# Patient Record
Sex: Female | Born: 1965
Health system: Southern US, Community
[De-identification: ages and names within clinical notes are randomized; demographics above are authoritative.]

## PROBLEM LIST (undated history)

## (undated) ENCOUNTER — Emergency Department (HOSPITAL_COMMUNITY): Admission: EM | Payer: MEDICAID | Source: Home / Self Care

## (undated) DIAGNOSIS — T7840XA Allergy, unspecified, initial encounter: Secondary | ICD-10-CM

## (undated) DIAGNOSIS — F419 Anxiety disorder, unspecified: Secondary | ICD-10-CM

## (undated) DIAGNOSIS — J439 Emphysema, unspecified: Secondary | ICD-10-CM

## (undated) DIAGNOSIS — J449 Chronic obstructive pulmonary disease, unspecified: Secondary | ICD-10-CM

## (undated) DIAGNOSIS — I10 Essential (primary) hypertension: Secondary | ICD-10-CM

## (undated) DIAGNOSIS — G9389 Other specified disorders of brain: Secondary | ICD-10-CM

## (undated) DIAGNOSIS — C801 Malignant (primary) neoplasm, unspecified: Secondary | ICD-10-CM

## (undated) HISTORY — DX: Emphysema, unspecified: J43.9

## (undated) HISTORY — PX: OTHER SURGICAL HISTORY: SHX169

## (undated) HISTORY — DX: Allergy, unspecified, initial encounter: T78.40XA

## (undated) HISTORY — DX: Chronic obstructive pulmonary disease, unspecified: J44.9

---

## 2000-07-06 ENCOUNTER — Encounter: Payer: Self-pay | Admitting: Emergency Medicine

## 2000-07-06 ENCOUNTER — Emergency Department (HOSPITAL_COMMUNITY): Admission: EM | Admit: 2000-07-06 | Discharge: 2000-07-06 | Payer: Self-pay | Admitting: Emergency Medicine

## 2007-04-17 ENCOUNTER — Emergency Department (HOSPITAL_COMMUNITY): Admission: EM | Admit: 2007-04-17 | Discharge: 2007-04-17 | Payer: Self-pay | Admitting: Emergency Medicine

## 2007-04-19 ENCOUNTER — Emergency Department (HOSPITAL_COMMUNITY): Admission: EM | Admit: 2007-04-19 | Discharge: 2007-04-19 | Payer: Self-pay | Admitting: Family Medicine

## 2013-05-10 ENCOUNTER — Ambulatory Visit: Payer: Self-pay

## 2013-06-06 ENCOUNTER — Encounter: Payer: Self-pay | Admitting: Internal Medicine

## 2013-06-06 ENCOUNTER — Ambulatory Visit: Payer: No Typology Code available for payment source | Attending: Family Medicine | Admitting: Internal Medicine

## 2013-06-06 VITALS — BP 134/82 | HR 71 | Temp 98.2°F | Resp 16 | Ht 67.0 in | Wt 117.0 lb

## 2013-06-06 DIAGNOSIS — K0889 Other specified disorders of teeth and supporting structures: Secondary | ICD-10-CM | POA: Insufficient documentation

## 2013-06-06 DIAGNOSIS — K089 Disorder of teeth and supporting structures, unspecified: Secondary | ICD-10-CM | POA: Insufficient documentation

## 2013-06-06 MED ORDER — IBUPROFEN 600 MG PO TABS: 600.0000 mg | ORAL_TABLET | Freq: Three times a day (TID) | ORAL | Status: DC | PRN

## 2013-06-06 MED ORDER — AMOXICILLIN-POT CLAVULANATE 875-125 MG PO TABS: 1.0000 | ORAL_TABLET | Freq: Two times a day (BID) | ORAL | Status: DC

## 2013-06-06 NOTE — Progress Notes (Signed)
Patient ID: Regina [name], female   DOB: December 14, 1965, 47 y.o.   MRN: [id number] Patient Demographics  Regina [name], is a 47 y.o. female  Regina [name]  NUU:[id number]  DOB - 29-Sep-1965  Chief Complaint  Patient presents with  . Establish Care        Subjective:   Regina [name] today is here to establish primary care. Patient has No headache, No chest pain, No abdominal pain - No Nausea, No new weakness tingling or numbness, No Cough - SOB.  Patient is a 47 year old female who has no medical problems, states that she is in good physical health, needs a dental referral, pain and loose first lower left lower premolar.  Objective:    Filed Vitals:   06/06/13 0909  BP: 134/82  Pulse: 71  Temp: 98.2 F (36.8 C)  TempSrc: Oral  Resp: 16  Height: 5\' 7"  (1.702 m)  Weight: 117 lb (53.071 kg)  SpO2: 100%     ALLERGIES:  Not on File  PAST MEDICAL HISTORY: No past medical history on file.  PAST SURGICAL HISTORY: No past surgical history on file.  FAMILY HISTORY: No family history on file.  MEDICATIONS AT HOME: Prior to Admission medications   Medication Sig Start Date End Date Taking? Authorizing Provider  amoxicillin-clavulanate (AUGMENTIN) 875-125 MG per tablet Take 1 tablet by mouth 2 (two) times daily. 06/06/13   Ripudeep Jenna Luo, MD  ibuprofen (ADVIL,MOTRIN) 600 MG tablet Take 1 tablet (600 mg total) by mouth every 8 (eight) hours as needed for pain. 06/06/13   Ripudeep Jenna Luo, MD    REVIEW OF SYSTEMS:  Constitutional:   No   Fevers, chills, fatigue.  HEENT:    No headaches, Sore throat, odontalgia+  Cardio-vascular: No chest pain,  Orthopnea, swelling in lower extremities, anasarca, palpitations  GI:  No abdominal pain, nausea, vomiting, diarrhea  Resp: No shortness of breath,  No coughing up of blood.No cough.No wheezing.  Skin:  no rash or lesions.  GU:  no dysuria, change in color of urine, no urgency or frequency.  No flank  pain.  Musculoskeletal: No joint pain or swelling.  No decreased range of motion.  No back pain.  Psych: No change in mood or affect. No depression or anxiety.  No memory loss.   Exam  General appearance :Awake, alert, NAD, Speech Clear. HEENT: Atraumatic and Normocephalic, PERLA poor oral hygiene, dental caries, loose left lower first premolar Neck: supple, no JVD. No cervical lymphadenopathy.  Chest: clear to auscultation bilaterally, no wheezing, rales or rhonchi CVS: S1 S2 regular, no murmurs.  Abdomen: soft, NBS, NT, ND, no gaurding, rigidity or rebound. Extremities: No cyanosis, clubbing, B/L Lower Ext shows no edema,  Neurology: Awake alert, and oriented X 3, CN II-XII intact, Non focal Skin:No Rash or lesions Wounds: N/A    Data Review   Basic Metabolic Panel: No results found for this basename: NA, K, CL, CO2, GLUCOSE, BUN, CREATININE, CALCIUM, MG, PHOS,  in the last 168 hours Liver Function Tests: No results found for this basename: AST, ALT, ALKPHOS, BILITOT, PROT, ALBUMIN,  in the last 168 hours  CBC: No results found for this basename: WBC, NEUTROABS, HGB, HCT, MCV, PLT,  in the last 168 hours ------------------------------------------------------------------------------------------------------------------ No results found for this basename: HGBA1C,  in the last 72 hours ------------------------------------------------------------------------------------------------------------------ No results found for this basename: CHOL, HDL, LDLCALC, TRIG, CHOLHDL, LDLDIRECT,  in the last 72 hours ------------------------------------------------------------------------------------------------------------------ No results found for this basename: TSH, T4TOTAL, FREET3, T3FREE, THYROIDAB,  in  the last 72 hours ------------------------------------------------------------------------------------------------------------------ No results found for this basename: VITAMINB12, FOLATE,  FERRITIN, TIBC, IRON, RETICCTPCT,  in the last 72 hours  Coagulation profile  No results found for this basename: INR, PROTIME,  in the last 168 hours    Assessment & Plan   Active Problems: Dental pain - Placed on Augmentin for 10 days, pain control with ibuprofen - Urgent ambulatory referral to dentistry    Health screening - Patient had flu shot last week - States had Pap smear 3 months ago - Mammogram screening ordered Labs for CBC, BMET, lipid panel for next visit ordered - She has eye appointment today at 10:30AM   Follow-up in 3 months   RAI,RIPUDEEP M.D. 06/06/2013, 9:15 AM

## 2013-06-06 NOTE — Progress Notes (Signed)
Pt is here to establish care. Pt is requesting a referral to a dentist.  Pt reports to be in great health.

## 2013-07-12 ENCOUNTER — Ambulatory Visit: Payer: No Typology Code available for payment source

## 2013-08-15 ENCOUNTER — Ambulatory Visit: Payer: No Typology Code available for payment source

## 2013-08-28 ENCOUNTER — Ambulatory Visit: Payer: No Typology Code available for payment source

## 2013-11-23 ENCOUNTER — Ambulatory Visit: Payer: Self-pay

## 2014-05-16 ENCOUNTER — Emergency Department (HOSPITAL_COMMUNITY)
Admission: EM | Admit: 2014-05-16 | Discharge: 2014-05-17 | Disposition: A | Discharge status: Home / Self Care | Payer: No Typology Code available for payment source | Attending: Emergency Medicine | Admitting: Emergency Medicine

## 2014-05-16 ENCOUNTER — Encounter (HOSPITAL_COMMUNITY): Payer: Self-pay | Admitting: Emergency Medicine

## 2014-05-16 ENCOUNTER — Emergency Department (HOSPITAL_COMMUNITY): Payer: No Typology Code available for payment source

## 2014-05-16 DIAGNOSIS — S139XXA Sprain of joints and ligaments of unspecified parts of neck, initial encounter: Secondary | ICD-10-CM | POA: Insufficient documentation

## 2014-05-16 DIAGNOSIS — Y9289 Other specified places as the place of occurrence of the external cause: Secondary | ICD-10-CM | POA: Insufficient documentation

## 2014-05-16 DIAGNOSIS — S060X0A Concussion without loss of consciousness, initial encounter: Secondary | ICD-10-CM | POA: Diagnosis not present

## 2014-05-16 DIAGNOSIS — S161XXA Strain of muscle, fascia and tendon at neck level, initial encounter: Secondary | ICD-10-CM

## 2014-05-16 DIAGNOSIS — Y9389 Activity, other specified: Secondary | ICD-10-CM | POA: Insufficient documentation

## 2014-05-16 DIAGNOSIS — IMO0002 Reserved for concepts with insufficient information to code with codable children: Secondary | ICD-10-CM | POA: Diagnosis present

## 2014-05-16 MED ORDER — OXYCODONE-ACETAMINOPHEN 5-325 MG PO TABS
1.0000 | ORAL_TABLET | Freq: Once | ORAL | Status: AC
  Administered 2014-05-16: 1 via ORAL
  Filled 2014-05-16: qty 1
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
## 2014-05-17 ENCOUNTER — Encounter (HOSPITAL_COMMUNITY): Payer: Self-pay | Admitting: Emergency Medicine

## 2014-05-17 ENCOUNTER — Emergency Department (HOSPITAL_COMMUNITY): Payer: No Typology Code available for payment source

## 2014-05-17 ENCOUNTER — Emergency Department (HOSPITAL_COMMUNITY)
Admission: EM | Admit: 2014-05-17 | Discharge: 2014-05-17 | Disposition: A | Discharge status: Home / Self Care | Payer: No Typology Code available for payment source | Attending: Emergency Medicine | Admitting: Emergency Medicine

## 2014-05-17 DIAGNOSIS — Y92410 Unspecified street and highway as the place of occurrence of the external cause: Secondary | ICD-10-CM | POA: Insufficient documentation

## 2014-05-17 DIAGNOSIS — S060X0A Concussion without loss of consciousness, initial encounter: Secondary | ICD-10-CM | POA: Insufficient documentation

## 2014-05-17 DIAGNOSIS — S0990XA Unspecified injury of head, initial encounter: Secondary | ICD-10-CM | POA: Insufficient documentation

## 2014-05-17 DIAGNOSIS — Z791 Long term (current) use of non-steroidal anti-inflammatories (NSAID): Secondary | ICD-10-CM | POA: Diagnosis not present

## 2014-05-17 DIAGNOSIS — M79605 Pain in left leg: Secondary | ICD-10-CM

## 2014-05-17 DIAGNOSIS — S99929A Unspecified injury of unspecified foot, initial encounter: Secondary | ICD-10-CM | POA: Diagnosis not present

## 2014-05-17 DIAGNOSIS — S8990XA Unspecified injury of unspecified lower leg, initial encounter: Secondary | ICD-10-CM | POA: Insufficient documentation

## 2014-05-17 DIAGNOSIS — Y9389 Activity, other specified: Secondary | ICD-10-CM | POA: Diagnosis not present

## 2014-05-17 DIAGNOSIS — S99919A Unspecified injury of unspecified ankle, initial encounter: Secondary | ICD-10-CM

## 2014-05-17 MED ORDER — OXYCODONE-ACETAMINOPHEN 5-325 MG PO TABS
2.0000 | ORAL_TABLET | Freq: Once | ORAL | Status: AC
  Administered 2014-05-17: 2 via ORAL
  Filled 2014-05-17: qty 2

## 2014-05-17 MED ORDER — HYDROCODONE-ACETAMINOPHEN 5-325 MG PO TABS
1.0000 | ORAL_TABLET | Freq: Four times a day (QID) | ORAL | Status: DC | PRN
Start: 2014-05-17 — End: 2014-05-28

## 2014-05-17 MED ORDER — NAPROXEN 500 MG PO TABS: 500.0000 mg | ORAL_TABLET | Freq: Two times a day (BID) | ORAL | Status: DC

## 2014-05-17 MED ORDER — CYCLOBENZAPRINE HCL 10 MG PO TABS: 10.0000 mg | ORAL_TABLET | Freq: Two times a day (BID) | ORAL | Status: DC | PRN
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
## 2014-05-17 NOTE — Discharge Instructions (Signed)
Continue taking naprosyn and flexeril.   Take vicodin for severe pain.   You are likely to have ringing and dizziness for several weeks.   Rest for several days.   Follow up with your doctor.   Return to ER if you have severe pain, vomiting.

## 2014-05-17 NOTE — ED Provider Notes (Signed)
Medical screening examination/treatment/procedure(s) were performed by non-physician practitioner and as supervising physician I was immediately available for consultation/collaboration.   EKG Interpretation None       Olivia Mackie, MD 05/17/14 [phone]

## 2014-05-20 ENCOUNTER — Encounter (HOSPITAL_COMMUNITY): Payer: Self-pay | Admitting: Emergency Medicine

## 2014-05-20 ENCOUNTER — Emergency Department (HOSPITAL_COMMUNITY)
Admission: EM | Admit: 2014-05-20 | Discharge: 2014-05-20 | Disposition: A | Discharge status: Home / Self Care | Payer: No Typology Code available for payment source | Attending: Emergency Medicine | Admitting: Emergency Medicine

## 2014-05-20 DIAGNOSIS — Z79899 Other long term (current) drug therapy: Secondary | ICD-10-CM | POA: Diagnosis not present

## 2014-05-20 DIAGNOSIS — Z791 Long term (current) use of non-steroidal anti-inflammatories (NSAID): Secondary | ICD-10-CM | POA: Insufficient documentation

## 2014-05-20 DIAGNOSIS — H9319 Tinnitus, unspecified ear: Secondary | ICD-10-CM | POA: Insufficient documentation

## 2014-05-20 DIAGNOSIS — G44309 Post-traumatic headache, unspecified, not intractable: Secondary | ICD-10-CM

## 2014-05-20 DIAGNOSIS — F0781 Postconcussional syndrome: Secondary | ICD-10-CM | POA: Insufficient documentation
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
## 2014-05-20 NOTE — ED Provider Notes (Signed)
Medical screening examination/treatment/procedure(s) were performed by non-physician practitioner and as supervising physician I was immediately available for consultation/collaboration.  Bayne Fosnaugh, MD 05/20/14 1607 
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
## 2014-05-27 ENCOUNTER — Ambulatory Visit: Payer: Self-pay | Admitting: Family Medicine

## 2014-05-28 ENCOUNTER — Ambulatory Visit: Payer: No Typology Code available for payment source | Attending: Family Medicine | Admitting: Family Medicine

## 2014-05-28 ENCOUNTER — Encounter: Payer: Self-pay | Admitting: Family Medicine

## 2014-05-28 VITALS — BP 142/89 | HR 85 | Temp 98.4°F | Resp 18 | Ht 66.0 in | Wt 109.8 lb

## 2014-05-28 DIAGNOSIS — R51 Headache: Secondary | ICD-10-CM | POA: Diagnosis not present

## 2014-05-28 DIAGNOSIS — M25569 Pain in unspecified knee: Secondary | ICD-10-CM | POA: Insufficient documentation

## 2014-05-28 DIAGNOSIS — R030 Elevated blood-pressure reading, without diagnosis of hypertension: Secondary | ICD-10-CM

## 2014-05-28 DIAGNOSIS — F0781 Postconcussional syndrome: Secondary | ICD-10-CM | POA: Diagnosis not present

## 2014-05-28 DIAGNOSIS — IMO0001 Reserved for inherently not codable concepts without codable children: Secondary | ICD-10-CM | POA: Insufficient documentation
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
## 2014-05-28 NOTE — Patient Instructions (Signed)
Ms. Surita,  Thank you for coming in today. It was a pleasure meeting you.   Please return to work at half duty with restrictions tomorrow with a plan to return to full duty w/o restrictions on 06/05/14.    Follow up as needed.  I am available to be your primary doctor if you like if so schedule a f/u physical with pap if you are due and we will focus on health maintenance.   Dr. Armen Pickup
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
## 2014-05-28 NOTE — Assessment & Plan Note (Signed)
A: BP elevated here and at ED in the setting of pain P: f/u at physical if patient chooses to establish care

## 2014-06-03 ENCOUNTER — Telehealth: Payer: Self-pay | Admitting: Family Medicine

## 2014-06-03 NOTE — Telephone Encounter (Signed)
Pt. Called stating that she is still having a headache. Pt would like some advice on what to do next. Please f/u with pt.

## 2014-06-27 ENCOUNTER — Telehealth: Payer: Self-pay | Admitting: Family Medicine

## 2014-06-27 NOTE — Telephone Encounter (Signed)
Pt.called stating that she is still having headaches and would like to speak to nurse. Please f/u with pt.

## 2014-07-01 ENCOUNTER — Ambulatory Visit: Payer: Self-pay | Admitting: Family Medicine

## 2014-07-01 ENCOUNTER — Telehealth: Payer: Self-pay | Admitting: Emergency Medicine

## 2014-07-01 NOTE — Telephone Encounter (Signed)
Pt has scheduled appt with Dr. Funches10/27/15 for neck pain

## 2014-07-02 ENCOUNTER — Ambulatory Visit: Payer: Self-pay | Admitting: Family Medicine

## 2014-07-11 ENCOUNTER — Emergency Department (INDEPENDENT_AMBULATORY_CARE_PROVIDER_SITE_OTHER)
Admission: EM | Admit: 2014-07-11 | Discharge: 2014-07-11 | Disposition: A | Discharge status: Home / Self Care | Payer: Self-pay | Source: Home / Self Care

## 2014-07-11 ENCOUNTER — Encounter (HOSPITAL_COMMUNITY): Payer: Self-pay | Admitting: *Deleted

## 2014-07-11 DIAGNOSIS — S161XXD Strain of muscle, fascia and tendon at neck level, subsequent encounter: Secondary | ICD-10-CM

## 2014-07-11 DIAGNOSIS — S060X1D Concussion with loss of consciousness of 30 minutes or less, subsequent encounter: Secondary | ICD-10-CM

## 2014-07-11 DIAGNOSIS — S46011D Strain of muscle(s) and tendon(s) of the rotator cuff of right shoulder, subsequent encounter: Secondary | ICD-10-CM

## 2014-07-11 MED ORDER — MELOXICAM 15 MG PO TABS: 15.0000 mg | ORAL_TABLET | Freq: Every day | ORAL | Status: DC

## 2014-07-11 MED ORDER — METHOCARBAMOL 500 MG PO TABS: 500.0000 mg | ORAL_TABLET | Freq: Three times a day (TID) | ORAL | Status: DC

## 2014-07-11 NOTE — Discharge Instructions (Signed)
Most shoulder pain is caused by soft tissue problems rather than arthritis.  Rotator cuff tendonitis or tendonosis, rotator cuff tears, impingement syndrome and cartilege (labrum tears) are a few of the common causes of shoulder pain.  Fortunately, most of these can be treated with conservative measures as outlined below. ° °Do not do the following: °· Doing any work with the arms above shoulder level (especially lifting) until the pain has subsided. °· Sleeping on the affected side.  Especially avoid sleeping with your arm under your head or your pillow.  This is a habit that is hard to break.  Some people have to pin the arm of their pajamas to the chest area to prevent this. ° °Do the following: °· Do the shoulder exercises below twice daily followed by ice for 10 minutes. °· If no better in 1 month, follow up here, with your primary care doctor, or with an orthopedist. °· Use of over the counter pain meds can be of help.  Tylenol (or acetaminophen) is the safest to use.  It often helps to take this regularly.  You can take up to 2 325 mg tablets 5 times daily, but it best to start out much lower that that, perhaps 2 325 mg tablets twice daily, then increase from there. People who are on the blood thinner warfarin have to be careful about taking high doses of Tylenol.  For people who are able to tolerate them, ibuprofen and Aleve can also help with the pain.  You should discuss these agents with your physician before taking them.  People with chronic kidney disease, hypertension, peptic ulcer disease, and reflux can suffer adverse side effects. They should not be taken with warfarin. The maximum dosage of ibuprofen is 800 mg 3 times daily with meals.  The maximum dosage of Aleve is 2 and 1/2 tablets twice daily with food, but again, start out low and gradually increase the dose until adequate pain relief is achieved. Ibuprofen and Aleve should always be taken with food. ° ° ° ° ° °TREATMENT  °Treatment initially  involves the use of ice and medication to help reduce pain and inflammation. It is also important to perform strengthening and stretching exercises and modify activities that worsen symptoms so the injury does not get worse. These exercises may be performed at home or with a therapist. For patients who experience severe symptoms, a soft padded collar may be recommended to be worn around the neck.  °Improving your posture may help reduce symptoms. Posture improvement includes pulling your chin and abdomen in while sitting or standing. If you are sitting, sit in a firm chair with your buttocks against the back of the chair. While sleeping, try replacing your pillow with a small towel rolled to 2 inches in diameter, or use a cervical pillow. Poor sleeping positions delay healing.  ° °MEDICATION  °· If pain medication is necessary, nonsteroidal anti-inflammatory medications, such as aspirin and ibuprofen, or other minor pain relievers, such as acetaminophen, are often recommended. °· Do not take pain medication for 7 days before surgery. °· Prescription pain relievers may be given if deemed necessary by your caregiver. Use only as directed and only as much as you need. ° °HEAT AND COLD:  °· Cold treatment (icing) relieves pain and reduces inflammation. Cold treatment should be applied for 10 to 15 minutes every 2 to 3 hours for inflammation and pain and immediately after any activity that aggravates your symptoms. Use ice packs or an ice massage. °·   Heat treatment may be used prior to performing the stretching and strengthening activities prescribed by your caregiver, physical therapist, or athletic trainer. Use a heat pack or a warm soak. ° °SEEK MEDICAL CARE IF:  °· Symptoms get worse or do not improve in 2 weeks despite treatment. °· New, unexplained symptoms develop (drugs used in treatment may produce side effects). ° °EXERCISES °RANGE OF MOTION (ROM) AND STRETCHING EXERCISES - Cervical Strain and Sprain °These  exercises may help you when beginning to rehabilitate your injury. In order to successfully resolve your symptoms, you must improve your posture. These exercises are designed to help reduce the forward-head and rounded-shoulder posture which contributes to this condition. Your symptoms may resolve with or without further involvement from your physician, physical therapist or athletic trainer. While completing these exercises, remember:  °· Restoring tissue flexibility helps normal motion to return to the joints. This allows healthier, less painful movement and activity. °· An effective stretch should be held for at least 20 seconds, although you may need to begin with shorter hold times for comfort. °· A stretch should never be painful. You should only feel a gentle lengthening or release in the stretched tissue. ° °STRETCH- Axial Extensors °· Lie on your back on the floor. You may bend your knees for comfort. Place a rolled up hand towel or dish towel, about 2 inches in diameter, under the part of your head that makes contact with the floor. °· Gently tuck your chin, as if trying to make a "double chin," until you feel a gentle stretch at the base of your head. °· Hold _____10_____ seconds. °Repeat _____10_____ times. Complete this exercise _____2_____ times per day.  ° °STRETECH - Axial Extension  °· Stand or sit on a firm surface. Assume a good posture: chest up, shoulders drawn back, abdominal muscles slightly tense, knees unlocked (if standing) and feet hip width apart. °· Slowly retract your chin so your head slides back and your chin slightly lowers.Continue to look straight ahead. °· You should feel a gentle stretch in the back of your head. Be certain not to feel an aggressive stretch since this can cause headaches later. °· Hold for ____10______ seconds. °Repeat _____10_____ times. Complete this exercise ____2______ times per day. ° °STRETCH  Cervical Side Bend  °· Stand or sit on a firm surface. Assume a  good posture: chest up, shoulders drawn back, abdominal muscles slightly tense, knees unlocked (if standing) and feet hip width apart. °· Without letting your nose or shoulders move, slowly tip your right / left ear to your shoulder until your feel a gentle stretch in the muscles on the opposite side of your neck. °· Hold _____10_____ seconds. °Repeat _____10_____ times. Complete this exercise _____2_____ times per day. ° °STRETCH  Cervical Rotators  °· Stand or sit on a firm surface. Assume a good posture: chest up, shoulders drawn back, abdominal muscles slightly tense, knees unlocked (if standing) and feet hip width apart. °· Keeping your eyes level with the ground, slowly turn your head until you feel a gentle stretch along the back and opposite side of your neck. °· Hold _____10_____ seconds. °Repeat ____10______ times. Complete this exercise ____2______ times per day. ° °RANGE OF MOTION - Neck Circles  °· Stand or sit on a firm surface. Assume a good posture: chest up, shoulders drawn back, abdominal muscles slightly tense, knees unlocked (if standing) and feet hip width apart. °· Gently roll your head down and around from the back of one   shoulder to the back of the other. The motion should never be forced or painful. °· Repeat the motion 10-20 times, or until you feel the neck muscles relax and loosen. °Repeat ____10______ times. Complete the exercise _____2_____ times per day. ° °STRENGTHENING EXERCISES - Cervical Strain and Sprain °These exercises may help you when beginning to rehabilitate your injury. They may resolve your symptoms with or without further involvement from your physician, physical therapist or athletic trainer. While completing these exercises, remember:  °· Muscles can gain both the endurance and the strength needed for everyday activities through controlled exercises. °· Complete these exercises as instructed by your physician, physical therapist or athletic trainer. Progress the  resistance and repetitions only as guided. °· You may experience muscle soreness or fatigue, but the pain or discomfort you are trying to eliminate should never worsen during these exercises. If this pain does worsen, stop and make certain you are following the directions exactly. If the pain is still present after adjustments, discontinue the exercise until you can discuss the trouble with your clinician. ° °STRENGTH Cervical Flexors, Isometric °· Face a wall, standing about 6 inches away. Place a small pillow, a ball about 6-8 inches in diameter, or a folded towel between your forehead and the wall. °· Slightly tuck your chin and gently push your forehead into the soft object. Push only with mild to moderate intensity, building up tension gradually. Keep your jaw and forehead relaxed. °· Hold 10 to 20 seconds. Keep your breathing relaxed. °· Release the tension slowly. Relax your neck muscles completely before you start the next repetition. °Repeat _____10_____ times. Complete this exercise _____2_____ times per day. ° °STRENGTH- Cervical Lateral Flexors, Isometric  °· Stand about 6 inches away from a wall. Place a small pillow, a ball about 6-8 inches in diameter, or a folded towel between the side of your head and the wall. °· Slightly tuck your chin and gently tilt your head into the soft object. Push only with mild to moderate intensity, building up tension gradually. Keep your jaw and forehead relaxed. °· Hold 10 to 20 seconds. Keep your breathing relaxed. °· Release the tension slowly. Relax your neck muscles completely before you start the next repetition. °Repeat _____10_____ times. Complete this exercise ____2______ times per day. ° °STRENGTH  Cervical Extensors, Isometric  °· Stand about 6 inches away from a wall. Place a small pillow, a ball about 6-8 inches in diameter, or a folded towel between the back of your head and the wall. °· Slightly tuck your chin and gently tilt your head back into the soft  object. Push only with mild to moderate intensity, building up tension gradually. Keep your jaw and forehead relaxed. °· Hold 10 to 20 seconds. Keep your breathing relaxed. °· Release the tension slowly. Relax your neck muscles completely before you start the next repetition. °Repeat _____10_____ times. Complete this exercise _____2_____ times per day. ° °POSTURE AND BODY MECHANICS CONSIDERATIONS - Cervical Strain and Sprain °Keeping correct posture when sitting, standing or completing your activities will reduce the stress put on different body tissues, allowing injured tissues a chance to heal and limiting painful experiences. The following are general guidelines for improved posture. Your physician or physical therapist will provide you with any instructions specific to your needs. While reading these guidelines, remember: °· The exercises prescribed by your provider will help you have the flexibility and strength to maintain correct postures. °· The correct posture provides the optimal environment for your joints   to work. All of your joints have less wear and tear when properly supported by a spine with good posture. This means you will experience a healthier, less painful body. °· Correct posture must be practiced with all of your activities, especially prolonged sitting and standing. Correct posture is as important when doing repetitive low-stress activities (typing) as it is when doing a single heavy-load activity (lifting). °PROLONGED STANDING WHILE SLIGHTLY LEANING FORWARD °When completing a task that requires you to lean forward while standing in one place for a long time, place either foot up on a stationary 2-4 inch high object to help maintain the best posture. When both feet are on the ground, the low back tends to lose its slight inward curve. If this curve flattens (or becomes too large), then the back and your other joints will experience too much stress, fatigue more quickly and can cause pain.    °RESTING POSITIONS °Consider which positions are most painful for you when choosing a resting position. If you have pain with flexion-based activities (sitting, bending, stooping, squatting), choose a position that allows you to rest in a less flexed posture. You would want to avoid curling into a fetal position on your side. If your pain worsens with extension-based activities (prolonged standing, working overhead), avoid resting in an extended position such as sleeping on your stomach. Most people will find more comfort when they rest with their spine in a more neutral position, neither too rounded nor too arched. Lying on a non-sagging bed on your side with a pillow between your knees, or on your back with a pillow under your knees will often provide some relief. Keep in mind, being in any one position for a prolonged period of time, no matter how correct your posture, can still lead to stiffness. °WALKING °Walk with an upright posture. Your ears, shoulders and hips should all line-up. °OFFICE WORK °When working at a desk, create an environment that supports good, upright posture. Without extra support, muscles fatigue and lead to excessive strain on joints and other tissues. °CHAIR: °· A chair should be able to slide under your desk when your back makes contact with the back of the chair. This allows you to work closely. °· The chair's height should allow your eyes to be level with the upper part of your monitor and your hands to be slightly lower than your elbows. °· Body position: °· Your feet should make contact with the floor. If this is not possible, use a foot rest. °· Keep your ears over your shoulders. This will reduce stress on your neck and low back. °Document Released: 08/23/2005 Document Revised: 11/15/2011 Document Reviewed: 12/05/2008 °[employer]® Patient Information ©2013 [employer]. ° ° °
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
## 2014-07-18 ENCOUNTER — Ambulatory Visit: Payer: No Typology Code available for payment source | Attending: Family Medicine | Admitting: Family Medicine

## 2014-07-18 ENCOUNTER — Encounter: Payer: Self-pay | Admitting: Family Medicine

## 2014-07-18 VITALS — BP 115/71 | HR 90 | Temp 98.4°F | Resp 18 | Ht 66.0 in | Wt 120.0 lb

## 2014-07-18 DIAGNOSIS — R030 Elevated blood-pressure reading, without diagnosis of hypertension: Secondary | ICD-10-CM | POA: Insufficient documentation

## 2014-07-18 DIAGNOSIS — IMO0001 Reserved for inherently not codable concepts without codable children: Secondary | ICD-10-CM

## 2014-07-18 DIAGNOSIS — M25511 Pain in right shoulder: Secondary | ICD-10-CM | POA: Insufficient documentation

## 2014-07-18 NOTE — Assessment & Plan Note (Signed)
Right shoulder rotator cuff injury:  MRI ordered Please apply for [place] discount and orange card prior to it being scheduled. Referral to sports medicine will be processed once you apply and qualify for [place] discount and orange card.  Continue muscle relaxer and NSAID prn
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
## 2014-07-18 NOTE — Patient Instructions (Addendum)
Ms. Regina [name],  Right shoulder rotator cuff injury:  MRI ordered Please apply for [employer] discount and orange card prior to it being scheduled. Referral to sports medicine will be processed once you apply and qualify for [place] discount and orange card.   Dr. Armen PickupFunches   Impingement Syndrome, Rotator Cuff, Bursitis with Rehab Impingement syndrome is a condition that involves inflammation of the tendons of the rotator cuff and the subacromial bursa, that causes pain in the shoulder. The rotator cuff consists of four tendons and muscles that control much of the shoulder and upper arm function. The subacromial bursa is a fluid filled sac that helps reduce friction between the rotator cuff and one of the bones of the shoulder (acromion). Impingement syndrome is usually an overuse injury that causes swelling of the bursa (bursitis), swelling of the tendon (tendonitis), and/or a tear of the tendon (strain). Strains are classified into three categories. Grade 1 strains cause pain, but the tendon is not lengthened. Grade 2 strains include a lengthened ligament, due to the ligament being stretched or partially ruptured. With grade 2 strains there is still function, although the function may be decreased. Grade 3 strains include a complete tear of the tendon or muscle, and function is usually impaired. SYMPTOMS   Pain around the shoulder, often at the outer portion of the upper arm.  Pain that gets worse with shoulder function, especially when reaching overhead or lifting.  Sometimes, aching when not using the arm.  Pain that wakes you up at night.  Sometimes, tenderness, swelling, warmth, or redness over the affected area.  Loss of strength.  Limited motion of the shoulder, especially reaching behind the back (to the back pocket or to unhook bra) or across your body.  Crackling sound (crepitation) when moving the arm.  Biceps tendon pain and inflammation (in the front of the shoulder). Worse  when bending the elbow or lifting. CAUSES  Impingement syndrome is often an overuse injury, in which chronic (repetitive) motions cause the tendons or bursa to become inflamed. A strain occurs when a force is paced on the tendon or muscle that is greater than it can withstand. Common mechanisms of injury include: Stress from sudden increase in duration, frequency, or intensity of training.  Direct hit (trauma) to the shoulder.  Aging, erosion of the tendon with normal use.  Bony bump on shoulder (acromial spur). RISK INCREASES WITH:  Contact sports (football, wrestling, boxing).  Throwing sports (baseball, tennis, volleyball).  Weightlifting and bodybuilding.  Heavy labor.  Previous injury to the rotator cuff, including impingement.  Poor shoulder strength and flexibility.  Failure to warm up properly before activity.  Inadequate protective equipment.  Old age.  Bony bump on shoulder (acromial spur). PREVENTION   Warm up and stretch properly before activity.  Allow for adequate recovery between workouts.  Maintain physical fitness:  Strength, flexibility, and endurance.  Cardiovascular fitness.  Learn and use proper exercise technique. PROGNOSIS  If treated properly, impingement syndrome usually goes away within 6 weeks. Sometimes surgery is required.  RELATED COMPLICATIONS   Longer healing time if not properly treated, or if not given enough time to heal.  Recurring symptoms, that result in a chronic condition.  Shoulder stiffness, frozen shoulder, or loss of motion.  Rotator cuff tendon tear.  Recurring symptoms, especially if activity is resumed too soon, with overuse, with a direct blow, or when using poor technique. TREATMENT  Treatment first involves the use of ice and medicine, to reduce pain and  inflammation. The use of strengthening and stretching exercises may help reduce pain with activity. These exercises may be performed at home or with a  therapist. If non-surgical treatment is unsuccessful after more than 6 months, surgery may be advised. After surgery and rehabilitation, activity is usually possible in 3 months.  MEDICATION  If pain medicine is needed, nonsteroidal anti-inflammatory medicines (aspirin and ibuprofen), or other minor pain relievers (acetaminophen), are often advised.  Do not take pain medicine for 7 days before surgery.  Prescription pain relievers may be given, if your caregiver thinks they are needed. Use only as directed and only as much as you need.  Corticosteroid injections may be given by your caregiver. These injections should be reserved for the most serious cases, because they may only be given a certain number of times. HEAT AND COLD  Cold treatment (icing) should be applied for 10 to 15 minutes every 2 to 3 hours for inflammation and pain, and immediately after activity that aggravates your symptoms. Use ice packs or an ice massage.  Heat treatment may be used before performing stretching and strengthening activities prescribed by your caregiver, physical therapist, or athletic trainer. Use a heat pack or a warm water soak. SEEK MEDICAL CARE IF:   Symptoms get worse or do not improve in 4 to 6 weeks, despite treatment.  New, unexplained symptoms develop. (Drugs used in treatment may produce side effects.) EXERCISES  RANGE OF MOTION (ROM) AND STRETCHING EXERCISES - Impingement Syndrome (Rotator Cuff  Tendinitis, Bursitis) These exercises may help you when beginning to rehabilitate your injury. Your symptoms may go away with or without further involvement from your physician, physical therapist or athletic trainer. While completing these exercises, remember:   Restoring tissue flexibility helps normal motion to return to the joints. This allows healthier, less painful movement and activity.  An effective stretch should be held for at least 30 seconds.  A stretch should never be painful. You  should only feel a gentle lengthening or release in the stretched tissue. STRETCH - Flexion, Standing  Stand with good posture. With an underhand grip on your right / left hand, and an overhand grip on the opposite hand, grasp a broomstick or cane so that your hands are a little more than shoulder width apart.  Keeping your right / left elbow straight and shoulder muscles relaxed, push the stick with your opposite hand, to raise your right / left arm in front of your body and then overhead. Raise your arm until you feel a stretch in your right / left shoulder, but before you have increased shoulder pain.  Try to avoid shrugging your right / left shoulder as your arm rises, by keeping your shoulder blade tucked down and toward your mid-back spine. Hold for __________ seconds.  Slowly return to the starting position. Repeat __________ times. Complete this exercise __________ times per day. STRETCH - Abduction, Supine  Lie on your back. With an underhand grip on your right / left hand and an overhand grip on the opposite hand, grasp a broomstick or cane so that your hands are a little more than shoulder width apart.  Keeping your right / left elbow straight and your shoulder muscles relaxed, push the stick with your opposite hand, to raise your right / left arm out to the side of your body and then overhead. Raise your arm until you feel a stretch in your right / left shoulder, but before you have increased shoulder pain.  Try to avoid shrugging  your right / left shoulder as your arm rises, by keeping your shoulder blade tucked down and toward your mid-back spine. Hold for __________ seconds.  Slowly return to the starting position. Repeat __________ times. Complete this exercise __________ times per day. ROM - Flexion, Active-Assisted  Lie on your back. You may bend your knees for comfort.  Grasp a broomstick or cane so your hands are about shoulder width apart. Your right / left hand should  grip the end of the stick, so that your hand is positioned "thumbs-up," as if you were about to shake hands.  Using your healthy arm to lead, raise your right / left arm overhead, until you feel a gentle stretch in your shoulder. Hold for __________ seconds.  Use the stick to assist in returning your right / left arm to its starting position. Repeat __________ times. Complete this exercise __________ times per day.  ROM - Internal Rotation, Supine   Lie on your back on a firm surface. Place your right / left elbow about 60 degrees away from your side. Elevate your elbow with a folded towel, so that the elbow and shoulder are the same height.  Using a broomstick or cane and your strong arm, pull your right / left hand toward your body until you feel a gentle stretch, but no increase in your shoulder pain. Keep your shoulder and elbow in place throughout the exercise.  Hold for __________ seconds. Slowly return to the starting position. Repeat __________ times. Complete this exercise __________ times per day. STRETCH - Internal Rotation  Place your right / left hand behind your back, palm up.  Throw a towel or belt over your opposite shoulder. Grasp the towel with your right / left hand.  While keeping an upright posture, gently pull up on the towel, until you feel a stretch in the front of your right / left shoulder.  Avoid shrugging your right / left shoulder as your arm rises, by keeping your shoulder blade tucked down and toward your mid-back spine.  Hold for __________ seconds. Release the stretch, by lowering your healthy hand. Repeat __________ times. Complete this exercise __________ times per day. ROM - Internal Rotation   Using an underhand grip, grasp a stick behind your back with both hands.  While standing upright with good posture, slide the stick up your back until you feel a mild stretch in the front of your shoulder.  Hold for __________ seconds. Slowly return to your  starting position. Repeat __________ times. Complete this exercise __________ times per day.  STRETCH - Posterior Shoulder Capsule   Stand or sit with good posture. Grasp your right / left elbow and draw it across your chest, keeping it at the same height as your shoulder.  Pull your elbow, so your upper arm comes in closer to your chest. Pull until you feel a gentle stretch in the back of your shoulder.  Hold for __________ seconds. Repeat __________ times. Complete this exercise __________ times per day. STRENGTHENING EXERCISES - Impingement Syndrome (Rotator Cuff Tendinitis, Bursitis) These exercises may help you when beginning to rehabilitate your injury. They may resolve your symptoms with or without further involvement from your physician, physical therapist or athletic trainer. While completing these exercises, remember:  Muscles can gain both the endurance and the strength needed for everyday activities through controlled exercises.  Complete these exercises as instructed by your physician, physical therapist or athletic trainer. Increase the resistance and repetitions only as guided.  You may experience  muscle soreness or fatigue, but the pain or discomfort you are trying to eliminate should never worsen during these exercises. If this pain does get worse, stop and make sure you are following the directions exactly. If the pain is still present after adjustments, discontinue the exercise until you can discuss the trouble with your clinician.  During your recovery, avoid activity or exercises which involve actions that place your injured hand or elbow above your head or behind your back or head. These positions stress the tissues which you are trying to heal. STRENGTH - Scapular Depression and Adduction   With good posture, sit on a firm chair. Support your arms in front of you, with pillows, arm rests, or on a table top. Have your elbows in line with the sides of your body.  Gently  draw your shoulder blades down and toward your mid-back spine. Gradually increase the tension, without tensing the muscles along the top of your shoulders and the back of your neck.  Hold for __________ seconds. Slowly release the tension and relax your muscles completely before starting the next repetition.  After you have practiced this exercise, remove the arm support and complete the exercise in standing as well as sitting position. Repeat __________ times. Complete this exercise __________ times per day.  STRENGTH - Shoulder Abductors, Isometric  With good posture, stand or sit about 4-6 inches from a wall, with your right / left side facing the wall.  Bend your right / left elbow. Gently press your right / left elbow into the wall. Increase the pressure gradually, until you are pressing as hard as you can, without shrugging your shoulder or increasing any shoulder discomfort.  Hold for __________ seconds.  Release the tension slowly. Relax your shoulder muscles completely before you begin the next repetition. Repeat __________ times. Complete this exercise __________ times per day.  STRENGTH - External Rotators, Isometric  Keep your right / left elbow at your side and bend it 90 degrees.  Step into a door frame so that the outside of your right / left wrist can press against the door frame without your upper arm leaving your side.  Gently press your right / left wrist into the door frame, as if you were trying to swing the back of your hand away from your stomach. Gradually increase the tension, until you are pressing as hard as you can, without shrugging your shoulder or increasing any shoulder discomfort.  Hold for __________ seconds.  Release the tension slowly. Relax your shoulder muscles completely before you begin the next repetition. Repeat __________ times. Complete this exercise __________ times per day.  STRENGTH - Supraspinatus   Stand or sit with good posture. Grasp a  __________ weight, or an exercise band or tubing, so that your hand is "thumbs-up," like you are shaking hands.  Slowly lift your right / left arm in a "V" away from your thigh, diagonally into the space between your side and straight ahead. Lift your hand to shoulder height or as far as you can, without increasing any shoulder pain. At first, many people do not lift their hands above shoulder height.  Avoid shrugging your right / left shoulder as your arm rises, by keeping your shoulder blade tucked down and toward your mid-back spine.  Hold for __________ seconds. Control the descent of your hand, as you slowly return to your starting position. Repeat __________ times. Complete this exercise __________ times per day.  STRENGTH - External Rotators  Secure a rubber  exercise band or tubing to a fixed object (table, pole) so that it is at the same height as your right / left elbow when you are standing or sitting on a firm surface.  Stand or sit so that the secured exercise band is at your uninjured side.  Bend your right / left elbow 90 degrees. Place a folded towel or small pillow under your right / left arm, so that your elbow is a few inches away from your side.  Keeping the tension on the exercise band, pull it away from your body, as if pivoting on your elbow. Be sure to keep your body steady, so that the movement is coming only from your rotating shoulder.  Hold for __________ seconds. Release the tension in a controlled manner, as you return to the starting position. Repeat __________ times. Complete this exercise __________ times per day.  STRENGTH - Internal Rotators   Secure a rubber exercise band or tubing to a fixed object (table, pole) so that it is at the same height as your right / left elbow when you are standing or sitting on a firm surface.  Stand or sit so that the secured exercise band is at your right / left side.  Bend your elbow 90 degrees. Place a folded towel or  small pillow under your right / left arm so that your elbow is a few inches away from your side.  Keeping the tension on the exercise band, pull it across your body, toward your stomach. Be sure to keep your body steady, so that the movement is coming only from your rotating shoulder.  Hold for __________ seconds. Release the tension in a controlled manner, as you return to the starting position. Repeat __________ times. Complete this exercise __________ times per day.  STRENGTH - Scapular Protractors, Standing   Stand arms length away from a wall. Place your hands on the wall, keeping your elbows straight.  Begin by dropping your shoulder blades down and toward your mid-back spine.  To strengthen your protractors, keep your shoulder blades down, but slide them forward on your rib cage. It will feel as if you are lifting the back of your rib cage away from the wall. This is a subtle motion and can be challenging to complete. Ask your caregiver for further instruction, if you are not sure you are doing the exercise correctly.  Hold for __________ seconds. Slowly return to the starting position, resting the muscles completely before starting the next repetition. Repeat __________ times. Complete this exercise __________ times per day. STRENGTH - Scapular Protractors, Supine  Lie on your back on a firm surface. Extend your right / left arm straight into the air while holding a __________ weight in your hand.  Keeping your head and back in place, lift your shoulder off the floor.  Hold for __________ seconds. Slowly return to the starting position, and allow your muscles to relax completely before starting the next repetition. Repeat __________ times. Complete this exercise __________ times per day. STRENGTH - Scapular Protractors, Quadruped  Get onto your hands and knees, with your shoulders directly over your hands (or as close as you can be, comfortably).  Keeping your elbows locked, lift  the back of your rib cage up into your shoulder blades, so your mid-back rounds out. Keep your neck muscles relaxed.  Hold this position for __________ seconds. Slowly return to the starting position and allow your muscles to relax completely before starting the next repetition. Repeat __________ times.  Complete this exercise __________ times per day.  STRENGTH - Scapular Retractors  Secure a rubber exercise band or tubing to a fixed object (table, pole), so that it is at the height of your shoulders when you are either standing, or sitting on a firm armless chair.  With a palm down grip, grasp an end of the band in each hand. Straighten your elbows and lift your hands straight in front of you, at shoulder height. Step back, away from the secured end of the band, until it becomes tense.  Squeezing your shoulder blades together, draw your elbows back toward your sides, as you bend them. Keep your upper arms lifted away from your body throughout the exercise.  Hold for __________ seconds. Slowly ease the tension on the band, as you reverse the directions and return to the starting position. Repeat __________ times. Complete this exercise __________ times per day. STRENGTH - Shoulder Extensors   Secure a rubber exercise band or tubing to a fixed object (table, pole) so that it is at the height of your shoulders when you are either standing, or sitting on a firm armless chair.  With a thumbs-up grip, grasp an end of the band in each hand. Straighten your elbows and lift your hands straight in front of you, at shoulder height. Step back, away from the secured end of the band, until it becomes tense.  Squeezing your shoulder blades together, pull your hands down to the sides of your thighs. Do not allow your hands to go behind you.  Hold for __________ seconds. Slowly ease the tension on the band, as you reverse the directions and return to the starting position. Repeat __________ times. Complete  this exercise __________ times per day.  STRENGTH - Scapular Retractors and External Rotators   Secure a rubber exercise band or tubing to a fixed object (table, pole) so that it is at the height as your shoulders, when you are either standing, or sitting on a firm armless chair.  With a palm down grip, grasp an end of the band in each hand. Bend your elbows 90 degrees and lift your elbows to shoulder height, at your sides. Step back, away from the secured end of the band, until it becomes tense.  Squeezing your shoulder blades together, rotate your shoulders so that your upper arms and elbows remain stationary, but your fists travel upward to head height.  Hold for __________ seconds. Slowly ease the tension on the band, as you reverse the directions and return to the starting position. Repeat __________ times. Complete this exercise __________ times per day.  STRENGTH - Scapular Retractors and External Rotators, Rowing   Secure a rubber exercise band or tubing to a fixed object (table, pole) so that it is at the height of your shoulders, when you are either standing, or sitting on a firm armless chair.  With a palm down grip, grasp an end of the band in each hand. Straighten your elbows and lift your hands straight in front of you, at shoulder height. Step back, away from the secured end of the band, until it becomes tense.  Step 1: Squeeze your shoulder blades together. Bending your elbows, draw your hands to your chest, as if you are rowing a boat. At the end of this motion, your hands and elbow should be at shoulder height and your elbows should be out to your sides.  Step 2: Rotate your shoulders, to raise your hands above your head. Your forearms should be  vertical and your upper arms should be horizontal.  Hold for __________ seconds. Slowly ease the tension on the band, as you reverse the directions and return to the starting position. Repeat __________ times. Complete this exercise  __________ times per day.  STRENGTH - Scapular Depressors  Find a sturdy chair without wheels, such as a dining room chair.  Keeping your feet on the floor, and your hands on the chair arms, lift your bottom up from the seat, and lock your elbows.  Keeping your elbows straight, allow gravity to pull your body weight down. Your shoulders will rise toward your ears.  Raise your body against gravity by drawing your shoulder blades down your back, shortening the distance between your shoulders and ears. Although your feet should always maintain contact with the floor, your feet should progressively support less body weight, as you get stronger.  Hold for __________ seconds. In a controlled and slow manner, lower your body weight to begin the next repetition. Repeat __________ times. Complete this exercise __________ times per day.  Document Released: 08/23/2005 Document Revised: 11/15/2011 Document Reviewed: 12/05/2008 [facility] Patient Information 2015 [place], [place]. This information is not intended to replace advice given to you by your health care provider. Make sure you discuss any questions you have with your health care provider.
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
## 2014-07-18 NOTE — Assessment & Plan Note (Signed)
nomotensive x 2 separate readings. Will resolve problem

## 2014-07-23 ENCOUNTER — Emergency Department (HOSPITAL_COMMUNITY): Payer: Self-pay

## 2014-07-23 ENCOUNTER — Emergency Department (HOSPITAL_COMMUNITY)
Admission: EM | Admit: 2014-07-23 | Discharge: 2014-07-23 | Disposition: A | Discharge status: Home / Self Care | Payer: Self-pay | Attending: Emergency Medicine | Admitting: Emergency Medicine

## 2014-07-23 ENCOUNTER — Encounter (HOSPITAL_COMMUNITY): Payer: Self-pay | Admitting: Adult Health

## 2014-07-23 DIAGNOSIS — Z79899 Other long term (current) drug therapy: Secondary | ICD-10-CM | POA: Insufficient documentation

## 2014-07-23 DIAGNOSIS — W19XXXA Unspecified fall, initial encounter: Secondary | ICD-10-CM

## 2014-07-23 DIAGNOSIS — Y998 Other external cause status: Secondary | ICD-10-CM | POA: Insufficient documentation

## 2014-07-23 DIAGNOSIS — S20212A Contusion of left front wall of thorax, initial encounter: Secondary | ICD-10-CM

## 2014-07-23 DIAGNOSIS — S2020XA Contusion of thorax, unspecified, initial encounter: Secondary | ICD-10-CM | POA: Insufficient documentation

## 2014-07-23 DIAGNOSIS — Y9389 Activity, other specified: Secondary | ICD-10-CM | POA: Insufficient documentation

## 2014-07-23 DIAGNOSIS — Y929 Unspecified place or not applicable: Secondary | ICD-10-CM | POA: Insufficient documentation

## 2014-07-23 DIAGNOSIS — Z791 Long term (current) use of non-steroidal anti-inflammatories (NSAID): Secondary | ICD-10-CM | POA: Insufficient documentation

## 2014-07-23 DIAGNOSIS — W109XXA Fall (on) (from) unspecified stairs and steps, initial encounter: Secondary | ICD-10-CM | POA: Insufficient documentation

## 2014-07-23 DIAGNOSIS — S3992XA Unspecified injury of lower back, initial encounter: Secondary | ICD-10-CM | POA: Insufficient documentation

## 2014-07-23 MED ORDER — ACETAMINOPHEN 325 MG PO TABS: 325.0000 mg | ORAL_TABLET | Freq: Four times a day (QID) | ORAL | Status: DC | PRN

## 2014-07-23 MED ORDER — OXYCODONE-ACETAMINOPHEN 5-325 MG PO TABS
1.0000 | ORAL_TABLET | Freq: Once | ORAL | Status: AC
  Administered 2014-07-23: 1 via ORAL
  Filled 2014-07-23: qty 1

## 2014-07-23 NOTE — ED Notes (Signed)
EDPA marissa  at bedside.

## 2014-07-23 NOTE — ED Notes (Signed)
Pt discharged home with all belongings, pt alert, oriented  And ambulatory upon discharge. 1 new Rx prescribed, pt verbalizes understanding of discharge instructions. Pt driven home bt friend.
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
## 2014-07-23 NOTE — Discharge Instructions (Signed)
Please call your doctor for a followup appointment within 24-48 hours. When you talk to your doctor please let them know that you were seen in the emergency department and have them acquire all of your records so that they can discuss the findings with you and formulate a treatment plan to fully care for your new and ongoing problems. Please call and set-up an appointment with your primary care provider and orthopedics Please rest and stay hydrated Please avoid heavy lifting or strenuous activity  Please apply pillow to aid in relief when coughing or sneezing Please take medications as prescribed - while on pain medications there is to be no drinking alcohol, driving, operating any heavy machinery. If extra please dispose in a proper manner. Please do not take any extra Tylenol with this medication for this can lead to Tylenol overdose and liver issues.  Please continue to monitor symptoms closely and if symptoms are to worsen or change (fever greater than 101, chills, sweating, nausea, vomiting, chest pain, shortness of breathe, difficulty breathing, weakness, numbness, tingling, worsening or changes to pain pattern, abdominal pain, fall, injury, weakness) please report back to the Emergency Department immediately.   Chest Contusion A chest contusion is a deep bruise on your chest area. Contusions are the result of an injury that caused bleeding under the skin. A chest contusion may involve bruising of the skin, muscles, or ribs. The contusion may turn blue, purple, or yellow. Minor injuries will give you a painless contusion, but more severe contusions may stay painful and swollen for a few weeks. CAUSES  A contusion is usually caused by a blow, trauma, or direct force to an area of the body. SYMPTOMS   Swelling and redness of the injured area.  Discoloration of the injured area.  Tenderness and soreness of the injured area.  Pain. DIAGNOSIS  The diagnosis can be made by taking a history and  performing a physical exam. An X-ray, CT scan, or MRI may be needed to determine if there were any associated injuries, such as broken bones (fractures) or internal injuries. TREATMENT  Often, the best treatment for a chest contusion is resting, icing, and applying cold compresses to the injured area. Deep breathing exercises may be recommended to reduce the risk of pneumonia. Over-the-counter medicines may also be recommended for pain control. HOME CARE INSTRUCTIONS   Put ice on the injured area.  Put ice in a plastic bag.  Place a towel between your skin and the bag.  Leave the ice on for 15-20 minutes, 03-04 times a day.  Only take over-the-counter or prescription medicines as directed by your caregiver. Your caregiver may recommend avoiding anti-inflammatory medicines (aspirin, ibuprofen, and naproxen) for 48 hours because these medicines may increase bruising.  Rest the injured area.  Perform deep-breathing exercises as directed by your caregiver.  Stop smoking if you smoke.  Do not lift objects over 5 pounds (2.3 kg) for 3 days or longer if recommended by your caregiver. SEEK IMMEDIATE MEDICAL CARE IF:   You have increased bruising or swelling.  You have pain that is getting worse.  You have difficulty breathing.  You have dizziness, weakness, or fainting.  You have blood in your urine or stool.  You cough up or vomit blood.  Your swelling or pain is not relieved with medicines. MAKE SURE YOU:   Understand these instructions.  Will watch your condition.  Will get help right away if you are not doing well or get worse. Document Released: 05/18/2001  Document Revised: 05/17/2012 Document Reviewed: 02/14/2012 [facility] Patient Information 2015 [place]. This information is not intended to replace advice given to you by your health care provider. Make sure you discuss any questions you have with your health care provider.   Emergency Department Resource  Guide 1) Find a Doctor and Pay Out of Pocket Although you won't have to find out who is covered by your insurance plan, it is a good idea to ask around and get recommendations. You will then need to call the office and see if the doctor you have chosen will accept you as a new patient and what types of options they offer for patients who are self-pay. Some doctors offer discounts or will set up payment plans for their patients who do not have insurance, but you will need to ask so you aren't surprised when you get to your appointment.  2) Contact Your Local Health Department Not all health departments have doctors that can see patients for sick visits, but many do, so it is worth a call to see if yours does. If you don't know where your local health department is, you can check in your phone book. The CDC also has a tool to help you locate your state's health department, and many state websites also have listings of all of their local health departments.  3) Find a Walk-in Clinic If your illness is not likely to be very severe or complicated, you may want to try a walk in clinic. These are popping up all over the country in pharmacies, drugstores, and shopping centers. They're usually staffed by nurse practitioners or physician assistants that have been trained to treat common illnesses and complaints. They're usually fairly quick and inexpensive. However, if you have serious medical issues or chronic medical problems, these are probably not your best option.  No Primary Care Doctor: - Call Health Connect at  [phone] - they can help you locate a primary care doctor that  accepts your insurance, provides certain services, etc. - Physician Referral Service- [phone]  Chronic Pain Problems: Organization         Address  Phone   Notes  [facility]  [phone] Patients need to be referred by their primary care doctor.   Medication Assistance: Organization          Address  Phone   Notes  [facility] [address] [place], [place] [id number] [phone] --Must be a resident of [place] -- Must have NO insurance coverage whatsoever (no Medicaid/ Medicare, etc.) -- The pt. MUST have a primary care doctor that directs their care regularly and follows them in the community   [facility]ist  [phone]   [employer]  [phone]    Agencies that provide inexpensive medical care: Organization         Address  Phone   Notes  [facility]  [phone]   [facility]    [phone]   [facility] [address] [place], [place] [address] [phone]) [phone]   [facility] [place] [address], [place] [phone]   [facility]    [phone]   [facility]    [phone](336) [phone]   [facility]  [address],  Phone:  [phone], Fax:  [phone] Hours of Operation:  9 am - 6 pm, M-F.  Also accepts  Medicaid/Medicare and self-pay.  [facility]  [address], [place] Phone: [phone], Fax: [phone]. Hours of Operation:  8:30 am - 5:30 pm, M-F.  Also accepts Medicaid and self-pay.  [facility] [place] [address] [place] Phone: [phone]   [facility] [address] Natasha Bence [place], [place] [phone], Ext. [phone] Mondays & Thursdays: 7-9 AM.  First 15 patients are seen on a first come, first serve basis.    Medicaid-accepting [facility] Providers:  Organization         Address  Phone   Notes  [facility] [address], [place] [phone] Also accepts self-pay patients.  [facility] [address] Laurell Josephs [place], [place]  [phone]   [facility] [address], [place] [phone]   [facility] [address], [place] [phone]   Renaye Rakers [address], [place]   [phone] Only accepts [place] Access [place] patients after they have their name applied to their card.   Self-Pay (no insurance) in [facility]:  Organization         Address  Phone   Notes  Sickle Cell Patients, [facility] [address] [place], [place] [phone]   [facility] [address] [place], [place] [phone]   [facility] [place]  [address], [place] [phone]   [facility]/Dr. Osei-Bonsu  [address], [place] or [address], [place] [phone] Phone number for both [place] and [place] locations is the same.  [facility] [address] [place] [phone]   [facility] [address], [place] or [address] [phone]   [facility] [address] [place] [phone], phone; [phone], fax Sees patients 1st and 3rd Saturday of every month.  Must not qualify for public or private insurance (i.e. Medicaid, Medicare, [place] Health Choice, Veterans' Benefits)  Household income should be no more than 200% of the poverty level The clinic cannot treat you if you are pregnant or think you are pregnant  Sexually transmitted diseases are not treated at the clinic.    Dental Care: Organization         Address  Phone  Notes  [facility] of [facility] [address] [place], [place] [phone] Accepts children up to age 24 who are enrolled in [place] or [place] Health Choice; pregnant women with a Medicaid card; and children who have applied for Medicaid or [place] Health Choice, but were declined, whose parents can pay a reduced fee at time of service.  [facility] Department of [facility]  [address], [place] [phone] Accepts children up to age 49 who are enrolled in [place] or [place] Health Choice; pregnant women with a Medicaid card; and children who have applied for Medicaid or  Health Choice, but were declined, whose parents can pay a reduced fee at time of service.  [facility]  [address] [place], [place] [phone] Patients are seen by appointment only. Walk-ins are not accepted. [facility] will see patients 54 years of age and older. Monday - Tuesday (8am-5pm) Most  Wednesdays (8:30-5pm) $30 per visit, cash only  [facility] Access PRO[facility]  [address], [facility] [phone] Patients are seen by appointment only. Walk-ins are not accepted. [facility] will see patients 62 years of age and older. One Wednesday Evening (Monthly: Volunteer Based).  $30 per visit, cash only  [employer]  [phone] for adults; Children under age 2, call Graduate Pediatric Dentistry at [phone]. Children aged 1-14, please call [phone] to request a pediatric application.  Dental services are provided in all areas of dental care including fillings, crowns and bridges, complete and partial dentures, implants, gum treatment, root canals, and extractions. Preventive care is also provided. Treatment is provided to both adults and children. Patients are selected via a lottery and there is often a waiting list.   [facility] [address], [place]  [phone] [email or web address]   [facility] [address] [place], [place] [phone], Ext. [phone] Second and Fourth Thursday of each month, opens at 6:30 AM; Clinic ends at 9 AM.  Patients are seen on a first-come first-served basis, and a limited number are seen during each clinic.   [facility]  [address] Ether Griffins [place], [place] [phone]   Eligibility Requirements You must  have lived in [place], [place], or [place] counties for at least the last three months.   You cannot be eligible for state or federal sponsored [place], including [employer], [place], or [employer].   You generally cannot be eligible for healthcare insurance through your employer.    How to apply: Eligibility screenings are held every Tuesday and Wednesday afternoon from 1:00 pm until 4:00 pm. You do not need an appointment for the interview!  [facility] [address] [place], [place] [phone]   [facility] Department  [phone]   [facility] Department  [phone]   [facility] Health Department  [phone]    Behavioral Health Resources in the Community: Intensive Outpatient Programs Organization         Address  Phone  Notes  [facility] [address], [place], [place] [phone]   [facility] [address], [place], [place] [phone]   [facility] [address] [place], [place]  [phone]   [facility] [address]  [place], [place] [phone] or [phone]   Substance Abuse Resources Organization         Address  Phone  Notes  Al[facility] Drug Services  [phone]   [facility]  [phone]   [facility]  [phone]   Floydene Flock  [phone]   Residential & Outpatient Substance Abuse Program  [phone]   Psychological Services Organization         Address  Phone  Notes  [facility] Health  [phone]   [facility] Services  [phone]   [facility] Mental Health [address], [place] [phone] or [phone]    Mobile Crisis Teams Organization         Address  Phone  Notes  [facility], Mobile Crisis Care Unit  [phone]   [facility]  [address] [place], [place] [phone]   Doristine Locks [address] [place] [phone]    Self-Help/Support Groups Organization         Address  Phone  Notes  [facility] [place] - variety of support groups  336- I7437963 Call for more information  Narcotics Anonymous (NA), [facility] [address], [employer] [place]  2 meetings at this location   Statistician         Address  Phone  Notes  [facility] [address],    [place]  [phone]   [facility]  [address], [place] [address], [place], [place] [phone]   [facility] [address] [place], [place] [phone] Admissions: 8am-3pm M-F  [facility] [address],    [place], [place] [phone]   [facility] [address] [place], [place], [place] [phone]   [facility] [address],  [place], [place] [phone]   [facility] - Intensive Outpatient [address]urell Joseph[address], [place], [place] [phone]   [facility] ([facility] [address]  [place], [place] [phone] or [phone]   [facility] ([facility]) [address] [place], [place] [phone] Accepts Medicaid  [facility] [place] [address]  [place] [phone] Substance Abuse/Addiction Treatment   [facility] Organization         Address  Phone  Notes  [facility]  [phone]   Angie Fava, PhD [address] Ervin Knack [place], [place]   [phone] or [phone]   [facility]   [address] [place], [place] [phone]   [facility] [address], [place], [place] [phone] Insurance/Medicaid/sponsorship through [facility] [address]                                    [place], [place] [phone] Therapy/tele-psych/case  [facility] [address], [place] [phone]    Dr. Lolly Mustache  [phone]   [facility]  [employer] [facility] Dept. 1) [address] [place] 2) [address] [place] 3)  [address], [place] [phone]  [phone]   [facility] Child Abuse Hotline (985) 640-0837 or [phone])[phone] (After Hours)

## 2014-07-23 NOTE — ED Notes (Signed)
Presents with fall down 4 steps, denies LOC. C/o left sided back pain/rib pain worse when taking deep breath. SATs WNL, breath sounds clear.

## 2014-09-30 ENCOUNTER — Emergency Department (HOSPITAL_COMMUNITY)
Admission: EM | Admit: 2014-09-30 | Discharge: 2014-09-30 | Disposition: A | Discharge status: Home / Self Care | Payer: No Typology Code available for payment source | Attending: Emergency Medicine | Admitting: Emergency Medicine

## 2014-09-30 ENCOUNTER — Encounter (HOSPITAL_COMMUNITY): Payer: Self-pay | Admitting: *Deleted

## 2014-09-30 DIAGNOSIS — M25511 Pain in right shoulder: Secondary | ICD-10-CM | POA: Insufficient documentation

## 2014-09-30 MED ORDER — IBUPROFEN 800 MG PO TABS: 800.0000 mg | ORAL_TABLET | Freq: Three times a day (TID) | ORAL | Status: DC | PRN

## 2014-09-30 MED ORDER — KETOROLAC TROMETHAMINE 60 MG/2ML IM SOLN
60.0000 mg | Freq: Once | INTRAMUSCULAR | Status: AC
  Administered 2014-09-30: 60 mg via INTRAMUSCULAR
  Filled 2014-09-30: qty 2

## 2014-09-30 MED ORDER — PROCHLORPERAZINE EDISYLATE 5 MG/ML IJ SOLN
10.0000 mg | Freq: Once | INTRAMUSCULAR | Status: AC
  Administered 2014-09-30: 10 mg via INTRAMUSCULAR
  Filled 2014-09-30: qty 2

## 2014-09-30 MED ORDER — HYDROCODONE-ACETAMINOPHEN 5-325 MG PO TABS: 1.0000 | ORAL_TABLET | Freq: Four times a day (QID) | ORAL | Status: DC | PRN

## 2014-09-30 NOTE — Discharge Instructions (Signed)
Return here as needed. Follow up with your doctor. °

## 2014-09-30 NOTE — Care Management (Signed)
NCM consulted to help pt find out about orange card status.  NCM called [facility] referral coordinator Arna Medici(Nora) to find out if she was approved.  Arna Mediciora states that application does not look like it was completed.  Ad[facility] Tuesday 2pm-5pm or anytime on Wednesday.
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
## 2014-09-30 NOTE — ED Notes (Signed)
Declined W/C at D/C and was escorted to lobby by RN. 
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
## 2014-12-27 ENCOUNTER — Ambulatory Visit: Payer: Self-pay

## 2015-01-17 ENCOUNTER — Encounter (HOSPITAL_COMMUNITY): Payer: Self-pay | Admitting: Emergency Medicine

## 2015-01-17 ENCOUNTER — Emergency Department (HOSPITAL_COMMUNITY)
Admission: EM | Admit: 2015-01-17 | Discharge: 2015-01-17 | Disposition: A | Discharge status: Home / Self Care | Payer: Self-pay | Attending: Emergency Medicine | Admitting: Emergency Medicine

## 2015-01-17 ENCOUNTER — Emergency Department (HOSPITAL_COMMUNITY)
Admission: EM | Admit: 2015-01-17 | Discharge: 2015-01-18 | Disposition: A | Discharge status: Home / Self Care | Payer: Self-pay | Attending: Emergency Medicine | Admitting: Emergency Medicine

## 2015-01-17 DIAGNOSIS — G43809 Other migraine, not intractable, without status migrainosus: Secondary | ICD-10-CM | POA: Insufficient documentation

## 2015-01-17 DIAGNOSIS — H9319 Tinnitus, unspecified ear: Secondary | ICD-10-CM | POA: Insufficient documentation

## 2015-01-17 MED ORDER — ONDANSETRON 4 MG PO TBDP
4.0000 mg | ORAL_TABLET | Freq: Once | ORAL | Status: AC
  Administered 2015-01-17: 4 mg via ORAL
  Filled 2015-01-17: qty 1

## 2015-01-17 MED ORDER — KETOROLAC TROMETHAMINE 60 MG/2ML IM SOLN
60.0000 mg | Freq: Once | INTRAMUSCULAR | Status: AC
  Administered 2015-01-17: 60 mg via INTRAMUSCULAR
  Filled 2015-01-17: qty 2

## 2015-01-17 MED ORDER — SODIUM CHLORIDE 0.9 % IV BOLUS (SEPSIS)
1000.0000 mL | Freq: Once | INTRAVENOUS | Status: AC
  Administered 2015-01-17: 1000 mL via INTRAVENOUS

## 2015-01-17 MED ORDER — DIPHENHYDRAMINE HCL 50 MG/ML IJ SOLN
25.0000 mg | Freq: Once | INTRAMUSCULAR | Status: AC
  Administered 2015-01-17: 25 mg via INTRAVENOUS
  Filled 2015-01-17: qty 1

## 2015-01-17 MED ORDER — OXYCODONE-ACETAMINOPHEN 5-325 MG PO TABS
1.0000 | ORAL_TABLET | Freq: Once | ORAL | Status: AC
  Administered 2015-01-18: 1 via ORAL
  Filled 2015-01-17: qty 1

## 2015-01-17 MED ORDER — METOCLOPRAMIDE HCL 10 MG PO TABS: 10.0000 mg | ORAL_TABLET | Freq: Four times a day (QID) | ORAL | Status: DC | PRN

## 2015-01-17 MED ORDER — ONDANSETRON 4 MG PO TBDP
4.0000 mg | ORAL_TABLET | Freq: Once | ORAL | Status: AC
  Administered 2015-01-18: 4 mg via ORAL
  Filled 2015-01-17: qty 1

## 2015-01-17 MED ORDER — NAPROXEN 500 MG PO TABS: 500.0000 mg | ORAL_TABLET | Freq: Two times a day (BID) | ORAL | Status: DC

## 2015-01-17 MED ORDER — FENTANYL CITRATE (PF) 100 MCG/2ML IJ SOLN
50.0000 ug | Freq: Once | INTRAMUSCULAR | Status: AC
  Administered 2015-01-17: 50 ug via INTRAVENOUS
  Filled 2015-01-17: qty 2

## 2015-01-17 MED ORDER — METOCLOPRAMIDE HCL 5 MG/ML IJ SOLN
10.0000 mg | Freq: Once | INTRAMUSCULAR | Status: AC
  Administered 2015-01-17: 10 mg via INTRAVENOUS
  Filled 2015-01-17: qty 2
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
## 2015-01-17 NOTE — ED Notes (Signed)
Pt to ED with c/o migrain headache.  St's she was seen here for same last night and the medication she received made her sick and dizzy.  St's has not been able to eat all day

## 2015-01-17 NOTE — ED Notes (Signed)
MD at bedside. 

## 2015-01-17 NOTE — ED Notes (Signed)
Pt reported dizziness when walking out EDP notified. Pt given coke and graham crackers. EDP reports pt is okay for d/c.
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
## 2015-01-17 NOTE — Discharge Instructions (Signed)
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
## 2015-01-18 NOTE — Discharge Instructions (Signed)
Headaches, Frequently Asked Questions °MIGRAINE HEADACHES °Q: What is migraine? What causes it? How can I treat it? °A: Generally, migraine headaches begin as a dull ache. Then they develop into a constant, throbbing, and pulsating pain. You may experience pain at the temples. You may experience pain at the front or back of one or both sides of the head. The pain is usually accompanied by a combination of: °· Nausea. °· Vomiting. °· Sensitivity to light and noise. °Some people (about 15%) experience an aura (see below) before an attack. The cause of migraine is believed to be chemical reactions in the brain. Treatment for migraine may include over-the-counter or prescription medications. It may also include self-help techniques. These include relaxation training and biofeedback.  °Q: What is an aura? °A: About 15% of people with migraine get an "aura". This is a sign of neurological symptoms that occur before a migraine headache. You may see wavy or jagged lines, dots, or flashing lights. You might experience tunnel vision or blind spots in one or both eyes. The aura can include visual or auditory hallucinations (something imagined). It may include disruptions in smell (such as strange odors), taste or touch. Other symptoms include: °· Numbness. °· A "pins and needles" sensation. °· Difficulty in recalling or speaking the correct word. °These neurological events may last as long as 60 minutes. These symptoms will fade as the headache begins. °Q: What is a trigger? °A: Certain physical or environmental factors can lead to or "trigger" a migraine. These include: °· Foods. °· Hormonal changes. °· Weather. °· Stress. °It is important to remember that triggers are different for everyone. To help prevent migraine attacks, you need to figure out which triggers affect you. Keep a headache diary. This is a good way to track triggers. The diary will help you talk to your healthcare professional about your condition. °Q: Does  weather affect migraines? °A: Bright sunshine, hot, humid conditions, and drastic changes in barometric pressure may lead to, or "trigger," a migraine attack in some people. But studies have shown that weather does not act as a trigger for everyone with migraines. °Q: What is the link between migraine and hormones? °A: Hormones start and regulate many of your body's functions. Hormones keep your body in balance within a constantly changing environment. The levels of hormones in your body are unbalanced at times. Examples are during menstruation, pregnancy, or menopause. That can lead to a migraine attack. In fact, about three quarters of all women with migraine report that their attacks are related to the menstrual cycle.  °Q: Is there an increased risk of stroke for migraine sufferers? °A: The likelihood of a migraine attack causing a stroke is very remote. That is not to say that migraine sufferers cannot have a stroke associated with their migraines. In persons under age 40, the most common associated factor for stroke is migraine headache. But over the course of a person's normal life span, the occurrence of migraine headache may actually be associated with a reduced risk of dying from cerebrovascular disease due to stroke.  °Q: What are acute medications for migraine? °A: Acute medications are used to treat the pain of the headache after it has started. Examples over-the-counter medications, NSAIDs, ergots, and triptans.  °Q: What are the triptans? °A: Triptans are the newest class of abortive medications. They are specifically targeted to treat migraine. Triptans are vasoconstrictors. They moderate some chemical reactions in the brain. The triptans work on receptors in your brain. Triptans help   to restore the balance of a neurotransmitter called serotonin. Fluctuations in levels of serotonin are thought to be a main cause of migraine.  °Q: Are over-the-counter medications for migraine effective? °A:  Over-the-counter, or "OTC," medications may be effective in relieving mild to moderate pain and associated symptoms of migraine. But you should see your caregiver before beginning any treatment regimen for migraine.  °Q: What are preventive medications for migraine? °A: Preventive medications for migraine are sometimes referred to as "prophylactic" treatments. They are used to reduce the frequency, severity, and length of migraine attacks. Examples of preventive medications include antiepileptic medications, antidepressants, beta-blockers, calcium channel blockers, and NSAIDs (nonsteroidal anti-inflammatory drugs). °Q: Why are anticonvulsants used to treat migraine? °A: During the past few years, there has been an increased interest in antiepileptic drugs for the prevention of migraine. They are sometimes referred to as "anticonvulsants". Both epilepsy and migraine may be caused by similar reactions in the brain.  °Q: Why are antidepressants used to treat migraine? °A: Antidepressants are typically used to treat people with depression. They may reduce migraine frequency by regulating chemical levels, such as serotonin, in the brain.  °Q: What alternative therapies are used to treat migraine? °A: The term "alternative therapies" is often used to describe treatments considered outside the scope of conventional Western medicine. Examples of alternative therapy include acupuncture, acupressure, and yoga. Another common alternative treatment is herbal therapy. Some herbs are believed to relieve headache pain. Always discuss alternative therapies with your caregiver before proceeding. Some herbal products contain arsenic and other toxins. °TENSION HEADACHES °Q: What is a tension-type headache? What causes it? How can I treat it? °A: Tension-type headaches occur randomly. They are often the result of temporary stress, anxiety, fatigue, or anger. Symptoms include soreness in your temples, a tightening band-like sensation  around your head (a "vice-like" ache). Symptoms can also include a pulling feeling, pressure sensations, and contracting head and neck muscles. The headache begins in your forehead, temples, or the back of your head and neck. Treatment for tension-type headache may include over-the-counter or prescription medications. Treatment may also include self-help techniques such as relaxation training and biofeedback. °CLUSTER HEADACHES °Q: What is a cluster headache? What causes it? How can I treat it? °A: Cluster headache gets its name because the attacks come in groups. The pain arrives with little, if any, warning. It is usually on one side of the head. A tearing or bloodshot eye and a runny nose on the same side of the headache may also accompany the pain. Cluster headaches are believed to be caused by chemical reactions in the brain. They have been described as the most severe and intense of any headache type. Treatment for cluster headache includes prescription medication and oxygen. °SINUS HEADACHES °Q: What is a sinus headache? What causes it? How can I treat it? °A: When a cavity in the bones of the face and skull (a sinus) becomes inflamed, the inflammation will cause localized pain. This condition is usually the result of an allergic reaction, a tumor, or an infection. If your headache is caused by a sinus blockage, such as an infection, you will probably have a fever. An x-ray will confirm a sinus blockage. Your caregiver's treatment might include antibiotics for the infection, as well as antihistamines or decongestants.  °REBOUND HEADACHES °Q: What is a rebound headache? What causes it? How can I treat it? °A: A pattern of taking acute headache medications too often can lead to a condition known as "rebound headache."   A pattern of taking too much headache medication includes taking it more than 2 days per week or in excessive amounts. That means more than the label or a caregiver advises. With rebound  headaches, your medications not only stop relieving pain, they actually begin to cause headaches. Doctors treat rebound headache by tapering the medication that is being overused. Sometimes your caregiver will gradually substitute a different type of treatment or medication. Stopping may be a challenge. Regularly overusing a medication increases the potential for serious side effects. Consult a caregiver if you regularly use headache medications more than 2 days per week or more than the label advises. °ADDITIONAL QUESTIONS AND ANSWERS °Q: What is biofeedback? °A: Biofeedback is a self-help treatment. Biofeedback uses special equipment to monitor your body's involuntary physical responses. Biofeedback monitors: °· Breathing. °· Pulse. °· Heart rate. °· Temperature. °· Muscle tension. °· Brain activity. °Biofeedback helps you refine and perfect your relaxation exercises. You learn to control the physical responses that are related to stress. Once the technique has been mastered, you do not need the equipment any more. °Q: Are headaches hereditary? °A: Four out of five (80%) of people that suffer report a family history of migraine. Scientists are not sure if this is genetic or a family predisposition. Despite the uncertainty, a child has a 50% chance of having migraine if one parent suffers. The child has a 75% chance if both parents suffer.  °Q: Can children get headaches? °A: By the time they reach high school, most young people have experienced some type of headache. Many safe and effective approaches or medications can prevent a headache from occurring or stop it after it has begun.  °Q: What type of doctor should I see to diagnose and treat my headache? °A: Start with your primary caregiver. Discuss his or her experience and approach to headaches. Discuss methods of classification, diagnosis, and treatment. Your caregiver may decide to recommend you to a headache specialist, depending upon your symptoms or other  physical conditions. Having diabetes, allergies, etc., may require a more comprehensive and inclusive approach to your headache. The [employer] will provide, upon request, a list of NHF physician members in your state. °Document Released: 11/13/2003 Document Revised: 11/15/2011 Document Reviewed: 04/22/2008 °ExitCare® Patient Information ©2015 [employer]. This information is not intended to replace advice given to you by your health care provider. Make sure you discuss any questions you have with your health care provider. ° °Migraine Headache °A migraine headache is an intense, throbbing pain on one or both sides of your head. A migraine can last for 30 minutes to several hours. °CAUSES  °The exact cause of a migraine headache is not always known. However, a migraine may be caused when nerves in the brain become irritated and release chemicals that cause inflammation. This causes pain. °Certain things may also trigger migraines, such as: °· Alcohol. °· Smoking. °· Stress. °· Menstruation. °· Aged cheeses. °· Foods or drinks that contain nitrates, glutamate, aspartame, or tyramine. °· Lack of sleep. °· Chocolate. °· Caffeine. °· Hunger. °· Physical exertion. °· Fatigue. °· Medicines used to treat chest pain (nitroglycerine), birth control pills, estrogen, and some blood pressure medicines. °SIGNS AND SYMPTOMS °· Pain on one or both sides of your head. °· Pulsating or throbbing pain. °· Severe pain that prevents daily activities. °· Pain that is aggravated by any physical activity. °· Nausea, vomiting, or both. °· Dizziness. °· Pain with exposure to bright lights, loud noises, or activity. °·   General sensitivity to bright lights, loud noises, or smells. °Before you get a migraine, you may get warning signs that a migraine is coming (aura). An aura may include: °· Seeing flashing lights. °· Seeing bright spots, halos, or zigzag lines. °· Having tunnel vision or blurred vision. °· Having feelings  of numbness or tingling. °· Having trouble talking. °· Having muscle weakness. °DIAGNOSIS  °A migraine headache is often diagnosed based on: °· Symptoms. °· Physical exam. °· A CT scan or MRI of your head. These imaging tests cannot diagnose migraines, but they can help rule out other causes of headaches. °TREATMENT °Medicines may be given for pain and nausea. Medicines can also be given to help prevent recurrent migraines.  °HOME CARE INSTRUCTIONS °· Only take over-the-counter or prescription medicines for pain or discomfort as directed by your health care provider. The use of long-term narcotics is not recommended. °· Lie down in a dark, quiet room when you have a migraine. °· Keep a journal to find out what may trigger your migraine headaches. For example, write down: °¨ What you eat and drink. °¨ How much sleep you get. °¨ Any change to your diet or medicines. °· Limit alcohol consumption. °· Quit smoking if you smoke. °· Get 7-9 hours of sleep, or as recommended by your health care provider. °· Limit stress. °· Keep lights dim if bright lights bother you and make your migraines worse. °SEEK IMMEDIATE MEDICAL CARE IF:  °· Your migraine becomes severe. °· You have a fever. °· You have a stiff neck. °· You have vision loss. °· You have muscular weakness or loss of muscle control. °· You start losing your balance or have trouble walking. °· You feel faint or pass out. °· You have severe symptoms that are different from your first symptoms. °MAKE SURE YOU:  °· Understand these instructions. °· Will watch your condition. °· Will get help right away if you are not doing well or get worse. °Document Released: 08/23/2005 Document Revised: 01/07/2014 Document Reviewed: 04/30/2013 °ExitCare® Patient Information ©2015 [employer]. This information is not intended to replace advice given to you by your health care provider. Make sure you discuss any questions you have with your health care provider. ° °

## 2015-01-18 NOTE — ED Notes (Signed)
PA at bedside.

## 2015-12-29 ENCOUNTER — Ambulatory Visit (INDEPENDENT_AMBULATORY_CARE_PROVIDER_SITE_OTHER): Payer: No Typology Code available for payment source

## 2015-12-29 ENCOUNTER — Encounter (HOSPITAL_COMMUNITY): Payer: Self-pay | Admitting: *Deleted

## 2015-12-29 ENCOUNTER — Ambulatory Visit (HOSPITAL_COMMUNITY)
Admission: EM | Admit: 2015-12-29 | Discharge: 2015-12-29 | Disposition: A | Discharge status: Home / Self Care | Payer: No Typology Code available for payment source | Attending: Family Medicine | Admitting: Family Medicine

## 2015-12-29 MED ORDER — TRAMADOL HCL 50 MG PO TABS: 50.0000 mg | ORAL_TABLET | Freq: Four times a day (QID) | ORAL | Status: DC | PRN
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
## 2016-01-08 ENCOUNTER — Encounter (HOSPITAL_COMMUNITY): Payer: Self-pay | Admitting: Emergency Medicine

## 2016-01-08 ENCOUNTER — Emergency Department (HOSPITAL_COMMUNITY): Payer: No Typology Code available for payment source

## 2016-01-08 ENCOUNTER — Emergency Department (HOSPITAL_COMMUNITY)
Admission: EM | Admit: 2016-01-08 | Discharge: 2016-01-08 | Disposition: A | Discharge status: Home / Self Care | Payer: No Typology Code available for payment source | Attending: Emergency Medicine | Admitting: Emergency Medicine

## 2016-01-08 DIAGNOSIS — M25562 Pain in left knee: Secondary | ICD-10-CM

## 2016-01-08 DIAGNOSIS — Y998 Other external cause status: Secondary | ICD-10-CM | POA: Insufficient documentation

## 2016-01-08 DIAGNOSIS — Y92410 Unspecified street and highway as the place of occurrence of the external cause: Secondary | ICD-10-CM | POA: Diagnosis not present

## 2016-01-08 DIAGNOSIS — S8992XA Unspecified injury of left lower leg, initial encounter: Secondary | ICD-10-CM | POA: Insufficient documentation

## 2016-01-08 DIAGNOSIS — M62830 Muscle spasm of back: Secondary | ICD-10-CM | POA: Diagnosis not present

## 2016-01-08 DIAGNOSIS — S199XXA Unspecified injury of neck, initial encounter: Secondary | ICD-10-CM | POA: Insufficient documentation

## 2016-01-08 DIAGNOSIS — Y9389 Activity, other specified: Secondary | ICD-10-CM | POA: Insufficient documentation

## 2016-01-08 DIAGNOSIS — M542 Cervicalgia: Secondary | ICD-10-CM

## 2016-01-08 DIAGNOSIS — S0990XA Unspecified injury of head, initial encounter: Secondary | ICD-10-CM | POA: Insufficient documentation

## 2016-01-08 MED ORDER — METHOCARBAMOL 500 MG PO TABS: 500.0000 mg | ORAL_TABLET | Freq: Two times a day (BID) | ORAL | Status: DC

## 2016-01-08 MED ORDER — NAPROXEN 500 MG PO TABS: 500.0000 mg | ORAL_TABLET | Freq: Two times a day (BID) | ORAL | Status: DC

## 2016-01-08 MED ORDER — IBUPROFEN 800 MG PO TABS
800.0000 mg | ORAL_TABLET | Freq: Once | ORAL | Status: AC
  Administered 2016-01-08: 800 mg via ORAL
  Filled 2016-01-08: qty 1
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
## 2016-01-08 NOTE — ED Notes (Signed)
PT DISCHARGED. INSTRUCTIONS AND PRESCRIPTIONS GIVEN. AAOX3. PT IN NO APPARENT DISTRESS. THE OPPORTUNITY TO ASK QUESTIONS WAS PROVIDED. 
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
## 2016-01-13 ENCOUNTER — Ambulatory Visit: Payer: Self-pay | Attending: Family Medicine | Admitting: Family Medicine

## 2016-01-13 ENCOUNTER — Encounter: Payer: Self-pay | Admitting: Family Medicine

## 2016-01-13 VITALS — BP 175/95 | HR 92 | Temp 98.3°F | Resp 16 | Ht 66.0 in | Wt 109.0 lb

## 2016-01-13 DIAGNOSIS — Z79899 Other long term (current) drug therapy: Secondary | ICD-10-CM | POA: Insufficient documentation

## 2016-01-13 DIAGNOSIS — Z114 Encounter for screening for human immunodeficiency virus [HIV]: Secondary | ICD-10-CM

## 2016-01-13 DIAGNOSIS — M25511 Pain in right shoulder: Secondary | ICD-10-CM

## 2016-01-13 DIAGNOSIS — I10 Essential (primary) hypertension: Secondary | ICD-10-CM | POA: Insufficient documentation

## 2016-01-13 DIAGNOSIS — Z1159 Encounter for screening for other viral diseases: Secondary | ICD-10-CM

## 2016-01-13 MED ORDER — AMLODIPINE BESYLATE 5 MG PO TABS: 5.0000 mg | ORAL_TABLET | Freq: Every day | ORAL | Status: DC

## 2016-01-13 MED ORDER — ACETAMINOPHEN ER 650 MG PO TBCR: 650.0000 mg | EXTENDED_RELEASE_TABLET | Freq: Three times a day (TID) | ORAL | Status: DC | PRN

## 2016-01-13 MED ORDER — IBUPROFEN 600 MG PO TABS: 600.0000 mg | ORAL_TABLET | Freq: Three times a day (TID) | ORAL | Status: DC | PRN

## 2016-01-13 MED FILL — ?AMLODIPINE BESYLATE 5 MG T: 5 | 30 days supply | Qty: 30 | Fill #0

## 2016-01-13 MED FILL — IBUPROFEN 600 MG TABLET: 600 | 10 days supply | Qty: 30 | Fill #0
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
## 2016-01-13 NOTE — Assessment & Plan Note (Signed)
Chronic HTN Treat with norvasc 5 mg daily BMP with GFR Close f/u

## 2016-01-13 NOTE — Patient Instructions (Signed)
Regina [name] was seen today for hypertension.  Diagnoses and all orders for this visit:  Right anterior shoulder pain -     ibuprofen (ADVIL,MOTRIN) 600 MG tablet; Take 1 tablet (600 mg total) by mouth every 8 (eight) hours as needed. -     acetaminophen (TYLENOL 8 HOUR) 650 MG CR tablet; Take 1 tablet (650 mg total) by mouth every 8 (eight) hours as needed for pain.  Essential hypertension -     amLODipine (NORVASC) 5 MG tablet; Take 1 tablet (5 mg total) by mouth daily.   Start norvasc Return for blood work, BMP with GFR   F.u in 3-4 weeks for BP check and f/u upper back pain, neck pain and HA   Dr. Armen PickupFunches
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
## 2016-01-16 ENCOUNTER — Telehealth: Payer: Self-pay | Admitting: Family Medicine

## 2016-01-16 DIAGNOSIS — M25511 Pain in right shoulder: Secondary | ICD-10-CM

## 2016-01-16 NOTE — Telephone Encounter (Signed)
Patient called wanting to know the status of order for MRI. Please follow up.

## 2016-01-16 NOTE — Telephone Encounter (Signed)
CT preferred CT R shoulder ordered please schedule

## 2016-01-22 NOTE — Telephone Encounter (Signed)
Per scheduler CT order need to be change  With or with out contrast.

## 2016-01-26 NOTE — Telephone Encounter (Signed)
Correction: MRI R shoulder WITHOUT contrast preferred and ordered for chronic R shoulder pain  Please schedule

## 2016-02-04 NOTE — Telephone Encounter (Signed)
MRI schedule on 02/12/2016 at 5:00pm [facility] early [facility]  Pt notified

## 2016-02-12 ENCOUNTER — Ambulatory Visit (HOSPITAL_COMMUNITY): Payer: Self-pay

## 2016-03-15 MED FILL — ?AMLODIPINE BESYLATE 5 MG T: 5 | 30 days supply | Qty: 30 | Fill #1

## 2016-04-20 ENCOUNTER — Other Ambulatory Visit: Payer: Self-pay | Admitting: Family Medicine

## 2016-04-20 DIAGNOSIS — M25511 Pain in right shoulder: Secondary | ICD-10-CM

## 2016-04-20 MED FILL — IBUPROFEN 600 MG TABLET: 600 | 10 days supply | Qty: 30 | Fill #0

## 2016-05-03 MED FILL — ?AMLODIPINE BESYLATE 5 MG T: 5 | 30 days supply | Qty: 30 | Fill #2

## 2017-03-14 ENCOUNTER — Other Ambulatory Visit: Payer: Self-pay | Admitting: Family Medicine

## 2017-03-14 DIAGNOSIS — I10 Essential (primary) hypertension: Secondary | ICD-10-CM

## 2017-03-16 ENCOUNTER — Other Ambulatory Visit: Payer: Self-pay | Admitting: Family Medicine

## 2017-03-16 DIAGNOSIS — I10 Essential (primary) hypertension: Secondary | ICD-10-CM

## 2017-04-08 ENCOUNTER — Ambulatory Visit: Payer: Self-pay | Admitting: Internal Medicine

## 2017-10-04 ENCOUNTER — Emergency Department (HOSPITAL_COMMUNITY)
Admission: EM | Admit: 2017-10-04 | Discharge: 2017-10-04 | Disposition: A | Discharge status: Home / Self Care | Payer: Self-pay | Attending: Emergency Medicine | Admitting: Emergency Medicine

## 2017-10-04 ENCOUNTER — Encounter (HOSPITAL_COMMUNITY): Payer: Self-pay | Admitting: Emergency Medicine

## 2017-10-04 ENCOUNTER — Other Ambulatory Visit: Payer: Self-pay

## 2017-10-04 DIAGNOSIS — R030 Elevated blood-pressure reading, without diagnosis of hypertension: Secondary | ICD-10-CM | POA: Insufficient documentation

## 2017-10-04 DIAGNOSIS — R51 Headache: Secondary | ICD-10-CM | POA: Insufficient documentation

## 2017-10-04 DIAGNOSIS — Z5321 Procedure and treatment not carried out due to patient leaving prior to being seen by health care provider: Secondary | ICD-10-CM | POA: Insufficient documentation

## 2017-10-04 HISTORY — DX: Essential (primary) hypertension: I10

## 2017-10-04 LAB — URINALYSIS, ROUTINE W REFLEX MICROSCOPIC
BACTERIA UA: NONE SEEN
Bilirubin Urine: NEGATIVE
Glucose, UA: NEGATIVE mg/dL
Ketones, ur: NEGATIVE mg/dL
Nitrite: NEGATIVE
PROTEIN: NEGATIVE mg/dL
SPECIFIC GRAVITY, URINE: 1.002 — AB (ref 1.005–1.030)
Squamous Epithelial / LPF: NONE SEEN
pH: 6 (ref 5.0–8.0)

## 2017-10-04 LAB — CBC WITH DIFFERENTIAL/PLATELET
BASOS ABS: 0.1 10*3/uL (ref 0.0–0.1)
BASOS PCT: 1 %
Eosinophils Absolute: 0.1 10*3/uL (ref 0.0–0.7)
Eosinophils Relative: 1 %
HEMATOCRIT: 35.8 % — AB (ref 36.0–46.0)
Hemoglobin: 12.6 g/dL (ref 12.0–15.0)
LYMPHS PCT: 48 %
Lymphs Abs: 4.1 10*3/uL — ABNORMAL HIGH (ref 0.7–4.0)
MCH: 30 pg (ref 26.0–34.0)
MCHC: 35.2 g/dL (ref 30.0–36.0)
MCV: 85.2 fL (ref 78.0–100.0)
Monocytes Absolute: 0.9 10*3/uL (ref 0.1–1.0)
Monocytes Relative: 10 %
NEUTROS ABS: 3.4 10*3/uL (ref 1.7–7.7)
Neutrophils Relative %: 40 %
PLATELETS: 486 10*3/uL — AB (ref 150–400)
RBC: 4.2 MIL/uL (ref 3.87–5.11)
RDW: 13.5 % (ref 11.5–15.5)
WBC: 8.5 10*3/uL (ref 4.0–10.5)

## 2017-10-04 LAB — COMPREHENSIVE METABOLIC PANEL
ALBUMIN: 4.1 g/dL (ref 3.5–5.0)
ALT: 19 U/L (ref 14–54)
AST: 42 U/L — ABNORMAL HIGH (ref 15–41)
Alkaline Phosphatase: 112 U/L (ref 38–126)
Anion gap: 12 (ref 5–15)
BUN: 6 mg/dL (ref 6–20)
CHLORIDE: 97 mmol/L — AB (ref 101–111)
CO2: 21 mmol/L — ABNORMAL LOW (ref 22–32)
CREATININE: 0.66 mg/dL (ref 0.44–1.00)
Calcium: 9.4 mg/dL (ref 8.9–10.3)
GFR calc Af Amer: >60 mL/min (ref >60)
GLUCOSE: 84 mg/dL (ref 65–99)
POTASSIUM: 3.8 mmol/L (ref 3.5–5.1)
Sodium: 130 mmol/L — ABNORMAL LOW (ref 135–145)
Total Bilirubin: 0.5 mg/dL (ref 0.3–1.2)
Total Protein: 8.1 g/dL (ref 6.5–8.1)

## 2017-10-04 NOTE — ED Notes (Signed)
Pt called in lobby for vitals update. No response. 

## 2017-10-04 NOTE — ED Notes (Signed)
Second call in lobby. No response. 

## 2017-10-04 NOTE — ED Notes (Signed)
Third call in lobby. No response. 

## 2017-10-04 NOTE — ED Triage Notes (Signed)
Pt to ED c/o headache and HTN.  Pt st's she has not had her blood pressure meds in over 2 weeks because she doesn't have the money.  Pt also c/o nausea and vomiiting

## 2017-10-05 ENCOUNTER — Other Ambulatory Visit: Payer: Self-pay

## 2017-10-05 ENCOUNTER — Encounter (HOSPITAL_COMMUNITY): Payer: Self-pay | Admitting: Emergency Medicine

## 2017-10-05 ENCOUNTER — Emergency Department (HOSPITAL_COMMUNITY)
Admission: EM | Admit: 2017-10-05 | Discharge: 2017-10-05 | Disposition: A | Discharge status: Home / Self Care | Payer: Self-pay | Attending: Physician Assistant | Admitting: Physician Assistant

## 2017-10-05 DIAGNOSIS — R519 Headache, unspecified: Secondary | ICD-10-CM

## 2017-10-05 DIAGNOSIS — I10 Essential (primary) hypertension: Secondary | ICD-10-CM | POA: Insufficient documentation

## 2017-10-05 DIAGNOSIS — Z79899 Other long term (current) drug therapy: Secondary | ICD-10-CM | POA: Insufficient documentation

## 2017-10-05 DIAGNOSIS — R51 Headache: Secondary | ICD-10-CM | POA: Insufficient documentation

## 2017-10-05 LAB — CBC WITH DIFFERENTIAL/PLATELET
Basophils Absolute: 0.1 10*3/uL (ref 0.0–0.1)
Basophils Relative: 1 %
EOS PCT: 1 %
Eosinophils Absolute: 0.1 10*3/uL (ref 0.0–0.7)
HEMATOCRIT: 38.8 % (ref 36.0–46.0)
HEMOGLOBIN: 13.5 g/dL (ref 12.0–15.0)
LYMPHS ABS: 1.2 10*3/uL (ref 0.7–4.0)
LYMPHS PCT: 20 %
MCH: 29.9 pg (ref 26.0–34.0)
MCHC: 34.8 g/dL (ref 30.0–36.0)
MCV: 86 fL (ref 78.0–100.0)
Monocytes Absolute: 0.7 10*3/uL (ref 0.1–1.0)
Monocytes Relative: 12 %
NEUTROS ABS: 4 10*3/uL (ref 1.7–7.7)
NEUTROS PCT: 66 %
Platelets: 451 10*3/uL — ABNORMAL HIGH (ref 150–400)
RBC: 4.51 MIL/uL (ref 3.87–5.11)
RDW: 13.6 % (ref 11.5–15.5)
WBC: 5.9 10*3/uL (ref 4.0–10.5)

## 2017-10-05 LAB — BASIC METABOLIC PANEL
Anion gap: 11 (ref 5–15)
CHLORIDE: 98 mmol/L — AB (ref 101–111)
CO2: 21 mmol/L — ABNORMAL LOW (ref 22–32)
Calcium: 9.1 mg/dL (ref 8.9–10.3)
Creatinine, Ser: 0.61 mg/dL (ref 0.44–1.00)
GFR calc Af Amer: >60 mL/min (ref >60)
GFR calc non Af Amer: >60 mL/min (ref >60)
Glucose, Bld: 86 mg/dL (ref 65–99)
POTASSIUM: 4 mmol/L (ref 3.5–5.1)
SODIUM: 130 mmol/L — AB (ref 135–145)

## 2017-10-05 MED ORDER — METOCLOPRAMIDE HCL 5 MG/ML IJ SOLN
10.0000 mg | Freq: Once | INTRAMUSCULAR | Status: AC
  Administered 2017-10-05: 10 mg via INTRAVENOUS
  Filled 2017-10-05: qty 2

## 2017-10-05 MED ORDER — SODIUM CHLORIDE 0.9 % IV BOLUS (SEPSIS)
1000.0000 mL | Freq: Once | INTRAVENOUS | Status: AC
  Administered 2017-10-05: 1000 mL via INTRAVENOUS

## 2017-10-05 MED ORDER — LISINOPRIL 5 MG PO TABS: 5.0000 mg | ORAL_TABLET | Freq: Every day | ORAL | 0 refills | Status: DC

## 2017-10-05 MED ORDER — KETOROLAC TROMETHAMINE 30 MG/ML IJ SOLN
15.0000 mg | Freq: Once | INTRAMUSCULAR | Status: AC
  Administered 2017-10-05: 15 mg via INTRAVENOUS
  Filled 2017-10-05: qty 1

## 2017-10-05 MED ORDER — DEXAMETHASONE SODIUM PHOSPHATE 10 MG/ML IJ SOLN
10.0000 mg | Freq: Once | INTRAMUSCULAR | Status: AC
  Administered 2017-10-05: 10 mg via INTRAVENOUS
  Filled 2017-10-05: qty 1

## 2017-10-05 MED ORDER — DIPHENHYDRAMINE HCL 50 MG/ML IJ SOLN
12.5000 mg | Freq: Once | INTRAMUSCULAR | Status: AC
  Administered 2017-10-05: 12.5 mg via INTRAVENOUS
  Filled 2017-10-05: qty 1

## 2017-10-05 MED ORDER — LISINOPRIL 10 MG PO TABS
5.0000 mg | ORAL_TABLET | Freq: Once | ORAL | Status: AC
  Administered 2017-10-05: 5 mg via ORAL
  Filled 2017-10-05: qty 1

## 2017-10-05 MED FILL — LISINOPRIL 5 MG TABLET: 5 | 30 days supply | Qty: 30 | Fill #0

## 2017-10-05 NOTE — ED Triage Notes (Signed)
Pt reports continued headaches for three days. LWBS yesterday

## 2017-10-05 NOTE — Discharge Instructions (Signed)
Please take your blood pressure medicine once daily Please follow up with primary care on Feb 13th Return if worsening.

## 2017-10-05 NOTE — ED Provider Notes (Signed)
[facility]   CSN: [id number] Arrival date & time: 10/05/17  [id number]   History   Chief Complaint Chief Complaint  Patient presents with  . Headache    HPI Regina [name] is a 52 y.o. female who presents with a headache. PMH significant for HTN. She states that she has been off her blood pressure medicines for "awhile". She was previously on Amlodipine but states this did not control her blood pressure either. She reports a lot of stress in her life. 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 She has had a headache for the past three days. It has gradually worsened. It runs across her forehead and is over the right back side of her head. She has an aura, photophobia, and vomiting. She's tried Advil and Advil PM without relief. She hasn't been able to sleep due to the pain. Denies fever, LOC, trauma, neck stiffness, acute onset, worst headache of life, different from previous HA. She came to the ED yesterday as well but left due to long wait times.   HPI  Past Medical History:  Diagnosis Date  . Hypertension     Patient Active Problem List   Diagnosis Date Noted  . HTN (hypertension) 01/13/2016  . Right anterior shoulder pain 07/18/2014  . Postconcussion syndrome 05/28/2014    History reviewed. No pertinent surgical history.  OB History    No data available       Home Medications    Prior to Admission medications   Medication Sig Start Date End Date Taking? Authorizing Provider  acetaminophen (TYLENOL 8 HOUR) 650 MG CR tablet Take 1 tablet (650 mg total) by mouth every 8 (eight) hours as needed for pain. 01/13/16   Funches, Gerilyn NestleJosalyn, MD  amLODipine (NORVASC) 5 MG tablet Take 1 tablet (5 mg total) by mouth daily. 01/13/16   Funches, Gerilyn NestleJosalyn, MD  ibuprofen (ADVIL,MOTRIN) 600 MG tablet TAKE 1 TABLET BY MOUTH EVERY 8 HOURS AS NEEDED. 04/20/16   Dessa PhiFunches, Josalyn, MD    Family  History Family History  Problem Relation Age of Onset  . Leukemia Mother   . Hypertension Mother   . Hypertension Father   . Cancer Neg Hx   . Diabetes Neg Hx   . Heart disease Neg Hx     Social History Social History   Tobacco Use  . Smoking status: Never Smoker  . Smokeless tobacco: Never Used  Substance Use Topics  . Alcohol use: Yes    Comment: social  . Drug use: No     Allergies   Bee venom   Review of Systems Review of Systems  Constitutional: Negative for fever.  Eyes: Positive for photophobia and visual disturbance.  Respiratory: Negative for shortness of breath.   Cardiovascular: Negative for chest pain.  Musculoskeletal: Negative for neck pain.  Neurological: Positive for headaches. Negative for dizziness, syncope and light-headedness.  Psychiatric/Behavioral: Positive for dysphoric mood and sleep disturbance.  All other systems reviewed and are negative.    Physical Exam Updated Vital Signs BP (!) 203/107 (BP Location: Right Arm)   Pulse 98   Temp 97.8 F (36.6 C) (Oral)   Resp 16   LMP 06/06/2010   SpO2 98%   Physical Exam  Constitutional: She is oriented to person, place, and time. She appears well-developed and well-nourished. No distress.  Tearful  HENT:  Head: Normocephalic and atraumatic.  Eyes: Conjunctivae are normal. Pupils are equal,  round, and reactive to light. Right eye exhibits no discharge. Left eye exhibits no discharge. No scleral icterus.  Neck: Normal range of motion.  Cardiovascular: Normal rate.  Pulmonary/Chest: Effort normal. No respiratory distress.  Abdominal: She exhibits no distension.  Neurological: She is alert and oriented to person, place, and time.  Lying on stretcher in NAD. GCS 15. Speaks in a clear voice. Cranial nerves II through XII grossly intact. 5/5 strength in all extremities. Sensation fully intact.  Bilateral finger-to-nose intact. Ambulatory    Skin: Skin is warm and dry.  Psychiatric: She has a  normal mood and affect. Her behavior is normal.  Nursing note and vitals reviewed.    ED Treatments / Results  Labs (all labs ordered are listed, but only abnormal results are displayed) Labs Reviewed  BASIC METABOLIC PANEL - Abnormal; Notable for the following components:      Result Value   Sodium 130 (*)    Chloride 98 (*)    CO2 21 (*)    BUN <5 (*)    All other components within normal limits  CBC WITH DIFFERENTIAL/PLATELET - Abnormal; Notable for the following components:   Platelets 451 (*)    All other components within normal limits    EKG  EKG Interpretation None       Radiology No results found.  Procedures Procedures (including critical care time)  Medications Ordered in ED Medications  sodium chloride 0.9 % bolus 1,000 mL (0 mLs Intravenous Stopped 10/05/17 0843)  ketorolac (TORADOL) 30 MG/ML injection 15 mg (15 mg Intravenous Given 10/05/17 0744)  diphenhydrAMINE (BENADRYL) injection 12.5 mg (12.5 mg Intravenous Given 10/05/17 0744)  metoCLOPramide (REGLAN) injection 10 mg (10 mg Intravenous Given 10/05/17 0743)  dexamethasone (DECADRON) injection 10 mg (10 mg Intravenous Given 10/05/17 0744)  lisinopril (PRINIVIL,ZESTRIL) tablet 5 mg (5 mg Oral Given 10/05/17 0955)     Initial Impression / Assessment and Plan / ED Course  I have reviewed the triage vital signs and the nursing notes.  Pertinent labs & imaging results that were available during my care of the patient were reviewed by me and considered in my medical decision making (see chart for details).  52 year old female presents with a headache. No red flag symptoms. Her blood pressure is markedly elevated (218/123) which is likely due to untreated BP, pain, stress. Migraine cocktail, fluids, labs, and dose of BP med ordered.  10:27 AM On recheck she is feeling better. BP is still elevated but improved (181/102). CBC is remarkable for thrombocytosis. BMP is remarkable for mild hyponatremia and  hypochloremia. Case management was consulted who obtained an appointment for the patient on 2/13 and she was given a 30 day supply of Lisinopril. Return precautions were given.   Final Clinical Impressions(s) / ED Diagnoses   Final diagnoses:  Bad headache  Uncontrolled hypertension    ED Discharge Orders    None       Bethel Born, PA-C 10/05/17 1040    Mackuen, Cindee Salt, MD 10/05/17 1450

## 2017-10-19 ENCOUNTER — Encounter (INDEPENDENT_AMBULATORY_CARE_PROVIDER_SITE_OTHER): Payer: Self-pay | Admitting: Physician Assistant

## 2017-10-19 ENCOUNTER — Ambulatory Visit (INDEPENDENT_AMBULATORY_CARE_PROVIDER_SITE_OTHER): Payer: Self-pay | Admitting: Physician Assistant

## 2017-10-19 VITALS — BP 158/111 | HR 89 | Temp 98.1°F | Resp 18 | Ht 66.0 in | Wt 115.0 lb

## 2017-10-19 DIAGNOSIS — K047 Periapical abscess without sinus: Secondary | ICD-10-CM

## 2017-10-19 DIAGNOSIS — I10 Essential (primary) hypertension: Secondary | ICD-10-CM

## 2017-10-19 MED ORDER — LISINOPRIL-HYDROCHLOROTHIAZIDE 20-12.5 MG PO TABS: 1.0000 | ORAL_TABLET | Freq: Every day | ORAL | 5 refills | Status: DC

## 2017-10-19 MED ORDER — ACETAMINOPHEN-CODEINE #3 300-30 MG PO TABS: 1.0000 | ORAL_TABLET | Freq: Every day | ORAL | 0 refills | Status: DC

## 2017-10-19 MED ORDER — NAPROXEN 500 MG PO TABS: 500.0000 mg | ORAL_TABLET | Freq: Two times a day (BID) | ORAL | 0 refills | Status: DC

## 2017-10-19 MED ORDER — AMOXICILLIN-POT CLAVULANATE 875-125 MG PO TABS: 1.0000 | ORAL_TABLET | Freq: Two times a day (BID) | ORAL | 0 refills | Status: DC

## 2017-10-19 MED FILL — AMOX-CLAV 875-125 MG TABLET: 875-125 | 10 days supply | Qty: 20 | Fill #0

## 2017-10-19 MED FILL — LISINOPRIL-HCTZ 20-12.5 MG: 20-12.5 | 30 days supply | Qty: 30 | Fill #0

## 2017-10-19 MED FILL — NAPROXEN 500 MG TABLET: 500 | 15 days supply | Qty: 30 | Fill #0

## 2017-10-19 MED FILL — ACETAMINOPHEN/COD #3 TABLET: 300-30 | 30 days supply | Qty: 30 | Fill #0

## 2017-10-19 NOTE — Patient Instructions (Signed)

## 2017-10-19 NOTE — Progress Notes (Signed)
Subjective:  Patient ID: Regina [name], female    DOB: Apr 27, 1966  Age: 52 y.o. MRN: [id number]  CC: dental abscess  HPI Regina [name] is a 52 y.o. female with a medical history of HTN presents with dental abscess. Has very poor dentition and feels that her dental abscess drained last night. Currently in pain but less so than when abscess was undrained. No facial, tongue, or throat swelling. No fever, chills, nausea, or vomiting.     Pt is hypertensive and currently taking lisinopril 5 mg. Does not think lisinopril has been able to control BP to at least 120/80 mmHg. Does not endorse CP, palpitations, SOB, HA, tingling, numbness, weakness, swelling, abdominal pain, rash, or GI/GU sxs.    Outpatient Medications Prior to Visit  Medication Sig Dispense Refill  . ibuprofen (ADVIL,MOTRIN) 200 MG tablet Take 200 mg by mouth every 6 (six) hours as needed for headache.    . lisinopril (PRINIVIL,ZESTRIL) 5 MG tablet Take 1 tablet (5 mg total) by mouth daily. 30 tablet 0  . acetaminophen (TYLENOL 8 HOUR) 650 MG CR tablet Take 1 tablet (650 mg total) by mouth every 8 (eight) hours as needed for pain. (Patient not taking: Reported on 10/05/2017) 30 tablet 5  . ibuprofen (ADVIL,MOTRIN) 600 MG tablet TAKE 1 TABLET BY MOUTH EVERY 8 HOURS AS NEEDED. (Patient not taking: Reported on 10/05/2017) 30 tablet 0   No facility-administered medications prior to visit.      ROS Review of Systems  Constitutional: Negative for chills, fever and malaise/fatigue.  HENT:       Dental pain  Eyes: Negative for blurred vision.  Respiratory: Negative for shortness of breath.   Cardiovascular: Negative for chest pain and palpitations.  Gastrointestinal: Negative for abdominal pain and nausea.  Genitourinary: Negative for dysuria and hematuria.  Musculoskeletal: Negative for joint pain and myalgias.  Skin: Negative for rash.  Neurological: Negative for tingling and headaches.  Psychiatric/Behavioral: Negative for  depression. The patient is not nervous/anxious.     Objective:  BP (!) 158/111 (BP Location: Left Arm, Patient Position: Sitting, Cuff Size: Normal)   Pulse 89   Temp 98.1 F (36.7 C) (Oral)   Resp 18   Ht 5\' 6"  (1.676 m)   Wt 115 lb (52.2 kg)   LMP 06/06/2010   SpO2 97%   BMI 18.56 kg/m   BP/Weight 10/19/2017 10/05/2017 10/04/2017  Systolic BP 158 181 218  Diastolic BP 111 102 123  Wt. (Lbs) 115 - 120  BMI 18.56 - 2343.59      Physical Exam  Constitutional: She is oriented to person, place, and time.  Well developed, well nourished, NAD, polite  HENT:  Head: Normocephalic and atraumatic.  Extremely poor dentition with multiple missing teeth, severely decayed teeth, and periodontal disease.  Eyes: No scleral icterus.  Neck: Normal range of motion. Neck supple. No thyromegaly present.  Cardiovascular: Normal rate, regular rhythm and normal heart sounds.  Pulmonary/Chest: Effort normal and breath sounds normal.  Musculoskeletal: She exhibits no edema.  Neurological: She is alert and oriented to person, place, and time. No cranial nerve deficit. Coordination normal.  Skin: Skin is warm and dry. No rash noted. No erythema. No pallor.  Psychiatric: She has a normal mood and affect. Her behavior is normal. Thought content normal.  Vitals reviewed.    Assessment & Plan:   1. Dental abscess - amoxicillin-clavulanate (AUGMENTIN) 875-125 MG tablet; Take 1 tablet by mouth 2 (two) times daily.  Dispense: 20 tablet; Refill: 0 -  naproxen (NAPROSYN) 500 MG tablet; Take 1 tablet (500 mg total) by mouth 2 (two) times daily with a meal.  Dispense: 30 tablet; Refill: 0 - acetaminophen-codeine (TYLENOL #3) 300-30 MG tablet; Take 1 tablet by mouth at bedtime.  Dispense: 30 tablet; Refill: 0 - Ambulatory referral to Dentistry  2. Hypertension, unspecified type - lisinopril-hydrochlorothiazide (ZESTORETIC) 20-12.5 MG tablet; Take 1 tablet by mouth daily.  Dispense: 30 tablet; Refill: 5 -  CBC with Differential - Comprehensive metabolic panel - TSH   Meds ordered this encounter  Medications  . amoxicillin-clavulanate (AUGMENTIN) 875-125 MG tablet    Sig: Take 1 tablet by mouth 2 (two) times daily.    Dispense:  20 tablet    Refill:  0    Order Specific Question:   Supervising Provider    Answer:   Quentin Angst [id number]  . naproxen (NAPROSYN) 500 MG tablet    Sig: Take 1 tablet (500 mg total) by mouth 2 (two) times daily with a meal.    Dispense:  30 tablet    Refill:  0    Order Specific Question:   Supervising Provider    Answer:   Quentin Angst [id number]  . acetaminophen-codeine (TYLENOL #3) 300-30 MG tablet    Sig: Take 1 tablet by mouth at bedtime.    Dispense:  30 tablet    Refill:  0    Order Specific Question:   Supervising Provider    Answer:   Quentin Angst [id number]  . lisinopril-hydrochlorothiazide (ZESTORETIC) 20-12.5 MG tablet    Sig: Take 1 tablet by mouth daily.    Dispense:  30 tablet    Refill:  5    Order Specific Question:   Supervising Provider    Answer:   Quentin Angst [id number]    Follow-up: Return in about 4 weeks (around 11/16/2017) for HTN.   Loletta Specter PA

## 2017-10-20 ENCOUNTER — Telehealth (INDEPENDENT_AMBULATORY_CARE_PROVIDER_SITE_OTHER): Payer: Self-pay | Admitting: *Deleted

## 2017-10-20 LAB — CBC WITH DIFFERENTIAL/PLATELET
BASOS ABS: 0.1 10*3/uL (ref 0.0–0.2)
Basos: 1 %
EOS (ABSOLUTE): 0.1 10*3/uL (ref 0.0–0.4)
EOS: 1 %
HEMATOCRIT: 38.9 % (ref 34.0–46.6)
HEMOGLOBIN: 13.5 g/dL (ref 11.1–15.9)
IMMATURE GRANULOCYTES: 0 %
Immature Grans (Abs): 0 10*3/uL (ref 0.0–0.1)
LYMPHS: 29 %
Lymphocytes Absolute: 2 10*3/uL (ref 0.7–3.1)
MCH: 29.9 pg (ref 26.6–33.0)
MCHC: 34.7 g/dL (ref 31.5–35.7)
MCV: 86 fL (ref 79–97)
MONOCYTES: 10 %
Monocytes Absolute: 0.7 10*3/uL (ref 0.1–0.9)
NEUTROS PCT: 59 %
Neutrophils Absolute: 4.3 10*3/uL (ref 1.4–7.0)
Platelets: 550 10*3/uL — ABNORMAL HIGH (ref 150–379)
RBC: 4.51 x10E6/uL (ref 3.77–5.28)
RDW: 14.3 % (ref 12.3–15.4)
WBC: 7.1 10*3/uL (ref 3.4–10.8)

## 2017-10-20 LAB — COMPREHENSIVE METABOLIC PANEL
ALT: 9 IU/L (ref 0–32)
AST: 21 IU/L (ref 0–40)
Albumin/Globulin Ratio: 1.5 (ref 1.2–2.2)
Albumin: 4.6 g/dL (ref 3.5–5.5)
Alkaline Phosphatase: 114 IU/L (ref 39–117)
BUN/Creatinine Ratio: 6 — ABNORMAL LOW (ref 9–23)
BUN: 4 mg/dL — AB (ref 6–24)
Bilirubin Total: <0.2 mg/dL (ref 0.0–1.2)
CALCIUM: 10.5 mg/dL — AB (ref 8.7–10.2)
CO2: 22 mmol/L (ref 20–29)
CREATININE: 0.68 mg/dL (ref 0.57–1.00)
Chloride: 92 mmol/L — ABNORMAL LOW (ref 96–106)
GFR calc Af Amer: 115 mL/min/{1.73_m2} (ref >59)
GFR, EST NON AFRICAN AMERICAN: 100 mL/min/{1.73_m2} (ref >59)
GLOBULIN, TOTAL: 3 g/dL (ref 1.5–4.5)
GLUCOSE: 97 mg/dL (ref 65–99)
Potassium: 5.1 mmol/L (ref 3.5–5.2)
SODIUM: 130 mmol/L — AB (ref 134–144)
Total Protein: 7.6 g/dL (ref 6.0–8.5)

## 2017-10-20 LAB — TSH: TSH: 1.5 u[IU]/mL (ref 0.450–4.500)

## 2017-10-20 NOTE — Telephone Encounter (Signed)
Patient verified DOB Patient is aware of thyroid being normal with sodium being a little low. Patient is advised to increase sodium and complete a recheck at the next visit. No further questions.

## 2017-10-20 NOTE — Telephone Encounter (Signed)
-----   Message from Loletta Specteroger David Gomez, PA-C sent at 10/20/2017  9:31 AM EST ----- Thyroid normal. Platelets are elevated likely due to inflammation from her teeth. Sodium a little low. Please advise to be more liberal with salt use to increase her sodium. I will recheck at a future appointment.

## 2017-11-16 ENCOUNTER — Ambulatory Visit (INDEPENDENT_AMBULATORY_CARE_PROVIDER_SITE_OTHER): Payer: Self-pay | Admitting: Physician Assistant

## 2017-12-12 MED FILL — LISINOPRIL-HCTZ 20-12.5 TAB: 20-12.5 | 30 days supply | Qty: 30 | Fill #1

## 2017-12-13 ENCOUNTER — Telehealth (INDEPENDENT_AMBULATORY_CARE_PROVIDER_SITE_OTHER): Payer: Self-pay | Admitting: Physician Assistant

## 2017-12-13 NOTE — Telephone Encounter (Signed)
PT called since she went to the

## 2017-12-13 NOTE — Telephone Encounter (Signed)
Patient scheduled an appointment. Regina [name] S Lannis Lichtenwalner, CMA

## 2017-12-13 NOTE — Telephone Encounter (Signed)
PT called since she said that she went to the hospital and they told her to talk to her pcp about prescribing medication for her appetite , so she asking to please call her back, please follow up

## 2018-01-09 ENCOUNTER — Ambulatory Visit (INDEPENDENT_AMBULATORY_CARE_PROVIDER_SITE_OTHER): Payer: Self-pay | Admitting: Physician Assistant

## 2018-01-09 ENCOUNTER — Encounter (INDEPENDENT_AMBULATORY_CARE_PROVIDER_SITE_OTHER): Payer: Self-pay | Admitting: Physician Assistant

## 2018-01-09 VITALS — BP 155/77 | HR 82 | Temp 98.0°F | Resp 18 | Ht 66.0 in | Wt 113.6 lb

## 2018-01-09 DIAGNOSIS — R634 Abnormal weight loss: Secondary | ICD-10-CM

## 2018-01-09 DIAGNOSIS — N951 Menopausal and female climacteric states: Secondary | ICD-10-CM

## 2018-01-09 DIAGNOSIS — I10 Essential (primary) hypertension: Secondary | ICD-10-CM

## 2018-01-09 MED ORDER — PAROXETINE HCL 10 MG PO TABS: 10.0000 mg | ORAL_TABLET | Freq: Every day | ORAL | 5 refills | Status: DC

## 2018-01-09 MED ORDER — LISINOPRIL-HYDROCHLOROTHIAZIDE 20-12.5 MG PO TABS: 2.0000 | ORAL_TABLET | Freq: Every day | ORAL | 5 refills | Status: DC

## 2018-01-09 MED ORDER — MEGESTROL ACETATE 40 MG PO TABS: 80.0000 mg | ORAL_TABLET | Freq: Every day | ORAL | 2 refills | Status: DC

## 2018-01-09 MED FILL — PARoxetine HCL 10 MG TABS: 10 | 30 days supply | Qty: 30 | Fill #0

## 2018-01-09 MED FILL — MEGESTROL 40 MG TABLET: 40 | 30 days supply | Qty: 60 | Fill #0

## 2018-01-09 NOTE — Progress Notes (Signed)
Subjective:  Patient ID: Regina [name], female    DOB: 07/24/1966  Age: 52 y.o. MRN: [id number]  CC: medication management  HPI Jaysie Benthall is a 52 y.o. female with a medical history of HTN presents on f/u of HTN and dental pain. Last BP 158/111 mmHg. Prescribed Zestoretic 20-12.5 mg. BP     In regards to dental pain, pt states she had all her teeth pulled on the first week of April. Lost weight due to eating less after recovering from dental surgery.    Complains of hot flashes and is worse now with the increasing temperatures of the season. Having multiple hot flashes per day. Also feels more irritable. Denies depression and anxiety. Does not endorse any other symptoms or complaints.    Outpatient Medications Prior to Visit  Medication Sig Dispense Refill  . acetaminophen-codeine (TYLENOL #3) 300-30 MG tablet Take 1 tablet by mouth at bedtime. 30 tablet 0  . ibuprofen (ADVIL,MOTRIN) 200 MG tablet Take 200 mg by mouth every 6 (six) hours as needed for headache.    . lisinopril-hydrochlorothiazide (ZESTORETIC) 20-12.5 MG tablet Take 1 tablet by mouth daily. 30 tablet 5  . naproxen (NAPROSYN) 500 MG tablet Take 1 tablet (500 mg total) by mouth 2 (two) times daily with a meal. 30 tablet 0  . acetaminophen (TYLENOL 8 HOUR) 650 MG CR tablet Take 1 tablet (650 mg total) by mouth every 8 (eight) hours as needed for pain. (Patient not taking: Reported on 10/05/2017) 30 tablet 5  . amoxicillin-clavulanate (AUGMENTIN) 875-125 MG tablet Take 1 tablet by mouth 2 (two) times daily. 20 tablet 0  . ibuprofen (ADVIL,MOTRIN) 600 MG tablet TAKE 1 TABLET BY MOUTH EVERY 8 HOURS AS NEEDED. (Patient not taking: Reported on 10/05/2017) 30 tablet 0   No facility-administered medications prior to visit.      ROS Review of Systems  Constitutional: Positive for weight loss. Negative for chills, fever and malaise/fatigue.  Eyes: Negative for blurred vision.  Respiratory: Negative for shortness of breath.    Cardiovascular: Negative for chest pain and palpitations.  Gastrointestinal: Negative for abdominal pain and nausea.  Genitourinary: Negative for dysuria and hematuria.  Musculoskeletal: Negative for joint pain and myalgias.  Skin: Negative for rash.  Neurological: Negative for tingling and headaches.  Psychiatric/Behavioral: Negative for depression. The patient is not nervous/anxious.     Objective:  Ht  (1.676 m)   Wt 113 lb 9.6 oz (51.5 kg)   LMP 06/06/2010   BMI 18.34 kg/m   Vitals:   01/09/18 1029  BP: (!) 155/77  Pulse: 82  Resp: 18  Temp: 98 F (36.7 C)  SpO2: 98%     Physical Exam  Constitutional: She is oriented to person, place, and time.  Well developed, well nourished, NAD, polite  HENT:  Head: Normocephalic and atraumatic.  Eyes: No scleral icterus.  Neck: Normal range of motion. Neck supple. No thyromegaly present.  Cardiovascular: Normal rate, regular rhythm and normal heart sounds.  Pulmonary/Chest: Effort normal and breath sounds normal.  Musculoskeletal: She exhibits no edema.  Neurological: She is alert and oriented to person, place, and time.  Skin: Skin is warm and dry. No rash noted. No erythema. No pallor.  Psychiatric: She has a normal mood and affect. Her behavior is normal. Thought content normal.  Vitals reviewed.    Assessment & Plan:    1. Hypertension, unspecified type - Increase Zesterotic to two tablets qam.  2. Menopausal and female climacteric states - PARoxetine (PAXIL)  10 MG tablet; Take 1 tablet (10 mg total) by mouth daily.  Dispense: 30 tablet; Refill: 5  3. Loss of weigh - megestrol (MEGACE) 40 MG tablet; Take 2 tablets (80 mg total) by mouth daily.  Dispense: 60 tablet; Refill: 2   Meds ordered this encounter  Medications  . PARoxetine (PAXIL) 10 MG tablet    Sig: Take 1 tablet (10 mg total) by mouth daily.    Dispense:  30 tablet    Refill:  5    Order Specific Question:   Supervising Provider    Answer:    Quentin Angst [id number]  . megestrol (MEGACE) 40 MG tablet    Sig: Take 2 tablets (80 mg total) by mouth daily.    Dispense:  60 tablet    Refill:  2    Order Specific Question:   Supervising Provider    Answer:   Quentin Angst [id number]    Follow-up: Return in about 3 months (around 04/03/2018) for weight loss.   Loletta Specter PA

## 2018-01-09 NOTE — Patient Instructions (Signed)
Menopause Menopause is the normal time of life when menstrual periods stop completely. Menopause is complete when you have missed 12 consecutive menstrual periods. It usually occurs between the ages of 48 years and 55 years. Very rarely does a woman develop menopause before the age of 40 years. At menopause, your ovaries stop producing the female hormones estrogen and progesterone. This can cause undesirable symptoms and also affect your health. Sometimes the symptoms may occur 4-5 years before the menopause begins. There is no relationship between menopause and:  Oral contraceptives.  Number of children you had.  Race.  The age your menstrual periods started (menarche).  Heavy smokers and very thin women may develop menopause earlier in life. What are the causes?  The ovaries stop producing the female hormones estrogen and progesterone. Other causes include:  Surgery to remove both ovaries.  The ovaries stop functioning for no known reason.  Tumors of the pituitary gland in the brain.  Medical disease that affects the ovaries and hormone production.  Radiation treatment to the abdomen or pelvis.  Chemotherapy that affects the ovaries.  What are the signs or symptoms?  Hot flashes.  Night sweats.  Decrease in sex drive.  Vaginal dryness and thinning of the vagina causing painful intercourse.  Dryness of the skin and developing wrinkles.  Headaches.  Tiredness.  Irritability.  Memory problems.  Weight gain.  Bladder infections.  Hair growth of the face and chest.  Infertility. More serious symptoms include:  Loss of bone (osteoporosis) causing breaks (fractures).  Depression.  Hardening and narrowing of the arteries (atherosclerosis) causing heart attacks and strokes.  How is this diagnosed?  When the menstrual periods have stopped for 12 straight months.  Physical exam.  Hormone studies of the blood. How is this treated? There are many treatment  choices and nearly as many questions about them. The decisions to treat or not to treat menopausal changes is an individual choice made with your health care provider. Your health care provider can discuss the treatments with you. Together, you can decide which treatment will work best for you. Your treatment choices may include:  Hormone therapy (estrogen and progesterone).  Non-hormonal medicines.  Treating the individual symptoms with medicine (for example antidepressants for depression).  Herbal medicines that may help specific symptoms.  Counseling by a psychiatrist or psychologist.  Group therapy.  Lifestyle changes including: ? Eating healthy. ? Regular exercise. ? Limiting caffeine and alcohol. ? Stress management and meditation.  No treatment.  Follow these instructions at home:  Take the medicine your health care provider gives you as directed.  Get plenty of sleep and rest.  Exercise regularly.  Eat a diet that contains calcium (good for the bones) and soy products (acts like estrogen hormone).  Avoid alcoholic beverages.  Do not smoke.  If you have hot flashes, dress in layers.  Take supplements, calcium, and vitamin D to strengthen bones.  You can use over-the-counter lubricants or moisturizers for vaginal dryness.  Group therapy is sometimes very helpful.  Acupuncture may be helpful in some cases. Contact a health care provider if:  You are not sure you are in menopause.  You are having menopausal symptoms and need advice and treatment.  You are still having menstrual periods after age 55 years.  You have pain with intercourse.  Menopause is complete (no menstrual period for 12 months) and you develop vaginal bleeding.  You need a referral to a specialist (gynecologist, psychiatrist, or psychologist) for treatment. Get help right   away if:  You have severe depression.  You have excessive vaginal bleeding.  You fell and think you have a  broken bone.  You have pain when you urinate.  You develop leg or chest pain.  You have a fast pounding heart beat (palpitations).  You have severe headaches.  You develop vision problems.  You feel a lump in your breast.  You have abdominal pain or severe indigestion. This information is not intended to replace advice given to you by your health care provider. Make sure you discuss any questions you have with your health care provider. Document Released: 11/13/2003 Document Revised: 01/29/2016 Document Reviewed: 03/22/2013 Elsevier Interactive Patient Education  2017 [employer]  

## 2018-01-16 MED FILL — LISINOPRIL-HCTZ 20-12.5 TAB: 20-12.5 | 30 days supply | Qty: 30 | Fill #2

## 2018-01-27 ENCOUNTER — Ambulatory Visit: Payer: Self-pay

## 2018-02-08 MED FILL — MEGESTROL 40 MG TABLET: 40 | 30 days supply | Qty: 60 | Fill #1

## 2018-02-13 MED FILL — LISINOPRIL-HCTZ 20-12.5 MG: 20-12.5 | 30 days supply | Qty: 30 | Fill #3

## 2018-02-17 ENCOUNTER — Ambulatory Visit: Payer: Self-pay

## 2018-03-20 MED FILL — LISINOPRIL-HCTZ 20-12.5 MG: 20-12.5 | 30 days supply | Qty: 30 | Fill #4

## 2018-04-03 ENCOUNTER — Ambulatory Visit (INDEPENDENT_AMBULATORY_CARE_PROVIDER_SITE_OTHER): Payer: Self-pay | Admitting: Physician Assistant

## 2018-04-13 ENCOUNTER — Other Ambulatory Visit: Payer: Self-pay

## 2018-04-13 ENCOUNTER — Encounter (INDEPENDENT_AMBULATORY_CARE_PROVIDER_SITE_OTHER): Payer: Self-pay | Admitting: Physician Assistant

## 2018-04-13 ENCOUNTER — Ambulatory Visit (INDEPENDENT_AMBULATORY_CARE_PROVIDER_SITE_OTHER): Payer: Self-pay | Admitting: Physician Assistant

## 2018-04-13 ENCOUNTER — Encounter (HOSPITAL_COMMUNITY): Payer: Self-pay | Admitting: Emergency Medicine

## 2018-04-13 ENCOUNTER — Emergency Department (HOSPITAL_COMMUNITY)
Admission: EM | Admit: 2018-04-13 | Discharge: 2018-04-13 | Disposition: A | Discharge status: Home / Self Care | Payer: Self-pay | Attending: Emergency Medicine | Admitting: Emergency Medicine

## 2018-04-13 VITALS — BP 170/102 | HR 95 | Temp 98.4°F | Ht 66.0 in | Wt 120.6 lb

## 2018-04-13 DIAGNOSIS — F418 Other specified anxiety disorders: Secondary | ICD-10-CM

## 2018-04-13 DIAGNOSIS — Z79899 Other long term (current) drug therapy: Secondary | ICD-10-CM | POA: Insufficient documentation

## 2018-04-13 DIAGNOSIS — I10 Essential (primary) hypertension: Secondary | ICD-10-CM | POA: Insufficient documentation

## 2018-04-13 MED ORDER — AMLODIPINE BESYLATE 5 MG PO TABS: 5.0000 mg | ORAL_TABLET | Freq: Every day | ORAL | 5 refills | Status: DC

## 2018-04-13 MED ORDER — CLONIDINE HCL 0.1 MG PO TABS
0.1000 mg | ORAL_TABLET | Freq: Once | ORAL | Status: AC
  Administered 2018-04-13: 0.1 mg via ORAL

## 2018-04-13 MED ORDER — PAROXETINE HCL 40 MG PO TABS: 40.0000 mg | ORAL_TABLET | ORAL | 5 refills | Status: DC

## 2018-04-13 MED FILL — AMLODIPINE BESYLATE 5 MG TA: 5 | 30 days supply | Qty: 30 | Fill #0

## 2018-04-13 NOTE — Patient Instructions (Signed)
Managing Your Hypertension Hypertension is commonly called high blood pressure. This is when the force of your blood pressing against the walls of your arteries is too strong. Arteries are blood vessels that carry blood from your heart throughout your body. Hypertension forces the heart to work harder to pump blood, and may cause the arteries to become narrow or stiff. Having untreated or uncontrolled hypertension can cause heart attack, stroke, kidney disease, and other problems. What are blood pressure readings? A blood pressure reading consists of a higher number over a lower number. Ideally, your blood pressure should be below 120/80. The first ("top") number is called the systolic pressure. It is a measure of the pressure in your arteries as your heart beats. The second ("bottom") number is called the diastolic pressure. It is a measure of the pressure in your arteries as the heart relaxes. What does my blood pressure reading mean? Blood pressure is classified into four stages. Based on your blood pressure reading, your health care provider may use the following stages to determine what type of treatment you need, if any. Systolic pressure and diastolic pressure are measured in a unit called mm Hg. Normal  Systolic pressure: below 120.  Diastolic pressure: below 80. Elevated  Systolic pressure: 120-129.  Diastolic pressure: below 80. Hypertension stage 1  Systolic pressure: 130-139.  Diastolic pressure: 80-89. Hypertension stage 2  Systolic pressure: 140 or above.  Diastolic pressure: 90 or above. What health risks are associated with hypertension? Managing your hypertension is an important responsibility. Uncontrolled hypertension can lead to:  A heart attack.  A stroke.  A weakened blood vessel (aneurysm).  Heart failure.  Kidney damage.  Eye damage.  Metabolic syndrome.  Memory and concentration problems.  What changes can I make to manage my  hypertension? Hypertension can be managed by making lifestyle changes and possibly by taking medicines. Your health care provider will help you make a plan to bring your blood pressure within a normal range. Eating and drinking  Eat a diet that is high in fiber and potassium, and low in salt (sodium), added sugar, and fat. An example eating plan is called the DASH (Dietary Approaches to Stop Hypertension) diet. To eat this way: ? Eat plenty of fresh fruits and vegetables. Try to fill half of your plate at each meal with fruits and vegetables. ? Eat whole grains, such as whole wheat pasta, brown rice, or whole grain bread. Fill about one quarter of your plate with whole grains. ? Eat low-fat diary products. ? Avoid fatty cuts of meat, processed or cured meats, and poultry with skin. Fill about one quarter of your plate with lean proteins such as fish, chicken without skin, beans, eggs, and tofu. ? Avoid premade and processed foods. These tend to be higher in sodium, added sugar, and fat.  Reduce your daily sodium intake. Most people with hypertension should eat less than 1,500 mg of sodium a day.  Limit alcohol intake to no more than 1 drink a day for nonpregnant women and 2 drinks a day for men. One drink equals 12 oz of beer, 5 oz of wine, or 1 oz of hard liquor. Lifestyle  Work with your health care provider to maintain a healthy body weight, or to lose weight. Ask what an ideal weight is for you.  Get at least 30 minutes of exercise that causes your heart to beat faster (aerobic exercise) most days of the week. Activities may include walking, swimming, or biking.  Include exercise   to strengthen your muscles (resistance exercise), such as weight lifting, as part of your weekly exercise routine. Try to do these types of exercises for 30 minutes at least 3 days a week.  Do not use any products that contain nicotine or tobacco, such as cigarettes and e-cigarettes. If you need help quitting, ask  your health care provider.  Control any long-term (chronic) conditions you have, such as high cholesterol or diabetes. Monitoring  Monitor your blood pressure at home as told by your health care provider. Your personal target blood pressure may vary depending on your medical conditions, your age, and other factors.  Have your blood pressure checked regularly, as often as told by your health care provider. Working with your health care provider  Review all the medicines you take with your health care provider because there may be side effects or interactions.  Talk with your health care provider about your diet, exercise habits, and other lifestyle factors that may be contributing to hypertension.  Visit your health care provider regularly. Your health care provider can help you create and adjust your plan for managing hypertension. Will I need medicine to control my blood pressure? Your health care provider may prescribe medicine if lifestyle changes are not enough to get your blood pressure under control, and if:  Your systolic blood pressure is 130 or higher.  Your diastolic blood pressure is 80 or higher.  Take medicines only as told by your health care provider. Follow the directions carefully. Blood pressure medicines must be taken as prescribed. The medicine does not work as well when you skip doses. Skipping doses also puts you at risk for problems. Contact a health care provider if:  You think you are having a reaction to medicines you have taken.  You have repeated (recurrent) headaches.  You feel dizzy.  You have swelling in your ankles.  You have trouble with your vision. Get help right away if:  You develop a severe headache or confusion.  You have unusual weakness or numbness, or you feel faint.  You have severe pain in your chest or abdomen.  You vomit repeatedly.  You have trouble breathing. Summary  Hypertension is when the force of blood pumping through  your arteries is too strong. If this condition is not controlled, it may put you at risk for serious complications.  Your personal target blood pressure may vary depending on your medical conditions, your age, and other factors. For most people, a normal blood pressure is less than 120/80.  Hypertension is managed by lifestyle changes, medicines, or both. Lifestyle changes include weight loss, eating a healthy, low-sodium diet, exercising more, and limiting alcohol. This information is not intended to replace advice given to you by your health care provider. Make sure you discuss any questions you have with your health care provider. Document Released: 05/17/2012 Document Revised: 07/21/2016 Document Reviewed: 07/21/2016 Elsevier Interactive Patient Education  2018 [employer]  

## 2018-04-13 NOTE — Progress Notes (Signed)
Subjective:  Patient ID: Regina [name], female    DOB: November 15, 1965  Age: 52 y.o. MRN: [id number]  CC: HTN  HPI  Regina [name] is a 52 y.o. female with a medical history of HTN presents on f/u of HTN. Last BP 155/77 mmHg three months ago. Was advised to increase Zestoretic to two tablets qam. BP 170/102 mmHg today. Says she has incredible stress from taking care of her autistic brother. Goes to a psychologist/therapist which also agrees her BP is elevated due to stress. Taking paroxetine 10 mg which is helping with her hot flashes but not her mood. PHQ9 four and GAD7 seven in clinic today. No suicidal or homicidal intent/ideation. Does not endorse CP, palpitations, SOB, HA, weakness, tingling, numbness, abdominal pain, f/c/n/v, rash, edema, or GI/GU sxs.     Outpatient Medications Prior to Visit  Medication Sig Dispense Refill  . lisinopril-hydrochlorothiazide (ZESTORETIC) 20-12.5 MG tablet Take 2 tablets by mouth daily. 60 tablet 5  . megestrol (MEGACE) 40 MG tablet Take 2 tablets (80 mg total) by mouth daily. 60 tablet 2  . PARoxetine (PAXIL) 10 MG tablet Take 1 tablet (10 mg total) by mouth daily. 30 tablet 5  . naproxen (NAPROSYN) 500 MG tablet Take 1 tablet (500 mg total) by mouth 2 (two) times daily with a meal. (Patient not taking: Reported on 04/13/2018) 30 tablet 0   No facility-administered medications prior to visit.      ROS Review of Systems  Constitutional: Negative for chills, fever and malaise/fatigue.  Eyes: Negative for blurred vision.  Respiratory: Negative for shortness of breath.   Cardiovascular: Negative for chest pain and palpitations.  Gastrointestinal: Negative for abdominal pain and nausea.  Genitourinary: Negative for dysuria and hematuria.  Musculoskeletal: Negative for joint pain and myalgias.  Skin: Negative for rash.  Neurological: Negative for tingling and headaches.  Psychiatric/Behavioral: Negative for depression. The patient has insomnia. The  patient is not nervous/anxious.        Stress    Objective:  BP (!) 170/102 (BP Location: Left Arm, Patient Position: Sitting, Cuff Size: Small)   Pulse 95   Temp 98.4 F (36.9 C) (Oral)   Ht 5\' 6"  (1.676 m)   Wt 120 lb 9.6 oz (54.7 kg)   LMP 06/06/2010   SpO2 100%   BMI 19.47 kg/m   BP/Weight 04/13/2018 01/09/2018 10/19/2017  Systolic BP 170 155 158  Diastolic BP 102 77 111  Wt. (Lbs) 120.6 113.6 115  BMI 19.47 18.34 18.56      Physical Exam  Constitutional: She is oriented to person, place, and time.  Well developed, well nourished, NAD, polite  HENT:  Head: Normocephalic and atraumatic.  Eyes: No scleral icterus.  Neck: Normal range of motion. Neck supple. No thyromegaly present.  Cardiovascular: Normal rate, regular rhythm and normal heart sounds.  Pulmonary/Chest: Effort normal and breath sounds normal.  Abdominal: Soft. Bowel sounds are normal. There is no tenderness.  Musculoskeletal: She exhibits no edema.  Neurological: She is alert and oriented to person, place, and time.  Skin: Skin is warm and dry. No rash noted. No erythema. No pallor.  Psychiatric: Her behavior is normal. Thought content normal.  Somewhat anxious  Vitals reviewed.    Assessment & Plan:    1. Essential hypertension - Pt advised to go to the ED due to elevated BP of 182/116 mmHg despite administration of clonidine. Pt states she will go to the emergency room upon leaving the clinic.  - Administered cloNIDine (CATAPRES) tablet  0.1 mg - Begin amLODipine (NORVASC) 5 MG tablet; Take 1 tablet (5 mg total) by mouth daily.  Dispense: 30 tablet; Refill: 5 - Continue Zestoretic 20 - 12.5 mg 2 tabs po qam  2. Depression with anxiety - Increase PARoxetine (PAXIL) 40 MG tablet; Take 1 tablet (40 mg total) by mouth every morning.  Dispense: 30 tablet; Refill: 5 - Continue working with therapist. Consider home care vs SNF for autistic brother.     Meds ordered this encounter  Medications  .  cloNIDine (CATAPRES) tablet 0.1 mg  . PARoxetine (PAXIL) 40 MG tablet    Sig: Take 1 tablet (40 mg total) by mouth every morning.    Dispense:  30 tablet    Refill:  5    Order Specific Question:   Supervising Provider    Answer:   Hoy RegisterNEWLIN, ENOBONG [[id number]]  . amLODipine (NORVASC) 5 MG tablet    Sig: Take 1 tablet (5 mg total) by mouth daily.    Dispense:  30 tablet    Refill:  5    Order Specific Question:   Supervising Provider    Answer:   Hoy RegisterNEWLIN, ENOBONG [[id number]]    Follow-up: Return in about 4 weeks (around 05/11/2018) for HTN and stess .   Loletta Specteroger David Gomez PA

## 2018-04-13 NOTE — ED Triage Notes (Signed)
Pt to ER sent by PCP for hypertension. States was systolic in the 170's, at this time BP 164/106. Pt denies s/s - asymptomatic. States was given a pill in the office, unsure what.

## 2018-04-13 NOTE — ED Provider Notes (Signed)
[facility]   CSN: [id number] Arrival date & time: 04/13/18  1137  History   Chief Complaint Chief Complaint  Patient presents with  . Hypertension    HPI Regina [name] is a 52 y.o. female.   HPI   52 year old female presents today with hypertension.  Patient has a history of hypertension, currently on lisinopril.  She followed up by her primary for evaluation of a significant elevated blood pressure 180/116.  She was given clonidine in the office which did not improve her symptoms.  She was asymptomatic at that time.  She notes that she has been taking medication, but reports that she has been under stress.  Denies any chest pain, shortness of breath or abdominal pain.  Past Medical History:  Diagnosis Date  . Hypertension     Patient Active Problem List   Diagnosis Date Noted  . HTN (hypertension) 01/13/2016  . Right anterior shoulder pain 07/18/2014  . Postconcussion syndrome 05/28/2014    History reviewed. No pertinent surgical history.   OB History   None      Home Medications    Prior to Admission medications   Medication Sig Start Date End Date Taking? Authorizing Provider  amLODipine (NORVASC) 5 MG tablet Take 1 tablet (5 mg total) by mouth daily. 04/13/18   Loletta SpecterGomez, Roger David, PA-C  lisinopril-hydrochlorothiazide (ZESTORETIC) 20-12.5 MG tablet Take 2 tablets by mouth daily. 01/09/18   Loletta SpecterGomez, Roger David, PA-C  PARoxetine (PAXIL) 40 MG tablet Take 1 tablet (40 mg total) by mouth every morning. 04/13/18   Loletta SpecterGomez, Roger David, PA-C    Family History Family History  Problem Relation Age of Onset  . Leukemia Mother   . Hypertension Mother   . Hypertension Father   . Cancer Neg Hx   . Diabetes Neg Hx   . Heart disease Neg Hx     Social History Social History   Tobacco Use  . Smoking status: Never Smoker  . Smokeless tobacco: Never Used  Substance Use Topics  . Alcohol use: Yes    Comment: social  .  Drug use: No     Allergies   Bee venom   Review of Systems Review of Systems  All other systems reviewed and are negative.    Physical Exam Updated Vital Signs BP (!) 146/95 (BP Location: Right Arm)   Pulse 79   Temp 98.2 F (36.8 C) (Oral)   Resp 18   LMP 06/06/2010   SpO2 99%   Physical Exam  Constitutional: She is oriented to person, place, and time. She appears well-developed and well-nourished.  HENT:  Head: Normocephalic and atraumatic.  Eyes: Pupils are equal, round, and reactive to light. Conjunctivae are normal. Right eye exhibits no discharge. Left eye exhibits no discharge. No scleral icterus.  Neck: Normal range of motion. No JVD present. No tracheal deviation present.  Cardiovascular: Normal rate, regular rhythm, normal heart sounds and intact distal pulses. Exam reveals no gallop and no friction rub.  No murmur heard. Pulmonary/Chest: Effort normal and breath sounds normal. No stridor. No respiratory distress. She has no wheezes. She has no rales. She exhibits no tenderness.  Neurological: She is alert and oriented to person, place, and time. Coordination normal.  Psychiatric: She has a normal mood and affect. Her behavior is normal. Judgment and thought content normal.  Nursing note and vitals reviewed.    ED Treatments / Results  Labs (all labs ordered are listed, but only abnormal results  are displayed) Labs Reviewed - No data to display  EKG None  Radiology No results found.  Procedures Procedures (including critical care time)  Medications Ordered in ED Medications - No data to display   Initial Impression / Assessment and Plan / ED Course  I have reviewed the triage vital signs and the nursing notes.  Pertinent labs & imaging results that were available during my care of the patient were reviewed by me and considered in my medical decision making (see chart for details).     Labs:    Imaging:  Consults:  Therapeutics:  Discharge Meds:   Assessment/Plan: 52 year old female presents today with hypertension. Patient is asymptomatic, blood pressure 146/95. She be discharged with outpatient follow-up encouragement to take previously prescribed medications and strict return precautions. She verbalized understanding and agreement to today's plan and had no further questions or concerns at the time discharge.     Final Clinical Impressions(s) / ED Diagnoses   Final diagnoses:  Hypertension, unspecified type    ED Discharge Orders    None       Rosalio Loud 04/13/18 1257    Melene Plan, DO 04/13/18 1505

## 2018-04-13 NOTE — Discharge Instructions (Addendum)
Please read attached information. If you experience any new or worsening signs or symptoms please return to the emergency room for evaluation. Please follow-up with your primary care provider or specialist as discussed.  Please use previously prescribed medication as directed. °

## 2018-04-15 ENCOUNTER — Emergency Department (HOSPITAL_COMMUNITY): Payer: Self-pay

## 2018-04-15 ENCOUNTER — Other Ambulatory Visit: Payer: Self-pay

## 2018-04-15 ENCOUNTER — Observation Stay (HOSPITAL_COMMUNITY)
Admission: EM | Admit: 2018-04-15 | Discharge: 2018-04-16 | Disposition: A | Discharge status: Home / Self Care | Payer: Self-pay | Attending: Family Medicine | Admitting: Family Medicine

## 2018-04-15 ENCOUNTER — Encounter (HOSPITAL_COMMUNITY): Payer: Self-pay | Admitting: Emergency Medicine

## 2018-04-15 DIAGNOSIS — R42 Dizziness and giddiness: Secondary | ICD-10-CM | POA: Insufficient documentation

## 2018-04-15 DIAGNOSIS — F32A Depression, unspecified: Secondary | ICD-10-CM | POA: Diagnosis present

## 2018-04-15 DIAGNOSIS — E871 Hypo-osmolality and hyponatremia: Principal | ICD-10-CM | POA: Diagnosis present

## 2018-04-15 DIAGNOSIS — I10 Essential (primary) hypertension: Secondary | ICD-10-CM | POA: Diagnosis present

## 2018-04-15 DIAGNOSIS — F419 Anxiety disorder, unspecified: Secondary | ICD-10-CM | POA: Insufficient documentation

## 2018-04-15 DIAGNOSIS — F329 Major depressive disorder, single episode, unspecified: Secondary | ICD-10-CM | POA: Insufficient documentation

## 2018-04-15 DIAGNOSIS — Z79899 Other long term (current) drug therapy: Secondary | ICD-10-CM | POA: Insufficient documentation

## 2018-04-15 DIAGNOSIS — I160 Hypertensive urgency: Secondary | ICD-10-CM

## 2018-04-15 DIAGNOSIS — Z91030 Bee allergy status: Secondary | ICD-10-CM | POA: Insufficient documentation

## 2018-04-15 LAB — CBC
HEMATOCRIT: 37.2 % (ref 36.0–46.0)
Hemoglobin: 13 g/dL (ref 12.0–15.0)
MCH: 29.9 pg (ref 26.0–34.0)
MCHC: 34.9 g/dL (ref 30.0–36.0)
MCV: 85.5 fL (ref 78.0–100.0)
Platelets: 447 10*3/uL — ABNORMAL HIGH (ref 150–400)
RBC: 4.35 MIL/uL (ref 3.87–5.11)
RDW: 12.9 % (ref 11.5–15.5)
WBC: 11 10*3/uL — ABNORMAL HIGH (ref 4.0–10.5)

## 2018-04-15 LAB — BASIC METABOLIC PANEL
Anion gap: 14 (ref 5–15)
Anion gap: 15 (ref 5–15)
BUN: 10 mg/dL (ref 6–20)
BUN: 11 mg/dL (ref 6–20)
CALCIUM: 9.7 mg/dL (ref 8.9–10.3)
CHLORIDE: 84 mmol/L — AB (ref 98–111)
CO2: 19 mmol/L — AB (ref 22–32)
CO2: 21 mmol/L — ABNORMAL LOW (ref 22–32)
Calcium: 9.7 mg/dL (ref 8.9–10.3)
Chloride: 84 mmol/L — ABNORMAL LOW (ref 98–111)
Creatinine, Ser: 0.68 mg/dL (ref 0.44–1.00)
Creatinine, Ser: 0.7 mg/dL (ref 0.44–1.00)
GFR calc Af Amer: >60 mL/min (ref >60)
GFR calc Af Amer: >60 mL/min (ref >60)
GFR calc non Af Amer: >60 mL/min (ref >60)
GFR calc non Af Amer: >60 mL/min (ref >60)
GLUCOSE: 101 mg/dL — AB (ref 70–99)
GLUCOSE: 97 mg/dL (ref 70–99)
POTASSIUM: 3.9 mmol/L (ref 3.5–5.1)
Potassium: 3 mmol/L — ABNORMAL LOW (ref 3.5–5.1)
SODIUM: 120 mmol/L — AB (ref 135–145)
Sodium: 117 mmol/L — CL (ref 135–145)

## 2018-04-15 LAB — I-STAT TROPONIN, ED: Troponin i, poc: 0 ng/mL (ref 0.00–0.08)

## 2018-04-15 MED ORDER — SODIUM CHLORIDE 0.9 % IV BOLUS
1000.0000 mL | Freq: Once | INTRAVENOUS | Status: AC
  Administered 2018-04-15: 1000 mL via INTRAVENOUS

## 2018-04-15 NOTE — H&P (Signed)
History and Physical    Regina [name] YQM:[id number] DOB: [birth year] DOA: 04/15/2018  Referring MD/NP/PA:   PCP: Loletta Specter, PA-C   Patient coming from:  The patient is coming from home.  At baseline, pt is independent for most of ADL.   Chief Complaint: Elevated blood pressure and dizziness  HPI: Regina [name] is a 52 y.o. female with medical history significant of hypertension, depression, who presents with elevated blood pressure and dizziness.  Patient states she drove from [place] to [place] this PM. She states that she traveled approximately 15 minutes in a hot car without air condition.  She becomes lightheaded and dizzy. Feels overheating. She was initially tremulous which has resolved completely.  No unilateral weakness, numbness or tingling his extremities.  No facial droop or slurred speech. She states that her blood pressure has been elevated recently. She was seen by her PCP recently and was started on new blood pressure medication amlodipine along with lisinopril.  Patient does not have chest pain, shortness of breath, cough, fever or chills.  No nausea, vomiting, diarrhea or abdominal pain.  No symptoms of UTI.   ED Course: pt was found to have WBC 11.0, negative troponin, sodium 117-->120, creatinine normal, temperature 99, blood pressure 198/119, tachycardia, slightly tachypnea, oxygen saturation 100% room air.  Chest x-ray with mild vascular congestion and chronic interstitial change.  Patient is placed on telemetry bed for observation.  Review of Systems:   General: no fevers, chills, no body weight gain, has poor appetite, has fatigue HEENT: no blurry vision, hearing changes or sore throat Respiratory: no dyspnea, coughing, wheezing CV: no chest pain, no palpitations GI: no nausea, vomiting, abdominal pain, diarrhea, constipation GU: no dysuria, burning on urination, increased urinary frequency, hematuria  Ext: no leg edema Neuro: no unilateral weakness,  numbness, or tingling, no vision change or hearing loss. Dizziness. Skin: no rash, no skin tear. MSK: No muscle spasm, no deformity, no limitation of range of movement in spin Heme: No easy bruising.  Travel history: No recent long distant travel.  Allergy:  Allergies  Allergen Reactions  . Bee Venom Hives    Past Medical History:  Diagnosis Date  . Hypertension     Past Surgical History:  Procedure Laterality Date  . dental procedure      Social History:  reports that she has never smoked. She has never used smokeless tobacco. She reports that she drinks alcohol. She reports that she does not use drugs.  Family History:  Family History  Problem Relation Age of Onset  . Leukemia Mother   . Hypertension Mother   . Hypertension Father   . Cancer Neg Hx   . Diabetes Neg Hx   . Heart disease Neg Hx      Prior to Admission medications   Medication Sig Start Date End Date Taking? Authorizing Provider  amLODipine (NORVASC) 5 MG tablet Take 1 tablet (5 mg total) by mouth daily. 04/13/18  Yes Loletta Specter, PA-C  lisinopril-hydrochlorothiazide (ZESTORETIC) 20-12.5 MG tablet Take 2 tablets by mouth daily. 01/09/18  Yes Loletta Specter, PA-C  PARoxetine (PAXIL) 40 MG tablet Take 1 tablet (40 mg total) by mouth every morning. Patient taking differently: Take 40 mg by mouth daily.  04/13/18  Yes Loletta Specter, PA-C    Physical Exam: Vitals:   04/15/18 2300 04/16/18 0000 04/16/18 0015 04/16/18 0030  BP: (!) 176/95  (!) 170/94 (!) 157/88  Pulse: 95 93 99 87  Resp: (!) 21 20 (!)  26 18  Temp:      TempSrc:      SpO2: 100% 99% 100% 100%  Weight:      Height:       General: Not in acute distress.  Dry mucous and membrane HEENT:       Eyes: PERRL, EOMI, no scleral icterus.       ENT: No discharge from the ears and nose, no pharynx injection, no tonsillar enlargement.        Neck: No JVD, no bruit, no mass felt. Heme: No neck lymph node enlargement. Cardiac: S1/S2, RRR,  No murmurs, No gallops or rubs. Respiratory:  No rales, wheezing, rhonchi or rubs. GI: Soft, nondistended, nontender, no rebound pain, no organomegaly, BS present. GU: No hematuria Ext: No pitting leg edema bilaterally. 2+DP/PT pulse bilaterally. Musculoskeletal: No joint deformities, No joint redness or warmth, no limitation of ROM in spin. Skin: No rashes.  Neuro: Alert, oriented X3, cranial nerves II-XII grossly intact, moves all extremities normally Psych: Patient is not psychotic, no suicidal or hemocidal ideation.  Labs on Admission: I have personally reviewed following labs and imaging studies  CBC: Recent Labs  Lab 04/15/18 2017  WBC 11.0*  HGB 13.0  HCT 37.2  MCV 85.5  PLT 447*   Basic Metabolic Panel: Recent Labs  Lab 04/15/18 2017 04/15/18 2224  NA 117* 120*  K 3.0* 3.9  CL 84* 84*  CO2 19* 21*  GLUCOSE 101* 97  BUN 10 11  CREATININE 0.68 0.70  CALCIUM 9.7 9.7   GFR: Estimated Creatinine Clearance: 69.7 mL/min (by C-G formula based on SCr of 0.7 mg/dL). Liver Function Tests: No results for input(s): AST, ALT, ALKPHOS, BILITOT, PROT, ALBUMIN in the last 168 hours. No results for input(s): LIPASE, AMYLASE in the last 168 hours. No results for input(s): AMMONIA in the last 168 hours. Coagulation Profile: No results for input(s): INR, PROTIME in the last 168 hours. Cardiac Enzymes: No results for input(s): CKTOTAL, CKMB, CKMBINDEX, TROPONINI in the last 168 hours. BNP (last 3 results) No results for input(s): PROBNP in the last 8760 hours. HbA1C: No results for input(s): HGBA1C in the last 72 hours. CBG: No results for input(s): GLUCAP in the last 168 hours. Lipid Profile: No results for input(s): CHOL, HDL, LDLCALC, TRIG, CHOLHDL, LDLDIRECT in the last 72 hours. Thyroid Function Tests: No results for input(s): TSH, T4TOTAL, FREET4, T3FREE, THYROIDAB in the last 72 hours. Anemia Panel: No results for input(s): VITAMINB12, FOLATE, FERRITIN, TIBC, IRON,  RETICCTPCT in the last 72 hours. Urine analysis:    Component Value Date/Time   COLORURINE COLORLESS (A) 10/04/2017 1918   APPEARANCEUR CLEAR 10/04/2017 1918   LABSPEC 1.002 (L) 10/04/2017 1918   PHURINE 6.0 10/04/2017 1918   GLUCOSEU NEGATIVE 10/04/2017 1918   HGBUR MODERATE (A) 10/04/2017 1918   BILIRUBINUR NEGATIVE 10/04/2017 1918   KETONESUR NEGATIVE 10/04/2017 1918   PROTEINUR NEGATIVE 10/04/2017 1918   NITRITE NEGATIVE 10/04/2017 1918   LEUKOCYTESUR TRACE (A) 10/04/2017 1918   Sepsis Labs: @LABRCNTIP (procalcitonin:4,lacticidven:4) )No results found for this or any previous visit (from the past 240 hour(s)).   Radiological Exams on Admission: Dg Chest 2 View  Result Date: 04/15/2018 CLINICAL DATA:  Acute onset of lightheadedness, trembling hands and high blood pressure. EXAM: CHEST - 2 VIEW COMPARISON:  Chest radiograph performed 07/23/2014 FINDINGS: The lungs are well-aerated. Scarring is noted at the lung apices. Mild vascular congestion is noted. Chronic interstitial changes are noted. There is no evidence of pleural effusion or pneumothorax.  The heart is normal in size; the mediastinal contour is within normal limits. No acute osseous abnormalities are seen. IMPRESSION: Mild vascular congestion noted; chronic interstitial changes noted. Scarring at the lung apices. Electronically Signed   By: Roanna RaiderJeffery  Chang M.D.   On: 04/15/2018 22:21     EKG: Independently reviewed.  Sinus rhythm, QTC 393, LAE, nonspecific T wave change.  Assessment/Plan Principal Problem:   Hyponatremia Active Problems:   HTN (hypertension)   Depression   Hyponatremia: Most likely due to dehydration and use of Zestoretic. Na=117-->120 on repeated BMP in ED. Pt was given 1L NS bolus in ED. Na increased from 117-->124 in 4 hours, which is too fast.  -will place in Tele bed for obs -start D5 at 50 cc/h - Will check urine sodium, urine osmolality, serum osmolality. - check TSH - Fluid restriction -  f/u by BMP q6h  HTN (hypertension): not controled. Bp 198/119. -hold Zestoretic due to hyponatremia -Increased amlodipine from 5 to 10 mg daily -IV hydralazine as needed  Depression and anxiety: Stable, no suicidal or homicidal ideations. -Continue home medications: Paxil   DVT ppx:  SQ Lovenox Code Status: Full code Family Communication: None at bed side.   Disposition Plan:  Anticipate discharge back to previous home environment Consults called:  none Admission status: Obs / tele      Date of Service 04/16/2018    Lorretta HarpXilin Labrandon Knoch [facility] Pager [phone]  If 7PM-7AM, please contact night-coverage [email or web address] Password [email or web address] 04/16/2018, 2:02 AM

## 2018-04-15 NOTE — ED Notes (Signed)
EDP notified of sodium level

## 2018-04-15 NOTE — ED Notes (Signed)
Patient transported to X-ray 

## 2018-04-15 NOTE — ED Triage Notes (Signed)
Reports being outside today and got too hot.  Now bp is elevated and she is "trembling all over".  Does endorse being a little anxious.  Denies having any pain.

## 2018-04-15 NOTE — ED Notes (Signed)
ED Provider at bedside. 

## 2018-04-15 NOTE — ED Provider Notes (Signed)
[facility] EMERGENCY DEPARTMENT Provider Note   CSN: [id number] Arrival date & time: 04/15/18  1958     History   Chief Complaint Chief Complaint  Patient presents with  . Hypertension    HPI Regina [name] is a 52 y.o. female.  The history is provided by the patient and medical records. No language interpreter was used.  Hypertension      52 year old female with history of hypertension currently on lisinopril presenting for evaluation of high blood pressure.  Patient states she was traveling from [place] to [place] today.  She mentioned being in a hot car without air condition and after approximately 45 minutes in the car, she felt lightheadedness, overheating, and tremulous.  Her symptom has since improved but she was concern for high blood pressure and decided to come to the ER.  She denies any associated pain.  Denies any headache, confusion, chest pain, shortness of breath, productive cough, abdominal pain, back pain, focal numbness or weakness.  She is not a smoker drinker.  She did mention that her blood pressures been running high, she was seen by her PCP recently and was started on a new blood pressure medication amlodipine along with lisinopril.  She did take medication this morning as prescribed.  Past Medical History:  Diagnosis Date  . Hypertension     Patient Active Problem List   Diagnosis Date Noted  . HTN (hypertension) 01/13/2016  . Right anterior shoulder pain 07/18/2014  . Postconcussion syndrome 05/28/2014    History reviewed. No pertinent surgical history.   OB History   None      Home Medications    Prior to Admission medications   Medication Sig Start Date End Date Taking? Authorizing Provider  amLODipine (NORVASC) 5 MG tablet Take 1 tablet (5 mg total) by mouth daily. 04/13/18   Loletta Specter, PA-C  lisinopril-hydrochlorothiazide (ZESTORETIC) 20-12.5 MG tablet Take 2 tablets by mouth daily. 01/09/18   Loletta Specter, PA-C  PARoxetine (PAXIL) 40 MG tablet Take 1 tablet (40 mg total) by mouth every morning. 04/13/18   Loletta Specter, PA-C    Family History Family History  Problem Relation Age of Onset  . Leukemia Mother   . Hypertension Mother   . Hypertension Father   . Cancer Neg Hx   . Diabetes Neg Hx   . Heart disease Neg Hx     Social History Social History   Tobacco Use  . Smoking status: Never Smoker  . Smokeless tobacco: Never Used  Substance Use Topics  . Alcohol use: Yes    Comment: social  . Drug use: No     Allergies   Bee venom   Review of Systems Review of Systems  All other systems reviewed and are negative.    Physical Exam Updated Vital Signs BP (!) 161/94 (BP Location: Right Arm)   Pulse 82   Temp 99 F (37.2 C) (Oral)   Resp (!) 25   Ht 5\' 6"  (1.676 m)   Wt 54.9 kg   LMP 06/06/2010   SpO2 100%   BMI 19.53 kg/m   Physical Exam  Constitutional: She appears well-developed and well-nourished. No distress.  HENT:  Head: Atraumatic.  Eyes: Conjunctivae are normal.  Neck: Neck supple.  Cardiovascular: Normal rate and regular rhythm.  Pulmonary/Chest: Effort normal. She has rales (Crackles heard at the lung bases).  Abdominal: Soft. Bowel sounds are normal. She exhibits no distension. There is no tenderness.  Musculoskeletal: She exhibits  no edema.  Neurological: She is alert.  Skin: No rash noted.  Psychiatric: She has a normal mood and affect.  Nursing note and vitals reviewed.    ED Treatments / Results  Labs (all labs ordered are listed, but only abnormal results are displayed) Labs Reviewed  CBC - Abnormal; Notable for the following components:      Result Value   WBC 11.0 (*)    Platelets 447 (*)    All other components within normal limits  BASIC METABOLIC PANEL - Abnormal; Notable for the following components:   Sodium 117 (*)    Potassium 3.0 (*)    Chloride 84 (*)    CO2 19 (*)    Glucose, Bld 101 (*)    All other  components within normal limits  BASIC METABOLIC PANEL - Abnormal; Notable for the following components:   Sodium 120 (*)    Chloride 84 (*)    CO2 21 (*)    All other components within normal limits  BRAIN NATRIURETIC PEPTIDE  OSMOLALITY, URINE  OSMOLALITY  CREATININE, URINE, RANDOM  SODIUM, URINE, RANDOM  TSH  BASIC METABOLIC PANEL  BASIC METABOLIC PANEL  BASIC METABOLIC PANEL  BASIC METABOLIC PANEL  I-STAT TROPONIN, ED    EKG EKG Interpretation  Date/Time:  Saturday April 15 2018 21:16:00 EDT Ventricular Rate:  84 PR Interval:    QRS Duration: 86 QT Interval:  353 QTC Calculation: 418 R Axis:   53 Text Interpretation:  Sinus rhythm Probable left atrial enlargement Confirmed by Kennis Carina [phone]) on 04/15/2018 9:20:30 PM   Radiology Dg Chest 2 View  Result Date: 04/15/2018 CLINICAL DATA:  Acute onset of lightheadedness, trembling hands and high blood pressure. EXAM: CHEST - 2 VIEW COMPARISON:  Chest radiograph performed 07/23/2014 FINDINGS: The lungs are well-aerated. Scarring is noted at the lung apices. Mild vascular congestion is noted. Chronic interstitial changes are noted. There is no evidence of pleural effusion or pneumothorax. The heart is normal in size; the mediastinal contour is within normal limits. No acute osseous abnormalities are seen. IMPRESSION: Mild vascular congestion noted; chronic interstitial changes noted. Scarring at the lung apices. Electronically Signed   By: Roanna Raider M.D.   On: 04/15/2018 22:21    Procedures .Critical Care Performed by: Fayrene Helper, PA-C Authorized by: Fayrene Helper, PA-C   Critical care provider statement:    Critical care time (minutes):  45   Critical care was time spent personally by me on the following activities:  Discussions with consultants, evaluation of patient's response to treatment, examination of patient, ordering and performing treatments and interventions, ordering and review of laboratory studies,  ordering and review of radiographic studies, pulse oximetry, re-evaluation of patient's condition, obtaining history from patient or surrogate and review of old charts   (including critical care time)  Medications Ordered in ED Medications  sodium chloride 0.9 % bolus 1,000 mL (0 mLs Intravenous Stopped 04/15/18 2313)     Initial Impression / Assessment and Plan / ED Course  I have reviewed the triage vital signs and the nursing notes.  Pertinent labs & imaging results that were available during my care of the patient were reviewed by me and considered in my medical decision making (see chart for details).     BP (!) 180/96   Pulse 92   Temp 99 F (37.2 C) (Oral)   Resp (!) 22   Ht 5\' 6"  (1.676 m)   Wt 54.9 kg   LMP 06/06/2010   SpO2  100%   BMI 19.53 kg/m    Final Clinical Impressions(s) / ED Diagnoses   Final diagnoses:  Hyponatremia  Hypertensive urgency    ED Discharge Orders    None     9:32 PM Patient voiced concern for high blood pressure.  States that she has been compliant with her blood pressure medication but today she felt overheated while she was driving in a car without air condition.  Somehow she attributed her symptoms to high blood pressure and decided to be evaluated in the ED.  Initially her blood pressure was elevated at 190 systolic.  He has since improved to 161 systolic.  Patient is resting comfortably.  She does not have any pain.  She does have some crackles in the lung base, chest x-ray ordered. Work up initiated.  11:58 PM Initial BMP shows hyponatremia with a sodium of 117.  Potassium of 3.0, chloride of 84, CO2 of 19.  Patient without seizure activities however on repeat BMP, sodium remains low at 120.  I discussed this with the patient and she mention her doctor is aware that her sodium has been running low. I discussed with Dr. Pilar PlateBero, who recommend admission for correction of sodium and further evaluation.  Since pt has crackles in lung base  and evidence of mild vascular congestion on CXR, will check BNP to assess for potential CHF causing hyponatremia. Pt doesn't appear fluid overload on exam.    12:04 AM Appreciate consultation from [facility] Dr. Clyde LundborgNiu who agrees to see and admit pt for further management of her condition.     Fayrene Helperran, Jailin Manocchio, PA-C 04/16/18 0007    Sabas SousBero, Michael M, MD 04/16/18 [phone]

## 2018-04-16 ENCOUNTER — Other Ambulatory Visit: Payer: Self-pay

## 2018-04-16 ENCOUNTER — Encounter (HOSPITAL_COMMUNITY): Payer: Self-pay | Admitting: Internal Medicine

## 2018-04-16 DIAGNOSIS — I160 Hypertensive urgency: Secondary | ICD-10-CM | POA: Insufficient documentation

## 2018-04-16 DIAGNOSIS — F32A Depression, unspecified: Secondary | ICD-10-CM | POA: Diagnosis present

## 2018-04-16 DIAGNOSIS — F329 Major depressive disorder, single episode, unspecified: Secondary | ICD-10-CM

## 2018-04-16 DIAGNOSIS — E871 Hypo-osmolality and hyponatremia: Principal | ICD-10-CM

## 2018-04-16 DIAGNOSIS — I10 Essential (primary) hypertension: Secondary | ICD-10-CM

## 2018-04-16 LAB — CBC
HEMATOCRIT: 35.7 % — AB (ref 36.0–46.0)
HEMOGLOBIN: 12.4 g/dL (ref 12.0–15.0)
MCH: 29.9 pg (ref 26.0–34.0)
MCHC: 34.7 g/dL (ref 30.0–36.0)
MCV: 86 fL (ref 78.0–100.0)
Platelets: 424 10*3/uL — ABNORMAL HIGH (ref 150–400)
RBC: 4.15 MIL/uL (ref 3.87–5.11)
RDW: 13.1 % (ref 11.5–15.5)
WBC: 8 10*3/uL (ref 4.0–10.5)

## 2018-04-16 LAB — BASIC METABOLIC PANEL
Anion gap: 12 (ref 5–15)
Anion gap: 10 (ref 5–15)
Anion gap: 9 (ref 5–15)
BUN: 9 mg/dL (ref 6–20)
BUN: 8 mg/dL (ref 6–20)
BUN: 7 mg/dL (ref 6–20)
CALCIUM: 9.2 mg/dL (ref 8.9–10.3)
CALCIUM: 9 mg/dL (ref 8.9–10.3)
CALCIUM: 9.1 mg/dL (ref 8.9–10.3)
CO2: 19 mmol/L — ABNORMAL LOW (ref 22–32)
CO2: 20 mmol/L — AB (ref 22–32)
CO2: 19 mmol/L — AB (ref 22–32)
CREATININE: 0.63 mg/dL (ref 0.44–1.00)
CREATININE: 0.59 mg/dL (ref 0.44–1.00)
CREATININE: 0.56 mg/dL (ref 0.44–1.00)
Chloride: 93 mmol/L — ABNORMAL LOW (ref 98–111)
Chloride: 95 mmol/L — ABNORMAL LOW (ref 98–111)
Chloride: 96 mmol/L — ABNORMAL LOW (ref 98–111)
GFR calc non Af Amer: >60 mL/min (ref >60)
GFR calc non Af Amer: >60 mL/min (ref >60)
GFR calc non Af Amer: >60 mL/min (ref >60)
GLUCOSE: 154 mg/dL — AB (ref 70–99)
Glucose, Bld: 148 mg/dL — ABNORMAL HIGH (ref 70–99)
Glucose, Bld: 147 mg/dL — ABNORMAL HIGH (ref 70–99)
Potassium: 3.6 mmol/L (ref 3.5–5.1)
Potassium: 3.4 mmol/L — ABNORMAL LOW (ref 3.5–5.1)
Potassium: 3.7 mmol/L (ref 3.5–5.1)
SODIUM: 124 mmol/L — AB (ref 135–145)
Sodium: 125 mmol/L — ABNORMAL LOW (ref 135–145)
Sodium: 124 mmol/L — ABNORMAL LOW (ref 135–145)

## 2018-04-16 LAB — OSMOLALITY: Osmolality: 262 mOsm/kg — ABNORMAL LOW (ref 275–295)

## 2018-04-16 LAB — OSMOLALITY, URINE: Osmolality, Ur: 107 mOsm/kg — ABNORMAL LOW (ref 300–900)

## 2018-04-16 LAB — SODIUM, URINE, RANDOM: SODIUM UR: 18 mmol/L

## 2018-04-16 LAB — CK: Total CK: 153 U/L (ref 38–234)

## 2018-04-16 LAB — HIV ANTIBODY (ROUTINE TESTING W REFLEX): HIV SCREEN 4TH GENERATION: NONREACTIVE

## 2018-04-16 LAB — CREATININE, URINE, RANDOM: CREATININE, URINE: 11.85 mg/dL

## 2018-04-16 LAB — TSH: TSH: 3.398 u[IU]/mL (ref 0.350–4.500)

## 2018-04-16 LAB — BRAIN NATRIURETIC PEPTIDE: B Natriuretic Peptide: 64.1 pg/mL (ref 0.0–100.0)

## 2018-04-16 LAB — GLUCOSE, CAPILLARY: Glucose-Capillary: 115 mg/dL — ABNORMAL HIGH (ref 70–99)

## 2018-04-16 MED ORDER — PAROXETINE HCL 20 MG PO TABS
40.0000 mg | ORAL_TABLET | Freq: Every day | ORAL | Status: DC
  Administered 2018-04-16: 40 mg via ORAL
  Filled 2018-04-16: qty 2

## 2018-04-16 MED ORDER — DEXTROSE 5 % IV SOLN
INTRAVENOUS | Status: DC
  Administered 2018-04-16: 07:00:00 via INTRAVENOUS

## 2018-04-16 MED ORDER — LISINOPRIL-HYDROCHLOROTHIAZIDE 20-12.5 MG PO TABS: 1.0000 | ORAL_TABLET | Freq: Every day | ORAL | 5 refills | Status: DC

## 2018-04-16 MED ORDER — ZOLPIDEM TARTRATE 5 MG PO TABS: 5.0000 mg | ORAL_TABLET | Freq: Every evening | ORAL | Status: DC | PRN

## 2018-04-16 MED ORDER — SODIUM CHLORIDE 0.9 % IV SOLN: INTRAVENOUS | Status: DC

## 2018-04-16 MED ORDER — ONDANSETRON HCL 4 MG PO TABS: 4.0000 mg | ORAL_TABLET | Freq: Four times a day (QID) | ORAL | Status: DC | PRN

## 2018-04-16 MED ORDER — ZOLPIDEM TARTRATE 5 MG PO TABS
5.0000 mg | ORAL_TABLET | Freq: Every evening | ORAL | Status: DC | PRN
  Administered 2018-04-16: 5 mg via ORAL
  Filled 2018-04-16: qty 1

## 2018-04-16 MED ORDER — HYDRALAZINE HCL 20 MG/ML IJ SOLN
10.0000 mg | Freq: Once | INTRAMUSCULAR | Status: DC
  Filled 2018-04-16: qty 1

## 2018-04-16 MED ORDER — AMLODIPINE BESYLATE 10 MG PO TABS
10.0000 mg | ORAL_TABLET | Freq: Every day | ORAL | Status: DC
  Administered 2018-04-16 (×2): 10 mg via ORAL
  Filled 2018-04-16 (×2): qty 1

## 2018-04-16 MED ORDER — ONDANSETRON HCL 4 MG/2ML IJ SOLN: 4.0000 mg | Freq: Four times a day (QID) | INTRAMUSCULAR | Status: DC | PRN

## 2018-04-16 MED ORDER — ACETAMINOPHEN 650 MG RE SUPP: 650.0000 mg | Freq: Four times a day (QID) | RECTAL | Status: DC | PRN

## 2018-04-16 MED ORDER — ENOXAPARIN SODIUM 40 MG/0.4ML ~~LOC~~ SOLN
40.0000 mg | SUBCUTANEOUS | Status: DC
  Administered 2018-04-16: 40 mg via SUBCUTANEOUS
  Filled 2018-04-16: qty 0.4

## 2018-04-16 MED ORDER — CARVEDILOL 6.25 MG PO TABS: 6.2500 mg | ORAL_TABLET | Freq: Two times a day (BID) | ORAL | 0 refills | Status: DC

## 2018-04-16 MED ORDER — SENNOSIDES-DOCUSATE SODIUM 8.6-50 MG PO TABS: 1.0000 | ORAL_TABLET | Freq: Every evening | ORAL | Status: DC | PRN

## 2018-04-16 MED ORDER — ACETAMINOPHEN 325 MG PO TABS: 650.0000 mg | ORAL_TABLET | Freq: Four times a day (QID) | ORAL | Status: DC | PRN

## 2018-04-16 MED ORDER — AMLODIPINE BESYLATE 10 MG PO TABS: 10.0000 mg | ORAL_TABLET | Freq: Every day | ORAL | 0 refills | Status: DC

## 2018-04-16 MED ORDER — HYDRALAZINE HCL 20 MG/ML IJ SOLN: 5.0000 mg | INTRAMUSCULAR | Status: DC | PRN

## 2018-04-16 NOTE — Progress Notes (Signed)
Patient discharged home per MD. NSL discontinued with catheter intact. Telemetry removed from patient. Patient advised of pending discharge instructions. Patient left without letting RN know and discharge instructions were not provided. Attempted to contact patient via telephone. No message left on voicemail. FYI message sent to MD.  Bess KindsGWALTNEY, Asli Tokarski B, RN

## 2018-04-16 NOTE — Progress Notes (Signed)
New Admission Note:  Arrival Method: By bed from ED around 0100 Mental Orientation: Alert and oriented Telemetry: Box 16, CCMD notified Assessment: Completed Skin: Completed, refer to flowsheets IV: Right forearm S.L. Pain: Denies Tubes: None Safety Measures: Safety Fall Prevention Plan was given, discussed and signed. Admission: Completed [facility] Orientation: Patient has been orientated to the room, unit and the staff. Family: None  Orders have been reviewed and implemented. Will continue to monitor the patient. Call light has been placed within reach. Alfonse Rashristy Nechemia Chiappetta, RN  Phone Number: [phone]

## 2018-04-16 NOTE — Discharge Summary (Signed)
Physician Discharge Summary  Regina [name] [id number] DOB: 1966-01-11 DOA: 04/15/2018  PCP: Loletta SpecterGomez, Roger David, PA-C  Admit date: 04/15/2018 Discharge date: 04/17/2018  Admitted From: Home Disposition: Home   Recommendations for Outpatient Follow-up:  1. Follow up with PCP in 1-2 weeks, recheck BP 2. Please obtain BMP/CBC in one week  Home Health: None Equipment/Devices: None Discharge Condition: Stable CODE STATUS: Full Diet recommendation: Heart healthy  Brief/Interim Summary: Regina CanesLabonnie Dengel is a 52 y.o. female with medical history significant of hypertension, depression, who presents with elevated blood pressure and dizziness.  [place]. She states that she traveled approximately 15 minutes in a hot car without air condition.  She becomes lightheaded and dizzy. Feels overheating. She was initially tremulous which has resolved completely.  No unilateral weakness, numbness or tingling his extremities.  No facial droop or slurred speech. She states that her blood pressure has been elevated recently. She was seen by her PCP recently and was started on new blood pressure medication amlodipine along with lisinopril.  Patient does not have chest pain, shortness of breath, cough, fever or chills.  No nausea, vomiting, diarrhea or abdominal pain.  No symptoms of UTI.   ED Course: pt was found to have WBC 11.0, negative troponin, sodium 117-->120, creatinine normal, temperature 99, blood pressure 198/119, tachycardia, slightly tachypnea, oxygen saturation 100% room air.  Chest x-ray with mild vascular congestion and chronic interstitial change.  Patient is placed on telemetry bed for observation.  ARB and HCTZ were held, normal saline provided with improvement in Na. To slow rate of rise, D5W was given with stability. BP   Discharge Diagnoses:  Principal Problem:   Hyponatremia Active Problems:   HTN (hypertension)    Depression  Hyponatremia: Most likely due to dehydration and use of Zestoretic. Na=117-->120 on repeated BMP in ED. Pt was given 1L NS bolus in ED. Na increased from 117-->124 in 4 hours, which is too fast. Given D5W at 50cc/hr with stability, Na 124 on recheck and mentating normally.  - Fluid and salt restriction reviewed w/pt.  - Follow up with PCP with BMP, cut zestoretic dose by half.  HTN (hypertension): Not controlled - Started coreg with good tolerance - Held zestoretic with improvement in Na, recommended she take 1/2 home dose until close follow up.  - Increased amlodipine from 5 to 10 mg daily  Depression and anxiety: Stable, no suicidal or homicidal ideations. -Continue home medications: Paxil  Discharge Instructions Discharge Instructions    Diet - low sodium heart healthy   Complete by:  As directed    Discharge instructions   Complete by:  As directed    - Increase the dose of norvasc to 10mg  daily (currently taking 5mg  daily).  - Also START taking coreg twice daily to help with blood pressure.  - Cut the dose of zestoretic that you're taking to only 1 tablet instead of 2 as this is likely the cause of your low sodium.  - Follow up with Dr. Lily KocherGomez in the next week.   If your symptoms return, seek medical attention right away.   Increase activity slowly   Complete by:  As directed      Allergies as of 04/16/2018      Reactions   Bee Venom Hives      Medication List    TAKE these medications   amLODipine 10 MG tablet Commonly known as:  NORVASC Take 1 tablet (10 mg total) by mouth daily.  What changed:    medication strength  how much to take   carvedilol 6.25 MG tablet Commonly known as:  COREG Take 1 tablet (6.25 mg total) by mouth 2 (two) times daily.   lisinopril-hydrochlorothiazide 20-12.5 MG tablet Commonly known as:  PRINZIDE,ZESTORETIC Take 1 tablet by mouth daily. What changed:  how much to take   PARoxetine 40 MG tablet Commonly known as:   PAXIL Take 1 tablet (40 mg total) by mouth every morning. What changed:  when to take this      Follow-up Information    Loletta Specter, PA-C. Schedule an appointment as soon as possible for a visit in 1 week(s).   Specialty:  Physician Assistant Contact information: [address] [place] [address] [phone]          Allergies  Allergen Reactions  . Bee Venom Hives    Consultations:  None  Procedures/Studies: Dg Chest 2 View  Result Date: 04/15/2018 CLINICAL DATA:  Acute onset of lightheadedness, trembling hands and high blood pressure. EXAM: CHEST - 2 VIEW COMPARISON:  Chest radiograph performed 07/23/2014 FINDINGS: The lungs are well-aerated. Scarring is noted at the lung apices. Mild vascular congestion is noted. Chronic interstitial changes are noted. There is no evidence of pleural effusion or pneumothorax. The heart is normal in size; the mediastinal contour is within normal limits. No acute osseous abnormalities are seen. IMPRESSION: Mild vascular congestion noted; chronic interstitial changes noted. Scarring at the lung apices. Electronically Signed   By: Roanna Raider M.D.   On: 04/15/2018 22:21    Subjective: Feels well. Wants to go home. No HA, vision changes, dyspnea, chest pain  Discharge Exam: Vitals:   04/16/18 0740 04/16/18 0927  BP: (!) 151/88 (!) 146/90  Pulse: 84 93  Resp: 16 18  Temp: 98.4 F (36.9 C) 98.8 F (37.1 C)  SpO2: 100% 100%   General: Pt is alert, awake, not in acute distress Cardiovascular: RRR, S1/S2 +, no rubs, no gallops Respiratory: CTA bilaterally, no wheezing, no rhonchi Abdominal: Soft, NT, ND, bowel sounds + Extremities: No edema, no cyanosis  Labs: BNP (last 3 results) Recent Labs    04/15/18 2017  BNP 64.1   Basic Metabolic Panel: Recent Labs  Lab 04/15/18 2017 04/15/18 2224 04/16/18 0227 04/16/18 0544 04/16/18 1139  NA 117* 120* 124* 125* 124*  K 3.0* 3.9 3.6 3.4* 3.7  CL 84* 84* 93* 95*  96*  CO2 19* 21* 19* 20* 19*  GLUCOSE 101* 97 148* 147* 154*  BUN 10 11 9 8 7   CREATININE 0.68 0.70 0.63 0.59 0.56  CALCIUM 9.7 9.7 9.2 9.0 9.1   Liver Function Tests: No results for input(s): AST, ALT, ALKPHOS, BILITOT, PROT, ALBUMIN in the last 168 hours. No results for input(s): LIPASE, AMYLASE in the last 168 hours. No results for input(s): AMMONIA in the last 168 hours. CBC: Recent Labs  Lab 04/15/18 2017 04/16/18 0544  WBC 11.0* 8.0  HGB 13.0 12.4  HCT 37.2 35.7*  MCV 85.5 86.0  PLT 447* 424*   Cardiac Enzymes: Recent Labs  Lab 04/16/18 0227  CKTOTAL 153   BNP: Invalid input(s): POCBNP CBG: Recent Labs  Lab 04/16/18 0509  GLUCAP 115*   D-Dimer No results for input(s): DDIMER in the last 72 hours. Hgb A1c No results for input(s): HGBA1C in the last 72 hours. Lipid Profile No results for input(s): CHOL, HDL, LDLCALC, TRIG, CHOLHDL, LDLDIRECT in the last 72 hours. Thyroid function studies Recent Labs  04/16/18 0544  TSH 3.398   Anemia work up No results for input(s): VITAMINB12, FOLATE, FERRITIN, TIBC, IRON, RETICCTPCT in the last 72 hours. Urinalysis    Component Value Date/Time   COLORURINE COLORLESS (A) 10/04/2017 1918   APPEARANCEUR CLEAR 10/04/2017 1918   LABSPEC 1.002 (L) 10/04/2017 1918   PHURINE 6.0 10/04/2017 1918   GLUCOSEU NEGATIVE 10/04/2017 1918   HGBUR MODERATE (A) 10/04/2017 1918   BILIRUBINUR NEGATIVE 10/04/2017 1918   KETONESUR NEGATIVE 10/04/2017 1918   PROTEINUR NEGATIVE 10/04/2017 1918   NITRITE NEGATIVE 10/04/2017 1918   LEUKOCYTESUR TRACE (A) 10/04/2017 1918    Microbiology No results found for this or any previous visit (from the past 240 hour(s)).  Time coordinating discharge: Approximately 40 minutes  Tyrone Nine, MD  [facility] 04/17/2018, 4:57 PM Pager [phone]

## 2018-04-16 NOTE — ED Notes (Signed)
Niu, MD at bedside. °

## 2018-04-16 NOTE — ED Notes (Signed)
No addl blood draw,  Pt enroute to inpatient floor. 

## 2018-04-16 NOTE — Plan of Care (Addendum)
Called to patient room d/t patient feeling sudden dizziness while lying down. Patient concerned that IVF was causing high blood pressure since this is how she feels when her bp is elevated. BP assessed 151/88. As patient continued to sit on side of bed dizziness resolved. BP reassessed 155/90. Will continue to monitor. Bess KindsGWALTNEY, Kaelan Amble B, RN

## 2018-04-18 ENCOUNTER — Telehealth (INDEPENDENT_AMBULATORY_CARE_PROVIDER_SITE_OTHER): Payer: Self-pay

## 2018-04-18 NOTE — Telephone Encounter (Signed)
-----   Message from Loletta Specteroger David Gomez, PA-C sent at 04/17/2018  8:56 AM EDT ----- HIV negative.

## 2018-04-18 NOTE — Telephone Encounter (Signed)
Left voicemail notifying patient of negative HIV. Maryjean Mornempestt S Trever Streater, CMA

## 2018-05-04 MED FILL — LISINOPRIL-HCTZ 20-12.5 MG: 20-12.5 | 30 days supply | Qty: 30 | Fill #5

## 2018-06-09 MED FILL — AMLODIPINE BESYLATE 5 MG TA: 5 | 30 days supply | Qty: 30 | Fill #1

## 2018-06-09 MED FILL — MEGESTROL 40 MG TABLET: 40 | 30 days supply | Qty: 60 | Fill #2

## 2018-10-09 MED FILL — AMLODIPINE BESYLATE 5 MG TA: 5 | 30 days supply | Qty: 30 | Fill #2

## 2018-11-29 MED FILL — AMLODIPINE BESYLATE 5 MG TA: 5 | 30 days supply | Qty: 30 | Fill #3

## 2019-01-02 MED FILL — AMLODIPINE BESYLATE 5 MG TA: 5 | 30 days supply | Qty: 30 | Fill #4

## 2019-03-05 MED FILL — ?AMLODIPINE BESYLATE 5MG TA: 5 | 30 days supply | Qty: 30 | Fill #5

## 2019-03-13 ENCOUNTER — Ambulatory Visit (INDEPENDENT_AMBULATORY_CARE_PROVIDER_SITE_OTHER): Payer: Self-pay | Admitting: Primary Care

## 2019-03-17 ENCOUNTER — Other Ambulatory Visit: Payer: Self-pay

## 2019-03-17 DIAGNOSIS — Z20822 Contact with and (suspected) exposure to covid-19: Secondary | ICD-10-CM

## 2019-03-23 LAB — NOVEL CORONAVIRUS, NAA: SARS-CoV-2, NAA: NOT DETECTED

## 2019-04-28 ENCOUNTER — Other Ambulatory Visit: Payer: Self-pay

## 2019-04-28 DIAGNOSIS — Z20822 Contact with and (suspected) exposure to covid-19: Secondary | ICD-10-CM

## 2019-04-29 LAB — NOVEL CORONAVIRUS, NAA: SARS-CoV-2, NAA: NOT DETECTED

## 2019-05-01 ENCOUNTER — Other Ambulatory Visit: Payer: Self-pay | Admitting: Pharmacist

## 2019-05-01 DIAGNOSIS — I10 Essential (primary) hypertension: Secondary | ICD-10-CM

## 2019-05-01 MED ORDER — AMLODIPINE BESYLATE 10 MG PO TABS: 10.0000 mg | ORAL_TABLET | Freq: Every day | ORAL | 0 refills | Status: DC

## 2019-05-01 MED FILL — ?AMLODIPINE BESYLATE 10 MG: 10 | 5 days supply | Qty: 5 | Fill #0

## 2019-05-02 ENCOUNTER — Emergency Department (HOSPITAL_COMMUNITY): Payer: Self-pay

## 2019-05-02 ENCOUNTER — Telehealth (HOSPITAL_COMMUNITY): Payer: Self-pay | Admitting: Physician Assistant

## 2019-05-02 ENCOUNTER — Other Ambulatory Visit: Payer: Self-pay

## 2019-05-02 ENCOUNTER — Emergency Department (HOSPITAL_COMMUNITY)
Admission: EM | Admit: 2019-05-02 | Discharge: 2019-05-02 | Disposition: A | Discharge status: Home / Self Care | Payer: Self-pay | Attending: Emergency Medicine | Admitting: Emergency Medicine

## 2019-05-02 DIAGNOSIS — I10 Essential (primary) hypertension: Secondary | ICD-10-CM | POA: Insufficient documentation

## 2019-05-02 DIAGNOSIS — Z79899 Other long term (current) drug therapy: Secondary | ICD-10-CM | POA: Insufficient documentation

## 2019-05-02 DIAGNOSIS — R0602 Shortness of breath: Secondary | ICD-10-CM | POA: Insufficient documentation

## 2019-05-02 DIAGNOSIS — I4892 Unspecified atrial flutter: Secondary | ICD-10-CM | POA: Insufficient documentation

## 2019-05-02 LAB — BASIC METABOLIC PANEL
Anion gap: 18 — ABNORMAL HIGH (ref 5–15)
BUN: <5 mg/dL — ABNORMAL LOW (ref 6–20)
CO2: 20 mmol/L — ABNORMAL LOW (ref 22–32)
Calcium: 9.8 mg/dL (ref 8.9–10.3)
Chloride: 90 mmol/L — ABNORMAL LOW (ref 98–111)
Creatinine, Ser: 0.61 mg/dL (ref 0.44–1.00)
GFR calc Af Amer: >60 mL/min (ref >60)
GFR calc non Af Amer: >60 mL/min (ref >60)
Glucose, Bld: 106 mg/dL — ABNORMAL HIGH (ref 70–99)
Potassium: 3.6 mmol/L (ref 3.5–5.1)
Sodium: 128 mmol/L — ABNORMAL LOW (ref 135–145)

## 2019-05-02 LAB — CBC
HCT: 40.5 % (ref 36.0–46.0)
Hemoglobin: 14.3 g/dL (ref 12.0–15.0)
MCH: 31.1 pg (ref 26.0–34.0)
MCHC: 35.3 g/dL (ref 30.0–36.0)
MCV: 88 fL (ref 80.0–100.0)
Platelets: 449 10*3/uL — ABNORMAL HIGH (ref 150–400)
RBC: 4.6 MIL/uL (ref 3.87–5.11)
RDW: 14.3 % (ref 11.5–15.5)
WBC: 9.2 10*3/uL (ref 4.0–10.5)
nRBC: 0 % (ref 0.0–0.2)

## 2019-05-02 LAB — I-STAT BETA HCG BLOOD, ED (MC, WL, AP ONLY): I-stat hCG, quantitative: <5 m[IU]/mL (ref <5)

## 2019-05-02 LAB — TROPONIN I (HIGH SENSITIVITY)
Troponin I (High Sensitivity): 8 ng/L (ref <18)
Troponin I (High Sensitivity): 11 ng/L (ref <18)

## 2019-05-02 LAB — TSH: TSH: 4.444 u[IU]/mL (ref 0.350–4.500)

## 2019-05-02 MED ORDER — SODIUM CHLORIDE 0.9% FLUSH: 3.0000 mL | Freq: Once | INTRAVENOUS | Status: DC

## 2019-05-02 MED ORDER — DILTIAZEM HCL-DEXTROSE 100-5 MG/100ML-% IV SOLN (PREMIX)
5.0000 mg/h | INTRAVENOUS | Status: DC
  Administered 2019-05-02: 5 mg/h via INTRAVENOUS
  Filled 2019-05-02: qty 100

## 2019-05-02 MED ORDER — ONDANSETRON HCL 4 MG/2ML IJ SOLN
4.0000 mg | Freq: Once | INTRAMUSCULAR | Status: AC
  Administered 2019-05-02: 04:00:00 4 mg via INTRAVENOUS
  Filled 2019-05-02: qty 2

## 2019-05-02 MED ORDER — METOPROLOL TARTRATE 5 MG/5ML IV SOLN
2.5000 mg | Freq: Once | INTRAVENOUS | Status: AC
  Administered 2019-05-02: 06:00:00 2.5 mg via INTRAVENOUS
  Filled 2019-05-02: qty 5

## 2019-05-02 MED ORDER — METOPROLOL TARTRATE 25 MG PO TABS: 25.0000 mg | ORAL_TABLET | Freq: Two times a day (BID) | ORAL | 0 refills | Status: DC

## 2019-05-02 MED ORDER — METOPROLOL TARTRATE 25 MG PO TABS
25.0000 mg | ORAL_TABLET | Freq: Once | ORAL | Status: AC
  Administered 2019-05-02: 06:00:00 25 mg via ORAL
  Filled 2019-05-02: qty 1

## 2019-05-02 MED ORDER — DILTIAZEM LOAD VIA INFUSION
10.0000 mg | Freq: Once | INTRAVENOUS | Status: AC
  Administered 2019-05-02: 04:00:00 10 mg via INTRAVENOUS
  Filled 2019-05-02: qty 10

## 2019-05-02 MED FILL — ?METOPROLOL 25 MG TABLET: 25 | 15 days supply | Qty: 30 | Fill #0

## 2019-05-02 NOTE — ED Triage Notes (Signed)
Per pt she has been having sob and and rapid heart beat that started tonight about 2 hrs ago. Pt is nauseated and hr is 150. Pt said no chest pain.

## 2019-05-02 NOTE — Telephone Encounter (Signed)
Spoke with patient, she is aware of appt 05/04/2019 @2  pm with Adline Peals.

## 2019-05-02 NOTE — ED Provider Notes (Signed)
[facility] EMERGENCY DEPARTMENT Provider Note   CSN: [id number] Arrival date & time: 05/02/19  1245     History   Chief Complaint Chief Complaint  Patient presents with  . Shortness of Breath  . Chest Pain    HPI Regina [name] is a 53 y.o. female.     Patient is a 53 year old female with past medical history of hypertension.  She presents today for evaluation of palpitations.  Patient states this evening she began with her heart fluttering.  She feels short of breath and weak, but denies any chest pain.  Patient has no prior cardiac history and only risk factor is hypertension.  She does report a similar episode several weeks ago while driving.  This lasted only a brief period of time, then resolved.  She denies excessive caffeine intake, but does report some recent stressors in her life.  The history is provided by the patient.  Palpitations Palpitations quality:  Fast Onset quality:  Sudden Timing:  Constant Progression:  Unchanged Chronicity:  New Relieved by:  Nothing Worsened by:  Nothing Ineffective treatments:  None tried   Past Medical History:  Diagnosis Date  . Hypertension     Patient Active Problem List   Diagnosis Date Noted  . Hyponatremia 04/16/2018  . Depression 04/16/2018  . Hypertensive urgency   . HTN (hypertension) 01/13/2016  . Right anterior shoulder pain 07/18/2014  . Postconcussion syndrome 05/28/2014    Past Surgical History:  Procedure Laterality Date  . dental procedure       OB History   No obstetric history on file.      Home Medications    Prior to Admission medications   Medication Sig Start Date End Date Taking? Authorizing Provider  amLODipine (NORVASC) 10 MG tablet Take 1 tablet (10 mg total) by mouth daily. 05/01/19   Charlott Rakes, MD  carvedilol (COREG) 6.25 MG tablet Take 1 tablet (6.25 mg total) by mouth 2 (two) times daily. 04/16/18 04/16/19  Patrecia Pour, MD  lisinopril-hydrochlorothiazide  (ZESTORETIC) 20-12.5 MG tablet Take 1 tablet by mouth daily. 04/16/18   Patrecia Pour, MD  PARoxetine (PAXIL) 40 MG tablet Take 1 tablet (40 mg total) by mouth every morning. Patient taking differently: Take 40 mg by mouth daily.  04/13/18   Clent Demark, PA-C    Family History Family History  Problem Relation Age of Onset  . Leukemia Mother   . Hypertension Mother   . Hypertension Father   . Cancer Neg Hx   . Diabetes Neg Hx   . Heart disease Neg Hx     Social History Social History   Tobacco Use  . Smoking status: Never Smoker  . Smokeless tobacco: Never Used  Substance Use Topics  . Alcohol use: Yes    Comment: social  . Drug use: No     Allergies   Bee venom   Review of Systems Review of Systems  Cardiovascular: Positive for palpitations.  All other systems reviewed and are negative.    Physical Exam Updated Vital Signs BP (!) 197/121 (BP Location: Right Arm)   Pulse (!) 158   Temp 98.2 F (36.8 C) (Oral)   Resp 18   LMP 06/06/2010   SpO2 98%   Physical Exam Vitals signs and nursing note reviewed.  Constitutional:      General: She is not in acute distress.    Appearance: She is well-developed. She is not diaphoretic.  HENT:     Head:  Normocephalic and atraumatic.  Neck:     Musculoskeletal: Normal range of motion and neck supple.  Cardiovascular:     Rate and Rhythm: Regular rhythm. Tachycardia present.     Heart sounds: No murmur. No friction rub. No gallop.   Pulmonary:     Effort: Pulmonary effort is normal. No respiratory distress.     Breath sounds: Normal breath sounds. No wheezing.  Abdominal:     General: Bowel sounds are normal. There is no distension.     Palpations: Abdomen is soft.     Tenderness: There is no abdominal tenderness.  Musculoskeletal: Normal range of motion.     Right lower leg: She exhibits no tenderness. No edema.     Left lower leg: She exhibits no tenderness. No edema.  Skin:    General: Skin is warm and  dry.  Neurological:     Mental Status: She is alert and oriented to person, place, and time.      ED Treatments / Results  Labs (all labs ordered are listed, but only abnormal results are displayed) Labs Reviewed  BASIC METABOLIC PANEL  CBC  I-STAT BETA HCG BLOOD, ED ([facility], [facility], [facility] ONLY)  TROPONIN I (HIGH SENSITIVITY)    EKG EKG Interpretation  Date/Time:  Wednesday May 02 2019 03:03:38 EDT Ventricular Rate:  157 PR Interval:    QRS Duration: 144 QT Interval:  304 QTC Calculation: 491 R Axis:   88 Text Interpretation:  Atrial flutter with 2:1 A-V conduction Non-specific intra-ventricular conduction block T wave abnormality, consider inferior ischemia T wave abnormality, consider anterior ischemia Abnormal ECG Confirmed by Geoffery LyonseLo, Bensyn Bornemann ([id number]) on 05/02/2019 3:29:53 AM   Radiology No results found.  Procedures Procedures (including critical care time)  Medications Ordered in ED Medications  sodium chloride flush (NS) 0.9 % injection 3 mL (has no administration in time range)  diltiazem (CARDIZEM) 1 mg/mL load via infusion 10 mg (has no administration in time range)    And  diltiazem (CARDIZEM) 100 mg in dextrose 5% 100mL (1 mg/mL) infusion (has no administration in time range)     Initial Impression / Assessment and Plan / ED Course  I have reviewed the triage vital signs and the nursing notes.  Pertinent labs & imaging results that were available during my care of the patient were reviewed by me and considered in my medical decision making (see chart for details).  Patient is a 53 year old female with only past medical history being hypertension presenting with palpitations.  She is found to be in atrial flutter with a heart rate of 150.  Started on a Cardizem drip with little improvement.  Cardiology was consulted at this point.  I spoke with Dr. Deforest HoylesAkhter recommending IV and p.o. metoprolol in addition to the Cardizem.  She was given 2.5 IV Lopressor and 25 mg of  oral Lopressor with conversion to a sinus rhythm.  Patient will be discharged to home with outpatient cardiology follow-up.  She will be started on a dose of metoprolol to take in the meantime.  Final Clinical Impressions(s) / ED Diagnoses   Final diagnoses:  None    ED Discharge Orders    None       Geoffery Lyonselo, Jara Feider, MD 05/02/19 [phone]

## 2019-05-02 NOTE — Discharge Instructions (Signed)
Begin taking metoprolol as prescribed.  You are to follow-up in the cardiology office in the next few days.  They are aware of your situation and should call today to make these arrangements.  If you have not heard from them, their contact information has been provided above for you to call and make these arrangements.  Return to the emergency department if your symptoms worsen or change.

## 2019-05-02 NOTE — Telephone Encounter (Signed)
Called and left message on mobile number for patient to call back to schedule New [facility] appt with [facility] per staff message from [facility].  Also tried to reach pt on home number listed but family member that answered (sounded like a child) stated pt was asleep.

## 2019-05-04 ENCOUNTER — Ambulatory Visit (HOSPITAL_COMMUNITY): Payer: Self-pay | Admitting: Physician Assistant

## 2019-05-04 ENCOUNTER — Encounter (HOSPITAL_COMMUNITY): Payer: Self-pay | Admitting: *Deleted

## 2019-05-07 ENCOUNTER — Ambulatory Visit (INDEPENDENT_AMBULATORY_CARE_PROVIDER_SITE_OTHER): Payer: Self-pay | Admitting: Primary Care

## 2019-05-25 ENCOUNTER — Other Ambulatory Visit: Payer: Self-pay | Admitting: Family Medicine

## 2019-05-25 DIAGNOSIS — I10 Essential (primary) hypertension: Secondary | ICD-10-CM

## 2019-06-05 ENCOUNTER — Other Ambulatory Visit: Payer: Self-pay

## 2019-06-05 ENCOUNTER — Ambulatory Visit (INDEPENDENT_AMBULATORY_CARE_PROVIDER_SITE_OTHER): Payer: Self-pay | Admitting: Primary Care

## 2019-06-05 ENCOUNTER — Encounter (INDEPENDENT_AMBULATORY_CARE_PROVIDER_SITE_OTHER): Payer: Self-pay | Admitting: Primary Care

## 2019-06-05 DIAGNOSIS — Z760 Encounter for issue of repeat prescription: Secondary | ICD-10-CM

## 2019-06-05 DIAGNOSIS — R002 Palpitations: Secondary | ICD-10-CM

## 2019-06-05 DIAGNOSIS — I10 Essential (primary) hypertension: Secondary | ICD-10-CM

## 2019-06-05 MED ORDER — METOPROLOL TARTRATE 25 MG PO TABS: 25.0000 mg | ORAL_TABLET | Freq: Two times a day (BID) | ORAL | 3 refills | Status: DC

## 2019-06-05 MED ORDER — AMLODIPINE BESYLATE 10 MG PO TABS: 10.0000 mg | ORAL_TABLET | Freq: Every day | ORAL | 3 refills | Status: DC
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
## 2019-06-06 ENCOUNTER — Emergency Department (HOSPITAL_COMMUNITY)
Admission: EM | Admit: 2019-06-06 | Discharge: 2019-06-06 | Discharge status: Against Medical Advice | Payer: Self-pay | Attending: Emergency Medicine | Admitting: Emergency Medicine

## 2019-06-06 ENCOUNTER — Encounter (HOSPITAL_COMMUNITY): Payer: Self-pay | Admitting: *Deleted

## 2019-06-06 ENCOUNTER — Other Ambulatory Visit: Payer: Self-pay

## 2019-06-06 DIAGNOSIS — M542 Cervicalgia: Secondary | ICD-10-CM | POA: Insufficient documentation

## 2019-06-06 DIAGNOSIS — G44319 Acute post-traumatic headache, not intractable: Secondary | ICD-10-CM

## 2019-06-06 DIAGNOSIS — R202 Paresthesia of skin: Secondary | ICD-10-CM | POA: Insufficient documentation

## 2019-06-06 DIAGNOSIS — Z5321 Procedure and treatment not carried out due to patient leaving prior to being seen by health care provider: Secondary | ICD-10-CM | POA: Insufficient documentation

## 2019-06-06 DIAGNOSIS — R51 Headache: Secondary | ICD-10-CM | POA: Insufficient documentation

## 2019-06-06 MED ORDER — ACETAMINOPHEN 325 MG PO TABS
650.0000 mg | ORAL_TABLET | Freq: Once | ORAL | Status: DC
  Filled 2019-06-06: qty 2
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
## 2019-06-25 ENCOUNTER — Encounter (INDEPENDENT_AMBULATORY_CARE_PROVIDER_SITE_OTHER): Payer: Self-pay | Admitting: Primary Care

## 2019-06-25 ENCOUNTER — Other Ambulatory Visit: Payer: Self-pay

## 2019-06-25 ENCOUNTER — Ambulatory Visit (INDEPENDENT_AMBULATORY_CARE_PROVIDER_SITE_OTHER): Payer: Self-pay | Admitting: Primary Care

## 2019-06-25 DIAGNOSIS — R634 Abnormal weight loss: Secondary | ICD-10-CM

## 2019-06-25 NOTE — Progress Notes (Signed)
Virtual Visit via Telephone Note  I connected with Regina [name] on 06/25/19 at  4:10 PM EDT by telephone and verified that I am speaking with the correct person using two identifiers.   I discussed the limitations, risks, security and privacy concerns of performing an evaluation and management service by telephone and the availability of in person appointments. I also discussed with the patient that there may be a patient responsible charge related to this service. The patient expressed understanding and agreed to proceed.   History of Present Illness: Regina [name] was having a tele visit because she is loosing weight and has lost her appetite. Asked patient how much weight she has lost in a month she replied 4 lbs. Not significant enough for concern at this time but discussed her diet. She stated she has lost her appetite since she had her girly part surgery.   Past Medical History:  Diagnosis Date  . Hypertension    Observations/Objective: Review of Systems  Constitutional: Positive for weight loss.       Decrease appetite   All other systems reviewed and are negative.   Assessment and Plan: Regina [name] was seen today for poor appetite.  Diagnoses and all orders for this visit:  Loss of weight Decrease appetite does not sound to be depressed but underlying cause of loss of appetite and loss of taste . In person visit check TSH. Advise to start drinking supplements ensure or boost not as a meal but supplement     Follow Up Instructions:    I discussed the assessment and treatment plan with the patient. The patient was provided an opportunity to ask questions and all were answered. The patient agreed with the plan and demonstrated an understanding of the instructions.   The patient was advised to call back or seek an in-person evaluation if the symptoms worsen or if the condition fails to improve as anticipated.  I provided 12 minutes of non-face-to-face time during this  encounter.   Kerin Perna, NP

## 2019-07-16 ENCOUNTER — Encounter (HOSPITAL_COMMUNITY): Payer: Self-pay

## 2019-07-16 ENCOUNTER — Other Ambulatory Visit: Payer: Self-pay

## 2019-07-16 ENCOUNTER — Emergency Department (HOSPITAL_COMMUNITY): Payer: Self-pay

## 2019-07-16 ENCOUNTER — Emergency Department (HOSPITAL_COMMUNITY)
Admission: EM | Admit: 2019-07-16 | Discharge: 2019-07-16 | Disposition: A | Discharge status: Home / Self Care | Payer: Self-pay | Attending: Emergency Medicine | Admitting: Emergency Medicine

## 2019-07-16 DIAGNOSIS — R202 Paresthesia of skin: Secondary | ICD-10-CM | POA: Insufficient documentation

## 2019-07-16 DIAGNOSIS — Z5321 Procedure and treatment not carried out due to patient leaving prior to being seen by health care provider: Secondary | ICD-10-CM | POA: Insufficient documentation

## 2019-07-16 LAB — DIFFERENTIAL
Abs Immature Granulocytes: 0.04 10*3/uL (ref 0.00–0.07)
Basophils Absolute: 0.1 10*3/uL (ref 0.0–0.1)
Basophils Relative: 1 %
Eosinophils Absolute: 0 10*3/uL (ref 0.0–0.5)
Eosinophils Relative: 0 %
Immature Granulocytes: 1 %
Lymphocytes Relative: 46 %
Lymphs Abs: 3.5 10*3/uL (ref 0.7–4.0)
Monocytes Absolute: 0.7 10*3/uL (ref 0.1–1.0)
Monocytes Relative: 10 %
Neutro Abs: 3.1 10*3/uL (ref 1.7–7.7)
Neutrophils Relative %: 42 %

## 2019-07-16 LAB — COMPREHENSIVE METABOLIC PANEL
ALT: 58 U/L — ABNORMAL HIGH (ref 0–44)
AST: 242 U/L — ABNORMAL HIGH (ref 15–41)
Albumin: 4 g/dL (ref 3.5–5.0)
Alkaline Phosphatase: 89 U/L (ref 38–126)
Anion gap: 16 — ABNORMAL HIGH (ref 5–15)
BUN: <5 mg/dL — ABNORMAL LOW (ref 6–20)
CO2: 16 mmol/L — ABNORMAL LOW (ref 22–32)
Calcium: 8.7 mg/dL — ABNORMAL LOW (ref 8.9–10.3)
Chloride: 96 mmol/L — ABNORMAL LOW (ref 98–111)
Creatinine, Ser: 0.65 mg/dL (ref 0.44–1.00)
GFR calc Af Amer: >60 mL/min (ref >60)
GFR calc non Af Amer: >60 mL/min (ref >60)
Glucose, Bld: 69 mg/dL — ABNORMAL LOW (ref 70–99)
Potassium: 4.2 mmol/L (ref 3.5–5.1)
Sodium: 128 mmol/L — ABNORMAL LOW (ref 135–145)
Total Bilirubin: 0.8 mg/dL (ref 0.3–1.2)
Total Protein: 7.7 g/dL (ref 6.5–8.1)

## 2019-07-16 LAB — I-STAT CHEM 8, ED
BUN: 4 mg/dL — ABNORMAL LOW (ref 6–20)
Calcium, Ion: 1.05 mmol/L — ABNORMAL LOW (ref 1.15–1.40)
Chloride: 96 mmol/L — ABNORMAL LOW (ref 98–111)
Creatinine, Ser: 1.2 mg/dL — ABNORMAL HIGH (ref 0.44–1.00)
Glucose, Bld: 65 mg/dL — ABNORMAL LOW (ref 70–99)
HCT: 38 % (ref 36.0–46.0)
Hemoglobin: 12.9 g/dL (ref 12.0–15.0)
Potassium: 4.2 mmol/L (ref 3.5–5.1)
Sodium: 129 mmol/L — ABNORMAL LOW (ref 135–145)
TCO2: 17 mmol/L — ABNORMAL LOW (ref 22–32)

## 2019-07-16 LAB — CBC
HCT: 33.4 % — ABNORMAL LOW (ref 36.0–46.0)
Hemoglobin: 11.7 g/dL — ABNORMAL LOW (ref 12.0–15.0)
MCH: 30.9 pg (ref 26.0–34.0)
MCHC: 35 g/dL (ref 30.0–36.0)
MCV: 88.1 fL (ref 80.0–100.0)
Platelets: 340 10*3/uL (ref 150–400)
RBC: 3.79 MIL/uL — ABNORMAL LOW (ref 3.87–5.11)
RDW: 13.7 % (ref 11.5–15.5)
WBC: 7.5 10*3/uL (ref 4.0–10.5)
nRBC: 0 % (ref 0.0–0.2)

## 2019-07-16 LAB — PROTIME-INR
INR: 1 (ref 0.8–1.2)
Prothrombin Time: 12.9 seconds (ref 11.4–15.2)

## 2019-07-16 LAB — APTT: aPTT: 30 seconds (ref 24–36)

## 2019-07-16 MED ORDER — SODIUM CHLORIDE 0.9% FLUSH: 3.0000 mL | Freq: Once | INTRAVENOUS | Status: DC

## 2019-07-16 NOTE — ED Triage Notes (Signed)
Patient reports that she awoke yesterday am with left leg numbness and tingling of left neck. On assessment alert and oriented, ambulatory with steady gait to triage. Speech clear and no drift

## 2019-07-16 NOTE — ED Notes (Signed)
Pt called for room no answer

## 2019-07-16 NOTE — ED Notes (Signed)
Called Pt X2 No answer.

## 2019-07-17 ENCOUNTER — Other Ambulatory Visit: Payer: Self-pay

## 2019-07-17 ENCOUNTER — Emergency Department (HOSPITAL_COMMUNITY)
Admission: EM | Admit: 2019-07-17 | Discharge: 2019-07-17 | Disposition: A | Discharge status: Home / Self Care | Payer: Self-pay | Attending: Emergency Medicine | Admitting: Emergency Medicine

## 2019-07-17 ENCOUNTER — Emergency Department (HOSPITAL_COMMUNITY): Payer: Self-pay

## 2019-07-17 ENCOUNTER — Telehealth (HOSPITAL_COMMUNITY): Payer: Self-pay

## 2019-07-17 ENCOUNTER — Encounter (HOSPITAL_COMMUNITY): Payer: Self-pay | Admitting: Emergency Medicine

## 2019-07-17 DIAGNOSIS — R200 Anesthesia of skin: Secondary | ICD-10-CM | POA: Insufficient documentation

## 2019-07-17 DIAGNOSIS — E871 Hypo-osmolality and hyponatremia: Secondary | ICD-10-CM | POA: Insufficient documentation

## 2019-07-17 DIAGNOSIS — Z79899 Other long term (current) drug therapy: Secondary | ICD-10-CM | POA: Insufficient documentation

## 2019-07-17 DIAGNOSIS — Z7982 Long term (current) use of aspirin: Secondary | ICD-10-CM | POA: Insufficient documentation

## 2019-07-17 DIAGNOSIS — I10 Essential (primary) hypertension: Secondary | ICD-10-CM | POA: Insufficient documentation

## 2019-07-17 LAB — COMPREHENSIVE METABOLIC PANEL
ALT: 46 U/L — ABNORMAL HIGH (ref 0–44)
AST: 135 U/L — ABNORMAL HIGH (ref 15–41)
Albumin: 3.8 g/dL (ref 3.5–5.0)
Alkaline Phosphatase: 94 U/L (ref 38–126)
Anion gap: 14 (ref 5–15)
BUN: 5 mg/dL — ABNORMAL LOW (ref 6–20)
CO2: 15 mmol/L — ABNORMAL LOW (ref 22–32)
Calcium: 8.7 mg/dL — ABNORMAL LOW (ref 8.9–10.3)
Chloride: 99 mmol/L (ref 98–111)
Creatinine, Ser: 0.55 mg/dL (ref 0.44–1.00)
GFR calc Af Amer: >60 mL/min (ref >60)
GFR calc non Af Amer: >60 mL/min (ref >60)
Glucose, Bld: 90 mg/dL (ref 70–99)
Potassium: 3.6 mmol/L (ref 3.5–5.1)
Sodium: 128 mmol/L — ABNORMAL LOW (ref 135–145)
Total Bilirubin: 0.6 mg/dL (ref 0.3–1.2)
Total Protein: 7.7 g/dL (ref 6.5–8.1)

## 2019-07-17 LAB — CBC WITH DIFFERENTIAL/PLATELET
Abs Immature Granulocytes: 0.02 10*3/uL (ref 0.00–0.07)
Basophils Absolute: 0.1 10*3/uL (ref 0.0–0.1)
Basophils Relative: 2 %
Eosinophils Absolute: 0.1 10*3/uL (ref 0.0–0.5)
Eosinophils Relative: 1 %
HCT: 31.4 % — ABNORMAL LOW (ref 36.0–46.0)
Hemoglobin: 11.3 g/dL — ABNORMAL LOW (ref 12.0–15.0)
Immature Granulocytes: 0 %
Lymphocytes Relative: 44 %
Lymphs Abs: 2.7 10*3/uL (ref 0.7–4.0)
MCH: 31.5 pg (ref 26.0–34.0)
MCHC: 36 g/dL (ref 30.0–36.0)
MCV: 87.5 fL (ref 80.0–100.0)
Monocytes Absolute: 0.6 10*3/uL (ref 0.1–1.0)
Monocytes Relative: 10 %
Neutro Abs: 2.6 10*3/uL (ref 1.7–7.7)
Neutrophils Relative %: 43 %
Platelets: 299 10*3/uL (ref 150–400)
RBC: 3.59 MIL/uL — ABNORMAL LOW (ref 3.87–5.11)
RDW: 13.7 % (ref 11.5–15.5)
WBC: 6 10*3/uL (ref 4.0–10.5)
nRBC: 0 % (ref 0.0–0.2)

## 2019-07-17 LAB — LIPASE, BLOOD: Lipase: 34 U/L (ref 11–51)

## 2019-07-17 LAB — ETHANOL: Alcohol, Ethyl (B): 231 mg/dL — ABNORMAL HIGH (ref <10)

## 2019-07-17 MED ORDER — SODIUM CHLORIDE 0.9 % IV BOLUS
1000.0000 mL | Freq: Once | INTRAVENOUS | Status: AC
  Administered 2019-07-17: 20:00:00 1000 mL via INTRAVENOUS

## 2019-07-17 NOTE — Discharge Instructions (Signed)
As discussed, today's evaluation was generally reassuring, there is not current evidence for stroke. However, there is some evidence for alcohol impairing your nutrition status. It is important that you take vitamins daily, including thiamine and folic acid, drink alcohol only in moderation, and monitor your condition carefully. Please follow-up with your physician or return here for concerning changes.

## 2019-07-17 NOTE — ED Triage Notes (Signed)
Patient reports left foot numbness and tingling up left leg and tingling in left neck to mouth x2 days. Seen at [facility] with CT scan complete yesterday. Ambulatory without difficulty.

## 2019-07-17 NOTE — ED Provider Notes (Signed)
[facility] Note   CSN: [id number] Arrival date & time: 07/17/19  1809     History   Chief Complaint Chief Complaint  Patient presents with  . Tingling    HPI Regina [name] is a 53 y.o. female.     HPI  Patient presents with concern of left lower leg numbness and left neck tingling. She notes that symptoms began more than 1 week ago, initially with left lower leg numbness, but no weakness, and with no gait abnormality. There is no clear alleviating or exacerbating factor. Over the past 3 days patient has also developed tingling in her left neck, which is not numbness, and she has no headache, no vision changes, no nausea, no vomiting, no weakness in her arms, no chest pain, no dyspnea. Yesterday, with concern for accommodation tingling and numbness she went to our affiliated facility. She left prior to being seen, and after CT scan was performed. She now presents with after mentioned concern.   Past Medical History:  Diagnosis Date  . Hypertension     Patient Active Problem List   Diagnosis Date Noted  . Hyponatremia 04/16/2018  . Depression 04/16/2018  . Hypertensive urgency   . HTN (hypertension) 01/13/2016  . Right anterior shoulder pain 07/18/2014  . Postconcussion syndrome 05/28/2014    Past Surgical History:  Procedure Laterality Date  . dental procedure       OB History   No obstetric history on file.      Home Medications    Prior to Admission medications   Medication Sig Start Date End Date Taking? Authorizing Provider  amLODipine (NORVASC) 10 MG tablet Take 1 tablet (10 mg total) by mouth daily. 06/05/19  Yes [name], NP  aspirin EC 81 MG tablet Take 81 mg by mouth daily.   Yes [provider]  Multiple Vitamins-Minerals (CENTRUM SILVER 50+WOMEN) TABS Take 1 tablet by mouth daily.   Yes [provider]  carvedilol (COREG) 6.25 MG tablet Take 1 tablet (6.25 mg total) by  mouth 2 (two) times daily. Patient not taking: Reported on 07/17/2019 04/16/18 06/05/19  Tyrone Nine, MD  lisinopril-hydrochlorothiazide (ZESTORETIC) 20-12.5 MG tablet Take 1 tablet by mouth daily. Patient not taking: Reported on 07/17/2019 04/16/18   Tyrone Nine, MD  metoprolol tartrate (LOPRESSOR) 25 MG tablet Take 1 tablet (25 mg total) by mouth 2 (two) times daily. Patient not taking: Reported on 07/17/2019 06/05/19   [name], NP    Family History Family History  Problem Relation Age of Onset  . Leukemia Mother   . Hypertension Mother   . Hypertension Father   . Cancer Neg Hx   . Diabetes Neg Hx   . Heart disease Neg Hx     Social History Social History   Tobacco Use  . Smoking status: Never Smoker  . Smokeless tobacco: Never Used  Substance Use Topics  . Alcohol use: Yes    Comment: social  . Drug use: No     Allergies   Bee venom   Review of Systems Review of Systems  Constitutional:       Per HPI, otherwise negative  HENT:       Per HPI, otherwise negative  Respiratory:       Per HPI, otherwise negative  Cardiovascular:       Per HPI, otherwise negative  Gastrointestinal: Negative for vomiting.  Endocrine:       Negative aside from HPI  Genitourinary:  Neg aside from HPI   Musculoskeletal:       Per HPI, otherwise negative  Skin: Negative.   Neurological: Positive for numbness. Negative for syncope, weakness, light-headedness and headaches.     Physical Exam Updated Vital Signs BP (!) 149/84 (BP Location: Right Arm)   Pulse (!) 105   Temp 98.3 F (36.8 C) (Oral)   Resp 17   LMP 06/06/2010   SpO2 97%   Physical Exam Vitals signs and nursing note reviewed.  Constitutional:      General: She is not in acute distress.    Appearance: She is well-developed.  HENT:     Head: Normocephalic and atraumatic.  Eyes:     Conjunctiva/sclera: Conjunctivae normal.  Cardiovascular:     Rate and Rhythm: Normal rate and regular  rhythm.  Pulmonary:     Effort: Pulmonary effort is normal. No respiratory distress.     Breath sounds: Normal breath sounds. No stridor.  Abdominal:     General: There is no distension.  Skin:    General: Skin is warm and dry.  Neurological:     General: No focal deficit present.     Mental Status: She is alert and oriented to person, place, and time.     Cranial Nerves: No cranial nerve deficit.     Motor: Atrophy present.     Coordination: Coordination normal.      ED Treatments / Results  Labs (all labs ordered are listed, but only abnormal results are displayed) Labs Reviewed  COMPREHENSIVE METABOLIC PANEL - Abnormal; Notable for the following components:      Result Value   Sodium 128 (*)    CO2 15 (*)    BUN 5 (*)    Calcium 8.7 (*)    AST 135 (*)    ALT 46 (*)    All other components within normal limits  ETHANOL - Abnormal; Notable for the following components:   Alcohol, Ethyl (B) 231 (*)    All other components within normal limits  CBC WITH DIFFERENTIAL/PLATELET - Abnormal; Notable for the following components:   RBC 3.59 (*)    Hemoglobin 11.3 (*)    HCT 31.4 (*)    All other components within normal limits  LIPASE, BLOOD    EKG None  Radiology Ct Head Wo Contrast  Result Date: 07/16/2019 CLINICAL DATA:  Left leg numbness and tingling of the left neck since yesterday morning. EXAM: CT HEAD WITHOUT CONTRAST TECHNIQUE: Contiguous axial images were obtained from the base of the skull through the vertex without intravenous contrast. COMPARISON:  05/17/2014 FINDINGS: Brain: Interval mildly enlarged ventricles and cortical sulci. Interval moderate patchy white matter low density in both cerebral hemispheres. No intracranial hemorrhage, mass lesion or CT evidence of acute infarction. Vascular: No hyperdense vessel or unexpected calcification. Skull: Normal. Negative for fracture or focal lesion. Sinuses/Orbits: Unremarkable. Other: None. IMPRESSION: 1. No acute  abnormality. 2. Interval mild diffuse cerebral and cerebellar atrophy. 3. Interval moderate chronic small vessel white matter ischemic changes in both cerebral hemispheres. Electronically Signed   By: Beckie SaltsSteven  Reid M.D.   On: 07/16/2019 17:30   Dg Chest Port 1 View  Result Date: 07/17/2019 CLINICAL DATA:  Tingling. Left foot numbness and tingling. EXAM: PORTABLE CHEST 1 VIEW COMPARISON:  Radiograph 05/02/2019. radiograph 04/15/2018 FINDINGS: Mild hyperinflation. Fine interstitial opacities at the lung bases may represent fibrosis. There is biapical and upper lobe predominant pleuroparenchymal scarring. No acute airspace disease. Normal heart size and mediastinal contours. No  pulmonary edema. No pleural fluid or pneumothorax. No acute osseous abnormalities. IMPRESSION: 1. Chronic lung disease. Fine reticular opacities at the lung bases are unchanged is suggest interstitial lung disease, possible pulmonary fibrosis. Biapical and upper lobe pleuroparenchymal scarring. 2. No superimposed acute abnormality. Electronically Signed   By: Keith Rake M.D.   On: 07/17/2019 19:47    Procedures Procedures (including critical care time)  Medications Ordered in ED Medications  sodium chloride 0.9 % bolus 1,000 mL (0 mLs Intravenous Stopped 07/17/19 2103)     Initial Impression / Assessment and Plan / ED Course  I have reviewed the triage vital signs and the nursing notes.  Pertinent labs & imaging results that were available during my care of the patient were reviewed by me and considered in my medical decision making (see chart for details).        9:40 PM Patient awake, alert, watching television. Heart rate has improved, pressure unremarkable. Labs abnormal are consistent with multiple prior studies, with persistent hyponatremia most notable abnormality.  Line patient was found to have critically abnormal alcohol value, though that was several hours ago, and she is now clinically awake, alert,  not intoxicated. 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  Absent evidence for acute new pathology, with consideration of alcohol intoxication/agitation, as above, the patient was discharged in stable condition.  Final Clinical Impressions(s) / ED Diagnoses   Final diagnoses:  Numbness  Hyponatremia    ED Discharge Orders    None       Carmin Muskrat, MD 07/17/19 2142

## 2019-07-17 NOTE — Telephone Encounter (Addendum)
Pt. Called for her CT scan results from her Ed visit last night.  She did leave before being seen by an EDP.  I explained to her that I am unable to discuss CT scan results.  I did discuss with pt. If she continues to have symptoms of neck pain, intermittent facial numbness that she should return to the ED for a medical evaluation. Discussed s/s of stroke and to call 911 immediately.  Pt. Verbalized understanding and all questions answered.

## 2019-07-17 NOTE — ED Notes (Signed)
Pt was verbalized discharge instructions. Pt had no further questions at this time. NAD. 

## 2019-07-31 ENCOUNTER — Other Ambulatory Visit: Payer: Self-pay

## 2019-07-31 DIAGNOSIS — Z20822 Contact with and (suspected) exposure to covid-19: Secondary | ICD-10-CM

## 2019-08-02 LAB — NOVEL CORONAVIRUS, NAA: SARS-CoV-2, NAA: NOT DETECTED

## 2019-08-21 ENCOUNTER — Telehealth (INDEPENDENT_AMBULATORY_CARE_PROVIDER_SITE_OTHER): Payer: Self-pay

## 2019-08-23 NOTE — Telephone Encounter (Signed)
Open error 

## 2019-09-10 ENCOUNTER — Encounter (INDEPENDENT_AMBULATORY_CARE_PROVIDER_SITE_OTHER): Payer: Self-pay | Admitting: Primary Care

## 2019-09-10 ENCOUNTER — Telehealth (INDEPENDENT_AMBULATORY_CARE_PROVIDER_SITE_OTHER): Payer: Self-pay

## 2019-09-10 ENCOUNTER — Ambulatory Visit (INDEPENDENT_AMBULATORY_CARE_PROVIDER_SITE_OTHER): Payer: Self-pay | Admitting: Primary Care

## 2019-09-10 ENCOUNTER — Other Ambulatory Visit: Payer: Self-pay

## 2019-09-10 DIAGNOSIS — R531 Weakness: Secondary | ICD-10-CM

## 2019-09-10 DIAGNOSIS — F1010 Alcohol abuse, uncomplicated: Secondary | ICD-10-CM

## 2019-09-10 DIAGNOSIS — R29898 Other symptoms and signs involving the musculoskeletal system: Secondary | ICD-10-CM

## 2019-09-10 DIAGNOSIS — I10 Essential (primary) hypertension: Secondary | ICD-10-CM

## 2019-09-10 NOTE — Telephone Encounter (Signed)
Patient called to inform PCP that she can enter detox through [facility] Emergency [facility]. She is considering to enter treatment on or around January 10th. Maryjean Morn, CMA

## 2019-09-10 NOTE — Progress Notes (Signed)
BP today 131/70 left arm  Numbness in left leg since after christmas

## 2019-09-10 NOTE — Progress Notes (Signed)
Virtual Visit via Telephone Note  I connected with Regina [name] on 09/10/19 at 10:10 AM EST by telephone and verified that I am speaking with the correct person using two identifiers.   I discussed the limitations, risks, security and privacy concerns of performing an evaluation and management service by telephone and the availability of in person appointments. I also discussed with the patient that there may be a patient responsible charge related to this service. The patient expressed understanding and agreed to proceed.   History of Present Illness: Ms. Regina [name] is having a tele visit for evaluation of blood pressure ranges from systolic 130-145 and diastolic 70-80. She was seen in the ED for weakness and tingling in her legs. We had a conversation regarding what she needed to do and would have CSW to call her.    Past Medical History:  Diagnosis Date  . Hypertension    Current Outpatient Medications on File Prior to Visit  Medication Sig Dispense Refill  . amLODipine (NORVASC) 10 MG tablet Take 1 tablet (10 mg total) by mouth daily. 30 tablet 3  . aspirin EC 81 MG tablet Take 81 mg by mouth daily.    . Multiple Vitamins-Minerals (CENTRUM SILVER 50+WOMEN) TABS Take 1 tablet by mouth daily.    . carvedilol (COREG) 6.25 MG tablet Take 1 tablet (6.25 mg total) by mouth 2 (two) times daily. (Patient not taking: Reported on 07/17/2019) 60 tablet 0   No current facility-administered medications on file prior to visit.    Observations/Objective: Review of Systems  Musculoskeletal: Positive for falls.  Neurological: Positive for weakness.  All other systems reviewed and are negative.   Assessment and Plan: Regina [name] was seen today for follow-up and numbness.  Diagnoses and all orders for this visit:  Essential hypertension Blood pressure remains variable systolic 130-145 and diastolic 70-80 (controlled) discussed goal for controlled blood pressure 130/80, monitoring salt intake and  excise was recommended 30 mins daily or 150mg  weekly .Adherence to taking medication daily.  Alcohol abuse Discussed why she has falls acknowledge she is tired and needs help. Car issues and she is not able to go to facility in [facility]. Contacted CSW to assist with placement   Weakness of both lower extremities ED notes indicate alcohol malnourished , labs indicate low calcium underlining cause may contribute to alcohol abuse . Encourage multivitamin with calcium and vitamin D.    Follow Up Instructions:    I discussed the assessment and treatment plan with the patient. The patient was provided an opportunity to ask questions and all were answered. The patient agreed with the plan and demonstrated an understanding of the instructions.   The patient was advised to call back or seek an in-person evaluation if the symptoms worsen or if the condition fails to improve as anticipated.  I provided 15 minutes of non-face-to-face time during this encounter.   [facility], NP

## 2019-10-12 ENCOUNTER — Telehealth: Payer: Self-pay | Admitting: Licensed Clinical Social Worker

## 2019-10-12 NOTE — Telephone Encounter (Signed)
Call placed to patient regarding [facility] referral. LCSW was unable to leave a message due to voice mail being full.

## 2019-10-25 MED FILL — ?AMLODIPINE BESYL 10MG TABL: 10 | 30 days supply | Qty: 30 | Fill #1

## 2019-11-13 ENCOUNTER — Ambulatory Visit (INDEPENDENT_AMBULATORY_CARE_PROVIDER_SITE_OTHER): Payer: Self-pay | Admitting: Licensed Clinical Social Worker

## 2019-11-13 ENCOUNTER — Other Ambulatory Visit: Payer: Self-pay

## 2019-11-13 DIAGNOSIS — F1010 Alcohol abuse, uncomplicated: Secondary | ICD-10-CM

## 2019-11-16 NOTE — BH Specialist Note (Signed)
Integrated Behavioral Health Visit via Telemedicine (Telephone)  11/16/2019 Wynona Canes [phone]   Session Start time: 10:20 AM  Session End time: 10:40 AM Total time: 20  Referring Provider: NP Randa Evens Type of Visit: Telephonic Patient location: Home [facility] Provider location: Office All persons participating in visit: LCSW and patient  Confirmed patient's address: Yes  Confirmed patient's phone number: Yes  Any changes to demographics: No   Confirmed patient's insurance: Yes  Any changes to patient's insurance: No   Discussed confidentiality: Yes    The following statements were read to the patient and/or legal guardian that are established with the [facility] Provider.  "The purpose of this phone visit is to provide behavioral health care while limiting exposure to the coronavirus (COVID19).  There is a possibility of technology failure and discussed alternative modes of communication if that failure occurs."  "By engaging in this telephone visit, you consent to the provision of healthcare.  Additionally, you authorize for your insurance to be billed for the services provided during this telephone visit."   Patient and/or legal guardian consented to telephone visit: Yes   PRESENTING CONCERNS: Patient and/or family reports the following symptoms/concerns: Pt reports ongoing substance use and is interested in treatment.  Duration of problem: Ongoing; Severity of problem: severe  STRENGTHS (Protective Factors/Coping Skills): Pt has desire to change  GOALS ADDRESSED: Patient will: 1.  Reduce symptoms of: substance use  2.  Increase knowledge and/or ability of: community resources  3.  Demonstrate ability to: Decrease self-medicating behaviors  INTERVENTIONS: Interventions utilized:  Solution-Focused Strategies, Supportive Counseling, Psychoeducation and/or Health Education and Link to [employer] Standardized Assessments completed: Not  Needed  ASSESSMENT: Patient currently experiencing stress triggered by ongoing substance use. Pt was successful in identifying how alcohol use has negatively impacted health and is interested in participating in treatment.   Patient may benefit from linkage to community resources. Validation and support provided. 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 Information to [facility] was provided.   PLAN: 1. Follow up with behavioral health clinician on : Contact LCSW with behavioral health and/or resource needs 2. Behavioral recommendations: Utilize information provided to participate in detox and/or inpatient/outpatient treatment 3. Referral(s): Integrated [facility] (In Clinic) and Substance Abuse Program  Bridgett Larsson, [place] 11/16/19 9:41 AM

## 2019-12-17 MED FILL — AMLODIPINE BESYLATE 10 MG T: 10 | 30 days supply | Qty: 30 | Fill #2

## 2020-01-05 ENCOUNTER — Emergency Department (HOSPITAL_COMMUNITY)
Admission: EM | Admit: 2020-01-05 | Discharge: 2020-01-05 | Disposition: A | Discharge status: Home / Self Care | Payer: Self-pay | Attending: Emergency Medicine | Admitting: Emergency Medicine

## 2020-01-05 ENCOUNTER — Encounter (HOSPITAL_COMMUNITY): Payer: Self-pay | Admitting: Emergency Medicine

## 2020-01-05 ENCOUNTER — Emergency Department (HOSPITAL_COMMUNITY): Payer: Self-pay

## 2020-01-05 ENCOUNTER — Other Ambulatory Visit: Payer: Self-pay

## 2020-01-05 DIAGNOSIS — R05 Cough: Secondary | ICD-10-CM | POA: Insufficient documentation

## 2020-01-05 DIAGNOSIS — R079 Chest pain, unspecified: Secondary | ICD-10-CM | POA: Insufficient documentation

## 2020-01-05 DIAGNOSIS — R0602 Shortness of breath: Secondary | ICD-10-CM | POA: Insufficient documentation

## 2020-01-05 DIAGNOSIS — I10 Essential (primary) hypertension: Secondary | ICD-10-CM | POA: Insufficient documentation

## 2020-01-05 DIAGNOSIS — Z7982 Long term (current) use of aspirin: Secondary | ICD-10-CM | POA: Insufficient documentation

## 2020-01-05 DIAGNOSIS — Z20822 Contact with and (suspected) exposure to covid-19: Secondary | ICD-10-CM | POA: Insufficient documentation

## 2020-01-05 DIAGNOSIS — Z79899 Other long term (current) drug therapy: Secondary | ICD-10-CM | POA: Insufficient documentation

## 2020-01-05 LAB — HEPATIC FUNCTION PANEL
ALT: 50 U/L — ABNORMAL HIGH (ref 0–44)
AST: 107 U/L — ABNORMAL HIGH (ref 15–41)
Albumin: 4.4 g/dL (ref 3.5–5.0)
Alkaline Phosphatase: 111 U/L (ref 38–126)
Bilirubin, Direct: 0.2 mg/dL (ref 0.0–0.2)
Indirect Bilirubin: 0.6 mg/dL (ref 0.3–0.9)
Total Bilirubin: 0.8 mg/dL (ref 0.3–1.2)
Total Protein: 8.3 g/dL — ABNORMAL HIGH (ref 6.5–8.1)

## 2020-01-05 LAB — CBC WITH DIFFERENTIAL/PLATELET
Abs Immature Granulocytes: 0.04 10*3/uL (ref 0.00–0.07)
Basophils Absolute: 0.1 10*3/uL (ref 0.0–0.1)
Basophils Relative: 1 %
Eosinophils Absolute: 0.1 10*3/uL (ref 0.0–0.5)
Eosinophils Relative: 1 %
HCT: 37.7 % (ref 36.0–46.0)
Hemoglobin: 13.1 g/dL (ref 12.0–15.0)
Immature Granulocytes: 1 %
Lymphocytes Relative: 34 %
Lymphs Abs: 2.5 10*3/uL (ref 0.7–4.0)
MCH: 29.8 pg (ref 26.0–34.0)
MCHC: 34.7 g/dL (ref 30.0–36.0)
MCV: 85.9 fL (ref 80.0–100.0)
Monocytes Absolute: 0.8 10*3/uL (ref 0.1–1.0)
Monocytes Relative: 11 %
Neutro Abs: 3.8 10*3/uL (ref 1.7–7.7)
Neutrophils Relative %: 52 %
Platelets: 391 10*3/uL (ref 150–400)
RBC: 4.39 MIL/uL (ref 3.87–5.11)
RDW: 14.4 % (ref 11.5–15.5)
WBC: 7.2 10*3/uL (ref 4.0–10.5)
nRBC: 0 % (ref 0.0–0.2)

## 2020-01-05 LAB — LIPASE, BLOOD: Lipase: 23 U/L (ref 11–51)

## 2020-01-05 LAB — BASIC METABOLIC PANEL
Anion gap: 12 (ref 5–15)
BUN: 6 mg/dL (ref 6–20)
CO2: 24 mmol/L (ref 22–32)
Calcium: 9.8 mg/dL (ref 8.9–10.3)
Chloride: 92 mmol/L — ABNORMAL LOW (ref 98–111)
Creatinine, Ser: 0.55 mg/dL (ref 0.44–1.00)
GFR calc Af Amer: >60 mL/min (ref >60)
GFR calc non Af Amer: >60 mL/min (ref >60)
Glucose, Bld: 78 mg/dL (ref 70–99)
Potassium: 3.8 mmol/L (ref 3.5–5.1)
Sodium: 128 mmol/L — ABNORMAL LOW (ref 135–145)

## 2020-01-05 LAB — D-DIMER, QUANTITATIVE: D-Dimer, Quant: <0.27 ug/mL-FEU (ref 0.00–0.50)

## 2020-01-05 LAB — TROPONIN I (HIGH SENSITIVITY): Troponin I (High Sensitivity): 4 ng/L (ref <18)

## 2020-01-05 MED ORDER — SODIUM CHLORIDE 0.9 % IV BOLUS
1000.0000 mL | Freq: Once | INTRAVENOUS | Status: AC
  Administered 2020-01-05: 1000 mL via INTRAVENOUS

## 2020-01-05 NOTE — ED Notes (Signed)
Pt sitting up in bed awake. Pt denies any needs. NAD noted. Will continue to monitor.

## 2020-01-05 NOTE — ED Notes (Signed)
Pt up walking around the room. COVID swab performed. Pt ready for d/c

## 2020-01-05 NOTE — ED Triage Notes (Signed)
Pt reports that for 3 days had SOB and back pains. Has allergies so been taking Mucinex and Benadryl. States that knew her BP was up due to seeing black spots. Pt denies pain now.

## 2020-01-05 NOTE — ED Provider Notes (Signed)
[facility]-EMERGENCY DEPT Provider Note   CSN: [id number] Arrival date & time: 01/05/20  1618     History Chief Complaint  Patient presents with  . Shortness of Breath    Regina [name] is a 54 y.o. female.  The history is provided by the patient.  Shortness of Breath Severity:  Mild Onset quality:  Gradual Duration:  3 days Timing:  Constant Progression:  Unchanged Chronicity:  New Context: URI   Relieved by:  Nothing Worsened by:  Nothing Associated symptoms: chest pain and cough   Associated symptoms: no abdominal pain, no claudication, no diaphoresis, no ear pain, no fever, no headaches, no hemoptysis, no neck pain, no PND, no rash, no sore throat, no sputum production, no syncope, no swollen glands, no vomiting and no wheezing   Risk factors: no hx of PE/DVT and no tobacco use    Quit smoking a week ago.    Past Medical History:  Diagnosis Date  . Hypertension     Patient Active Problem List   Diagnosis Date Noted  . Hyponatremia 04/16/2018  . Depression 04/16/2018  . Hypertensive urgency   . HTN (hypertension) 01/13/2016  . Right anterior shoulder pain 07/18/2014  . Postconcussion syndrome 05/28/2014    Past Surgical History:  Procedure Laterality Date  . dental procedure       OB History   No obstetric history on file.     Family History  Problem Relation Age of Onset  . Leukemia Mother   . Hypertension Mother   . Hypertension Father   . Cancer Neg Hx   . Diabetes Neg Hx   . Heart disease Neg Hx     Social History   Tobacco Use  . Smoking status: Never Smoker  . Smokeless tobacco: Never Used  Substance Use Topics  . Alcohol use: Yes    Comment: social  . Drug use: No    Home Medications Prior to Admission medications   Medication Sig Start Date End Date Taking? Authorizing Provider  amLODipine (NORVASC) 10 MG tablet Take 1 tablet (10 mg total) by mouth daily. 06/05/19  Yes [name], NP  aspirin EC  81 MG tablet Take 81 mg by mouth daily.   Yes [provider]  MAGNESIUM PO Take 1 tablet by mouth daily.   Yes [provider]  Multiple Vitamins-Minerals (CENTRUM SILVER 50+WOMEN) TABS Take 1 tablet by mouth daily.   Yes [provider]  carvedilol (COREG) 6.25 MG tablet Take 1 tablet (6.25 mg total) by mouth 2 (two) times daily. Patient not taking: Reported on 07/17/2019 04/16/18 06/05/19  Tyrone Nine, MD    Allergies    Bee venom  Review of Systems   Review of Systems  Constitutional: Negative for chills, diaphoresis and fever.  HENT: Negative for ear pain and sore throat.   Eyes: Negative for pain and visual disturbance.  Respiratory: Positive for cough and shortness of breath. Negative for hemoptysis, sputum production and wheezing.   Cardiovascular: Positive for chest pain. Negative for palpitations, claudication, syncope and PND.  Gastrointestinal: Negative for abdominal pain and vomiting.  Genitourinary: Negative for dysuria and hematuria.  Musculoskeletal: Negative for arthralgias, back pain and neck pain.  Skin: Negative for color change and rash.  Neurological: Negative for seizures, syncope and headaches.  All other systems reviewed and are negative.   Physical Exam Updated Vital Signs  ED Triage Vitals  Enc Vitals Group     BP 01/05/20 1634 (!) 166/97  Pulse Rate 01/05/20 1634 (!) 110     Resp 01/05/20 1634 18     Temp 01/05/20 1634 98.8 F (37.1 C)     Temp Source 01/05/20 1634 Oral     SpO2 01/05/20 1634 98 %     Weight --      Height --      Head Circumference --      Peak Flow --      Pain Score 01/05/20 1629 0     Pain Loc --      Pain Edu? --      Excl. in [place]? --     Physical Exam Vitals and nursing note reviewed.  Constitutional:      General: She is not in acute distress.    Appearance: She is well-developed. She is not ill-appearing.  HENT:     Head: Normocephalic and atraumatic.     Mouth/Throat:     Mouth:  Mucous membranes are moist.  Eyes:     Conjunctiva/sclera: Conjunctivae normal.     Pupils: Pupils are equal, round, and reactive to light.  Cardiovascular:     Rate and Rhythm: Normal rate and regular rhythm.     Pulses: Normal pulses.     Heart sounds: Normal heart sounds. No murmur.  Pulmonary:     Effort: Pulmonary effort is normal. No respiratory distress.     Breath sounds: Normal breath sounds. No decreased breath sounds, wheezing or rhonchi.     Comments: Coarse breath sounds  Abdominal:     Palpations: Abdomen is soft.     Tenderness: There is no abdominal tenderness.  Musculoskeletal:        General: Normal range of motion.     Cervical back: Neck supple.     Right lower leg: No edema.     Left lower leg: No edema.  Skin:    General: Skin is warm and dry.     Capillary Refill: Capillary refill takes less than 2 seconds.  Neurological:     General: No focal deficit present.     Mental Status: She is alert.     ED Results / Procedures / Treatments   Labs (all labs ordered are listed, but only abnormal results are displayed) Labs Reviewed  BASIC METABOLIC PANEL - Abnormal; Notable for the following components:      Result Value   Sodium 128 (*)    Chloride 92 (*)    All other components within normal limits  HEPATIC FUNCTION PANEL - Abnormal; Notable for the following components:   Total Protein 8.3 (*)    AST 107 (*)    ALT 50 (*)    All other components within normal limits  SARS CORONAVIRUS 2 (TAT 6-24 HRS)  CBC WITH DIFFERENTIAL/PLATELET  D-DIMER, QUANTITATIVE (NOT AT [facility])  LIPASE, BLOOD  TROPONIN I (HIGH SENSITIVITY)    EKG EKG Interpretation  Date/Time:  Saturday Jan 05 2020 16:31:36 EDT Ventricular Rate:  106 PR Interval:    QRS Duration: 90 QT Interval:  325 QTC Calculation: 432 R Axis:   73 Text Interpretation: Sinus tachycardia Atrial premature complex Confirmed by Lennice Sites [phone]) on 01/05/2020 7:14:46 PM   Radiology DG Chest 2  View  Result Date: 01/05/2020 CLINICAL DATA:  Pt reports that for 3 days had SOB and intermittent central chest pain. Nonproductive cough. Denies fever. H/o HTN. Nonsmoker. EXAM: CHEST - 2 VIEW COMPARISON:  Chest radiograph 07/17/2019 FINDINGS: The heart size and mediastinal contours are within normal  limits. Hyperinflated lungs. Bullous disease in the bilateral lung apices. Basilar predominant fine reticular opacities similar to prior consistent with chronic lung disease. No new focal consolidation. No pneumothorax or pleural effusion. No acute finding in the visualized skeleton. The visualized skeletal structures are unremarkable. IMPRESSION: Chronic interstitial lung disease and emphysema.  No acute finding. Electronically Signed   By: Emmaline Kluver M.D.   On: 01/05/2020 17:08    Procedures Procedures (including critical care time)  Medications Ordered in ED Medications  sodium chloride 0.9 % bolus 1,000 mL (1,000 mLs Intravenous New Bag/Given (Non-Interop) 01/05/20 1930)    ED Course  I have reviewed the triage vital signs and the nursing notes.  Pertinent labs & imaging results that were available during my care of the patient were reviewed by me and considered in my medical decision making (see chart for details).    MDM Rules/Calculators/A&P                      Marleta Lapierre is a 54 year old female with history of hypertension who presents to the ED with shortness of breath, chest pain.  Patient with mild tachycardia and hypertension upon arrival.  No fever.  Patient with symptoms for the last several days.  No active chest pain right now.  Has just not felt well.  Chest x-ray has already been done that shows chronic interstitial lung disease and emphysema but no other acute findings.  Patient is not aware of COPD.  She is a smoker.  No obvious exposures.  She states that her mom had lung disease.  Overall she has coarse breath sounds on exam.  No signs of volume overload on exam.   Given tachycardia will get D-dimer to evaluate for PE but otherwise has no risk factors.  Patient with history of hypertension but otherwise no cardiac risk factors.  Low concern for ACS but will check one troponin.  EKG shows sinus rhythm.  No ischemic changes.  Otherwise we will check basic labs.  D-dimer normal.  Doubt PE.  Sodium at baseline.  Electrolytes overall unremarkable.  No leukocytosis.  Troponin normal.  Overall likely viral process.  Will test for coronavirus.  Discharged in good condition.  No concern for ACS, dissection, PE.  This chart was dictated using voice recognition software.  Despite best efforts to proofread,  errors can occur which can change the documentation meaning.    Final Clinical Impression(s) / ED Diagnoses Final diagnoses:  Shortness of breath    Rx / DC Orders ED Discharge Orders    None       Virgina Norfolk, DO 01/05/20 2106

## 2020-01-06 LAB — SARS CORONAVIRUS 2 (TAT 6-24 HRS): SARS Coronavirus 2: NEGATIVE

## 2020-01-18 ENCOUNTER — Telehealth (INDEPENDENT_AMBULATORY_CARE_PROVIDER_SITE_OTHER): Payer: Self-pay | Admitting: Primary Care

## 2020-01-18 ENCOUNTER — Other Ambulatory Visit (INDEPENDENT_AMBULATORY_CARE_PROVIDER_SITE_OTHER): Payer: Self-pay | Admitting: Primary Care

## 2020-01-18 ENCOUNTER — Other Ambulatory Visit: Payer: Self-pay

## 2020-01-18 DIAGNOSIS — Z09 Encounter for follow-up examination after completed treatment for conditions other than malignant neoplasm: Secondary | ICD-10-CM

## 2020-01-18 DIAGNOSIS — R059 Cough, unspecified: Secondary | ICD-10-CM

## 2020-01-18 DIAGNOSIS — R05 Cough: Secondary | ICD-10-CM

## 2020-01-18 DIAGNOSIS — I10 Essential (primary) hypertension: Secondary | ICD-10-CM

## 2020-01-18 DIAGNOSIS — Z720 Tobacco use: Secondary | ICD-10-CM

## 2020-01-18 MED ORDER — CARVEDILOL 3.125 MG PO TABS: 3.1250 mg | ORAL_TABLET | Freq: Two times a day (BID) | ORAL | 3 refills | Status: DC

## 2020-01-18 MED ORDER — AMLODIPINE BESYLATE 10 MG PO TABS: 10.0000 mg | ORAL_TABLET | Freq: Every day | ORAL | 1 refills | Status: DC

## 2020-01-18 MED ORDER — NICOTINE 21 MG/24HR TD PT24
21.0000 mg | MEDICATED_PATCH | Freq: Every day | TRANSDERMAL | 0 refills | Status: DC
Start: 2020-01-18 — End: 2020-12-24

## 2020-01-18 MED ORDER — NICOTINE 14 MG/24HR TD PT24
14.0000 mg | MEDICATED_PATCH | Freq: Every day | TRANSDERMAL | 0 refills | Status: DC
Start: 2020-01-18 — End: 2020-12-24

## 2020-01-18 MED ORDER — NICOTINE 7 MG/24HR TD PT24
7.0000 mg | MEDICATED_PATCH | Freq: Every day | TRANSDERMAL | 0 refills | Status: DC
Start: 2020-01-18 — End: 2020-12-24

## 2020-01-18 MED ORDER — ALBUTEROL SULFATE HFA 108 (90 BASE) MCG/ACT IN AERS
2.0000 | INHALATION_SPRAY | Freq: Four times a day (QID) | RESPIRATORY_TRACT | 1 refills | Status: DC | PRN
Start: 2020-01-18 — End: 2020-01-18

## 2020-01-18 NOTE — Progress Notes (Signed)
SOB for about 2 wks now. Per pt she went to the hospital for this. Per pt when she breath really hard the top part of her back hurts.  Per pt she would like the patches for smoking.   Per pt she never got her Coreg

## 2020-01-18 NOTE — Progress Notes (Signed)
Virtual Visit via Telephone Note  I connected with Regina [name] on 01/18/20 at  8:30 AM EDT by telephone and verified that I am speaking with the correct person using two identifiers.   I discussed the limitations, risks, security and privacy concerns of performing an evaluation and management service by telephone and the availability of in person appointments. I also discussed with the patient that there may be a patient responsible charge related to this service. The patient expressed understanding and agreed to proceed.   History of Present Illness: Regina [name] is a 54 year old female she is having a telemetry visit for hypertension follow-up and recent emergency room discharge.  She presented to the emergency room on Jan 05, 2020 with complaints of shortness of breath gradual onset duration 3 days.  Other associating factors were cough and chest pain.  Patient is also interested in getting the patches for smoking cessation Note pt she never got her Coreg  Past Medical History:  Diagnosis Date  . Hypertension     Current Outpatient Medications on File Prior to Visit  Medication Sig Dispense Refill  . aspirin EC 81 MG tablet Take 81 mg by mouth daily.    Marland Kitchen MAGNESIUM PO Take 1 tablet by mouth daily.    . Multiple Vitamins-Minerals (CENTRUM SILVER 50+WOMEN) TABS Take 1 tablet by mouth daily.     No current facility-administered medications on file prior to visit.   Observations/Objective: Review of Systems  Respiratory: Positive for cough and shortness of breath.   Cardiovascular: Positive for chest pain.  All other systems reviewed and are negative.  Assessment and Plan: Diagnoses and all orders for this visit:  Tobacco abuse She is aware of increased risk for lung cancer and other respiratory diseases recommend cessation.  This may be underling cause of cough, chest pain and shortness of breath  nicotine (NICODERM CQ) 21 mg/24hr patch; Place 1 patch (21 mg total) onto the  skin daily. -     nicotine (NICODERM CQ) 14 mg/24hr patch; Place 1 patch (14 mg total) onto the skin daily. -     nicotine (NICODERM CQ) 7 mg/24hr patch; Place 1 patch (7 mg total) onto the skin daily.  Essential hypertension Continue all antihypertensives as prescribed.  Remember to bring in your blood pressure log with you for your follow up appointment.  Explained to patient she needs to be taking Coreg for rate control of her heart and feeling of palpitations.  Heart rate in the ED was 110. and blood pressure 166/97 DASH/Mediterranean Diets are healthier choices for HTN.  -     amLODipine (NORVASC) 10 MG tablet; Take 1 tablet (10 mg total) by mouth daily.  Hospital discharge follow-up Chest x-ray has already been done that shows chronic interstitial lung disease and emphysema but no other acute findings. EKG shows sinus rhythm.  No ischemic changes. D-dimer normal and Troponin norma  Cough Secondary to COPD and smoking.  Will add a short acting beta agonist for bronchospasms.  May take over-the-counter cough medication or Mucinex for treatment of cough.  Other orders -     albuterol (VENTOLIN HFA) 108 (90 Base) MCG/ACT inhaler; Inhale 2 puffs into the lungs every 6 (six) hours as needed for wheezing or shortness of breath. -    -     carvedilol (COREG) 3.125 MG tablet; Take 1 tablet (3.125 mg total) by mouth 2 (two) times daily with a meal.    Follow Up Instructions:    I discussed the  assessment and treatment plan with the patient. The patient was provided an opportunity to ask questions and all were answered. The patient agreed with the plan and demonstrated an understanding of the instructions.   The patient was advised to call back or seek an in-person evaluation if the symptoms worsen or if the condition fails to improve as anticipated.  I provided 16 minutes of non-face-to-face time during this encounter.  This includes reading ED report, labs, and imaging   [name], NP

## 2020-01-24 IMAGING — CT CT HEAD W/O CM
4 series · 16 of 47 positions shown, 18 images · non-contrast
Comparison: 05/17/2014

CLINICAL DATA: Left leg numbness and tingling of the left neck
since yesterday morning.

EXAM:
CT HEAD WITHOUT CONTRAST
TECHNIQUE: Contiguous axial images were obtained from the base of the skull
through the vertex without intravenous contrast.

[Series 3: head without · axial · non-contrast · 0.39mm/px · z∈[+1447,+1557]mm · 7 of 30 slices shown, 9 images]
[im 4/30  brain]
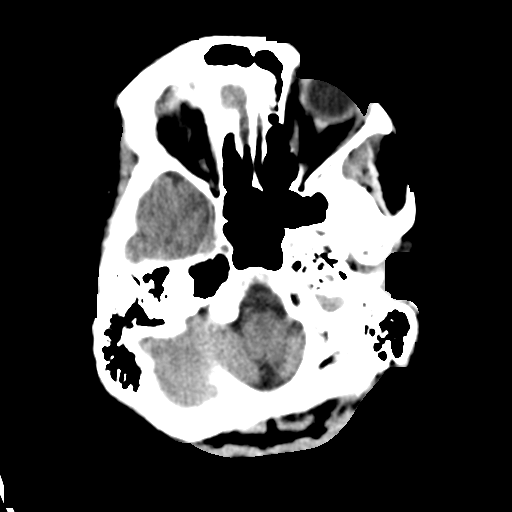
[im 4/30  bone]
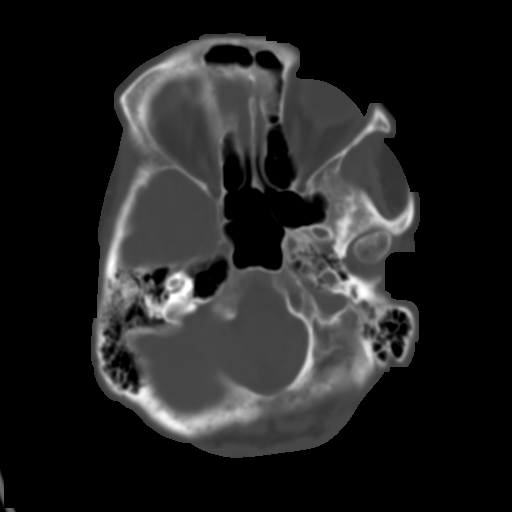
[im 8/30  brain]
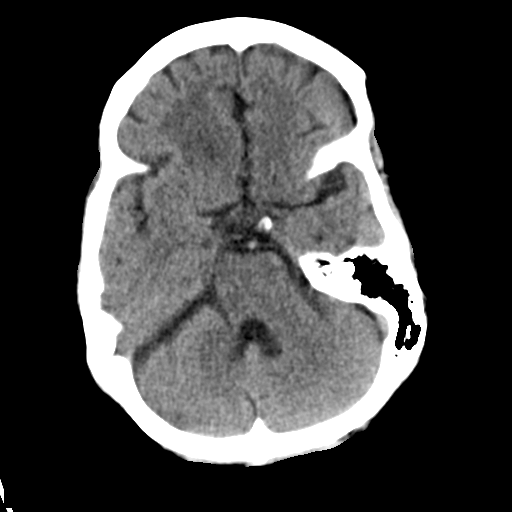
[im 11/30  brain]
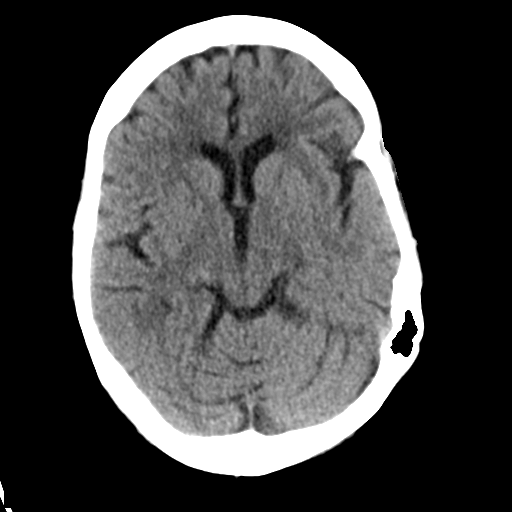
[im 15/30  brain]
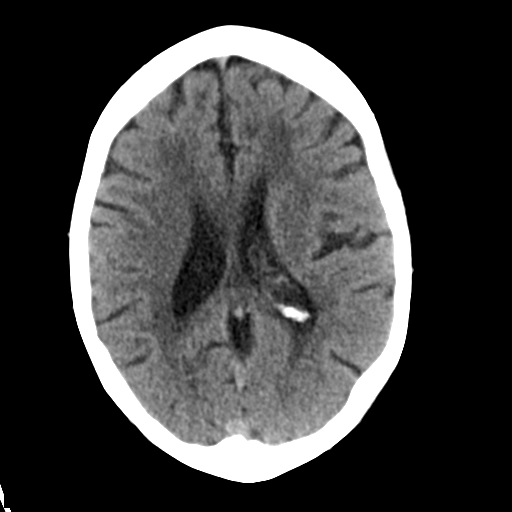
[im 19/30  brain]
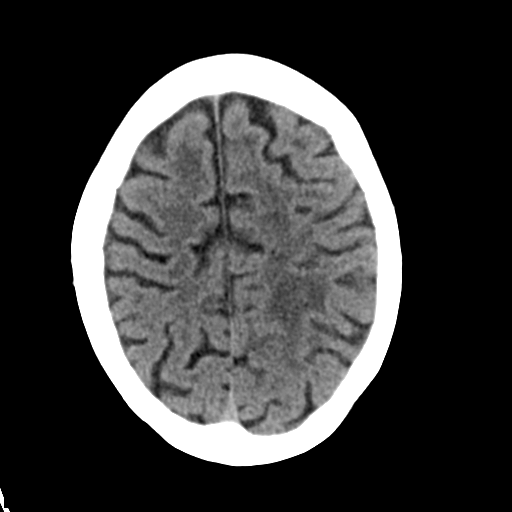
[im 19/30  bone]
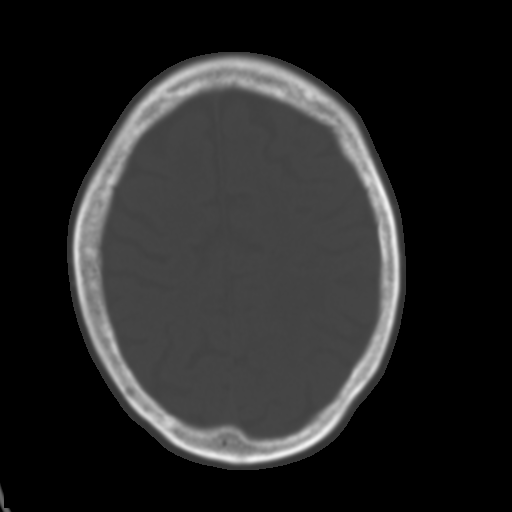
[im 22/30  brain]
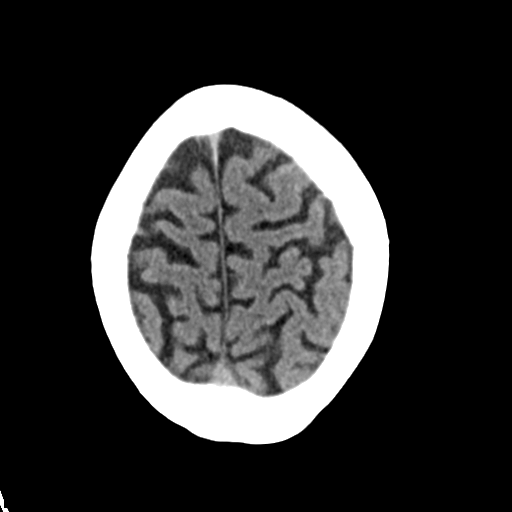
[im 26/30  brain]
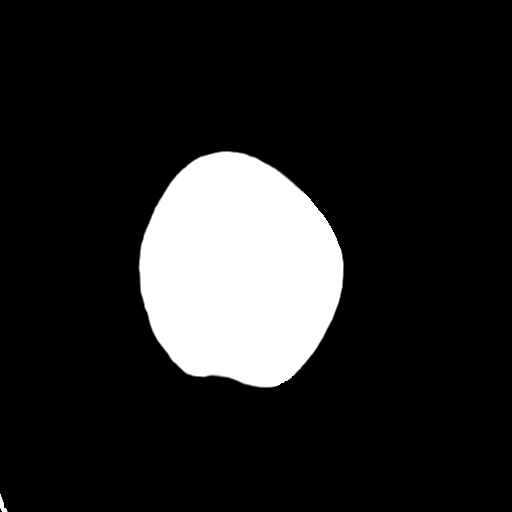

[Series 4: head bone · axial · 0.39mm/px · z∈[+1446,+1474]mm · 3 of 73 slices shown]
[im 8/73  bone]
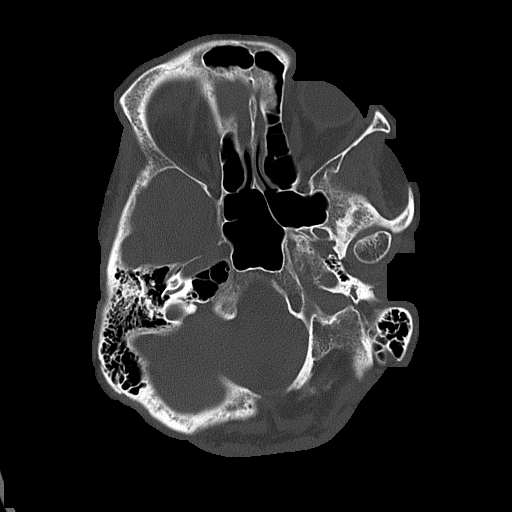
[im 15/73  bone]
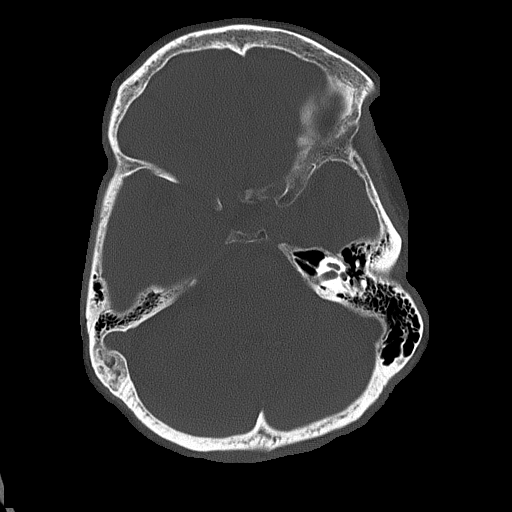
[im 22/73  bone]
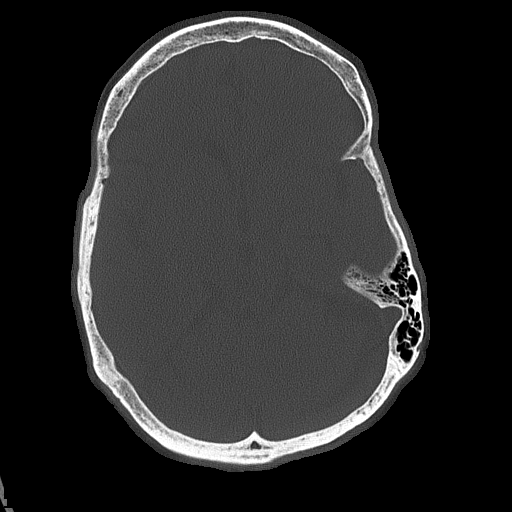

[Series 5: head without cor · coronal · non-contrast · 0.29mm/px · 3 of 65 slices shown]
[im 22/65  brain]
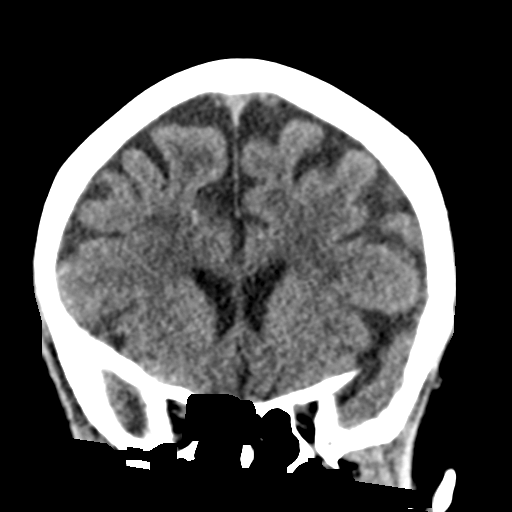
[im 29/65  brain]
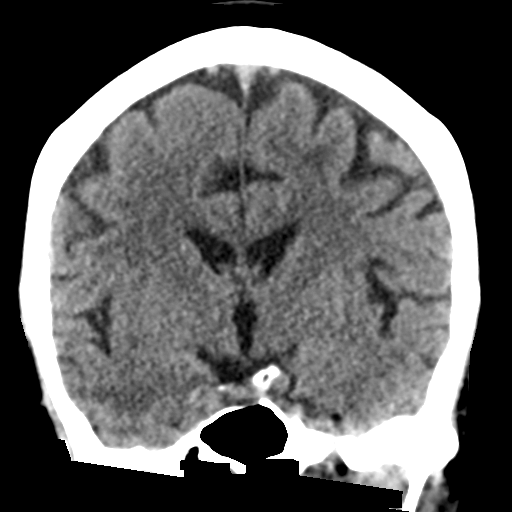
[im 36/65  brain]
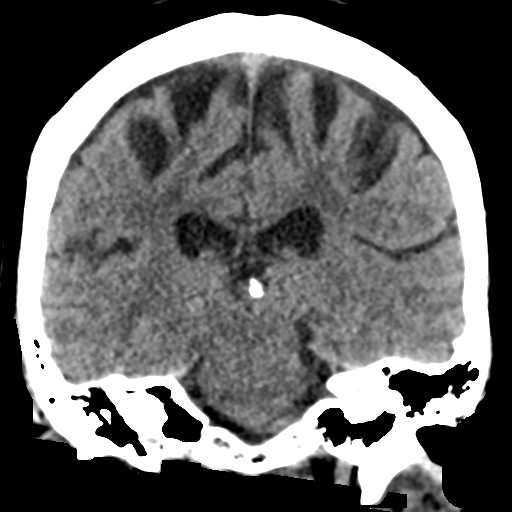

[Series 6: head without sag · sagittal · non-contrast · 0.29mm/px · 3 of 49 slices shown]
[im 17/49  brain]
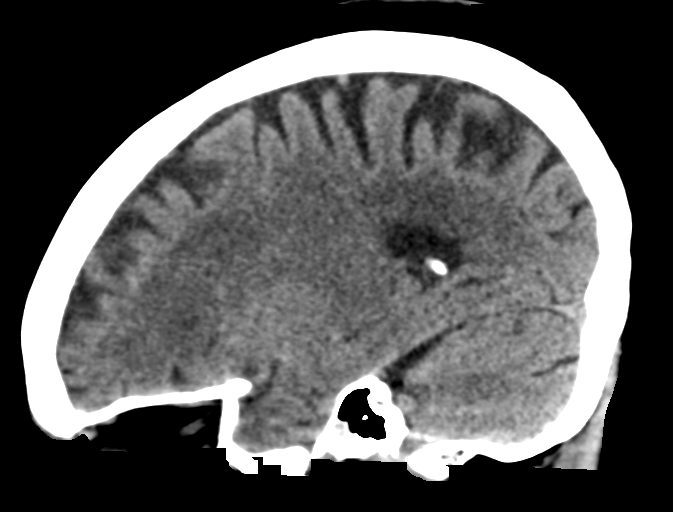
[im 25/49  brain]
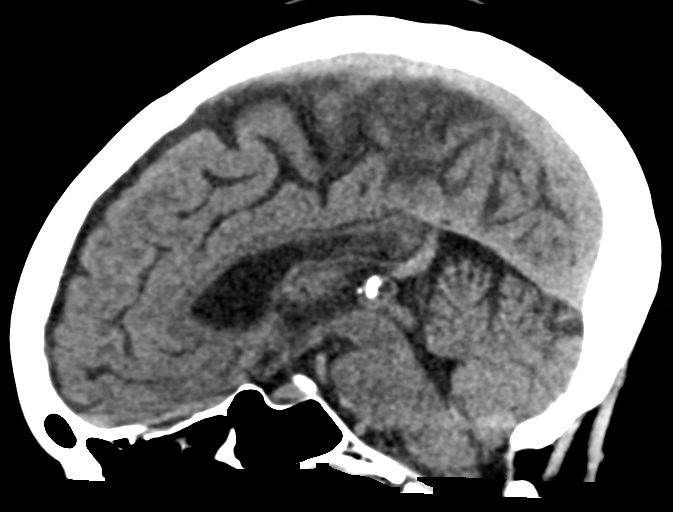
[im 32/49  brain]
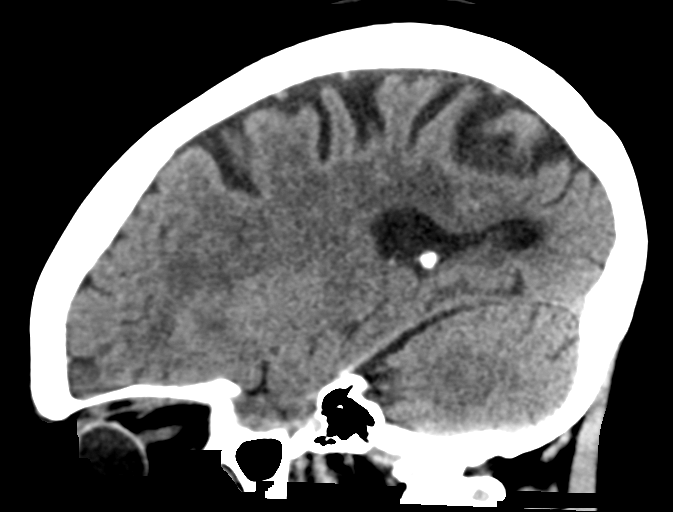

[16 of 47 positions shown; findings below may reference images not displayed]

FINDINGS: Brain: Interval mildly enlarged ventricles and cortical sulci.
Interval moderate patchy white matter low density in both cerebral
hemispheres. No intracranial hemorrhage, mass lesion or CT evidence
of acute infarction.

Vascular: No hyperdense vessel or unexpected calcification.

Skull: Normal. Negative for fracture or focal lesion.

Sinuses/Orbits: Unremarkable.

Other: None.
IMPRESSION: 1. No acute abnormality.
2. Interval mild diffuse cerebral and cerebellar atrophy.
3. Interval moderate chronic small vessel white matter ischemic
changes in both cerebral hemispheres.

## 2020-01-25 IMAGING — DX DG CHEST 1V PORT
1 series · 1 of 1 positions shown · non-contrast
Comparison: Radiograph 05/02/2019. radiograph 04/15/2018

CLINICAL DATA: Tingling. Left foot numbness and tingling.

EXAM:
PORTABLE CHEST 1 VIEW

[chest ap]
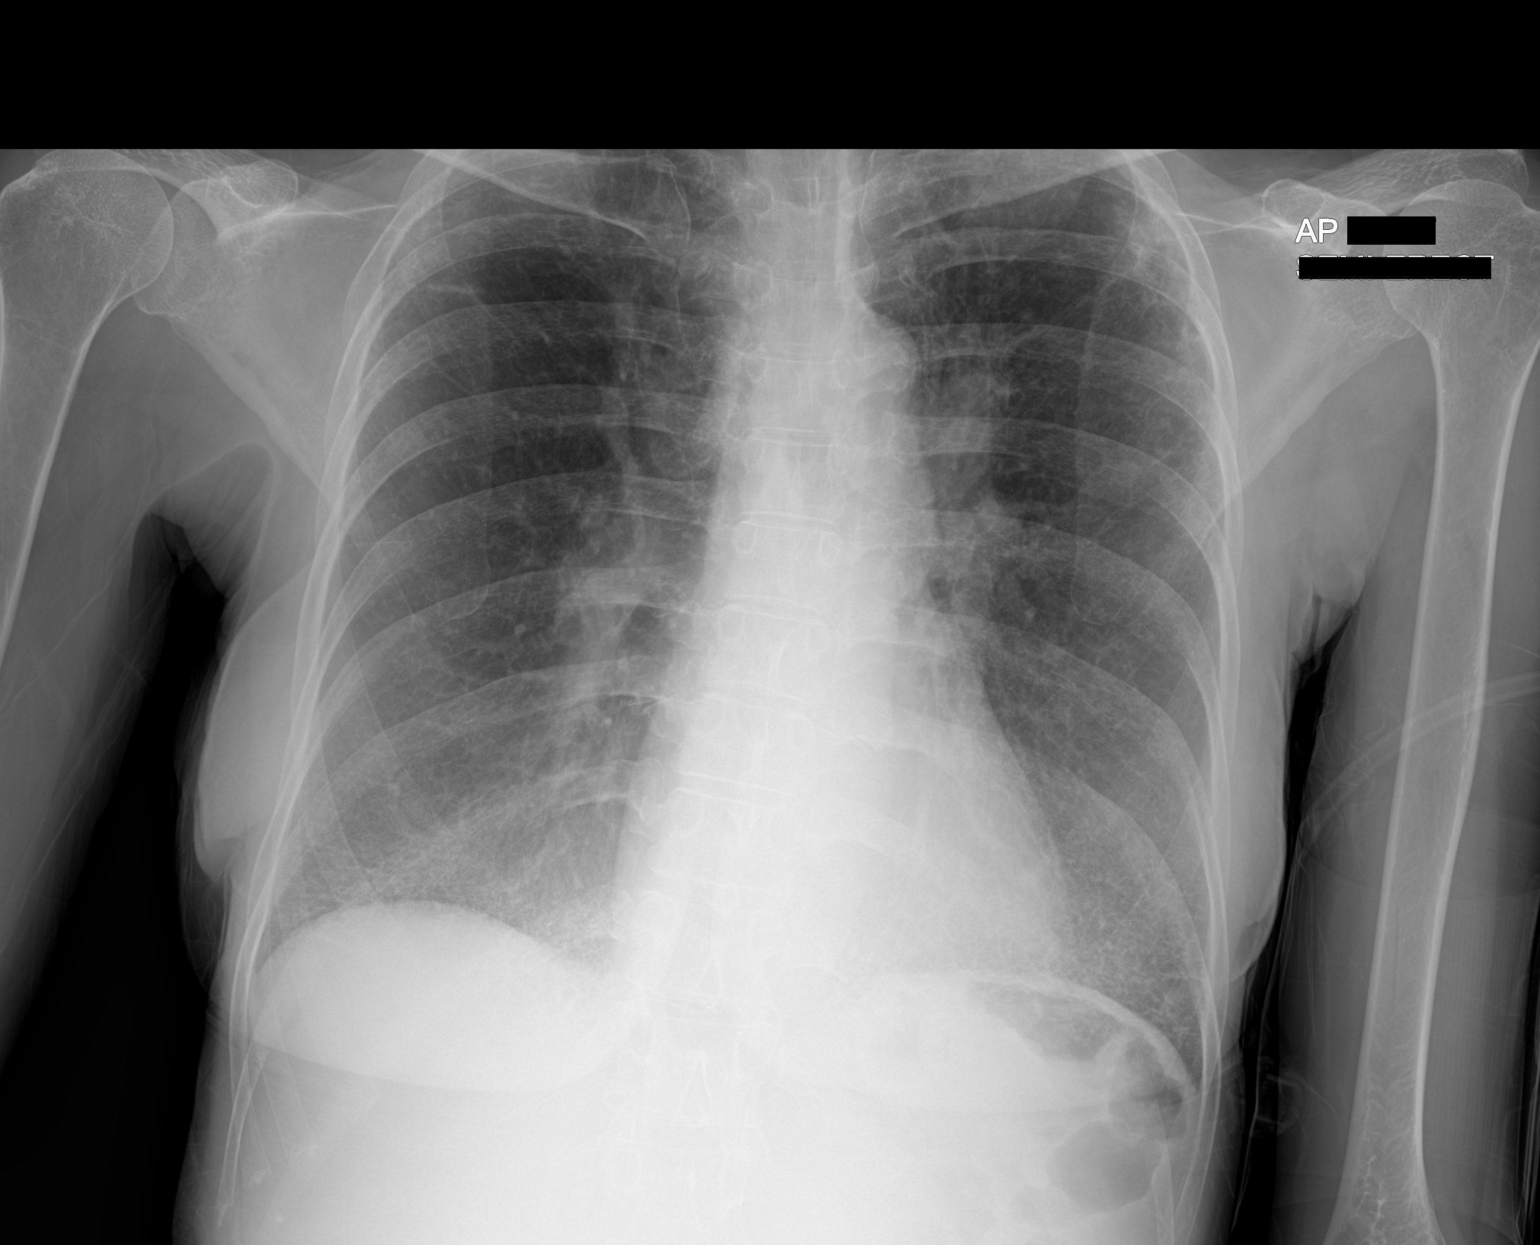

[1 of 1 positions shown; findings below may reference images not displayed]

FINDINGS: Mild hyperinflation. Fine interstitial opacities at the lung bases
may represent fibrosis. There is biapical and upper lobe predominant
pleuroparenchymal scarring. No acute airspace disease. Normal heart
size and mediastinal contours. No pulmonary edema. No pleural fluid
or pneumothorax. No acute osseous abnormalities.
IMPRESSION: 1. Chronic lung disease. Fine reticular opacities at the lung bases
are unchanged is suggest interstitial lung disease, possible
pulmonary fibrosis. Biapical and upper lobe pleuroparenchymal
scarring.
2. No superimposed acute abnormality.

## 2020-01-30 ENCOUNTER — Encounter (HOSPITAL_COMMUNITY): Payer: Self-pay

## 2020-01-30 ENCOUNTER — Other Ambulatory Visit: Payer: Self-pay

## 2020-01-30 ENCOUNTER — Emergency Department (HOSPITAL_COMMUNITY)
Admission: EM | Admit: 2020-01-30 | Discharge: 2020-01-30 | Disposition: A | Discharge status: Home / Self Care | Payer: Self-pay | Attending: Emergency Medicine | Admitting: Emergency Medicine

## 2020-01-30 ENCOUNTER — Emergency Department (HOSPITAL_COMMUNITY): Payer: Self-pay

## 2020-01-30 DIAGNOSIS — F10239 Alcohol dependence with withdrawal, unspecified: Secondary | ICD-10-CM | POA: Insufficient documentation

## 2020-01-30 DIAGNOSIS — Z79899 Other long term (current) drug therapy: Secondary | ICD-10-CM | POA: Insufficient documentation

## 2020-01-30 DIAGNOSIS — Z20822 Contact with and (suspected) exposure to covid-19: Secondary | ICD-10-CM | POA: Insufficient documentation

## 2020-01-30 DIAGNOSIS — R0602 Shortness of breath: Secondary | ICD-10-CM | POA: Insufficient documentation

## 2020-01-30 DIAGNOSIS — F10230 Alcohol dependence with withdrawal, uncomplicated: Secondary | ICD-10-CM

## 2020-01-30 DIAGNOSIS — Z7982 Long term (current) use of aspirin: Secondary | ICD-10-CM | POA: Insufficient documentation

## 2020-01-30 DIAGNOSIS — I10 Essential (primary) hypertension: Secondary | ICD-10-CM | POA: Insufficient documentation

## 2020-01-30 LAB — COMPREHENSIVE METABOLIC PANEL
ALT: 49 U/L — ABNORMAL HIGH (ref 0–44)
AST: 111 U/L — ABNORMAL HIGH (ref 15–41)
Albumin: 4.8 g/dL (ref 3.5–5.0)
Alkaline Phosphatase: 131 U/L — ABNORMAL HIGH (ref 38–126)
Anion gap: 18 — ABNORMAL HIGH (ref 5–15)
BUN: <5 mg/dL — ABNORMAL LOW (ref 6–20)
CO2: 22 mmol/L (ref 22–32)
Calcium: 9.9 mg/dL (ref 8.9–10.3)
Chloride: 88 mmol/L — ABNORMAL LOW (ref 98–111)
Creatinine, Ser: 0.63 mg/dL (ref 0.44–1.00)
GFR calc Af Amer: >60 mL/min (ref >60)
GFR calc non Af Amer: >60 mL/min (ref >60)
Glucose, Bld: 115 mg/dL — ABNORMAL HIGH (ref 70–99)
Potassium: 3.9 mmol/L (ref 3.5–5.1)
Sodium: 128 mmol/L — ABNORMAL LOW (ref 135–145)
Total Bilirubin: 1.2 mg/dL (ref 0.3–1.2)
Total Protein: 8.9 g/dL — ABNORMAL HIGH (ref 6.5–8.1)

## 2020-01-30 LAB — RAPID URINE DRUG SCREEN, HOSP PERFORMED
Amphetamines: NOT DETECTED
Barbiturates: NOT DETECTED
Benzodiazepines: NOT DETECTED
Cocaine: NOT DETECTED
Opiates: NOT DETECTED
Tetrahydrocannabinol: POSITIVE — AB

## 2020-01-30 LAB — CBC
HCT: 42 % (ref 36.0–46.0)
Hemoglobin: 14.9 g/dL (ref 12.0–15.0)
MCH: 30.5 pg (ref 26.0–34.0)
MCHC: 35.5 g/dL (ref 30.0–36.0)
MCV: 86.1 fL (ref 80.0–100.0)
Platelets: 417 10*3/uL — ABNORMAL HIGH (ref 150–400)
RBC: 4.88 MIL/uL (ref 3.87–5.11)
RDW: 14.8 % (ref 11.5–15.5)
WBC: 7.6 10*3/uL (ref 4.0–10.5)
nRBC: 0 % (ref 0.0–0.2)

## 2020-01-30 LAB — ETHANOL: Alcohol, Ethyl (B): <10 mg/dL (ref <10)

## 2020-01-30 LAB — CBG MONITORING, ED: Glucose-Capillary: 91 mg/dL (ref 70–99)

## 2020-01-30 LAB — SARS CORONAVIRUS 2 BY RT PCR (HOSPITAL ORDER, PERFORMED IN ~~LOC~~ HOSPITAL LAB): SARS Coronavirus 2: NEGATIVE

## 2020-01-30 MED ORDER — LORAZEPAM 2 MG/ML IJ SOLN: 0.0000 mg | Freq: Four times a day (QID) | INTRAMUSCULAR | Status: DC

## 2020-01-30 MED ORDER — CHLORDIAZEPOXIDE HCL 25 MG PO CAPS
25.0000 mg | ORAL_CAPSULE | Freq: Once | ORAL | Status: AC
  Administered 2020-01-30: 25 mg via ORAL
  Filled 2020-01-30: qty 1

## 2020-01-30 MED ORDER — CHLORDIAZEPOXIDE HCL 25 MG PO CAPS: ORAL_CAPSULE | ORAL | 0 refills | Status: DC

## 2020-01-30 MED ORDER — LORAZEPAM 2 MG/ML IJ SOLN: 0.0000 mg | Freq: Two times a day (BID) | INTRAMUSCULAR | Status: DC

## 2020-01-30 MED ORDER — THIAMINE HCL 100 MG/ML IJ SOLN: 100.0000 mg | Freq: Every day | INTRAMUSCULAR | Status: DC

## 2020-01-30 MED ORDER — THIAMINE HCL 100 MG PO TABS
100.0000 mg | ORAL_TABLET | Freq: Every day | ORAL | Status: DC
  Administered 2020-01-30: 100 mg via ORAL
  Filled 2020-01-30: qty 1

## 2020-01-30 MED ORDER — SODIUM CHLORIDE 0.9 % IV BOLUS
1000.0000 mL | Freq: Once | INTRAVENOUS | Status: AC
  Administered 2020-01-30: 1000 mL via INTRAVENOUS

## 2020-01-30 MED ORDER — LORAZEPAM 1 MG PO TABS: 0.0000 mg | ORAL_TABLET | Freq: Four times a day (QID) | ORAL | Status: DC

## 2020-01-30 MED ORDER — LORAZEPAM 1 MG PO TABS: 0.0000 mg | ORAL_TABLET | Freq: Two times a day (BID) | ORAL | Status: DC

## 2020-01-30 MED FILL — CHLORDIAZEPOXIDE 25 MG CAP: 25 | 6 days supply | Qty: 12 | Fill #0

## 2020-01-30 NOTE — ED Provider Notes (Signed)
[facility] EMERGENCY DEPARTMENT Provider Note   CSN: [id number] Arrival date & time: 01/30/20  0800     History Chief Complaint  Patient presents with  . Shortness of Breath  . Withdrawal    Regina [name] is a 54 y.o. female.  Who presents emergency department with a chief complaint of alcohol withdrawal.  The patient states that she just wants to stop drinking.  She has a history of previous issues with alcohol abuse.  Prior to February of this year, she was drinking 1 to 2 glasses of wine at night however her aunt died and she states "it got really out of control."  Patient was drinking up to 9, 24 ounce beers daily.  3 days ago she discontinued drinking and since that time she has had progressively worsening tachycardia, anxiety, tremulousness.  In the waiting room the patient was given a single dose of Librium with significant improvement in her blood pressure, tachycardia, anxiety, and tremulousness.  She no longer feels short of breath.  She denies fevers, chills, chest pain.  No we are HPI     Past Medical History:  Diagnosis Date  . Hypertension     Patient Active Problem List   Diagnosis Date Noted  . Hyponatremia 04/16/2018  . Depression 04/16/2018  . Hypertensive urgency   . HTN (hypertension) 01/13/2016  . Right anterior shoulder pain 07/18/2014  . Postconcussion syndrome 05/28/2014    Past Surgical History:  Procedure Laterality Date  . dental procedure       OB History   No obstetric history on file.     Family History  Problem Relation Age of Onset  . Leukemia Mother   . Hypertension Mother   . Hypertension Father   . Cancer Neg Hx   . Diabetes Neg Hx   . Heart disease Neg Hx     Social History   Tobacco Use  . Smoking status: Never Smoker  . Smokeless tobacco: Never Used  Substance Use Topics  . Alcohol use: Yes    Comment: social  . Drug use: No    Home Medications Prior to Admission medications   Medication  Sig Start Date End Date Taking? Authorizing Provider  albuterol (VENTOLIN HFA) 108 (90 Base) MCG/ACT inhaler Inhale 2 puffs into the lungs every 6 (six) hours as needed for wheezing or shortness of breath. 01/18/20  Yes Kerin Perna, NP  amLODipine (NORVASC) 10 MG tablet Take 1 tablet (10 mg total) by mouth daily. 01/18/20  Yes Kerin Perna, NP  aspirin EC 81 MG tablet Take 81 mg by mouth daily.   Yes [provider]  carvedilol (COREG) 3.125 MG tablet Take 1 tablet (3.125 mg total) by mouth 2 (two) times daily with a meal. 01/18/20  Yes Kerin Perna, NP  magnesium oxide (MAG-OX) 400 MG tablet Take 400 mg by mouth daily. 09/24/19  Yes [provider]  Multiple Vitamins-Minerals (CENTRUM SILVER 50+WOMEN) TABS Take 1 tablet by mouth daily.   Yes [provider]  nicotine (NICODERM CQ) 7 mg/24hr patch Place 1 patch (7 mg total) onto the skin daily. 01/18/20  Yes Kerin Perna, NP  chlordiazePOXIDE (LIBRIUM) 25 MG capsule Take 1 tablet 3 x a day for 2 days, Take 1 tablet 2 x a day for 2 days, Take 1 tablet daily for 2 days 01/30/20   Margarita Mail, PA-C  nicotine (NICODERM CQ) 14 mg/24hr patch Place 1 patch (14 mg total) onto the skin daily.  01/18/20   [name], NP  nicotine (NICODERM CQ) 21 mg/24hr patch Place 1 patch (21 mg total) onto the skin daily. 01/18/20   [name], NP    Allergies    Bee venom  Review of Systems   Review of Systems Ten systems reviewed and are negative for acute change, except as noted in the HPI.   Physical Exam Updated Vital Signs BP (!) 154/93   Pulse 86   Temp 98.2 F (36.8 C) (Oral)   Resp 10   Ht 5\' 6"  (1.676 m)   Wt 52.2 kg   LMP 06/06/2010   SpO2 100%   BMI 18.56 kg/m   Physical Exam Vitals and nursing note reviewed.  Constitutional:      General: She is not in acute distress.    Appearance: She is well-developed. She is not diaphoretic.  HENT:     Head: Normocephalic and  atraumatic.  Eyes:     General: No scleral icterus.    Conjunctiva/sclera: Conjunctivae normal.  Cardiovascular:     Rate and Rhythm: Normal rate and regular rhythm.     Heart sounds: Normal heart sounds. No murmur. No friction rub. No gallop.   Pulmonary:     Effort: Pulmonary effort is normal. No respiratory distress.     Breath sounds: Normal breath sounds.  Abdominal:     General: Bowel sounds are normal. There is no distension.     Palpations: Abdomen is soft. There is no mass.     Tenderness: There is no abdominal tenderness. There is no guarding.  Musculoskeletal:     Cervical back: Normal range of motion.  Skin:    General: Skin is warm and dry.  Neurological:     Mental Status: She is alert and oriented to person, place, and time.  Psychiatric:        Behavior: Behavior normal.     ED Results / Procedures / Treatments   Labs (all labs ordered are listed, but only abnormal results are displayed) Labs Reviewed  COMPREHENSIVE METABOLIC PANEL - Abnormal; Notable for the following components:      Result Value   Sodium 128 (*)    Chloride 88 (*)    Glucose, Bld 115 (*)    BUN <5 (*)    Total Protein 8.9 (*)    AST 111 (*)    ALT 49 (*)    Alkaline Phosphatase 131 (*)    Anion gap 18 (*)    All other components within normal limits  CBC - Abnormal; Notable for the following components:   Platelets 417 (*)    All other components within normal limits  RAPID URINE DRUG SCREEN, HOSP PERFORMED - Abnormal; Notable for the following components:   Tetrahydrocannabinol POSITIVE (*)    All other components within normal limits  SARS CORONAVIRUS 2 BY RT PCR (HOSPITAL ORDER, PERFORMED IN [facility])  ETHANOL  CBG MONITORING, ED    EKG None  Radiology DG Chest 2 View  Result Date: 01/30/2020 CLINICAL DATA:  Chest pain and shortness of breath. EXAM: CHEST - 2 VIEW COMPARISON:  01/05/2020 and 07/17/2019. FINDINGS: Trachea is midline. Heart size normal.  Biapical pleuroparenchymal scarring. Lungs are hyperinflated. Mild basilar predominant interstitial prominence. Findings are similar to 01/05/2020 that may be increased from 07/17/2019. No dense airspace consolidation. No pleural fluid. IMPRESSION: 1. Basilar interstitial prominence, similar to 01/05/2020 and possibly increased from 07/27/2019. Interstitial lung disease is favored. Nonemergent high-resolution chest CT could  be performed in further evaluation, as clinically indicated. 2. No definite superimposed acute finding. Electronically Signed   By: Leanna Battles M.D.   On: 01/30/2020 09:04    Procedures Procedures (including critical care time)  Medications Ordered in ED Medications  LORazepam (ATIVAN) injection 0-4 mg (0 mg Intravenous Not Given 01/30/20 1239)    Or  LORazepam (ATIVAN) tablet 0-4 mg ( Oral See Alternative 01/30/20 1239)  LORazepam (ATIVAN) injection 0-4 mg (has no administration in time range)    Or  LORazepam (ATIVAN) tablet 0-4 mg (has no administration in time range)  thiamine tablet 100 mg (100 mg Oral Given 01/30/20 1308)    Or  thiamine (B-1) injection 100 mg ( Intravenous See Alternative 01/30/20 1308)  chlordiazePOXIDE (LIBRIUM) capsule 25 mg (25 mg Oral Given 01/30/20 0819)  sodium chloride 0.9 % bolus 1,000 mL (0 mLs Intravenous Stopped 01/30/20 1502)    ED Course  I have reviewed the triage vital signs and the nursing notes.  Pertinent labs & imaging results that were available during my care of the patient were reviewed by me and considered in my medical decision making (see chart for details).  Clinical Course as of Jan 30 1536  Wed Jan 30, 2020  1158 Sodium(!): 128 [AH]    Clinical Course User Index [AH] Arthor Captain, PA-C   MDM Rules/Calculators/A&P                      Patient here with complaint of alcohol withdrawal.  I personally interpreted and reviewed the patient's labs.  Patient's sodium is slightly low in the setting of alcohol abuse.   Anion gap elevated likely secondary to alcoholic ketosis.  Patient given a bolus of fluid.  The remainder of her labs are without significant abnormality.  Patient responded well to a single dose of Librium.  I think this bodes well for outpatient detox therapy.  Patient will be given a Librium taper.  I have given the patient outpatient resources.  She denies suicidal or homicidal ideation and appears appropriate for discharge at this time Final Clinical Impression(s) / ED Diagnoses Final diagnoses:  Alcohol dependence with uncomplicated withdrawal [facility])    Rx / DC Orders ED Discharge Orders         Ordered    chlordiazePOXIDE (LIBRIUM) 25 MG capsule     01/30/20 1438           Arthor Captain, PA-C 01/30/20 1607    Gerhard Munch, MD 02/01/20 2354

## 2020-01-30 NOTE — ED Triage Notes (Signed)
Pt reports sob for the past 3 days and alcohol withdrawals. Pt drinks 3 24packs of beer a day, last drink was 3 days ago. Pt c.o nausea and a headache as well. CIWA 7

## 2020-01-30 NOTE — ED Notes (Signed)
Pt given turkey sandwich bag 

## 2020-01-30 NOTE — Discharge Instructions (Signed)
Contact a health care provider if:  Your symptoms get worse instead of better.  You cannot eat or drink without vomiting.  You are struggling with not drinking alcohol.  You cannot stop drinking alcohol.  Get help right away if:  You have an irregular heartbeat.  You have chest pain.  You have trouble breathing.  You have a seizure for the first time.  You hallucinate.  You become very confused.

## 2020-03-26 ENCOUNTER — Other Ambulatory Visit (INDEPENDENT_AMBULATORY_CARE_PROVIDER_SITE_OTHER): Payer: Self-pay | Admitting: Primary Care

## 2020-03-26 DIAGNOSIS — I10 Essential (primary) hypertension: Secondary | ICD-10-CM

## 2020-03-26 NOTE — Telephone Encounter (Signed)
Medication Refill - Medication: carvedilol (COREG) 3.125 MG tablet amLODipine (NORVASC) 10 MG tablet     Preferred Pharmacy (with phone number or street name):  [facility] - [place], [place] - [address] Phone:  [phone]  Fax:  [phone]       Agent: Please be advised that RX refills may take up to 3 business days. We ask that you follow-up with your pharmacy.

## 2020-04-03 MED FILL — AMLODIPINE BESYLATE 10 MG T: 10 | 30 days supply | Qty: 30 | Fill #1

## 2020-04-03 MED FILL — ?CARVEDILOL 3.125 MG TABLET: 3.125 | 30 days supply | Qty: 60 | Fill #1

## 2020-04-17 MED FILL — MIRTAZAPINE 15 MG TABLET: 15 | 30 days supply | Qty: 30 | Fill #0

## 2020-04-17 MED FILL — CARVEDILOL 3.125 MG TABLET: 3.125 | 30 days supply | Qty: 60 | Fill #0

## 2020-04-17 MED FILL — hydrOXYzine HCL 50 MG TABS: 50 | 30 days supply | Qty: 30 | Fill #0

## 2020-04-18 MED FILL — AMLODIPINE BESYLATE 10 MG T: 10 | 30 days supply | Qty: 30 | Fill #1

## 2020-05-30 MED FILL — CARVEDILOL 3.125 MG TABLET: 3.125 | 30 days supply | Qty: 60 | Fill #1

## 2020-05-30 MED FILL — AMLODIPINE BESYLATE 10 MG T: 10 | 30 days supply | Qty: 30 | Fill #2

## 2020-06-04 ENCOUNTER — Ambulatory Visit (HOSPITAL_COMMUNITY): Payer: Self-pay | Admitting: Psychiatry

## 2020-07-16 ENCOUNTER — Ambulatory Visit (HOSPITAL_COMMUNITY): Payer: Self-pay | Admitting: Psychiatry

## 2020-07-24 MED FILL — AMLODIPINE BESYLATE 10 MG T: 10 | 30 days supply | Qty: 30 | Fill #3

## 2020-07-24 MED FILL — CARVEDILOL 3.125 MG TABLET: 3.125 | 30 days supply | Qty: 60 | Fill #2

## 2020-08-13 MED FILL — AMLODIPINE BESYLATE 10 MG T: 10 | 30 days supply | Qty: 30 | Fill #3

## 2020-08-13 MED FILL — CARVEDILOL 3.125 MG TABLET: 3.125 | 30 days supply | Qty: 60 | Fill #2

## 2020-08-27 ENCOUNTER — Other Ambulatory Visit: Payer: Self-pay

## 2020-08-27 ENCOUNTER — Encounter (HOSPITAL_COMMUNITY): Payer: Self-pay | Admitting: Emergency Medicine

## 2020-08-27 ENCOUNTER — Emergency Department (HOSPITAL_COMMUNITY): Payer: Self-pay

## 2020-08-27 ENCOUNTER — Emergency Department (HOSPITAL_COMMUNITY)
Admission: EM | Admit: 2020-08-27 | Discharge: 2020-08-28 | Disposition: A | Discharge status: Home / Self Care | Payer: Self-pay | Attending: Emergency Medicine | Admitting: Emergency Medicine

## 2020-08-27 DIAGNOSIS — H9209 Otalgia, unspecified ear: Secondary | ICD-10-CM | POA: Insufficient documentation

## 2020-08-27 DIAGNOSIS — R0602 Shortness of breath: Secondary | ICD-10-CM | POA: Insufficient documentation

## 2020-08-27 DIAGNOSIS — R519 Headache, unspecified: Secondary | ICD-10-CM | POA: Insufficient documentation

## 2020-08-27 DIAGNOSIS — R079 Chest pain, unspecified: Secondary | ICD-10-CM | POA: Insufficient documentation

## 2020-08-27 DIAGNOSIS — Z5321 Procedure and treatment not carried out due to patient leaving prior to being seen by health care provider: Secondary | ICD-10-CM | POA: Insufficient documentation

## 2020-08-27 LAB — CBC
HCT: 37.3 % (ref 36.0–46.0)
Hemoglobin: 13.6 g/dL (ref 12.0–15.0)
MCH: 31.3 pg (ref 26.0–34.0)
MCHC: 36.5 g/dL — ABNORMAL HIGH (ref 30.0–36.0)
MCV: 85.7 fL (ref 80.0–100.0)
Platelets: 377 10*3/uL (ref 150–400)
RBC: 4.35 MIL/uL (ref 3.87–5.11)
RDW: 13.4 % (ref 11.5–15.5)
WBC: 7.4 10*3/uL (ref 4.0–10.5)
nRBC: 0 % (ref 0.0–0.2)

## 2020-08-27 NOTE — ED Triage Notes (Signed)
Pt c/o chest pain, shortness of breath, headache, and earache. Denies cough, fever.

## 2020-08-28 LAB — BASIC METABOLIC PANEL
Anion gap: 18 — ABNORMAL HIGH (ref 5–15)
BUN: <5 mg/dL — ABNORMAL LOW (ref 6–20)
CO2: 20 mmol/L — ABNORMAL LOW (ref 22–32)
Calcium: 9.6 mg/dL (ref 8.9–10.3)
Chloride: 88 mmol/L — ABNORMAL LOW (ref 98–111)
Creatinine, Ser: 0.64 mg/dL (ref 0.44–1.00)
GFR, Estimated: >60 mL/min (ref >60)
Glucose, Bld: 72 mg/dL (ref 70–99)
Potassium: 3.5 mmol/L (ref 3.5–5.1)
Sodium: 126 mmol/L — ABNORMAL LOW (ref 135–145)

## 2020-08-28 LAB — TROPONIN I (HIGH SENSITIVITY)
Troponin I (High Sensitivity): 7 ng/L (ref <18)
Troponin I (High Sensitivity): 8 ng/L (ref <18)

## 2020-08-28 NOTE — ED Notes (Signed)
Pt stated that she was going to another hospital and left the lobby at 07:45 am.

## 2020-09-19 ENCOUNTER — Emergency Department (HOSPITAL_COMMUNITY)
Admission: EM | Admit: 2020-09-19 | Discharge: 2020-09-20 | Disposition: A | Discharge status: Home / Self Care | Payer: HRSA Program | Attending: Emergency Medicine | Admitting: Emergency Medicine

## 2020-09-19 ENCOUNTER — Other Ambulatory Visit: Payer: Self-pay

## 2020-09-19 DIAGNOSIS — I10 Essential (primary) hypertension: Secondary | ICD-10-CM | POA: Diagnosis not present

## 2020-09-19 DIAGNOSIS — Z7982 Long term (current) use of aspirin: Secondary | ICD-10-CM | POA: Diagnosis not present

## 2020-09-19 DIAGNOSIS — Z79899 Other long term (current) drug therapy: Secondary | ICD-10-CM | POA: Insufficient documentation

## 2020-09-19 DIAGNOSIS — U071 COVID-19: Secondary | ICD-10-CM

## 2020-09-19 DIAGNOSIS — R112 Nausea with vomiting, unspecified: Secondary | ICD-10-CM

## 2020-09-19 DIAGNOSIS — H9201 Otalgia, right ear: Secondary | ICD-10-CM | POA: Diagnosis present

## 2020-09-19 NOTE — ED Triage Notes (Addendum)
Pt says for about 4-5 days she has had right ear pain, decreased appetite and some upper back pain. Denies fevers. Coughing up white color phlegm.

## 2020-09-20 LAB — SARS CORONAVIRUS 2 (TAT 6-24 HRS): SARS Coronavirus 2: POSITIVE — AB

## 2020-09-20 MED ORDER — IBUPROFEN 400 MG PO TABS: 400.0000 mg | ORAL_TABLET | Freq: Once | ORAL | Status: DC | PRN

## 2020-09-20 MED ORDER — ONDANSETRON 4 MG PO TBDP: 4.0000 mg | ORAL_TABLET | Freq: Three times a day (TID) | ORAL | 0 refills | Status: AC | PRN

## 2020-09-20 MED ORDER — ONDANSETRON 4 MG PO TBDP
4.0000 mg | ORAL_TABLET | Freq: Once | ORAL | Status: AC
  Administered 2020-09-20: 4 mg via ORAL
  Filled 2020-09-20: qty 1

## 2020-09-20 NOTE — ED Provider Notes (Signed)
[facility]  CSN: [id number] Arrival date & time: 09/19/20 2111  Chief Complaint(s) Otalgia  HPI Wynona CanesLabonnie Akard is a 55 y.o. female here with 3 to 4 days of right otalgia and cough.  She also has decreased appetite.  She reports nausea and nonbloody nonbilious emesis.  No fevers or chills.  No chest pain or shortness of breath.  No abdominal pain.  No diarrhea.  No other physical complaints.  Patient is vaccinated against COVID.  HPI  Past Medical History Past Medical History:  Diagnosis Date  . Hypertension    Patient Active Problem List   Diagnosis Date Noted  . Hyponatremia 04/16/2018  . Depression 04/16/2018  . Hypertensive urgency   . HTN (hypertension) 01/13/2016  . Right anterior shoulder pain 07/18/2014  . Postconcussion syndrome 05/28/2014   Home Medication(s) Prior to Admission medications   Medication Sig Start Date End Date Taking? Authorizing Provider  ondansetron (ZOFRAN ODT) 4 MG disintegrating tablet Take 1 tablet (4 mg total) by mouth every 8 (eight) hours as needed for up to 3 days for nausea or vomiting. 09/20/20 09/23/20 Yes Audreanna Torrisi, Amadeo GarnetPedro Eduardo, MD  albuterol (VENTOLIN HFA) 108 (90 Base) MCG/ACT inhaler Inhale 2 puffs into the lungs every 6 (six) hours as needed for wheezing or shortness of breath. 01/18/20   Grayce SessionsEdwards, Michelle P, NP  amLODipine (NORVASC) 10 MG tablet Take 1 tablet (10 mg total) by mouth daily. 01/18/20   Grayce SessionsEdwards, Michelle P, NP  aspirin EC 81 MG tablet Take 81 mg by mouth daily.    [provider]  carvedilol (COREG) 3.125 MG tablet Take 1 tablet (3.125 mg total) by mouth 2 (two) times daily with a meal. 01/18/20   Grayce SessionsEdwards, Michelle P, NP  chlordiazePOXIDE (LIBRIUM) 25 MG capsule Take 1 tablet 3 x a day for 2 days, Take 1 tablet 2 x a day for 2 days, Take 1 tablet daily for 2 days 01/30/20   Arthor CaptainHarris, Abigail, PA-C  magnesium oxide (MAG-OX) 400 MG tablet Take 400 mg by mouth daily.  09/24/19   [provider]  Multiple Vitamins-Minerals (CENTRUM SILVER 50+WOMEN) TABS Take 1 tablet by mouth daily.    [provider]  nicotine (NICODERM CQ) 14 mg/24hr patch Place 1 patch (14 mg total) onto the skin daily. 01/18/20   Grayce SessionsEdwards, Michelle P, NP  nicotine (NICODERM CQ) 21 mg/24hr patch Place 1 patch (21 mg total) onto the skin daily. 01/18/20   Grayce SessionsEdwards, Michelle P, NP  nicotine (NICODERM CQ) 7 mg/24hr patch Place 1 patch (7 mg total) onto the skin daily. 01/18/20   Grayce SessionsEdwards, Michelle P, NP                                                                                                                                    Past Surgical History Past Surgical History:  Procedure Laterality Date  . dental procedure  Family History Family History  Problem Relation Age of Onset  . Leukemia Mother   . Hypertension Mother   . Hypertension Father   . Cancer Neg Hx   . Diabetes Neg Hx   . Heart disease Neg Hx     Social History Social History   Tobacco Use  . Smoking status: Never Smoker  . Smokeless tobacco: Never Used  Substance Use Topics  . Alcohol use: Yes    Comment: social  . Drug use: No   Allergies Bee venom  Review of Systems Review of Systems All other systems are reviewed and are negative for acute change except as noted in the HPI  Physical Exam Vital Signs  I have reviewed the triage vital signs BP (!) 124/97 (BP Location: Right Arm)   Pulse (!) 101   Temp 99.2 F (37.3 C) (Oral)   Resp 16   LMP 06/06/2010   SpO2 100%   Physical Exam Vitals reviewed.  Constitutional:      General: She is not in acute distress.    Appearance: She is well-developed and well-nourished. She is not diaphoretic.  HENT:     Head: Normocephalic and atraumatic.     Comments: Edentulous    Right Ear: Tympanic membrane normal.     Left Ear: Tympanic membrane normal.     Nose: Nose normal.  Eyes:     General: No scleral icterus.       Right eye: No  discharge.        Left eye: No discharge.     Extraocular Movements: EOM normal.     Conjunctiva/sclera: Conjunctivae normal.     Pupils: Pupils are equal, round, and reactive to light.  Cardiovascular:     Rate and Rhythm: Normal rate and regular rhythm.     Heart sounds: No murmur heard. No friction rub. No gallop.   Pulmonary:     Effort: Pulmonary effort is normal. No respiratory distress.     Breath sounds: Normal breath sounds. No stridor. No rales.  Abdominal:     General: There is no distension.     Palpations: Abdomen is soft.     Tenderness: There is no abdominal tenderness.  Musculoskeletal:        General: No tenderness or edema.     Cervical back: Normal range of motion and neck supple.  Skin:    General: Skin is warm and dry.     Findings: No erythema or rash.  Neurological:     Mental Status: She is alert and oriented to person, place, and time.  Psychiatric:        Mood and Affect: Mood and affect normal.     ED Results and Treatments Labs (all labs ordered are listed, but only abnormal results are displayed) Labs Reviewed  SARS CORONAVIRUS 2 (TAT 6-24 HRS) - Abnormal; Notable for the following components:      Result Value   SARS Coronavirus 2 POSITIVE (*)    All other components within normal limits  EKG  EKG Interpretation  Date/Time:    Ventricular Rate:    PR Interval:    QRS Duration:   QT Interval:    QTC Calculation:   R Axis:     Text Interpretation:        Radiology No results found.  Pertinent labs & imaging results that were available during my care of the patient were reviewed by me and considered in my medical decision making (see chart for details).  Medications Ordered in ED Medications  ibuprofen (ADVIL) tablet 400 mg (has no administration in time range)  ondansetron (ZOFRAN-ODT) disintegrating tablet 4 mg  (4 mg Oral Given 09/20/20 0541)                                                                                                                                    Procedures Procedures  (including critical care time)  Medical Decision Making / ED Course I have reviewed the nursing notes for this encounter and the patient's prior records (if available in EHR or on provided paperwork).   Nohely Whitehorn was evaluated in Emergency Department on 09/20/2020 for the symptoms described in the history of present illness. She was evaluated in the context of the global COVID-19 pandemic, which necessitated consideration that the patient might be at risk for infection with the SARS-CoV-2 virus that causes COVID-19. Institutional protocols and algorithms that pertain to the evaluation of patients at risk for COVID-19 are in a state of rapid change based on information released by regulatory bodies including the CDC and federal and state organizations. These policies and algorithms were followed during the patient's care in the ED.  Patient presents with viral symptoms for 4-5 days. adequate oral hydration. Rest of history as above.  Patient appears well. No signs of toxicity, patient is interactive. No hypoxia, tachypnea or other signs of respiratory distress. No sign of clinical dehydration. Lung exam clear. Abd benign. Rest of exam as above.  Most consistent with viral illness. COVID+  No evidence suggestive of pharyngitis, AOM, PNA, or meningitis. Chest x-ray not indicated at this time. Tolerating oral intake after ODT zofran. Discussed symptomatic treatment with the patient and they will follow closely with their PCP.        Final Clinical Impression(s) / ED Diagnoses Final diagnoses:  COVID-19 virus infection  Nausea and vomiting in adult   The patient appears reasonably screened and/or stabilized for discharge and I doubt any other medical condition or other [facility] requiring further screening,  evaluation, or treatment in the ED at this time prior to discharge. Safe for discharge with strict return precautions.  Disposition: Discharge  Condition: Good  I have discussed the results, Dx and Tx plan with the patient/family who expressed understanding and agree(s) with the plan. Discharge instructions discussed at length. The patient/family was given strict return precautions who verbalized understanding of the instructions. No further questions at time of discharge.  ED Discharge Orders         Ordered    ondansetron (ZOFRAN ODT) 4 MG disintegrating tablet  Every 8 hours PRN        09/20/20 4098            Follow Up: [name], NP [address] [place] [address] [phone]  Call  As needed      This chart was dictated using voice recognition software.  Despite best efforts to proofread,  errors can occur which can change the documentation meaning.   Nira Conn, MD 09/20/20 [phone]

## 2020-10-09 ENCOUNTER — Ambulatory Visit (INDEPENDENT_AMBULATORY_CARE_PROVIDER_SITE_OTHER): Payer: Self-pay | Admitting: Primary Care

## 2020-10-15 MED FILL — ALBUTEROL SULFATE HFA 108 (: 108 (90 BAS | 25 days supply | Qty: 18 | Fill #1

## 2020-10-15 MED FILL — CARVEDILOL 3.125 MG TABLET: 3.125 | 30 days supply | Qty: 60 | Fill #3

## 2020-10-15 MED FILL — AMLODIPINE BESYLATE 10 MG T: 10 | 30 days supply | Qty: 30 | Fill #4

## 2020-10-30 ENCOUNTER — Other Ambulatory Visit: Payer: Self-pay

## 2020-10-30 ENCOUNTER — Ambulatory Visit (HOSPITAL_COMMUNITY)
Admission: EM | Admit: 2020-10-30 | Discharge: 2020-10-30 | Disposition: A | Discharge status: Home / Self Care | Payer: Self-pay | Attending: Family Medicine | Admitting: Family Medicine

## 2020-10-30 ENCOUNTER — Encounter (HOSPITAL_COMMUNITY): Payer: Self-pay

## 2020-10-30 DIAGNOSIS — S61112A Laceration without foreign body of left thumb with damage to nail, initial encounter: Secondary | ICD-10-CM

## 2020-10-30 DIAGNOSIS — Z23 Encounter for immunization: Secondary | ICD-10-CM

## 2020-10-30 MED ORDER — LIDOCAINE-EPINEPHRINE 1 %-1:100000 IJ SOLN
INTRAMUSCULAR | Status: AC
  Filled 2020-10-30: qty 1

## 2020-10-30 MED ORDER — LIDOCAINE-EPINEPHRINE-TETRACAINE (LET) TOPICAL GEL
TOPICAL | Status: AC
  Filled 2020-10-30: qty 3

## 2020-10-30 MED ORDER — LIDOCAINE-EPINEPHRINE-TETRACAINE (LET) TOPICAL GEL: 3.0000 mL | Freq: Once | TOPICAL | Status: DC

## 2020-10-30 MED ORDER — TETANUS-DIPHTH-ACELL PERTUSSIS 5-2.5-18.5 LF-MCG/0.5 IM SUSY
PREFILLED_SYRINGE | INTRAMUSCULAR | Status: AC
  Filled 2020-10-30: qty 0.5

## 2020-10-30 MED ORDER — TETANUS-DIPHTH-ACELL PERTUSSIS 5-2.5-18.5 LF-MCG/0.5 IM SUSY
0.5000 mL | PREFILLED_SYRINGE | Freq: Once | INTRAMUSCULAR | Status: AC
  Administered 2020-10-30: 0.5 mL via INTRAMUSCULAR

## 2020-10-30 NOTE — Discharge Instructions (Signed)
Change dressing twice daily for the next 10 days.  Wash with regular antibacterial soap and water.  After 10 days wear a regular Band-Aid bandage until wound heals while outside of the home and leave open to air while at home. Monitor for signs of infection.

## 2020-10-30 NOTE — ED Triage Notes (Signed)
Pt c/o laceration to left thumb tip while cutting cabbage this morning. Pt does not recall last Tetanus shot.

## 2020-10-30 NOTE — ED Provider Notes (Signed)
[facility]    CSN: [id number] Arrival date & time: 10/30/20  1139      History   Chief Complaint Chief Complaint  Patient presents with  . Extremity Laceration    Left thumb    HPI Regina [name] is a 55 y.o. female.   HPI  Patient with a medical history significant for hyponatremia and chronic alcohol use presents today with an avulsion of the left tip of her thumb.  She reports she was cutting cabbage and cut the tip of her finger off.  She also takes daily aspirin 81 mg.  She denies any loss of sensation or any other injury involving the left hand.  She is actively bleeding and has attempted to apply pressure with a towel however wound continues to bleed.s  Past Medical History:  Diagnosis Date  . Hypertension     Patient Active Problem List   Diagnosis Date Noted  . Hyponatremia 04/16/2018  . Depression 04/16/2018  . Hypertensive urgency   . HTN (hypertension) 01/13/2016  . Right anterior shoulder pain 07/18/2014  . Postconcussion syndrome 05/28/2014    Past Surgical History:  Procedure Laterality Date  . dental procedure      OB History   No obstetric history on file.      Home Medications    Prior to Admission medications   Medication Sig Start Date End Date Taking? Authorizing Provider  albuterol (VENTOLIN HFA) 108 (90 Base) MCG/ACT inhaler Inhale 2 puffs into the lungs every 6 (six) hours as needed for wheezing or shortness of breath. 01/18/20  Yes [name], NP  amLODipine (NORVASC) 10 MG tablet Take 1 tablet (10 mg total) by mouth daily. 01/18/20  Yes [name], NP  aspirin EC 81 MG tablet Take 81 mg by mouth daily.   Yes [provider]  carvedilol (COREG) 3.125 MG tablet Take 1 tablet (3.125 mg total) by mouth 2 (two) times daily with a meal. 01/18/20  Yes [name], NP  chlordiazePOXIDE (LIBRIUM) 25 MG capsule Take 1 tablet 3 x a day for 2 days, Take 1 tablet 2 x a day for 2 days, Take 1 tablet daily  for 2 days 01/30/20  Yes Abrina Petz, Abigail, PA-C  magnesium oxide (MAG-OX) 400 MG tablet Take 400 mg by mouth daily. 09/24/19  Yes [provider]  Multiple Vitamins-Minerals (CENTRUM SILVER 50+WOMEN) TABS Take 1 tablet by mouth daily.   Yes [provider]  nicotine (NICODERM CQ) 14 mg/24hr patch Place 1 patch (14 mg total) onto the skin daily. 01/18/20   [name], NP  nicotine (NICODERM CQ) 21 mg/24hr patch Place 1 patch (21 mg total) onto the skin daily. 01/18/20   [name], NP  nicotine (NICODERM CQ) 7 mg/24hr patch Place 1 patch (7 mg total) onto the skin daily. 01/18/20   [name], NP    Family History Family History  Problem Relation Age of Onset  . Leukemia Mother   . Hypertension Mother   . Hypertension Father   . Cancer Neg Hx   . Diabetes Neg Hx   . Heart disease Neg Hx     Social History Social History   Tobacco Use  . Smoking status: Former Smoker    Types: Cigarettes    Quit date: 10/31/2019    Years since quitting: 1.0  . Smokeless tobacco: Never Used  Vaping Use  . Vaping Use: Never used  Substance Use Topics  . Alcohol use: Yes  Comment: social  . Drug use: No     Allergies   Bee venom   Review of Systems Review of Systems Pertinent negatives listed in HPI   Physical Exam Triage Vital Signs ED Triage Vitals  Enc Vitals Group     BP 10/30/20 1158 119/80     Pulse Rate 10/30/20 1158 92     Resp 10/30/20 1158 18     Temp 10/30/20 1158 98.7 F (37.1 C)     Temp Source 10/30/20 1158 Oral     SpO2 10/30/20 1158 93 %     Weight 10/30/20 1156 118 lb (53.5 kg)     Height 10/30/20 1156 5\' 6"  (1.676 m)     Head Circumference --      Peak Flow --      Pain Score 10/30/20 1155 9     Pain Loc --      Pain Edu? --      Excl. in GC? --    No data found.  Updated Vital Signs BP 119/80 (BP Location: Right Arm)   Pulse 92   Temp 98.7 F (37.1 C) (Oral)   Resp 18   Ht 5\' 6"  (1.676 m)   Wt 118 lb (53.5  kg)   LMP 06/06/2010   SpO2 93%   BMI 19.05 kg/m   Visual Acuity Right Eye Distance:   Left Eye Distance:   Bilateral Distance:    Right Eye Near:   Left Eye Near:    Bilateral Near:     Physical Exam General appearance: alert, thin appearing, chronically ill looking.  No acute distress Head: Normocephalic, without obvious abnormality, atraumatic Respiratory: Respirations even and unlabored, normal respiratory rate Heart:Rate and rhythm normal. No gallop or murmurs noted on exam  Right/left radial pulse 2+, cap refill right and left distal middle digit <2 Extremities: Left thumb avulsion , proficiently bleeding nail remains intact Skin: Skin color, texture, turgor within normal limits  Psych: Appropriate mood and affect.  UC Treatments / Results  Labs (all labs ordered are listed, but only abnormal results are displayed) Labs Reviewed - No data to display  EKG   Radiology No results found.  Procedures Wound Care  Date/Time: 10/30/2020 1:02 PM Performed by: 08/06/2010, FNP Authorized by: 11/01/2020, FNP   Consent:    Consent obtained:  Verbal   Risks discussed:  Bleeding and incomplete drainage   Alternatives discussed:  No treatment Universal protocol:    Patient identity confirmed:  Verbally with patient Procedure details:    Indications: open wounds     Wound location:  Finger   Finger location:  L thumb   Wound age (days):  <1 Dressing:    Dressing applied:  Vaseline gauze, 4x4 and Kerlix   Wrapped with:  Coban 4 inch Post-procedure details:    Procedure completion:  Tolerated well, no immediate complications Comments:     Bandages remain in place for 24 hours.  Bleeding controlled.   (including critical care time)  Medications Ordered in UC Medications  lidocaine-EPINEPHrine-tetracaine (LET) topical gel (has no administration in time range)    Initial Impression / Assessment and Plan / UC Course  I have reviewed the triage vital  signs and the nursing notes.  Pertinent labs & imaging results that were available during my care of the patient were reviewed by me and considered in my medical decision making (see chart for details).    Left thumb avulsion injury severing the tip of  the thumb   Attempted to achieve hemostasis by using Dermabond however patient was bleeding profusely.  In addition to Dermabond had patient soak in 1-1 lidocaine and epi along with applying let and use of a tourniquet was able to achieve hemostasis to allow for a pressure dressing to be placed.  Patient advised to leave dressing in place for 24 hours.  After 24 hours change dressing.  Dressing was applied with Vaseline gauze and 4 x 4's and secured with Covan.  Advised patient to monitor for signs of infection.  Tylenol or ibuprofen for pain management.  Tetanus updated.  Return for evaluation for any concerns of infection.  Patient verbalized understanding and agreement with plan. Final Clinical Impressions(s) / UC Diagnoses   Final diagnoses:  Laceration of left thumb without foreign body with damage to nail, initial encounter   Discharge Instructions   None    ED Prescriptions    None     PDMP not reviewed this encounter.   Bing Neighbors, FNP 10/30/20 [phone]

## 2020-11-24 ENCOUNTER — Other Ambulatory Visit (INDEPENDENT_AMBULATORY_CARE_PROVIDER_SITE_OTHER): Payer: Self-pay | Admitting: Primary Care

## 2020-11-24 NOTE — Telephone Encounter (Signed)
   Notes to clinic:  script has expired  Review for refill and new script   Requested Prescriptions  Pending Prescriptions Disp Refills   albuterol (VENTOLIN HFA) 108 (90 Base) MCG/ACT inhaler [Pharmacy Med Name: ALBUTEROL SULFATE HFA 108 ( 108 (90 BAS Aerosol] 18 g 1    Sig: Inhale 2 puffs into the lungs every 6 (six) hours as needed for wheezing or shortness of breath.      Pulmonology:  Beta Agonists Failed - 11/24/2020  1:47 PM      Failed - One inhaler should last at least one month. If the patient is requesting refills earlier, contact the patient to check for uncontrolled symptoms.      Passed - Valid encounter within last 12 months    Recent Outpatient Visits           10 months ago Tobacco abuse   [facility] [name], NP   1 year ago Essential hypertension   [facility] [name], NP   1 year ago Loss of weight   [facility] [name], NP   1 year ago Medication refill   [facility] [name], NP   2 years ago Essential hypertension   [facility] Loletta Specter, PA-C

## 2020-12-08 ENCOUNTER — Other Ambulatory Visit (INDEPENDENT_AMBULATORY_CARE_PROVIDER_SITE_OTHER): Payer: Self-pay | Admitting: Primary Care

## 2020-12-08 ENCOUNTER — Other Ambulatory Visit: Payer: Self-pay

## 2020-12-08 ENCOUNTER — Ambulatory Visit (INDEPENDENT_AMBULATORY_CARE_PROVIDER_SITE_OTHER): Payer: Self-pay | Admitting: Primary Care

## 2020-12-08 DIAGNOSIS — I10 Essential (primary) hypertension: Secondary | ICD-10-CM

## 2020-12-08 MED ORDER — ALBUTEROL SULFATE HFA 108 (90 BASE) MCG/ACT IN AERS
2.0000 | INHALATION_SPRAY | Freq: Four times a day (QID) | RESPIRATORY_TRACT | 1 refills | Status: DC | PRN
  Filled 2020-12-08: qty 18, 25d supply, fill #0

## 2020-12-08 MED ORDER — AMLODIPINE BESYLATE 10 MG PO TABS
ORAL_TABLET | Freq: Every day | ORAL | 0 refills | Status: DC
  Filled 2020-12-08: qty 30, 30d supply, fill #0

## 2020-12-08 NOTE — Telephone Encounter (Signed)
Medication: albuterol (VENTOLIN HFA) 108 (90 Base) MCG/ACT inhaler [[id number]] , Rx #: [id number]  amLODipine (NORVASC) 10 MG tablet [[id number]] APT-12/24/20  Has the patient contacted their pharmacy? YES  (Agent: If no, request that the patient contact the pharmacy for the refill.) (Agent: If yes, when and what did the pharmacy advise?)  Preferred Pharmacy (with phone number or street name): [facility] [address] [place] [address] Phone: [phone] Fax: [phone] Hours: M-F 8:30a-5:30p    Agent: Please be advised that RX refills may take up to 3 business days. We ask that you follow-up with your pharmacy.

## 2020-12-08 NOTE — Telephone Encounter (Signed)
Requested Prescriptions  Pending Prescriptions Disp Refills  . amLODipine (NORVASC) 10 MG tablet 90 tablet 1    Sig: TAKE 1 TABLET (10 MG TOTAL) BY MOUTH DAILY.     Cardiovascular:  Calcium Channel Blockers Failed - 12/08/2020  8:06 AM      Failed - Valid encounter within last 6 months    Recent Outpatient Visits          10 months ago Tobacco abuse   [facility] [name], NP   1 year ago Essential hypertension   [facility] [name], NP   1 year ago Loss of weight   [facility] [name], NP   1 year ago Medication refill   [facility] [name], NP   2 years ago Essential hypertension   [facility] Loletta Specter, PA-C      Future Appointments            In 2 weeks [name], NP [facility]           Passed - Last BP in normal range    BP Readings from Last 1 Encounters:  10/30/20 119/80         . albuterol (VENTOLIN HFA) 108 (90 Base) MCG/ACT inhaler 18 g 1    Sig: INHALE 2 PUFFS INTO THE LUNGS EVERY 6 (SIX) HOURS AS NEEDED FOR WHEEZING OR SHORTNESS OF BREATH.     Pulmonology:  Beta Agonists Failed - 12/08/2020  8:06 AM      Failed - One inhaler should last at least one month. If the patient is requesting refills earlier, contact the patient to check for uncontrolled symptoms.      Passed - Valid encounter within last 12 months    Recent Outpatient Visits          10 months ago Tobacco abuse   [facility] [name], NP   1 year ago Essential hypertension   [facility] [name], NP   1 year ago Loss of weight   [facility] [name], NP   1 year ago Medication refill   [facility] [name], NP   2 years ago Essential hypertension   [facility]  [facility] Loletta Specter, PA-C      Future Appointments            In 2 weeks [name], NP [facility]

## 2020-12-24 ENCOUNTER — Other Ambulatory Visit: Payer: Self-pay

## 2020-12-24 ENCOUNTER — Ambulatory Visit (INDEPENDENT_AMBULATORY_CARE_PROVIDER_SITE_OTHER): Payer: Self-pay | Admitting: Primary Care

## 2020-12-24 ENCOUNTER — Encounter (INDEPENDENT_AMBULATORY_CARE_PROVIDER_SITE_OTHER): Payer: Self-pay | Admitting: Primary Care

## 2020-12-24 VITALS — BP 146/92 | HR 85 | Temp 97.9°F | Ht 66.0 in | Wt 110.2 lb

## 2020-12-24 DIAGNOSIS — I10 Essential (primary) hypertension: Secondary | ICD-10-CM

## 2020-12-24 DIAGNOSIS — Z20822 Contact with and (suspected) exposure to covid-19: Secondary | ICD-10-CM

## 2020-12-24 DIAGNOSIS — J302 Other seasonal allergic rhinitis: Secondary | ICD-10-CM

## 2020-12-24 DIAGNOSIS — Z760 Encounter for issue of repeat prescription: Secondary | ICD-10-CM

## 2020-12-24 MED ORDER — FLUTICASONE PROPIONATE 50 MCG/ACT NA SUSP
2.0000 | Freq: Every day | NASAL | 6 refills | Status: DC
Start: 2020-12-24 — End: 2023-09-20
  Filled 2020-12-24: qty 16, 30d supply, fill #0

## 2020-12-24 MED ORDER — AMLODIPINE BESYLATE 10 MG PO TABS
ORAL_TABLET | Freq: Every day | ORAL | 1 refills | Status: DC
  Filled 2020-12-24: qty 90, fill #0
  Filled 2021-04-29: qty 30, 30d supply, fill #0

## 2020-12-24 MED ORDER — CARVEDILOL 3.125 MG PO TABS
ORAL_TABLET | Freq: Two times a day (BID) | ORAL | 1 refills | Status: DC
  Filled 2020-12-24: qty 60, 30d supply, fill #0

## 2020-12-24 MED ORDER — LORATADINE 10 MG PO TABS
10.0000 mg | ORAL_TABLET | Freq: Every day | ORAL | 11 refills | Status: DC
  Filled 2020-12-24: qty 30, 30d supply, fill #0

## 2020-12-24 MED ORDER — ALBUTEROL SULFATE HFA 108 (90 BASE) MCG/ACT IN AERS
2.0000 | INHALATION_SPRAY | Freq: Four times a day (QID) | RESPIRATORY_TRACT | 1 refills | Status: DC | PRN
  Filled 2020-12-24: qty 18, 25d supply, fill #0

## 2020-12-24 NOTE — Progress Notes (Signed)
Established Patient Office Visit  Subjective:  Patient ID: Regina [name], female    DOB: Mar 05, 1966  Age: 55 y.o. MRN: [id number]  CC:  Chief Complaint  Patient presents with  . Hypertension  . Medication Refill    HPI Ms. Regina [name] is a 55 year old female who initially  presented for hypertension management and requesting medication refills. She denies shortness of breath, headaches, chest pain or lower extremity edema.  She finds that she feels like the symptoms she is having is the same she had when she was diagnosed with COVID diarrhea, cough at night, runny nose and fatigue.  She denies shortness of breath, fever, chills, or headaches.  Requesting to be tested to rule out COVID. Past Medical History:  Diagnosis Date  . Hypertension     Past Surgical History:  Procedure Laterality Date  . dental procedure      Family History  Problem Relation Age of Onset  . Leukemia Mother   . Hypertension Mother   . Hypertension Father   . Cancer Neg Hx   . Diabetes Neg Hx   . Heart disease Neg Hx     Social History   Socioeconomic History  . Marital status: Widowed    Spouse name: Not on file  . Number of children: 1   . Years of education: 18   . Highest education level: Not on file  Occupational History  . Occupation: Set designer: [employer]    Comment: Nurse, mental health   Tobacco Use  . Smoking status: Former Smoker    Types: Cigarettes    Quit date: 10/31/2019    Years since quitting: 1.1  . Smokeless tobacco: Never Used  Vaping Use  . Vaping Use: Never used  Substance and Sexual Activity  . Alcohol use: Yes    Comment: social  . Drug use: No  . Sexual activity: Not Currently  Other Topics Concern  . Not on file  Social History Narrative   Lives with her brother.    Son lives near by, 78 yo, married.    Social Determinants of Health   Financial Resource Strain: Not on file  Food Insecurity: Not on file  Transportation  Needs: Not on file  Physical Activity: Not on file  Stress: Not on file  Social Connections: Not on file  Intimate Partner Violence: Not on file    Outpatient Medications Prior to Visit  Medication Sig Dispense Refill  . aspirin EC 81 MG tablet Take 81 mg by mouth daily.    Marland Kitchen amLODipine (NORVASC) 10 MG tablet TAKE 1 TABLET (10 MG TOTAL) BY MOUTH DAILY. 30 tablet 0  . carvedilol (COREG) 3.125 MG tablet TAKE 1 TABLET (3.125 MG TOTAL) BY MOUTH 2 (TWO) TIMES DAILY WITH A MEAL. 60 tablet 3  . magnesium oxide (MAG-OX) 400 MG tablet Take 400 mg by mouth daily.    . Multiple Vitamins-Minerals (CENTRUM SILVER 50+WOMEN) TABS Take 1 tablet by mouth daily.    Marland Kitchen albuterol (VENTOLIN HFA) 108 (90 Base) MCG/ACT inhaler INHALE 2 PUFFS INTO THE LUNGS EVERY 6 (SIX) HOURS AS NEEDED FOR WHEEZING OR SHORTNESS OF BREATH. 18 g 1  . chlordiazePOXIDE (LIBRIUM) 25 MG capsule Take 1 tablet 3 x a day for 2 days, Take 1 tablet 2 x a day for 2 days, Take 1 tablet daily for 2 days 12 capsule 0  . nicotine (NICODERM CQ) 14 mg/24hr patch Place 1 patch (14 mg total) onto the skin daily.  28 patch 0  . nicotine (NICODERM CQ) 21 mg/24hr patch Place 1 patch (21 mg total) onto the skin daily. 28 patch 0  . nicotine (NICODERM CQ) 7 mg/24hr patch Place 1 patch (7 mg total) onto the skin daily. 28 patch 0   No facility-administered medications prior to visit.    Allergies  Allergen Reactions  . Bee Venom Hives    ROS Review of Systems  Constitutional: Positive for appetite change and fatigue.  HENT: Positive for congestion, rhinorrhea and sneezing.   Eyes: Positive for itching.       Watery   Respiratory: Positive for cough and chest tightness.   Gastrointestinal: Positive for diarrhea.  All other systems reviewed and are negative.     Objective:   Vitals:   12/24/20 1050  BP: (!) 146/92  Pulse: 85  Temp: 97.9 F (36.6 C)  TempSrc: Temporal  SpO2: 93%  Weight: 110 lb 3.2 oz (50 kg)  Height: 5\' 6"  (1.676 m)    Physical Exam  General: Vital signs reviewed.  Patient is well-developed and well-nourished, thin frame in no acute distress and cooperative with exam.  Head: Normocephalic and atraumatic. Eyes: EOMI, conjunctivae normal, no scleral icterus.  Neck: Supple, trachea midline, normal ROM, no JVD, masses, thyromegaly, or carotid bruit present.  Cardiovascular: RRR, S1 normal, S2 normal, no murmurs, gallops, or rubs. Pulmonary/Chest: Clear to auscultation bilaterally, no wheezes, rales, or rhonchi. Abdominal: Soft, non-tender, non-distended, BS +, no masses  Musculoskeletal: No joint deformities, erythema, or stiffness, ROM full and nontender. Extremities: No lower extremity edema bilaterally,  pulses symmetric and intact bilaterally. No cyanosis or clubbing. Neurological: A&O x3, Strength is normal and symmetric bilaterally, cranial nerve II-XII are grossly intact, no focal motor deficit, sensory intact to light touch bilaterally.  Skin: Warm, dry and intact. No rashes or erythema. Psychiatric: Normal mood and affect. speech and behavior is normal. Cognition and memory are normal.  Health Maintenance Due  Topic Date Due  . Hepatitis C Screening  Never done  . COVID-19 Vaccine (1) Never done  . PAP SMEAR-Modifier  Never done  . COLONOSCOPY (Pts 45-64yrs Insurance coverage will need to be confirmed)  Never done  . MAMMOGRAM  Never done    There are no preventive care reminders to display for this patient.  Lab Results  Component Value Date   TSH 4.444 05/02/2019   Lab Results  Component Value Date   WBC 7.4 08/27/2020   HGB 13.6 08/27/2020   HCT 37.3 08/27/2020   MCV 85.7 08/27/2020   PLT 377 08/27/2020   Lab Results  Component Value Date   NA 126 (L) 08/27/2020   K 3.5 08/27/2020   CO2 20 (L) 08/27/2020   GLUCOSE 72 08/27/2020   BUN <5 (L) 08/27/2020   CREATININE 0.64 08/27/2020   BILITOT 1.2 01/30/2020   ALKPHOS 131 (H) 01/30/2020   AST 111 (H) 01/30/2020   ALT 49 (H)  01/30/2020   PROT 8.9 (H) 01/30/2020   ALBUMIN 4.8 01/30/2020   CALCIUM 9.6 08/27/2020   ANIONGAP 18 (H) 08/27/2020   No results found for: CHOL No results found for: HDL No results found for: LDLCALC No results found for: TRIG No results found for: CHOLHDL No results found for: 08/29/2020    Assessment & Plan:  Bettina was seen today for hypertension and medication refill.  Diagnoses and all orders for this visit:  Essential hypertension Counseled on blood pressure goal of less than 130/80, low-sodium, DASH diet, medication compliance,  150 minutes of moderate intensity exercise per week. Discussed medication compliance, adverse effects. -     amLODipine (NORVASC) 10 MG tablet; TAKE 1 TABLET (10 MG TOTAL) BY MOUTH DAILY.  carvedilol (COREG) 3.125 MG tablet; TAKE 1 TABLET (3.125 MG TOTAL) BY MOUTH 2 (TWO) TIMES DAILY WITH A MEAL.  Medication refill -     amLODipine (NORVASC) 10 MG tablet; TAKE 1 TABLET (10 MG TOTAL) BY MOUTH DAILY. -     carvedilol (COREG) 3.125 MG tablet; TAKE 1 TABLET (3.125 MG TOTAL) BY MOUTH 2 (TWO) TIMES DAILY WITH A MEAL. -     albuterol (VENTOLIN HFA) 108 (90 Base) MCG/ACT inhaler; INHALE 2 PUFFS INTO THE LUNGS EVERY 6 (SIX) HOURS AS NEEDED FOR WHEEZING OR SHORTNESS OF BREATH.  Encounter for laboratory testing for COVID-19 virus Testing for COVID rule out versus sinuses.  Patient states the symptoms she is having now are the same when she was positive for COVID.  Signs and symptoms/ROS and HPI  Seasonal allergies It appears that you are struggling with allergies.  Fortunately, this is a common condition and can usually be managed with medication. -     loratadine (CLARITIN) 10 MG tablet; Take 1 tablet (10 mg total) by mouth daily. -     fluticasone (FLONASE) 50 MCG/ACT nasal spray; Place 2 sprays into both nostrils daily.   Meds ordered this encounter  Medications  . amLODipine (NORVASC) 10 MG tablet    Sig: TAKE 1 TABLET (10 MG TOTAL) BY MOUTH DAILY.     Dispense:  90 tablet    Refill:  1  . carvedilol (COREG) 3.125 MG tablet    Sig: TAKE 1 TABLET (3.125 MG TOTAL) BY MOUTH 2 (TWO) TIMES DAILY WITH A MEAL.    Dispense:  180 tablet    Refill:  1  . albuterol (VENTOLIN HFA) 108 (90 Base) MCG/ACT inhaler    Sig: INHALE 2 PUFFS INTO THE LUNGS EVERY 6 (SIX) HOURS AS NEEDED FOR WHEEZING OR SHORTNESS OF BREATH.    Dispense:  18 g    Refill:  1  . loratadine (CLARITIN) 10 MG tablet    Sig: Take 1 tablet (10 mg total) by mouth daily.    Dispense:  30 tablet    Refill:  11  . fluticasone (FLONASE) 50 MCG/ACT nasal spray    Sig: Place 2 sprays into both nostrils daily.    Dispense:  16 g    Refill:  6    Follow-up: Return in about 6 weeks (around 02/04/2021) for Bp/f/u and fasting labs.    [name], NP

## 2020-12-24 NOTE — Patient Instructions (Signed)
3 Key Steps to Take While Waiting for Your COVID-19 Test Result To help stop the spread of COVID-19, take these 3 key steps NOW while waiting for your test results: 1. Stay home and monitor your health. Stay home and monitor your health to help protect your friends, family, and others from possibly getting COVID-19 from you. Stay home and away from others:  If possible, stay away from others, especially people who are at higher risk for getting very sick from COVID-19, such as older adults and people with other medical conditions.  If you have been in contact with someone with COVID-19, stay home and away from others for 14 days after your last contact with that person. Follow the recommendations of your local public health department if you need to quarantine.  If you have a fever, cough or other symptoms of COVID-19, stay home and away from others (except to get medical care). Monitor your health:  Watch for fever, cough, shortness of breath, or other symptoms of COVID-19. Remember, symptoms may appear 2-14 days after exposure to COVID-19 and can include: ? Fever or chills ? Cough ? Shortness of breath or difficulty breathing ? Tiredness ? Muscle or body aches ? Headache ? New loss of taste or smell ? Sore throat ? Congestion or runny nose ? Nausea or vomiting ? Diarrhea 2. Think about the people you have recently been around. If you are diagnosed with COVID-19, a public health worker may call you to check on your health, discuss who you have been around, and ask where you spent time while you may have been able to spread COVID-19 to others. While you wait for your COVID-19 test result, think about everyone you have been around recently. This will be important information to give health workers if your test is positive.  Complete the information on the back of this page to help you remember everyone you have been around.  3. Answer the phone call from the health department. If a public  health worker calls you, answer the call to help slow the spread of COVID-19 in your community.  Discussions with health department staff are confidential. This means that your personal and medical information will be kept private and only shared with those who may need to know, like your health care provider.  Your name will not be shared with those you came in contact with. The health department will only notify people you were in close contact with (within 6 feet for more than 15 minutes) that they might have been exposed to COVID-19. Think about the people you have recently been around If you test positive and are diagnosed with COVID-19, someone from the health department may call to check-in on your health, discuss who you have been around, and ask where you spent time while you may have been able to spread COVID-19 to others. This form can help you think about people you have recently been around so you will be ready if a public health worker calls you. Things to think about. Have you:  Gone to work or school?  Gotten together with others (eaten out at a restaurant, gone out for drinks, exercised with others or gone to a gym, had friends or family over to your house, volunteered, gone to a party, pool, or park)?  Gone to a store in person (e.g., grocery store, mall)?  Gone to in-person appointments (e.g., salon, barber, doctor's or dentist's office)?  Ridden in a car with others (e.g., rideshare) or   taken public transportation?  Been inside a church, synagogue, mosque or other places of worship? Who lives with you?  ______________________________________________________________________  ______________________________________________________________________  ______________________________________________________________________  ______________________________________________________________________ Who have you been around (less than 6 feet for a total of 15 minutes or more) in the  last 10 days? (You may have more people to list than the space provided. If so, write on the front of this sheet or a separate piece of paper.) Name ______________________________________________  Phone number ____________________________________  Date you last saw them _____________________________  Where you last saw them ________________________________________________ Name ______________________________________________  Phone number ____________________________________  Date you last saw them _____________________________  Where you last saw them ________________________________________________ Name ______________________________________________  Phone number ____________________________________  Date you last saw them _____________________________  Where you last saw them ________________________________________________ Name ______________________________________________  Phone number ____________________________________  Date you last saw them _____________________________  Where you last saw them ________________________________________________ Name ______________________________________________  Phone number ____________________________________  Date you last saw them _____________________________  Where you last saw them ________________________________________________ What have you done in the last 10 days with other people? Activity _____________________________________________  Location _________________________________________  Date ____________________________________________ Activity _____________________________________________  Location _________________________________________  Date ____________________________________________ Activity _____________________________________________  Location _________________________________________  Date ____________________________________________ Activity _____________________________________________  Location  _________________________________________  Date ____________________________________________ Activity _____________________________________________  Location _________________________________________  Date ____________________________________________ Centers for Disease Control and Prevention cdc.gov/coronavirus 03/14/2020 This information is not intended to replace advice given to you by your health care provider. Make sure you discuss any questions you have with your health care provider. Document Revised: 07/07/2020 Document Reviewed: 07/07/2020 Elsevier Patient Education  2021 [employer]  

## 2020-12-25 ENCOUNTER — Other Ambulatory Visit: Payer: Self-pay

## 2020-12-25 LAB — SARS-COV-2, NAA 2 DAY TAT

## 2020-12-25 LAB — NOVEL CORONAVIRUS, NAA: SARS-CoV-2, NAA: NOT DETECTED

## 2020-12-29 ENCOUNTER — Telehealth (INDEPENDENT_AMBULATORY_CARE_PROVIDER_SITE_OTHER): Payer: Self-pay

## 2020-12-29 NOTE — Telephone Encounter (Signed)
-----   Message from [name], NP sent at 12/29/2020  1:49 PM EDT ----- SARS-CoV-2, NAA - Not Detected

## 2020-12-29 NOTE — Telephone Encounter (Signed)
Patient verified date of birth. She is aware that COVID screen was negative. Maryjean Morn, CMA

## 2021-01-10 ENCOUNTER — Emergency Department (HOSPITAL_COMMUNITY)
Admission: EM | Admit: 2021-01-10 | Discharge: 2021-01-11 | Disposition: A | Discharge status: Home / Self Care | Payer: Self-pay | Attending: Emergency Medicine | Admitting: Emergency Medicine

## 2021-01-10 ENCOUNTER — Other Ambulatory Visit: Payer: Self-pay

## 2021-01-10 ENCOUNTER — Encounter (HOSPITAL_COMMUNITY): Payer: Self-pay

## 2021-01-10 DIAGNOSIS — Z7982 Long term (current) use of aspirin: Secondary | ICD-10-CM | POA: Insufficient documentation

## 2021-01-10 DIAGNOSIS — E871 Hypo-osmolality and hyponatremia: Secondary | ICD-10-CM

## 2021-01-10 DIAGNOSIS — Z87891 Personal history of nicotine dependence: Secondary | ICD-10-CM | POA: Insufficient documentation

## 2021-01-10 DIAGNOSIS — I10 Essential (primary) hypertension: Secondary | ICD-10-CM | POA: Insufficient documentation

## 2021-01-10 DIAGNOSIS — G44209 Tension-type headache, unspecified, not intractable: Secondary | ICD-10-CM

## 2021-01-10 DIAGNOSIS — R0789 Other chest pain: Secondary | ICD-10-CM

## 2021-01-10 DIAGNOSIS — R0981 Nasal congestion: Secondary | ICD-10-CM | POA: Insufficient documentation

## 2021-01-10 DIAGNOSIS — Z79899 Other long term (current) drug therapy: Secondary | ICD-10-CM | POA: Insufficient documentation

## 2021-01-10 NOTE — ED Triage Notes (Signed)
Patient reports headache starting yesterday, states it started in the back of her head but is now on the top too, denies nausea

## 2021-01-11 ENCOUNTER — Emergency Department (HOSPITAL_COMMUNITY): Payer: Self-pay

## 2021-01-11 LAB — CBC WITH DIFFERENTIAL/PLATELET
Abs Immature Granulocytes: 0.01 10*3/uL (ref 0.00–0.07)
Basophils Absolute: 0.1 10*3/uL (ref 0.0–0.1)
Basophils Relative: 2 %
Eosinophils Absolute: 0.1 10*3/uL (ref 0.0–0.5)
Eosinophils Relative: 2 %
HCT: 32.7 % — ABNORMAL LOW (ref 36.0–46.0)
Hemoglobin: 11.4 g/dL — ABNORMAL LOW (ref 12.0–15.0)
Immature Granulocytes: 0 %
Lymphocytes Relative: 45 %
Lymphs Abs: 2.7 10*3/uL (ref 0.7–4.0)
MCH: 30.6 pg (ref 26.0–34.0)
MCHC: 34.9 g/dL (ref 30.0–36.0)
MCV: 87.9 fL (ref 80.0–100.0)
Monocytes Absolute: 0.6 10*3/uL (ref 0.1–1.0)
Monocytes Relative: 10 %
Neutro Abs: 2.6 10*3/uL (ref 1.7–7.7)
Neutrophils Relative %: 41 %
Platelets: 314 10*3/uL (ref 150–400)
RBC: 3.72 MIL/uL — ABNORMAL LOW (ref 3.87–5.11)
RDW: 13.4 % (ref 11.5–15.5)
WBC: 6.2 10*3/uL (ref 4.0–10.5)
nRBC: 0 % (ref 0.0–0.2)

## 2021-01-11 LAB — BASIC METABOLIC PANEL
Anion gap: 10 (ref 5–15)
BUN: <5 mg/dL — ABNORMAL LOW (ref 6–20)
CO2: 20 mmol/L — ABNORMAL LOW (ref 22–32)
Calcium: 8.8 mg/dL — ABNORMAL LOW (ref 8.9–10.3)
Chloride: 91 mmol/L — ABNORMAL LOW (ref 98–111)
Creatinine, Ser: 0.48 mg/dL (ref 0.44–1.00)
GFR, Estimated: >60 mL/min (ref >60)
Glucose, Bld: 75 mg/dL (ref 70–99)
Potassium: 4.6 mmol/L (ref 3.5–5.1)
Sodium: 121 mmol/L — ABNORMAL LOW (ref 135–145)

## 2021-01-11 LAB — TROPONIN I (HIGH SENSITIVITY)
Troponin I (High Sensitivity): 3 ng/L (ref <18)
Troponin I (High Sensitivity): 4 ng/L (ref <18)

## 2021-01-11 MED ORDER — SODIUM CHLORIDE 0.9 % IV BOLUS
500.0000 mL | Freq: Once | INTRAVENOUS | Status: AC
  Administered 2021-01-11: 500 mL via INTRAVENOUS

## 2021-01-11 MED ORDER — KETOROLAC TROMETHAMINE 30 MG/ML IJ SOLN
30.0000 mg | Freq: Once | INTRAMUSCULAR | Status: AC
  Administered 2021-01-11: 30 mg via INTRAVENOUS
  Filled 2021-01-11: qty 1

## 2021-01-11 NOTE — ED Provider Notes (Signed)
[facility] EMERGENCY DEPARTMENT Provider Note   CSN: [id number] Arrival date & time: 01/10/21  2315     History Chief Complaint  Patient presents with  . Headache    Regina [name] is a 55 y.o. female.  Patient presents to the emergency department for evaluation of multiple problems.  Patient reports headache that has been ongoing for 2 days.  She reports that it started as a pain across her forehead area associated with sinus congestion.  She takes Flonase and Claritin for her seasonal allergies but is having breakthrough symptoms.  Patient reports that the pain now is across the back of her head and neck area as well which is unusual for her.  No vision change, numbness, tingling, weakness of extremities.  No trouble with speech.  Patient came in tonight because she developed chest pain.  This evening she started to notice occasional sharp stabbing pains in the left chest.  Pain last for seconds and then resolves.  No associated shortness of breath or palpitations.        Past Medical History:  Diagnosis Date  . Hypertension     Patient Active Problem List   Diagnosis Date Noted  . Hyponatremia 04/16/2018  . Depression 04/16/2018  . Hypertensive urgency   . HTN (hypertension) 01/13/2016  . Right anterior shoulder pain 07/18/2014  . Postconcussion syndrome 05/28/2014    Past Surgical History:  Procedure Laterality Date  . dental procedure       OB History   No obstetric history on file.     Family History  Problem Relation Age of Onset  . Leukemia Mother   . Hypertension Mother   . Hypertension Father   . Cancer Neg Hx   . Diabetes Neg Hx   . Heart disease Neg Hx     Social History   Tobacco Use  . Smoking status: Former Smoker    Types: Cigarettes    Quit date: 10/31/2019    Years since quitting: 1.2  . Smokeless tobacco: Never Used  Vaping Use  . Vaping Use: Never used  Substance Use Topics  . Alcohol use: Yes    Comment:  social  . Drug use: No    Home Medications Prior to Admission medications   Medication Sig Start Date End Date Taking? Authorizing Provider  albuterol (VENTOLIN HFA) 108 (90 Base) MCG/ACT inhaler INHALE 2 PUFFS INTO THE LUNGS EVERY 6 (SIX) HOURS AS NEEDED FOR WHEEZING OR SHORTNESS OF BREATH. 12/24/20 12/24/21 Yes Edwards, Michelle P, NP  amLODipine (NORVASC) 10 MG tablet TAKE 1 TABLET (10 MG TOTAL) BY MOUTH DAILY. Patient taking differently: Take 10 mg by mouth daily. 12/24/20 12/24/21 Yes [name], NP  aspirin EC 81 MG tablet Take 81 mg by mouth daily.   Yes [provider]  carvedilol (COREG) 3.125 MG tablet TAKE 1 TABLET (3.125 MG TOTAL) BY MOUTH 2 (TWO) TIMES DAILY WITH A MEAL. Patient taking differently: Take 3.125 mg by mouth 2 (two) times daily with a meal. 12/24/20 12/24/21 Yes Edwards, Kinnie Scales, NP  fluticasone (FLONASE) 50 MCG/ACT nasal spray Place 2 sprays into both nostrils daily. Patient taking differently: Place 2 sprays into both nostrils daily as needed for allergies. 12/24/20  Yes [name], NP  ibuprofen (ADVIL) 200 MG tablet Take 400 mg by mouth every 6 (six) hours as needed for headache.   Yes [provider]  loratadine (CLARITIN) 10 MG tablet Take 1 tablet (10 mg total) by mouth daily.  Patient taking differently: Take 10 mg by mouth daily as needed for allergies. 12/24/20  Yes [name], NP  magnesium oxide (MAG-OX) 400 MG tablet Take 400 mg by mouth daily. 09/24/19  Yes [provider]    Allergies    Bee venom  Review of Systems   Review of Systems  Cardiovascular: Positive for chest pain.  Neurological: Positive for headaches.  All other systems reviewed and are negative.   Physical Exam Updated Vital Signs BP (!) 153/90   Pulse 87   Temp 98.4 F (36.9 C) (Oral)   Resp 17   Ht 5\' 6"  (1.676 m)   Wt 50 kg   LMP 06/06/2010   SpO2 99%   BMI 17.79 kg/m   Physical Exam Vitals and nursing note reviewed.   Constitutional:      General: She is not in acute distress.    Appearance: Normal appearance. She is well-developed.  HENT:     Head: Normocephalic and atraumatic.     Right Ear: Hearing normal.     Left Ear: Hearing normal.     Nose: Nose normal.  Eyes:     Conjunctiva/sclera: Conjunctivae normal.     Pupils: Pupils are equal, round, and reactive to light.  Cardiovascular:     Rate and Rhythm: Regular rhythm.     Heart sounds: S1 normal and S2 normal. No murmur heard. No friction rub. No gallop.   Pulmonary:     Effort: Pulmonary effort is normal. No respiratory distress.     Breath sounds: Normal breath sounds.  Chest:     Chest wall: No tenderness.  Abdominal:     General: Bowel sounds are normal.     Palpations: Abdomen is soft.     Tenderness: There is no abdominal tenderness. There is no guarding or rebound. Negative signs include Murphy's sign and McBurney's sign.     Hernia: No hernia is present.  Musculoskeletal:        General: Normal range of motion.     Cervical back: Normal range of motion and neck supple.  Skin:    General: Skin is warm and dry.     Findings: No rash.  Neurological:     Mental Status: She is alert and oriented to person, place, and time.     GCS: GCS eye subscore is 4. GCS verbal subscore is 5. GCS motor subscore is 6.     Cranial Nerves: No cranial nerve deficit.     Sensory: No sensory deficit.     Coordination: Coordination normal.  Psychiatric:        Speech: Speech normal.        Behavior: Behavior normal.        Thought Content: Thought content normal.     ED Results / Procedures / Treatments   Labs (all labs ordered are listed, but only abnormal results are displayed) Labs Reviewed  CBC WITH DIFFERENTIAL/PLATELET - Abnormal; Notable for the following components:      Result Value   RBC 3.72 (*)    Hemoglobin 11.4 (*)    HCT 32.7 (*)    All other components within normal limits  BASIC METABOLIC PANEL - Abnormal; Notable for  the following components:   Sodium 121 (*)    Chloride 91 (*)    CO2 20 (*)    BUN <5 (*)    Calcium 8.8 (*)    All other components within normal limits  TROPONIN I (HIGH SENSITIVITY)  TROPONIN  I (HIGH SENSITIVITY)    EKG EKG Interpretation  Date/Time:  Sunday Jan 11 2021 00:16:18 EDT Ventricular Rate:  83 PR Interval:  158 QRS Duration: 91 QT Interval:  373 QTC Calculation: 439 R Axis:   59 Text Interpretation: Sinus rhythm Probable left atrial enlargement Probable anterior infarct, age indeterminate No significant change since last tracing Confirmed by Gilda Crease [phone]) on 01/11/2021 12:20:19 AM   Radiology DG Chest 2 View  Result Date: 01/11/2021 CLINICAL DATA:  Chest pain.  Hypertension.  Former smoker quit 2021. EXAM: CHEST - 2 VIEW.  Patient is rotated. COMPARISON:  Chest x-ray 08/27/2020. FINDINGS: The heart size and mediastinal contours are unchanged. Slightly prominent main pulmonary artery. Emphysematous changes. Biapical pleural/pulmonary scarring similar appearing compared to chest x-ray 08/27/2020. No focal consolidation. Coarsened interstitial markings again noted with no overt pulmonary edema. Blunting of bilateral costophrenic angles with no definite pleural effusion. No pneumothorax. No acute osseous abnormality. IMPRESSION: 1. No active cardiopulmonary disease. 2. If clinically appropriate, please evaluate patient on age and smoking history to determine if patient meets the inclusion criteria for specialized low-dose lung cancer screening chest CT. Electronically Signed   By: Tish Frederickson M.D.   On: 01/11/2021 00:51    Procedures Procedures   Medications Ordered in ED Medications  sodium chloride 0.9 % bolus 500 mL (0 mLs Intravenous Stopped 01/11/21 0330)  ketorolac (TORADOL) 30 MG/ML injection 30 mg (30 mg Intravenous Given 01/11/21 0414)    ED Course  I have reviewed the triage vital signs and the nursing notes.  Pertinent labs & imaging  results that were available during my care of the patient were reviewed by me and considered in my medical decision making (see chart for details).    MDM Rules/Calculators/A&P                          Patient has been experiencing 2 separate problems.  She developed a headache 2 days ago.  She typically gets this type of headache related to her sinus issues.  She has had increased symptoms with increased pollen load lately, normally uses Claritin and Flonase.  She reports that her typical frontal headache now moved to the back of her head which is unusual.  This has been a gradual process over the last 2 days.  She has a normal neurologic exam.  No red flags that would suggest intracranial bleeding or other abnormality.  Blood work only shows hyponatremia.  She has chronic hyponatremia but sodium is slightly worse than baseline.  Given IV saline.  Will recommend follow-up with primary care this week for recheck.  Patient primarily came in tonight because of chest pain.  She is experiencing atypical pain.  She reports sharp pains that only last for a few seconds and then go away.  Not related to exertion.  Cardiac work-up reassuring. Final Clinical Impression(s) / ED Diagnoses Final diagnoses:  Acute non intractable tension-type headache  Hyponatremia  Non-cardiac chest pain    Rx / DC Orders ED Discharge Orders    None       Gilda Crease, MD 01/11/21 [phone]

## 2021-01-11 NOTE — ED Notes (Signed)
Pt reports headache that started yesterday. Reports having sharp intermittent chest pain that started today.

## 2021-03-31 ENCOUNTER — Other Ambulatory Visit: Payer: Self-pay

## 2021-04-02 ENCOUNTER — Other Ambulatory Visit: Payer: Self-pay

## 2021-04-02 MED ORDER — SERTRALINE HCL 50 MG PO TABS
ORAL_TABLET | ORAL | 0 refills | Status: DC
  Filled 2021-04-02: qty 30, 30d supply, fill #0

## 2021-04-06 ENCOUNTER — Other Ambulatory Visit: Payer: Self-pay

## 2021-04-29 ENCOUNTER — Other Ambulatory Visit: Payer: Self-pay

## 2021-04-30 ENCOUNTER — Other Ambulatory Visit: Payer: Self-pay

## 2021-05-01 ENCOUNTER — Other Ambulatory Visit: Payer: Self-pay

## 2021-05-08 ENCOUNTER — Other Ambulatory Visit: Payer: Self-pay

## 2021-05-08 ENCOUNTER — Other Ambulatory Visit (INDEPENDENT_AMBULATORY_CARE_PROVIDER_SITE_OTHER): Payer: Self-pay | Admitting: Primary Care

## 2021-05-20 ENCOUNTER — Other Ambulatory Visit: Payer: Self-pay

## 2021-06-04 ENCOUNTER — Telehealth (INDEPENDENT_AMBULATORY_CARE_PROVIDER_SITE_OTHER): Payer: Self-pay

## 2021-06-04 NOTE — Telephone Encounter (Signed)
Copied from CRM [id number]. 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

## 2021-06-23 ENCOUNTER — Ambulatory Visit (INDEPENDENT_AMBULATORY_CARE_PROVIDER_SITE_OTHER): Payer: Self-pay | Admitting: Primary Care

## 2021-07-20 ENCOUNTER — Ambulatory Visit (INDEPENDENT_AMBULATORY_CARE_PROVIDER_SITE_OTHER): Payer: Self-pay

## 2021-07-20 NOTE — Telephone Encounter (Signed)
Pt called in stating that she's been having weakness for several days now and passed out yesterday but she's unsure what's causing this. Says that she gets SOB with any kind of activity and has also had diarrhea for the past 4 days. Denies taking any new meds or changes in med doses. Attempted to schedule appt but first available was 07/29/21. Called [facility] but was placed on hold and due to long hold time hung up and advised pt she can do virtual UC visit or if she has UC close by to go there for evaluation. Pt says she will go to the UC at [facility] when her son gets off today. Care advice given and pt verbalized understanding. No other questions/concerns noted.    Reason for Disposition  [1] MODERATE weakness (i.e., interferes with work, school, normal activities) AND [2] persists > 3 days  Answer Assessment - Initial Assessment Questions 1. DESCRIPTION: "Describe how you are feeling."     Weak feeling  2. SEVERITY: "How bad is it?"  "Can you stand and walk?"   - MILD - Feels weak or tired, but does not interfere with work, school or normal activities   - MODERATE - Able to stand and walk; weakness interferes with work, school, or normal activities   - SEVERE - Unable to stand or walk     Moderate  3. ONSET:  "When did the weakness begin?"     4 days ago  4. CAUSE: "What do you think is causing the weakness?"     unsure 5. MEDICINES: "Have you recently started a new medicine or had a change in the amount of a medicine?"     no 6. OTHER SYMPTOMS: "Do you have any other symptoms?" (e.g., chest pain, fever, cough, SOB, vomiting, diarrhea, bleeding, other areas of pain)     Passed out yesterday, SOB with activity and diarrhea 7. PREGNANCY: "Is there any chance you are pregnant?" "When was your last menstrual period?"     No  Protocols used: Weakness (Generalized) and Fatigue-A-AH

## 2021-07-20 NOTE — Telephone Encounter (Signed)
FYI

## 2021-11-04 ENCOUNTER — Other Ambulatory Visit: Payer: Self-pay

## 2021-11-04 ENCOUNTER — Other Ambulatory Visit (HOSPITAL_BASED_OUTPATIENT_CLINIC_OR_DEPARTMENT_OTHER): Payer: Self-pay

## 2021-11-06 ENCOUNTER — Other Ambulatory Visit: Payer: Self-pay

## 2021-11-06 MED ORDER — ASPIRIN EC 81 MG PO TBEC: 81.0000 mg | DELAYED_RELEASE_TABLET | Freq: Every day | ORAL | 0 refills | Status: DC

## 2021-11-06 MED ORDER — AMLODIPINE BESYLATE 10 MG PO TABS
10.0000 mg | ORAL_TABLET | Freq: Every day | ORAL | 0 refills | Status: DC
  Filled 2021-11-06: qty 30, 30d supply, fill #0

## 2021-11-06 MED ORDER — CARVEDILOL 3.125 MG PO TABS
ORAL_TABLET | ORAL | 0 refills | Status: DC
  Filled 2021-11-06: qty 60, 30d supply, fill #0

## 2021-11-06 MED ORDER — SERTRALINE HCL 50 MG PO TABS
ORAL_TABLET | ORAL | 0 refills | Status: DC
  Filled 2021-11-06: qty 30, 30d supply, fill #0

## 2021-12-28 ENCOUNTER — Telehealth (HOSPITAL_COMMUNITY): Payer: Self-pay | Admitting: Primary Care

## 2021-12-28 ENCOUNTER — Ambulatory Visit (HOSPITAL_COMMUNITY)
Admission: EM | Admit: 2021-12-28 | Discharge: 2021-12-28 | Disposition: A | Discharge status: Home / Self Care | Payer: No Payment, Other

## 2021-12-28 NOTE — BH Assessment (Signed)
Care Management - [facility] Follow Up Discharges  ? ?Writer attempted to make contact with patient today and was unsuccessful.  Phone just rang.  ? ?

## 2021-12-28 NOTE — Progress Notes (Signed)
Pt went to upstairs to open assess after registration. Pt is discharge at this time. ?

## 2021-12-30 ENCOUNTER — Other Ambulatory Visit: Payer: Self-pay

## 2021-12-30 ENCOUNTER — Other Ambulatory Visit (HOSPITAL_COMMUNITY): Payer: Self-pay

## 2022-03-25 ENCOUNTER — Other Ambulatory Visit: Payer: Self-pay

## 2022-03-25 MED ORDER — SERTRALINE HCL 50 MG PO TABS
50.0000 mg | ORAL_TABLET | Freq: Every day | ORAL | 0 refills | Status: DC
  Filled 2022-03-25: qty 15, 15d supply, fill #0

## 2022-03-25 MED ORDER — AMLODIPINE BESYLATE 5 MG PO TABS
5.0000 mg | ORAL_TABLET | Freq: Every day | ORAL | 0 refills | Status: DC
  Filled 2022-03-25: qty 15, 15d supply, fill #0

## 2022-03-25 MED ORDER — NALTREXONE HCL 50 MG PO TABS
50.0000 mg | ORAL_TABLET | Freq: Every day | ORAL | 0 refills | Status: AC
Start: 2022-03-25 — End: ?
  Filled 2022-03-25: qty 15, 15d supply, fill #0

## 2022-03-25 MED ORDER — GABAPENTIN 300 MG PO CAPS
300.0000 mg | ORAL_CAPSULE | Freq: Three times a day (TID) | ORAL | 0 refills | Status: DC
  Filled 2022-03-25: qty 45, 15d supply, fill #0

## 2022-03-25 MED ORDER — VITAMIN D3 125 MCG (5000 UT) PO TABS: ORAL_TABLET | ORAL | 0 refills | Status: DC

## 2022-03-26 ENCOUNTER — Other Ambulatory Visit: Payer: Self-pay

## 2022-04-19 ENCOUNTER — Encounter (INDEPENDENT_AMBULATORY_CARE_PROVIDER_SITE_OTHER): Payer: Self-pay | Admitting: Primary Care

## 2022-04-19 ENCOUNTER — Ambulatory Visit (INDEPENDENT_AMBULATORY_CARE_PROVIDER_SITE_OTHER): Payer: Self-pay | Admitting: Primary Care

## 2022-04-19 ENCOUNTER — Other Ambulatory Visit: Payer: Self-pay

## 2022-04-19 VITALS — BP 185/102 | HR 83 | Temp 98.0°F | Ht 66.0 in | Wt 110.8 lb

## 2022-04-19 DIAGNOSIS — Z760 Encounter for issue of repeat prescription: Secondary | ICD-10-CM

## 2022-04-19 DIAGNOSIS — Z09 Encounter for follow-up examination after completed treatment for conditions other than malignant neoplasm: Secondary | ICD-10-CM

## 2022-04-19 DIAGNOSIS — I10 Essential (primary) hypertension: Secondary | ICD-10-CM

## 2022-04-19 MED ORDER — HYDROCHLOROTHIAZIDE 25 MG PO TABS
25.0000 mg | ORAL_TABLET | Freq: Every day | ORAL | 1 refills | Status: DC
  Filled 2022-04-19: qty 90, 90d supply, fill #0

## 2022-04-19 MED ORDER — CARVEDILOL 3.125 MG PO TABS
ORAL_TABLET | Freq: Two times a day (BID) | ORAL | 1 refills | Status: DC
  Filled 2022-04-19: qty 180, 60d supply, fill #0

## 2022-04-19 MED ORDER — AMLODIPINE BESYLATE 10 MG PO TABS
10.0000 mg | ORAL_TABLET | Freq: Every day | ORAL | 1 refills | Status: DC
  Filled 2022-04-19: qty 90, 90d supply, fill #0
  Filled 2022-12-27 – 2022-12-29 (×2): qty 90, 90d supply, fill #1

## 2022-04-19 NOTE — Progress Notes (Signed)
[facility]   Subjective:   Regina [name] is a 56 y.o. female presents for hospital follow up . She remains frail appearing but very excited she has not had a drink of alcohol for 3 months. Information provided solely by patient. Patient went to a facility for alcohol detox approximately 2 plus months and she will be attending AA meeting in person. Expressed PCP could not wait for her to start bringing me her coins for sobriety. She has been out of Bp medication and was not given enough to this appointment. 185/102. She has been drinking apple cider vinegar to try to control Bp until this appt.  Patient has No headache, No chest pain, No abdominal pain - No Nausea, No new weakness tingling or numbness, No Cough - shortness of breath   Past Medical History:  Diagnosis Date   Hypertension      Allergies  Allergen Reactions   Bee Venom Hives      Current Outpatient Medications on File Prior to Visit  Medication Sig Dispense Refill   amLODipine (NORVASC) 10 MG tablet Take 1 tablet (10 mg total) by mouth daily. 30 tablet 0   amLODipine (NORVASC) 5 MG tablet Take 1 tablet (5 mg total) by mouth daily. 15 tablet 0   aspirin EC 81 MG tablet Take 81 mg by mouth daily.     aspirin EC 81 MG tablet Take 1 tablet (81 mg total) by mouth daily. 30 tablet 0   carvedilol (COREG) 3.125 MG tablet Take 1 tablet (3.125 mg total) by mouth 2 times daily with meals. 60 tablet 0   Cholecalciferol (VITAMIN D3) 125 MCG (5000 UT) TABS Take 1 tab ONCE A DAY for vitamin insufficiency 15 tablet 0   fluticasone (FLONASE) 50 MCG/ACT nasal spray Place 2 sprays into both nostrils daily. (Patient taking differently: Place 2 sprays into both nostrils daily as needed for allergies.) 16 g 6   gabapentin (NEURONTIN) 300 MG capsule Take 1 capsule (300 mg total) by mouth 3 (three) times daily. 45 capsule 0   ibuprofen (ADVIL) 200 MG tablet Take 400 mg by mouth every 6 (six) hours as needed for headache.      loratadine (CLARITIN) 10 MG tablet Take 1 tablet (10 mg total) by mouth daily. (Patient taking differently: Take 10 mg by mouth daily as needed for allergies.) 30 tablet 11   magnesium oxide (MAG-OX) 400 MG tablet Take 400 mg by mouth daily.     naltrexone (DEPADE) 50 MG tablet Take 1 tablet (50 mg total) by mouth daily. for Alcohol dependence ,uncomplicated 15 tablet 0   sertraline (ZOLOFT) 50 MG tablet Take 1 tablet (50 mg total) by mouth daily for 30 days. 30 tablet 0   albuterol (VENTOLIN HFA) 108 (90 Base) MCG/ACT inhaler INHALE 2 PUFFS INTO THE LUNGS EVERY 6 (SIX) HOURS AS NEEDED FOR WHEEZING OR SHORTNESS OF BREATH. 18 g 1   amLODipine (NORVASC) 10 MG tablet TAKE 1 TABLET (10 MG TOTAL) BY MOUTH DAILY. (Patient taking differently: Take 10 mg by mouth daily.) 90 tablet 1   carvedilol (COREG) 3.125 MG tablet TAKE 1 TABLET (3.125 MG TOTAL) BY MOUTH 2 (TWO) TIMES DAILY WITH A MEAL. (Patient taking differently: Take 3.125 mg by mouth 2 (two) times daily with a meal.) 180 tablet 1   No current facility-administered medications on file prior to visit.   Review of System: Comprehensive ROS Pertinent positive and negative noted in HPI    Objective:  BP (!) 185/102  Pulse 83   Temp 98 F (36.7 C) (Oral)   Ht 5\' 6"  (1.676 m)   Wt 110 lb 12.8 oz (50.3 kg)   LMP 06/06/2010   SpO2 97%   BMI 17.88 kg/m   Filed Weights   04/19/22 1416  Weight: 110 lb 12.8 oz (50.3 kg)    Physical Exam: General Appearance: Body mass index is 17.88 kg/m. , in no apparent distress. Eyes: PERRLA, EOMs, conjunctiva no swelling or erythema Sinuses: No Frontal/maxillary tenderness ENT/Mouth: Ext aud canals clear, TMs without erythema, bulging. Hearing normal.  Neck: Supple, thyroid normal.  Respiratory: Respiratory effort normal, BS equal bilaterally without rales, rhonchi, wheezing or stridor.  Cardio: RRR with no MRGs. Brisk peripheral pulses without edema.  Abdomen: Soft, + BS.  Non tender, no guarding,  rebound, hernias, masses. Lymphatics: Non tender without lymphadenopathy.  Musculoskeletal: Full ROM, 5/5 strength, normal gait.  Skin: Warm, dry without rashes, lesions, ecchymosis.  Neuro: Cranial nerves intact. Normal muscle tone, no cerebellar symptoms. Sensation intact.  Psych: Awake and oriented X 3, normal affect, Insight and Judgment appropriate.    Assessment:  Marena was seen today for hospitalization follow-up.  Diagnoses and all orders for this visit:  Hospital discharge follow-up Hx obtain from patient treatment receive for alcoholism and has not had any alcohol in 3 months.  Essential hypertension BP goal - < 130/80 Explained that having normal blood pressure is the goal and medications are helping to get to goal and maintain normal blood pressure. DIET: Limit salt intake, read nutrition labels to check salt content, limit fried and high fatty foods  Avoid using multisymptom OTC cold preparations that generally contain sudafed which can rise BP. Consult with pharmacist on best cold relief products to use for persons with HTN EXERCISE Discussed incorporating exercise such as walking - 30 minutes most days of the week and can do in 10 minute intervals     Other orders/Medication refill -     hydrochlorothiazide (HYDRODIURIL) 25 MG tablet; Take 1 tablet (25 mg total) by mouth daily.  carvedilol (COREG) 3.125 MG tablet; TAKE 1 TABLET (3.125 MG TOTAL) BY MOUTH 2 (TWO) TIMES DAILY WITH A MEAL. -     amLODipine (NORVASC) 10 MG tablet; Take 1 tablet (10 mg total) by mouth daily.     This note has been created with 04/21/22. Any transcriptional errors are unintentional.   Education officer, environmental, NP 04/19/2022, 2:33 PM

## 2022-04-26 ENCOUNTER — Other Ambulatory Visit: Payer: Self-pay

## 2022-05-12 ENCOUNTER — Ambulatory Visit (INDEPENDENT_AMBULATORY_CARE_PROVIDER_SITE_OTHER): Payer: Self-pay | Admitting: Primary Care

## 2022-06-22 ENCOUNTER — Other Ambulatory Visit (INDEPENDENT_AMBULATORY_CARE_PROVIDER_SITE_OTHER): Payer: Self-pay | Admitting: Primary Care

## 2022-06-22 ENCOUNTER — Other Ambulatory Visit: Payer: Self-pay

## 2022-06-22 NOTE — Telephone Encounter (Signed)
Requested medication (s) are due for refill today: For review  Requested medication (s) are on the active medication list: yes    Last refill: 01/18/20 18g  1 refill  Future visit scheduled no  Notes to clinic: Please review. LRF 2021  Requested Prescriptions  Pending Prescriptions Disp Refills   albuterol (VENTOLIN HFA) 108 (90 Base) MCG/ACT inhaler 18 g 1    Sig: INHALE 2 PUFFS INTO THE LUNGS EVERY 6 (SIX) HOURS AS NEEDED FOR WHEEZING OR SHORTNESS OF BREATH.     Pulmonology:  Beta Agonists 2 Failed - 06/22/2022  9:14 AM      Failed - Last BP in normal range    BP Readings from Last 1 Encounters:  04/19/22 (!) 185/102         Passed - Last Heart Rate in normal range    Pulse Readings from Last 1 Encounters:  04/19/22 83         Passed - Valid encounter within last 12 months    Recent Outpatient Visits           2 months ago Hospital discharge follow-up   [place], Anthony, NP   1 year ago Essential hypertension   [place], [place], NP   2 years ago Tobacco abuse   [place] Kerin Perna, NP   2 years ago Essential hypertension   [place], NP   2 years ago Loss of weight   [place] Kerin Perna, NP

## 2022-06-25 NOTE — Telephone Encounter (Signed)
Will forward to provider  

## 2022-06-27 MED ORDER — ALBUTEROL SULFATE HFA 108 (90 BASE) MCG/ACT IN AERS
2.0000 | INHALATION_SPRAY | Freq: Four times a day (QID) | RESPIRATORY_TRACT | 1 refills | Status: DC | PRN
  Filled 2022-06-27: qty 6.7, 25d supply, fill #0

## 2022-06-28 ENCOUNTER — Other Ambulatory Visit: Payer: Self-pay

## 2022-07-05 ENCOUNTER — Other Ambulatory Visit: Payer: Self-pay

## 2022-07-07 ENCOUNTER — Ambulatory Visit (INDEPENDENT_AMBULATORY_CARE_PROVIDER_SITE_OTHER): Payer: Self-pay | Admitting: *Deleted

## 2022-07-07 NOTE — Telephone Encounter (Signed)
  Chief Complaint: vaginal discharge Symptoms: itching/irritation, thin discharge, odor Frequency: 1 week Pertinent Negatives: Patient denies sexual activity, abdominal pain Disposition: [] ED /[x] Urgent Care (no appt availability in office) / [] Appointment(In office/virtual)/ []  [place] [place] [facility]/ [] Home Care/ [] Refused Recommended Disposition /[]  Mobile Bus/ []  Follow-up with PCP Additional Notes: Patient has used OTC yeast treatment without relief. Patient states she is not sexually active. Patient may possibly have BV. No open appointment- advised UC- will send message to office- closed for lunch to see if patient can be put on schedule.

## 2022-07-07 NOTE — Telephone Encounter (Signed)
Summary: pt requesting med for a yeast infection asap/ no appt available   Pls fu with pt states yeast infection, last seen Aug 2023, no appt available till Dec, pt is wanting an antibiotic  to be called in. Call pt at [phone]     Reason for Disposition  Bad smelling vaginal discharge  Answer Assessment - Initial Assessment Questions 1. DISCHARGE: "Describe the discharge." (e.g., white, yellow, green, gray, foamy, cottage cheese-like)     Clear,whitish 2. ODOR: "Is there a bad odor?"     yes 3. ONSET: "When did the discharge begin?"     1 week 4. RASH: "Is there a rash in the genital area?" If Yes, ask: "Describe it." (e.g., redness, blisters, sores, bumps)     irritation 5. ABDOMEN PAIN: "Are you having any abdomen pain?" If Yes, ask: "What does it feel like? " (e.g., crampy, dull, intermittent, constant)      no 6. ABDOMEN PAIN SEVERITY: If present, ask: "How bad is it?" (e.g., Scale 1-10; mild, moderate, or severe)   - MILD (1-3): Doesn't interfere with normal activities, abdomen soft and not tender to touch.    - MODERATE (4-7): Interferes with normal activities or awakens from sleep, abdomen tender to touch.    - SEVERE (8-10): Excruciating pain, doubled over, unable to do any normal activities. (R/O peritonitis)      No 7. CAUSE: "What do you think is causing the discharge?" "Have you had the same problem before? What happened then?"     Possible BV 8. OTHER SYMPTOMS: "Do you have any other symptoms?" (e.g., fever, itching, vaginal bleeding, pain with urination, injury to genital area, vaginal foreign body)     irritation 9. PREGNANCY: "Is there any chance you are pregnant?" "When was your last menstrual period?"      na  Protocols used: Vaginal Discharge-A-AH

## 2022-07-07 NOTE — Telephone Encounter (Signed)
Pt is scheduled for a nurse visit for 11/2

## 2022-07-08 ENCOUNTER — Other Ambulatory Visit (HOSPITAL_COMMUNITY)
Admission: RE | Admit: 2022-07-08 | Discharge: 2022-07-08 | Disposition: A | Discharge status: Home / Self Care | Payer: Self-pay | Source: Ambulatory Visit | Attending: Primary Care | Admitting: Primary Care

## 2022-07-08 ENCOUNTER — Ambulatory Visit (INDEPENDENT_AMBULATORY_CARE_PROVIDER_SITE_OTHER): Payer: Self-pay

## 2022-07-08 DIAGNOSIS — N898 Other specified noninflammatory disorders of vagina: Secondary | ICD-10-CM | POA: Insufficient documentation

## 2022-07-09 LAB — CERVICOVAGINAL ANCILLARY ONLY
Bacterial Vaginitis (gardnerella): NEGATIVE
Candida Glabrata: NEGATIVE
Candida Vaginitis: NEGATIVE
Chlamydia: NEGATIVE
Comment: NEGATIVE
Comment: NEGATIVE
Comment: NEGATIVE
Comment: NEGATIVE
Comment: NEGATIVE
Comment: NORMAL
Neisseria Gonorrhea: NEGATIVE
Trichomonas: POSITIVE — AB

## 2022-07-13 ENCOUNTER — Telehealth: Payer: Self-pay | Admitting: Primary Care

## 2022-07-13 NOTE — Telephone Encounter (Signed)
Will call pt once provider look at results  Regina [name] can look at results and release them so I can call pt

## 2022-07-13 NOTE — Telephone Encounter (Signed)
Copied from [place] [id number]. Topic: General - Inquiry >> Jul 13, 2022  9:28 AM Chapman Fitch wrote: Reason for CRM: Pt would like a call about her swab result / please advise

## 2022-07-14 ENCOUNTER — Other Ambulatory Visit (INDEPENDENT_AMBULATORY_CARE_PROVIDER_SITE_OTHER): Payer: Self-pay | Admitting: Primary Care

## 2022-07-14 ENCOUNTER — Other Ambulatory Visit: Payer: Self-pay

## 2022-07-14 DIAGNOSIS — A599 Trichomoniasis, unspecified: Secondary | ICD-10-CM

## 2022-07-14 MED ORDER — METRONIDAZOLE 500 MG PO TABS
500.0000 mg | ORAL_TABLET | Freq: Two times a day (BID) | ORAL | 0 refills | Status: DC
  Filled 2022-07-14: qty 14, 7d supply, fill #0

## 2022-07-15 ENCOUNTER — Telehealth (INDEPENDENT_AMBULATORY_CARE_PROVIDER_SITE_OTHER): Payer: Self-pay

## 2022-07-15 NOTE — Telephone Encounter (Signed)
Contacted pt to go over lab results pt is aware and doesn't have any questions or concerns 

## 2022-09-08 ENCOUNTER — Telehealth (INDEPENDENT_AMBULATORY_CARE_PROVIDER_SITE_OTHER): Payer: Self-pay | Admitting: Primary Care

## 2022-09-08 ENCOUNTER — Encounter (INDEPENDENT_AMBULATORY_CARE_PROVIDER_SITE_OTHER): Payer: Self-pay | Admitting: Primary Care

## 2022-09-08 NOTE — Telephone Encounter (Signed)
This patient stated she need a refill on two meds. Sertraline, and Naltrexone.

## 2022-09-08 NOTE — Progress Notes (Signed)
Patient ID: Regina [name], female   DOB: 07/22/1966, 57 y.o.   MRN: [id number] This patient stated she need a refill on two meds. Sertraline, and Naltrexone.

## 2022-09-13 NOTE — Telephone Encounter (Signed)
Requested medication (s) are due for refill today:   Provider to review  Requested medication (s) are on the active medication list:   Yes for both  Future visit scheduled:   No   Last ordered: Depade 03/25/2022 #15, 0 refills;   Zoloft 04/02/2021 #30, 0 refills  Returned because Depade is non delegated.    Zoloft is from 2022 not sure pt still on it.   Just for 30 days.   Requested Prescriptions  Pending Prescriptions Disp Refills   naltrexone (DEPADE) 50 MG tablet 15 tablet 0    Sig: Take 1 tablet (50 mg total) by mouth daily. for Alcohol dependence ,uncomplicated     Not Delegated - Psychiatry: Drug Dependence Therapy - naltrexone Failed - 09/13/2022 12:29 PM      Failed - This refill cannot be delegated      Failed - Completed PHQ-2 or PHQ-9 in the last 360 days      Passed - Valid encounter within last 6 months    Recent Outpatient Visits           4 months ago Hospital discharge follow-up   [place], [place], NP   1 year ago Essential hypertension   [place], [place], NP   2 years ago Tobacco abuse   [place], [place], NP   3 years ago Essential hypertension   [place] Kerin Perna, NP   3 years ago Loss of weight   [place] Juluis Mire P, NP               sertraline (ZOLOFT) 50 MG tablet 30 tablet 0    Sig: Take 1 tablet (50 mg total) by mouth daily for 30 days.     Psychiatry:  Antidepressants - SSRI - sertraline Failed - 09/13/2022 12:29 PM      Failed - AST in normal range and within 360 days    AST  Date Value Ref Range Status  01/30/2020 111 (H) 15 - 41 U/L Final         Failed - ALT in normal range and within 360 days    ALT  Date Value Ref Range Status  01/30/2020 49 (H) 0 - 44 U/L Final         Failed - Completed PHQ-2 or PHQ-9 in the last 360 days      Passed - Valid  encounter within last 6 months    Recent Outpatient Visits           4 months ago Hospital discharge follow-up   [place], [place], NP   1 year ago Essential hypertension   [place], [place], NP   2 years ago Tobacco abuse   [place] Kerin Perna, NP   3 years ago Essential hypertension   [place], NP   3 years ago Loss of weight   [place] Kerin Perna, NP

## 2022-09-13 NOTE — Addendum Note (Signed)
Addended by: Durwin Nora on: 09/13/2022 12:29 PM   Modules accepted: Orders

## 2022-09-13 NOTE — Telephone Encounter (Signed)
Will forward to provider  

## 2022-09-13 NOTE — Telephone Encounter (Signed)
[facility] [address] [place] [address]  Phone: [phone] Fax: [phone]    Pt called requesting refills for these two medications, please advise. Pt says she does not want to relapse with alcoholism.

## 2022-09-15 NOTE — Telephone Encounter (Signed)
Pt called and scheduled the next available appt, she wants a refill to last her unitl her scheduled time. She wants a call back from the office today!

## 2022-09-15 NOTE — Telephone Encounter (Signed)
*  disregard the excalation

## 2022-09-16 NOTE — Telephone Encounter (Signed)
Are you going to refill medication for pt

## 2022-09-16 NOTE — Telephone Encounter (Signed)
Patient would like PCP to refill her medications to hold her over until her 09/29/2022 appointment. Patients states she does not want to relapse and start drinking alcohol again  Patient has been placed on the wait list and prefers morning only.

## 2022-09-17 NOTE — Telephone Encounter (Signed)
Pt. Reports she completed detox in July. She is "just having cravings and the medication helps me." Please advise pt.

## 2022-09-17 NOTE — Telephone Encounter (Signed)
Returned pt call and made pt aware that per Sharyn Lull she will not refill medication

## 2022-09-17 NOTE — Telephone Encounter (Signed)
Returned pt call and made aware of provider response pt doesn't have any questions or concerns

## 2022-09-20 NOTE — Telephone Encounter (Signed)
noted 

## 2022-09-29 ENCOUNTER — Ambulatory Visit (INDEPENDENT_AMBULATORY_CARE_PROVIDER_SITE_OTHER): Payer: Self-pay | Admitting: Primary Care

## 2022-09-30 ENCOUNTER — Other Ambulatory Visit: Payer: Self-pay

## 2022-10-04 ENCOUNTER — Other Ambulatory Visit: Payer: Self-pay

## 2022-10-04 MED ORDER — TRAZODONE HCL 50 MG PO TABS
50.0000 mg | ORAL_TABLET | Freq: Every evening | ORAL | 0 refills | Status: DC | PRN
  Filled 2022-10-04: qty 15, 15d supply, fill #0

## 2022-10-04 MED ORDER — AMLODIPINE BESYLATE 10 MG PO TABS
10.0000 mg | ORAL_TABLET | Freq: Every day | ORAL | 0 refills | Status: DC
  Filled 2022-10-04: qty 15, 15d supply, fill #0

## 2022-10-04 MED ORDER — HYDROXYZINE PAMOATE 25 MG PO CAPS
25.0000 mg | ORAL_CAPSULE | Freq: Four times a day (QID) | ORAL | 0 refills | Status: DC | PRN
  Filled 2022-10-04: qty 28, 7d supply, fill #0

## 2022-10-04 MED ORDER — SERTRALINE HCL 50 MG PO TABS
50.0000 mg | ORAL_TABLET | Freq: Every day | ORAL | 0 refills | Status: DC
  Filled 2022-10-04: qty 15, 15d supply, fill #0

## 2022-10-04 MED ORDER — ACAMPROSATE CALCIUM 333 MG PO TBEC
666.0000 mg | DELAYED_RELEASE_TABLET | Freq: Three times a day (TID) | ORAL | 0 refills | Status: DC
  Filled 2022-10-04: qty 90, 15d supply, fill #0

## 2022-10-05 ENCOUNTER — Other Ambulatory Visit: Payer: Self-pay

## 2022-10-06 ENCOUNTER — Other Ambulatory Visit: Payer: Self-pay

## 2022-12-01 ENCOUNTER — Other Ambulatory Visit: Payer: Self-pay

## 2022-12-01 ENCOUNTER — Ambulatory Visit (HOSPITAL_COMMUNITY): Payer: No Typology Code available for payment source

## 2022-12-01 ENCOUNTER — Ambulatory Visit (HOSPITAL_COMMUNITY)
Admission: EM | Admit: 2022-12-01 | Discharge: 2022-12-01 | Disposition: A | Discharge status: Home / Self Care | Payer: No Typology Code available for payment source | Attending: Emergency Medicine | Admitting: Emergency Medicine

## 2022-12-01 ENCOUNTER — Encounter (HOSPITAL_COMMUNITY): Payer: Self-pay | Admitting: Emergency Medicine

## 2022-12-01 DIAGNOSIS — M25511 Pain in right shoulder: Secondary | ICD-10-CM

## 2022-12-01 HISTORY — DX: Anxiety disorder, unspecified: F41.9

## 2022-12-01 MED ORDER — METHOCARBAMOL 500 MG PO TABS
500.0000 mg | ORAL_TABLET | Freq: Two times a day (BID) | ORAL | 0 refills | Status: DC
  Filled 2022-12-01: qty 20, 10d supply, fill #0

## 2022-12-01 MED ORDER — KETOROLAC TROMETHAMINE 30 MG/ML IJ SOLN
30.0000 mg | Freq: Once | INTRAMUSCULAR | Status: AC
  Administered 2022-12-01: 30 mg via INTRAMUSCULAR

## 2022-12-01 MED ORDER — KETOROLAC TROMETHAMINE 30 MG/ML IJ SOLN
INTRAMUSCULAR | Status: AC
  Filled 2022-12-01: qty 1

## 2022-12-01 NOTE — ED Provider Notes (Signed)
[facility]    CSN: [id number] Arrival date & time: 12/01/22  1230      History   Chief Complaint Chief Complaint  Patient presents with   Shoulder Pain    HPI Regina [name] is a 57 y.o. female.   Patient presents to clinic for complaints of right shoulder pain over the past 2 days.  Her arm has been hurting so bad that she can hardly lift her right arm, pain has been radiating to her neck.  She denies any falls, trauma, heavy lifting or pulling.  She just woke up with her shoulder hurting.  She denies any chest pain or shortness of breath, denies recent illness.  Reports she is clean from alcohol.     The history is provided by the patient.  Shoulder Pain Associated symptoms: neck pain   Associated symptoms: no fatigue and no fever     Past Medical History:  Diagnosis Date   Anxiety    Hypertension     Patient Active Problem List   Diagnosis Date Noted   Hyponatremia 04/16/2018   Depression 04/16/2018   Hypertensive urgency    HTN (hypertension) 01/13/2016   Right anterior shoulder pain 07/18/2014   Postconcussion syndrome 05/28/2014    Past Surgical History:  Procedure Laterality Date   dental procedure      OB History   No obstetric history on file.      Home Medications    Prior to Admission medications   Medication Sig Start Date End Date Taking? Authorizing Provider  methocarbamol (ROBAXIN) 500 MG tablet Take 1 tablet (500 mg total) by mouth 2 (two) times daily. 12/01/22  Yes Louretta Shorten, [place] N, FNP  acamprosate (CAMPRAL) 333 MG tablet Take 2 tablets (666 mg total) by mouth 3 (three) times daily. 10/04/22     albuterol (VENTOLIN HFA) 108 (90 Base) MCG/ACT inhaler INHALE 2 PUFFS INTO THE LUNGS EVERY 6 (SIX) HOURS AS NEEDED FOR WHEEZING OR SHORTNESS OF BREATH. Patient not taking: Reported on 12/01/2022 06/27/22 06/27/23  Kerin Perna, NP  amLODipine (NORVASC) 10 MG tablet Take 1 tablet (10 mg total) by mouth daily. 04/19/22    Kerin Perna, NP  amLODipine (NORVASC) 10 MG tablet Take 1 tablet (10 mg total) by mouth daily. 10/04/22     aspirin EC 81 MG tablet Take 81 mg by mouth daily. Patient not taking: Reported on 12/01/2022    [provider]  aspirin EC 81 MG tablet Take 1 tablet (81 mg total) by mouth daily. Patient not taking: Reported on 12/01/2022 11/06/21     carvedilol (COREG) 3.125 MG tablet TAKE 1 TABLET (3.125 MG TOTAL) BY MOUTH 2 (TWO) TIMES DAILY WITH A MEAL. 04/19/22 04/19/23  Kerin Perna, NP  Cholecalciferol (VITAMIN D3) 125 MCG (5000 UT) TABS Take 1 tab ONCE A DAY for vitamin insufficiency 03/25/22     fluticasone (FLONASE) 50 MCG/ACT nasal spray Place 2 sprays into both nostrils daily. Patient taking differently: Place 2 sprays into both nostrils daily as needed for allergies. 12/24/20   Kerin Perna, NP  gabapentin (NEURONTIN) 300 MG capsule Take 1 capsule (300 mg total) by mouth 3 (three) times daily. Patient not taking: Reported on 12/01/2022 03/25/22     hydrochlorothiazide (HYDRODIURIL) 25 MG tablet Take 1 tablet (25 mg total) by mouth daily. 04/19/22   Kerin Perna, NP  hydrOXYzine (VISTARIL) 25 MG capsule Take 1 capsule (25 mg total) by mouth every 6 (six) hours as needed. Patient not  taking: Reported on 12/01/2022 10/04/22     ibuprofen (ADVIL) 200 MG tablet Take 400 mg by mouth every 6 (six) hours as needed for headache.    [provider]  loratadine (CLARITIN) 10 MG tablet Take 1 tablet (10 mg total) by mouth daily. Patient taking differently: Take 10 mg by mouth daily as needed for allergies. 12/24/20   Kerin Perna, NP  magnesium oxide (MAG-OX) 400 MG tablet Take 400 mg by mouth daily. Patient not taking: Reported on 12/01/2022 09/24/19   [provider]  metroNIDAZOLE (FLAGYL) 500 MG tablet Take 1 tablet (500 mg total) by mouth 2 (two) times daily. Patient not taking: Reported on 12/01/2022 07/14/22   Kerin Perna, NP  naltrexone  (DEPADE) 50 MG tablet Take 1 tablet (50 mg total) by mouth daily. for Alcohol dependence ,uncomplicated [id number]     sertraline (ZOLOFT) 50 MG tablet Take 1 tablet (50 mg total) by mouth daily for 30 days. Patient not taking: Reported on 12/01/2022 04/02/21     sertraline (ZOLOFT) 50 MG tablet Take 1 tablet (50 mg total) by mouth daily. Patient not taking: Reported on 12/01/2022 10/04/22     traZODone (DESYREL) 50 MG tablet Take 1 tablet (50 mg total) by mouth at bedtime as needed. Patient not taking: Reported on 12/01/2022 10/04/22       Family History Family History  Problem Relation Age of Onset   Leukemia Mother    Hypertension Mother    Hypertension Father    Cancer Neg Hx    Diabetes Neg Hx    Heart disease Neg Hx     Social History Social History   Tobacco Use   Smoking status: Former    Types: Cigarettes    Quit date: 10/31/2019    Years since quitting: 3.0   Smokeless tobacco: Never  Vaping Use   Vaping Use: Never used  Substance Use Topics   Alcohol use: Not Currently   Drug use: No     Allergies   Bee venom   Review of Systems Review of Systems  Constitutional:  Negative for fatigue and fever.  HENT:  Negative for sore throat.   Respiratory:  Negative for cough and shortness of breath.   Cardiovascular:  Negative for chest pain.  Gastrointestinal:  Negative for abdominal pain.  Genitourinary:  Negative for dysuria.  Musculoskeletal:  Positive for arthralgias, neck pain and neck stiffness.  Neurological:  Negative for headaches.     Physical Exam Triage Vital Signs ED Triage Vitals  Enc Vitals Group     BP 12/01/22 1331 (!) 143/82     Pulse Rate 12/01/22 1331 89     Resp 12/01/22 1331 20     Temp 12/01/22 1331 98.3 F (36.8 C)     Temp Source 12/01/22 1331 Oral     SpO2 12/01/22 1331 94 %     Weight --      Height --      Head Circumference --      Peak Flow --      Pain Score 12/01/22 1326 8     Pain Loc --      Pain Edu? --      Excl. in [place]?  --    No data found.  Updated Vital Signs BP (!) 143/82 (BP Location: Left Arm)   Pulse 89   Temp 98.3 F (36.8 C) (Oral)   Resp 20   LMP 06/06/2010   SpO2 94%   Visual Acuity  Right Eye Distance:   Left Eye Distance:   Bilateral Distance:    Right Eye Near:   Left Eye Near:    Bilateral Near:     Physical Exam Vitals and nursing note reviewed.  Constitutional:      Appearance: Normal appearance.  HENT:     Head: Normocephalic and atraumatic.     Right Ear: External ear normal.     Left Ear: External ear normal.     Nose: Nose normal.     Mouth/Throat:     Mouth: Mucous membranes are moist.  Neck:     Vascular: No carotid bruit.  Cardiovascular:     Rate and Rhythm: Normal rate and regular rhythm.     Heart sounds: Normal heart sounds, S1 normal and S2 normal. No murmur heard. Pulmonary:     Effort: Pulmonary effort is normal. No respiratory distress.     Comments: Lungs vesicular posteriorly. Musculoskeletal:        General: Tenderness present. No swelling, deformity or signs of injury.     Right shoulder: Tenderness and bony tenderness present. No swelling, deformity, laceration or crepitus. Decreased range of motion. Normal strength. Normal pulse.     Cervical back: Normal range of motion. No rigidity or tenderness.     Right lower leg: No edema.     Left lower leg: No edema.     Comments: Limited range of motion in right shoulder, patient unable to lift shoulder past 45 degrees due to pain, does not let examiner lift shoulder for passive ROM either. Without deformity or obvious dislocation to palpation.   Lymphadenopathy:     Cervical: No cervical adenopathy.  Skin:    General: Skin is dry.     Capillary Refill: Capillary refill takes less than 2 seconds.  Neurological:     Mental Status: She is alert and oriented to person, place, and time.  Psychiatric:        Mood and Affect: Mood normal.        Behavior: Behavior is cooperative.      UC Treatments  / Results  Labs (all labs ordered are listed, but only abnormal results are displayed) Labs Reviewed - No data to display  EKG   Radiology DG Shoulder Right  Result Date: 12/01/2022 CLINICAL DATA:  pain, limitation in ROM EXAM: RIGHT SHOULDER - 2+ VIEW COMPARISON:  Shoulder radiograph 04/17/2007, chest radiograph 01/11/2021. FINDINGS: Large field-of-view limits evaluation. There is no evidence of acute fracture or dislocation. 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 There are reticular opacities in the lungs, basilar predominant suggesting pulmonary fibrosis. IMPRESSION: No acute osseous abnormality.  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 Electronically Signed   By: Maurine Simmering M.D.   On: 12/01/2022 14:14    Procedures Procedures (including critical care time)  Medications Ordered in UC Medications  ketorolac (TORADOL) 30 MG/ML injection 30 mg (30 mg Intramuscular Given 12/01/22 1436)    Initial Impression / Assessment and Plan / UC Course  I have reviewed the triage vital signs and the nursing notes.  Pertinent labs & imaging results that were available during my care of the patient were reviewed by me and considered in my medical decision making (see chart for details).  Vitals in triage reviewed, patient is hemodynamically stable.  Unclear musculoskeletal etiology to right shoulder pain, suspect rotator cuff involvement. Right shoulder imaging with mild glenohumeral osteoarthritis, without dislocation or fracture.  Will trial ketorolac injection  in clinic and muscle relaxers, advised to follow-up with orthopedic if problem persist.  Patient verbalized understanding, no questions at this time.  Return and follow-up precautions discussed.    Final Clinical Impressions(s) / UC Diagnoses   Final diagnoses:  Acute pain of right shoulder     Discharge Instructions      Your x-ray was negative for  fracture or dislocation.  I suspect your shoulder pain is musculoskeletal in nature, please take the muscle relaxers up to 2 times daily as needed for muscle aches and pains.  Please do not drink or drive on this medication as it may make you drowsy.  We have given you a shot of Toradol in clinic, do not take any more NSAIDs like ibuprofen, naproxen or Mobic for the rest of the day.  Starting tomorrow you can alternate between Tylenol and ibuprofen as needed for aches and pains.  You can do ice or warm compress to your shoulder.  If your pain persist, please follow-up with [facility] for further evaluation.  Please return to clinic or seek immediate care if you develop worsening of pain, chest pain, shortness of breath or any changes.      ED Prescriptions     Medication Sig Dispense Auth. Provider   methocarbamol (ROBAXIN) 500 MG tablet Take 1 tablet (500 mg total) by mouth 2 (two) times daily. 20 tablet Moo Gravley, [place], [place]      PDMP not reviewed this encounter.   Avrey Flanagin, [place] N, [place] 12/01/22 [phone]

## 2022-12-01 NOTE — ED Triage Notes (Signed)
Reports 2 days ago waking with right shoulder pain, right neck and right side of head pain.  Pain in shoulder limits raising arm above her head.  No known injury.  Patient is right handed.     Patient has taking tylenol and motrin.

## 2022-12-01 NOTE — Discharge Instructions (Addendum)
Your x-ray was negative for fracture or dislocation.  I suspect your shoulder pain is musculoskeletal in nature, please take the muscle relaxers up to 2 times daily as needed for muscle aches and pains.  Please do not drink or drive on this medication as it may make you drowsy.  We have given you a shot of Toradol in clinic, do not take any more NSAIDs like ibuprofen, naproxen or Mobic for the rest of the day.  Starting tomorrow you can alternate between Tylenol and ibuprofen as needed for aches and pains.  You can do ice or warm compress to your shoulder.  If your pain persist, please follow-up with [facility] for further evaluation.  Please return to clinic or seek immediate care if you develop worsening of pain, chest pain, shortness of breath or any changes.

## 2022-12-01 NOTE — ED Triage Notes (Signed)
Patient was in a detox program earlier this year.  Discharged January 25.  On discharge, blood pressure medicines were the only medicines prescribed.    Patient reports michelle edwards is her pcp

## 2022-12-27 ENCOUNTER — Other Ambulatory Visit (HOSPITAL_BASED_OUTPATIENT_CLINIC_OR_DEPARTMENT_OTHER): Payer: Self-pay

## 2022-12-29 ENCOUNTER — Other Ambulatory Visit: Payer: Self-pay

## 2022-12-29 ENCOUNTER — Other Ambulatory Visit (HOSPITAL_COMMUNITY): Payer: Self-pay

## 2022-12-29 ENCOUNTER — Other Ambulatory Visit (HOSPITAL_BASED_OUTPATIENT_CLINIC_OR_DEPARTMENT_OTHER): Payer: Self-pay

## 2022-12-31 ENCOUNTER — Other Ambulatory Visit: Payer: Self-pay

## 2023-05-25 DIAGNOSIS — Z635 Disruption of family by separation and divorce: Secondary | ICD-10-CM | POA: Diagnosis not present

## 2023-05-25 DIAGNOSIS — Y900 Blood alcohol level of less than 20 mg/100 ml: Secondary | ICD-10-CM | POA: Diagnosis not present

## 2023-05-25 DIAGNOSIS — F10988 Alcohol use, unspecified with other alcohol-induced disorder: Secondary | ICD-10-CM | POA: Diagnosis not present

## 2023-05-25 DIAGNOSIS — R1031 Right lower quadrant pain: Secondary | ICD-10-CM | POA: Diagnosis not present

## 2023-05-25 DIAGNOSIS — J449 Chronic obstructive pulmonary disease, unspecified: Secondary | ICD-10-CM | POA: Diagnosis not present

## 2023-05-25 DIAGNOSIS — I10 Essential (primary) hypertension: Secondary | ICD-10-CM | POA: Diagnosis not present

## 2023-05-25 DIAGNOSIS — F10239 Alcohol dependence with withdrawal, unspecified: Secondary | ICD-10-CM | POA: Diagnosis not present

## 2023-05-25 DIAGNOSIS — F1020 Alcohol dependence, uncomplicated: Secondary | ICD-10-CM | POA: Diagnosis not present

## 2023-05-25 DIAGNOSIS — Z79899 Other long term (current) drug therapy: Secondary | ICD-10-CM | POA: Diagnosis not present

## 2023-05-25 DIAGNOSIS — Z888 Allergy status to other drugs, medicaments and biological substances status: Secondary | ICD-10-CM | POA: Diagnosis not present

## 2023-05-25 DIAGNOSIS — F10939 Alcohol use, unspecified with withdrawal, unspecified: Secondary | ICD-10-CM | POA: Diagnosis not present

## 2023-05-25 DIAGNOSIS — F10130 Alcohol abuse with withdrawal, uncomplicated: Secondary | ICD-10-CM | POA: Diagnosis not present

## 2023-05-25 DIAGNOSIS — R058 Other specified cough: Secondary | ICD-10-CM | POA: Diagnosis not present

## 2023-05-25 DIAGNOSIS — J439 Emphysema, unspecified: Secondary | ICD-10-CM | POA: Diagnosis not present

## 2023-05-25 DIAGNOSIS — F1024 Alcohol dependence with alcohol-induced mood disorder: Secondary | ICD-10-CM | POA: Diagnosis not present

## 2023-05-25 DIAGNOSIS — Z20822 Contact with and (suspected) exposure to covid-19: Secondary | ICD-10-CM | POA: Diagnosis not present

## 2023-05-25 DIAGNOSIS — E871 Hypo-osmolality and hyponatremia: Secondary | ICD-10-CM | POA: Diagnosis not present

## 2023-05-25 DIAGNOSIS — D259 Leiomyoma of uterus, unspecified: Secondary | ICD-10-CM | POA: Diagnosis not present

## 2023-05-25 DIAGNOSIS — Z7982 Long term (current) use of aspirin: Secondary | ICD-10-CM | POA: Diagnosis not present

## 2023-05-28 DIAGNOSIS — F10939 Alcohol use, unspecified with withdrawal, unspecified: Secondary | ICD-10-CM | POA: Diagnosis not present

## 2023-05-30 ENCOUNTER — Other Ambulatory Visit: Payer: Self-pay

## 2023-05-30 MED ORDER — THIAMINE HCL 100 MG PO TABS
100.0000 mg | ORAL_TABLET | Freq: Every day | ORAL | 0 refills | Status: DC
Start: 2023-05-28 — End: 2023-09-20
  Filled 2023-05-30: qty 100, 100d supply, fill #0

## 2023-05-30 MED ORDER — FOLIC ACID 1 MG PO TABS
1.0000 mg | ORAL_TABLET | Freq: Every day | ORAL | 0 refills | Status: DC
  Filled 2023-05-30: qty 30, 30d supply, fill #0

## 2023-05-30 MED ORDER — MULTI-VITAMIN PO TABS
1.0000 | ORAL_TABLET | Freq: Every day | ORAL | 0 refills | Status: DC
Start: 2023-05-28 — End: 2023-09-20
  Filled 2023-05-30: qty 100, 100d supply, fill #0

## 2023-05-30 MED ORDER — SODIUM CHLORIDE 1 G PO TABS
ORAL_TABLET | ORAL | 0 refills | Status: DC
Start: 2023-05-28 — End: 2023-09-20
  Filled 2023-05-30: qty 90, 30d supply, fill #0

## 2023-05-31 ENCOUNTER — Other Ambulatory Visit: Payer: Self-pay

## 2023-06-02 ENCOUNTER — Other Ambulatory Visit: Payer: Self-pay

## 2023-06-08 ENCOUNTER — Encounter (INDEPENDENT_AMBULATORY_CARE_PROVIDER_SITE_OTHER): Payer: Self-pay | Admitting: Primary Care

## 2023-06-08 ENCOUNTER — Ambulatory Visit (INDEPENDENT_AMBULATORY_CARE_PROVIDER_SITE_OTHER): Payer: 59 | Admitting: Primary Care

## 2023-06-08 ENCOUNTER — Other Ambulatory Visit: Payer: Self-pay

## 2023-06-08 VITALS — BP 134/85 | HR 89 | Resp 16 | Wt 110.2 lb

## 2023-06-08 DIAGNOSIS — J0100 Acute maxillary sinusitis, unspecified: Secondary | ICD-10-CM

## 2023-06-08 DIAGNOSIS — Z1231 Encounter for screening mammogram for malignant neoplasm of breast: Secondary | ICD-10-CM

## 2023-06-08 DIAGNOSIS — Z2821 Immunization not carried out because of patient refusal: Secondary | ICD-10-CM | POA: Diagnosis not present

## 2023-06-08 DIAGNOSIS — Z1211 Encounter for screening for malignant neoplasm of colon: Secondary | ICD-10-CM | POA: Diagnosis not present

## 2023-06-08 MED ORDER — ALBUTEROL SULFATE HFA 108 (90 BASE) MCG/ACT IN AERS
2.0000 | INHALATION_SPRAY | Freq: Four times a day (QID) | RESPIRATORY_TRACT | 1 refills | Status: DC | PRN
  Filled 2023-06-08: qty 18, 25d supply, fill #0

## 2023-06-08 MED ORDER — FLUCONAZOLE 150 MG PO TABS
150.0000 mg | ORAL_TABLET | Freq: Every day | ORAL | 1 refills | Status: DC
Start: 2023-06-08 — End: 2023-07-26
  Filled 2023-06-08: qty 1, 1d supply, fill #0

## 2023-06-08 MED ORDER — AMOXICILLIN-POT CLAVULANATE 875-125 MG PO TABS
1.0000 | ORAL_TABLET | Freq: Two times a day (BID) | ORAL | 0 refills | Status: DC
Start: 2023-06-08 — End: 2023-07-26
  Filled 2023-06-08: qty 10, 5d supply, fill #0

## 2023-06-08 NOTE — Progress Notes (Signed)
[facility]   Subjective:   [name] is a 57 y.o. female presents for an acute visits she c/o nasal congestion, cough at night with green sputum, eyes are puffy , itchy ,watery and headache..  Past Medical History:  Diagnosis Date   Anxiety    Hypertension      Allergies  Allergen Reactions   Bee Venom Hives      Current Outpatient Medications on File Prior to Visit  Medication Sig Dispense Refill   amLODipine (NORVASC) 10 MG tablet Take 1 tablet (10 mg total) by mouth daily. 90 tablet 1   aspirin EC 81 MG tablet Take 81 mg by mouth daily.     Cholecalciferol (VITAMIN D3) 125 MCG (5000 UT) TABS Take 1 tab ONCE A DAY for vitamin insufficiency 15 tablet 0   folic acid (FOLVITE) 1 MG tablet Take 1 tablet (1 mg total) by mouth daily. 90 tablet 0   hydrochlorothiazide (HYDRODIURIL) 25 MG tablet Take 1 tablet (25 mg total) by mouth daily. 90 tablet 1   ibuprofen (ADVIL) 200 MG tablet Take 400 mg by mouth every 6 (six) hours as needed for headache.     loratadine (CLARITIN) 10 MG tablet Take 1 tablet (10 mg total) by mouth daily. (Patient taking differently: Take 10 mg by mouth daily as needed for allergies.) 30 tablet 11   methocarbamol (ROBAXIN) 500 MG tablet Take 1 tablet (500 mg total) by mouth 2 (two) times daily. 20 tablet 0   Multiple Vitamin (MULTI-VITAMIN) tablet Take 1 tablet by mouth daily. 100 tablet 0   sodium chloride 1 g tablet Take 1 tablet (1 g total) by mouth in the morning and 1 tablet (1 g total) at noon and 1 tablet (1 g total) in the evening. Take with meals. 270 tablet 0   thiamine (VITAMIN B1) 100 MG tablet Take 1 tablet (100 mg total) by mouth daily. 100 tablet 0   traZODone (DESYREL) 50 MG tablet Take 1 tablet (50 mg total) by mouth at bedtime as needed. 15 tablet 0   acamprosate (CAMPRAL) 333 MG tablet Take 2 tablets (666 mg total) by mouth 3 (three) times daily. 90 tablet 0   albuterol (VENTOLIN HFA) 108 (90 Base) MCG/ACT inhaler INHALE  2 PUFFS INTO THE LUNGS EVERY 6 (SIX) HOURS AS NEEDED FOR WHEEZING OR SHORTNESS OF BREATH. (Patient not taking: Reported on 12/01/2022) 6.7 g 1   amLODipine (NORVASC) 10 MG tablet Take 1 tablet (10 mg total) by mouth daily. 15 tablet 0   aspirin EC 81 MG tablet Take 1 tablet (81 mg total) by mouth daily. (Patient not taking: Reported on 12/01/2022) 30 tablet 0   carvedilol (COREG) 3.125 MG tablet TAKE 1 TABLET (3.125 MG TOTAL) BY MOUTH 2 (TWO) TIMES DAILY WITH A MEAL. 180 tablet 1   fluticasone (FLONASE) 50 MCG/ACT nasal spray Place 2 sprays into both nostrils daily. (Patient taking differently: Place 2 sprays into both nostrils daily as needed for allergies.) 16 g 6   hydrOXYzine (VISTARIL) 25 MG capsule Take 1 capsule (25 mg total) by mouth every 6 (six) hours as needed. (Patient not taking: Reported on 12/01/2022) 28 capsule 0   magnesium oxide (MAG-OX) 400 MG tablet Take 400 mg by mouth daily. (Patient not taking: Reported on 12/01/2022)     naltrexone (DEPADE) 50 MG tablet Take 1 tablet (50 mg total) by mouth daily. for Alcohol dependence ,uncomplicated (Patient not taking: Reported on 06/08/2023) 15 tablet 0   sertraline (ZOLOFT) 50  MG tablet Take 1 tablet (50 mg total) by mouth daily for 30 days. (Patient not taking: Reported on 12/01/2022) 30 tablet 0   sertraline (ZOLOFT) 50 MG tablet Take 1 tablet (50 mg total) by mouth daily. (Patient not taking: Reported on 12/01/2022) 15 tablet 0   No current facility-administered medications on file prior to visit.     Review of System: ROS  Objective:  LMP 06/06/2010   There were no vitals filed for this visit.  Physical Exam:   General Appearance: Well nourished, in no apparent distress. Eyes: PERRLA, EOMs, conjunctiva no swelling or erythema Sinuses: Frontal/maxillary tenderness ENT/Mouth: Nasal- boggy, red  swollen Ext aud canals clear, TMs without erythema, bulging.  Hearing normal.  Neck: Supple, thyroid normal.  Respiratory: Respiratory  effort normal, BS equal bilaterally without rales, rhonchi, wheezing or stridor.  Cardio: RRR with no MRGs. Brisk peripheral pulses without edema.  Abdomen: Soft, + BS.  Non tender, no guarding, rebound, hernias, masses. Lymphatics: Non tender without lymphadenopathy.  Musculoskeletal: Full ROM, 5/5 strength, normal gait.  Skin: Warm, dry without rashes, lesions, ecchymosis.  Neuro: Cranial nerves intact. Normal muscle tone, no cerebellar symptoms. Sensation intact.  Psych: Awake and oriented X 3, normal affect, Insight and Judgment appropriate.    Assessment:  Regina [name] was seen today for hypertension and cough.  Diagnoses and all orders for this visit:  Influenza vaccination declined  Acute maxillary sinusitis, recurrence not specified -     amoxicillin-clavulanate (AUGMENTIN) 875-125 MG tablet; Take 1 tablet by mouth 2 (two) times daily. -     fluconazole (DIFLUCAN) 150 MG tablet; Take 1 tablet (150 mg total) by mouth daily.  Colon cancer screening -     Ambulatory referral to Gastroenterology  Encounter for screening mammogram for malignant neoplasm of breast -     MM DIGITAL SCREENING BILATERAL; Future    This note has been created with Education officer, environmental. Any transcriptional errors are unintentional.   [name], NP 06/08/2023, 10:21 AM

## 2023-06-10 ENCOUNTER — Other Ambulatory Visit: Payer: Self-pay

## 2023-06-13 ENCOUNTER — Telehealth: Payer: Self-pay

## 2023-06-13 NOTE — Telephone Encounter (Signed)
Copied from CRM [id number]. Topic: General - Other >> Jun 13, 2023  3:35 PM Ja-Kwan M wrote: Reason for CRM: Pt stated she was suppose to get Rx for allergy medication and eye drops but when she contacted the pharmacy she was told that they had not received the prescriptions.

## 2023-06-14 ENCOUNTER — Other Ambulatory Visit: Payer: Self-pay

## 2023-06-14 ENCOUNTER — Other Ambulatory Visit (INDEPENDENT_AMBULATORY_CARE_PROVIDER_SITE_OTHER): Payer: Self-pay | Admitting: Primary Care

## 2023-06-14 MED ORDER — LORATADINE 10 MG PO TABS
10.0000 mg | ORAL_TABLET | Freq: Every day | ORAL | 2 refills | Status: DC
Start: 2023-06-14 — End: 2023-08-09
  Filled 2023-06-14: qty 30, 30d supply, fill #0

## 2023-06-15 NOTE — Telephone Encounter (Signed)
Returned pt call and made aware of provider response pt is aware and doesn't have any questions or concerns

## 2023-06-23 ENCOUNTER — Ambulatory Visit
Admission: RE | Admit: 2023-06-23 | Discharge: 2023-06-23 | Disposition: A | Discharge status: Home / Self Care | Payer: 59 | Source: Ambulatory Visit | Attending: Primary Care | Admitting: Primary Care

## 2023-06-23 DIAGNOSIS — Z1231 Encounter for screening mammogram for malignant neoplasm of breast: Secondary | ICD-10-CM | POA: Diagnosis not present

## 2023-07-26 ENCOUNTER — Other Ambulatory Visit: Payer: Self-pay

## 2023-07-26 ENCOUNTER — Ambulatory Visit (AMBULATORY_SURGERY_CENTER): Payer: 59

## 2023-07-26 VITALS — Ht 66.0 in | Wt 112.0 lb

## 2023-07-26 DIAGNOSIS — Z1211 Encounter for screening for malignant neoplasm of colon: Secondary | ICD-10-CM

## 2023-07-26 MED ORDER — NA SULFATE-K SULFATE-MG SULF 17.5-3.13-1.6 GM/177ML PO SOLN
1.0000 | Freq: Once | ORAL | 0 refills | Status: AC
  Filled 2023-07-26: qty 354, 2d supply, fill #0

## 2023-07-26 MED ORDER — ONDANSETRON HCL 4 MG PO TABS
4.0000 mg | ORAL_TABLET | Freq: Three times a day (TID) | ORAL | 1 refills | Status: DC | PRN
  Filled 2023-07-26: qty 18, 6d supply, fill #0

## 2023-07-26 NOTE — Progress Notes (Signed)

## 2023-08-05 ENCOUNTER — Other Ambulatory Visit: Payer: Self-pay

## 2023-08-09 ENCOUNTER — Encounter (HOSPITAL_COMMUNITY): Payer: Self-pay

## 2023-08-09 ENCOUNTER — Other Ambulatory Visit: Payer: Self-pay

## 2023-08-09 ENCOUNTER — Ambulatory Visit (HOSPITAL_COMMUNITY)
Admission: EM | Admit: 2023-08-09 | Discharge: 2023-08-09 | Disposition: A | Discharge status: Home / Self Care | Payer: 59 | Attending: Internal Medicine | Admitting: Internal Medicine

## 2023-08-09 DIAGNOSIS — S46911A Strain of unspecified muscle, fascia and tendon at shoulder and upper arm level, right arm, initial encounter: Secondary | ICD-10-CM

## 2023-08-09 MED ORDER — IBUPROFEN 600 MG PO TABS
600.0000 mg | ORAL_TABLET | Freq: Four times a day (QID) | ORAL | 0 refills | Status: DC | PRN
  Filled 2023-08-09: qty 30, 8d supply, fill #0

## 2023-08-09 MED ORDER — METHOCARBAMOL 500 MG PO TABS
500.0000 mg | ORAL_TABLET | Freq: Every day | ORAL | 0 refills | Status: AC
  Filled 2023-08-09: qty 7, 7d supply, fill #0

## 2023-08-09 NOTE — ED Triage Notes (Signed)
Patient here today with c/o right shoulder pain X 4 days after reaching in the backseat of her car and feeling a pop. Patient states that her pain radiates down her arm and up to her neck.

## 2023-08-09 NOTE — ED Provider Notes (Signed)
[facility]    CSN: [id number] Arrival date & time: 08/09/23  0906      History   Chief Complaint Chief Complaint  Patient presents with   Shoulder Pain    Neck pain    HPI Regina [name] is a 57 y.o. female comes to the urgent care with 4-day history of right shoulder and right upper arm pain.  Pain has worsened over the past 4 days.  Patient was driving and reached out to pick up a bottle of water when the pain started.  She feels that she had a pop.  Pain is currently severe, sharp and throbbing and aggravated by movement of the upper extremity as well as palpation of the right forearm.  No known relieving factors.  She has been taking over-the-counter ibuprofen with no improvement in her symptoms.  She has some muscle stiffness in the right upper arm as well as the right shoulder and right side of the neck.  No falls or trauma.   HPI  Past Medical History:  Diagnosis Date   Allergy    Anxiety    COPD (chronic obstructive pulmonary disease) (HCC)    Emphysema of lung (HCC)    Hypertension     Patient Active Problem List   Diagnosis Date Noted   Hyponatremia 04/16/2018   Depression 04/16/2018   Hypertensive urgency    HTN (hypertension) 01/13/2016   Right anterior shoulder pain 07/18/2014   Postconcussion syndrome 05/28/2014    Past Surgical History:  Procedure Laterality Date   dental procedure      OB History   No obstetric history on file.      Home Medications    Prior to Admission medications   Medication Sig Start Date End Date Taking? Authorizing Provider  ibuprofen (ADVIL) 600 MG tablet Take 1 tablet (600 mg total) by mouth every 6 (six) hours as needed. 08/09/23  Yes Senai Ramnath, Britta Mccreedy, MD  methocarbamol (ROBAXIN) 500 MG tablet Take 1 tablet (500 mg total) by mouth at bedtime for 7 days. 08/09/23 08/16/23 Yes Kristian Hazzard, Britta Mccreedy, MD  albuterol (VENTOLIN HFA) 108 (90 Base) MCG/ACT inhaler INHALE 2 PUFFS INTO THE LUNGS EVERY 6 (SIX) HOURS AS  NEEDED FOR WHEEZING OR SHORTNESS OF BREATH. 06/08/23   [name], NP  amLODipine (NORVASC) 10 MG tablet Take 1 tablet (10 mg total) by mouth daily. 04/19/22   [name], NP  aspirin EC 81 MG tablet Take 81 mg by mouth daily.    [provider]  Cholecalciferol (VITAMIN D3) 125 MCG (5000 UT) TABS Take 1 tab ONCE A DAY for vitamin insufficiency 03/25/22     fluticasone (FLONASE) 50 MCG/ACT nasal spray Place 2 sprays into both nostrils daily. Patient taking differently: Place 2 sprays into both nostrils daily as needed for allergies. 12/24/20   [name], NP  Multiple Vitamin (MULTI-VITAMIN) tablet Take 1 tablet by mouth daily. 05/28/23     sodium chloride 1 g tablet Take 1 tablet (1 g total) by mouth in the morning and 1 tablet (1 g total) at noon and 1 tablet (1 g total) in the evening. Take with meals. 05/28/23     thiamine (VITAMIN B1) 100 MG tablet Take 1 tablet (100 mg total) by mouth daily. 05/28/23     traZODone (DESYREL) 50 MG tablet Take 1 tablet (50 mg total) by mouth at bedtime as needed. 10/04/22       Family History Family History  Problem Relation Age of  Onset   Leukemia Mother    Hypertension Mother    Hypertension Father    Cancer Neg Hx    Diabetes Neg Hx    Heart disease Neg Hx    Colon cancer Neg Hx    Esophageal cancer Neg Hx    Rectal cancer Neg Hx    Stomach cancer Neg Hx     Social History Social History   Tobacco Use   Smoking status: Former    Current packs/day: 0.00    Types: Cigarettes    Quit date: 10/31/2019    Years since quitting: 3.7   Smokeless tobacco: Never  Vaping Use   Vaping status: Never Used  Substance Use Topics   Alcohol use: Yes    Alcohol/week: 6.0 standard drinks of alcohol    Types: 6 Cans of beer per week   Drug use: Yes    Frequency: 4.0 times per week    Types: Marijuana     Allergies   Bee venom   Review of Systems Review of Systems As per HPI  Physical Exam Triage Vital Signs ED  Triage Vitals  Encounter Vitals Group     BP 08/09/23 0938 136/87     Systolic BP Percentile --      Diastolic BP Percentile --      Pulse Rate 08/09/23 0938 93     Resp 08/09/23 0938 16     Temp 08/09/23 0938 99.2 F (37.3 C)     Temp Source 08/09/23 0938 Oral     SpO2 08/09/23 0938 96 %     Weight 08/09/23 0942 115 lb (52.2 kg)     Height 08/09/23 0942 5\' 6"  (1.676 m)     Head Circumference --      Peak Flow --      Pain Score 08/09/23 0940 8     Pain Loc --      Pain Education --      Exclude from Growth Chart --    No data found.  Updated Vital Signs BP 136/87 (BP Location: Left Arm)   Pulse 93   Temp 99.2 F (37.3 C) (Oral)   Resp 16   Ht 5\' 6"  (1.676 m)   Wt 52.2 kg   LMP 06/06/2010   SpO2 96%   BMI 18.56 kg/m   Visual Acuity Right Eye Distance:   Left Eye Distance:   Bilateral Distance:    Right Eye Near:   Left Eye Near:    Bilateral Near:     Physical Exam Vitals and nursing note reviewed.  Constitutional:      General: She is in acute distress.     Appearance: Normal appearance.  Cardiovascular:     Rate and Rhythm: Normal rate and regular rhythm.  Pulmonary:     Effort: Pulmonary effort is normal.     Breath sounds: Normal breath sounds.  Musculoskeletal:     Comments: Full range of motion of the right shoulder with pain.  No bruising of the right shoulder.  Neurological:     General: No focal deficit present.     Mental Status: She is alert and oriented to person, place, and time.      UC Treatments / Results  Labs (all labs ordered are listed, but only abnormal results are displayed) Labs Reviewed - No data to display  EKG   Radiology No results found.  Procedures Procedures (including critical care time)  Medications Ordered in UC Medications - No data to  display  Initial Impression / Assessment and Plan / UC Course  I have reviewed the triage vital signs and the nursing notes.  Pertinent labs & imaging results that were  available during my care of the patient were reviewed by me and considered in my medical decision making (see chart for details).     1.  Strain of the right upper arm: No indication for x-rays Gentle range of motion exercises Ibuprofen 600 mg every 6 hours as needed for pain Robaxin 500 mg at bedtime-medication precautions given Gentle range of motion exercises Heating pad use as needed. Final Clinical Impressions(s) / UC Diagnoses   Final diagnoses:  Strain of right upper arm, initial encounter     Discharge Instructions      Rest and elevate the affected painful area.   Apply heating pad on a 20-minute on-20 minutes of cycle Please take medications as directed Please do not drive or operate heavy machinery after taking muscle relaxant since it will make you drowsy As pain improves, begin normal activities slowly as tolerated Please feel free to return to urgent care if you have worsening symptoms.     ED Prescriptions     Medication Sig Dispense Auth. Provider   ibuprofen (ADVIL) 600 MG tablet Take 1 tablet (600 mg total) by mouth every 6 (six) hours as needed. 30 tablet Keelin Sheridan, Britta Mccreedy, MD   methocarbamol (ROBAXIN) 500 MG tablet Take 1 tablet (500 mg total) by mouth at bedtime for 7 days. 7 tablet Latorria Zeoli, Britta Mccreedy, MD      PDMP not reviewed this encounter.   Merrilee Jansky, MD 08/09/23 1130

## 2023-08-09 NOTE — Discharge Instructions (Signed)
Rest and elevate the affected painful area.   Apply heating pad on a 20-minute on-20 minutes of cycle Please take medications as directed Please do not drive or operate heavy machinery after taking muscle relaxant since it will make you drowsy As pain improves, begin normal activities slowly as tolerated Please feel free to return to urgent care if you have worsening symptoms.

## 2023-08-16 ENCOUNTER — Emergency Department (HOSPITAL_COMMUNITY): Payer: 59

## 2023-08-16 ENCOUNTER — Encounter: Payer: 59 | Admitting: Internal Medicine

## 2023-08-16 ENCOUNTER — Emergency Department (HOSPITAL_COMMUNITY)
Admission: EM | Admit: 2023-08-16 | Discharge: 2023-08-16 | Disposition: A | Discharge status: Home / Self Care | Payer: 59 | Attending: Emergency Medicine | Admitting: Emergency Medicine

## 2023-08-16 ENCOUNTER — Other Ambulatory Visit: Payer: Self-pay

## 2023-08-16 ENCOUNTER — Encounter (HOSPITAL_COMMUNITY): Payer: Self-pay

## 2023-08-16 DIAGNOSIS — M79601 Pain in right arm: Secondary | ICD-10-CM | POA: Insufficient documentation

## 2023-08-16 DIAGNOSIS — M25521 Pain in right elbow: Secondary | ICD-10-CM | POA: Diagnosis not present

## 2023-08-16 DIAGNOSIS — M8580 Other specified disorders of bone density and structure, unspecified site: Secondary | ICD-10-CM | POA: Diagnosis not present

## 2023-08-16 DIAGNOSIS — Z7982 Long term (current) use of aspirin: Secondary | ICD-10-CM | POA: Diagnosis not present

## 2023-08-16 DIAGNOSIS — M62838 Other muscle spasm: Secondary | ICD-10-CM | POA: Insufficient documentation

## 2023-08-16 DIAGNOSIS — M778 Other enthesopathies, not elsewhere classified: Secondary | ICD-10-CM | POA: Diagnosis not present

## 2023-08-16 DIAGNOSIS — J984 Other disorders of lung: Secondary | ICD-10-CM | POA: Diagnosis not present

## 2023-08-16 DIAGNOSIS — I10 Essential (primary) hypertension: Secondary | ICD-10-CM | POA: Diagnosis not present

## 2023-08-16 DIAGNOSIS — Z7951 Long term (current) use of inhaled steroids: Secondary | ICD-10-CM | POA: Insufficient documentation

## 2023-08-16 DIAGNOSIS — Z79899 Other long term (current) drug therapy: Secondary | ICD-10-CM | POA: Diagnosis not present

## 2023-08-16 DIAGNOSIS — M19011 Primary osteoarthritis, right shoulder: Secondary | ICD-10-CM | POA: Diagnosis not present

## 2023-08-16 DIAGNOSIS — J439 Emphysema, unspecified: Secondary | ICD-10-CM | POA: Diagnosis not present

## 2023-08-16 DIAGNOSIS — M25511 Pain in right shoulder: Secondary | ICD-10-CM | POA: Diagnosis not present

## 2023-08-16 MED ORDER — KETOROLAC TROMETHAMINE 15 MG/ML IJ SOLN
15.0000 mg | Freq: Once | INTRAMUSCULAR | Status: AC
Start: 2023-08-16 — End: 2023-08-16
  Administered 2023-08-16: 15 mg via INTRAMUSCULAR
  Filled 2023-08-16: qty 1

## 2023-08-16 NOTE — ED Triage Notes (Addendum)
c/o right shoulder pain that radiates into neck and that is worse with movement x1 week. Pt reports reaching in backseat and hearing a pop prior to the pain occurring.  Seen on 12/3 for same and was prescribed muscle relaxer's w/o relief. Patient also reports using heat w/o relief. Tender on palpitation

## 2023-08-16 NOTE — ED Provider Notes (Signed)
[place] EMERGENCY DEPARTMENT AT [facility] Provider Note   CSN: [id number] Arrival date & time: 08/16/23  1048     History  Chief Complaint  Patient presents with   Shoulder Pain    Regina [name] is a 57 y.o. female with a history of emphysema, hypertension, and anxiety presents the ED today for arm pain.  Patient reports that about a week ago she was reaching in the backseat of her car and heard a pop in her right bicep.  Since then, patient reports pain to the distal right bicep and elbow.  She endorses pain with extension of the elbow.  She was evaluated at urgent care on 12/3 and was given ibuprofen 600 mg and muscle relaxers that did not improve her symptoms.  Denies she denies fevers, warmth to touch, swelling, or erythema of the joint space.  No numbness, weakness, or loss of grip strength.  No complaints or concerns at this time.    Home Medications Prior to Admission medications   Medication Sig Start Date End Date Taking? Authorizing Provider  albuterol (VENTOLIN HFA) 108 (90 Base) MCG/ACT inhaler INHALE 2 PUFFS INTO THE LUNGS EVERY 6 (SIX) HOURS AS NEEDED FOR WHEEZING OR SHORTNESS OF BREATH. 06/08/23   [name], NP  amLODipine (NORVASC) 10 MG tablet Take 1 tablet (10 mg total) by mouth daily. 04/19/22   [name], NP  aspirin EC 81 MG tablet Take 81 mg by mouth daily.    [provider]  Cholecalciferol (VITAMIN D3) 125 MCG (5000 UT) TABS Take 1 tab ONCE A DAY for vitamin insufficiency 03/25/22     fluticasone (FLONASE) 50 MCG/ACT nasal spray Place 2 sprays into both nostrils daily. Patient taking differently: Place 2 sprays into both nostrils daily as needed for allergies. 12/24/20   [name], NP  ibuprofen (ADVIL) 600 MG tablet Take 1 tablet (600 mg total) by mouth every 6 (six) hours as needed. 08/09/23   Lamptey, Britta Mccreedy, MD  methocarbamol (ROBAXIN) 500 MG tablet Take 1 tablet (500 mg total) by mouth at bedtime for 7  days. 08/09/23 08/16/23  Merrilee Jansky, MD  Multiple Vitamin (MULTI-VITAMIN) tablet Take 1 tablet by mouth daily. 05/28/23     sodium chloride 1 g tablet Take 1 tablet (1 g total) by mouth in the morning and 1 tablet (1 g total) at noon and 1 tablet (1 g total) in the evening. Take with meals. 05/28/23     thiamine (VITAMIN B1) 100 MG tablet Take 1 tablet (100 mg total) by mouth daily. 05/28/23     traZODone (DESYREL) 50 MG tablet Take 1 tablet (50 mg total) by mouth at bedtime as needed. 10/04/22         Allergies    Pork allergy and Bee venom    Review of Systems   Review of Systems  Musculoskeletal:        Right arm pain  All other systems reviewed and are negative.   Physical Exam Updated Vital Signs BP (!) 162/87 (BP Location: Left Arm)   Pulse 95   Temp 98 F (36.7 C) (Oral)   Resp 18   Ht 5\' 6"  (1.676 m)   Wt 52.2 kg   LMP 06/06/2010   SpO2 100%   BMI 18.56 kg/m  Physical Exam Vitals and nursing note reviewed.  Constitutional:      General: She is not in acute distress.    Appearance: Normal appearance.  HENT:  Head: Normocephalic and atraumatic.     Mouth/Throat:     Mouth: Mucous membranes are moist.  Eyes:     Conjunctiva/sclera: Conjunctivae normal.     Pupils: Pupils are equal, round, and reactive to light.  Cardiovascular:     Rate and Rhythm: Normal rate and regular rhythm.     Pulses: Normal pulses.     Heart sounds: Normal heart sounds.  Pulmonary:     Effort: Pulmonary effort is normal.     Breath sounds: Normal breath sounds.  Abdominal:     Palpations: Abdomen is soft.     Tenderness: There is no abdominal tenderness.  Musculoskeletal:        General: Tenderness present. Normal range of motion.     Comments: Tenderness to palpation of the right lateral and medial epicondyles as well as bicep of the right arm. No swelling, erythema, or warmth to touch of the joint space. ROM is decreased second to pain. Sensation intact as well as grip  strength.  Skin:    General: Skin is warm and dry.     Findings: No rash.  Neurological:     General: No focal deficit present.     Mental Status: She is alert.  Psychiatric:        Mood and Affect: Mood normal.        Behavior: Behavior normal.    ED Results / Procedures / Treatments   Labs (all labs ordered are listed, but only abnormal results are displayed) Labs Reviewed - No data to display  EKG None  Radiology DG Elbow Complete Right  Result Date: 08/16/2023 CLINICAL DATA:  Arm pain EXAM: RIGHT ELBOW - COMPLETE 4 VIEW COMPARISON:  None Available. FINDINGS: No fracture or dislocation. Preserved joint spaces. Osteopenia. No significant joint effusion on lateral view. IMPRESSION: No acute osseous abnormality. Electronically Signed   By: Karen Kays M.D.   On: 08/16/2023 15:52   DG Shoulder Right  Result Date: 08/16/2023 CLINICAL DATA:  Right shoulder pain EXAM: RIGHT SHOULDER - 2+ VIEW COMPARISON:  12/01/2022 FINDINGS: Early degenerative change in the humeral head. Small spurs from the acromion. No fracture or dislocation. Airspace opacities in the right upper lung slightly more conspicuous than prior exam. Chronic reticular changes at the right lung base. IMPRESSION: 1. No acute findings. 2. Early degenerative change. 3. Right upper lung airspace disease. Electronically Signed   By: Corlis Leak M.D.   On: 08/16/2023 14:01    Procedures Procedures: not indicated.   Medications Ordered in ED Medications  ketorolac (TORADOL) 15 MG/ML injection 15 mg (15 mg Intramuscular Given 08/16/23 1352)    ED Course/ Medical Decision Making/ A&P                                 Medical Decision Making Amount and/or Complexity of Data Reviewed Radiology: ordered.  Risk Prescription drug management.   This patient presents to the ED for concern of right arm pain, this involves an extensive number of treatment options, and is a complaint that carries with it a high risk of  complications and morbidity.   Differential diagnosis includes: EXTR, dislocation, contusion, ligamentous injury, septic arthritis, etc. Low suspicion for septic arthritis - no fever, swelling, warmth to touch. She is able to    Comorbidities  See HPI above   Additional History  Additional history obtained from prior records.   Imaging Studies  I ordered imaging  studies including right shoulder and elbow x-rays  I independently visualized and interpreted imaging which showed:  No acute findings of the right shoulder x-ray.  Early degenerative changes. No acute osseous abnormality noted on the right elbow x-ray.  No significant joint effusion on lateral view. I agree with the radiologist interpretation   Problem List / ED Course / Critical Interventions / Medication Management  Right arm pain x1 week I ordered medications including: Toradol for pain  I have reviewed the patients home medicines and have made adjustments as needed   Social Determinants of Health  Physical activity   Test / Admission - Considered  Patient eloped.  She told the nurse that she needed to pick up her granddaughter from school but she would come back for the elbow imaging results.       Final Clinical Impression(s) / ED Diagnoses Final diagnoses:  Right arm pain  Muscle spasm    Rx / DC Orders ED Discharge Orders     None         Maxwell Marion, PA-C 08/16/23 1557    Royanne Foots, DO 08/21/23 2023

## 2023-08-17 ENCOUNTER — Emergency Department (HOSPITAL_COMMUNITY)
Admission: EM | Admit: 2023-08-17 | Discharge: 2023-08-17 | Disposition: A | Discharge status: Home / Self Care | Payer: 59 | Attending: Emergency Medicine | Admitting: Emergency Medicine

## 2023-08-17 ENCOUNTER — Other Ambulatory Visit: Payer: Self-pay

## 2023-08-17 ENCOUNTER — Encounter (HOSPITAL_COMMUNITY): Payer: Self-pay

## 2023-08-17 DIAGNOSIS — Z79899 Other long term (current) drug therapy: Secondary | ICD-10-CM | POA: Insufficient documentation

## 2023-08-17 DIAGNOSIS — J449 Chronic obstructive pulmonary disease, unspecified: Secondary | ICD-10-CM | POA: Insufficient documentation

## 2023-08-17 DIAGNOSIS — M79601 Pain in right arm: Secondary | ICD-10-CM | POA: Diagnosis not present

## 2023-08-17 DIAGNOSIS — I10 Essential (primary) hypertension: Secondary | ICD-10-CM | POA: Diagnosis not present

## 2023-08-17 DIAGNOSIS — Z87891 Personal history of nicotine dependence: Secondary | ICD-10-CM | POA: Insufficient documentation

## 2023-08-17 DIAGNOSIS — M79621 Pain in right upper arm: Secondary | ICD-10-CM | POA: Insufficient documentation

## 2023-08-17 NOTE — ED Triage Notes (Signed)
Pt arrives via POV. Pt was seen yesterday for right arm pain. Pt states she had to leave before her workup was completed. Pt returns to go over the results of xray. Pt AxOx4.

## 2023-08-17 NOTE — ED Provider Notes (Signed)
[place] EMERGENCY DEPARTMENT AT [facility] Provider Note  CSN: [id number] Arrival date & time: 08/17/23 1030  Chief Complaint(s) No chief complaint on file.  HPI Regina [name] is a 57 y.o. female history of COPD presenting to the emergency department with right arm pain.  She reports that she was reaching in the back of her car and developed pain around a week ago.  Reports she took 600 mg ibuprofen every 6 hours which helps with her pain.  She has also been icing and using Voltaren which helps.  She came to the emergency department yesterday for this and had x-rays but had to leave prior to results.  She returns because she wants to know x-rays show and she is still having pain.   Past Medical History Past Medical History:  Diagnosis Date   Allergy    Anxiety    COPD (chronic obstructive pulmonary disease) (HCC)    Emphysema of lung (HCC)    Hypertension    Patient Active Problem List   Diagnosis Date Noted   Hyponatremia 04/16/2018   Depression 04/16/2018   Hypertensive urgency    HTN (hypertension) 01/13/2016   Right anterior shoulder pain 07/18/2014   Postconcussion syndrome 05/28/2014   Home Medication(s) Prior to Admission medications   Medication Sig Start Date End Date Taking? Authorizing Provider  albuterol (VENTOLIN HFA) 108 (90 Base) MCG/ACT inhaler INHALE 2 PUFFS INTO THE LUNGS EVERY 6 (SIX) HOURS AS NEEDED FOR WHEEZING OR SHORTNESS OF BREATH. 06/08/23   [name], NP  amLODipine (NORVASC) 10 MG tablet Take 1 tablet (10 mg total) by mouth daily. 04/19/22   [name], NP  aspirin EC 81 MG tablet Take 81 mg by mouth daily.    [provider]  Cholecalciferol (VITAMIN D3) 125 MCG (5000 UT) TABS Take 1 tab ONCE A DAY for vitamin insufficiency 03/25/22     fluticasone (FLONASE) 50 MCG/ACT nasal spray Place 2 sprays into both nostrils daily. Patient taking differently: Place 2 sprays into both nostrils daily as needed for  allergies. 12/24/20   [name], NP  ibuprofen (ADVIL) 600 MG tablet Take 1 tablet (600 mg total) by mouth every 6 (six) hours as needed. 08/09/23   Merrilee Jansky, MD  Multiple Vitamin (MULTI-VITAMIN) tablet Take 1 tablet by mouth daily. 05/28/23     sodium chloride 1 g tablet Take 1 tablet (1 g total) by mouth in the morning and 1 tablet (1 g total) at noon and 1 tablet (1 g total) in the evening. Take with meals. 05/28/23     thiamine (VITAMIN B1) 100 MG tablet Take 1 tablet (100 mg total) by mouth daily. 05/28/23     traZODone (DESYREL) 50 MG tablet Take 1 tablet (50 mg total) by mouth at bedtime as needed. 10/04/22  Past Surgical History Past Surgical History:  Procedure Laterality Date   dental procedure     Family History Family History  Problem Relation Age of Onset   Leukemia Mother    Hypertension Mother    Hypertension Father    Cancer Neg Hx    Diabetes Neg Hx    Heart disease Neg Hx    Colon cancer Neg Hx    Esophageal cancer Neg Hx    Rectal cancer Neg Hx    Stomach cancer Neg Hx     Social History Social History   Tobacco Use   Smoking status: Former    Current packs/day: 0.00    Types: Cigarettes    Quit date: 10/31/2019    Years since quitting: 3.7   Smokeless tobacco: Never  Vaping Use   Vaping status: Never Used  Substance Use Topics   Alcohol use: Yes    Alcohol/week: 6.0 standard drinks of alcohol    Types: 6 Cans of beer per week   Drug use: Yes    Frequency: 4.0 times per week    Types: Marijuana   Allergies Pork allergy and Bee venom  Review of Systems Review of Systems  All other systems reviewed and are negative.   Physical Exam Vital Signs  I have reviewed the triage vital signs BP (!) 156/89   Pulse 86   Temp 98.7 F (37.1 C) (Oral)   Resp 17   Ht 5\' 6"  (1.676 m)   Wt 52.2 kg   LMP  06/06/2010   SpO2 96%   BMI 18.56 kg/m  Physical Exam Vitals and nursing note reviewed.  Constitutional:      Appearance: Normal appearance.  HENT:     Head: Normocephalic and atraumatic.     Mouth/Throat:     Mouth: Mucous membranes are moist.  Eyes:     Conjunctiva/sclera: Conjunctivae normal.  Cardiovascular:     Rate and Rhythm: Normal rate.  Pulmonary:     Effort: Pulmonary effort is normal. No respiratory distress.  Abdominal:     General: Abdomen is flat.  Musculoskeletal:        General: No deformity.     Comments: Mild tenderness to the right anterior upper arm.  Flexion/extension intact at the shoulder and elbow.  No swelling to the arm, no rashes or wounds.  Normal peripheral pulse.  Skin:    General: Skin is warm and dry.     Capillary Refill: Capillary refill takes less than 2 seconds.  Neurological:     General: No focal deficit present.     Mental Status: She is alert. Mental status is at baseline.  Psychiatric:        Mood and Affect: Mood normal.        Behavior: Behavior normal.     ED Results and Treatments Labs (all labs ordered are listed, but only abnormal results are displayed) Labs Reviewed - No data to display  Radiology DG Elbow Complete Right  Result Date: 08/16/2023 CLINICAL DATA:  Arm pain EXAM: RIGHT ELBOW - COMPLETE 4 VIEW COMPARISON:  None Available. FINDINGS: No fracture or dislocation. Preserved joint spaces. Osteopenia. No significant joint effusion on lateral view. IMPRESSION: No acute osseous abnormality. Electronically Signed   By: Karen Kays M.D.   On: 08/16/2023 15:52   DG Shoulder Right  Result Date: 08/16/2023 CLINICAL DATA:  Right shoulder pain EXAM: RIGHT SHOULDER - 2+ VIEW COMPARISON:  12/01/2022 FINDINGS: Early degenerative change in the humeral head. Small spurs from the acromion. No fracture or  dislocation. Airspace opacities in the right upper lung slightly more conspicuous than prior exam. Chronic reticular changes at the right lung base. IMPRESSION: 1. No acute findings. 2. Early degenerative change. 3. Right upper lung airspace disease. Electronically Signed   By: Corlis Leak M.D.   On: 08/16/2023 14:01    Pertinent labs & imaging results that were available during my care of the patient were reviewed by me and considered in my medical decision making (see MDM for details).  Medications Ordered in ED Medications - No data to display                                                                                                                                   Procedures Procedures  (including critical care time)  Medical Decision Making / ED Course   MDM:  57 year old female presenting to the emergency department with arm pain. The patient was seen in the emergency department yesterday for the same.  She had x-rays performed which are negative.  Suspect muscular strain or sprain.  Began after stretching to reach in the backseat.  She has normal peripheral pulse.  She has no swelling to suggest DVT and no clear reason to have a DVT.  Range of motion is intact.  Her bicep tendon feels intact.  She reports her symptoms do improve with over-the-counter medication.  Recommended continuing this.  Provided sling for comfort, instructed not to keep this on at all times around stiffness.  Recommended follow-up with orthopedic surgeon if symptoms fail to improve.  Will discharge patient to home. All questions answered. Patient comfortable with plan of discharge. Return precautions discussed with patient and specified on the after visit summary.       Additional history obtained: -External records from outside source obtained and reviewed including: Chart review including previous notes, labs, imaging, consultation notes including ER visit yesterday   Medicines ordered and  prescription drug management: No orders of the defined types were placed in this encounter.   -I have reviewed the patients home medicines and have made adjustments as needed   Co morbidities that complicate the patient evaluation  Past Medical History:  Diagnosis Date   Allergy    Anxiety    COPD (chronic obstructive pulmonary disease) (HCC)    Emphysema of lung (HCC)    Hypertension  Dispostion: Disposition decision including need for hospitalization was considered, and patient discharged from emergency department.    Final Clinical Impression(s) / ED Diagnoses Final diagnoses:  Pain of right upper arm     This chart was dictated using voice recognition software.  Despite best efforts to proofread,  errors can occur which can change the documentation meaning.    Lonell Grandchild, MD 08/17/23 1122

## 2023-08-17 NOTE — Discharge Instructions (Signed)
We evaluated you for your arm pain.  We reviewed your x-rays from yesterday which were negative.  I think your symptoms are most likely due to a muscle sprain.  I do not think you have any tendon injuries.  We have given you a sling for comfort, please do not wear this at all times as it may make your arm stiff, but rather wear it as needed for comfort.  Please continue to take 600 mg of ibuprofen every 6 hours as needed for pain.  If it fails to improve I would recommend following up with an orthopedic surgeon, you can call Dr. Lesli Albee for surgical follow-up.  Please return if you develop any swelling in your arm, severe worsening pain, numbness or tingling, weakness, or any other concerning symptoms.

## 2023-08-17 NOTE — Progress Notes (Signed)
Orthopedic Tech Progress Note Patient Details:  Regina [name] 12/14/1965 [id number]  Ortho Devices Type of Ortho Device: Shoulder immobilizer Ortho Device/Splint Location: right Ortho Device/Splint Interventions: Ordered, Application, Adjustment   Post Interventions Patient Tolerated: Well Instructions Provided: Adjustment of device, Care of device  Kizzie Fantasia 08/17/2023, 11:18 AM

## 2023-08-27 ENCOUNTER — Encounter (HOSPITAL_COMMUNITY): Payer: Self-pay

## 2023-08-27 ENCOUNTER — Ambulatory Visit (HOSPITAL_COMMUNITY)
Admission: EM | Admit: 2023-08-27 | Discharge: 2023-08-27 | Disposition: A | Discharge status: Home / Self Care | Payer: 59 | Attending: Internal Medicine | Admitting: Internal Medicine

## 2023-08-27 DIAGNOSIS — M79601 Pain in right arm: Secondary | ICD-10-CM

## 2023-08-27 DIAGNOSIS — M25611 Stiffness of right shoulder, not elsewhere classified: Secondary | ICD-10-CM

## 2023-08-27 MED ORDER — DEXAMETHASONE SODIUM PHOSPHATE 10 MG/ML IJ SOLN
INTRAMUSCULAR | Status: AC
Start: 2023-08-27 — End: ?
  Filled 2023-08-27: qty 1

## 2023-08-27 MED ORDER — DEXAMETHASONE SODIUM PHOSPHATE 10 MG/ML IJ SOLN
10.0000 mg | Freq: Once | INTRAMUSCULAR | Status: AC
  Administered 2023-08-27: 10 mg via INTRAMUSCULAR

## 2023-08-27 MED ORDER — KETOROLAC TROMETHAMINE 30 MG/ML IJ SOLN
INTRAMUSCULAR | Status: AC
  Filled 2023-08-27: qty 1

## 2023-08-27 MED ORDER — KETOROLAC TROMETHAMINE 30 MG/ML IJ SOLN
30.0000 mg | Freq: Once | INTRAMUSCULAR | Status: AC
  Administered 2023-08-27: 30 mg via INTRAMUSCULAR

## 2023-08-27 NOTE — ED Provider Notes (Signed)
[facility]    CSN: [id number] Arrival date & time: 08/27/23  1352      History   Chief Complaint Chief Complaint  Patient presents with   Arm Pain    HPI Regina [name] is a 57 y.o. female.   57 year old female who presents to urgent care with complaints of right upper arm and shoulder pain.  She has been evaluated for the same symptoms on 12/3, 12/10 and 12/11.  She had x-rays done on 12/10 which were negative for acute process however there was some arthritis present.  She has been using ibuprofen.  The patient has also now been using a sling.  She reports some swelling in the upper arm.  She has not followed up with Ortho.  She reports that her symptoms have not changed much since the original visit. Denies numbness or tingling into the hand.    Arm Pain Pertinent negatives include no chest pain, no abdominal pain and no shortness of breath.    Past Medical History:  Diagnosis Date   Allergy    Anxiety    COPD (chronic obstructive pulmonary disease) (HCC)    Emphysema of lung (HCC)    Hypertension     Patient Active Problem List   Diagnosis Date Noted   Hyponatremia 04/16/2018   Depression 04/16/2018   Hypertensive urgency    HTN (hypertension) 01/13/2016   Right anterior shoulder pain 07/18/2014   Postconcussion syndrome 05/28/2014    Past Surgical History:  Procedure Laterality Date   dental procedure      OB History   No obstetric history on file.      Home Medications    Prior to Admission medications   Medication Sig Start Date End Date Taking? Authorizing Provider  albuterol (VENTOLIN HFA) 108 (90 Base) MCG/ACT inhaler INHALE 2 PUFFS INTO THE LUNGS EVERY 6 (SIX) HOURS AS NEEDED FOR WHEEZING OR SHORTNESS OF BREATH. 06/08/23   [name], NP  amLODipine (NORVASC) 10 MG tablet Take 1 tablet (10 mg total) by mouth daily. 04/19/22   [name], NP  aspirin EC 81 MG tablet Take 81 mg by mouth daily.    [provider]  Cholecalciferol (VITAMIN D3) 125 MCG (5000 UT) TABS Take 1 tab ONCE A DAY for vitamin insufficiency 03/25/22     fluticasone (FLONASE) 50 MCG/ACT nasal spray Place 2 sprays into both nostrils daily. Patient taking differently: Place 2 sprays into both nostrils daily as needed for allergies. 12/24/20   [name], NP  ibuprofen (ADVIL) 600 MG tablet Take 1 tablet (600 mg total) by mouth every 6 (six) hours as needed. 08/09/23   Merrilee Jansky, MD  Multiple Vitamin (MULTI-VITAMIN) tablet Take 1 tablet by mouth daily. 05/28/23     sodium chloride 1 g tablet Take 1 tablet (1 g total) by mouth in the morning and 1 tablet (1 g total) at noon and 1 tablet (1 g total) in the evening. Take with meals. 05/28/23     thiamine (VITAMIN B1) 100 MG tablet Take 1 tablet (100 mg total) by mouth daily. 05/28/23     traZODone (DESYREL) 50 MG tablet Take 1 tablet (50 mg total) by mouth at bedtime as needed. 10/04/22       Family History Family History  Problem Relation Age of Onset   Leukemia Mother    Hypertension Mother    Hypertension Father    Cancer Neg Hx    Diabetes Neg Hx  Heart disease Neg Hx    Colon cancer Neg Hx    Esophageal cancer Neg Hx    Rectal cancer Neg Hx    Stomach cancer Neg Hx     Social History Social History   Tobacco Use   Smoking status: Former    Current packs/day: 0.00    Types: Cigarettes    Quit date: 10/31/2019    Years since quitting: 3.8   Smokeless tobacco: Never  Vaping Use   Vaping status: Never Used  Substance Use Topics   Alcohol use: Yes    Alcohol/week: 6.0 standard drinks of alcohol    Types: 6 Cans of beer per week   Drug use: Yes    Frequency: 4.0 times per week    Types: Marijuana     Allergies   Pork allergy and Bee venom   Review of Systems Review of Systems  Constitutional:  Negative for chills and fever.  HENT:  Negative for ear pain and sore throat.   Eyes:  Negative for pain and visual disturbance.  Respiratory:   Negative for cough and shortness of breath.   Cardiovascular:  Negative for chest pain and palpitations.  Gastrointestinal:  Negative for abdominal pain and vomiting.  Genitourinary:  Negative for dysuria and hematuria.  Musculoskeletal:  Negative for arthralgias and back pain.       Right shoulder/arm pain with decreased ROM  Skin:  Negative for color change and rash.  Neurological:  Negative for seizures and syncope.  All other systems reviewed and are negative.    Physical Exam Triage Vital Signs ED Triage Vitals  Encounter Vitals Group     BP 08/27/23 1413 139/73     Systolic BP Percentile --      Diastolic BP Percentile --      Pulse Rate 08/27/23 1413 93     Resp 08/27/23 1413 18     Temp 08/27/23 1413 97.6 F (36.4 C)     Temp Source 08/27/23 1413 Oral     SpO2 08/27/23 1413 97 %     Weight 08/27/23 1411 115 lb (52.2 kg)     Height 08/27/23 1411 5\' 6"  (1.676 m)     Head Circumference --      Peak Flow --      Pain Score 08/27/23 1411 10     Pain Loc --      Pain Education --      Exclude from Growth Chart --    No data found.  Updated Vital Signs BP 139/73 (BP Location: Left Arm)   Pulse 93   Temp 97.6 F (36.4 C) (Oral)   Resp 18   Ht 5\' 6"  (1.676 m)   Wt 115 lb (52.2 kg)   LMP 06/06/2010   SpO2 97%   BMI 18.56 kg/m   Visual Acuity Right Eye Distance:   Left Eye Distance:   Bilateral Distance:    Right Eye Near:   Left Eye Near:    Bilateral Near:     Physical Exam Vitals and nursing note reviewed.  Constitutional:      General: She is not in acute distress.    Appearance: She is well-developed.  HENT:     Head: Normocephalic and atraumatic.  Eyes:     Conjunctiva/sclera: Conjunctivae normal.  Cardiovascular:     Rate and Rhythm: Normal rate and regular rhythm.     Heart sounds: No murmur heard. Pulmonary:     Effort: Pulmonary effort is normal. No  respiratory distress.     Breath sounds: Normal breath sounds.  Abdominal:      Palpations: Abdomen is soft.     Tenderness: There is no abdominal tenderness.  Musculoskeletal:        General: No swelling.     Right shoulder: Tenderness (triceps and shoulder) present. Decreased range of motion.     Cervical back: Neck supple.     Comments: Unable to even lift arm without patient saying no and requesting that I stop moving her arm at all.   Skin:    General: Skin is warm and dry.     Capillary Refill: Capillary refill takes less than 2 seconds.  Neurological:     Mental Status: She is alert.  Psychiatric:        Mood and Affect: Mood normal.      UC Treatments / Results  Labs (all labs ordered are listed, but only abnormal results are displayed) Labs Reviewed - No data to display  EKG   Radiology No results found.  Procedures Procedures (including critical care time)  Medications Ordered in UC Medications  ketorolac (TORADOL) 30 MG/ML injection 30 mg (has no administration in time range)  dexamethasone (DECADRON) injection 10 mg (has no administration in time range)    Initial Impression / Assessment and Plan / UC Course  I have reviewed the triage vital signs and the nursing notes.  Pertinent labs & imaging results that were available during my care of the patient were reviewed by me and considered in my medical decision making (see chart for details).     Right arm pain  Decreased ROM of right shoulder   Right arm pain with decreased range of motion in the right shoulder that has not improved in 3 weeks since the original injury. X-rays were negative fractures but did show some arthritis on 08/16/23. Has been seen on 12/3, 12/10 and 12/11 for the same symptoms. Given the severity of the symptoms and the length of time, this likely needs more advanced imaging. We will treat with the following:   Toradol injection for pain Decadron injection for inflammation (steroid) Prednisone 40mg  daily for 5 days. Take this in the mornings. Start  08/28/23 Call [facility] on Monday to schedule an appointment Wear sling for comfort but try to use arm when you are able to prevent stiffness from forming.  Return to urgent care or PCP if symptoms worsen or fail to resolve.    Final Clinical Impressions(s) / UC Diagnoses   Final diagnoses:  Right arm pain  Decreased ROM of right shoulder     Discharge Instructions      Right arm pain with decreased range of motion in the right shoulder that has not improved in 3 weeks since the original injury. X-rays were negative fractures but did show some arthritis. Given the severity of your symptoms and the length of time, this likely needs more advanced imaging. We will treat with the following:   Toradol injection for pain Decadron injection for inflammation (steroid) Prednisone 40mg  daily for 5 days. Take this in the mornings. Start 08/28/23 Call [facility] on Monday to schedule an appointment Wear sling for comfort but try to use arm when you are able to prevent stiffness from forming.  Return to urgent care or PCP if symptoms worsen or fail to resolve.     ED Prescriptions   None    PDMP not reviewed this encounter.   Doristine Mango  A, PA-C 08/27/23 1450

## 2023-08-27 NOTE — Discharge Instructions (Addendum)
Right arm pain with decreased range of motion in the right shoulder that has not improved in 3 weeks since the original injury. X-rays were negative fractures but did show some arthritis. Given the severity of your symptoms and the length of time, this likely needs more advanced imaging. We will treat with the following:   Toradol injection for pain Decadron injection for inflammation (steroid) Prednisone 40mg  daily for 5 days. Take this in the mornings. Start 08/28/23 Call [facility] on Monday to schedule an appointment Wear sling for comfort but try to use arm when you are able to prevent stiffness from forming.  Return to urgent care or PCP if symptoms worsen or fail to resolve.

## 2023-08-27 NOTE — ED Triage Notes (Signed)
Pt presents with right arm pain for approximately three weeks. Pt currently has sling applied to extremity. Pt states she was seen here early December and the doctor said it was sprained. Ibuprofen 600 mg prescribed "I took some but they didn't do anything but make me sleepy." Pt currently rates her right arm pain a 10/10.

## 2023-08-29 ENCOUNTER — Other Ambulatory Visit: Payer: Self-pay

## 2023-08-29 ENCOUNTER — Telehealth (HOSPITAL_COMMUNITY): Payer: Self-pay | Admitting: Emergency Medicine

## 2023-08-29 MED ORDER — PREDNISONE 20 MG PO TABS
40.0000 mg | ORAL_TABLET | Freq: Every day | ORAL | 0 refills | Status: AC
Start: 2023-08-29 — End: 2023-09-03
  Filled 2023-08-29: qty 10, 5d supply, fill #0

## 2023-08-29 NOTE — Telephone Encounter (Signed)
Called patient based on phone number on file.  Verified patient identity using 2 identifiers.  Patient notified the provider on duty today has sent prescription to her pharmacy.  Patient agreeable

## 2023-08-29 NOTE — Telephone Encounter (Signed)
Viewed provider note, patient was supposed to get prescription for  prednisone; 40 mg x 5 days. Sent to pharmacy on file

## 2023-09-08 DIAGNOSIS — M25511 Pain in right shoulder: Secondary | ICD-10-CM | POA: Diagnosis not present

## 2023-09-17 ENCOUNTER — Emergency Department (HOSPITAL_COMMUNITY): Payer: 59

## 2023-09-17 ENCOUNTER — Observation Stay (HOSPITAL_COMMUNITY): Payer: 59

## 2023-09-17 ENCOUNTER — Encounter (HOSPITAL_COMMUNITY): Payer: Self-pay | Admitting: *Deleted

## 2023-09-17 ENCOUNTER — Other Ambulatory Visit: Payer: Self-pay

## 2023-09-17 ENCOUNTER — Inpatient Hospital Stay (HOSPITAL_COMMUNITY)
Admission: EM | Admit: 2023-09-17 | Discharge: 2023-09-20 | DRG: 080 | Disposition: A | Discharge status: Home / Self Care | Payer: 59 | Attending: Internal Medicine | Admitting: Internal Medicine

## 2023-09-17 DIAGNOSIS — Z91014 Allergy to mammalian meats: Secondary | ICD-10-CM

## 2023-09-17 DIAGNOSIS — G935 Compression of brain: Secondary | ICD-10-CM | POA: Diagnosis not present

## 2023-09-17 DIAGNOSIS — R471 Dysarthria and anarthria: Secondary | ICD-10-CM | POA: Diagnosis present

## 2023-09-17 DIAGNOSIS — G936 Cerebral edema: Secondary | ICD-10-CM | POA: Diagnosis present

## 2023-09-17 DIAGNOSIS — M84521A Pathological fracture in neoplastic disease, right humerus, initial encounter for fracture: Secondary | ICD-10-CM

## 2023-09-17 DIAGNOSIS — Z87891 Personal history of nicotine dependence: Secondary | ICD-10-CM

## 2023-09-17 DIAGNOSIS — C799 Secondary malignant neoplasm of unspecified site: Secondary | ICD-10-CM | POA: Diagnosis not present

## 2023-09-17 DIAGNOSIS — R29818 Other symptoms and signs involving the nervous system: Secondary | ICD-10-CM | POA: Diagnosis not present

## 2023-09-17 DIAGNOSIS — C7951 Secondary malignant neoplasm of bone: Secondary | ICD-10-CM | POA: Diagnosis present

## 2023-09-17 DIAGNOSIS — Z8249 Family history of ischemic heart disease and other diseases of the circulatory system: Secondary | ICD-10-CM

## 2023-09-17 DIAGNOSIS — J439 Emphysema, unspecified: Secondary | ICD-10-CM | POA: Diagnosis present

## 2023-09-17 DIAGNOSIS — D430 Neoplasm of uncertain behavior of brain, supratentorial: Secondary | ICD-10-CM | POA: Diagnosis present

## 2023-09-17 DIAGNOSIS — M19011 Primary osteoarthritis, right shoulder: Secondary | ICD-10-CM | POA: Diagnosis not present

## 2023-09-17 DIAGNOSIS — Z7982 Long term (current) use of aspirin: Secondary | ICD-10-CM

## 2023-09-17 DIAGNOSIS — Q043 Other reduction deformities of brain: Secondary | ICD-10-CM | POA: Diagnosis not present

## 2023-09-17 DIAGNOSIS — R590 Localized enlarged lymph nodes: Secondary | ICD-10-CM | POA: Diagnosis not present

## 2023-09-17 DIAGNOSIS — C3411 Malignant neoplasm of upper lobe, right bronchus or lung: Secondary | ICD-10-CM | POA: Diagnosis present

## 2023-09-17 DIAGNOSIS — I6782 Cerebral ischemia: Secondary | ICD-10-CM | POA: Diagnosis not present

## 2023-09-17 DIAGNOSIS — I6523 Occlusion and stenosis of bilateral carotid arteries: Secondary | ICD-10-CM | POA: Diagnosis not present

## 2023-09-17 DIAGNOSIS — Z043 Encounter for examination and observation following other accident: Secondary | ICD-10-CM | POA: Diagnosis not present

## 2023-09-17 DIAGNOSIS — I10 Essential (primary) hypertension: Secondary | ICD-10-CM | POA: Diagnosis present

## 2023-09-17 DIAGNOSIS — F419 Anxiety disorder, unspecified: Secondary | ICD-10-CM | POA: Diagnosis present

## 2023-09-17 DIAGNOSIS — R29810 Facial weakness: Secondary | ICD-10-CM | POA: Diagnosis present

## 2023-09-17 DIAGNOSIS — G9389 Other specified disorders of brain: Secondary | ICD-10-CM | POA: Diagnosis not present

## 2023-09-17 DIAGNOSIS — R000 Tachycardia, unspecified: Secondary | ICD-10-CM | POA: Diagnosis not present

## 2023-09-17 DIAGNOSIS — Z806 Family history of leukemia: Secondary | ICD-10-CM

## 2023-09-17 DIAGNOSIS — G939 Disorder of brain, unspecified: Secondary | ICD-10-CM | POA: Diagnosis not present

## 2023-09-17 DIAGNOSIS — F32A Depression, unspecified: Secondary | ICD-10-CM | POA: Diagnosis present

## 2023-09-17 DIAGNOSIS — M84421A Pathological fracture, right humerus, initial encounter for fracture: Secondary | ICD-10-CM | POA: Diagnosis not present

## 2023-09-17 DIAGNOSIS — G8191 Hemiplegia, unspecified affecting right dominant side: Secondary | ICD-10-CM | POA: Diagnosis present

## 2023-09-17 DIAGNOSIS — R4701 Aphasia: Secondary | ICD-10-CM | POA: Diagnosis present

## 2023-09-17 DIAGNOSIS — R918 Other nonspecific abnormal finding of lung field: Secondary | ICD-10-CM | POA: Diagnosis not present

## 2023-09-17 DIAGNOSIS — Z91030 Bee allergy status: Secondary | ICD-10-CM

## 2023-09-17 DIAGNOSIS — F1010 Alcohol abuse, uncomplicated: Secondary | ICD-10-CM | POA: Diagnosis present

## 2023-09-17 DIAGNOSIS — S52591A Other fractures of lower end of right radius, initial encounter for closed fracture: Secondary | ICD-10-CM | POA: Diagnosis not present

## 2023-09-17 DIAGNOSIS — J449 Chronic obstructive pulmonary disease, unspecified: Secondary | ICD-10-CM | POA: Diagnosis present

## 2023-09-17 DIAGNOSIS — Z79899 Other long term (current) drug therapy: Secondary | ICD-10-CM

## 2023-09-17 LAB — RAPID URINE DRUG SCREEN, HOSP PERFORMED
Amphetamines: NOT DETECTED
Barbiturates: NOT DETECTED
Benzodiazepines: NOT DETECTED
Cocaine: NOT DETECTED
Opiates: NOT DETECTED
Tetrahydrocannabinol: POSITIVE — AB

## 2023-09-17 LAB — TROPONIN I (HIGH SENSITIVITY)
Troponin I (High Sensitivity): 18 ng/L — ABNORMAL HIGH (ref <18)
Troponin I (High Sensitivity): 22 ng/L — ABNORMAL HIGH (ref <18)

## 2023-09-17 LAB — URINALYSIS, ROUTINE W REFLEX MICROSCOPIC
Bacteria, UA: NONE SEEN
Bilirubin Urine: NEGATIVE
Glucose, UA: NEGATIVE mg/dL
Ketones, ur: 5 mg/dL — AB
Leukocytes,Ua: NEGATIVE
Nitrite: NEGATIVE
Protein, ur: NEGATIVE mg/dL
Specific Gravity, Urine: 1.027 (ref 1.005–1.030)
pH: 5 (ref 5.0–8.0)

## 2023-09-17 LAB — I-STAT CHEM 8, ED
BUN: 10 mg/dL (ref 6–20)
Calcium, Ion: 1.25 mmol/L (ref 1.15–1.40)
Chloride: 98 mmol/L (ref 98–111)
Creatinine, Ser: 0.7 mg/dL (ref 0.44–1.00)
Glucose, Bld: 80 mg/dL (ref 70–99)
HCT: 48 % — ABNORMAL HIGH (ref 36.0–46.0)
Hemoglobin: 16.3 g/dL — ABNORMAL HIGH (ref 12.0–15.0)
Potassium: 3.1 mmol/L — ABNORMAL LOW (ref 3.5–5.1)
Sodium: 135 mmol/L (ref 135–145)
TCO2: 24 mmol/L (ref 22–32)

## 2023-09-17 LAB — CBC
HCT: 43.9 % (ref 36.0–46.0)
Hemoglobin: 14.9 g/dL (ref 12.0–15.0)
MCH: 29.8 pg (ref 26.0–34.0)
MCHC: 33.9 g/dL (ref 30.0–36.0)
MCV: 87.8 fL (ref 80.0–100.0)
Platelets: 372 10*3/uL (ref 150–400)
RBC: 5 MIL/uL (ref 3.87–5.11)
RDW: 14.3 % (ref 11.5–15.5)
WBC: 9.2 10*3/uL (ref 4.0–10.5)
nRBC: 0 % (ref 0.0–0.2)

## 2023-09-17 LAB — BASIC METABOLIC PANEL
Anion gap: 14 (ref 5–15)
BUN: 10 mg/dL (ref 6–20)
CO2: 18 mmol/L — ABNORMAL LOW (ref 22–32)
Calcium: 10.3 mg/dL (ref 8.9–10.3)
Chloride: 102 mmol/L (ref 98–111)
Creatinine, Ser: 0.73 mg/dL (ref 0.44–1.00)
GFR, Estimated: >60 mL/min (ref >60)
Glucose, Bld: 80 mg/dL (ref 70–99)
Potassium: 3.6 mmol/L (ref 3.5–5.1)
Sodium: 134 mmol/L — ABNORMAL LOW (ref 135–145)

## 2023-09-17 LAB — CBG MONITORING, ED: Glucose-Capillary: 87 mg/dL (ref 70–99)

## 2023-09-17 LAB — ETHANOL: Alcohol, Ethyl (B): <10 mg/dL (ref <10)

## 2023-09-17 LAB — APTT
aPTT: 28 s (ref 24–36)
aPTT: 28 s (ref 24–36)

## 2023-09-17 LAB — MAGNESIUM: Magnesium: 1.7 mg/dL (ref 1.7–2.4)

## 2023-09-17 MED ORDER — IOHEXOL 350 MG/ML SOLN
75.0000 mL | Freq: Once | INTRAVENOUS | Status: AC | PRN
  Administered 2023-09-17: 75 mL via INTRAVENOUS

## 2023-09-17 MED ORDER — DEXAMETHASONE SODIUM PHOSPHATE 10 MG/ML IJ SOLN
10.0000 mg | Freq: Once | INTRAMUSCULAR | Status: AC
  Administered 2023-09-17: 10 mg via INTRAVENOUS
  Filled 2023-09-17: qty 1

## 2023-09-17 MED ORDER — POTASSIUM CHLORIDE 10 MEQ/100ML IV SOLN
10.0000 meq | INTRAVENOUS | Status: AC
  Administered 2023-09-17 (×2): 10 meq via INTRAVENOUS
  Filled 2023-09-17 (×2): qty 100

## 2023-09-17 MED ORDER — PROCHLORPERAZINE EDISYLATE 10 MG/2ML IJ SOLN: 5.0000 mg | Freq: Four times a day (QID) | INTRAMUSCULAR | Status: DC | PRN

## 2023-09-17 MED ORDER — DEXAMETHASONE SODIUM PHOSPHATE 4 MG/ML IJ SOLN
4.0000 mg | Freq: Four times a day (QID) | INTRAMUSCULAR | Status: DC
  Administered 2023-09-18 – 2023-09-20 (×11): 4 mg via INTRAVENOUS
  Filled 2023-09-17 (×11): qty 1

## 2023-09-17 MED ORDER — BISACODYL 10 MG RE SUPP: 10.0000 mg | Freq: Every day | RECTAL | Status: DC | PRN

## 2023-09-17 MED ORDER — GADOBUTROL 1 MMOL/ML IV SOLN
5.0000 mL | Freq: Once | INTRAVENOUS | Status: AC | PRN
  Administered 2023-09-17: 5 mL via INTRAVENOUS

## 2023-09-17 MED ORDER — ACETAMINOPHEN 650 MG RE SUPP: 650.0000 mg | Freq: Four times a day (QID) | RECTAL | Status: DC | PRN

## 2023-09-17 MED ORDER — ACETAMINOPHEN 325 MG PO TABS: 650.0000 mg | ORAL_TABLET | Freq: Four times a day (QID) | ORAL | Status: DC | PRN

## 2023-09-17 MED ORDER — HYDROMORPHONE HCL 1 MG/ML IJ SOLN
0.5000 mg | INTRAMUSCULAR | Status: DC | PRN
  Administered 2023-09-18 – 2023-09-20 (×5): 0.5 mg via INTRAVENOUS
  Filled 2023-09-17 (×5): qty 0.5

## 2023-09-17 MED ORDER — IOHEXOL 350 MG/ML SOLN
60.0000 mL | Freq: Once | INTRAVENOUS | Status: AC | PRN
  Administered 2023-09-17: 60 mL via INTRAVENOUS

## 2023-09-17 MED ORDER — LORAZEPAM 2 MG/ML IJ SOLN: 1.0000 mg | Freq: Once | INTRAMUSCULAR | Status: DC | PRN

## 2023-09-17 MED ORDER — IPRATROPIUM-ALBUTEROL 0.5-2.5 (3) MG/3ML IN SOLN: 3.0000 mL | RESPIRATORY_TRACT | Status: DC | PRN

## 2023-09-17 MED ORDER — MAGNESIUM SULFATE IN D5W 1-5 GM/100ML-% IV SOLN
1.0000 g | Freq: Once | INTRAVENOUS | Status: AC
  Administered 2023-09-17: 1 g via INTRAVENOUS
  Filled 2023-09-17: qty 100

## 2023-09-17 MED ORDER — DEXAMETHASONE SODIUM PHOSPHATE 10 MG/ML IJ SOLN: 8.0000 mg | Freq: Four times a day (QID) | INTRAMUSCULAR | Status: DC

## 2023-09-17 MED ORDER — LABETALOL HCL 5 MG/ML IV SOLN
10.0000 mg | INTRAVENOUS | Status: DC | PRN
Start: 2023-09-17 — End: 2023-09-20

## 2023-09-17 MED ORDER — SODIUM CHLORIDE 0.9% FLUSH
3.0000 mL | Freq: Two times a day (BID) | INTRAVENOUS | Status: DC
  Administered 2023-09-18 – 2023-09-20 (×5): 3 mL via INTRAVENOUS

## 2023-09-17 MED ORDER — SODIUM CHLORIDE 0.9 % IV BOLUS
1000.0000 mL | Freq: Once | INTRAVENOUS | Status: AC
  Administered 2023-09-17: 1000 mL via INTRAVENOUS

## 2023-09-17 MED ORDER — SODIUM CHLORIDE 0.9 % IV SOLN
INTRAVENOUS | Status: AC
Start: 2023-09-17 — End: 2023-09-18

## 2023-09-17 MED ORDER — LEVETIRACETAM IN NACL 500 MG/100ML IV SOLN: 500.0000 mg | Freq: Once | INTRAVENOUS | Status: DC

## 2023-09-17 MED ORDER — FENTANYL CITRATE PF 50 MCG/ML IJ SOSY: 12.5000 ug | PREFILLED_SYRINGE | INTRAMUSCULAR | Status: DC | PRN

## 2023-09-17 MED ORDER — LEVETIRACETAM IN NACL 500 MG/100ML IV SOLN
500.0000 mg | Freq: Two times a day (BID) | INTRAVENOUS | Status: DC
Start: 2023-09-17 — End: 2023-09-20
  Administered 2023-09-17 – 2023-09-20 (×6): 500 mg via INTRAVENOUS
  Filled 2023-09-17 (×6): qty 100

## 2023-09-17 NOTE — H&P (Signed)
 History and Physical    Lauriann Milillo FMW:[id number] DOB: 02/20/1966 DOA: 09/17/2023  PCP: Celestia Rosaline SQUIBB, NP   Patient coming from: Home   Chief Complaint: Aphasia, right-sided weakness, right arm pain    HPI: Ravinder Hofland is a 58 y.o. female with medical history significant for COPD, depression, anxiety, and hypertension, presenting to the emergency department with aphasia, right sided weakness, and right arm pain.   Patient has been experiencing headaches recently, but went to work yesterday and has been able to perform her usual activities until this morning when she developed difficulty speaking and right arm weakness.  She also fell and has been guarding her right arm.  She asked her son to take her to the emergency department today.  ED Course: Upon arrival to the ED, patient is found to be afebrile and saturating well on room air with elevated heart rate and stable blood pressure.  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   Neurosurgery was consulted by the ED physician and recommended starting Decadron  and Keppra .  Review of Systems:  History limited by the patient's clinical condition.  Past Medical History:  Diagnosis Date   Allergy    Anxiety    COPD (chronic obstructive pulmonary disease) (HCC)    Emphysema of lung (HCC)    Hypertension     Past Surgical History:  Procedure Laterality Date   dental procedure      Social History:   reports that she quit smoking about 3 years ago. Her smoking use included cigarettes. She has never used smokeless tobacco. She reports current alcohol  use of about 6.0 standard drinks of alcohol  per week. She reports current drug use. Frequency: 4.00 times per week. Drug: Marijuana.  Allergies  Allergen Reactions   Pork Allergy Nausea And Vomiting   Bee Venom Hives    Family History  Problem Relation Age of Onset   Leukemia Mother     Hypertension Mother    Hypertension Father    Cancer Neg Hx    Diabetes Neg Hx    Heart disease Neg Hx    Colon cancer Neg Hx    Esophageal cancer Neg Hx    Rectal cancer Neg Hx    Stomach cancer Neg Hx      Prior to Admission medications   Medication Sig Start Date End Date Taking? Authorizing Provider  albuterol  (VENTOLIN  HFA) 108 (90 Base) MCG/ACT inhaler INHALE 2 PUFFS INTO THE LUNGS EVERY 6 (SIX) HOURS AS NEEDED FOR WHEEZING OR SHORTNESS OF BREATH. 06/08/23   Celestia Rosaline SQUIBB, NP  amLODipine  (NORVASC ) 10 MG tablet Take 1 tablet (10 mg total) by mouth daily. 04/19/22   Celestia Rosaline SQUIBB, NP  aspirin  EC 81 MG tablet Take 81 mg by mouth daily.    [provider]  Cholecalciferol (VITAMIN D3) 125 MCG (5000 UT) TABS Take 1 tab ONCE A DAY for vitamin insufficiency 03/25/22     fluticasone  (FLONASE ) 50 MCG/ACT nasal spray Place 2 sprays into both nostrils daily. Patient taking differently: Place 2 sprays into both nostrils daily as needed for allergies. 12/24/20   Celestia Rosaline SQUIBB, NP  ibuprofen  (ADVIL ) 600 MG tablet Take 1 tablet (600 mg total) by mouth every 6 (six) hours as needed. 08/09/23   Blaise Aleene KIDD, MD  Multiple Vitamin (MULTI-VITAMIN) tablet Take 1 tablet by mouth daily. 05/28/23     sodium chloride  1 g tablet Take  1 tablet (1 g total) by mouth in the morning and 1 tablet (1 g total) at noon and 1 tablet (1 g total) in the evening. Take with meals. 05/28/23     thiamine  (VITAMIN B1) 100 MG tablet Take 1 tablet (100 mg total) by mouth daily. 05/28/23     traZODone  (DESYREL ) 50 MG tablet Take 1 tablet (50 mg total) by mouth at bedtime as needed. 10/04/22       Physical Exam: Vitals:   09/17/23 1745 09/17/23 1811 09/17/23 1815 09/17/23 1825  BP: (!) 159/108 (!) 178/114 (!) 162/95 (!) 167/94  Pulse: (!) 111 (!) 103 99 (!) 101  Resp: 20 15 14 15   Temp:      TempSrc:      SpO2: 97% 97% 96% 94%  Weight:      Height:         Constitutional: NAD, no pallor or  diaphoresis   Eyes: PERTLA, lids and conjunctivae normal ENMT: Mucous membranes are moist. Posterior pharynx clear of any exudate or lesions.   Neck: supple, no masses  Respiratory: no wheezing, no crackles. No accessory muscle use.  Cardiovascular: S1 & S2 heard, regular rate and rhythm. No extremity edema.   Abdomen: No distension, no tenderness, soft. Bowel sounds active.  Musculoskeletal: no clubbing / cyanosis. Right arm deformity and pain, neurovascularly intact.   Skin: no significant rashes, lesions, ulcers. Warm, dry, well-perfused. Neurologic: Expressive aphasia. No gross facial asymmetry. Right arm weakness, strength testing limited by pain from suspected fracture. Alert, makes eye-contact, but mainly giving only yes/no answers.   Psychiatric: Calm. Cooperative.    Labs and Imaging on Admission: I have personally reviewed following labs and imaging studies  CBC: Recent Labs  Lab 09/17/23 1715 09/17/23 1810  WBC 9.2  --   HGB 14.9 16.3*  HCT 43.9 48.0*  MCV 87.8  --   PLT 372  --    Basic Metabolic Panel: Recent Labs  Lab 09/17/23 1715 09/17/23 1810  NA 134* 135  K 3.6 3.1*  CL 102 98  CO2 18*  --   GLUCOSE 80 80  BUN 10 10  CREATININE 0.73 0.70  CALCIUM  10.3  --   MG 1.7  --    GFR: Estimated Creatinine Clearance: 62.8 mL/min (by C-G formula based on SCr of 0.7 mg/dL). Liver Function Tests: No results for input(s): AST, ALT, ALKPHOS, BILITOT, PROT, ALBUMIN in the last 168 hours. No results for input(s): LIPASE, AMYLASE in the last 168 hours. No results for input(s): AMMONIA in the last 168 hours. Coagulation Profile: No results for input(s): INR, PROTIME in the last 168 hours. Cardiac Enzymes: No results for input(s): CKTOTAL, CKMB, CKMBINDEX, TROPONINI in the last 168 hours. BNP (last 3 results) No results for input(s): PROBNP in the last 8760 hours. HbA1C: No results for input(s): HGBA1C in the last 72  hours. CBG: Recent Labs  Lab 09/17/23 1802  GLUCAP 87   Lipid Profile: No results for input(s): CHOL, HDL, LDLCALC, TRIG, CHOLHDL, LDLDIRECT in the last 72 hours. Thyroid  Function Tests: No results for input(s): TSH, T4TOTAL, FREET4, T3FREE, THYROIDAB in the last 72 hours. Anemia Panel: No results for input(s): VITAMINB12, FOLATE, FERRITIN, TIBC, IRON, RETICCTPCT in the last 72 hours. Urine analysis:    Component Value Date/Time   COLORURINE COLORLESS (A) 10/04/2017 1918   APPEARANCEUR CLEAR 10/04/2017 1918   LABSPEC 1.002 (L) 10/04/2017 1918   PHURINE 6.0 10/04/2017 1918   GLUCOSEU NEGATIVE 10/04/2017 1918   HGBUR MODERATE (  A) 10/04/2017 1918   BILIRUBINUR NEGATIVE 10/04/2017 1918   KETONESUR NEGATIVE 10/04/2017 1918   PROTEINUR NEGATIVE 10/04/2017 1918   NITRITE NEGATIVE 10/04/2017 1918   LEUKOCYTESUR TRACE (A) 10/04/2017 1918   Sepsis Labs: @LABRCNTIP (procalcitonin:4,lacticidven:4) )No results found for this or any previous visit (from the past 240 hours).   Radiological Exams on Admission: CT CHEST ABDOMEN PELVIS W CONTRAST Result Date: 09/17/2023 CLINICAL DATA:  Left frontal lobe mass, possible metastatic lesion, assessment for other metastatic disease or primary. * Tracking Code: BO * EXAM: CT CHEST, ABDOMEN, AND PELVIS WITH CONTRAST TECHNIQUE: Multidetector CT imaging of the chest, abdomen and pelvis was performed following the standard protocol during bolus administration of intravenous contrast. RADIATION DOSE REDUCTION: This exam was performed according to the departmental dose-optimization program which includes automated exposure control, adjustment of the mA and/or kV according to patient size and/or use of iterative reconstruction technique. CONTRAST:  60mL OMNIPAQUE  IOHEXOL  350 MG/ML SOLN COMPARISON:  05/25/2023 FINDINGS: CT CHEST FINDINGS Cardiovascular: Mild atheromatous vascular calcification of the thoracic aorta.  Mediastinum/Nodes: Pathologic right supraclavicular, right paratracheal, bilateral hilar, subcarinal, and bilateral infrahilar adenopathy. Index right paratracheal node 1.4 cm in short axis on image 17 series 3. Index left hilar node 1.3 cm in short axis on image 26 series 3. Lungs/Pleura: Substantial biapical pleuroparenchymal scarring. This has an asymmetric nodular component in the right upper lobe measuring 2.9 by 1.5 by 1.8 cm (volume = 4.1 cm^3) which could be from scarring or tumor. Extensive emphysema.  Honeycombing at the lung bases. Musculoskeletal: Unremarkable CT ABDOMEN PELVIS FINDINGS Hepatobiliary: Unremarkable Pancreas: Unremarkable Spleen: Unremarkable Adrenals/Urinary Tract: There is dense contrast in the renal collecting systems, ureters, and urinary bladder due to recent injection for the CT angiogram. This lowers sensitivity for nonobstructive calculi. No renal or adrenal mass. No obvious abnormal filling defect along the urothelium. Stomach/Bowel: Mild prominence of stool in the rectal vault. Normal appendix. Otherwise unremarkable. Vascular/Lymphatic: Atherosclerosis is present, including aortoiliac atherosclerotic disease. Reproductive: Posterior positioning of the uterus with suspected uterine fibroids and associated calcifications. Otherwise unremarkable. Other: No supplemental non-categorized findings. Musculoskeletal: Unremarkable IMPRESSION: 1. Pathologic right supraclavicular, right paratracheal, bilateral hilar, subcarinal, and bilateral infrahilar adenopathy. 2. Substantial biapical pleuroparenchymal scarring. This has an asymmetric nodular component in the right upper lobe measuring 2.9 by 1.5 by 1.8 cm (volume = 4.1 cm^ 3) which could be from scarring or tumor. PET-CT could help differentiate. 3. Extensive emphysema. 4. Honeycombing at the lung bases, compatible with interstitial lung disease. 5. Mild prominence of stool in the rectal vault. 6. Posterior positioning of the uterus  with suspected uterine fibroids and associated calcifications. 7. Aortic atherosclerosis. Aortic Atherosclerosis (ICD10-I70.0) and Emphysema (ICD10-J43.9). Electronically Signed   By: Ryan Salvage M.D.   On: 09/17/2023 19:23   CT ANGIO HEAD NECK W WO CM (CODE STROKE) Addendum Date: 09/17/2023 ADDENDUM REPORT: 09/17/2023 19:02 ADDENDUM: Delayed images demonstrate peripheral enhancement the mass measuring 2.6 x 2.3 x 2.1 cm. Other foci of enhancement are present. This most likely represents a solitary metastasis. Electronically Signed   By: Lonni Necessary M.D.   On: 09/17/2023 19:02   Result Date: 09/17/2023 CLINICAL DATA:  Code stroke.  Left frontal lobe mass. EXAM: CT ANGIOGRAPHY HEAD AND NECK WITH AND WITHOUT CONTRAST TECHNIQUE: Multidetector CT imaging of the head and neck was performed using the standard protocol during bolus administration of intravenous contrast. Multiplanar CT image reconstructions and MIPs were obtained to evaluate the vascular anatomy. Carotid stenosis measurements (when applicable) are obtained utilizing NASCET  criteria, using the distal internal carotid diameter as the denominator. RADIATION DOSE REDUCTION: This exam was performed according to the departmental dose-optimization program which includes automated exposure control, adjustment of the mA and/or kV according to patient size and/or use of iterative reconstruction technique. CONTRAST:  75mL OMNIPAQUE  IOHEXOL  350 MG/ML SOLN COMPARISON:  CT head without contrast 09/17/2023 FINDINGS: CTA NECK FINDINGS Aortic arch: A 3 vessel arch configuration is present. The aortic arch is incompletely imaged on this study. No significant focal stenosis is present the great vessel origins. Right carotid system: Atherosclerotic calcifications are present at the right carotid bifurcation predominantly involving the external carotid artery. The right common carotid artery is normal. The cervical right ICA is otherwise normal. Left  carotid system: The left common carotid artery is within normal limits. Mild noncalcified plaque is present posteriorly at the bifurcation without significant stenosis. The cervical left ICA is otherwise normal. Vertebral arteries: The left vertebral artery is dominant vessel. Both vertebral arteries originate from the subclavian arteries without significant stenosis. No significant stenosis is present in either vertebral artery in the neck. Skeleton: Vertebral body heights and alignment are normal. No focal osseous lesions are present. The patient is edentulous. Other neck: Soft tissues the neck are otherwise unremarkable. Salivary glands are within normal limits. Thyroid  is normal. No significant adenopathy is present. No focal mucosal or submucosal lesions are present. Upper chest: Central and paraseptal emphysematous changes are present. Peripheral lung nodules are present in the right upper lobe. Please see dedicated CT of the chest for further description. No pneumothorax is present. Paratracheal adenopathy is present. Review of the MIP images confirms the above findings CTA HEAD FINDINGS Anterior circulation: Internal carotid arteries are within normal limits the skull base to the ICA termini. The A1 and M1 segments are normal. Anterior communicating artery is patent. Mass effect deviates the left MCA and bilateral ACA branch vessels. No significant proximal stenosis or occlusion is present. Posterior circulation: The PICA origins are visualized and normal. Scratched at the left PICA origin is visualized and normal. The right PICA scratched at the hypoplastic right vertebral artery terminates at the PICA. The basilar artery is normal. The superior cerebellar arteries are patent. Posterior cerebral arteries originate from basilar tip. A right posterior communicating artery contributes. The PCA branch vessels are normal bilaterally. No aneurysm is present. Venous sinuses: The dural sinuses are patent. The  straight sinus and deep cerebral veins are intact. Cortical veins are within normal limits. No significant vascular malformation is evident. Anatomic variants: None Review of the MIP images confirms the above findings IMPRESSION: 1. No emergent large vessel occlusion. 2. Mass effect deviates the left MCA and bilateral ACA branch vessels without significant proximal stenosis or occlusion. 3. Mild atherosclerotic changes at the carotid bifurcations bilaterally without significant stenosis. 4. Peripheral lung nodules in the right upper lobe. Please see dedicated CT of the chest for further description. 5. Paratracheal adenopathy is related to metastatic disease. Please see dedicated CT of the chest for further description. 6.  Emphysema (ICD10-J43.9). Electronically Signed: By: Lonni Necessary M.D. On: 09/17/2023 18:53   CT HEAD CODE STROKE WO CONTRAST Result Date: 09/17/2023 CLINICAL DATA:  Code stroke. Neuro deficit, acute, stroke suspected. EXAM: CT HEAD WITHOUT CONTRAST TECHNIQUE: Contiguous axial images were obtained from the base of the skull through the vertex without intravenous contrast. RADIATION DOSE REDUCTION: This exam was performed according to the departmental dose-optimization program which includes automated exposure control, adjustment of the mA and/or kV according to patient  size and/or use of iterative reconstruction technique. COMPARISON:  None Available. FINDINGS: Brain: A left frontal lobe mass measures 20 x 20 x 18 mm. Extensive surrounding vasogenic edema present throughout the left frontal lobe. Mass effect is present with effacement of the sulci and 8 mm of left to right midline shift. Asymmetric effacement of the left lateral ventricle is noted. No other discrete lesions are present. No acute cortical infarcts are present. The brainstem and cerebellum are within normal limits. Midline structures are within normal limits. Vascular: No hyperdense vessel or unexpected calcification.  Skull: Calvarium is intact. No focal lytic or blastic lesions are present. No significant extracranial soft tissue lesion is present. Sinuses/Orbits: The paranasal sinuses and mastoid air cells are clear. The globes and orbits are within normal limits. IMPRESSION: 1. 20 x 20 x 18 mm left frontal lobe mass with extensive surrounding vasogenic edema. This is concerning for a primary or metastatic neoplasm. Recommend MRI the brain without and with contrast for further evaluation. 2. Mass effect with effacement of the sulci and 8 mm of left to right midline shift. 3. No other discrete lesions are present. 4. No other acute intracranial abnormality. The above was relayed via text pager to Dr. ELIGIO LAV on 09/17/2023 at 18:25 . Electronically Signed   By: Lonni Necessary M.D.   On: 09/17/2023 18:25   DG Chest Portable 1 View Result Date: 09/17/2023 CLINICAL DATA:  Tachycardia. EXAM: PORTABLE CHEST 1 VIEW COMPARISON:  Radiograph 05/25/2023 FINDINGS: The heart is normal in size. Stable mediastinal contours. Chronic hyperinflation and emphysema. There is biapical pleuroparenchymal scarring, similar. Reticular opacities at both lung bases are without significant interval change, typical of honeycombing. No definite acute airspace disease. No features of pulmonary edema. No pneumothorax or significant pleural effusion. IMPRESSION: 1. Chronic hyperinflation and emphysema. 2. Bibasilar reticular opacities, typical of honeycombing, without significant interval change. 3. Biapical pleuroparenchymal scarring. Electronically Signed   By: Andrea Gasman M.D.   On: 09/17/2023 18:02    EKG: Independently reviewed. Atrial fib vs sinus tachycardia with PACs.   Assessment/Plan   1. Brain mass  - Check MRI brain with and without contrast and EEG, start Decadron , start prophylactic Keppra , continue neuro checks - Neurosurgical consultation appreciated, they recommend biopsy from chest lesion   2. COPD  - Not in  exacerbation    - Breathing treatments as-needed   3. Hypertension  - Treating as-needed only for now    4. Suspected right humerus fracture  - Neurovascularly intact  - Pain-control, radiographs, immobilize  5. ?Atrial fibrillation  - There was an EKG in ED with atrial fib vs sinus tachycardia with PACs  - She is in sinus rhythm on monitor  - Continue cardiac monitoring    DVT prophylaxis: SCDs  Code Status: Full  Level of Care: Level of care: Telemetry Medical Family Communication: Niece at bedside  Disposition Plan:  Patient is from: Home   Anticipated d/c is to: TBD Anticipated d/c date is: TBD based on neurosurgery recommendations  Patient currently: Pending neurosurgery and likely oncology consultations, therapy assessments, disposition planning  Consults called: Neurosurgery  Admission status: Observation     Evalene GORMAN Sprinkles, MD [facility]  09/17/2023, 7:39 PM

## 2023-09-17 NOTE — ED Triage Notes (Addendum)
 The pt was found in the waiting room in a wheelchair no one is with her she has not been able to to tell us why she's here  dont know how she got here.  She's not moving her rt arm

## 2023-09-17 NOTE — ED Provider Notes (Signed)
 [place] EMERGENCY DEPARTMENT AT [facility] Provider Note   CSN: [id number] Arrival date & time: 09/17/23  1656  An emergency department physician performed an initial assessment on this suspected stroke patient at 1759.  History  Chief Complaint  Patient presents with   Tachycardia    Regina [name] is a 58 y.o. female.  This is a 58 year old female presenting emergency department for aphasia.  Initially unclear how patient got to the emergency department as she was found in a wheelchair.  Family later presented to bedside and stated that son brought her and dropped her off to waiting room.  Patient unable to answer questions seemingly has severe aphasia, however can answer yes and no.  She was reportedly normal this morning, but then developed some aphasia some point midmorning around lunch.  Has chronic right upper extremity weakness that has been supposedly present for some time.  Patient is a difficult historian secondary to her aphasia.  Denies chest pain, shortness of breath, no abdominal pain.        Home Medications Prior to Admission medications   Medication Sig Start Date End Date Taking? Authorizing Provider  albuterol  (VENTOLIN  HFA) 108 (90 Base) MCG/ACT inhaler INHALE 2 PUFFS INTO THE LUNGS EVERY 6 (SIX) HOURS AS NEEDED FOR WHEEZING OR SHORTNESS OF BREATH. Patient not taking: Reported on 09/17/2023 06/08/23   Celestia Rosaline SQUIBB, NP  amLODipine  (NORVASC ) 10 MG tablet Take 1 tablet (10 mg total) by mouth daily. Patient not taking: Reported on 09/17/2023 04/19/22   Celestia Rosaline SQUIBB, NP  aspirin  EC 81 MG tablet Take 81 mg by mouth daily. Patient not taking: Reported on 09/17/2023    [provider]  Cholecalciferol (VITAMIN D3) 125 MCG (5000 UT) TABS Take 1 tab ONCE A DAY for vitamin insufficiency Patient not taking: Reported on 09/17/2023 03/25/22     fluticasone  (FLONASE ) 50 MCG/ACT nasal spray Place 2 sprays into both nostrils daily. Patient not  taking: Reported on 09/17/2023 12/24/20   Celestia Rosaline SQUIBB, NP  ibuprofen  (ADVIL ) 600 MG tablet Take 1 tablet (600 mg total) by mouth every 6 (six) hours as needed. Patient not taking: Reported on 09/17/2023 08/09/23   Blaise Aleene KIDD, MD  Multiple Vitamin (MULTI-VITAMIN) tablet Take 1 tablet by mouth daily. Patient not taking: Reported on 09/17/2023 05/28/23     sodium chloride  1 g tablet Take 1 tablet (1 g total) by mouth in the morning and 1 tablet (1 g total) at noon and 1 tablet (1 g total) in the evening. Take with meals. Patient not taking: Reported on 09/17/2023 05/28/23     thiamine  (VITAMIN B1) 100 MG tablet Take 1 tablet (100 mg total) by mouth daily. Patient not taking: Reported on 09/17/2023 05/28/23     traZODone  (DESYREL ) 50 MG tablet Take 1 tablet (50 mg total) by mouth at bedtime as needed. Patient not taking: Reported on 09/17/2023 10/04/22         Allergies    Pork allergy and Bee venom    Review of Systems   Review of Systems  Physical Exam Updated Vital Signs BP (!) 167/94 (BP Location: Right Arm)   Pulse (!) 101   Temp 97.7 F (36.5 C) (Oral)   Resp 15   Ht 5' 6 (1.676 m)   Wt 52.5 kg   LMP 06/06/2010   SpO2 94%   BMI 18.68 kg/m  Physical Exam Vitals and nursing note reviewed.  Constitutional:      General: She is not  in acute distress.    Appearance: She is not toxic-appearing.  HENT:     Head: Normocephalic.     Nose: Nose normal.     Mouth/Throat:     Mouth: Mucous membranes are moist.  Eyes:     Conjunctiva/sclera: Conjunctivae normal.  Cardiovascular:     Rate and Rhythm: Normal rate and regular rhythm.  Pulmonary:     Effort: Pulmonary effort is normal.     Breath sounds: Normal breath sounds.  Abdominal:     General: Abdomen is flat. There is no distension.     Tenderness: There is no abdominal tenderness. There is no guarding or rebound.  Musculoskeletal:     Right lower leg: No edema.     Left lower leg: No edema.  Skin:    General:  Skin is warm and dry.     Capillary Refill: Capillary refill takes less than 2 seconds.  Neurological:     Mental Status: She is alert.     Comments: Patient with some minor right-sided facial droop.  Cranial nerves overlies intact.  Unable to move right upper extremity.  Left upper extremity, and bilateral extremities with normal sensation and movement.  Coordinated movements.  Has severe aphasia can answer yes no.  Unable to name watch or a pen.  Psychiatric:        Mood and Affect: Mood normal.        Behavior: Behavior normal.     ED Results / Procedures / Treatments   Labs (all labs ordered are listed, but only abnormal results are displayed) Labs Reviewed  BASIC METABOLIC PANEL - Abnormal; Notable for the following components:      Result Value   Sodium 134 (*)    CO2 18 (*)    All other components within normal limits  I-STAT CHEM 8, ED - Abnormal; Notable for the following components:   Potassium 3.1 (*)    Hemoglobin 16.3 (*)    HCT 48.0 (*)    All other components within normal limits  TROPONIN I (HIGH SENSITIVITY) - Abnormal; Notable for the following components:   Troponin I (High Sensitivity) 18 (*)    All other components within normal limits  CBC  MAGNESIUM   APTT  ETHANOL  APTT  RAPID URINE DRUG SCREEN, HOSP PERFORMED  URINALYSIS, ROUTINE W REFLEX MICROSCOPIC  HIV ANTIBODY (ROUTINE TESTING W REFLEX)  CBC  BASIC METABOLIC PANEL  HEPATIC FUNCTION PANEL  CBG MONITORING, ED  TROPONIN I (HIGH SENSITIVITY)    EKG EKG Interpretation Date/Time:  Saturday September 17 2023 17:11:24 EST Ventricular Rate:  154 PR Interval:    QRS Duration:  94 QT Interval:  300 QTC Calculation: 480 R Axis:   84  Text Interpretation: Atrial fibrillation with rapid ventricular response Nonspecific ST abnormality Abnormal ECG When compared with ECG of 17-Sep-2023 17:10, PREVIOUS ECG IS PRESENT Confirmed by Neysa Clap [phone]) on 09/17/2023 5:21:07 PM  Radiology CT CHEST ABDOMEN  PELVIS W CONTRAST Result Date: 09/17/2023 CLINICAL DATA:  Left frontal lobe mass, possible metastatic lesion, assessment for other metastatic disease or primary. * Tracking Code: BO * EXAM: CT CHEST, ABDOMEN, AND PELVIS WITH CONTRAST TECHNIQUE: Multidetector CT imaging of the chest, abdomen and pelvis was performed following the standard protocol during bolus administration of intravenous contrast. RADIATION DOSE REDUCTION: This exam was performed according to the departmental dose-optimization program which includes automated exposure control, adjustment of the mA and/or kV according to patient size and/or use of iterative reconstruction technique. CONTRAST:  60mL OMNIPAQUE  IOHEXOL  350 MG/ML SOLN COMPARISON:  05/25/2023 FINDINGS: CT CHEST FINDINGS Cardiovascular: Mild atheromatous vascular calcification of the thoracic aorta. Mediastinum/Nodes: Pathologic right supraclavicular, right paratracheal, bilateral hilar, subcarinal, and bilateral infrahilar adenopathy. Index right paratracheal node 1.4 cm in short axis on image 17 series 3. Index left hilar node 1.3 cm in short axis on image 26 series 3. Lungs/Pleura: Substantial biapical pleuroparenchymal scarring. This has an asymmetric nodular component in the right upper lobe measuring 2.9 by 1.5 by 1.8 cm (volume = 4.1 cm^3) which could be from scarring or tumor. Extensive emphysema.  Honeycombing at the lung bases. Musculoskeletal: Unremarkable CT ABDOMEN PELVIS FINDINGS Hepatobiliary: Unremarkable Pancreas: Unremarkable Spleen: Unremarkable Adrenals/Urinary Tract: There is dense contrast in the renal collecting systems, ureters, and urinary bladder due to recent injection for the CT angiogram. This lowers sensitivity for nonobstructive calculi. No renal or adrenal mass. No obvious abnormal filling defect along the urothelium. Stomach/Bowel: Mild prominence of stool in the rectal vault. Normal appendix. Otherwise unremarkable. Vascular/Lymphatic: Atherosclerosis is  present, including aortoiliac atherosclerotic disease. Reproductive: Posterior positioning of the uterus with suspected uterine fibroids and associated calcifications. Otherwise unremarkable. Other: No supplemental non-categorized findings. Musculoskeletal: Unremarkable IMPRESSION: 1. Pathologic right supraclavicular, right paratracheal, bilateral hilar, subcarinal, and bilateral infrahilar adenopathy. 2. Substantial biapical pleuroparenchymal scarring. This has an asymmetric nodular component in the right upper lobe measuring 2.9 by 1.5 by 1.8 cm (volume = 4.1 cm^ 3) which could be from scarring or tumor. PET-CT could help differentiate. 3. Extensive emphysema. 4. Honeycombing at the lung bases, compatible with interstitial lung disease. 5. Mild prominence of stool in the rectal vault. 6. Posterior positioning of the uterus with suspected uterine fibroids and associated calcifications. 7. Aortic atherosclerosis. Aortic Atherosclerosis (ICD10-I70.0) and Emphysema (ICD10-J43.9). Electronically Signed   By: Ryan Salvage M.D.   On: 09/17/2023 19:23   CT ANGIO HEAD NECK W WO CM (CODE STROKE) Addendum Date: 09/17/2023 ADDENDUM REPORT: 09/17/2023 19:02 ADDENDUM: Delayed images demonstrate peripheral enhancement the mass measuring 2.6 x 2.3 x 2.1 cm. Other foci of enhancement are present. This most likely represents a solitary metastasis. Electronically Signed   By: Lonni Necessary M.D.   On: 09/17/2023 19:02   Result Date: 09/17/2023 CLINICAL DATA:  Code stroke.  Left frontal lobe mass. EXAM: CT ANGIOGRAPHY HEAD AND NECK WITH AND WITHOUT CONTRAST TECHNIQUE: Multidetector CT imaging of the head and neck was performed using the standard protocol during bolus administration of intravenous contrast. Multiplanar CT image reconstructions and MIPs were obtained to evaluate the vascular anatomy. Carotid stenosis measurements (when applicable) are obtained utilizing NASCET criteria, using the distal internal  carotid diameter as the denominator. RADIATION DOSE REDUCTION: This exam was performed according to the departmental dose-optimization program which includes automated exposure control, adjustment of the mA and/or kV according to patient size and/or use of iterative reconstruction technique. CONTRAST:  75mL OMNIPAQUE  IOHEXOL  350 MG/ML SOLN COMPARISON:  CT head without contrast 09/17/2023 FINDINGS: CTA NECK FINDINGS Aortic arch: A 3 vessel arch configuration is present. The aortic arch is incompletely imaged on this study. No significant focal stenosis is present the great vessel origins. Right carotid system: Atherosclerotic calcifications are present at the right carotid bifurcation predominantly involving the external carotid artery. The right common carotid artery is normal. The cervical right ICA is otherwise normal. Left carotid system: The left common carotid artery is within normal limits. Mild noncalcified plaque is present posteriorly at the bifurcation without significant stenosis. The cervical left ICA is otherwise normal. Vertebral arteries:  The left vertebral artery is dominant vessel. Both vertebral arteries originate from the subclavian arteries without significant stenosis. No significant stenosis is present in either vertebral artery in the neck. Skeleton: Vertebral body heights and alignment are normal. No focal osseous lesions are present. The patient is edentulous. Other neck: Soft tissues the neck are otherwise unremarkable. Salivary glands are within normal limits. Thyroid  is normal. No significant adenopathy is present. No focal mucosal or submucosal lesions are present. Upper chest: Central and paraseptal emphysematous changes are present. Peripheral lung nodules are present in the right upper lobe. Please see dedicated CT of the chest for further description. No pneumothorax is present. Paratracheal adenopathy is present. Review of the MIP images confirms the above findings CTA HEAD FINDINGS  Anterior circulation: Internal carotid arteries are within normal limits the skull base to the ICA termini. The A1 and M1 segments are normal. Anterior communicating artery is patent. Mass effect deviates the left MCA and bilateral ACA branch vessels. No significant proximal stenosis or occlusion is present. Posterior circulation: The PICA origins are visualized and normal. Scratched at the left PICA origin is visualized and normal. The right PICA scratched at the hypoplastic right vertebral artery terminates at the PICA. The basilar artery is normal. The superior cerebellar arteries are patent. Posterior cerebral arteries originate from basilar tip. A right posterior communicating artery contributes. The PCA branch vessels are normal bilaterally. No aneurysm is present. Venous sinuses: The dural sinuses are patent. The straight sinus and deep cerebral veins are intact. Cortical veins are within normal limits. No significant vascular malformation is evident. Anatomic variants: None Review of the MIP images confirms the above findings IMPRESSION: 1. No emergent large vessel occlusion. 2. Mass effect deviates the left MCA and bilateral ACA branch vessels without significant proximal stenosis or occlusion. 3. Mild atherosclerotic changes at the carotid bifurcations bilaterally without significant stenosis. 4. Peripheral lung nodules in the right upper lobe. Please see dedicated CT of the chest for further description. 5. Paratracheal adenopathy is related to metastatic disease. Please see dedicated CT of the chest for further description. 6.  Emphysema (ICD10-J43.9). Electronically Signed: By: Lonni Necessary M.D. On: 09/17/2023 18:53   CT HEAD CODE STROKE WO CONTRAST Result Date: 09/17/2023 CLINICAL DATA:  Code stroke. Neuro deficit, acute, stroke suspected. EXAM: CT HEAD WITHOUT CONTRAST TECHNIQUE: Contiguous axial images were obtained from the base of the skull through the vertex without intravenous  contrast. RADIATION DOSE REDUCTION: This exam was performed according to the departmental dose-optimization program which includes automated exposure control, adjustment of the mA and/or kV according to patient size and/or use of iterative reconstruction technique. COMPARISON:  None Available. FINDINGS: Brain: A left frontal lobe mass measures 20 x 20 x 18 mm. Extensive surrounding vasogenic edema present throughout the left frontal lobe. Mass effect is present with effacement of the sulci and 8 mm of left to right midline shift. Asymmetric effacement of the left lateral ventricle is noted. No other discrete lesions are present. No acute cortical infarcts are present. The brainstem and cerebellum are within normal limits. Midline structures are within normal limits. Vascular: No hyperdense vessel or unexpected calcification. Skull: Calvarium is intact. No focal lytic or blastic lesions are present. No significant extracranial soft tissue lesion is present. Sinuses/Orbits: The paranasal sinuses and mastoid air cells are clear. The globes and orbits are within normal limits. IMPRESSION: 1. 20 x 20 x 18 mm left frontal lobe mass with extensive surrounding vasogenic edema. This is concerning for a primary  or metastatic neoplasm. Recommend MRI the brain without and with contrast for further evaluation. 2. Mass effect with effacement of the sulci and 8 mm of left to right midline shift. 3. No other discrete lesions are present. 4. No other acute intracranial abnormality. The above was relayed via text pager to Dr. ELIGIO LAV on 09/17/2023 at 18:25 . Electronically Signed   By: Lonni Necessary M.D.   On: 09/17/2023 18:25   DG Chest Portable 1 View Result Date: 09/17/2023 CLINICAL DATA:  Tachycardia. EXAM: PORTABLE CHEST 1 VIEW COMPARISON:  Radiograph 05/25/2023 FINDINGS: The heart is normal in size. Stable mediastinal contours. Chronic hyperinflation and emphysema. There is biapical pleuroparenchymal scarring,  similar. Reticular opacities at both lung bases are without significant interval change, typical of honeycombing. No definite acute airspace disease. No features of pulmonary edema. No pneumothorax or significant pleural effusion. IMPRESSION: 1. Chronic hyperinflation and emphysema. 2. Bibasilar reticular opacities, typical of honeycombing, without significant interval change. 3. Biapical pleuroparenchymal scarring. Electronically Signed   By: Andrea Gasman M.D.   On: 09/17/2023 18:02    Procedures .Critical Care  Performed by: Neysa Caron PARAS, DO Authorized by: Neysa Caron PARAS, DO   Critical care provider statement:    Critical care time (minutes):  30   Critical care was time spent personally by me on the following activities:  Development of treatment plan with patient or surrogate, discussions with consultants, evaluation of patient's response to treatment, examination of patient, ordering and review of laboratory studies, ordering and review of radiographic studies, ordering and performing treatments and interventions, pulse oximetry, re-evaluation of patient's condition and review of old charts     Medications Ordered in ED Medications  dexamethasone  (DECADRON ) injection 4 mg (has no administration in time range)  levETIRAcetam  (KEPPRA ) IVPB 500 mg/100 mL premix (500 mg Intravenous New Bag/Given 09/17/23 2008)  sodium chloride  flush (NS) 0.9 % injection 3 mL (has no administration in time range)  acetaminophen  (TYLENOL ) tablet 650 mg (has no administration in time range)    Or  acetaminophen  (TYLENOL ) suppository 650 mg (has no administration in time range)  bisacodyl  (DULCOLAX) suppository 10 mg (has no administration in time range)  prochlorperazine  (COMPAZINE ) injection 5 mg (has no administration in time range)  LORazepam  (ATIVAN ) injection 1 mg (has no administration in time range)  ipratropium-albuterol  (DUONEB) 0.5-2.5 (3) MG/3ML nebulizer solution 3 mL (has no administration  in time range)  magnesium  sulfate IVPB 1 g 100 mL (has no administration in time range)  potassium chloride  10 mEq in 100 mL IVPB (10 mEq Intravenous New Bag/Given 09/17/23 2007)  0.9 %  sodium chloride  infusion ( Intravenous New Bag/Given 09/17/23 1952)  labetalol  (NORMODYNE ) injection 10 mg (has no administration in time range)  HYDROmorphone  (DILAUDID ) injection 0.5 mg (has no administration in time range)  sodium chloride  0.9 % bolus 1,000 mL (0 mLs Intravenous Stopped 09/17/23 1941)  iohexol  (OMNIPAQUE ) 350 MG/ML injection 75 mL (75 mLs Intravenous Contrast Given 09/17/23 1820)  iohexol  (OMNIPAQUE ) 350 MG/ML injection 60 mL (60 mLs Intravenous Contrast Given 09/17/23 1829)  dexamethasone  (DECADRON ) injection 10 mg (10 mg Intravenous Given 09/17/23 1932)    ED Course/ Medical Decision Making/ A&P Clinical Course as of 09/17/23 2033  Sat Sep 17, 2023  1834 CT HEAD CODE STROKE WO CONTRAST IMPRESSION: 1. 20 x 20 x 18 mm left frontal lobe mass with extensive surrounding vasogenic edema. This is concerning for a primary or metastatic neoplasm. Recommend MRI the brain without and with contrast for  further evaluation. 2. Mass effect with effacement of the sulci and 8 mm of left to right midline shift. 3. No other discrete lesions are present. 4. No other acute intracranial abnormality.  The above was relayed via text pager to Dr. ELIGIO LAV on   [TY]  [phone] Spoke with neurosurgery recommending Decadron  10 mg and 500 mg Keppra .  Will see patient. [TY]    Clinical Course User Index [TY] Neysa Caron PARAS, DO                                 Medical Decision Making Is a 58 year old female presenting emergency department for aphasia.  She is tachycardic, appears to be in A-fib.  Hypertensive.  Maintaining oxygen saturation on room air.  Given patient's severe aphasia which is seemingly new activated as a code stroke as she was demonstrating signs of LVO occlusion.  However not a candidate for  TNK given onset of symptoms.  CT head with mass with edema.  Neurology consulted.  Neurosurgery consulted.  Given Decadron  and Keppra .  Her CBC with no leukocytosis to suggest systemic infection.  Basic metabolic panel with no significant metabolic derangements.  Normal kidney function.  Troponin with mild elevation, but EKG without ST segment changes to indicate ischemia on my independent interpretation.  Magnesium  levels normal.  Ethanol level not elevated.  Neurology with suspicion that this is a metastatic lesion.  Ordered chest abdomen pelvis; appears may have lesions in lungs.  MRI ordered and pending.  Plan to admit for further workup.  Amount and/or Complexity of Data Reviewed Independent Historian:     Details: Called niece they were unsure when patient last known normal was, has not seen them or spoken to them since New Year's Eve External Data Reviewed:     Details: Does have history of alcohol  abuse.  No documented cancer history Labs: ordered. Decision-making details documented in ED Course. Radiology: ordered. Decision-making details documented in ED Course. Discussion of management or test interpretation with external provider(s): Case discussed with neurology, neurosurgery and hospitalist  Risk Prescription drug management. Decision regarding hospitalization.         Final Clinical Impression(s) / ED Diagnoses Final diagnoses:  None    Rx / DC Orders ED Discharge Orders     None         Neysa Caron PARAS, DO 09/17/23 2033

## 2023-09-17 NOTE — Consult Note (Signed)
 Reason for Consult:headache, not feeling well(patient is aphasic, this is what she could tell us ) Referring Physician: Opyd, Timothy  Regina [name] is an 58 y.o. female.  HPI: with a history of tobacco use, and recent difficulty with speech. Brought by her son to the [facility] ED today. Was seen and evaluated by neurology. A CT brain showed a likely mass and the scan with contrast confirmed a intensely enhancing mass in the left frontal lobe with surrounding edema.  Chest, abdomen, and pelvic CT shows abnormal adenopathy in the thoracic cavity.  Started on Keppra ,and decadron .  Past Medical History:  Diagnosis Date   Allergy    Anxiety    COPD (chronic obstructive pulmonary disease) (HCC)    Emphysema of lung (HCC)    Hypertension     Past Surgical History:  Procedure Laterality Date   dental procedure      Family History  Problem Relation Age of Onset   Leukemia Mother    Hypertension Mother    Hypertension Father    Cancer Neg Hx    Diabetes Neg Hx    Heart disease Neg Hx    Colon cancer Neg Hx    Esophageal cancer Neg Hx    Rectal cancer Neg Hx    Stomach cancer Neg Hx     Social History:  reports that she quit smoking about 3 years ago. Her smoking use included cigarettes. She has never used smokeless tobacco. She reports current alcohol  use of about 6.0 standard drinks of alcohol  per week. She reports current drug use. Frequency: 4.00 times per week. Drug: Marijuana.  Allergies:  Allergies  Allergen Reactions   Pork Allergy Nausea And Vomiting   Bee Venom Hives    Medications: I have reviewed the patient's current medications.  Results for orders placed or performed during the hospital encounter of 09/17/23 (from the past 48 hours)  Basic metabolic panel     Status: Abnormal   Collection Time: 09/17/23  5:15 PM  Result Value Ref Range   Sodium 134 (L) 135 - 145 mmol/L   Potassium 3.6 3.5 - 5.1 mmol/L   Chloride 102 98 - 111 mmol/L   CO2 18 (L) 22 - 32 mmol/L    Glucose, Bld 80 70 - 99 mg/dL    Comment: Glucose reference range applies only to samples taken after fasting for at least 8 hours.   BUN 10 6 - 20 mg/dL   Creatinine, Ser 9.26 0.44 - 1.00 mg/dL   Calcium  10.3 8.9 - 10.3 mg/dL   GFR, Estimated >39 >39 mL/min    Comment: (NOTE) Calculated using the CKD-EPI Creatinine Equation (2021)    Anion gap 14 5 - 15    Comment: Performed at [facility], [address] [place], [place] [address]  CBC     Status: None   Collection Time: 09/17/23  5:15 PM  Result Value Ref Range   WBC 9.2 4.0 - 10.5 K/uL   RBC 5.00 3.87 - 5.11 MIL/uL   Hemoglobin 14.9 12.0 - 15.0 g/dL   HCT 56.0 63.9 - 53.9 %   MCV 87.8 80.0 - 100.0 fL   MCH 29.8 26.0 - 34.0 pg   MCHC 33.9 30.0 - 36.0 g/dL   RDW 85.6 88.4 - 84.4 %   Platelets 372 150 - 400 K/uL   nRBC 0.0 0.0 - 0.2 %    Comment: Performed at [facility], [address] [place], [place] [address]  Troponin I (High  Sensitivity)     Status: Abnormal   Collection Time: 09/17/23  5:15 PM  Result Value Ref Range   Troponin I (High Sensitivity) 18 (H) <18 ng/L    Comment: (NOTE) Elevated high sensitivity troponin I (hsTnI) values and significant  changes across serial measurements may suggest ACS but many other  chronic and acute conditions are known to elevate hsTnI results.  Refer to the Links section for chest pain algorithms and additional  guidance. Performed at [facility], [address] [place], [place] [address]   Magnesium      Status: None   Collection Time: 09/17/23  5:15 PM  Result Value Ref Range   Magnesium  1.7 1.7 - 2.4 mg/dL    Comment: Performed at [facility], [address] [place], [place] [address]  APTT     Status: None   Collection Time: 09/17/23  6:00 PM  Result Value Ref Range   aPTT 28 24 - 36 seconds    Comment: Performed at [facility], [address], [place], [place] [address]  Ethanol     Status: None   Collection Time: 09/17/23  6:00  PM  Result Value Ref Range   Alcohol , Ethyl (B) <10 <10 mg/dL    Comment: (NOTE) Lowest detectable limit for serum alcohol  is 10 mg/dL.  For medical purposes only. Performed at [facility], [address], [place], [place] [address]   CBG monitoring, ED     Status: None   Collection Time: 09/17/23  6:02 PM  Result Value Ref Range   Glucose-Capillary 87 70 - 99 mg/dL    Comment: Glucose reference range applies only to samples taken after fasting for at least 8 hours.  I-stat chem 8, ED     Status: Abnormal   Collection Time: 09/17/23  6:10 PM  Result Value Ref Range   Sodium 135 135 - 145 mmol/L   Potassium 3.1 (L) 3.5 - 5.1 mmol/L   Chloride 98 98 - 111 mmol/L   BUN 10 6 - 20 mg/dL   Creatinine, Ser 9.29 0.44 - 1.00 mg/dL   Glucose, Bld 80 70 - 99 mg/dL    Comment: Glucose reference range applies only to samples taken after fasting for at least 8 hours.   Calcium , Ion 1.25 1.15 - 1.40 mmol/L   TCO2 24 22 - 32 mmol/L   Hemoglobin 16.3 (H) 12.0 - 15.0 g/dL   HCT 51.9 (H) 63.9 - 53.9 %    CT CHEST ABDOMEN PELVIS W CONTRAST Result Date: 09/17/2023 CLINICAL DATA:  Left frontal lobe mass, possible metastatic lesion, assessment for other metastatic disease or primary. * Tracking Code: BO * EXAM: CT CHEST, ABDOMEN, AND PELVIS WITH CONTRAST TECHNIQUE: Multidetector CT imaging of the chest, abdomen and pelvis was performed following the standard protocol during bolus administration of intravenous contrast. RADIATION DOSE REDUCTION: This exam was performed according to the departmental dose-optimization program which includes automated exposure control, adjustment of the mA and/or kV according to patient size and/or use of iterative reconstruction technique. CONTRAST:  60mL OMNIPAQUE  IOHEXOL  350 MG/ML SOLN COMPARISON:  05/25/2023 FINDINGS: CT CHEST FINDINGS Cardiovascular: Mild atheromatous vascular calcification of the thoracic aorta. Mediastinum/Nodes: Pathologic right supraclavicular,  right paratracheal, bilateral hilar, subcarinal, and bilateral infrahilar adenopathy. Index right paratracheal node 1.4 cm in short axis on image 17 series 3. Index left hilar node 1.3 cm in short axis on image 26 series 3. Lungs/Pleura: Substantial biapical pleuroparenchymal scarring. This has an  asymmetric nodular component in the right upper lobe measuring 2.9 by 1.5 by 1.8 cm (volume = 4.1 cm^3) which could be from scarring or tumor. Extensive emphysema.  Honeycombing at the lung bases. Musculoskeletal: Unremarkable CT ABDOMEN PELVIS FINDINGS Hepatobiliary: Unremarkable Pancreas: Unremarkable Spleen: Unremarkable Adrenals/Urinary Tract: There is dense contrast in the renal collecting systems, ureters, and urinary bladder due to recent injection for the CT angiogram. This lowers sensitivity for nonobstructive calculi. No renal or adrenal mass. No obvious abnormal filling defect along the urothelium. Stomach/Bowel: Mild prominence of stool in the rectal vault. Normal appendix. Otherwise unremarkable. Vascular/Lymphatic: Atherosclerosis is present, including aortoiliac atherosclerotic disease. Reproductive: Posterior positioning of the uterus with suspected uterine fibroids and associated calcifications. Otherwise unremarkable. Other: No supplemental non-categorized findings. Musculoskeletal: Unremarkable IMPRESSION: 1. Pathologic right supraclavicular, right paratracheal, bilateral hilar, subcarinal, and bilateral infrahilar adenopathy. 2. Substantial biapical pleuroparenchymal scarring. This has an asymmetric nodular component in the right upper lobe measuring 2.9 by 1.5 by 1.8 cm (volume = 4.1 cm^ 3) which could be from scarring or tumor. PET-CT could help differentiate. 3. Extensive emphysema. 4. Honeycombing at the lung bases, compatible with interstitial lung disease. 5. Mild prominence of stool in the rectal vault. 6. Posterior positioning of the uterus with suspected uterine fibroids and associated  calcifications. 7. Aortic atherosclerosis. Aortic Atherosclerosis (ICD10-I70.0) and Emphysema (ICD10-J43.9). Electronically Signed   By: Ryan Salvage M.D.   On: 09/17/2023 19:23   CT ANGIO HEAD NECK W WO CM (CODE STROKE) Addendum Date: 09/17/2023 ADDENDUM REPORT: 09/17/2023 19:02 ADDENDUM: Delayed images demonstrate peripheral enhancement the mass measuring 2.6 x 2.3 x 2.1 cm. Other foci of enhancement are present. This most likely represents a solitary metastasis. Electronically Signed   By: Lonni Necessary M.D.   On: 09/17/2023 19:02   Result Date: 09/17/2023 CLINICAL DATA:  Code stroke.  Left frontal lobe mass. EXAM: CT ANGIOGRAPHY HEAD AND NECK WITH AND WITHOUT CONTRAST TECHNIQUE: Multidetector CT imaging of the head and neck was performed using the standard protocol during bolus administration of intravenous contrast. Multiplanar CT image reconstructions and MIPs were obtained to evaluate the vascular anatomy. Carotid stenosis measurements (when applicable) are obtained utilizing NASCET criteria, using the distal internal carotid diameter as the denominator. RADIATION DOSE REDUCTION: This exam was performed according to the departmental dose-optimization program which includes automated exposure control, adjustment of the mA and/or kV according to patient size and/or use of iterative reconstruction technique. CONTRAST:  75mL OMNIPAQUE  IOHEXOL  350 MG/ML SOLN COMPARISON:  CT head without contrast 09/17/2023 FINDINGS: CTA NECK FINDINGS Aortic arch: A 3 vessel arch configuration is present. The aortic arch is incompletely imaged on this study. No significant focal stenosis is present the great vessel origins. Right carotid system: Atherosclerotic calcifications are present at the right carotid bifurcation predominantly involving the external carotid artery. The right common carotid artery is normal. The cervical right ICA is otherwise normal. Left carotid system: The left common carotid artery is  within normal limits. Mild noncalcified plaque is present posteriorly at the bifurcation without significant stenosis. The cervical left ICA is otherwise normal. Vertebral arteries: The left vertebral artery is dominant vessel. Both vertebral arteries originate from the subclavian arteries without significant stenosis. No significant stenosis is present in either vertebral artery in the neck. Skeleton: Vertebral body heights and alignment are normal. No focal osseous lesions are present. The patient is edentulous. Other neck: Soft tissues the neck are otherwise unremarkable. Salivary glands are within normal limits. Thyroid  is normal. No significant adenopathy is present.  No focal mucosal or submucosal lesions are present. Upper chest: Central and paraseptal emphysematous changes are present. Peripheral lung nodules are present in the right upper lobe. Please see dedicated CT of the chest for further description. No pneumothorax is present. Paratracheal adenopathy is present. Review of the MIP images confirms the above findings CTA HEAD FINDINGS Anterior circulation: Internal carotid arteries are within normal limits the skull base to the ICA termini. The A1 and M1 segments are normal. Anterior communicating artery is patent. Mass effect deviates the left MCA and bilateral ACA branch vessels. No significant proximal stenosis or occlusion is present. Posterior circulation: The PICA origins are visualized and normal. Scratched at the left PICA origin is visualized and normal. The right PICA scratched at the hypoplastic right vertebral artery terminates at the PICA. The basilar artery is normal. The superior cerebellar arteries are patent. Posterior cerebral arteries originate from basilar tip. A right posterior communicating artery contributes. The PCA branch vessels are normal bilaterally. No aneurysm is present. Venous sinuses: The dural sinuses are patent. The straight sinus and deep cerebral veins are intact.  Cortical veins are within normal limits. No significant vascular malformation is evident. Anatomic variants: None Review of the MIP images confirms the above findings IMPRESSION: 1. No emergent large vessel occlusion. 2. Mass effect deviates the left MCA and bilateral ACA branch vessels without significant proximal stenosis or occlusion. 3. Mild atherosclerotic changes at the carotid bifurcations bilaterally without significant stenosis. 4. Peripheral lung nodules in the right upper lobe. Please see dedicated CT of the chest for further description. 5. Paratracheal adenopathy is related to metastatic disease. Please see dedicated CT of the chest for further description. 6.  Emphysema (ICD10-J43.9). Electronically Signed: By: Lonni Necessary M.D. On: 09/17/2023 18:53   CT HEAD CODE STROKE WO CONTRAST Result Date: 09/17/2023 CLINICAL DATA:  Code stroke. Neuro deficit, acute, stroke suspected. EXAM: CT HEAD WITHOUT CONTRAST TECHNIQUE: Contiguous axial images were obtained from the base of the skull through the vertex without intravenous contrast. RADIATION DOSE REDUCTION: This exam was performed according to the departmental dose-optimization program which includes automated exposure control, adjustment of the mA and/or kV according to patient size and/or use of iterative reconstruction technique. COMPARISON:  None Available. FINDINGS: Brain: A left frontal lobe mass measures 20 x 20 x 18 mm. Extensive surrounding vasogenic edema present throughout the left frontal lobe. Mass effect is present with effacement of the sulci and 8 mm of left to right midline shift. Asymmetric effacement of the left lateral ventricle is noted. No other discrete lesions are present. No acute cortical infarcts are present. The brainstem and cerebellum are within normal limits. Midline structures are within normal limits. Vascular: No hyperdense vessel or unexpected calcification. Skull: Calvarium is intact. No focal lytic or blastic  lesions are present. No significant extracranial soft tissue lesion is present. Sinuses/Orbits: The paranasal sinuses and mastoid air cells are clear. The globes and orbits are within normal limits. IMPRESSION: 1. 20 x 20 x 18 mm left frontal lobe mass with extensive surrounding vasogenic edema. This is concerning for a primary or metastatic neoplasm. Recommend MRI the brain without and with contrast for further evaluation. 2. Mass effect with effacement of the sulci and 8 mm of left to right midline shift. 3. No other discrete lesions are present. 4. No other acute intracranial abnormality. The above was relayed via text pager to Dr. ELIGIO LAV on 09/17/2023 at 18:25 . Electronically Signed   By: Lonni Necessary HERO.D.  On: 09/17/2023 18:25   DG Chest Portable 1 View Result Date: 09/17/2023 CLINICAL DATA:  Tachycardia. EXAM: PORTABLE CHEST 1 VIEW COMPARISON:  Radiograph 05/25/2023 FINDINGS: The heart is normal in size. Stable mediastinal contours. Chronic hyperinflation and emphysema. There is biapical pleuroparenchymal scarring, similar. Reticular opacities at both lung bases are without significant interval change, typical of honeycombing. No definite acute airspace disease. No features of pulmonary edema. No pneumothorax or significant pleural effusion. IMPRESSION: 1. Chronic hyperinflation and emphysema. 2. Bibasilar reticular opacities, typical of honeycombing, without significant interval change. 3. Biapical pleuroparenchymal scarring. Electronically Signed   By: Andrea Gasman M.D.   On: 09/17/2023 18:02    Review of Systems  Neurological:  Positive for speech difficulty, weakness and headaches.   Blood pressure (!) 167/94, pulse (!) 101, temperature 97.7 F (36.5 C), temperature source Oral, resp. rate 15, height 5' 6 (1.676 m), weight 52.5 kg, last menstrual period 06/06/2010, SpO2 94%. Physical Exam Constitutional:      General: She is not in acute distress.    Appearance: Normal  appearance.  HENT:     Head: Normocephalic and atraumatic.     Right Ear: External ear normal.     Left Ear: External ear normal.     Nose: Nose normal.     Mouth/Throat:     Mouth: Mucous membranes are moist.     Pharynx: Oropharynx is clear.  Eyes:     Extraocular Movements: Extraocular movements intact.     Pupils: Pupils are equal, round, and reactive to light.  Cardiovascular:     Rate and Rhythm: Normal rate and regular rhythm.  Pulmonary:     Effort: Pulmonary effort is normal.  Abdominal:     General: Abdomen is flat.  Musculoskeletal:     Cervical back: Normal range of motion.  Neurological:     Mental Status: She is alert and oriented to person, place, and time.     Cranial Nerves: Cranial nerve deficit present.     Motor: Weakness and pronator drift present.     Coordination: Coordination abnormal.     Deep Tendon Reflexes: Babinski sign absent on the right side. Babinski sign absent on the left side.     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 Will follow some commands, mimics to follow other commands Non fluent speech, mild dysarthria Unable to do detailed sensory, and motor exam secondary to aphasia. Gait not assessed     Assessment/Plan: Regina [name] is a 58 y.o. 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 Believe best option is to biopsy one of the abnormal nodes to provide a diagnosis.  Pathology dependant decisions can then be made.  Rockey Peru 09/17/2023, 8:06 PM

## 2023-09-17 NOTE — Progress Notes (Signed)
 Techs unable to access patient for EEG at this time due to having a stat CT. Will try back later as schedule permits.

## 2023-09-17 NOTE — ED Notes (Signed)
 CCMD notified via telephone.

## 2023-09-17 NOTE — Consult Note (Signed)
 NEUROLOGY CONSULT NOTE   Date of service: September 17, 2023 Patient Name: Regina [name] MRN:  [id number] DOB:  10-Nov-1965 Chief Complaint: Aphasia Requesting Provider: Neysa Caron PARAS, DO  History of Present Illness  Regina [name] is a 58 y.o. female  has a past medical history of Allergy, Anxiety, COPD (chronic obstructive pulmonary disease) (HCC), Emphysema of lung (HCC), and Hypertension.   I was unable to provide history was in the ED, unclear how she got there, unclear how she got registered but when the ED provider examined her, she is sounded aphasic.  I was called to discuss this case.  I was told she has right facial weakness and right arm weakness, arm weakness may be old.  She definitely was aphasic for the ED provider on exam.  There was some information from somewhere that the last known well might have been noon.  I asked him to activate a code stroke because of the facial weakness, aphasia-LVO positive being within the 24-hour time window. Patient examined, stat CT head done concerning for a brain mass with vasogenic edema.    LKW: Unclear Modified rankin score: Unclear IV Thrombolysis: No-brain mass EVT: No-brain mass  NIHSS components Score: Comment  1a Level of Conscious 0[x]  1[]  2[]  3[]      1b LOC Questions 0[]  1[]  2[x]       1c LOC Commands 0[x]  1[]  2[]       2 Best Gaze 0[x]  1[]  2[]       3 Visual 0[x]  1[]  2[]  3[]      4 Facial Palsy 0[]  1[x]  2[]  3[]      5a Motor Arm - left 0[x]  1[]  2[]  3[]  4[]  UN[]    5b Motor Arm - Right 0[]  1[]  2[]  3[x]  4[]  UN[]    6a Motor Leg - Left 0[x]  1[]  2[]  3[]  4[]  UN[]    6b Motor Leg - Right 0[x]  1[]  2[]  3[]  4[]  UN[]    7 Limb Ataxia 0[x]  1[]  2[]  3[]  UN[]     8 Sensory 0[x]  1[]  2[]  UN[]      9 Best Language 0[]  1[]  2[x]  3[]      10 Dysarthria 0[x]  1[]  2[]  UN[]      11 Extinct. and Inattention 0[x]  1[]  2[]       TOTAL: 8      ROS  Unable to ascertain due to aphasia  Past History   Past Medical History:  Diagnosis Date   Allergy     Anxiety    COPD (chronic obstructive pulmonary disease) (HCC)    Emphysema of lung (HCC)    Hypertension     Past Surgical History:  Procedure Laterality Date   dental procedure      Family History: Family History  Problem Relation Age of Onset   Leukemia Mother    Hypertension Mother    Hypertension Father    Cancer Neg Hx    Diabetes Neg Hx    Heart disease Neg Hx    Colon cancer Neg Hx    Esophageal cancer Neg Hx    Rectal cancer Neg Hx    Stomach cancer Neg Hx     Social History  reports that she quit smoking about 3 years ago. Her smoking use included cigarettes. She has never used smokeless tobacco. She reports current alcohol  use of about 6.0 standard drinks of alcohol  per week. She reports current drug use. Frequency: 4.00 times per week. Drug: Marijuana.  Allergies  Allergen Reactions   Pork Allergy Nausea And Vomiting   Bee Venom Hives    Medications  Current Facility-Administered Medications:    dexamethasone  (DECADRON ) injection 10 mg, 10 mg, Intravenous, Once, Young, Travis J, DO   levETIRAcetam  (KEPPRA ) IVPB 500 mg/100 mL premix, 500 mg, Intravenous, Once, Young, Travis J, DO  Current Outpatient Medications:    albuterol  (VENTOLIN  HFA) 108 (90 Base) MCG/ACT inhaler, INHALE 2 PUFFS INTO THE LUNGS EVERY 6 (SIX) HOURS AS NEEDED FOR WHEEZING OR SHORTNESS OF BREATH., Disp: 18 g, Rfl: 1   amLODipine  (NORVASC ) 10 MG tablet, Take 1 tablet (10 mg total) by mouth daily., Disp: 90 tablet, Rfl: 1   aspirin  EC 81 MG tablet, Take 81 mg by mouth daily., Disp: , Rfl:    Cholecalciferol (VITAMIN D3) 125 MCG (5000 UT) TABS, Take 1 tab ONCE A DAY for vitamin insufficiency, Disp: 15 tablet, Rfl: 0   fluticasone  (FLONASE ) 50 MCG/ACT nasal spray, Place 2 sprays into both nostrils daily. (Patient taking differently: Place 2 sprays into both nostrils daily as needed for allergies.), Disp: 16 g, Rfl: 6   ibuprofen  (ADVIL ) 600 MG tablet, Take 1 tablet (600 mg total) by mouth every  6 (six) hours as needed., Disp: 30 tablet, Rfl: 0   Multiple Vitamin (MULTI-VITAMIN) tablet, Take 1 tablet by mouth daily., Disp: 100 tablet, Rfl: 0   sodium chloride  1 g tablet, Take 1 tablet (1 g total) by mouth in the morning and 1 tablet (1 g total) at noon and 1 tablet (1 g total) in the evening. Take with meals., Disp: 270 tablet, Rfl: 0   thiamine  (VITAMIN B1) 100 MG tablet, Take 1 tablet (100 mg total) by mouth daily., Disp: 100 tablet, Rfl: 0   traZODone  (DESYREL ) 50 MG tablet, Take 1 tablet (50 mg total) by mouth at bedtime as needed., Disp: 15 tablet, Rfl: 0  Vitals   Vitals:   10/03/2023 1745 10-03-23 1811 10-03-2023 1815 October 03, 2023 1825  BP: (!) 159/108 (!) 178/114 (!) 162/95 (!) 167/94  Pulse: (!) 111 (!) 103 99 (!) 101  Resp: 20 15 14 15   Temp:      TempSrc:      SpO2: 97% 97% 96% 94%  Weight:      Height:        Body mass index is 18.68 kg/m.  Physical Exam   General: Well-developed well-nourished no acute distress HEENT: Normocephalic and atraumatic CVS: Regular rhythm Neurological exam Awake alert able to nod yes and no and say yes and no but no other verbal output. Follows commands Unable to repeat Unable to name Cranial nerves: Pupils equal round react light, extraocular movements intact, visual fields full, facial sensation intact, face asymmetric with right lower facial weakness, tongue and palate midline. Motor examination with right upper extremity 2/5.  Rest of the extremity is nearly 5/5 and symmetric. Sensation intact to light touch Coordination examination reveals no dysmetria  Labs/Imaging/Neurodiagnostic studies   CBC:  Recent Labs  Lab 10/03/23 1715 2023/10/03 1810  WBC 9.2  --   HGB 14.9 16.3*  HCT 43.9 48.0*  MCV 87.8  --   PLT 372  --    Basic Metabolic Panel:  Lab Results  Component Value Date   NA 135 October 03, 2023   K 3.1 (L) 2023/10/03   CO2 18 (L) 10-03-2023   GLUCOSE 80 10-03-23   BUN 10 2023-10-03   CREATININE 0.70 2023/10/03    CALCIUM  10.3 10-03-2023   GFRNONAA >60 Oct 03, 2023   GFRAA >60 01/30/2020   Urine Drug Screen:     Component Value Date/Time   LABOPIA NONE DETECTED  01/30/2020 0948   COCAINSCRNUR NONE DETECTED 01/30/2020 0948   LABBENZ NONE DETECTED 01/30/2020 0948   AMPHETMU NONE DETECTED 01/30/2020 0948   THCU POSITIVE (A) 01/30/2020 0948   LABBARB NONE DETECTED 01/30/2020 0948    Alcohol  Level     Component Value Date/Time   ETH <10 09/17/2023 1800   INR  Lab Results  Component Value Date   INR 1.0 07/16/2019   APTT  Lab Results  Component Value Date   APTT 28 09/17/2023   AED levels: No results found for: PHENYTOIN, ZONISAMIDE, LAMOTRIGINE, LEVETIRACETA  CT Head without contrast(Personally reviewed): Left hemispheric extensive vasogenic edema with a 1.8 x 2 x 2 mass in the left frontal lobe.  No bleed.  CT angio Head and Neck with contrast(Personally reviewed): No ELVO.  Region identified on CT shows regular margin enhancement with some enhancement in the marrow.  Likely metastasis Formal read pending    ASSESSMENT   Devita Nies is a 58 y.o. female  has a past medical history of Allergy, Anxiety, COPD (chronic obstructive pulmonary disease) (HCC), Emphysema of lung (HCC), and Hypertension.  Presented with aphasia-unclear last known well.  No family member at bedside.  ED contacted the family member listed on the chart who reports last normal New Year's Day. CT head with severe vasogenic edema in the left hemisphere with a 1.8 x 2.2 cm mass millimeter midline shift in the left frontal lobe.   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 Neuro surgical and oncological consultations for the  mass Routine EEG Inpatient neurology will be available as needed  Plan discussed with Dr. Lisbeth ______________________________________________________________________    Signed, Eligio Lav, MD [facility]

## 2023-09-17 NOTE — Progress Notes (Signed)
 EEG complete - results pending

## 2023-09-17 NOTE — ED Triage Notes (Signed)
 The pt only  answers with a yes and a no to questons asked and smiles no one has  come in to ask about her   p. 150

## 2023-09-17 NOTE — Progress Notes (Signed)
 Orthopedic Tech Progress Note Patient Details:  Regina [name] [birth year] [id number]  Ortho Devices Type of Ortho Device: Sling immobilizer Ortho Device/Splint Location: rue Ortho Device/Splint Interventions: Ordered, Application, Adjustment   Post Interventions Patient Tolerated: Well Instructions Provided: Care of device, Adjustment of device  Chandra Dorn PARAS 09/17/2023, 9:46 PM

## 2023-09-17 NOTE — Code Documentation (Signed)
 Stroke Response Nurse Documentation Code Documentation  Regina [name] is a 58 y.o. female arriving to [facility]  via [employer] on 09/17/2023 with past medical hx of anxiety, COPD, HTN. On aspirin  81 mg daily. Code stroke was activated by ED.   Patient from home where she has an unclear LKW and now complaining of aphasia and facial weakness.   Stroke team at the bedside on patient arrival. Labs drawn and patient cleared for CT by Dr. neysa. Patient to CT with team.   NIHSS 7, see documentation for details and code stroke times. Patient with disoriented, right facial droop, right arm weakness, and Expressive aphasia  on exam.   The following imaging was completed:  CT Head and CTA.   Patient is not a candidate for IV Thrombolytic due to LKW unclear. Patient is not a candidate for IR due to imaging negative for LVO.   Care Plan: code stroke cancelled 1850.   Bedside handoff with ED RN Katrina.    Aris Even L Lynk Marti  Rapid Response RN

## 2023-09-17 NOTE — ED Notes (Signed)
 Code stroke activated. Dr.Arora came in and rolled to CT 4 with stroke team.

## 2023-09-18 DIAGNOSIS — Z7982 Long term (current) use of aspirin: Secondary | ICD-10-CM | POA: Diagnosis not present

## 2023-09-18 DIAGNOSIS — M84521A Pathological fracture in neoplastic disease, right humerus, initial encounter for fracture: Secondary | ICD-10-CM | POA: Diagnosis present

## 2023-09-18 DIAGNOSIS — Z8249 Family history of ischemic heart disease and other diseases of the circulatory system: Secondary | ICD-10-CM | POA: Diagnosis not present

## 2023-09-18 DIAGNOSIS — G9389 Other specified disorders of brain: Secondary | ICD-10-CM | POA: Diagnosis not present

## 2023-09-18 DIAGNOSIS — G936 Cerebral edema: Secondary | ICD-10-CM | POA: Diagnosis not present

## 2023-09-18 DIAGNOSIS — F1010 Alcohol abuse, uncomplicated: Secondary | ICD-10-CM | POA: Diagnosis not present

## 2023-09-18 DIAGNOSIS — R2231 Localized swelling, mass and lump, right upper limb: Secondary | ICD-10-CM | POA: Diagnosis not present

## 2023-09-18 DIAGNOSIS — R471 Dysarthria and anarthria: Secondary | ICD-10-CM | POA: Diagnosis not present

## 2023-09-18 DIAGNOSIS — Z91030 Bee allergy status: Secondary | ICD-10-CM | POA: Diagnosis not present

## 2023-09-18 DIAGNOSIS — R000 Tachycardia, unspecified: Secondary | ICD-10-CM | POA: Diagnosis not present

## 2023-09-18 DIAGNOSIS — Z806 Family history of leukemia: Secondary | ICD-10-CM | POA: Diagnosis not present

## 2023-09-18 DIAGNOSIS — I10 Essential (primary) hypertension: Secondary | ICD-10-CM | POA: Diagnosis not present

## 2023-09-18 DIAGNOSIS — D430 Neoplasm of uncertain behavior of brain, supratentorial: Secondary | ICD-10-CM | POA: Diagnosis not present

## 2023-09-18 DIAGNOSIS — G8191 Hemiplegia, unspecified affecting right dominant side: Secondary | ICD-10-CM | POA: Diagnosis not present

## 2023-09-18 DIAGNOSIS — R29810 Facial weakness: Secondary | ICD-10-CM | POA: Diagnosis not present

## 2023-09-18 DIAGNOSIS — Z79899 Other long term (current) drug therapy: Secondary | ICD-10-CM | POA: Diagnosis not present

## 2023-09-18 DIAGNOSIS — C3411 Malignant neoplasm of upper lobe, right bronchus or lung: Secondary | ICD-10-CM | POA: Diagnosis not present

## 2023-09-18 DIAGNOSIS — G935 Compression of brain: Secondary | ICD-10-CM | POA: Diagnosis present

## 2023-09-18 DIAGNOSIS — R569 Unspecified convulsions: Secondary | ICD-10-CM | POA: Diagnosis not present

## 2023-09-18 DIAGNOSIS — Z91014 Allergy to mammalian meats: Secondary | ICD-10-CM | POA: Diagnosis not present

## 2023-09-18 DIAGNOSIS — Z87891 Personal history of nicotine dependence: Secondary | ICD-10-CM | POA: Diagnosis not present

## 2023-09-18 DIAGNOSIS — R4701 Aphasia: Secondary | ICD-10-CM | POA: Diagnosis not present

## 2023-09-18 DIAGNOSIS — J439 Emphysema, unspecified: Secondary | ICD-10-CM | POA: Diagnosis not present

## 2023-09-18 DIAGNOSIS — C7951 Secondary malignant neoplasm of bone: Secondary | ICD-10-CM | POA: Diagnosis not present

## 2023-09-18 LAB — HEPATIC FUNCTION PANEL
ALT: 13 U/L (ref 0–44)
AST: 23 U/L (ref 15–41)
Albumin: 3.6 g/dL (ref 3.5–5.0)
Alkaline Phosphatase: 98 U/L (ref 38–126)
Bilirubin, Direct: <0.1 mg/dL (ref 0.0–0.2)
Total Bilirubin: 0.8 mg/dL (ref 0.0–1.2)
Total Protein: 7.2 g/dL (ref 6.5–8.1)

## 2023-09-18 LAB — BASIC METABOLIC PANEL
Anion gap: 13 (ref 5–15)
BUN: 8 mg/dL (ref 6–20)
CO2: 19 mmol/L — ABNORMAL LOW (ref 22–32)
Calcium: 9.2 mg/dL (ref 8.9–10.3)
Chloride: 103 mmol/L (ref 98–111)
Creatinine, Ser: 0.71 mg/dL (ref 0.44–1.00)
GFR, Estimated: >60 mL/min (ref >60)
Glucose, Bld: 116 mg/dL — ABNORMAL HIGH (ref 70–99)
Potassium: 4.3 mmol/L (ref 3.5–5.1)
Sodium: 135 mmol/L (ref 135–145)

## 2023-09-18 LAB — CBC
HCT: 43 % (ref 36.0–46.0)
Hemoglobin: 14.6 g/dL (ref 12.0–15.0)
MCH: 29.7 pg (ref 26.0–34.0)
MCHC: 34 g/dL (ref 30.0–36.0)
MCV: 87.6 fL (ref 80.0–100.0)
Platelets: 360 10*3/uL (ref 150–400)
RBC: 4.91 MIL/uL (ref 3.87–5.11)
RDW: 14 % (ref 11.5–15.5)
WBC: 4.9 10*3/uL (ref 4.0–10.5)
nRBC: 0 % (ref 0.0–0.2)

## 2023-09-18 LAB — HIV ANTIBODY (ROUTINE TESTING W REFLEX): HIV Screen 4th Generation wRfx: NONREACTIVE

## 2023-09-18 NOTE — Plan of Care (Signed)
 EEG IMPRESSION: This study is suggestive of cortical dysfunction arising from left fronto-temporal region likely secondary to underlying structural abnormality. No seizures or epileptiform discharges were seen throughout the recording.    No new recommendations  Please call with questions as needed    -- Eligio Lav, MD Neurologist [facility]

## 2023-09-18 NOTE — Consult Note (Signed)
 Orthopedic Surgery Consult Note  Assessment: Patient is a 58 y.o. female with right pathological humerus fracture. Also, has an intracranial mass, nodular mass in the lung, and several enlarged lymph nodes  Plan: -Recommend biopsy with IR or [facility] radiology -Will make further decisions based off the biopsy results -NWB RUE in coaptation splint -Okay for diet and dvt ppx from ortho perspective -PT evaluate and treat -Pain control -Dispo: pending biopsy results  ___________________________________________________________________________   Reason for consult: right pathological humerus fracture   History: Patient is a 58 year old female who presented to [facility] ER on 09/17/2023. History obtained from chart review since she is just answering yes/no to questions. She presented with aphasia, right sided weakness, and right arm pain. A code stroke was called and she was found to have an intracranial mass. Keppra  and decadron  were started. She also appeared to be having right arm pain and reported to the primary team that she had a fall within the last couple of days. She has had difficulty using the arm since that time. Denies pain in her other extremities. Denies paresthesias and numbness.    Of note, has a long history of tobacco use.  History:  Patient is a 59 y.o. female who was previously living independently who present to [facility] ER on   Review of systems: General: denies fevers and chills, myalgias Neurologic: denies recent changes in vision, slurred speech Abdomen: denies nausea, vomiting, hematemesis Respiratory: denies cough, shortness of breath  Past medical history:  COPD Depression/anxiety HTN  Allergies: NKDA   Past surgical history:  None   Social history: Quit about three years ago but previously used nicotine -containing products (cigarettes, vaping, smokeless, etc.) Alcohol  use: Yes, about six drinks per week Denies use of recreational drugs  Family  history: -Mother had leukemia, no other family history of cancer   Physical Exam:  BMI of 18.7  General: no acute distress, appears stated age Neurologic: alert, answering questions appropriately, following commands Cardiovascular: regular rate, no cyanosis Respiratory: unlabored breathing on room air, symmetric chest rise Psychiatric: appropriate affect, normal cadence to speech  MSK:   -Right upper extremity  TTP over the humerus, no gross deformity, no open wounds Does not fire deltoid due to pain. Fires biceps, triceps, wrist extensors, wrist flexors, finger extensors, finger flexors  AIN/PIN/IO intact  Palpable radial pulse  Sensation intact to light touch in median/ulnar/radial/axillary nerve distributions  Hand warm and well perfused  -Left upper extremity  No tenderness to palpation over extremity, no gross deformity, no open wounds Fires deltoid, biceps, triceps, wrist extensors, wrist flexors, finger extensors, finger flexors  AIN/PIN/IO intact (weak PIN function)  Palpable radial pulse  Sensation intact to light touch in median/ulnar/radial/axillary nerve distributions  Hand warm and well perfused  -Bilateral lower extremities  No tenderness to palpation over extremity, no gross deformity, no open wounds, no pain with log roll Fires hip flexors, quadriceps, hamstrings, tibialis anterior, gastrocnemius and soleus, extensor hallucis longus Plantarflexes and dorsiflexes toes Sensation intact to light touch in sural, saphenous, tibial, deep peroneal, and superficial peroneal nerve distributions Foot warm and well perfused, palpable DP pulses  Imaging: XR of the right humerus from 09/17/2023 was independently reviewed and interpreted, showing displaced, midshaft humerus fracture with valgus alignment. There is lucency at the fracture site with increased density within the oft tissue adjacent to the fracture.    Patient name: Regina [name] Patient MRN:  [id number] Date: 09/18/23

## 2023-09-18 NOTE — Evaluation (Signed)
 Occupational Therapy Evaluation Patient Details Name: Regina [name] MRN: [id number] DOB: 1965-09-30 Today's Date: 09/18/2023   History of Present Illness Pt is a 58 y/o female presenting on 1/11 with aphasia, R sided weakness, and R arm pain. Found with R humerus mid shaft pathologic fx, abnormal adenopathy in thoracic cavity. 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 PMH includes: anxiety, COPD, emphysema of lung, HTN.   Clinical Impression   PTA patient independent and working at [employer]. Admitted for above and presents with problem list below. She is able to complete transfers and mobility with supervision, ADLs with up to min guard assist.  Pts R UE in sling, adjusted for increased comfort but limited during Adls due to decreased functional use of UE.  She is able to answer yes/no questions but otherwise only verbalizes 'no; follows most 1 step commands but very inconsistent with 2 step commands.  Pt is able to engage in ADLs with supervision but would need assist for IADLs (including med mgmt, driving and meals).  Would recommend 24/7 support at dc.  Based on performance, recommend outpatient OT services. Will follow acutely.       If plan is discharge home, recommend the following: A little help with walking and/or transfers;A little help with bathing/dressing/bathroom;Assistance with cooking/housework;Assist for transportation;Direct supervision/assist for financial management;Direct supervision/assist for medications management;Supervision due to cognitive status    Functional Status Assessment  Patient has had a recent decline in their functional status and demonstrates the ability to make significant improvements in function in a reasonable and predictable amount of time.  Equipment Recommendations  BSC/3in1    Recommendations for Other Services       Precautions / Restrictions  Precautions Precautions: Other (comment) Precaution Comments: aphasia, decreased cog Required Braces or Orthoses: Sling Restrictions Weight Bearing Restrictions Per Provider Order: No Other Position/Activity Restrictions: Sling in place RUE. No WB orders in chart. Maintained NWB during eval.      Mobility Bed Mobility Overal bed mobility: Independent                  Transfers Overall transfer level: Needs assistance Equipment used: None Transfers: Sit to/from Stand Sit to Stand: Supervision                  Balance Overall balance assessment: No apparent balance deficits (not formally assessed)                                         ADL either performed or assessed with clinical judgement   ADL Overall ADL's : Needs assistance/impaired     Grooming: Supervision/safety;Standing Grooming Details (indicate cue type and reason): cueing to utilize soap washing hands         Upper Body Dressing : Minimal assistance;Sitting   Lower Body Dressing: Contact guard assist;Sit to/from stand Lower Body Dressing Details (indicate cue type and reason): able to don/doff socks Toilet Transfer: Supervision/safety;Ambulation           Functional mobility during ADLs: Supervision/safety       Vision Patient Visual Report: No change from baseline Additional Comments: pt able to scan but difficult to assess due to cognition, neice at side reports pt was reading her phone earlier today     Perception         Praxis  Pertinent Vitals/Pain Pain Assessment Pain Assessment: Faces Faces Pain Scale: Hurts little more Pain Location: RUE Pain Descriptors / Indicators: Discomfort, Sore, Grimacing Pain Intervention(s): Monitored during session, Repositioned (adjusted sling positioning and placed pillow under arm)     Extremity/Trunk Assessment Upper Extremity Assessment Upper Extremity Assessment: RUE deficits/detail;Right hand  dominant RUE Deficits / Details: pt with humerus fx and in sling RUE: Unable to fully assess due to immobilization;Unable to fully assess due to pain RUE Sensation: WNL RUE Coordination: decreased fine motor;decreased gross motor   Lower Extremity Assessment Lower Extremity Assessment: Defer to PT evaluation   Cervical / Trunk Assessment Cervical / Trunk Assessment: Normal   Communication Communication Communication: Difficulty following commands/understanding;Difficulty communicating thoughts/reduced clarity of speech Following commands: Follows one step commands consistently;Follows one step commands with increased time;Follows multi-step commands inconsistently Cueing Techniques: Verbal cues;Tactile cues;Visual cues   Cognition Arousal: Alert Behavior During Therapy: WFL for tasks assessed/performed Overall Cognitive Status: Difficult to assess                                 General Comments: Following simple commands consistently. Inconsistent multi command follows. Able to answer yes/no questions. Word finding difficulty. Decreased awareness.     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        OT Problem List: Decreased range of motion;Decreased activity  tolerance;Decreased strength;Decreased cognition;Decreased safety awareness;Decreased knowledge of use of DME or AE;Decreased knowledge of precautions;Pain;Impaired UE functional use      OT Treatment/Interventions: Self-care/ADL training;Therapeutic exercise;DME and/or AE instruction;Therapeutic activities;Cognitive remediation/compensation;Patient/family education    OT Goals(Current goals can be found in the care plan section) Acute Rehab OT Goals Patient Stated Goal: home OT Goal Formulation: With patient Time For Goal Achievement: 10/02/23 Potential to Achieve Goals: Fair  OT Frequency: Min 1X/week    Co-evaluation              AM-PAC OT 6 Clicks Daily Activity     Outcome Measure Help from another person eating meals?: Total (NPO) Help from another person taking care of personal grooming?: A Little Help from another person toileting, which includes using toliet, bedpan, or urinal?: A Little Help from another person bathing (including washing, rinsing, drying)?: A Little Help from another person to put on and taking off regular upper body clothing?: A Little Help from another person to put on and taking off regular lower body clothing?: A Little 6 Click Score: 16   End of Session Equipment Utilized During Treatment: Other (comment) (sling) Nurse Communication: Mobility status  Activity Tolerance: Patient tolerated treatment well Patient left: in bed;with call bell/phone within reach;with chair alarm set;with family/visitor present  OT Visit Diagnosis: Other abnormalities of gait and mobility (R26.89);Pain;Other  symptoms and signs involving cognitive function;Cognitive communication deficit (R41.841) Symptoms and signs involving cognitive functions: Other cerebrovascular disease Pain - Right/Left: Right Pain - part of body: Arm                Time: 8967-8947 OT Time Calculation (min): 20 min Charges:  OT General Charges $OT Visit: 1 Visit OT Evaluation $OT Eval  Moderate Complexity: 1 Mod  Etta NOVAK, OT Acute Rehabilitation Services Office [phone]   Etta GORMAN Hope 09/18/2023, 1:04 PM

## 2023-09-18 NOTE — Evaluation (Signed)
 Physical Therapy Evaluation Patient Details Name: Regina [name] MRN: [id number] DOB: Apr 11, 1966 Today's Date: 09/18/2023  History of Present Illness  Pt is a 58 y/o female presenting on 1/11 with aphasia, R sided weakness, and R arm pain. Found with R humerus mid shaft pathologic fx, abnormal adenopathy in thoracic cavity. 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 PMH includes: anxiety, COPD, emphysema of lung, HTN.   Clinical Impression  Pt admitted with above diagnosis. PTA pt independent, driving, and employed at [employer]. 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 Pt currently with functional limitations due to the deficits listed below (see PT Problem List). On eval, she demo independence with bed mobility. Supervision transfers and amb 175' without AD. No LOB. Cadence WNL. BLE strength intact. Expressive and receptive language deficits. Pt will benefit from acute skilled PT to increase their independence and safety with mobility to allow discharge. PT to follow acutely to further assess high level balance and functional mobility skills. No follow up PT services or DME indicated. Recommend 24/7 supervision due to cognitive deficits.          If plan is discharge home, recommend the following: Supervision due to cognitive status;Assist for transportation;Direct supervision/assist for medications management   Can travel by private vehicle        Equipment Recommendations None recommended by PT  Recommendations for Other Services       Functional Status Assessment Patient has had a recent decline in their functional status and demonstrates the ability to make significant improvements in function in a reasonable and predictable amount of time.     Precautions / Restrictions Precautions Precautions: Other (comment) Precaution Comments: aphasia, decreased cog Required Braces or Orthoses:  Sling Restrictions Other Position/Activity Restrictions: Sling in place RUE. No WB orders in chart. Maintained NWB during eval.      Mobility  Bed Mobility Overal bed mobility: Independent                  Transfers Overall transfer level: Needs assistance Equipment used: None Transfers: Sit to/from Stand Sit to Stand: Supervision                Ambulation/Gait Ambulation/Gait assistance: Supervision Gait Distance (Feet): 175 Feet Assistive device: None Gait Pattern/deviations: WFL(Within Functional Limits) Gait velocity: WFL Gait velocity interpretation: >4.37 ft/sec, indicative of normal walking speed   General Gait Details: steady gait without AD. Supervision for safety and IV pole management  Stairs            Wheelchair Mobility     Tilt Bed    Modified Rankin (Stroke Patients Only)       Balance Overall balance assessment: No apparent balance deficits (not formally assessed)                                           Pertinent Vitals/Pain Pain Assessment Pain Assessment: Faces Faces Pain Scale: Hurts little more Pain Location: RUE Pain Descriptors / Indicators: Discomfort, Sore, Grimacing Pain Intervention(s): Monitored during session    Home Living Family/patient expects to be discharged to:: Private residence Living Arrangements: Other (Comment) (brother) Available Help at Discharge: Family;Available PRN/intermittently Type of Home: Apartment Home Access: Level entry       Home Layout: One level Home Equipment: None Additional Comments: Pt is  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    Prior Function Prior Level of Function : Independent/Modified Independent;Working/employed;Driving             Mobility Comments: no AD ADLs Comments: works for [employer], 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     Extremity/Trunk Assessment   Upper Extremity Assessment Upper Extremity Assessment: RUE deficits/detail;Right  hand dominant RUE Deficits / Details: sling in place    Lower Extremity Assessment Lower Extremity Assessment: Overall WFL for tasks assessed    Cervical / Trunk Assessment Cervical / Trunk Assessment: Normal  Communication   Communication Communication: Difficulty communicating thoughts/reduced clarity of speech Cueing Techniques: Verbal cues;Gestural cues  Cognition Arousal: Alert Behavior During Therapy: WFL for tasks assessed/performed Overall Cognitive Status: Difficult to assess                                 General Comments: Following simple commands consistently. Inconsistent multi command follows. Able to answer yes/no questions. Word finding difficulty. Decreased awareness.        General Comments General comments (skin integrity, edema, etc.): VSS on RA    Exercises     Assessment/Plan    PT Assessment Patient needs continued PT services  PT Problem List Decreased cognition;Decreased safety awareness;Decreased mobility       PT Treatment Interventions Functional mobility training;Balance training;Patient/family education;Therapeutic activities;Gait training;Stair training;Cognitive remediation;Therapeutic exercise    PT Goals (Current goals can be found in the Care Plan section)  Acute Rehab PT Goals Patient Stated Goal: not stated PT Goal Formulation: With patient/family Time For Goal Achievement: 10/02/23 Potential to Achieve Goals: Good    Frequency Min 1X/week     Co-evaluation               AM-PAC PT 6 Clicks Mobility  Outcome Measure Help needed turning from your back to your side while in a flat bed without using bedrails?: None Help needed moving from lying on your back to sitting on the side of a flat bed without using bedrails?: None Help needed moving to and from a bed to a chair (including a wheelchair)?: A Little Help needed standing up from a chair using your arms (e.g., wheelchair or bedside chair)?: A  Little Help needed to walk in hospital room?: A Little Help needed climbing 3-5 steps with a railing? : A Little 6 Click Score: 20    End of Session Equipment Utilized During Treatment: Gait belt;Other (comment) (RUE sling) Activity Tolerance: Patient tolerated treatment well Patient left: in bed;with call bell/phone within reach;with family/visitor present Nurse Communication: Mobility status PT Visit Diagnosis: Difficulty in walking, not elsewhere classified (R26.2)    Time: 8967-8947 PT Time Calculation (min) (ACUTE ONLY): 20 min   Charges:   PT Evaluation $PT Eval Moderate Complexity: 1 Mod   PT General Charges $$ ACUTE PT VISIT: 1 Visit         Sari MATSU., PT  Office # [phone]   Erven Sari Shaker 09/18/2023, 11:40 AM

## 2023-09-18 NOTE — Procedures (Signed)
 Patient Name: Regina [name]  MRN: [id number]  Epilepsy Attending: Arlin MALVA Krebs  Referring Physician/Provider: Voncile Isles, MD  Date: 09/17/2023 Duration: 22.31 mins  Patient history: 58 y.o. female with medical history significant for COPD, depression, anxiety, and hypertension, presenting to the emergency department with aphasia, right sided weakness, and right arm pain. EEG to evaluate for seizure  Level of alertness: Awake  AEDs during EEG study: LEV  Technical aspects: This EEG study was done with scalp electrodes positioned according to the 10-20 International system of electrode placement. Electrical activity was reviewed with band pass filter of 1-70Hz , sensitivity of 7 uV/mm, display speed of 60mm/sec with a 60Hz  notched filter applied as appropriate. EEG data were recorded continuously and digitally stored.  Video monitoring was available and reviewed as appropriate.  Description: The posterior dominant rhythm consists of 8 Hz activity of moderate voltage (25-35 uV) seen predominantly in posterior head regions, symmetric and reactive to eye opening and eye closing. EEG showed continuous polymorphic 3 to 6 Hz theta-delta slowing in left fronto-temporal region. Hyperventilation and photic stimulation were not performed.     ABNORMALITY - Continuous slow, left fronto-temporal   IMPRESSION: This study is suggestive of cortical dysfunction arising from left fronto-temporal region likely secondary to underlying structural abnormality. No seizures or epileptiform discharges were seen throughout the recording.  Marce Charlesworth O Askari Kinley

## 2023-09-18 NOTE — Progress Notes (Signed)
 PROGRESS NOTE    Regina [name]  FMW:[id number] DOB: 1966/07/30 DOA: 09/17/2023 PCP: Celestia Rosaline SQUIBB, NP   Brief Narrative:  Regina [name] is a 58 y.o. female with medical history significant for COPD, depression, anxiety, and hypertension, presenting to the emergency department with aphasia, right sided weakness, and right arm pain.   At intake imaging was remarkable for right sided humeral fracture, likely pathologic as well as notable right upper lobe/apical opacification concerning for mass as well as large frontal lobe mass with midline shift.  Hospitalist called for admission, neurosurgery and orthopedic surgery called in consult.  Assessment & Plan:   Principal Problem:   Brain mass Active Problems:   HTN (hypertension)   Depression   COPD (chronic obstructive pulmonary disease) (HCC)   Anxiety   Brain mass, unspecified With vasogenic edema and brain compression, 9 mm left-to-right shift -MRI confirms 2.8 x 2.6 x 2.0 mass of the subcortical left frontal lobe with extensive edema and brain compression -Neurosurgery following, recommend biopsy of lung apical mass versus right arm mass at the site of humeral fracture. -Discussed case with general surgery and orthopedic surgery, recommend IR evaluation for image guided biopsy the next 24 hours as they are available -Concern for small cell given lung lesion with high risk of mets to bone and brain -Neurology following, continue Decadron , Keppra    Pathologic fracture of the right mid humeral shaft  -Orthopedic surgery called, would likely need plating which can be done from a palliative standpoint given her mobility needs -Surgery would delay chemotherapy and radiation by 2 weeks to ensure wound healing, further discussion between oncology and orthopedic surgery as appropriate  Diffuse adenopathy  -Noted on multiple images as below -Further workup for biopsy of arm versus apical lung tissue for diagnosis pending  COPD  -  Not in exacerbation    - Breathing treatments as-needed    Hypertension  - Treating as-needed only for now    Questionable atrial fibrillation, resolved - There was an EKG in ED with atrial fib vs sinus tachycardia with PACs  - She is in sinus rhythm on monitor  - Continue cardiac monitoring   DVT prophylaxis: SCDs Start: 09/17/23 1937 Code Status:   Code Status: Full Code  Family Communication: Daughter at bedside  Status is: Inpatient  Dispo: The patient is from: Home              Anticipated d/c is to: To be determined              Anticipated d/c date is: To be determined              Patient currently not medically stable for discharge  Consultants:  Orthopedic surgery, neurosurgery, neurology  Procedures:  None  Antimicrobials:  None indicated  Subjective: No acute issues or events overnight, requesting diet but review of systems otherwise unremarkable other than delayed speech due to word finding difficulties  Objective: Vitals:   09/18/23 0045 09/18/23 0058 09/18/23 0100 09/18/23 0300  BP: (!) 156/96  (!) 151/94 (!) 141/87  Pulse: 77  86 70  Resp: 20  20 20   Temp:  98 F (36.7 C) 98 F (36.7 C) 98.2 F (36.8 C)  TempSrc:  Oral Oral Oral  SpO2: 96%  96% 96%  Weight:      Height:        Intake/Output Summary (Last 24 hours) at 09/18/2023 0733 Last data filed at 09/18/2023 0416 Gross per 24 hour  Intake 1871.11 ml  Output --  Net 1871.11 ml   Filed Weights   09/17/23 1718  Weight: 52.5 kg    Examination:  General:  Pleasantly resting in bed, No acute distress. HEENT:  Normocephalic atraumatic.  Sclerae nonicteric, noninjected.  Extraocular movements intact bilaterally. Neck:  Without mass or deformity.  Trachea is midline. Lungs:  Clear to auscultate bilaterally without rhonchi, wheeze, or rales. Heart:  Regular rate and rhythm.  Without murmurs, rubs, or gallops. Abdomen:  Soft, nontender, nondistended.  Without guarding or  rebound. Extremities: Right upper extremity internally rotated in sling.  Data Reviewed: I have personally reviewed following labs and imaging studies  CBC: Recent Labs  Lab 09/17/23 1715 09/17/23 1810 09/18/23 0620  WBC 9.2  --  4.9  HGB 14.9 16.3* 14.6  HCT 43.9 48.0* 43.0  MCV 87.8  --  87.6  PLT 372  --  360   Basic Metabolic Panel: Recent Labs  Lab 09/17/23 1715 09/17/23 1810 09/18/23 0620  NA 134* 135 135  K 3.6 3.1* 4.3  CL 102 98 103  CO2 18*  --  19*  GLUCOSE 80 80 116*  BUN 10 10 8   CREATININE 0.73 0.70 0.71  CALCIUM  10.3  --  9.2  MG 1.7  --   --    GFR: Estimated Creatinine Clearance: 62.8 mL/min (by C-G formula based on SCr of 0.71 mg/dL). Liver Function Tests: Recent Labs  Lab 09/18/23 0620  AST 23  ALT 13  ALKPHOS 98  BILITOT 0.8  PROT 7.2  ALBUMIN 3.6   No results for input(s): LIPASE, AMYLASE in the last 168 hours. No results for input(s): AMMONIA in the last 168 hours. Coagulation Profile: No results for input(s): INR, PROTIME in the last 168 hours. Cardiac Enzymes: No results for input(s): CKTOTAL, CKMB, CKMBINDEX, TROPONINI in the last 168 hours. BNP (last 3 results) No results for input(s): PROBNP in the last 8760 hours. HbA1C: No results for input(s): HGBA1C in the last 72 hours. CBG: Recent Labs  Lab 09/17/23 1802  GLUCAP 87   Lipid Profile: No results for input(s): CHOL, HDL, LDLCALC, TRIG, CHOLHDL, LDLDIRECT in the last 72 hours. Thyroid  Function Tests: No results for input(s): TSH, T4TOTAL, FREET4, T3FREE, THYROIDAB in the last 72 hours. Anemia Panel: No results for input(s): VITAMINB12, FOLATE, FERRITIN, TIBC, IRON, RETICCTPCT in the last 72 hours. Sepsis Labs: No results for input(s): PROCALCITON, LATICACIDVEN in the last 168 hours.  No results found for this or any previous visit (from the past 240 hours).       Radiology Studies: MR BRAIN W WO  CONTRAST Result Date: 09/18/2023 CLINICAL DATA:  Initial evaluation for intracranial mass. EXAM: MRI HEAD WITHOUT AND WITH CONTRAST TECHNIQUE: Multiplanar, multiecho pulse sequences of the brain and surrounding structures were obtained without and with intravenous contrast. CONTRAST:  5mL GADAVIST  GADOBUTROL  1 MMOL/ML IV SOLN COMPARISON:  Prior CTs from earlier the same day. FINDINGS: Brain: Cerebral volume within normal limits. Patchy T2/FLAIR hyperintensity involving the periventricular deep white matter both cerebral hemispheres, consistent with chronic small vessel ischemic disease, moderately advanced in nature. No evidence for acute or subacute ischemia. No areas of chronic cortical infarction. No acute or chronic intracranial blood products. Round mass measuring 2.8 x 2.6 x 2.0 cm seen centered at the subcortical left frontal lobe (series 17, image 36). Lesion demonstrates avid peripheral postcontrast enhancement. Extensive T2/FLAIR signal intensity throughout the adjacent left frontal lobe, consistent with vasogenic edema and/or possible infiltrating nonenhancing tumor. Partial effacement of the left  lateral ventricle with 9 mm of left-to-right shift. No hydrocephalus or trapping. Finding is nonspecific, and could reflect a primary CNS neoplasm versus solitary intracranial metastasis. No other mass lesion or abnormal enhancement. Pituitary gland suprasellar region within normal limits. Vascular: Major intracranial vascular flow voids are maintained. Skull and upper cervical spine: Craniocervical ungual limits. Bone marrow signal intensity normal. No scalp soft tissue abnormality. Sinuses/Orbits: Globes orbital soft tissues within normal limits. Paranasal sinuses are largely clear. No significant mastoid effusion. Other: None. IMPRESSION: 1. 2.8 x 2.6 x 2.0 cm enhancing mass centered at the subcortical left frontal lobe, with extensive surrounding vasogenic edema and 9 mm of left-to-right shift. Primary  differential considerations include a solitary intracranial metastasis versus primary CNS neoplasm. 2. Underlying moderately advanced chronic microvascular ischemic disease. Electronically Signed   By: Morene Hoard M.D.   On: 09/18/2023 00:51   DG Humerus Right Result Date: 09/17/2023 CLINICAL DATA:  Fall, right arm deformity EXAM: RIGHT HUMERUS - 2+ VIEW COMPARISON:  None Available. 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 Mild degenerative changes of the right shoulder. 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 Electronically Signed   By: Pinkie Pebbles M.D.   On: 09/17/2023 21:40   CT CHEST ABDOMEN PELVIS W CONTRAST Result Date: 09/17/2023 CLINICAL DATA:  Left frontal lobe mass, possible metastatic lesion, assessment for other metastatic disease or primary. * Tracking Code: BO * EXAM: CT CHEST, ABDOMEN, AND PELVIS WITH CONTRAST TECHNIQUE: Multidetector CT imaging of the chest, abdomen and pelvis was performed following the standard protocol during bolus administration of intravenous contrast. RADIATION DOSE REDUCTION: This exam was performed according to the departmental dose-optimization program which includes automated exposure control, adjustment of the mA and/or kV according to patient size and/or use of iterative reconstruction technique. CONTRAST:  60mL OMNIPAQUE  IOHEXOL  350 MG/ML SOLN COMPARISON:  05/25/2023 FINDINGS: CT CHEST FINDINGS Cardiovascular: Mild atheromatous vascular calcification of the thoracic aorta. Mediastinum/Nodes: Pathologic right supraclavicular, right paratracheal, bilateral hilar, subcarinal, and bilateral infrahilar adenopathy. Index right paratracheal node 1.4 cm in short axis on image 17 series 3. Index left hilar node 1.3 cm in short axis on image 26 series 3. Lungs/Pleura: Substantial biapical pleuroparenchymal scarring. This has an asymmetric  nodular component in the right upper lobe measuring 2.9 by 1.5 by 1.8 cm (volume = 4.1 cm^3) which could be from scarring or tumor. Extensive emphysema.  Honeycombing at the lung bases. Musculoskeletal: Unremarkable CT ABDOMEN PELVIS FINDINGS Hepatobiliary: Unremarkable Pancreas: Unremarkable Spleen: Unremarkable Adrenals/Urinary Tract: There is dense contrast in the renal collecting systems, ureters, and urinary bladder due to recent injection for the CT angiogram. This lowers sensitivity for nonobstructive calculi. No renal or adrenal mass. No obvious abnormal filling defect along the urothelium. Stomach/Bowel: Mild prominence of stool in the rectal vault. Normal appendix. Otherwise unremarkable. Vascular/Lymphatic: Atherosclerosis is present, including aortoiliac atherosclerotic disease. Reproductive: Posterior positioning of the uterus with suspected uterine fibroids and associated calcifications. Otherwise unremarkable. Other: No supplemental non-categorized findings. Musculoskeletal: Unremarkable IMPRESSION: 1. Pathologic right supraclavicular, right paratracheal, bilateral hilar, subcarinal, and bilateral infrahilar adenopathy. 2. Substantial biapical pleuroparenchymal scarring. This has an asymmetric nodular component in the right upper lobe measuring 2.9 by 1.5 by 1.8 cm (volume = 4.1 cm^ 3) which could be from scarring or tumor. PET-CT could help differentiate. 3. Extensive emphysema. 4. Honeycombing at the lung bases, compatible with interstitial lung disease. 5. Mild prominence of stool in the rectal vault.  6. Posterior positioning of the uterus with suspected uterine fibroids and associated calcifications. 7. Aortic atherosclerosis. Aortic Atherosclerosis (ICD10-I70.0) and Emphysema (ICD10-J43.9). Electronically Signed   By: Ryan Salvage M.D.   On: 09/17/2023 19:23   CT ANGIO HEAD NECK W WO CM (CODE STROKE) Addendum Date: 09/17/2023 ADDENDUM REPORT: 09/17/2023 19:02 ADDENDUM: Delayed images  demonstrate peripheral enhancement the mass measuring 2.6 x 2.3 x 2.1 cm. Other foci of enhancement are present. This most likely represents a solitary metastasis. Electronically Signed   By: Lonni Necessary M.D.   On: 09/17/2023 19:02   Result Date: 09/17/2023 CLINICAL DATA:  Code stroke.  Left frontal lobe mass. EXAM: CT ANGIOGRAPHY HEAD AND NECK WITH AND WITHOUT CONTRAST TECHNIQUE: Multidetector CT imaging of the head and neck was performed using the standard protocol during bolus administration of intravenous contrast. Multiplanar CT image reconstructions and MIPs were obtained to evaluate the vascular anatomy. Carotid stenosis measurements (when applicable) are obtained utilizing NASCET criteria, using the distal internal carotid diameter as the denominator. RADIATION DOSE REDUCTION: This exam was performed according to the departmental dose-optimization program which includes automated exposure control, adjustment of the mA and/or kV according to patient size and/or use of iterative reconstruction technique. CONTRAST:  75mL OMNIPAQUE  IOHEXOL  350 MG/ML SOLN COMPARISON:  CT head without contrast 09/17/2023 FINDINGS: CTA NECK FINDINGS Aortic arch: A 3 vessel arch configuration is present. The aortic arch is incompletely imaged on this study. No significant focal stenosis is present the great vessel origins. Right carotid system: Atherosclerotic calcifications are present at the right carotid bifurcation predominantly involving the external carotid artery. The right common carotid artery is normal. The cervical right ICA is otherwise normal. Left carotid system: The left common carotid artery is within normal limits. Mild noncalcified plaque is present posteriorly at the bifurcation without significant stenosis. The cervical left ICA is otherwise normal. Vertebral arteries: The left vertebral artery is dominant vessel. Both vertebral arteries originate from the subclavian arteries without significant  stenosis. No significant stenosis is present in either vertebral artery in the neck. Skeleton: Vertebral body heights and alignment are normal. No focal osseous lesions are present. The patient is edentulous. Other neck: Soft tissues the neck are otherwise unremarkable. Salivary glands are within normal limits. Thyroid  is normal. No significant adenopathy is present. No focal mucosal or submucosal lesions are present. Upper chest: Central and paraseptal emphysematous changes are present. Peripheral lung nodules are present in the right upper lobe. Please see dedicated CT of the chest for further description. No pneumothorax is present. Paratracheal adenopathy is present. Review of the MIP images confirms the above findings CTA HEAD FINDINGS Anterior circulation: Internal carotid arteries are within normal limits the skull base to the ICA termini. The A1 and M1 segments are normal. Anterior communicating artery is patent. Mass effect deviates the left MCA and bilateral ACA branch vessels. No significant proximal stenosis or occlusion is present. Posterior circulation: The PICA origins are visualized and normal. Scratched at the left PICA origin is visualized and normal. The right PICA scratched at the hypoplastic right vertebral artery terminates at the PICA. The basilar artery is normal. The superior cerebellar arteries are patent. Posterior cerebral arteries originate from basilar tip. A right posterior communicating artery contributes. The PCA branch vessels are normal bilaterally. No aneurysm is present. Venous sinuses: The dural sinuses are patent. The straight sinus and deep cerebral veins are intact. Cortical veins are within normal limits. No significant vascular malformation is evident. Anatomic variants: None Review of the MIP  images confirms the above findings IMPRESSION: 1. No emergent large vessel occlusion. 2. Mass effect deviates the left MCA and bilateral ACA branch vessels without significant  proximal stenosis or occlusion. 3. Mild atherosclerotic changes at the carotid bifurcations bilaterally without significant stenosis. 4. Peripheral lung nodules in the right upper lobe. Please see dedicated CT of the chest for further description. 5. Paratracheal adenopathy is related to metastatic disease. Please see dedicated CT of the chest for further description. 6.  Emphysema (ICD10-J43.9). Electronically Signed: By: Lonni Necessary M.D. On: 09/17/2023 18:53   CT HEAD CODE STROKE WO CONTRAST Result Date: 09/17/2023 CLINICAL DATA:  Code stroke. Neuro deficit, acute, stroke suspected. EXAM: CT HEAD WITHOUT CONTRAST TECHNIQUE: Contiguous axial images were obtained from the base of the skull through the vertex without intravenous contrast. RADIATION DOSE REDUCTION: This exam was performed according to the departmental dose-optimization program which includes automated exposure control, adjustment of the mA and/or kV according to patient size and/or use of iterative reconstruction technique. COMPARISON:  None Available. FINDINGS: Brain: A left frontal lobe mass measures 20 x 20 x 18 mm. Extensive surrounding vasogenic edema present throughout the left frontal lobe. Mass effect is present with effacement of the sulci and 8 mm of left to right midline shift. Asymmetric effacement of the left lateral ventricle is noted. No other discrete lesions are present. No acute cortical infarcts are present. The brainstem and cerebellum are within normal limits. Midline structures are within normal limits. Vascular: No hyperdense vessel or unexpected calcification. Skull: Calvarium is intact. No focal lytic or blastic lesions are present. No significant extracranial soft tissue lesion is present. Sinuses/Orbits: The paranasal sinuses and mastoid air cells are clear. The globes and orbits are within normal limits. IMPRESSION: 1. 20 x 20 x 18 mm left frontal lobe mass with extensive surrounding vasogenic edema. This is  concerning for a primary or metastatic neoplasm. Recommend MRI the brain without and with contrast for further evaluation. 2. Mass effect with effacement of the sulci and 8 mm of left to right midline shift. 3. No other discrete lesions are present. 4. No other acute intracranial abnormality. The above was relayed via text pager to Dr. ELIGIO LAV on 09/17/2023 at 18:25 . Electronically Signed   By: Lonni Necessary M.D.   On: 09/17/2023 18:25   DG Chest Portable 1 View Result Date: 09/17/2023 CLINICAL DATA:  Tachycardia. EXAM: PORTABLE CHEST 1 VIEW COMPARISON:  Radiograph 05/25/2023 FINDINGS: The heart is normal in size. Stable mediastinal contours. Chronic hyperinflation and emphysema. There is biapical pleuroparenchymal scarring, similar. Reticular opacities at both lung bases are without significant interval change, typical of honeycombing. No definite acute airspace disease. No features of pulmonary edema. No pneumothorax or significant pleural effusion. IMPRESSION: 1. Chronic hyperinflation and emphysema. 2. Bibasilar reticular opacities, typical of honeycombing, without significant interval change. 3. Biapical pleuroparenchymal scarring. Electronically Signed   By: Andrea Gasman M.D.   On: 09/17/2023 18:02   Scheduled Meds:  dexamethasone  (DECADRON ) injection  4 mg Intravenous Q6H   sodium chloride  flush  3 mL Intravenous Q12H   Continuous Infusions:  sodium chloride  65 mL/hr at 09/18/23 0416   levETIRAcetam  Stopped (09/17/23 2023)     LOS: 0 days   Time spent:  Elsie JAYSON [place], DO [facility]  If 7PM-7AM, please contact night-coverage [email or web address]  09/18/2023, 7:33 AM

## 2023-09-18 NOTE — ED Notes (Signed)
 Patient ambulated to bathroom with assistance of family member.

## 2023-09-19 ENCOUNTER — Inpatient Hospital Stay (HOSPITAL_COMMUNITY): Payer: 59

## 2023-09-19 DIAGNOSIS — G9389 Other specified disorders of brain: Secondary | ICD-10-CM | POA: Diagnosis not present

## 2023-09-19 DIAGNOSIS — M84521A Pathological fracture in neoplastic disease, right humerus, initial encounter for fracture: Secondary | ICD-10-CM

## 2023-09-19 LAB — BASIC METABOLIC PANEL
Anion gap: 12 (ref 5–15)
BUN: 12 mg/dL (ref 6–20)
CO2: 18 mmol/L — ABNORMAL LOW (ref 22–32)
Calcium: 9 mg/dL (ref 8.9–10.3)
Chloride: 101 mmol/L (ref 98–111)
Creatinine, Ser: 0.55 mg/dL (ref 0.44–1.00)
GFR, Estimated: >60 mL/min (ref >60)
Glucose, Bld: 149 mg/dL — ABNORMAL HIGH (ref 70–99)
Potassium: 3.8 mmol/L (ref 3.5–5.1)
Sodium: 131 mmol/L — ABNORMAL LOW (ref 135–145)

## 2023-09-19 LAB — CBC
HCT: 40 % (ref 36.0–46.0)
Hemoglobin: 13.9 g/dL (ref 12.0–15.0)
MCH: 30 pg (ref 26.0–34.0)
MCHC: 34.8 g/dL (ref 30.0–36.0)
MCV: 86.4 fL (ref 80.0–100.0)
Platelets: 329 10*3/uL (ref 150–400)
RBC: 4.63 MIL/uL (ref 3.87–5.11)
RDW: 14.2 % (ref 11.5–15.5)
WBC: 12.9 10*3/uL — ABNORMAL HIGH (ref 4.0–10.5)
nRBC: 0 % (ref 0.0–0.2)

## 2023-09-19 LAB — PROTIME-INR
INR: 1 (ref 0.8–1.2)
Prothrombin Time: 13.1 s (ref 11.4–15.2)

## 2023-09-19 MED ORDER — MIDAZOLAM HCL 2 MG/2ML IJ SOLN
INTRAMUSCULAR | Status: AC
  Filled 2023-09-19: qty 2

## 2023-09-19 MED ORDER — FENTANYL CITRATE (PF) 100 MCG/2ML IJ SOLN
INTRAMUSCULAR | Status: AC | PRN
  Administered 2023-09-19: 50 ug via INTRAVENOUS

## 2023-09-19 MED ORDER — LIDOCAINE HCL (PF) 1 % IJ SOLN
9.0000 mL | Freq: Once | INTRAMUSCULAR | Status: AC
  Administered 2023-09-19: 9 mL via INTRADERMAL

## 2023-09-19 MED ORDER — MIDAZOLAM HCL 2 MG/2ML IJ SOLN
INTRAMUSCULAR | Status: AC | PRN
  Administered 2023-09-19 (×2): 1 mg via INTRAVENOUS

## 2023-09-19 MED ORDER — FENTANYL CITRATE (PF) 100 MCG/2ML IJ SOLN
INTRAMUSCULAR | Status: AC
  Filled 2023-09-19: qty 2

## 2023-09-19 NOTE — Progress Notes (Signed)
 Occupational Therapy Treatment Patient Details Name: Fontella Shan MRN: [id number] DOB: June 16, 1966 Today's Date: 09/19/2023   History of present illness Pt is a 58 y/o female presenting on 1/11 with aphasia, R sided weakness, and R arm pain. Found with R humerus mid shaft pathologic fx, abnormal adenopathy in thoracic cavity. 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 PMH includes: anxiety, COPD, emphysema of lung, HTN.   OT comments  Patient received in bed with RUE sling partly on. Patient able to get to EOB and sling readjusted. Patient participated in UB and LB dressing seated on EOB with min assist for UB and CGA for LB dressing. Patient performed mobility in hallway to address path finding and cognition with occasional cues to navigate hallway. Patient asked to return to bed due to limited sleep last night. Patient to continue to be followed by acute OT to address cognition and self care.        If plan is discharge home, recommend the following:  A little help with walking and/or transfers;A little help with bathing/dressing/bathroom;Assistance with cooking/housework;Assist for transportation;Direct supervision/assist for financial management;Direct supervision/assist for medications management;Supervision due to cognitive status   Equipment Recommendations  BSC/3in1    Recommendations for Other Services      Precautions / Restrictions Precautions Precautions: Other (comment) Precaution Comments: aphasia, decreased cog Required Braces or Orthoses: Sling Restrictions Weight Bearing Restrictions Per Provider Order: No Other Position/Activity Restrictions: Sling in place RUE. No WB orders in chart. Maintained NWB during eval.       Mobility Bed Mobility Overal bed mobility: Independent             General bed mobility comments: no assistance to get to EOB and back to supine     Transfers Overall transfer level: Needs assistance Equipment used: None Transfers: Sit to/from Stand Sit to Stand: Supervision                 Balance Overall balance assessment: No apparent balance deficits (not formally assessed)                                         ADL either performed or assessed with clinical judgement   ADL Overall ADL's : Needs assistance/impaired     Grooming: Supervision/safety;Standing       Lower Body Bathing: Contact guard assist   Upper Body Dressing : Minimal assistance;Sitting Upper Body Dressing Details (indicate cue type and reason): to donn gown for back Lower Body Dressing: Contact guard assist Lower Body Dressing Details (indicate cue type and reason): able to don/doff socks                    Extremity/Trunk Assessment Upper Extremity Assessment Upper Extremity Assessment: RUE deficits/detail;Right hand dominant RUE Deficits / Details: pt with humerus fx and in sling RUE Sensation: WNL RUE Coordination: decreased fine motor;decreased gross motor            Vision       Perception     Praxis      Cognition Arousal: Alert Behavior During Therapy: WFL for tasks assessed/performed Overall Cognitive Status: Difficult to assess  General Comments: following simple commands with difficulty with work finding.        Exercises      Shoulder Instructions       General Comments VSS on RA    Pertinent Vitals/ Pain       Pain Assessment Pain Assessment: Faces Faces Pain Scale: Hurts a little bit Pain Location: RUE Pain Descriptors / Indicators: Discomfort, Sore, Grimacing Pain Intervention(s): Monitored during session, Repositioned (adjusted sling)  Home Living                                          Prior Functioning/Environment              Frequency  Min 1X/week        Progress Toward Goals  OT  Goals(current goals can now be found in the care plan section)  Progress towards OT goals: Progressing toward goals  Acute Rehab OT Goals Patient Stated Goal: go home OT Goal Formulation: With patient Time For Goal Achievement: 10/02/23 Potential to Achieve Goals: Fair ADL Goals Pt Will Perform Grooming: with modified independence;standing Pt Will Perform Upper Body Dressing: with modified independence;sitting Pt Will Perform Lower Body Dressing: with modified independence;sit to/from stand Pt Will Transfer to Toilet: with modified independence;ambulating Pt Will Perform Toileting - Clothing Manipulation and hygiene: with modified independence;sit to/from stand Additional ADL Goal #1: Pt will complete pill box test and or 3 step trail making task with no more than supervision.  Plan      Co-evaluation                 AM-PAC OT 6 Clicks Daily Activity     Outcome Measure   Help from another person eating meals?: Total (NPO) Help from another person taking care of personal grooming?: A Little Help from another person toileting, which includes using toliet, bedpan, or urinal?: A Little Help from another person bathing (including washing, rinsing, drying)?: A Little Help from another person to put on and taking off regular upper body clothing?: A Little Help from another person to put on and taking off regular lower body clothing?: A Little 6 Click Score: 16    End of Session Equipment Utilized During Treatment: Other (comment);Gait belt (sling)  OT Visit Diagnosis: Other abnormalities of gait and mobility (R26.89);Pain;Other symptoms and signs involving cognitive function;Cognitive communication deficit (R41.841) Symptoms and signs involving cognitive functions: Other cerebrovascular disease Pain - Right/Left: Right Pain - part of body: Arm   Activity Tolerance Patient tolerated treatment well   Patient Left in bed;with call bell/phone within reach;with bed alarm set    Nurse Communication Mobility status        Time: 9064-9043 OT Time Calculation (min): 21 min  Charges: OT General Charges $OT Visit: 1 Visit OT Treatments $Self Care/Home Management : 8-22 mins  Dick Laine, OTA Acute Rehabilitation Services  Office [phone]   Jeb LITTIE Laine 09/19/2023, 12:17 PM

## 2023-09-19 NOTE — Progress Notes (Signed)
 Pt transported off unit via bed to IR for a procedure. Dionne Bucy RN

## 2023-09-19 NOTE — Plan of Care (Signed)

## 2023-09-19 NOTE — Progress Notes (Signed)
 PROGRESS NOTE    Regina [name]  FMW:[id number] DOB: 03-31-1966 DOA: 09/17/2023 PCP: Celestia Rosaline SQUIBB, NP   Brief Narrative:  Regina [name] is a 58 y.o. female with medical history significant for COPD, depression, anxiety, and hypertension, presenting to the emergency department with aphasia, right sided weakness, and right arm pain.   At intake imaging was remarkable for right sided humeral fracture, likely pathologic as well as notable right upper lobe/apical opacification concerning for mass as well as large frontal lobe mass with midline shift.  Hospitalist called for admission, neurosurgery and orthopedic surgery called in consult.  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 [facility])   Pathological fracture in neoplastic disease, right humerus, initial encounter for fracture   Brain mass, unspecified With vasogenic edema and brain compression, 9 mm left-to-right shift -MRI confirms 2.8 x 2.6 x 2.0 mass of the subcortical left frontal lobe with extensive edema and brain compression -Neurosurgery/IR/Neurology/Ortho following -Biopsy pending from RUE near humerus fracture -Concern for small cell given lung lesion with high risk of mets to bone and brain -Continue Decadron , Keppra    Pathologic fracture of the right mid humeral shaft  -Orthopedic surgery called, would likely need plating which can be done from a palliative standpoint given her mobility needs -Surgery would delay chemotherapy and radiation by 2 weeks to ensure wound healing, further discussion between oncology and orthopedic surgery as appropriate  Diffuse adenopathy  -Noted on multiple images as below -Further workup for biopsy of arm versus apical lung tissue for diagnosis pending  COPD  - Not in exacerbation     - Breathing treatments as-needed    Hypertension  - Treating as-needed only for now    Questionable atrial fibrillation, resolved - There was an EKG in ED with atrial fib vs sinus tachycardia with PACs  - She is in sinus rhythm on monitor  - Continue cardiac monitoring   DVT prophylaxis: SCDs Start: 09/17/23 1937 Code Status:   Code Status: Full Code  Family Communication: Daughter at bedside  Status is: Inpatient  Dispo: The patient is from: Home              Anticipated d/c is to: To be determined              Anticipated d/c date is: To be determined              Patient currently not medically stable for discharge  Consultants:  Orthopedic surgery, neurosurgery, neurology  Procedures:  None  Antimicrobials:  None indicated  Subjective: No acute issues or events overnight, requesting diet but review of systems otherwise unremarkable other than delayed speech due to word finding difficulties  Objective: Vitals:   09/18/23 1925 09/18/23 2000 09/18/23 2303 09/19/23 0302  BP: (!) 144/86 (!) 164/88 (!) 154/107 (!) 162/85  Pulse: 71 68    Resp: 20  20 20   Temp: 98 F (36.7 C)  97.8 F (36.6 C) (!) 97.4 F (36.3 C)  TempSrc: Oral  Oral Axillary  SpO2: 97% 97%    Weight:      Height:        Intake/Output Summary (Last 24 hours) at 09/19/2023 0703 Last data filed at 09/18/2023 2004 Gross per 24 hour  Intake 1370.01 ml  Output --  Net 1370.01 ml  Filed Weights   09/17/23 1718  Weight: 52.5 kg    Examination:  General:  Pleasantly resting in bed, No acute distress. HEENT:  Normocephalic atraumatic.  Sclerae nonicteric, noninjected.  Extraocular movements intact bilaterally. Neck:  Without mass or deformity.  Trachea is midline. Lungs:  Clear to auscultate bilaterally without rhonchi, wheeze, or rales. Heart:  Regular rate and rhythm.  Without murmurs, rubs, or gallops. Abdomen:  Soft, nontender, nondistended.  Without guarding or rebound. Extremities:  Right upper extremity internally rotated in sling.  Data Reviewed: I have personally reviewed following labs and imaging studies  CBC: Recent Labs  Lab 09/17/23 1715 09/17/23 1810 09/18/23 0620 09/19/23 0405  WBC 9.2  --  4.9 12.9*  HGB 14.9 16.3* 14.6 13.9  HCT 43.9 48.0* 43.0 40.0  MCV 87.8  --  87.6 86.4  PLT 372  --  360 329   Basic Metabolic Panel: Recent Labs  Lab 09/17/23 1715 09/17/23 1810 09/18/23 0620 09/19/23 0405  NA 134* 135 135 131*  K 3.6 3.1* 4.3 3.8  CL 102 98 103 101  CO2 18*  --  19* 18*  GLUCOSE 80 80 116* 149*  BUN 10 10 8 12   CREATININE 0.73 0.70 0.71 0.55  CALCIUM  10.3  --  9.2 9.0  MG 1.7  --   --   --    GFR: Estimated Creatinine Clearance: 62.8 mL/min (by C-G formula based on SCr of 0.55 mg/dL). Liver Function Tests: Recent Labs  Lab 09/18/23 0620  AST 23  ALT 13  ALKPHOS 98  BILITOT 0.8  PROT 7.2  ALBUMIN 3.6   No results for input(s): LIPASE, AMYLASE in the last 168 hours. No results for input(s): AMMONIA in the last 168 hours. Coagulation Profile: No results for input(s): INR, PROTIME in the last 168 hours. Cardiac Enzymes: No results for input(s): CKTOTAL, CKMB, CKMBINDEX, TROPONINI in the last 168 hours. BNP (last 3 results) No results for input(s): PROBNP in the last 8760 hours. HbA1C: No results for input(s): HGBA1C in the last 72 hours. CBG: Recent Labs  Lab 09/17/23 1802  GLUCAP 87   Lipid Profile: No results for input(s): CHOL, HDL, LDLCALC, TRIG, CHOLHDL, LDLDIRECT in the last 72 hours. Thyroid  Function Tests: No results for input(s): TSH, T4TOTAL, FREET4, T3FREE, THYROIDAB in the last 72 hours. Anemia Panel: No results for input(s): VITAMINB12, FOLATE, FERRITIN, TIBC, IRON, RETICCTPCT in the last 72 hours. Sepsis Labs: No results for input(s): PROCALCITON, LATICACIDVEN in the last 168 hours.  No results found for this or any previous visit (from the  past 240 hours).       Radiology Studies: EEG adult Result Date: 09/18/2023 Shelton Arlin KIDD, MD     09/18/2023  8:44 AM Patient Name: Regina [name] MRN: [id number] Epilepsy Attending: Arlin KIDD Shelton Referring Physician/Provider: Voncile Isles, MD Date: 09/17/2023 Duration: 22.31 mins Patient history: 58 y.o. female with medical history significant for COPD, depression, anxiety, and hypertension, presenting to the emergency department with aphasia, right sided weakness, and right arm pain. EEG to evaluate for seizure Level of alertness: Awake AEDs during EEG study: LEV Technical aspects: This EEG study was done with scalp electrodes positioned according to the 10-20 International system of electrode placement. Electrical activity was reviewed with band pass filter of 1-70Hz , sensitivity of 7 uV/mm, display speed of 5mm/sec with a 60Hz  notched filter applied as appropriate. EEG data were recorded continuously and digitally stored.  Video monitoring was available and reviewed as appropriate. Description: The posterior dominant  rhythm consists of 8 Hz activity of moderate voltage (25-35 uV) seen predominantly in posterior head regions, symmetric and reactive to eye opening and eye closing. EEG showed continuous polymorphic 3 to 6 Hz theta-delta slowing in left fronto-temporal region. Hyperventilation and photic stimulation were not performed.   ABNORMALITY - Continuous slow, left fronto-temporal IMPRESSION: This study is suggestive of cortical dysfunction arising from left fronto-temporal region likely secondary to underlying structural abnormality. No seizures or epileptiform discharges were seen throughout the recording. Arlin MALVA Krebs   MR BRAIN W WO CONTRAST Result Date: 09/18/2023 CLINICAL DATA:  Initial evaluation for intracranial mass. EXAM: MRI HEAD WITHOUT AND WITH CONTRAST TECHNIQUE: Multiplanar, multiecho pulse sequences of the brain and surrounding structures were obtained without and with  intravenous contrast. CONTRAST:  5mL GADAVIST  GADOBUTROL  1 MMOL/ML IV SOLN COMPARISON:  Prior CTs from earlier the same day. FINDINGS: Brain: Cerebral volume within normal limits. Patchy T2/FLAIR hyperintensity involving the periventricular deep white matter both cerebral hemispheres, consistent with chronic small vessel ischemic disease, moderately advanced in nature. No evidence for acute or subacute ischemia. No areas of chronic cortical infarction. No acute or chronic intracranial blood products. Round mass measuring 2.8 x 2.6 x 2.0 cm seen centered at the subcortical left frontal lobe (series 17, image 36). Lesion demonstrates avid peripheral postcontrast enhancement. Extensive T2/FLAIR signal intensity throughout the adjacent left frontal lobe, consistent with vasogenic edema and/or possible infiltrating nonenhancing tumor. Partial effacement of the left lateral ventricle with 9 mm of left-to-right shift. No hydrocephalus or trapping. Finding is nonspecific, and could reflect a primary CNS neoplasm versus solitary intracranial metastasis. No other mass lesion or abnormal enhancement. Pituitary gland suprasellar region within normal limits. Vascular: Major intracranial vascular flow voids are maintained. Skull and upper cervical spine: Craniocervical ungual limits. Bone marrow signal intensity normal. No scalp soft tissue abnormality. Sinuses/Orbits: Globes orbital soft tissues within normal limits. Paranasal sinuses are largely clear. No significant mastoid effusion. Other: None. IMPRESSION: 1. 2.8 x 2.6 x 2.0 cm enhancing mass centered at the subcortical left frontal lobe, with extensive surrounding vasogenic edema and 9 mm of left-to-right shift. Primary differential considerations include a solitary intracranial metastasis versus primary CNS neoplasm. 2. Underlying moderately advanced chronic microvascular ischemic disease. Electronically Signed   By: Morene Hoard M.D.   On: 09/18/2023 00:51   DG  Humerus Right Result Date: 09/17/2023 CLINICAL DATA:  Fall, right arm deformity EXAM: RIGHT HUMERUS - 2+ VIEW COMPARISON:  None Available. 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 Mild degenerative changes of the right shoulder. 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 Electronically Signed   By: Pinkie Pebbles M.D.   On: 09/17/2023 21:40   CT CHEST ABDOMEN PELVIS W CONTRAST Result Date: 09/17/2023 CLINICAL DATA:  Left frontal lobe mass, possible metastatic lesion, assessment for other metastatic disease or primary. * Tracking Code: BO * EXAM: CT CHEST, ABDOMEN, AND PELVIS WITH CONTRAST TECHNIQUE: Multidetector CT imaging of the chest, abdomen and pelvis was performed following the standard protocol during bolus administration of intravenous contrast. RADIATION DOSE REDUCTION: This exam was performed according to the departmental dose-optimization program which includes automated exposure control, adjustment of the mA and/or kV according to patient size and/or use of iterative reconstruction technique. CONTRAST:  60mL OMNIPAQUE  IOHEXOL  350 MG/ML SOLN COMPARISON:  05/25/2023 FINDINGS: CT CHEST FINDINGS Cardiovascular: Mild atheromatous vascular calcification of the thoracic aorta. Mediastinum/Nodes: Pathologic right supraclavicular, right paratracheal, bilateral  hilar, subcarinal, and bilateral infrahilar adenopathy. Index right paratracheal node 1.4 cm in short axis on image 17 series 3. Index left hilar node 1.3 cm in short axis on image 26 series 3. Lungs/Pleura: Substantial biapical pleuroparenchymal scarring. This has an asymmetric nodular component in the right upper lobe measuring 2.9 by 1.5 by 1.8 cm (volume = 4.1 cm^3) which could be from scarring or tumor. Extensive emphysema.  Honeycombing at the lung bases. Musculoskeletal: Unremarkable CT ABDOMEN PELVIS FINDINGS Hepatobiliary:  Unremarkable Pancreas: Unremarkable Spleen: Unremarkable Adrenals/Urinary Tract: There is dense contrast in the renal collecting systems, ureters, and urinary bladder due to recent injection for the CT angiogram. This lowers sensitivity for nonobstructive calculi. No renal or adrenal mass. No obvious abnormal filling defect along the urothelium. Stomach/Bowel: Mild prominence of stool in the rectal vault. Normal appendix. Otherwise unremarkable. Vascular/Lymphatic: Atherosclerosis is present, including aortoiliac atherosclerotic disease. Reproductive: Posterior positioning of the uterus with suspected uterine fibroids and associated calcifications. Otherwise unremarkable. Other: No supplemental non-categorized findings. Musculoskeletal: Unremarkable IMPRESSION: 1. Pathologic right supraclavicular, right paratracheal, bilateral hilar, subcarinal, and bilateral infrahilar adenopathy. 2. Substantial biapical pleuroparenchymal scarring. This has an asymmetric nodular component in the right upper lobe measuring 2.9 by 1.5 by 1.8 cm (volume = 4.1 cm^ 3) which could be from scarring or tumor. PET-CT could help differentiate. 3. Extensive emphysema. 4. Honeycombing at the lung bases, compatible with interstitial lung disease. 5. Mild prominence of stool in the rectal vault. 6. Posterior positioning of the uterus with suspected uterine fibroids and associated calcifications. 7. Aortic atherosclerosis. Aortic Atherosclerosis (ICD10-I70.0) and Emphysema (ICD10-J43.9). Electronically Signed   By: Ryan Salvage M.D.   On: 09/17/2023 19:23   CT ANGIO HEAD NECK W WO CM (CODE STROKE) Addendum Date: 09/17/2023 ADDENDUM REPORT: 09/17/2023 19:02 ADDENDUM: Delayed images demonstrate peripheral enhancement the mass measuring 2.6 x 2.3 x 2.1 cm. Other foci of enhancement are present. This most likely represents a solitary metastasis. Electronically Signed   By: Lonni Necessary M.D.   On: 09/17/2023 19:02   Result Date:  09/17/2023 CLINICAL DATA:  Code stroke.  Left frontal lobe mass. EXAM: CT ANGIOGRAPHY HEAD AND NECK WITH AND WITHOUT CONTRAST TECHNIQUE: Multidetector CT imaging of the head and neck was performed using the standard protocol during bolus administration of intravenous contrast. Multiplanar CT image reconstructions and MIPs were obtained to evaluate the vascular anatomy. Carotid stenosis measurements (when applicable) are obtained utilizing NASCET criteria, using the distal internal carotid diameter as the denominator. RADIATION DOSE REDUCTION: This exam was performed according to the departmental dose-optimization program which includes automated exposure control, adjustment of the mA and/or kV according to patient size and/or use of iterative reconstruction technique. CONTRAST:  75mL OMNIPAQUE  IOHEXOL  350 MG/ML SOLN COMPARISON:  CT head without contrast 09/17/2023 FINDINGS: CTA NECK FINDINGS Aortic arch: A 3 vessel arch configuration is present. The aortic arch is incompletely imaged on this study. No significant focal stenosis is present the great vessel origins. Right carotid system: Atherosclerotic calcifications are present at the right carotid bifurcation predominantly involving the external carotid artery. The right common carotid artery is normal. The cervical right ICA is otherwise normal. Left carotid system: The left common carotid artery is within normal limits. Mild noncalcified plaque is present posteriorly at the bifurcation without significant stenosis. The cervical left ICA is otherwise normal. Vertebral arteries: The left vertebral artery is dominant vessel. Both vertebral arteries originate from the subclavian arteries without significant stenosis. No significant stenosis is present in either vertebral artery in the  neck. Skeleton: Vertebral body heights and alignment are normal. No focal osseous lesions are present. The patient is edentulous. Other neck: Soft tissues the neck are otherwise  unremarkable. Salivary glands are within normal limits. Thyroid  is normal. No significant adenopathy is present. No focal mucosal or submucosal lesions are present. Upper chest: Central and paraseptal emphysematous changes are present. Peripheral lung nodules are present in the right upper lobe. Please see dedicated CT of the chest for further description. No pneumothorax is present. Paratracheal adenopathy is present. Review of the MIP images confirms the above findings CTA HEAD FINDINGS Anterior circulation: Internal carotid arteries are within normal limits the skull base to the ICA termini. The A1 and M1 segments are normal. Anterior communicating artery is patent. Mass effect deviates the left MCA and bilateral ACA branch vessels. No significant proximal stenosis or occlusion is present. Posterior circulation: The PICA origins are visualized and normal. Scratched at the left PICA origin is visualized and normal. The right PICA scratched at the hypoplastic right vertebral artery terminates at the PICA. The basilar artery is normal. The superior cerebellar arteries are patent. Posterior cerebral arteries originate from basilar tip. A right posterior communicating artery contributes. The PCA branch vessels are normal bilaterally. No aneurysm is present. Venous sinuses: The dural sinuses are patent. The straight sinus and deep cerebral veins are intact. Cortical veins are within normal limits. No significant vascular malformation is evident. Anatomic variants: None Review of the MIP images confirms the above findings IMPRESSION: 1. No emergent large vessel occlusion. 2. Mass effect deviates the left MCA and bilateral ACA branch vessels without significant proximal stenosis or occlusion. 3. Mild atherosclerotic changes at the carotid bifurcations bilaterally without significant stenosis. 4. Peripheral lung nodules in the right upper lobe. Please see dedicated CT of the chest for further description. 5. Paratracheal  adenopathy is related to metastatic disease. Please see dedicated CT of the chest for further description. 6.  Emphysema (ICD10-J43.9). Electronically Signed: By: Lonni Necessary M.D. On: 09/17/2023 18:53   CT HEAD CODE STROKE WO CONTRAST Result Date: 09/17/2023 CLINICAL DATA:  Code stroke. Neuro deficit, acute, stroke suspected. EXAM: CT HEAD WITHOUT CONTRAST TECHNIQUE: Contiguous axial images were obtained from the base of the skull through the vertex without intravenous contrast. RADIATION DOSE REDUCTION: This exam was performed according to the departmental dose-optimization program which includes automated exposure control, adjustment of the mA and/or kV according to patient size and/or use of iterative reconstruction technique. COMPARISON:  None Available. FINDINGS: Brain: A left frontal lobe mass measures 20 x 20 x 18 mm. Extensive surrounding vasogenic edema present throughout the left frontal lobe. Mass effect is present with effacement of the sulci and 8 mm of left to right midline shift. Asymmetric effacement of the left lateral ventricle is noted. No other discrete lesions are present. No acute cortical infarcts are present. The brainstem and cerebellum are within normal limits. Midline structures are within normal limits. Vascular: No hyperdense vessel or unexpected calcification. Skull: Calvarium is intact. No focal lytic or blastic lesions are present. No significant extracranial soft tissue lesion is present. Sinuses/Orbits: The paranasal sinuses and mastoid air cells are clear. The globes and orbits are within normal limits. IMPRESSION: 1. 20 x 20 x 18 mm left frontal lobe mass with extensive surrounding vasogenic edema. This is concerning for a primary or metastatic neoplasm. Recommend MRI the brain without and with contrast for further evaluation. 2. Mass effect with effacement of the sulci and 8 mm of left to right  midline shift. 3. No other discrete lesions are present. 4. No other acute  intracranial abnormality. The above was relayed via text pager to Dr. ELIGIO LAV on 09/17/2023 at 18:25 . Electronically Signed   By: Lonni Necessary M.D.   On: 09/17/2023 18:25   DG Chest Portable 1 View Result Date: 09/17/2023 CLINICAL DATA:  Tachycardia. EXAM: PORTABLE CHEST 1 VIEW COMPARISON:  Radiograph 05/25/2023 FINDINGS: The heart is normal in size. Stable mediastinal contours. Chronic hyperinflation and emphysema. There is biapical pleuroparenchymal scarring, similar. Reticular opacities at both lung bases are without significant interval change, typical of honeycombing. No definite acute airspace disease. No features of pulmonary edema. No pneumothorax or significant pleural effusion. IMPRESSION: 1. Chronic hyperinflation and emphysema. 2. Bibasilar reticular opacities, typical of honeycombing, without significant interval change. 3. Biapical pleuroparenchymal scarring. Electronically Signed   By: Andrea Gasman M.D.   On: 09/17/2023 18:02   Scheduled Meds:  dexamethasone  (DECADRON ) injection  4 mg Intravenous Q6H   sodium chloride  flush  3 mL Intravenous Q12H   Continuous Infusions:  levETIRAcetam  500 mg (09/18/23 2057)     LOS: 1 day   Time spent:  Elsie JAYSON [place], DO [facility]  If 7PM-7AM, please contact night-coverage [email or web address]  09/19/2023, 7:03 AM

## 2023-09-19 NOTE — Procedures (Signed)
 Vascular and Interventional Radiology Procedure Note  Patient: Regina [name] DOB: 11/05/1965 Medical Record Number: [id number] Note Date/Time: 09/19/23 4:11 PM   Performing Physician: Thom Hall, MD Assistant(s): None  Diagnosis: RUE pathologic fracture  Procedure: RIGHT UPPER EXTREMITY MASS BIOPSY  Anesthesia: Conscious Sedation Complications: None Estimated Blood Loss: Minimal Specimens: Sent for Pathology  Findings:  Successful Ultrasound-guided biopsy of RUE mass at pathologic fx site. A total of 3 samples were obtained. Hemostasis of the tract was achieved using Manual Pressure.  Plan: Bed rest for 1 hours.  See detailed procedure note with images in PACS. The patient tolerated the procedure well without incident or complication and was returned to Recovery in stable condition.    Thom Hall, MD [facility] Radiology   Pager. [phone] Clinic. [phone]

## 2023-09-19 NOTE — Consult Note (Addendum)
 Chief Complaint: Patient was seen in consultation today for right arm mass; biopsy planned Chief Complaint  Patient presents with   Tachycardia   at the request of Dr CHRISTELLA Ada  Supervising Physician: Hughes Simmonds  Patient Status: [facility] - In-pt  History of Present Illness: Regina [name] is a 58 y.o. 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  IR asked to review imaging for biopsy Dr Hughes has approved Rt arm mass biopsy Planned for today in IR   Past Medical History:  Diagnosis Date   Allergy    Anxiety    COPD (chronic obstructive pulmonary disease) (HCC)    Emphysema of lung (HCC)    Hypertension     Past Surgical History:  Procedure Laterality Date   dental procedure      Allergies: Pork allergy and Bee venom  Medications: Prior to Admission medications   Medication Sig Start Date End Date Taking? Authorizing Provider  albuterol  (VENTOLIN  HFA) 108 (90 Base) MCG/ACT inhaler INHALE 2 PUFFS INTO THE LUNGS EVERY 6 (SIX) HOURS AS NEEDED FOR WHEEZING OR SHORTNESS OF BREATH. Patient not taking: Reported on 09/17/2023 06/08/23   Celestia Rosaline SQUIBB, NP  amLODipine  (NORVASC ) 10 MG tablet Take 1 tablet (10 mg total) by mouth daily. Patient not taking: Reported on 09/17/2023 04/19/22   Celestia Rosaline SQUIBB, NP  aspirin  EC 81 MG tablet Take 81 mg by mouth daily. Patient not taking: Reported on 09/17/2023    [provider]  Cholecalciferol (VITAMIN D3) 125 MCG (5000 UT) TABS Take 1 tab ONCE A DAY for vitamin insufficiency Patient not taking: Reported on 09/17/2023 03/25/22     fluticasone  (FLONASE ) 50 MCG/ACT nasal spray Place 2 sprays into both nostrils daily. Patient not taking: Reported on 09/17/2023 12/24/20   Celestia Rosaline SQUIBB, NP  ibuprofen  (ADVIL ) 600 MG tablet Take 1  tablet (600 mg total) by mouth every 6 (six) hours as needed. Patient not taking: Reported on 09/17/2023 08/09/23   Blaise Aleene KIDD, MD  Multiple Vitamin (MULTI-VITAMIN) tablet Take 1 tablet by mouth daily. Patient not taking: Reported on 09/17/2023 05/28/23     sodium chloride  1 g tablet Take 1 tablet (1 g total) by mouth in the morning and 1 tablet (1 g total) at noon and 1 tablet (1 g total) in the evening. Take with meals. Patient not taking: Reported on 09/17/2023 05/28/23     thiamine  (VITAMIN B1) 100 MG tablet Take 1 tablet (100 mg total) by mouth daily. Patient not taking: Reported on 09/17/2023 05/28/23     traZODone  (DESYREL ) 50 MG tablet Take 1 tablet (50 mg total) by mouth at bedtime as needed. Patient not taking: Reported on 09/17/2023 10/04/22        Family History  Problem Relation Age of Onset   Leukemia Mother    Hypertension Mother    Hypertension Father    Cancer Neg Hx    Diabetes Neg Hx    Heart disease Neg Hx    Colon cancer Neg Hx    Esophageal cancer Neg Hx    Rectal cancer Neg Hx    Stomach cancer Neg Hx     Social History   Socioeconomic History   Marital status: Widowed    Spouse name: Not on file   Number of children: 1  Years of education: 13    Highest education level: Not on file  Occupational History   Occupation: Set Designer: [employer]    Comment: Nurse, Mental Health   Tobacco Use   Smoking status: Former    Current packs/day: 0.00    Types: Cigarettes    Quit date: 10/31/2019    Years since quitting: 3.8   Smokeless tobacco: Never  Vaping Use   Vaping status: Never Used  Substance and Sexual Activity   Alcohol  use: Yes    Alcohol /week: 6.0 standard drinks of alcohol     Types: 6 Cans of beer per week   Drug use: Yes    Frequency: 4.0 times per week    Types: Marijuana   Sexual activity: Not Currently  Other Topics Concern   Not on file  Social History Narrative   Lives with her brother.    Son lives near by,  81 yo, married.    Social Drivers of Corporate Investment Banker Strain: Not on file  Food Insecurity: Patient Declined (09/18/2023)   Hunger Vital Sign    Worried About Running Out of Food in the Last Year: Patient declined    Ran Out of Food in the Last Year: Patient declined  Transportation Needs: Patient Declined (09/18/2023)   PRAPARE - Administrator, Civil Service (Medical): Patient declined    Lack of Transportation (Non-Medical): Patient declined  Physical Activity: Not on file  Stress: Not on file  Social Connections: Not on file    Review of Systems: A 12 point ROS discussed and pertinent positives are indicated in the HPI above.  All other systems are negative.  Review of Systems  Constitutional:  Positive for activity change. Negative for fatigue.  HENT:  Negative for trouble swallowing and voice change.   Respiratory:  Negative for shortness of breath and wheezing.   Cardiovascular:  Negative for chest pain.  Gastrointestinal:  Negative for abdominal pain.  Musculoskeletal:  Negative for gait problem.  Neurological:  Negative for light-headedness and headaches.       Expressive aphasia  Psychiatric/Behavioral:  Negative for agitation and behavioral problems.     Vital Signs: BP (!) 149/92 (BP Location: Left Arm)   Pulse 85   Temp 98.1 F (36.7 C) (Oral)   Resp 20   Ht 5' 6 (1.676 m)   Wt 115 lb 11.9 oz (52.5 kg)   LMP 06/06/2010   SpO2 95%   BMI 18.68 kg/m     Physical Exam Vitals reviewed.  HENT:     Mouth/Throat:     Mouth: Mucous membranes are moist.  Cardiovascular:     Rate and Rhythm: Normal rate and regular rhythm.     Heart sounds: Normal heart sounds.  Pulmonary:     Breath sounds: Normal breath sounds.  Abdominal:     Palpations: Abdomen is soft.     Tenderness: There is no abdominal tenderness.  Musculoskeletal:        General: Normal range of motion.  Skin:    General: Skin is warm.  Neurological:     Mental Status:  She is alert and oriented to person, place, and time.     Comments: Can answer all questions---- some take time to think A/O; appropriate Niece Regina [name] agreed to procedure via phone--- planned for today  Psychiatric:        Behavior: Behavior normal.     Imaging: EEG adult Result Date: 09/18/2023 Yadav, Priyanka  MALVA, MD     09/18/2023  8:44 AM Patient Name: Phaedra Colgate MRN: [id number] Epilepsy Attending: Arlin MALVA Krebs Referring Physician/Provider: Voncile Isles, MD Date: 09/17/2023 Duration: 22.31 mins Patient history: 58 y.o. female with medical history significant for COPD, depression, anxiety, and hypertension, presenting to the emergency department with aphasia, right sided weakness, and right arm pain. EEG to evaluate for seizure Level of alertness: Awake AEDs during EEG study: LEV Technical aspects: This EEG study was done with scalp electrodes positioned according to the 10-20 International system of electrode placement. Electrical activity was reviewed with band pass filter of 1-70Hz , sensitivity of 7 uV/mm, display speed of 59mm/sec with a 60Hz  notched filter applied as appropriate. EEG data were recorded continuously and digitally stored.  Video monitoring was available and reviewed as appropriate. Description: The posterior dominant rhythm consists of 8 Hz activity of moderate voltage (25-35 uV) seen predominantly in posterior head regions, symmetric and reactive to eye opening and eye closing. EEG showed continuous polymorphic 3 to 6 Hz theta-delta slowing in left fronto-temporal region. Hyperventilation and photic stimulation were not performed.   ABNORMALITY - Continuous slow, left fronto-temporal IMPRESSION: This study is suggestive of cortical dysfunction arising from left fronto-temporal region likely secondary to underlying structural abnormality. No seizures or epileptiform discharges were seen throughout the recording. Arlin MALVA Krebs   MR BRAIN W WO CONTRAST Result Date:  09/18/2023 CLINICAL DATA:  Initial evaluation for intracranial mass. EXAM: MRI HEAD WITHOUT AND WITH CONTRAST TECHNIQUE: Multiplanar, multiecho pulse sequences of the brain and surrounding structures were obtained without and with intravenous contrast. CONTRAST:  5mL GADAVIST  GADOBUTROL  1 MMOL/ML IV SOLN COMPARISON:  Prior CTs from earlier the same day. FINDINGS: Brain: Cerebral volume within normal limits. Patchy T2/FLAIR hyperintensity involving the periventricular deep white matter both cerebral hemispheres, consistent with chronic small vessel ischemic disease, moderately advanced in nature. No evidence for acute or subacute ischemia. No areas of chronic cortical infarction. No acute or chronic intracranial blood products. Round mass measuring 2.8 x 2.6 x 2.0 cm seen centered at the subcortical left frontal lobe (series 17, image 36). Lesion demonstrates avid peripheral postcontrast enhancement. Extensive T2/FLAIR signal intensity throughout the adjacent left frontal lobe, consistent with vasogenic edema and/or possible infiltrating nonenhancing tumor. Partial effacement of the left lateral ventricle with 9 mm of left-to-right shift. No hydrocephalus or trapping. Finding is nonspecific, and could reflect a primary CNS neoplasm versus solitary intracranial metastasis. No other mass lesion or abnormal enhancement. Pituitary gland suprasellar region within normal limits. Vascular: Major intracranial vascular flow voids are maintained. Skull and upper cervical spine: Craniocervical ungual limits. Bone marrow signal intensity normal. No scalp soft tissue abnormality. Sinuses/Orbits: Globes orbital soft tissues within normal limits. Paranasal sinuses are largely clear. No significant mastoid effusion. Other: None. IMPRESSION: 1. 2.8 x 2.6 x 2.0 cm enhancing mass centered at the subcortical left frontal lobe, with extensive surrounding vasogenic edema and 9 mm of left-to-right shift. Primary differential considerations  include a solitary intracranial metastasis versus primary CNS neoplasm. 2. Underlying moderately advanced chronic microvascular ischemic disease. Electronically Signed   By: Morene Hoard M.D.   On: 09/18/2023 00:51   DG Humerus Right Result Date: 09/17/2023 CLINICAL DATA:  Fall, right arm deformity EXAM: RIGHT HUMERUS - 2+ VIEW COMPARISON:  None Available. 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 Mild degenerative changes of the right shoulder. 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 Electronically  Signed   By: Pinkie Pebbles M.D.   On: 09/17/2023 21:40   CT CHEST ABDOMEN PELVIS W CONTRAST Result Date: 09/17/2023 CLINICAL DATA:  Left frontal lobe mass, possible metastatic lesion, assessment for other metastatic disease or primary. * Tracking Code: BO * EXAM: CT CHEST, ABDOMEN, AND PELVIS WITH CONTRAST TECHNIQUE: Multidetector CT imaging of the chest, abdomen and pelvis was performed following the standard protocol during bolus administration of intravenous contrast. RADIATION DOSE REDUCTION: This exam was performed according to the departmental dose-optimization program which includes automated exposure control, adjustment of the mA and/or kV according to patient size and/or use of iterative reconstruction technique. CONTRAST:  60mL OMNIPAQUE  IOHEXOL  350 MG/ML SOLN COMPARISON:  05/25/2023 FINDINGS: CT CHEST FINDINGS Cardiovascular: Mild atheromatous vascular calcification of the thoracic aorta. Mediastinum/Nodes: Pathologic right supraclavicular, right paratracheal, bilateral hilar, subcarinal, and bilateral infrahilar adenopathy. Index right paratracheal node 1.4 cm in short axis on image 17 series 3. Index left hilar node 1.3 cm in short axis on image 26 series 3. Lungs/Pleura: Substantial biapical pleuroparenchymal scarring. This has an asymmetric nodular component in the right  upper lobe measuring 2.9 by 1.5 by 1.8 cm (volume = 4.1 cm^3) which could be from scarring or tumor. Extensive emphysema.  Honeycombing at the lung bases. Musculoskeletal: Unremarkable CT ABDOMEN PELVIS FINDINGS Hepatobiliary: Unremarkable Pancreas: Unremarkable Spleen: Unremarkable Adrenals/Urinary Tract: There is dense contrast in the renal collecting systems, ureters, and urinary bladder due to recent injection for the CT angiogram. This lowers sensitivity for nonobstructive calculi. No renal or adrenal mass. No obvious abnormal filling defect along the urothelium. Stomach/Bowel: Mild prominence of stool in the rectal vault. Normal appendix. Otherwise unremarkable. Vascular/Lymphatic: Atherosclerosis is present, including aortoiliac atherosclerotic disease. Reproductive: Posterior positioning of the uterus with suspected uterine fibroids and associated calcifications. Otherwise unremarkable. Other: No supplemental non-categorized findings. Musculoskeletal: Unremarkable IMPRESSION: 1. Pathologic right supraclavicular, right paratracheal, bilateral hilar, subcarinal, and bilateral infrahilar adenopathy. 2. Substantial biapical pleuroparenchymal scarring. This has an asymmetric nodular component in the right upper lobe measuring 2.9 by 1.5 by 1.8 cm (volume = 4.1 cm^ 3) which could be from scarring or tumor. PET-CT could help differentiate. 3. Extensive emphysema. 4. Honeycombing at the lung bases, compatible with interstitial lung disease. 5. Mild prominence of stool in the rectal vault. 6. Posterior positioning of the uterus with suspected uterine fibroids and associated calcifications. 7. Aortic atherosclerosis. Aortic Atherosclerosis (ICD10-I70.0) and Emphysema (ICD10-J43.9). Electronically Signed   By: Ryan Salvage M.D.   On: 09/17/2023 19:23   CT ANGIO HEAD NECK W WO CM (CODE STROKE) Addendum Date: 09/17/2023 ADDENDUM REPORT: 09/17/2023 19:02 ADDENDUM: Delayed images demonstrate peripheral enhancement  the mass measuring 2.6 x 2.3 x 2.1 cm. Other foci of enhancement are present. This most likely represents a solitary metastasis. Electronically Signed   By: Lonni Necessary M.D.   On: 09/17/2023 19:02   Result Date: 09/17/2023 CLINICAL DATA:  Code stroke.  Left frontal lobe mass. EXAM: CT ANGIOGRAPHY HEAD AND NECK WITH AND WITHOUT CONTRAST TECHNIQUE: Multidetector CT imaging of the head and neck was performed using the standard protocol during bolus administration of intravenous contrast. Multiplanar CT image reconstructions and MIPs were obtained to evaluate the vascular anatomy. Carotid stenosis measurements (when applicable) are obtained utilizing NASCET criteria, using the distal internal carotid diameter as the denominator. RADIATION DOSE REDUCTION: This exam was performed according to the departmental dose-optimization program which includes automated exposure control, adjustment of the mA and/or kV according to patient size and/or use of iterative reconstruction technique.  CONTRAST:  75mL OMNIPAQUE  IOHEXOL  350 MG/ML SOLN COMPARISON:  CT head without contrast 09/17/2023 FINDINGS: CTA NECK FINDINGS Aortic arch: A 3 vessel arch configuration is present. The aortic arch is incompletely imaged on this study. No significant focal stenosis is present the great vessel origins. Right carotid system: Atherosclerotic calcifications are present at the right carotid bifurcation predominantly involving the external carotid artery. The right common carotid artery is normal. The cervical right ICA is otherwise normal. Left carotid system: The left common carotid artery is within normal limits. Mild noncalcified plaque is present posteriorly at the bifurcation without significant stenosis. The cervical left ICA is otherwise normal. Vertebral arteries: The left vertebral artery is dominant vessel. Both vertebral arteries originate from the subclavian arteries without significant stenosis. No significant stenosis is  present in either vertebral artery in the neck. Skeleton: Vertebral body heights and alignment are normal. No focal osseous lesions are present. The patient is edentulous. Other neck: Soft tissues the neck are otherwise unremarkable. Salivary glands are within normal limits. Thyroid  is normal. No significant adenopathy is present. No focal mucosal or submucosal lesions are present. Upper chest: Central and paraseptal emphysematous changes are present. Peripheral lung nodules are present in the right upper lobe. Please see dedicated CT of the chest for further description. No pneumothorax is present. Paratracheal adenopathy is present. Review of the MIP images confirms the above findings CTA HEAD FINDINGS Anterior circulation: Internal carotid arteries are within normal limits the skull base to the ICA termini. The A1 and M1 segments are normal. Anterior communicating artery is patent. Mass effect deviates the left MCA and bilateral ACA branch vessels. No significant proximal stenosis or occlusion is present. Posterior circulation: The PICA origins are visualized and normal. Scratched at the left PICA origin is visualized and normal. The right PICA scratched at the hypoplastic right vertebral artery terminates at the PICA. The basilar artery is normal. The superior cerebellar arteries are patent. Posterior cerebral arteries originate from basilar tip. A right posterior communicating artery contributes. The PCA branch vessels are normal bilaterally. No aneurysm is present. Venous sinuses: The dural sinuses are patent. The straight sinus and deep cerebral veins are intact. Cortical veins are within normal limits. No significant vascular malformation is evident. Anatomic variants: None Review of the MIP images confirms the above findings IMPRESSION: 1. No emergent large vessel occlusion. 2. Mass effect deviates the left MCA and bilateral ACA branch vessels without significant proximal stenosis or occlusion. 3. Mild  atherosclerotic changes at the carotid bifurcations bilaterally without significant stenosis. 4. Peripheral lung nodules in the right upper lobe. Please see dedicated CT of the chest for further description. 5. Paratracheal adenopathy is related to metastatic disease. Please see dedicated CT of the chest for further description. 6.  Emphysema (ICD10-J43.9). Electronically Signed: By: Lonni Necessary M.D. On: 09/17/2023 18:53   CT HEAD CODE STROKE WO CONTRAST Result Date: 09/17/2023 CLINICAL DATA:  Code stroke. Neuro deficit, acute, stroke suspected. EXAM: CT HEAD WITHOUT CONTRAST TECHNIQUE: Contiguous axial images were obtained from the base of the skull through the vertex without intravenous contrast. RADIATION DOSE REDUCTION: This exam was performed according to the departmental dose-optimization program which includes automated exposure control, adjustment of the mA and/or kV according to patient size and/or use of iterative reconstruction technique. COMPARISON:  None Available. FINDINGS: Brain: A left frontal lobe mass measures 20 x 20 x 18 mm. Extensive surrounding vasogenic edema present throughout the left frontal lobe. Mass effect is present with effacement of the sulci  and 8 mm of left to right midline shift. Asymmetric effacement of the left lateral ventricle is noted. No other discrete lesions are present. No acute cortical infarcts are present. The brainstem and cerebellum are within normal limits. Midline structures are within normal limits. Vascular: No hyperdense vessel or unexpected calcification. Skull: Calvarium is intact. No focal lytic or blastic lesions are present. No significant extracranial soft tissue lesion is present. Sinuses/Orbits: The paranasal sinuses and mastoid air cells are clear. The globes and orbits are within normal limits. IMPRESSION: 1. 20 x 20 x 18 mm left frontal lobe mass with extensive surrounding vasogenic edema. This is concerning for a primary or metastatic  neoplasm. Recommend MRI the brain without and with contrast for further evaluation. 2. Mass effect with effacement of the sulci and 8 mm of left to right midline shift. 3. No other discrete lesions are present. 4. No other acute intracranial abnormality. The above was relayed via text pager to Dr. ELIGIO LAV on 09/17/2023 at 18:25 . Electronically Signed   By: Lonni Necessary M.D.   On: 09/17/2023 18:25   DG Chest Portable 1 View Result Date: 09/17/2023 CLINICAL DATA:  Tachycardia. EXAM: PORTABLE CHEST 1 VIEW COMPARISON:  Radiograph 05/25/2023 FINDINGS: The heart is normal in size. Stable mediastinal contours. Chronic hyperinflation and emphysema. There is biapical pleuroparenchymal scarring, similar. Reticular opacities at both lung bases are without significant interval change, typical of honeycombing. No definite acute airspace disease. No features of pulmonary edema. No pneumothorax or significant pleural effusion. IMPRESSION: 1. Chronic hyperinflation and emphysema. 2. Bibasilar reticular opacities, typical of honeycombing, without significant interval change. 3. Biapical pleuroparenchymal scarring. Electronically Signed   By: Andrea Gasman M.D.   On: 09/17/2023 18:02    Labs:  CBC: Recent Labs    09/17/23 1715 09/17/23 1810 09/18/23 0620 09/19/23 0405  WBC 9.2  --  4.9 12.9*  HGB 14.9 16.3* 14.6 13.9  HCT 43.9 48.0* 43.0 40.0  PLT 372  --  360 329    COAGS: Recent Labs    09/17/23 1800 09/17/23 1930  APTT 28 28    BMP: Recent Labs    09/17/23 1715 09/17/23 1810 09/18/23 0620 09/19/23 0405  NA 134* 135 135 131*  K 3.6 3.1* 4.3 3.8  CL 102 98 103 101  CO2 18*  --  19* 18*  GLUCOSE 80 80 116* 149*  BUN 10 10 8 12   CALCIUM  10.3  --  9.2 9.0  CREATININE 0.73 0.70 0.71 0.55  GFRNONAA >60  --  >60 >60    LIVER FUNCTION TESTS: Recent Labs    09/18/23 0620  BILITOT 0.8  AST 23  ALT 13  ALKPHOS 98  PROT 7.2  ALBUMIN 3.6    TUMOR MARKERS: No results for  input(s): AFPTM, CEA, CA199, CHROMGRNA in the last 8760 hours.  Assessment and Plan:  Rt humeral mass New intracranial mass; CVA; aphasia Scheduled for right humeral mass biopsy in IR - planned for today Risks and benefits of right humeral mass biopsy was discussed with the patient and/or patient's family (niece Regina [name] via phone) including, but not limited to bleeding, infection, damage to adjacent structures or low yield requiring additional tests.  All of the questions were answered and there is agreement to proceed.  Consent signed and in chart.   Thank you for this interesting consult.  I greatly enjoyed meeting Regina [name] and look forward to participating in their care.  A copy of this report was sent to the  requesting provider on this date.  Electronically Signed: Sharlet DELENA Candle, PA-C 09/19/2023, 9:29 AM   I spent a total of 40 Minutes    in face to face in clinical consultation, greater than 50% of which was counseling/coordinating care for right humeral mass biopsy

## 2023-09-19 NOTE — Progress Notes (Signed)
 Pt back to the unit from IR. Pt alert and verbally responsive. VSS, telemetry confirmed with CCMD. RUE bandaid clean dry and intact. No bleeding or stain noted. Neurovascular check unchanged and intact to RUE. RUE remains in a sling. MYRTIS Val Roof RN   09/19/23 1552  Vitals  Temp 98.6 F (37 C)  Temp Source Oral  BP (!) 156/94  MAP (mmHg) 113  BP Location Left Arm  BP Method Automatic  Patient Position (if appropriate) Lying  Pulse Rate 65  Pulse Rate Source Monitor  ECG Heart Rate 67  Resp 18  MEWS COLOR  MEWS Score Color Green  Oxygen Therapy  SpO2 94 %  O2 Device Room Air  Pain Assessment  Pain Scale 0-10  Pain Score 0  MEWS Score  MEWS Temp 0  MEWS Systolic 0  MEWS Pulse 0  MEWS RR 0  MEWS LOC 0  MEWS Score 0

## 2023-09-20 ENCOUNTER — Other Ambulatory Visit: Payer: Self-pay | Admitting: Radiation Therapy

## 2023-09-20 ENCOUNTER — Other Ambulatory Visit: Payer: Self-pay

## 2023-09-20 DIAGNOSIS — G9389 Other specified disorders of brain: Secondary | ICD-10-CM | POA: Diagnosis not present

## 2023-09-20 LAB — BASIC METABOLIC PANEL
Anion gap: 10 (ref 5–15)
BUN: 15 mg/dL (ref 6–20)
CO2: 21 mmol/L — ABNORMAL LOW (ref 22–32)
Calcium: 9.7 mg/dL (ref 8.9–10.3)
Chloride: 102 mmol/L (ref 98–111)
Creatinine, Ser: 0.65 mg/dL (ref 0.44–1.00)
GFR, Estimated: >60 mL/min (ref >60)
Glucose, Bld: 122 mg/dL — ABNORMAL HIGH (ref 70–99)
Potassium: 4.3 mmol/L (ref 3.5–5.1)
Sodium: 133 mmol/L — ABNORMAL LOW (ref 135–145)

## 2023-09-20 LAB — CBC
HCT: 44.3 % (ref 36.0–46.0)
Hemoglobin: 15.3 g/dL — ABNORMAL HIGH (ref 12.0–15.0)
MCH: 29.7 pg (ref 26.0–34.0)
MCHC: 34.5 g/dL (ref 30.0–36.0)
MCV: 86 fL (ref 80.0–100.0)
Platelets: 328 10*3/uL (ref 150–400)
RBC: 5.15 MIL/uL — ABNORMAL HIGH (ref 3.87–5.11)
RDW: 14.2 % (ref 11.5–15.5)
WBC: 17.3 10*3/uL — ABNORMAL HIGH (ref 4.0–10.5)
nRBC: 0 % (ref 0.0–0.2)

## 2023-09-20 MED ORDER — LEVETIRACETAM 500 MG PO TABS
500.0000 mg | ORAL_TABLET | Freq: Two times a day (BID) | ORAL | 0 refills | Status: DC
  Filled 2023-09-20: qty 60, 30d supply, fill #0

## 2023-09-20 MED ORDER — OXYCODONE HCL 5 MG PO TABS
5.0000 mg | ORAL_TABLET | ORAL | 0 refills | Status: DC | PRN
  Filled 2023-09-20: qty 30, 5d supply, fill #0

## 2023-09-20 MED ORDER — DEXAMETHASONE 2 MG PO TABS
ORAL_TABLET | ORAL | 0 refills | Status: DC
  Filled 2023-09-20: qty 30, 11d supply, fill #0

## 2023-09-20 MED ORDER — ACETAMINOPHEN 325 MG PO TABS
650.0000 mg | ORAL_TABLET | Freq: Four times a day (QID) | ORAL | 0 refills | Status: DC | PRN
  Filled 2023-09-20: qty 30, 4d supply, fill #0

## 2023-09-20 NOTE — Progress Notes (Signed)
 Discharge instructions reviewed with pt. Copy of instructions given to pt. Pt informed her scripts were sent to her pharmacy for pick up. Pt has called for her ride she states.  Pt to be d/c'd via wheelchair with belongings and will be escorted by staff or hospital volunteer.   Rachel Samples,RN SWOT

## 2023-09-20 NOTE — Plan of Care (Signed)

## 2023-09-20 NOTE — Discharge Summary (Signed)
 Physician Discharge Summary  Regina [name] FMW:[id number] DOB: [birth year] DOA: 09/17/2023  PCP: Celestia Rosaline SQUIBB, NP  Admit date: 09/17/2023 Discharge date: 09/20/2023  Admitted From: Home Disposition:  Home  Recommendations for Outpatient Follow-up:  Follow up with PCP in 1-2 weeks Please follow-up with oncology and neurosurgery as scheduled  Home Health: None Equipment/Devices: Bedside commode  Discharge Condition: Stable CODE STATUS: Full Diet recommendation: Regular diet as tolerated  Brief/Interim Summary: Regina [name] is a 58 y.o. female with medical history significant for COPD, depression, anxiety, and hypertension, presenting to the emergency department with aphasia, right sided weakness, and right arm pain.    At intake imaging was remarkable for right sided humeral fracture, likely pathologic as well as notable right upper lobe/apical opacification concerning for mass as well as large frontal lobe mass with midline shift.  Hospitalist called for admission, neurosurgery and orthopedic surgery called in consult.  Patient drastically improving on steroids, speech patterns and word finding is much easier per the patient.  Biopsy obtained 09/19/2023, awaiting results for outpatient follow-up with oncology and neurosurgery.  Right humeral fracture to be further evaluated by orthopedic surgery once biopsy results are back.  At this time she is stable and agreeable for discharge home, medication changes as below.  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 [facility])   Pathological fracture in neoplastic disease, right humerus, initial encounter for fracture    Discharge Instructions  Discharge Instructions     Call MD for:  difficulty breathing, headache or visual  disturbances   Complete by: As directed    Call MD for:  extreme fatigue   Complete by: As directed    Call MD for:  hives   Complete by: As directed    Call MD for:  persistant dizziness or light-headedness   Complete by: As directed    Call MD for:  persistant nausea and vomiting   Complete by: As directed    Call MD for:  severe uncontrolled pain   Complete by: As directed    Diet - low sodium heart healthy   Complete by: As directed    Discharge instructions   Complete by: As directed    Take all medications as prescribed, follow-up closely for biopsy results with PCP, attempted to schedule oncology follow-up as well, expect a phone call from their office in the next week.  Follow-up with neurosurgery per their recommendations.   Increase activity slowly   Complete by: As directed    No wound care   Complete by: As directed       Allergies as of 09/20/2023       Reactions   Pork Allergy Nausea And Vomiting   Bee Venom Hives        Medication List     STOP taking these medications    albuterol  108 (90 Base) MCG/ACT inhaler Commonly known as: VENTOLIN  HFA   amLODipine  10 MG tablet Commonly known as: NORVASC    aspirin  EC 81 MG tablet   Daily-Vite Multivitamin Tabs   fluticasone  50 MCG/ACT nasal spray Commonly known as: FLONASE    ibuprofen  600 MG tablet Commonly known as: ADVIL    sodium chloride  1 g tablet   thiamine  100 MG tablet Commonly known as: VITAMIN B1   traZODone  50 MG tablet Commonly known as: DESYREL   Vitamin D3 125 MCG (5000 UT) Tabs       TAKE these medications    acetaminophen  325 MG tablet Commonly known as: TYLENOL  Take 2 tablets (650 mg total) by mouth every 6 (six) hours as needed for mild pain (pain score 1-3) (or Fever >/= 101).   dexamethasone  2 MG tablet Commonly known as: DECADRON  Take 2 tablets (4 mg total) by mouth 2 (two) times daily with a meal for 4 days, THEN 1 tablet (2 mg total) 2 (two) times daily with a meal  for 7 days. Start taking on: September 20, 2023   levETIRAcetam  500 MG tablet Commonly known as: Keppra  Take 1 tablet (500 mg total) by mouth 2 (two) times daily.   oxyCODONE  5 MG immediate release tablet Commonly known as: Roxicodone  Take 1 tablet (5 mg total) by mouth every 4 (four) hours as needed for severe pain (pain score 7-10).               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 female with medical history significant for COPD, depression, anxiety, and hypertension, presenting to the emergency department with aphasia, right sided weakness, and right arm pain. EEG to evaluate for seizure Level of alertness: Awake AEDs during EEG study: LEV Technical aspects: This EEG study was done with scalp electrodes positioned according to the 10-20 International system of electrode placement. Electrical activity was reviewed with band pass filter of 1-70Hz , sensitivity of 7 uV/mm, display speed of 69mm/sec with a 60Hz  notched filter applied as appropriate. EEG data were recorded continuously and digitally stored.  Video monitoring was available and reviewed as appropriate. Description: The posterior dominant rhythm consists of 8 Hz activity of moderate voltage  (25-35 uV) seen predominantly in posterior head regions, symmetric and reactive to eye opening and eye closing. EEG showed continuous polymorphic 3 to 6 Hz theta-delta slowing in left fronto-temporal region. Hyperventilation and photic stimulation were not performed.   ABNORMALITY - Continuous slow, left fronto-temporal IMPRESSION: This study is suggestive of cortical dysfunction arising from left fronto-temporal region likely secondary to underlying structural abnormality. No seizures or epileptiform discharges were seen throughout the recording. Arlin [name] Regina   MR BRAIN W WO CONTRAST Result Date: 09/18/2023 CLINICAL DATA:  Initial evaluation for intracranial mass. EXAM: MRI HEAD WITHOUT AND WITH CONTRAST TECHNIQUE: Multiplanar, multiecho pulse sequences of the brain and surrounding structures were obtained without and with intravenous contrast. CONTRAST:  5mL GADAVIST  GADOBUTROL  1 MMOL/ML IV SOLN COMPARISON:  Prior CTs from earlier the same day. FINDINGS: Brain: Cerebral volume within normal limits. Patchy T2/FLAIR hyperintensity involving the periventricular deep white matter both cerebral hemispheres, consistent with chronic small  vessel ischemic disease, moderately advanced in nature. No evidence for acute or subacute ischemia. No areas of chronic cortical infarction. No acute or chronic intracranial blood products. Round mass measuring 2.8 x 2.6 x 2.0 cm seen centered at the subcortical left frontal lobe (series 17, image 36). Lesion demonstrates avid peripheral postcontrast enhancement. Extensive T2/FLAIR signal intensity throughout the adjacent left frontal lobe, consistent with vasogenic edema and/or possible infiltrating nonenhancing tumor. Partial effacement of the left lateral ventricle with 9 mm of left-to-right shift. No hydrocephalus or trapping. Finding is nonspecific, and could reflect a primary CNS neoplasm versus solitary intracranial metastasis. No other mass lesion or abnormal enhancement.  Pituitary gland suprasellar region within normal limits. Vascular: Major intracranial vascular flow voids are maintained. Skull and upper cervical spine: Craniocervical ungual limits. Bone marrow signal intensity normal. No scalp soft tissue abnormality. Sinuses/Orbits: Globes orbital soft tissues within normal limits. Paranasal sinuses are largely clear. No significant mastoid effusion. Other: None. IMPRESSION: 1. 2.8 x 2.6 x 2.0 cm enhancing mass centered at the subcortical left frontal lobe, with extensive surrounding vasogenic edema and 9 mm of left-to-right shift. Primary differential considerations include a solitary intracranial metastasis versus primary CNS neoplasm. 2. Underlying moderately advanced chronic microvascular ischemic disease. Electronically Signed   By: Morene Hoard M.D.   On: 09/18/2023 00:51   DG Humerus Right Result Date: 09/17/2023 CLINICAL DATA:  Fall, right arm deformity EXAM: RIGHT HUMERUS - 2+ VIEW COMPARISON:  None Available. 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 Mild degenerative changes of the right shoulder. 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 Electronically Signed   By: Pinkie Pebbles M.D.   On: 09/17/2023 21:40   CT CHEST ABDOMEN PELVIS W CONTRAST Result Date: 09/17/2023 CLINICAL DATA:  Left frontal lobe mass, possible metastatic lesion, assessment for other metastatic disease or primary. * Tracking Code: BO * EXAM: CT CHEST, ABDOMEN, AND PELVIS WITH CONTRAST TECHNIQUE: Multidetector CT imaging of the chest, abdomen and pelvis was performed following the standard protocol during bolus administration of intravenous contrast. RADIATION DOSE REDUCTION: This exam was performed according to the departmental dose-optimization program which includes automated exposure control, adjustment of the mA and/or kV according to patient size and/or use of  iterative reconstruction technique. CONTRAST:  60mL OMNIPAQUE  IOHEXOL  350 MG/ML SOLN COMPARISON:  05/25/2023 FINDINGS: CT CHEST FINDINGS Cardiovascular: Mild atheromatous vascular calcification of the thoracic aorta. Mediastinum/Nodes: Pathologic right supraclavicular, right paratracheal, bilateral hilar, subcarinal, and bilateral infrahilar adenopathy. Index right paratracheal node 1.4 cm in short axis on image 17 series 3. Index left hilar node 1.3 cm in short axis on image 26 series 3. Lungs/Pleura: Substantial biapical pleuroparenchymal scarring. This has an asymmetric nodular component in the right upper lobe measuring 2.9 by 1.5 by 1.8 cm (volume = 4.1 cm^3) which could be from scarring or tumor. Extensive emphysema.  Honeycombing at the lung bases. Musculoskeletal: Unremarkable CT ABDOMEN PELVIS FINDINGS Hepatobiliary: Unremarkable Pancreas: Unremarkable Spleen: Unremarkable Adrenals/Urinary Tract: There is dense contrast in the renal collecting systems, ureters, and urinary bladder due to recent injection for the CT angiogram. This lowers sensitivity for nonobstructive calculi. No renal or adrenal mass. No obvious abnormal filling defect along the urothelium. Stomach/Bowel: Mild prominence of stool in the rectal vault. Normal appendix. Otherwise unremarkable. Vascular/Lymphatic: Atherosclerosis is present, including aortoiliac atherosclerotic disease. Reproductive: Posterior positioning of the uterus with suspected uterine fibroids and associated calcifications. Otherwise unremarkable. Other: No supplemental non-categorized findings.  Musculoskeletal: Unremarkable IMPRESSION: 1. Pathologic right supraclavicular, right paratracheal, bilateral hilar, subcarinal, and bilateral infrahilar adenopathy. 2. Substantial biapical pleuroparenchymal scarring. This has an asymmetric nodular component in the right upper lobe measuring 2.9 by 1.5 by 1.8 cm (volume = 4.1 cm^ 3) which could be from scarring or tumor. PET-CT  could help differentiate. 3. Extensive emphysema. 4. Honeycombing at the lung bases, compatible with interstitial lung disease. 5. Mild prominence of stool in the rectal vault. 6. Posterior positioning of the uterus with suspected uterine fibroids and associated calcifications. 7. Aortic atherosclerosis. Aortic Atherosclerosis (ICD10-I70.0) and Emphysema (ICD10-J43.9). Electronically Signed   By: Ryan Salvage M.D.   On: 09/17/2023 19:23   CT ANGIO HEAD NECK W WO CM (CODE STROKE) Addendum Date: 09/17/2023 ADDENDUM REPORT: 09/17/2023 19:02 ADDENDUM: Delayed images demonstrate peripheral enhancement the mass measuring 2.6 x 2.3 x 2.1 cm. Other foci of enhancement are present. This most likely represents a solitary metastasis. Electronically Signed   By: Lonni Necessary M.D.   On: 09/17/2023 19:02   Result Date: 09/17/2023 CLINICAL DATA:  Code stroke.  Left frontal lobe mass. EXAM: CT ANGIOGRAPHY HEAD AND NECK WITH AND WITHOUT CONTRAST TECHNIQUE: Multidetector CT imaging of the head and neck was performed using the standard protocol during bolus administration of intravenous contrast. Multiplanar CT image reconstructions and MIPs were obtained to evaluate the vascular anatomy. Carotid stenosis measurements (when applicable) are obtained utilizing NASCET criteria, using the distal internal carotid diameter as the denominator. RADIATION DOSE REDUCTION: This exam was performed according to the departmental dose-optimization program which includes automated exposure control, adjustment of the mA and/or kV according to patient size and/or use of iterative reconstruction technique. CONTRAST:  75mL OMNIPAQUE  IOHEXOL  350 MG/ML SOLN COMPARISON:  CT head without contrast 09/17/2023 FINDINGS: CTA NECK FINDINGS Aortic arch: A 3 vessel arch configuration is present. The aortic arch is incompletely imaged on this study. No significant focal stenosis is present the great vessel origins. Right carotid system:  Atherosclerotic calcifications are present at the right carotid bifurcation predominantly involving the external carotid artery. The right common carotid artery is normal. The cervical right ICA is otherwise normal. Left carotid system: The left common carotid artery is within normal limits. Mild noncalcified plaque is present posteriorly at the bifurcation without significant stenosis. The cervical left ICA is otherwise normal. Vertebral arteries: The left vertebral artery is dominant vessel. Both vertebral arteries originate from the subclavian arteries without significant stenosis. No significant stenosis is present in either vertebral artery in the neck. Skeleton: Vertebral body heights and alignment are normal. No focal osseous lesions are present. The patient is edentulous. Other neck: Soft tissues the neck are otherwise unremarkable. Salivary glands are within normal limits. Thyroid  is normal. No significant adenopathy is present. No focal mucosal or submucosal lesions are present. Upper chest: Central and paraseptal emphysematous changes are present. Peripheral lung nodules are present in the right upper lobe. Please see dedicated CT of the chest for further description. No pneumothorax is present. Paratracheal adenopathy is present. Review of the MIP images confirms the above findings CTA HEAD FINDINGS Anterior circulation: Internal carotid arteries are within normal limits the skull base to the ICA termini. The A1 and M1 segments are normal. Anterior communicating artery is patent. Mass effect deviates the left MCA and bilateral ACA branch vessels. No significant proximal stenosis or occlusion is present. Posterior circulation: The PICA origins are visualized and normal. Scratched at the left PICA origin is visualized and normal. The right PICA scratched at the  hypoplastic right vertebral artery terminates at the PICA. The basilar artery is normal. The superior cerebellar arteries are patent. Posterior  cerebral arteries originate from basilar tip. A right posterior communicating artery contributes. The PCA branch vessels are normal bilaterally. No aneurysm is present. Venous sinuses: The dural sinuses are patent. The straight sinus and deep cerebral veins are intact. Cortical veins are within normal limits. No significant vascular malformation is evident. Anatomic variants: None Review of the MIP images confirms the above findings IMPRESSION: 1. No emergent large vessel occlusion. 2. Mass effect deviates the left MCA and bilateral ACA branch vessels without significant proximal stenosis or occlusion. 3. Mild atherosclerotic changes at the carotid bifurcations bilaterally without significant stenosis. 4. Peripheral lung nodules in the right upper lobe. Please see dedicated CT of the chest for further description. 5. Paratracheal adenopathy is related to metastatic disease. Please see dedicated CT of the chest for further description. 6.  Emphysema (ICD10-J43.9). Electronically Signed: By: Lonni Necessary M.D. On: 09/17/2023 18:53   CT HEAD CODE STROKE WO CONTRAST Result Date: 09/17/2023 CLINICAL DATA:  Code stroke. Neuro deficit, acute, stroke suspected. EXAM: CT HEAD WITHOUT CONTRAST TECHNIQUE: Contiguous axial images were obtained from the base of the skull through the vertex without intravenous contrast. RADIATION DOSE REDUCTION: This exam was performed according to the departmental dose-optimization program which includes automated exposure control, adjustment of the mA and/or kV according to patient size and/or use of iterative reconstruction technique. COMPARISON:  None Available. FINDINGS: Brain: A left frontal lobe mass measures 20 x 20 x 18 mm. Extensive surrounding vasogenic edema present throughout the left frontal lobe. Mass effect is present with effacement of the sulci and 8 mm of left to right midline shift. Asymmetric effacement of the left lateral ventricle is noted. No other discrete  lesions are present. No acute cortical infarcts are present. The brainstem and cerebellum are within normal limits. Midline structures are within normal limits. Vascular: No hyperdense vessel or unexpected calcification. Skull: Calvarium is intact. No focal lytic or blastic lesions are present. No significant extracranial soft tissue lesion is present. Sinuses/Orbits: The paranasal sinuses and mastoid air cells are clear. The globes and orbits are within normal limits. IMPRESSION: 1. 20 x 20 x 18 mm left frontal lobe mass with extensive surrounding vasogenic edema. This is concerning for a primary or metastatic neoplasm. Recommend MRI the brain without and with contrast for further evaluation. 2. Mass effect with effacement of the sulci and 8 mm of left to right midline shift. 3. No other discrete lesions are present. 4. No other acute intracranial abnormality. The above was relayed via text pager to Dr. ELIGIO LAV on 09/17/2023 at 18:25 . Electronically Signed   By: Lonni Necessary M.D.   On: 09/17/2023 18:25   DG Chest Portable 1 View Result Date: 09/17/2023 CLINICAL DATA:  Tachycardia. EXAM: PORTABLE CHEST 1 VIEW COMPARISON:  Radiograph 05/25/2023 FINDINGS: The heart is normal in size. Stable mediastinal contours. Chronic hyperinflation and emphysema. There is biapical pleuroparenchymal scarring, similar. Reticular opacities at both lung bases are without significant interval change, typical of honeycombing. No definite acute airspace disease. No features of pulmonary edema. No pneumothorax or significant pleural effusion. IMPRESSION: 1. Chronic hyperinflation and emphysema. 2. Bibasilar reticular opacities, typical of honeycombing, without significant interval change. 3. Biapical pleuroparenchymal scarring. Electronically Signed   By: Andrea Gasman M.D.   On: 09/17/2023 18:02     Subjective: No acute issues or events overnight, patient requesting discharge home to await results  which is certainly  reasonable.  Otherwise denies nausea vomiting diarrhea constipation any fevers chills or chest pain   Discharge Exam: Vitals:   09/20/23 0816 09/20/23 1232  BP: (!) 145/84 (!) 147/98  Pulse:    Resp:    Temp: 97.9 F (36.6 C) 98.1 F (36.7 C)  SpO2:     Vitals:   09/19/23 2306 09/20/23 0326 09/20/23 0816 09/20/23 1232  BP: (!) 149/90 (!) 157/92 (!) 145/84 (!) 147/98  Pulse: 70 78    Resp: 20 18    Temp: (!) 97.3 F (36.3 C) 97.8 F (36.6 C) 97.9 F (36.6 C) 98.1 F (36.7 C)  TempSrc: Oral Oral Oral Oral  SpO2:  97%    Weight:      Height:        General: Pt is alert, awake, not in acute distress Cardiovascular: RRR, S1/S2 +, no rubs, no gallops Respiratory: CTA bilaterally, no wheezing, no rhonchi Abdominal: Soft, NT, ND, bowel sounds + Extremities: Right arm in sling    The results of significant diagnostics from this hospitalization (including imaging, microbiology, ancillary and laboratory) are listed below for reference.     Microbiology: No results found for this or any previous visit (from the past 240 hours).   Labs: BNP (last 3 results) No results for input(s): BNP in the last 8760 hours. Basic Metabolic Panel: Recent Labs  Lab 09/17/23 1715 09/17/23 1810 09/18/23 0620 09/19/23 0405 09/20/23 0512  NA 134* 135 135 131* 133*  K 3.6 3.1* 4.3 3.8 4.3  CL 102 98 103 101 102  CO2 18*  --  19* 18* 21*  GLUCOSE 80 80 116* 149* 122*  BUN 10 10 8 12 15   CREATININE 0.73 0.70 0.71 0.55 0.65  CALCIUM  10.3  --  9.2 9.0 9.7  MG 1.7  --   --   --   --    Liver Function Tests: Recent Labs  Lab 09/18/23 0620  AST 23  ALT 13  ALKPHOS 98  BILITOT 0.8  PROT 7.2  ALBUMIN 3.6   CBC: Recent Labs  Lab 09/17/23 1715 09/17/23 1810 09/18/23 0620 09/19/23 0405 09/20/23 0512  WBC 9.2  --  4.9 12.9* 17.3*  HGB 14.9 16.3* 14.6 13.9 15.3*  HCT 43.9 48.0* 43.0 40.0 44.3  MCV 87.8  --  87.6 86.4 86.0  PLT 372  --  360 329 328   CBG: Recent Labs  Lab  09/17/23 1802  GLUCAP 87   Urinalysis    Component Value Date/Time   COLORURINE STRAW (A) 09/17/2023 2019   APPEARANCEUR CLEAR 09/17/2023 2019   LABSPEC 1.027 09/17/2023 2019   PHURINE 5.0 09/17/2023 2019   GLUCOSEU NEGATIVE 09/17/2023 2019   HGBUR SMALL (A) 09/17/2023 2019   BILIRUBINUR NEGATIVE 09/17/2023 2019   KETONESUR 5 (A) 09/17/2023 2019   PROTEINUR NEGATIVE 09/17/2023 2019   NITRITE NEGATIVE 09/17/2023 2019   LEUKOCYTESUR NEGATIVE 09/17/2023 2019   Sepsis Labs Recent Labs  Lab 09/17/23 1715 09/18/23 0620 09/19/23 0405 09/20/23 0512  WBC 9.2 4.9 12.9* 17.3*   Microbiology No results found for this or any previous visit (from the past 240 hours).   Time coordinating discharge: Over 30 minutes  SIGNED:   Elsie JAYSON [place], DO [facility] 09/20/2023, 12:52 PM Pager   If 7PM-7AM, please contact night-coverage [email or web address]

## 2023-09-20 NOTE — Progress Notes (Signed)
 Physical Therapy Treatment Patient Details Name: Regina [name] MRN: [id number] DOB: July 03, 1966 Today's Date: 09/20/2023   History of Present Illness Pt is a 58 y/o female presenting on 1/11 with aphasia, R sided weakness, and R arm pain. Found with R humerus mid shaft pathologic fx, abnormal adenopathy in thoracic cavity. 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 PMH includes: anxiety, COPD, emphysema of lung, HTN.    PT Comments  Pt up in bathroom upon PT arrival to room, RUE sling doffed and pt states it doesn't help. Pt mobilizing at modified independent level at this time, gait is [facility] and pt with good activity tolerance. Pt's main difficulty during PT session is aphasic changes, pt easily frustrated by this. Pt also irritated that she may d/c home today, informed MD of this. Pt to d/c home, no acute PT follow up needed at this time.     If plan is discharge home, recommend the following: Supervision due to cognitive status;Assist for transportation;Direct supervision/assist for medications management   Can travel by private vehicle        Equipment Recommendations  None recommended by PT    Recommendations for Other Services       Precautions / Restrictions Precautions Precautions: Other (comment) Precaution Comments: aphasia, decreased cog Required Braces or Orthoses: Sling Restrictions Weight Bearing Restrictions Per Provider Order: No Other Position/Activity Restrictions: PT placed RUE sling, upon PT arrival to room sling was off and pt states it doesn't help     Mobility  Bed Mobility Overal bed mobility: Modified Independent             General bed mobility comments: OOB in bathroom    Transfers Overall transfer level: Needs assistance Equipment used: None Transfers: Sit to/from Stand Sit to Stand: Modified independent (Device/Increase time)           General transfer  comment: increased time, no assist    Ambulation/Gait Ambulation/Gait assistance: Modified independent (Device/Increase time) Gait Distance (Feet): 350 Feet Assistive device: None Gait Pattern/deviations: WFL(Within Functional Limits) Gait velocity: WFL     General Gait Details: no AD, tolerates challenge well (horizontal head turns, directional changes)   Stairs             Wheelchair Mobility     Tilt Bed    Modified Rankin (Stroke Patients Only)       Balance Overall balance assessment: No apparent balance deficits (not formally assessed)                                          Cognition Arousal: Alert Behavior During Therapy: WFL for tasks assessed/performed Overall Cognitive Status: Difficult to assess                                 General Comments: difficulty with word finding, gets frustrated very quickly        Exercises      General Comments        Pertinent Vitals/Pain Pain Assessment Pain Assessment: Faces Faces Pain Scale: Hurts little more Pain Location: RUE Pain Descriptors / Indicators: Discomfort, Grimacing Pain Intervention(s): Limited activity within patient's tolerance, Monitored during session, Repositioned    Home Living  Prior Function            PT Goals (current goals can now be found in the care plan section) Acute Rehab PT Goals Patient Stated Goal: not stated PT Goal Formulation: With patient/family Time For Goal Achievement: 10/02/23 Potential to Achieve Goals: Good Progress towards PT goals: Progressing toward goals    Frequency    Min 1X/week      PT Plan      Co-evaluation              AM-PAC PT 6 Clicks Mobility   Outcome Measure  Help needed turning from your back to your side while in a flat bed without using bedrails?: None Help needed moving from lying on your back to sitting on the side of a flat bed without using  bedrails?: None Help needed moving to and from a bed to a chair (including a wheelchair)?: None Help needed standing up from a chair using your arms (e.g., wheelchair or bedside chair)?: None Help needed to walk in hospital room?: A Little Help needed climbing 3-5 steps with a railing? : A Little 6 Click Score: 22    End of Session Equipment Utilized During Treatment: Other (comment) (RUE sling) Activity Tolerance: Patient tolerated treatment well Patient left: in bed;with call bell/phone within reach;with family/visitor present Nurse Communication: Mobility status PT Visit Diagnosis: Difficulty in walking, not elsewhere classified (R26.2)     Time: 8754-8743 PT Time Calculation (min) (ACUTE ONLY): 11 min  Charges:    $Therapeutic Activity: 8-22 mins PT General Charges $$ ACUTE PT VISIT: 1 Visit                     Johana RAMAN, PT DPT Acute Rehabilitation Services Secure Chat Preferred  Office [phone]    Toniann Dickerson [place] 09/20/2023, 2:32 PM

## 2023-09-20 NOTE — TOC Transition Note (Signed)
 Transition of Care (TOC) - Discharge Note Rayfield Gobble RN,BSN Transitions of Care Unit 4NP (Non Trauma)- RN Case Manager See Treatment Team for direct Phone #   Patient Details  Name: Regina [name] MRN: [id number] Date of Birth: 1966/02/27  Transition of Care [facility]) CM/SW Contact:  Gobble Rayfield Hurst, RN Phone Number: 09/20/2023, 2:40 PM   Clinical Narrative:    Pt stable for transition home pending bx results and outpt follow up. J CM reached out to niece to discuss transition needs per therapy. No recommendations made for HH, OT recs for outpt. Niece voiced they were wondering about aide assist or someone to stay with pt- explained that insurance does not cover this type of assistance and pt does not need skilled level for [facility] therapy. Pt can f/u for outpt OT after bx results and plan made for treatment. Niece voiced understanding and asked to have time to discuss plan with other family members. Will plan to order [facility] per recommendations and niece request. Niece to call CM back after speaking w/ family  Call made to [employer] for DME referral- [facility] to deliver to room prior to discharge.   1430- VM received back from niece and return call made- per niece family agreeable with discharge and will plan to assist pt as needed at home until bx return and plan made for treatment needs. Niece to come transport home. No further TOC needs noted.    Final next level of care: Home/Self Care Barriers to Discharge: No Barriers Identified   Patient Goals and CMS Choice Patient states their goals for this hospitalization and ongoing recovery are:: return home   Choice offered to / list presented to : NA      Discharge Placement               Home        Discharge Plan and Services Additional resources added to the After Visit Summary for     Discharge Planning Services: CM Consult Post Acute Care Choice: Durable Medical Equipment          DME Arranged: Bedside commode DME Agency:  [employer] Date DME Agency Contacted: 09/20/23 Time DME Agency Contacted: 1255 Representative spoke with at DME Agency: Zack HH Arranged: NA HH Agency: NA        Social Drivers of Health (SDOH) Interventions SDOH Screenings   Food Insecurity: Patient Declined (09/18/2023)  Housing: Patient Declined (09/18/2023)  Transportation Needs: Patient Declined (09/18/2023)  Utilities: Patient Declined (09/18/2023)  Depression (PHQ2-9): Low Risk  (06/08/2023)  Tobacco Use: Medium Risk (09/17/2023)     Readmission Risk Interventions    09/20/2023    2:40 PM  Readmission Risk Prevention Plan  Medication Screening Complete  Transportation Screening Complete

## 2023-09-21 ENCOUNTER — Other Ambulatory Visit: Payer: Self-pay

## 2023-09-21 ENCOUNTER — Telehealth: Payer: Self-pay | Admitting: *Deleted

## 2023-09-21 DIAGNOSIS — R918 Other nonspecific abnormal finding of lung field: Secondary | ICD-10-CM

## 2023-09-21 NOTE — Transitions of Care (Post Inpatient/ED Visit) (Signed)
 09/21/2023  Name: Regina [name] MRN: [id number] DOB: [birth year]  Today's TOC FU Call Status: Today's TOC FU Call Status:: Successful TOC FU Call Completed TOC FU Call Complete Date: 09/21/23 Patient's Name and Date of Birth confirmed.  Transition Care Management Follow-up Telephone Call Date of Discharge: 09/20/23 Discharge Facility: [facility]) Type of Discharge: Inpatient Admission Primary Inpatient Discharge Diagnosis:: Brain Mass How have you been since you were released from the hospital?: Better Any questions or concerns?: No  Items Reviewed: Did you receive and understand the discharge instructions provided?: Yes Medications obtained,verified, and reconciled?: Yes (Medications Reviewed) Any new allergies since your discharge?: No Dietary orders reviewed?: No Do you have support at home?: Yes People in Home: alone Name of Support/Comfort Primary Source: Racia  Medications Reviewed Today: Medications Reviewed Today     Reviewed by Eilene Grater, RN (Case Manager) on 09/21/23 at 1042  Med List Status: <None>   Medication Order Taking? Sig Documenting Provider Last Dose Status Informant  acetaminophen  (TYLENOL ) 325 MG tablet [id number] Yes Take 2 tablets (650 mg total) by mouth every 6 (six) hours as needed for mild pain (pain score 1-3) (or Fever >/= 101). Haydee Lipa, MD Taking Active   dexamethasone  (DECADRON ) 2 MG tablet [id number] Yes Take 2 tablets (4 mg total) by mouth 2 (two) times daily with a meal for 4 days, THEN 1 tablet (2 mg total) 2 (two) times daily with a meal for 7 days. Haydee Lipa, MD Taking Active   levETIRAcetam  (KEPPRA ) 500 MG tablet [id number] Yes Take 1 tablet (500 mg total) by mouth 2 (two) times daily. Haydee Lipa, MD Taking Active   oxyCODONE  (ROXICODONE ) 5 MG immediate release tablet [id number] Yes Take 1 tablet (5 mg total) by mouth every 4 (four) hours as needed for severe pain (pain score 7-10). Haydee Lipa, MD Taking Active             Home Care and Equipment/Supplies: Were Home Health Services Ordered?: NA Any new equipment or medical supplies ordered?: Yes Name of Medical supply agency?: [employer] Were you able to get the equipment/medical supplies?: Yes Do you have any questions related to the use of the equipment/supplies?: No  Functional Questionnaire: Do you need assistance with bathing/showering or dressing?: Yes Do you need assistance with meal preparation?: No Do you need assistance with eating?: No Do you have difficulty maintaining continence: No Do you need assistance with getting out of bed/getting out of a chair/moving?: No Do you have difficulty managing or taking your medications?: Yes  Follow up appointments reviewed: PCP Follow-up appointment confirmed?: Yes Date of PCP follow-up appointment?: 10/04/23 Follow-up Provider: Madelyn Schick NP Specialist Hospital Follow-up appointment confirmed?: Yes Date of Specialist follow-up appointment?: 09/26/23 Follow-Up Specialty Provider:: oncology Do you need transportation to your follow-up appointment?: No Do you understand care options if your condition(s) worsen?: Yes-patient verbalized understanding  SDOH Interventions Today    Flowsheet Row Most Recent Value  SDOH Interventions   Food Insecurity Interventions Intervention Not Indicated  Housing Interventions Intervention Not Indicated  Transportation Interventions Intervention Not Indicated, Patient Resources (Friends/Family)  Utilities Interventions Intervention Not Indicated      Interventions Today    Flowsheet Row Most Recent Value  Chronic Disease   Chronic disease during today's visit Other  [Brain Mass]  General Interventions   General Interventions Discussed/Reviewed General Interventions Discussed, General Interventions Reviewed, Doctor Visits, Referral to Nurse  Fleta Human to [facility] Coordination nurse for follow up]  Doctor  Visits Discussed/Reviewed Doctor Visits Discussed, Doctor Visits Reviewed  Pharmacy Interventions   Pharmacy Dicussed/Reviewed Pharmacy Topics Discussed, Pharmacy Topics Reviewed        Goals Addressed             This Visit's Progress    TOC 30 day program       Current Barriers:  Knowledge Deficits related to plan of care for management of Brain Mass   RNCM Clinical Goal(s):  Patient will work with the Care Management team over the next 30 days to address Transition of Care Barriers: Provider appointments take all medications exactly as prescribed and will call provider for medication related questions as evidenced by EMR attend all scheduled medical appointments: Specialist and PCP as evidenced by EMR  through collaboration with RN Care manager, provider, and care team.   Interventions: Evaluation of current treatment plan related to  self management and patient's adherence to plan as established by provider  Transitions of Care:  New goal. Doctor Visits  - discussed the importance of doctor visits Arranged PCP follow-up within 12-14 days (Care Guide Scheduled)  Patient Goals/Self-Care Activities: Participate in Transition of Care Program/Attend [facility] scheduled calls Notify RN Care Manager of TOC call rescheduling needs Take all medications as prescribed Attend all scheduled provider appointments Call pharmacy for medication refills 3-7 days in advance of running out of medications  Follow Up Plan:  Telephone follow up appointment with care management team member scheduled for:  Randye Buttner 09/29/2023 1:45 The patient has been provided with contact information for the care management team and has been advised to call with any health related questions or concerns.  Next PCP appointment scheduled for:  10/04/2023 Madelyn Schick       Patient has agreed to follow up outreach calls with Care Coordination   Una Ganser BSN RN Population Health- Transition of Care  Team.  [facility] [phone]

## 2023-09-21 NOTE — Progress Notes (Signed)
 I reached out to the pt today to see if she would be able to come to a consult appt tomorrow 1/16 at 1:45 for labs at 2;15 to see Dr. Marguerita Shih. I introduced myself and explained the role of a nurse navigator. Pt ask that I call her niece, [name], whose number is in the chart. I spoke to [name] to see if the pt can come to the appts. I explained that the pre appointment labs are requested prior to the MD appt so an up to date clinical picture of the pt can be obtained. [name] asked that if the labs that were done recently while the pt was hospitalized could be used. I told [name] that I will still book the pt for labs, but if Dr Marguerita Shih says the ones from her in-patient stay are fine, then I will cancel it. I let [name] know that the pt does NOT need to fast for the labs, should she need to get them done. [name] is aware of the location of the cancer clinic. I gave [name] my name and desk number if she needs to reach me prior to the appt. No questions at the conclusion of the conversation.

## 2023-09-22 ENCOUNTER — Inpatient Hospital Stay: Payer: 59 | Attending: Internal Medicine

## 2023-09-22 ENCOUNTER — Inpatient Hospital Stay (HOSPITAL_BASED_OUTPATIENT_CLINIC_OR_DEPARTMENT_OTHER): Payer: 59 | Admitting: Internal Medicine

## 2023-09-22 VITALS — BP 147/98 | HR 79 | Temp 97.7°F | Resp 16 | Wt 105.8 lb

## 2023-09-22 DIAGNOSIS — I10 Essential (primary) hypertension: Secondary | ICD-10-CM | POA: Diagnosis not present

## 2023-09-22 DIAGNOSIS — C3411 Malignant neoplasm of upper lobe, right bronchus or lung: Secondary | ICD-10-CM | POA: Insufficient documentation

## 2023-09-22 DIAGNOSIS — F32A Depression, unspecified: Secondary | ICD-10-CM | POA: Diagnosis not present

## 2023-09-22 DIAGNOSIS — C3490 Malignant neoplasm of unspecified part of unspecified bronchus or lung: Secondary | ICD-10-CM | POA: Diagnosis not present

## 2023-09-22 DIAGNOSIS — C7931 Secondary malignant neoplasm of brain: Secondary | ICD-10-CM | POA: Insufficient documentation

## 2023-09-22 DIAGNOSIS — C7951 Secondary malignant neoplasm of bone: Secondary | ICD-10-CM | POA: Insufficient documentation

## 2023-09-22 DIAGNOSIS — F419 Anxiety disorder, unspecified: Secondary | ICD-10-CM | POA: Diagnosis not present

## 2023-09-22 DIAGNOSIS — R918 Other nonspecific abnormal finding of lung field: Secondary | ICD-10-CM

## 2023-09-22 LAB — CBC WITH DIFFERENTIAL (CANCER CENTER ONLY)
Abs Immature Granulocytes: 0.06 10*3/uL (ref 0.00–0.07)
Basophils Absolute: 0 10*3/uL (ref 0.0–0.1)
Basophils Relative: 0 %
Eosinophils Absolute: 0.2 10*3/uL (ref 0.0–0.5)
Eosinophils Relative: 2 %
HCT: 41.5 % (ref 36.0–46.0)
Hemoglobin: 14 g/dL (ref 12.0–15.0)
Immature Granulocytes: 1 %
Lymphocytes Relative: 32 %
Lymphs Abs: 3.9 10*3/uL (ref 0.7–4.0)
MCH: 29.2 pg (ref 26.0–34.0)
MCHC: 33.7 g/dL (ref 30.0–36.0)
MCV: 86.6 fL (ref 80.0–100.0)
Monocytes Absolute: 1.1 10*3/uL — ABNORMAL HIGH (ref 0.1–1.0)
Monocytes Relative: 9 %
Neutro Abs: 7 10*3/uL (ref 1.7–7.7)
Neutrophils Relative %: 56 %
Platelet Count: 403 10*3/uL — ABNORMAL HIGH (ref 150–400)
RBC: 4.79 MIL/uL (ref 3.87–5.11)
RDW: 13.9 % (ref 11.5–15.5)
WBC Count: 12.3 10*3/uL — ABNORMAL HIGH (ref 4.0–10.5)
nRBC: 0 % (ref 0.0–0.2)

## 2023-09-22 LAB — CMP (CANCER CENTER ONLY)
ALT: 13 U/L (ref 0–44)
AST: 17 U/L (ref 15–41)
Albumin: 4 g/dL (ref 3.5–5.0)
Alkaline Phosphatase: 85 U/L (ref 38–126)
Anion gap: 4 — ABNORMAL LOW (ref 5–15)
BUN: 11 mg/dL (ref 6–20)
CO2: 32 mmol/L (ref 22–32)
Calcium: 9.7 mg/dL (ref 8.9–10.3)
Chloride: 100 mmol/L (ref 98–111)
Creatinine: 0.7 mg/dL (ref 0.44–1.00)
GFR, Estimated: >60 mL/min (ref >60)
Glucose, Bld: 93 mg/dL (ref 70–99)
Potassium: 3.3 mmol/L — ABNORMAL LOW (ref 3.5–5.1)
Sodium: 136 mmol/L (ref 135–145)
Total Bilirubin: 0.4 mg/dL (ref 0.0–1.2)
Total Protein: 7 g/dL (ref 6.5–8.1)

## 2023-09-22 LAB — SURGICAL PATHOLOGY

## 2023-09-22 NOTE — Progress Notes (Signed)
[facility] [phone]   [phone]  CONSULT NOTE  REFERRING PHYSICIAN: Gwinda Passe, NP  REASON FOR CONSULTATION:  58 years old African-American female recently diagnosed with lung cancer  HPI Regina [name] is a 58 y.o. female with past medical history significant for hypertension, COPD, and anxiety and long history of smoking but quit in February 2021.  The patient presented to the emergency department at [facility] on September 17, 2023 with aphasia and right-sided weakness as well as pain on the right arm.  During her evaluation she had CT scan of the head with stroke protocol that showed 2.0 x 2.0 x 1.8 cm left frontal lobe mass with extensive surrounding vasogenic edema concerning for primary or metastatic neoplasm.  There was mass affect with effacement of the sulci and 8 mm of left-to-right midline shift.  She had CT scan of the chest, abdomen and pelvis on September 17, 2023 and it showed a symmetric nodular component in the right upper lobe measuring 2.9 x 1.5 x 1.8 cm.  There was pathologic right supraclavicular, right paratracheal, lateral hilar, subcarinal and bilateral infrahilar adenopathy with index right paratracheal node measuring 1.4 cm and index left hilar node measuring 1.3 cm.  The patient also had x-ray of the right humerus that showed 4.5 cm lytic metastasis with pathologic fracture involving the right mid humeral shaft.  MRI of the brain with and without contrast showed 2.8 x 2.6 x 2.0 cm enhancing mass centered at the subcortical left frontal lobe with extensive surrounding vasogenic edema and 9 mm  left-to-right shift.  On September 19, 2023 the patient underwent ultrasound-guided core biopsy of the right upper extremity soft tissue mass by interventional radiology and the final pathology ([id number]) showed metastatic adenocarcinoma consistent with lung primary. The carcinoma is poorly differentiated.  Immunohistochemistry  shows positivity with TTF-1, Napsin A, MOC-31 and cytokeratin 7.  The carcinoma is negative with cytokeratin 5/6, p40, CDX2 and cytokeratin 20.  The morphology and immunohistochemistry are consistent with poorly differentiated lung adenocarcinoma.  The patient was referred to me today for evaluation and recommendation regarding her condition. Discussed the use of AI scribe software for clinical note transcription with the patient, who gave verbal consent to proceed.  History of Present Illness   Regina [name], a 58 year old individual with a history of COPD, anxiety, depression, and hypertension, was recently diagnosed with lung cancer. The patient initially presented to the hospital with speech difficulties and right arm pain. A subsequent brain scan revealed a 2.0 x 2.0 x 1.8 cm left frontal lobe mass with extensive surrounding vasogenic edema concerning for primary or metastatic neoplasm, causing midline shift. A biopsy of the right upper extremity bone lytic lesion confirmed the diagnosis of adenocarcinoma, indicating metastatic lung cancer.  The patient reports feeling generally well, with no chest pain, shortness of breath, or cough. However, she experiences persistent pain in the right arm, rating it as a 5 on a scale of 1 to 10. The pain is somewhat relieved with oxycodone. The patient denies any nausea, vomiting, diarrhea, headaches, or changes in vision.  The patient has been started on dexamethasone for brain swelling and Keppra for seizure prevention. She also takes oxycodone for pain management. The patient has a significant smoking history, having started in high school and continuing to the present, amounting to over 40 years of tobacco use. She also reports consuming four cans of beer daily.  The patient's family history is notable for lung cancer in  a sibling and throat cancer in an uncle. The patient lives with a brother, whom she cares for, as he is disabled. She is currently employed  at [employer].      HPI  Past Medical History:  Diagnosis Date   Allergy    Anxiety    COPD (chronic obstructive pulmonary disease) (HCC)    Emphysema of lung (HCC)    Hypertension     Past Surgical History:  Procedure Laterality Date   dental procedure      Family History  Problem Relation Age of Onset   Leukemia Mother    Hypertension Mother    Hypertension Father    Cancer Neg Hx    Diabetes Neg Hx    Heart disease Neg Hx    Colon cancer Neg Hx    Esophageal cancer Neg Hx    Rectal cancer Neg Hx    Stomach cancer Neg Hx     Social History Social History   Tobacco Use   Smoking status: Former    Current packs/day: 0.00    Types: Cigarettes    Quit date: 10/31/2019    Years since quitting: 3.8   Smokeless tobacco: Never  Vaping Use   Vaping status: Never Used  Substance Use Topics   Alcohol use: Yes    Alcohol/week: 6.0 standard drinks of alcohol    Types: 6 Cans of beer per week   Drug use: Yes    Frequency: 4.0 times per week    Types: Marijuana    Allergies  Allergen Reactions   Pork Allergy Nausea And Vomiting   Bee Venom Hives    Current Outpatient Medications  Medication Sig Dispense Refill   acetaminophen (TYLENOL) 325 MG tablet Take 2 tablets (650 mg total) by mouth every 6 (six) hours as needed for mild pain (pain score 1-3) (or Fever >/= 101). 30 tablet 0   dexamethasone (DECADRON) 2 MG tablet Take 2 tablets (4 mg total) by mouth 2 (two) times daily with a meal for 4 days, THEN 1 tablet (2 mg total) 2 (two) times daily with a meal for 7 days. 30 tablet 0   levETIRAcetam (KEPPRA) 500 MG tablet Take 1 tablet (500 mg total) by mouth 2 (two) times daily. 60 tablet 0   oxyCODONE (ROXICODONE) 5 MG immediate release tablet Take 1 tablet (5 mg total) by mouth every 4 (four) hours as needed for severe pain (pain score 7-10). 30 tablet 0   No current facility-administered medications for this visit.    Review of Systems  Constitutional: positive for  fatigue and weight loss Eyes: negative Ears, nose, mouth, throat, and face: negative Respiratory: positive for cough Cardiovascular: negative Gastrointestinal: negative Genitourinary:negative Integument/breast: negative Hematologic/lymphatic: negative Musculoskeletal:positive for bone pain Neurological: positive for speech problems and weakness Behavioral/Psych: negative Endocrine: negative Allergic/Immunologic: negative  Physical Exam  ZOX:WRUEA, healthy, no distress, well developed, and malnourished SKIN: skin color, texture, turgor are normal HEAD: Normocephalic EYES: normal, PERRLA, Conjunctiva are pink and non-injected EARS: External ears normal, Canals clear OROPHARYNX:no exudate, no erythema, and lips, buccal mucosa, and tongue normal  NECK: supple, no adenopathy, no JVD LYMPH:  no palpable lymphadenopathy, no hepatosplenomegaly BREAST:not examined LUNGS: clear to auscultation , and palpation HEART: regular rate & rhythm, no murmurs, and no gallops ABDOMEN:abdomen soft, non-tender, normal bowel sounds, and no masses or organomegaly BACK: Back symmetric, no curvature., No CVA tenderness EXTREMITIES:no joint deformities, effusion, or inflammation, no edema  NEURO: alert & oriented x 3 with fluent speech,  no focal motor/sensory deficits  PERFORMANCE STATUS: ECOG 1  LABORATORY DATA: Lab Results  Component Value Date   WBC 17.3 (H) 09/20/2023   HGB 15.3 (H) 09/20/2023   HCT 44.3 09/20/2023   MCV 86.0 09/20/2023   PLT 328 09/20/2023      Chemistry      Component Value Date/Time   NA 133 (L) 09/20/2023 0512   NA 130 (L) 10/19/2017 0942   K 4.3 09/20/2023 0512   CL 102 09/20/2023 0512   CO2 21 (L) 09/20/2023 0512   BUN 15 09/20/2023 0512   BUN 4 (L) 10/19/2017 0942   CREATININE 0.65 09/20/2023 0512      Component Value Date/Time   CALCIUM 9.7 09/20/2023 0512   ALKPHOS 98 09/18/2023 0620   AST 23 09/18/2023 0620   ALT 13 09/18/2023 0620   BILITOT 0.8  09/18/2023 0620   BILITOT <0.2 10/19/2017 0942       RADIOGRAPHIC STUDIES: [place] CORE BIOPSY (SOFT TISSUE) Result Date: 09/20/2023 INDICATION: [id number] Soft tissue mass [id number]. History of lung mass with RIGHT upper extremity pathologic fracture EXAM: ULTRASOUND-GUIDED RIGHT UPPER EXTREMITY SOFT TISSUE MASS BIOPSY COMPARISON:  RIGHT upper extremity XRs and CT CAP, 09/17/2023. MEDICATIONS: None ANESTHESIA/SEDATION: Moderate (conscious) sedation was employed during this procedure. A total of Versed 2 mg and Fentanyl 100 mcg was administered intravenously. Moderate Sedation Time: 14 minutes. The patient's level of consciousness and vital signs were monitored continuously by radiology nursing throughout the procedure under my direct supervision. COMPLICATIONS: None immediate. TECHNIQUE: Informed written consent was obtained from the patient and/or patient's representative after a discussion of the risks, benefits and alternatives to treatment. Questions regarding the procedure were encouraged and answered. Initial ultrasound scanning demonstrated hypervascular soft tissue mass about upper extremity pathologic fracture. An ultrasound image was saved for documentation purposes. The procedure was planned. A timeout was performed prior to the initiation of the procedure. The operative was prepped and draped in the usual sterile fashion, and a sterile drape was applied covering the operative field. A timeout was performed prior to the initiation of the procedure. Local anesthesia was provided with 1% lidocaine with epinephrine. Under direct ultrasound guidance, an 18 gauge core needle device was utilized to obtain to obtain 4 core needle biopsies of the RIGHT upper extremity mass. The samples were placed in saline and submitted to pathology. The needle was removed and superficial hemostasis was achieved with manual compression. Post procedure scan was negative for significant hematoma. A dressing was applied. The patient  tolerated the procedure well without immediate postprocedural complication. IMPRESSION: Successful ultrasound guided biopsy of a RIGHT extremity soft tissue mass, about the pathologic fracture. Regina Banning, MD [facility] Electronically Signed   By: Regina [name] M.D.   On: 09/20/2023 20:10   EEG adult Result Date: 09/18/2023 Regina Quest, MD     09/18/2023  8:44 AM Patient Name: Regina [name] MRN: [id number] Epilepsy Attending: Charlsie [name] Referring Physician/Provider: Milon Dikes, MD Date: 09/17/2023 Duration: 22.31 mins Patient history: 58 y.o. female with medical history significant for COPD, depression, anxiety, and hypertension, presenting to the emergency department with aphasia, right sided weakness, and right arm pain. EEG to evaluate for seizure Level of alertness: Awake AEDs during EEG study: LEV Technical aspects: This EEG study was done with scalp electrodes positioned according to the 10-20 International system of electrode placement. Electrical activity was reviewed with band pass filter of 1-70Hz , sensitivity of 7 uV/mm, display speed of 46mm/sec  with a 60Hz  notched filter applied as appropriate. EEG data were recorded continuously and digitally stored.  Video monitoring was available and reviewed as appropriate. Description: The posterior dominant rhythm consists of 8 Hz activity of moderate voltage (25-35 uV) seen predominantly in posterior head regions, symmetric and reactive to eye opening and eye closing. EEG showed continuous polymorphic 3 to 6 Hz theta-delta slowing in left fronto-temporal region. Hyperventilation and photic stimulation were not performed.   ABNORMALITY - Continuous slow, left fronto-temporal IMPRESSION: This study is suggestive of cortical dysfunction arising from left fronto-temporal region likely secondary to underlying structural abnormality. No seizures or epileptiform discharges were seen throughout  the recording. Regina [name]   MR BRAIN W WO CONTRAST Result Date: 09/18/2023 CLINICAL DATA:  Initial evaluation for intracranial mass. EXAM: MRI HEAD WITHOUT AND WITH CONTRAST TECHNIQUE: Multiplanar, multiecho pulse sequences of the brain and surrounding structures were obtained without and with intravenous contrast. CONTRAST:  5mL GADAVIST GADOBUTROL 1 MMOL/ML IV SOLN COMPARISON:  Prior CTs from earlier the same day. FINDINGS: Brain: Cerebral volume within normal limits. Patchy T2/FLAIR hyperintensity involving the periventricular deep white matter both cerebral hemispheres, consistent with chronic small vessel ischemic disease, moderately advanced in nature. No evidence for acute or subacute ischemia. No areas of chronic cortical infarction. No acute or chronic intracranial blood products. Round mass measuring 2.8 x 2.6 x 2.0 cm seen centered at the subcortical left frontal lobe (series 17, image 36). Lesion demonstrates avid peripheral postcontrast enhancement. Extensive T2/FLAIR signal intensity throughout the adjacent left frontal lobe, consistent with vasogenic edema and/or possible infiltrating nonenhancing tumor. Partial effacement of the left lateral ventricle with 9 mm of left-to-right shift. No hydrocephalus or trapping. Finding is nonspecific, and could reflect a primary CNS neoplasm versus solitary intracranial metastasis. No other mass lesion or abnormal enhancement. Pituitary gland suprasellar region within normal limits. Vascular: Major intracranial vascular flow voids are maintained. Skull and upper cervical spine: Craniocervical ungual limits. Bone marrow signal intensity normal. No scalp soft tissue abnormality. Sinuses/Orbits: Globes orbital soft tissues within normal limits. Paranasal sinuses are largely clear. No significant mastoid effusion. Other: None. IMPRESSION: 1. 2.8 x 2.6 x 2.0 cm enhancing mass centered at the subcortical left frontal lobe, with extensive surrounding vasogenic  edema and 9 mm of left-to-right shift. Primary differential considerations include a solitary intracranial metastasis versus primary CNS neoplasm. 2. Underlying moderately advanced chronic microvascular ischemic disease. Electronically Signed   By: Rise Mu M.D.   On: 09/18/2023 00:51   DG Humerus Right Result Date: 09/17/2023 CLINICAL DATA:  Fall, right arm deformity EXAM: RIGHT HUMERUS - 2+ VIEW COMPARISON:  None Available. 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 Mild degenerative changes of the right shoulder. 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 Electronically Signed   By: Regina [name] M.D.   On: 09/17/2023 21:40   CT CHEST ABDOMEN PELVIS W CONTRAST Result Date: 09/17/2023 CLINICAL DATA:  Left frontal lobe mass, possible metastatic lesion, assessment for other metastatic disease or primary. * Tracking Code: BO * EXAM: CT CHEST, ABDOMEN, AND PELVIS WITH CONTRAST TECHNIQUE: Multidetector CT imaging of the chest, abdomen and pelvis was performed following the standard protocol during bolus administration of intravenous contrast. RADIATION DOSE REDUCTION: This exam was performed according to the departmental dose-optimization program which includes automated exposure control, adjustment of the mA and/or kV according to patient size and/or use of iterative reconstruction technique. CONTRAST:  60mL OMNIPAQUE IOHEXOL 350 MG/ML SOLN COMPARISON:  05/25/2023 FINDINGS: CT CHEST FINDINGS Cardiovascular: Mild atheromatous vascular calcification of the thoracic aorta. Mediastinum/Nodes: Pathologic right supraclavicular, right paratracheal, bilateral hilar, subcarinal, and bilateral infrahilar adenopathy. Index right paratracheal node 1.4 cm in short axis on image 17 series 3. Index left hilar node 1.3 cm in short axis on image 26 series 3. Lungs/Pleura: Substantial biapical  pleuroparenchymal scarring. This has an asymmetric nodular component in the right upper lobe measuring 2.9 by 1.5 by 1.8 cm (volume = 4.1 cm^3) which could be from scarring or tumor. Extensive emphysema.  Honeycombing at the lung bases. Musculoskeletal: Unremarkable CT ABDOMEN PELVIS FINDINGS Hepatobiliary: Unremarkable Pancreas: Unremarkable Spleen: Unremarkable Adrenals/Urinary Tract: There is dense contrast in the renal collecting systems, ureters, and urinary bladder due to recent injection for the CT angiogram. This lowers sensitivity for nonobstructive calculi. No renal or adrenal mass. No obvious abnormal filling defect along the urothelium. Stomach/Bowel: Mild prominence of stool in the rectal vault. Normal appendix. Otherwise unremarkable. Vascular/Lymphatic: Atherosclerosis is present, including aortoiliac atherosclerotic disease. Reproductive: Posterior positioning of the uterus with suspected uterine fibroids and associated calcifications. Otherwise unremarkable. Other: No supplemental non-categorized findings. Musculoskeletal: Unremarkable IMPRESSION: 1. Pathologic right supraclavicular, right paratracheal, bilateral hilar, subcarinal, and bilateral infrahilar adenopathy. 2. Substantial biapical pleuroparenchymal scarring. This has an asymmetric nodular component in the right upper lobe measuring 2.9 by 1.5 by 1.8 cm (volume = 4.1 cm^ 3) which could be from scarring or tumor. PET-CT could help differentiate. 3. Extensive emphysema. 4. Honeycombing at the lung bases, compatible with interstitial lung disease. 5. Mild prominence of stool in the rectal vault. 6. Posterior positioning of the uterus with suspected uterine fibroids and associated calcifications. 7. Aortic atherosclerosis. Aortic Atherosclerosis (ICD10-I70.0) and Emphysema (ICD10-J43.9). Electronically Signed   By: Gaylyn Rong M.D.   On: 09/17/2023 19:23   CT ANGIO HEAD NECK W WO CM (CODE STROKE) Addendum Date: 09/17/2023 ADDENDUM  REPORT: 09/17/2023 19:02 ADDENDUM: Delayed images demonstrate peripheral enhancement the mass measuring 2.6 x 2.3 x 2.1 cm. Other foci of enhancement are present. This most likely represents a solitary metastasis. Electronically Signed   By: Marin Roberts M.D.   On: 09/17/2023 19:02   Result Date: 09/17/2023 CLINICAL DATA:  Code stroke.  Left frontal lobe mass. EXAM: CT ANGIOGRAPHY HEAD AND NECK WITH AND WITHOUT CONTRAST TECHNIQUE: Multidetector CT imaging of the head and neck was performed using the standard protocol during bolus administration of intravenous contrast. Multiplanar CT image reconstructions and MIPs were obtained to evaluate the vascular anatomy. Carotid stenosis measurements (when applicable) are obtained utilizing NASCET criteria, using the distal internal carotid diameter as the denominator. RADIATION DOSE REDUCTION: This exam was performed according to the departmental dose-optimization program which includes automated exposure control, adjustment of the mA and/or kV according to patient size and/or use of iterative reconstruction technique. CONTRAST:  75mL OMNIPAQUE IOHEXOL 350 MG/ML SOLN COMPARISON:  CT head without contrast 09/17/2023 FINDINGS: CTA NECK FINDINGS Aortic arch: A 3 vessel arch configuration is present. The aortic arch is incompletely imaged on this study. No significant focal stenosis is present the great vessel origins. Right carotid system: Atherosclerotic calcifications are present at the right carotid bifurcation predominantly involving the external carotid artery. The right common carotid artery is normal. The cervical right ICA is otherwise normal. Left carotid system: The left common carotid artery is within normal limits. Mild noncalcified plaque is present posteriorly at the bifurcation without significant stenosis. The cervical left ICA is otherwise normal. Vertebral arteries:  The left vertebral artery is dominant vessel. Both vertebral arteries originate from  the subclavian arteries without significant stenosis. No significant stenosis is present in either vertebral artery in the neck. Skeleton: Vertebral body heights and alignment are normal. No focal osseous lesions are present. The patient is edentulous. Other neck: Soft tissues the neck are otherwise unremarkable. Salivary glands are within normal limits. Thyroid is normal. No significant adenopathy is present. No focal mucosal or submucosal lesions are present. Upper chest: Central and paraseptal emphysematous changes are present. Peripheral lung nodules are present in the right upper lobe. Please see dedicated CT of the chest for further description. No pneumothorax is present. Paratracheal adenopathy is present. Review of the MIP images confirms the above findings CTA HEAD FINDINGS Anterior circulation: Internal carotid arteries are within normal limits the skull base to the ICA termini. The A1 and M1 segments are normal. Anterior communicating artery is patent. Mass effect deviates the left MCA and bilateral ACA branch vessels. No significant proximal stenosis or occlusion is present. Posterior circulation: The PICA origins are visualized and normal. Scratched at the left PICA origin is visualized and normal. The right PICA scratched at the hypoplastic right vertebral artery terminates at the PICA. The basilar artery is normal. The superior cerebellar arteries are patent. Posterior cerebral arteries originate from basilar tip. A right posterior communicating artery contributes. The PCA branch vessels are normal bilaterally. No aneurysm is present. Venous sinuses: The dural sinuses are patent. The straight sinus and deep cerebral veins are intact. Cortical veins are within normal limits. No significant vascular malformation is evident. Anatomic variants: None Review of the MIP images confirms the above findings IMPRESSION: 1. No emergent large vessel occlusion. 2. Mass effect deviates the left MCA and bilateral  ACA branch vessels without significant proximal stenosis or occlusion. 3. Mild atherosclerotic changes at the carotid bifurcations bilaterally without significant stenosis. 4. Peripheral lung nodules in the right upper lobe. Please see dedicated CT of the chest for further description. 5. Paratracheal adenopathy is related to metastatic disease. Please see dedicated CT of the chest for further description. 6.  Emphysema (ICD10-J43.9). Electronically Signed: By: Marin Roberts M.D. On: 09/17/2023 18:53   CT HEAD CODE STROKE WO CONTRAST Result Date: 09/17/2023 CLINICAL DATA:  Code stroke. Neuro deficit, acute, stroke suspected. EXAM: CT HEAD WITHOUT CONTRAST TECHNIQUE: Contiguous axial images were obtained from the base of the skull through the vertex without intravenous contrast. RADIATION DOSE REDUCTION: This exam was performed according to the departmental dose-optimization program which includes automated exposure control, adjustment of the mA and/or kV according to patient size and/or use of iterative reconstruction technique. COMPARISON:  None Available. FINDINGS: Brain: A left frontal lobe mass measures 20 x 20 x 18 mm. Extensive surrounding vasogenic edema present throughout the left frontal lobe. Mass effect is present with effacement of the sulci and 8 mm of left to right midline shift. Asymmetric effacement of the left lateral ventricle is noted. No other discrete lesions are present. No acute cortical infarcts are present. The brainstem and cerebellum are within normal limits. Midline structures are within normal limits. Vascular: No hyperdense vessel or unexpected calcification. Skull: Calvarium is intact. No focal lytic or blastic lesions are present. No significant extracranial soft tissue lesion is present. Sinuses/Orbits: The paranasal sinuses and mastoid air cells are clear. The globes and orbits are within normal limits. IMPRESSION: 1. 20 x 20 x 18 mm left frontal lobe mass with extensive  surrounding vasogenic edema. This is concerning for a  primary or metastatic neoplasm. Recommend MRI the brain without and with contrast for further evaluation. 2. Mass effect with effacement of the sulci and 8 mm of left to right midline shift. 3. No other discrete lesions are present. 4. No other acute intracranial abnormality. The above was relayed via text pager to Dr. Milon [name] on 09/17/2023 at 18:25 . Electronically Signed   By: Marin Roberts M.D.   On: 09/17/2023 18:25   DG Chest Portable 1 View Result Date: 09/17/2023 CLINICAL DATA:  Tachycardia. EXAM: PORTABLE CHEST 1 VIEW COMPARISON:  Radiograph 05/25/2023 FINDINGS: The heart is normal in size. Stable mediastinal contours. Chronic hyperinflation and emphysema. There is biapical pleuroparenchymal scarring, similar. Reticular opacities at both lung bases are without significant interval change, typical of honeycombing. No definite acute airspace disease. No features of pulmonary edema. No pneumothorax or significant pleural effusion. IMPRESSION: 1. Chronic hyperinflation and emphysema. 2. Bibasilar reticular opacities, typical of honeycombing, without significant interval change. 3. Biapical pleuroparenchymal scarring. Electronically Signed   By: Narda Rutherford M.D.   On: 09/17/2023 18:02    ASSESSMENT: This is a very pleasant 58 years old African-American female recently diagnosed with a stage IV (t1c, N3, M1 C) non-small cell lung cancer, adenocarcinoma presented with right upper lobe lung mass in addition to extensive bilateral hilar, mediastinal as well as right supraclavicular lymphadenopathy and metastatic disease to the bone in the right arm as well as brain metastasis diagnosed in January 2025. Molecular studies and PD-L1 expression are still pending  PLAN: I had a lengthy discussion with the patient and her niece today about her current disease stage, prognosis and treatment options.  I personally and independently reviewed the  scan images and discussed the results with the patient and her niece. I explained to the patient that she has incurable condition and all the treatment will be of palliative nature.    Stage IV Non-Small Cell Lung Cancer (Adenocarcinoma) Diagnosed with stage IV non-small cell lung cancer (adenocarcinoma) with metastasis to the brain and bone. Initial presentation included aphasia and right arm weakness, leading to hospitalization and discovery of brain tumor with midline shift. Biopsy confirmed adenocarcinoma of the lung. Currently on dexamethasone for brain swelling and Keppra for seizure prevention. Pain managed with oxycodone and acetaminophen. Discussed treatment options including radiation, chemotherapy, and immunotherapy. Explained that without treatment, average survival is 3-6 months, while with treatment, average survival is around 2 years. Patient expressed understanding and willingness to proceed with treatment. - Continue dexamethasone 4 mg PO BID for 4 days, then taper to 2 mg PO BID for 7 days - Continue Keppra 500 mg PO BID - Continue oxycodone and acetaminophen for pain management - Refer to radiation oncologist for brain and arm pain management - Order molecular marker testing - Arrange follow-up in 2.5 weeks - Refer to palliative care team for pain management and supportive care - Refer to social worker for additional support  Right Arm Pain and Fracture Right arm pain rated 5/10, managed with oxycodone. X-rays confirmed fracture. Provided a sling but has not seen an orthopedic surgeon for further management. - Refer to orthopedic surgeon for evaluation and management of right arm fracture - Coordinate with Dr. Willia Craze, orthopedic surgeon, for follow-up care  Chronic Obstructive Pulmonary Disease (COPD) COPD, no current exacerbation or symptoms reported. - Monitor for symptoms and manage as needed  Hypertension Hypertension, no current symptoms reported. - Monitor  blood pressure and manage as needed  Anxiety and Depression Anxiety and depression, no  current symptoms reported. - Monitor mental health and provide support as needed  General Health Maintenance Smoking for over 40 years and continues to smoke. Discussed the importance of smoking cessation. - Encourage smoking cessation - Monitor alcohol consumption and provide support for reduction if needed  Follow-up - Schedule follow-up appointment in 2.5 weeks - Coordinate referrals to radiation oncologist, palliative care team, social worker, and orthopedic surgeon.    The patient voices understanding of current disease status and treatment options and is in agreement with the current care plan.  All questions were answered. The patient knows to call the clinic with any problems, questions or concerns. We can certainly see the patient much sooner if necessary.  Thank you so much for allowing me to participate in the care of Brenee Archambeau. I will continue to follow up the patient with you and assist in her care.  The total time spent in the appointment was 90 minutes.  Disclaimer: This note was dictated with voice recognition software. Similar sounding words can inadvertently be transcribed and may not be corrected upon review.   Demarko Zeimet K Galileo Colello September 22, 2023, 1:26 PM

## 2023-09-23 NOTE — Progress Notes (Signed)
Delayed entry: I met the pt for the first time on 1/16 at her consult appt with Dr Arbutus Ped. Pt was accompanied by her niece, [name]. Pt did have difficulty answering questions and often deferred to her niece. The plan for the pt is for her to have a PET scan to complete her staging work-up.  A referral for social work was placed as pt has a very limited local support network. A referral for radiation oncology was placed to address the pt's arm pain and brain mets.  A referral to ortho to address pt's arm fracture.  A referral to palliative care was placed to address pain in patient's arm.  I provided the pt and her niece with a Lung cancer journey book and my business card.  At the conclusion of the appt when pt was making her follow up appt, [name] asked me about the social work consult and told me that the pt currently lives at home and is the primary caretaker for her autistic, epileptic brother. She states a visiting nurse comes to help with the pt's brother, but couldn't give me details about which service is used.  I reached out to Ronda Fairly via [facility] to let her know about the referral. I encouraged [name] to reach out to me with any questions or concerns.  A request for the pt's tissue be sent to [facility] for molecular testing was emailed to Lita Mains, [facility] Psychiatric pathology technician.

## 2023-09-26 ENCOUNTER — Telehealth: Payer: Self-pay | Admitting: Internal Medicine

## 2023-09-26 ENCOUNTER — Telehealth: Payer: Self-pay | Admitting: Urology

## 2023-09-26 ENCOUNTER — Telehealth: Payer: Self-pay | Admitting: Radiation Oncology

## 2023-09-26 ENCOUNTER — Other Ambulatory Visit: Payer: Self-pay | Admitting: Radiation Therapy

## 2023-09-26 ENCOUNTER — Inpatient Hospital Stay: Payer: 59 | Admitting: Licensed Clinical Social Worker

## 2023-09-26 DIAGNOSIS — C7931 Secondary malignant neoplasm of brain: Secondary | ICD-10-CM

## 2023-09-26 DIAGNOSIS — C3490 Malignant neoplasm of unspecified part of unspecified bronchus or lung: Secondary | ICD-10-CM

## 2023-09-26 NOTE — Telephone Encounter (Signed)
Accidentally clicked the wrong provider.

## 2023-09-26 NOTE — Telephone Encounter (Signed)
1/20 @ 3:17 pm Called both patient's contract numbers, Left message with patient's niece for patient to call our office to be schedule for consult.

## 2023-09-26 NOTE — Telephone Encounter (Signed)
The patient informed me that she does not want to be seen at this time due to a broken arm. She wants a call back at a later time once she gets her arm taken care of.

## 2023-09-27 ENCOUNTER — Telehealth: Payer: Self-pay | Admitting: Radiation Oncology

## 2023-09-27 NOTE — Progress Notes (Signed)
Email sent to [employer], Presenter, broadcasting, to see if he can help with pts transportation needs once treatment begins.

## 2023-09-27 NOTE — Progress Notes (Signed)
[facility] Clinical Social Work  Initial Assessment   Regina [name] is a 58 y.o. year old female contacted by phone. Pt requested CSW contact her niece [name] [phone]) to obtain details for assessment as pt is overwhelmed by all the calls she is receiving from the cancer center.  CSW contacted niece to gather information.  Clinical Social Work was referred by medical provider for assessment of psychosocial needs.   SDOH (Social Determinants of Health) assessments performed: Yes SDOH Interventions    Flowsheet Row Telephone from 09/21/2023 in [place] POPULATION HEALTH DEPARTMENT  SDOH Interventions   Food Insecurity Interventions Intervention Not Indicated  Housing Interventions Intervention Not Indicated  Transportation Interventions Intervention Not Indicated, Patient Resources (Friends/Family)  Utilities Interventions Intervention Not Indicated       SDOH Screenings   Food Insecurity: No Food Insecurity (09/21/2023)  Housing: Low Risk  (09/21/2023)  Transportation Needs: No Transportation Needs (09/21/2023)  Utilities: Not At Risk (09/21/2023)  Depression (PHQ2-9): Low Risk  (06/08/2023)  Tobacco Use: Medium Risk (09/17/2023)     Distress Screen completed: No     No data to display            Family/Social Information:  Housing Arrangement: patient lives with her older brother who has epilepsy and is autistic.  Per niece pt's brother needs oversight daily for medication management and preparation of meals.  Family members/support persons in your life? Pt has very limited support.  Pt's niece assists as she is able and as pt is willing to accept.  Pt's niece works full time and is not available to provide transportation to appointments or to be present for appointments.  Pt reportedly has a son; however, he has reportedly not been involved in providing support since pt's hospitalization.  CSW discussed the challenges regarding pt's expected treatment needs as well as  pt's brother's needs at length w/ niece.   Transportation concerns: at present pt is driving; however, w/ metastatic disease to the brain it is uncertain if pt will continue to be able to drive.  Pt has Medicaid and may have transportation benefits which pt's niece was made aware of.  Employment: Working part time per pt's niece pt is working at [employer]; however, it is unclear if pt has any benefits from work.  It appears it may be unlikely she will be eligible for any form of short term disability from work.  CSW discussed sending a referral to the [facility] w/ pt's niece and will attempt to meet directly w/ pt at her next visit to further discuss.    Income source: Employment Financial concerns: Yes, current concerns Type of concern: Utilities and Rent/ mortgage Food access concerns: no Religious or spiritual practice: Data processing manager Currently in place:  none  Coping/ Adjustment to diagnosis: Patient understands treatment plan and what happens next? yes Concerns about diagnosis and/or treatment: Overwhelmed by information and How will I care for myself Patient reported stressors: Finances, Anxiety/ nervousness, and Adjusting to my illness Hopes and/or priorities: pt's priority is to complete diagnostics and establish a treatment plan w/ the hope of positive results Patient enjoys  not discussed Current coping skills/ strengths: Capable of independent living  and Motivation for treatment/growth     SUMMARY: Current SDOH Barriers:  Financial constraints related to limited income and Limited social support  Clinical Social Work Clinical Goal(s):  Explore community resource options for unmet needs related to:  Financial Strain   Interventions: Discussed common feeling and emotions when being diagnosed  with cancer, and the importance of support during treatment Informed patient of the support team roles and support services at [facility] Provided CSW contact information and encouraged  patient to call with any questions or concerns Provided niece w/ information regarding transportation resources, referral to the [facility], as well as the [employer].   Follow Up Plan:  CSW will meet w/ pt in person at next oncology appt. Patient verbalizes understanding of plan: Yes    Rachel Moulds, LCSW Clinical Social Worker [facility]  Patient is participating in a Managed Medicaid Plan:  Yes

## 2023-09-27 NOTE — Telephone Encounter (Signed)
1/21 @ 8:15 am Called and spoke to patient she decline consult at this time till her broken arm be address first, then she will consider being seen for consult in RadOnc.  Secure chat sent to Jalene Mullet, so they are aware.

## 2023-09-29 ENCOUNTER — Telehealth: Payer: Self-pay | Admitting: Radiation Therapy

## 2023-09-29 ENCOUNTER — Ambulatory Visit (HOSPITAL_COMMUNITY): Payer: 59

## 2023-09-29 ENCOUNTER — Other Ambulatory Visit: Payer: Self-pay

## 2023-09-29 ENCOUNTER — Encounter (HOSPITAL_COMMUNITY): Payer: Self-pay

## 2023-09-29 ENCOUNTER — Ambulatory Visit: Payer: 59 | Admitting: Orthopedic Surgery

## 2023-09-29 NOTE — Telephone Encounter (Signed)
I called to introduce myself and share my contact information in case she has questions during her radiation treatment planning set up.   Jalene Mullet R.T(R)(T) Radiation Special Procedures Lead

## 2023-09-29 NOTE — Patient Outreach (Signed)
  Care Management  Transitions of Care Program Managed Medicaid Transitions of Care week 2  09/29/2023 Name: Regina [name] MRN: [id number] DOB: 1966/01/17  Subjective: Regina [name] is a 58 y.o. year old female who is a primary care patient of Regina Sessions, NP. The Care Management team was unable to reach the patient by phone to assess and address transitions of care needs.   Plan: Additional outreach attempts will be made to reach the patient enrolled in the [facility] Program (Post Inpatient/ED Visit).  Regina Chimera, RN, BSN, CEN [employer]- Transition of Care Team.  [facility] [phone]

## 2023-09-30 ENCOUNTER — Inpatient Hospital Stay: Payer: 59 | Admitting: Licensed Clinical Social Worker

## 2023-09-30 ENCOUNTER — Ambulatory Visit (HOSPITAL_COMMUNITY): Admission: RE | Admit: 2023-09-30 | Payer: 59 | Source: Ambulatory Visit

## 2023-09-30 ENCOUNTER — Telehealth: Payer: Self-pay

## 2023-09-30 ENCOUNTER — Other Ambulatory Visit: Payer: Self-pay | Admitting: Radiation Therapy

## 2023-09-30 ENCOUNTER — Telehealth: Payer: Self-pay | Admitting: Radiation Therapy

## 2023-09-30 DIAGNOSIS — C3490 Malignant neoplasm of unspecified part of unspecified bronchus or lung: Secondary | ICD-10-CM

## 2023-09-30 NOTE — Progress Notes (Signed)
[facility] CSW Progress Note  Visual merchandiser  spoke w/ pt's niece over the phone.  Pt's niece reports she went to pt's home to bring her to her PET scan appointment; however, the pt was not home.  Pt's car was up on a jack and the back tire was missing.  Pt's son reportedly told her niece that he believes someone gave her a ride to assist w/ fixing the tire.  Pt did not go to her ortho follow up and has now missed her PET scan.  Pt's niece will speak w/ pt over the weekend.  CSW to follow up on Monday w/ a call to check in on pt and strongly encourage her to attend Tuesday's appt w/ Dr. Mitzi Hansen.  CSW to remain available as appropriate throughout duration of treatment.      Rachel Moulds, LCSW Clinical Social Worker [facility]    Patient is participating in a Managed Medicaid Plan:  Yes

## 2023-09-30 NOTE — Patient Outreach (Signed)
  Care Management  Transitions of Care Program Transitions of Care Post-discharge week 2  09/30/2023 Name: Regina [name] MRN: [id number] DOB: 1966-09-04  Subjective: Regina [name] is a 58 y.o. year old female who is a primary care patient of [name], NP. The Care Management team was unable to reach the patient by phone to assess and address transitions of care needs.   Plan: Additional outreach attempts will be made to reach the patient enrolled in the [facility] Program (Post Inpatient/ED Visit).  Lonia Chimera, RN, BSN, CEN [employer]- Transition of Care Team.  [facility] [phone]

## 2023-09-30 NOTE — Telephone Encounter (Signed)
I called Regina [name] to remind her of the PET scan scheduled today and to not eat after 8:30am. Unfortunately she did not answer and I was unable to leave a message due to her box being full.  Shortly after that, I received an email from her niece stating that she has also not been able to reach Regina [name] and is unable to take her to the PET  scan this afternoon. I am not sure if other transportation has been arranged by the patient, but a message has been sent to Regina [name], the Cancer Center transportation coordinator as well to see if assistance is available for future appointments.   Regina [name] R.T.(R)(T) Radiation Special Procedures Lead

## 2023-10-01 ENCOUNTER — Other Ambulatory Visit: Payer: Self-pay

## 2023-10-01 ENCOUNTER — Emergency Department (HOSPITAL_COMMUNITY): Payer: MEDICAID

## 2023-10-01 ENCOUNTER — Inpatient Hospital Stay (HOSPITAL_COMMUNITY)
Admission: EM | Admit: 2023-10-01 | Discharge: 2023-10-13 | DRG: 025 | Disposition: A | Discharge status: Home / Self Care | Payer: MEDICAID | Attending: Neurosurgery | Admitting: Neurosurgery

## 2023-10-01 ENCOUNTER — Telehealth: Payer: Self-pay | Admitting: Internal Medicine

## 2023-10-01 ENCOUNTER — Encounter (HOSPITAL_COMMUNITY): Payer: Self-pay

## 2023-10-01 DIAGNOSIS — G060 Intracranial abscess and granuloma: Secondary | ICD-10-CM | POA: Diagnosis present

## 2023-10-01 DIAGNOSIS — G9389 Other specified disorders of brain: Secondary | ICD-10-CM

## 2023-10-01 DIAGNOSIS — C3490 Malignant neoplasm of unspecified part of unspecified bronchus or lung: Secondary | ICD-10-CM

## 2023-10-01 DIAGNOSIS — R29810 Facial weakness: Secondary | ICD-10-CM | POA: Diagnosis present

## 2023-10-01 DIAGNOSIS — I10 Essential (primary) hypertension: Secondary | ICD-10-CM | POA: Diagnosis present

## 2023-10-01 DIAGNOSIS — C3411 Malignant neoplasm of upper lobe, right bronchus or lung: Secondary | ICD-10-CM | POA: Diagnosis present

## 2023-10-01 DIAGNOSIS — S42391A Other fracture of shaft of right humerus, initial encounter for closed fracture: Secondary | ICD-10-CM | POA: Diagnosis not present

## 2023-10-01 DIAGNOSIS — J849 Interstitial pulmonary disease, unspecified: Secondary | ICD-10-CM | POA: Diagnosis present

## 2023-10-01 DIAGNOSIS — F418 Other specified anxiety disorders: Secondary | ICD-10-CM | POA: Diagnosis not present

## 2023-10-01 DIAGNOSIS — J449 Chronic obstructive pulmonary disease, unspecified: Secondary | ICD-10-CM | POA: Diagnosis present

## 2023-10-01 DIAGNOSIS — Z681 Body mass index (BMI) 19 or less, adult: Secondary | ICD-10-CM | POA: Diagnosis not present

## 2023-10-01 DIAGNOSIS — F32A Depression, unspecified: Secondary | ICD-10-CM | POA: Diagnosis present

## 2023-10-01 DIAGNOSIS — Z91030 Bee allergy status: Secondary | ICD-10-CM

## 2023-10-01 DIAGNOSIS — G936 Cerebral edema: Principal | ICD-10-CM

## 2023-10-01 DIAGNOSIS — Z7189 Other specified counseling: Secondary | ICD-10-CM | POA: Diagnosis not present

## 2023-10-01 DIAGNOSIS — Z91014 Allergy to mammalian meats: Secondary | ICD-10-CM

## 2023-10-01 DIAGNOSIS — E871 Hypo-osmolality and hyponatremia: Secondary | ICD-10-CM | POA: Diagnosis not present

## 2023-10-01 DIAGNOSIS — R627 Adult failure to thrive: Secondary | ICD-10-CM | POA: Diagnosis present

## 2023-10-01 DIAGNOSIS — C719 Malignant neoplasm of brain, unspecified: Secondary | ICD-10-CM | POA: Diagnosis not present

## 2023-10-01 DIAGNOSIS — Z8249 Family history of ischemic heart disease and other diseases of the circulatory system: Secondary | ICD-10-CM

## 2023-10-01 DIAGNOSIS — Z79899 Other long term (current) drug therapy: Secondary | ICD-10-CM

## 2023-10-01 DIAGNOSIS — R4182 Altered mental status, unspecified: Secondary | ICD-10-CM | POA: Diagnosis not present

## 2023-10-01 DIAGNOSIS — D499 Neoplasm of unspecified behavior of unspecified site: Secondary | ICD-10-CM | POA: Diagnosis present

## 2023-10-01 DIAGNOSIS — C7951 Secondary malignant neoplasm of bone: Secondary | ICD-10-CM | POA: Diagnosis present

## 2023-10-01 DIAGNOSIS — C7931 Secondary malignant neoplasm of brain: Secondary | ICD-10-CM | POA: Diagnosis present

## 2023-10-01 DIAGNOSIS — Z515 Encounter for palliative care: Secondary | ICD-10-CM

## 2023-10-01 DIAGNOSIS — R4701 Aphasia: Secondary | ICD-10-CM | POA: Diagnosis present

## 2023-10-01 DIAGNOSIS — J439 Emphysema, unspecified: Secondary | ICD-10-CM | POA: Diagnosis present

## 2023-10-01 DIAGNOSIS — M84521G Pathological fracture in neoplastic disease, right humerus, subsequent encounter for fracture with delayed healing: Secondary | ICD-10-CM | POA: Diagnosis not present

## 2023-10-01 DIAGNOSIS — L89152 Pressure ulcer of sacral region, stage 2: Secondary | ICD-10-CM | POA: Diagnosis not present

## 2023-10-01 DIAGNOSIS — M84421A Pathological fracture, right humerus, initial encounter for fracture: Secondary | ICD-10-CM | POA: Diagnosis not present

## 2023-10-01 DIAGNOSIS — R64 Cachexia: Secondary | ICD-10-CM | POA: Diagnosis present

## 2023-10-01 DIAGNOSIS — S42301A Unspecified fracture of shaft of humerus, right arm, initial encounter for closed fracture: Secondary | ICD-10-CM

## 2023-10-01 DIAGNOSIS — M79621 Pain in right upper arm: Secondary | ICD-10-CM | POA: Diagnosis not present

## 2023-10-01 DIAGNOSIS — G935 Compression of brain: Secondary | ICD-10-CM | POA: Diagnosis present

## 2023-10-01 DIAGNOSIS — E43 Unspecified severe protein-calorie malnutrition: Secondary | ICD-10-CM | POA: Diagnosis present

## 2023-10-01 DIAGNOSIS — Z806 Family history of leukemia: Secondary | ICD-10-CM

## 2023-10-01 DIAGNOSIS — F419 Anxiety disorder, unspecified: Secondary | ICD-10-CM | POA: Diagnosis present

## 2023-10-01 DIAGNOSIS — G939 Disorder of brain, unspecified: Secondary | ICD-10-CM | POA: Diagnosis not present

## 2023-10-01 DIAGNOSIS — L8990 Pressure ulcer of unspecified site, unspecified stage: Secondary | ICD-10-CM | POA: Insufficient documentation

## 2023-10-01 DIAGNOSIS — R569 Unspecified convulsions: Secondary | ICD-10-CM | POA: Diagnosis not present

## 2023-10-01 DIAGNOSIS — R220 Localized swelling, mass and lump, head: Secondary | ICD-10-CM | POA: Diagnosis not present

## 2023-10-01 DIAGNOSIS — Z87891 Personal history of nicotine dependence: Secondary | ICD-10-CM

## 2023-10-01 DIAGNOSIS — C801 Malignant (primary) neoplasm, unspecified: Secondary | ICD-10-CM | POA: Diagnosis not present

## 2023-10-01 DIAGNOSIS — G9341 Metabolic encephalopathy: Secondary | ICD-10-CM | POA: Diagnosis not present

## 2023-10-01 DIAGNOSIS — M84421G Pathological fracture, right humerus, subsequent encounter for fracture with delayed healing: Secondary | ICD-10-CM

## 2023-10-01 DIAGNOSIS — Z510 Encounter for antineoplastic radiation therapy: Secondary | ICD-10-CM | POA: Diagnosis not present

## 2023-10-01 HISTORY — DX: Other specified disorders of brain: G93.89

## 2023-10-01 LAB — CBC WITH DIFFERENTIAL/PLATELET
Abs Immature Granulocytes: 0.09 10*3/uL — ABNORMAL HIGH (ref 0.00–0.07)
Basophils Absolute: 0 10*3/uL (ref 0.0–0.1)
Basophils Relative: 0 %
Eosinophils Absolute: 0.2 10*3/uL (ref 0.0–0.5)
Eosinophils Relative: 2 %
HCT: 43.5 % (ref 36.0–46.0)
Hemoglobin: 14.7 g/dL (ref 12.0–15.0)
Immature Granulocytes: 1 %
Lymphocytes Relative: 23 %
Lymphs Abs: 2.2 10*3/uL (ref 0.7–4.0)
MCH: 29.7 pg (ref 26.0–34.0)
MCHC: 33.8 g/dL (ref 30.0–36.0)
MCV: 87.9 fL (ref 80.0–100.0)
Monocytes Absolute: 1 10*3/uL (ref 0.1–1.0)
Monocytes Relative: 10 %
Neutro Abs: 6 10*3/uL (ref 1.7–7.7)
Neutrophils Relative %: 64 %
Platelets: 458 10*3/uL — ABNORMAL HIGH (ref 150–400)
RBC: 4.95 MIL/uL (ref 3.87–5.11)
RDW: 14.5 % (ref 11.5–15.5)
WBC: 9.5 10*3/uL (ref 4.0–10.5)
nRBC: 0 % (ref 0.0–0.2)

## 2023-10-01 LAB — COMPREHENSIVE METABOLIC PANEL
ALT: 14 U/L (ref 0–44)
AST: 20 U/L (ref 15–41)
Albumin: 3.6 g/dL (ref 3.5–5.0)
Alkaline Phosphatase: 84 U/L (ref 38–126)
Anion gap: 12 (ref 5–15)
BUN: 12 mg/dL (ref 6–20)
CO2: 25 mmol/L (ref 22–32)
Calcium: 9.5 mg/dL (ref 8.9–10.3)
Chloride: 100 mmol/L (ref 98–111)
Creatinine, Ser: 0.87 mg/dL (ref 0.44–1.00)
GFR, Estimated: >60 mL/min (ref >60)
Glucose, Bld: 131 mg/dL — ABNORMAL HIGH (ref 70–99)
Potassium: 3.5 mmol/L (ref 3.5–5.1)
Sodium: 137 mmol/L (ref 135–145)
Total Bilirubin: 0.3 mg/dL (ref 0.0–1.2)
Total Protein: 7.1 g/dL (ref 6.5–8.1)

## 2023-10-01 LAB — CBG MONITORING, ED: Glucose-Capillary: 133 mg/dL — ABNORMAL HIGH (ref 70–99)

## 2023-10-01 MED ORDER — ACETAMINOPHEN 325 MG PO TABS
650.0000 mg | ORAL_TABLET | Freq: Four times a day (QID) | ORAL | Status: DC | PRN
Start: 2023-10-01 — End: 2023-10-14
  Administered 2023-10-02 – 2023-10-11 (×2): 650 mg via ORAL
  Filled 2023-10-01 (×2): qty 2

## 2023-10-01 MED ORDER — DEXAMETHASONE SODIUM PHOSPHATE 4 MG/ML IJ SOLN
4.0000 mg | Freq: Four times a day (QID) | INTRAMUSCULAR | Status: DC
  Administered 2023-10-02 – 2023-10-07 (×22): 4 mg via INTRAVENOUS
  Filled 2023-10-01 (×22): qty 1

## 2023-10-01 MED ORDER — SODIUM CHLORIDE 0.9 % IV BOLUS
500.0000 mL | Freq: Once | INTRAVENOUS | Status: AC
  Administered 2023-10-01: 500 mL via INTRAVENOUS

## 2023-10-01 MED ORDER — LEVETIRACETAM IN NACL 500 MG/100ML IV SOLN
500.0000 mg | Freq: Two times a day (BID) | INTRAVENOUS | Status: DC
  Administered 2023-10-01 – 2023-10-03 (×4): 500 mg via INTRAVENOUS
  Filled 2023-10-01 (×4): qty 100

## 2023-10-01 MED ORDER — ACETAMINOPHEN 650 MG RE SUPP: 650.0000 mg | Freq: Four times a day (QID) | RECTAL | Status: DC | PRN

## 2023-10-01 MED ORDER — DEXAMETHASONE SODIUM PHOSPHATE 4 MG/ML IJ SOLN: 4.0000 mg | Freq: Four times a day (QID) | INTRAMUSCULAR | Status: DC

## 2023-10-01 MED ORDER — GADOBUTROL 1 MMOL/ML IV SOLN
5.0000 mL | Freq: Once | INTRAVENOUS | Status: AC | PRN
  Administered 2023-10-01: 5 mL via INTRAVENOUS

## 2023-10-01 MED ORDER — ONDANSETRON HCL 4 MG/2ML IJ SOLN
4.0000 mg | Freq: Four times a day (QID) | INTRAMUSCULAR | Status: DC | PRN
  Administered 2023-10-10: 4 mg via INTRAVENOUS
  Filled 2023-10-01: qty 2

## 2023-10-01 MED ORDER — MORPHINE SULFATE (PF) 4 MG/ML IV SOLN
4.0000 mg | Freq: Once | INTRAVENOUS | Status: AC
Start: 2023-10-01 — End: 2023-10-01
  Administered 2023-10-01: 4 mg via INTRAVENOUS
  Filled 2023-10-01: qty 1

## 2023-10-01 MED ORDER — DEXAMETHASONE SODIUM PHOSPHATE 10 MG/ML IJ SOLN
10.0000 mg | Freq: Once | INTRAMUSCULAR | Status: AC
  Administered 2023-10-01: 10 mg via INTRAVENOUS
  Filled 2023-10-01: qty 1

## 2023-10-01 MED ORDER — ONDANSETRON HCL 4 MG PO TABS
4.0000 mg | ORAL_TABLET | Freq: Four times a day (QID) | ORAL | Status: DC | PRN
  Administered 2023-10-03: 4 mg via ORAL
  Filled 2023-10-01: qty 1

## 2023-10-01 NOTE — ED Provider Notes (Signed)
[place] EMERGENCY DEPARTMENT AT [facility] Provider Note   CSN: [id number] Arrival date & time: 10/01/23  1340     History  Chief Complaint  Patient presents with   Aphasia    Regina [name] is a 58 y.o. female.  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  Pt was admitted from 1/11-1/15 for the brain met after presenting with difficulty speaking and right arm weakness.  She was started on steroids and speaking had improved by d/c.  She did see oncology on 1/16 and was talking to the oncologist then.  She was told then that tx would be palliative.  She was told to continue keppra and dexamethasone.  She was referred to radiation oncologist and a PET scan was ordered.  She missed her PET Scan appointment and is scheduled for an appt with radiation oncology on 1/28.  Pt has a pathologic fx of her right humerus and is not wearing her sling and missed her ortho f/u appt.  Pt has been having increasing difficulty speaking and is unable to tell me anything today other than yes/no.          Home Medications Prior to Admission medications   Medication Sig Start Date End Date Taking? Authorizing Provider  acetaminophen (TYLENOL) 325 MG tablet Take 2 tablets (650 mg total) by mouth every 6 (six) hours as needed for mild pain (pain score 1-3) (or Fever >/= 101). 09/20/23   Azucena Fallen, MD  dexamethasone (DECADRON) 2 MG tablet Take 2 tablets (4 mg total) by mouth 2 (two) times daily with a meal for 4 days, THEN 1 tablet (2 mg total) 2 (two) times daily with a meal for 7 days. 09/20/23 10/01/23  Azucena Fallen, MD  levETIRAcetam (KEPPRA) 500 MG tablet Take 1 tablet (500 mg total) by mouth 2 (two) times daily. 09/20/23   Azucena Fallen, MD  oxyCODONE (ROXICODONE) 5 MG immediate release tablet Take 1 tablet (5 mg total) by mouth every 4 (four) hours as needed for severe pain (pain score  7-10). 09/20/23   Azucena Fallen, MD      Allergies    Pork allergy and Bee venom    Review of Systems   Review of Systems  Neurological:  Positive for speech difficulty.  All other systems reviewed and are negative.   Physical Exam Updated Vital Signs BP (!) 158/94 (BP Location: Left Arm)   Pulse 93   Temp 98.2 F (36.8 C) (Oral)   Resp 17   Ht 5\' 6"  (1.676 m)   Wt 48 kg   LMP 06/06/2010   SpO2 100%   BMI 17.08 kg/m  Physical Exam Vitals and nursing note reviewed.  Constitutional:      Appearance: Normal appearance.  HENT:     Head: Normocephalic.     Right Ear: External ear normal.     Left Ear: External ear normal.     Nose: Nose normal.     Mouth/Throat:     Mouth: Mucous membranes are moist.     Pharynx: Oropharynx is clear.  Eyes:     Extraocular Movements: Extraocular movements intact.     Conjunctiva/sclera: Conjunctivae normal.     Pupils: Pupils are equal, round, and reactive to light.  Cardiovascular:     Rate and Rhythm: Normal rate and regular rhythm.     Pulses: Normal pulses.  Heart sounds: Normal heart sounds.  Pulmonary:     Effort: Pulmonary effort is normal.     Breath sounds: Normal breath sounds.  Abdominal:     General: Abdomen is flat. Bowel sounds are normal.     Palpations: Abdomen is soft.  Musculoskeletal:     Cervical back: Normal range of motion and neck supple.     Comments: Rue pain  Skin:    General: Skin is warm.     Capillary Refill: Capillary refill takes less than 2 seconds.  Neurological:     Mental Status: She is alert.     Comments: Right arm weakness Unable to speak more than a few words.  Psychiatric:        Mood and Affect: Affect is flat.     ED Results / Procedures / Treatments   Labs (all labs ordered are listed, but only abnormal results are displayed) Labs Reviewed  CBC WITH DIFFERENTIAL/PLATELET - Abnormal; Notable for the following components:      Result Value   Platelets 458 (*)    Abs  Immature Granulocytes 0.09 (*)    All other components within normal limits  COMPREHENSIVE METABOLIC PANEL - Abnormal; Notable for the following components:   Glucose, Bld 131 (*)    All other components within normal limits  CBG MONITORING, ED - Abnormal; Notable for the following components:   Glucose-Capillary 133 (*)    All other components within normal limits  URINALYSIS, ROUTINE W REFLEX MICROSCOPIC    EKG None  Radiology MR Brain W and Wo Contrast Result Date: 10/01/2023 CLINICAL DATA:  New onset right arm weakness and progressive aphasia. Metastatic lung cancer. EXAM: MRI HEAD WITHOUT AND WITH CONTRAST TECHNIQUE: Multiplanar, multiecho pulse sequences of the brain and surrounding structures were obtained without and with intravenous contrast. CONTRAST:  5mL GADAVIST GADOBUTROL 1 MMOL/ML IV SOLN COMPARISON:  MR head without and with contrast 09/17/2023 FINDINGS: Brain: The enhancing mass in the left frontal lobe is increased in size over the last 14 days. It now measures 3.2 x 3.4 x 2.5 cm. Surrounding vasogenic edema in the left hemisphere has increased. Progressive mass effect and midline shift is present, now measuring 8 mm. No additional sites of enhancement are present. Periventricular and scattered subcortical T2 hyperintensities bilaterally are otherwise stable. Brainstem and cerebellum are unremarkable. Vascular: Flow is present in the major intracranial arteries. Skull and upper cervical spine: The globes and orbits are within normal limits. Sinuses/Orbits: Right mastoid effusion is present. No obstructing nasopharyngeal lesion is present. The paranasal sinuses are otherwise clear. The globes and orbits are within normal limits. IMPRESSION: 1. Interval increase in size of the enhancing metastatic lesion in the left frontal lobe over the last 14 days. It now measures 3.2 x 3.4 x 2.5 cm. 2. Progressive mass effect and midline shift, now measuring 8 mm. 3. No acute infarct. Lesion  demonstrates restricted diffusion consistent with a high-grade tumor. 4. Right mastoid effusion. No obstructing nasopharyngeal lesion is present. Electronically Signed   By: Marin Roberts M.D.   On: 10/01/2023 19:51   DG Humerus Right Result Date: 10/01/2023 CLINICAL DATA:  332951 Pain 144615.  Right humerus pain. EXAM: RIGHT HUMERUS - 2+ VIEW COMPARISON:  09/17/2023. FINDINGS: There is an approximately 6.6 cm long segment of the mid diaphysis of the right humerus which exhibits significant bone destruction/resorption with permeative pattern. Findings are compatible with pathological fracture. There is interval progression of bone destruction when compared to the prior exam. No  other acute fracture or dislocation. No aggressive osseous lesion. Mild degenerative changes of imaged joints. No radiopaque foreign bodies. Soft tissues are within normal limits. IMPRESSION: *Interval progression of pathological fracture of mid diaphysis of the right humerus. *Otherwise no acute fracture or dislocation. Electronically Signed   By: Jules Schick M.D.   On: 10/01/2023 18:41   DG Chest Portable 1 View Result Date: 10/01/2023 CLINICAL DATA:  Altered mental status. EXAM: PORTABLE CHEST 1 VIEW COMPARISON:  Radiograph and chest CT scan from 09/17/2023. FINDINGS: Bilateral lungs appear hyperexpanded and hyperlucent with coarse bronchovascular markings, in keeping with COPD. There are bibasilar reticular opacities, which correlates with honeycombing on prior CT scan. There are nonspecific opacities predominantly in the bilateral upper lung zones, laterally, which corresponds to pleuroparenchymal disease. No acute lung consolidation or lung collapse. Bilateral lateral costophrenic angles are clear. Stable cardio-mediastinal silhouette. No acute osseous abnormalities. The soft tissues are within normal limits. IMPRESSION: 1. No acute cardiopulmonary abnormality. 2. Redemonstration of chronic lung parenchymal changes, as  described above. Electronically Signed   By: Jules Schick M.D.   On: 10/01/2023 16:34    Procedures Procedures    Medications Ordered in ED Medications  sodium chloride 0.9 % bolus 500 mL (has no administration in time range)  dexamethasone (DECADRON) injection 10 mg (has no administration in time range)  morphine (PF) 4 MG/ML injection 4 mg (has no administration in time range)  dexamethasone (DECADRON) injection 4 mg (has no administration in time range)  gadobutrol (GADAVIST) 1 MMOL/ML injection 5 mL (5 mLs Intravenous Contrast Given 10/01/23 1900)    ED Course/ Medical Decision Making/ A&P                                 Medical Decision Making Amount and/or Complexity of Data Reviewed Radiology: ordered.  Risk Prescription drug management. Decision regarding hospitalization.   This patient presents to the ED for concern of speech difficulty, this involves an extensive number of treatment options, and is a complaint that carries with it a high risk of complications and morbidity.  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   Additional history obtained:  Additional history obtained from epic chart review External records from outside source obtained and reviewed including niece   Lab Tests:  I Ordered, and personally interpreted labs.  The pertinent results include:  cbc nl, cmp nl,    Imaging Studies ordered:  I ordered imaging studies including cxr, ct head, mri brain  I independently visualized and interpreted imaging which showed  CXR: 1. No acute cardiopulmonary abnormality.  2. Redemonstration of chronic lung parenchymal changes, as described  above.  R humerus: *Interval progression of pathological fracture of mid diaphysis of  the right humerus.   *Otherwise no acute fracture or dislocation.  MRI brain: . Interval increase in size of the enhancing metastatic lesion in  the left frontal lobe over the last 14 days. It now measures 3.2 x  3.4 x 2.5 cm.  2. Progressive mass effect and midline shift, now measuring 8 mm.  3. No acute infarct. Lesion demonstrates restricted diffusion  consistent with a high-grade tumor.  4. Right mastoid effusion. No obstructing nasopharyngeal lesion is  present.   I agree with the radiologist interpretation  Cardiac Monitoring:  The patient was maintained on a cardiac monitor.  I personally viewed and interpreted the cardiac monitored which showed an underlying rhythm of: nsr   Medicines ordered and prescription drug management:  I ordered medication including decadron  for vasogenic edema  Reevaluation of the patient after these medicines showed that the patient improved I have reviewed the patients home medicines and have made adjustments as needed   Test Considered:  mri   Critical Interventions:  decadron   Consultations Obtained:  I requested consultation with the oncologist (Dr. Pamelia Hoit),  and discussed lab and imaging findings as well as pertinent plan - he recommends 4 mg IV decadron q 6 hours and they will get rad onc on board in the am Pt d/w Dr. Joneen Roach (triad) who will admit.   Problem List / ED Course:  Worsening brain met/swelling:  iv decadron given.  Pt will need admission for IV decadron and maybe radiation in am.   Pathologic fx R humerus:  sling applied/morphine given   Reevaluation:  After the interventions noted above, I reevaluated the patient and found that they have :improved   Social Determinants of Health:  Lives at home.     Dispostion:  After consideration of the diagnostic results and the patients response to treatment, I feel that the patent would benefit from admission.            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

## 2023-10-01 NOTE — Telephone Encounter (Signed)
Marland Kitchen

## 2023-10-01 NOTE — ED Provider Triage Note (Addendum)
Emergency Medicine Provider Triage Evaluation Note  Regina [name] , a 58 y.o. female  was evaluated in triage.  Pt complains of worsening aphasia and R arm weakness started 4 days ago. States Sx are worse than previous. Previously seen on 09/19/23 for same Sx where she was diagnosed with a brain tumor. Has seen oncologist 07/22/2024 w/ no neuro deficits at that time but did complain of R arm pain. Scheduled to undergo MRI and PET scan. Still able to ambulate without difficulty.   She grabs her inguinal region saying "there is a problem." But denies bleeding, itching, dysuria, vaginal discharge.   Review of Systems  Positive: See above Negative: See above   Physical Exam  BP (!) 163/93 (BP Location: Left Arm)   Pulse (!) 102   Temp 97.8 F (36.6 C) (Oral)   Resp (!) 22   Ht 5\' 6"  (1.676 m)   Wt 48 kg   LMP 06/06/2010   SpO2 100%   BMI 17.08 kg/m  Gen:   Awake, no distress   Resp:  Normal effort  MSK:   Unable to move R arm.  Other:  Severe aphasia  Medical Decision Making  Medically screening exam initiated at 2:01 PM.  Appropriate orders placed.  Wynona Canes was informed that the remainder of the evaluation will be completed by another provider, this initial triage assessment does not replace that evaluation, and the importance of remaining in the ED until their evaluation is complete.     Lunette Stands, PA-C 10/01/23 1413    Lunette Stands, [place] 10/01/23 [phone]

## 2023-10-01 NOTE — ED Triage Notes (Addendum)
Pt presents to triage with marked aphasia and is able to answer yes and no questions. Per chart pt was hospitalized on 1/11 for the same symptoms where she was found to have a brain mass. Pt was discharged and is to follow up with neurology for biopsy. Per pt these symptoms have been persistent since discharge and pt answers "I don't know" when asked if she has had any worsening. Pt has a HA today that is 7/10. Pt has R sided weakness that pt states is unchanged from baseline. Pt lives alone.

## 2023-10-01 NOTE — Progress Notes (Signed)
Orthopedic Tech Progress Note Patient Details:  Regina [name] 1965-11-24 [id number]  Ortho Devices Type of Ortho Device: Sling immobilizer Ortho Device/Splint Location: rue Ortho Device/Splint Interventions: Ordered, Application, Adjustment   Post Interventions Patient Tolerated: Well Instructions Provided: Care of device, Adjustment of device  Trinna Post 10/01/2023, 9:26 PM

## 2023-10-01 NOTE — H&P (Incomplete)
PCP:   [name], NP   Chief Complaint:  Aphasia  HPI: This is an unfortunate 58 year old female with past medical history of anxiety, HTN, COPD.  Patient hospitalized 1/11 to 1/14.  She came in with a patient but was diagnosed with right mid humeral shaft fracture and a brain mass.  She was aphasic at presentation.  She was treated with IV steroids and her speech pattern improved.  Her right arm was placed in a sling with outpatient follow-up with oncology and orthopedics.  Her right humerus was biopsied by IR results read metastatic adenocarcinoma consistent with lung primary.  Patient discharged with follow-up appointments with her doctors as well as a PET scan.  Patient has failed to do the follow-up or go for the PET scan.  He is brought back to the ER today aphasic, able to only assess external to questions.  Question arose regarding medication compliance.  In the ER presenting vitals 163/93, 102, 22, 97.8, sats 100.  Platelets 458 10/01/2023 MRI head:  Interval increase in size of the enhancing metastatic lesion in left frontal lobe over the last 14 days. It now measures 3.2 x 3.4 x 2.5 cm. Progressive mass effect and midline shift, now measures 8 mm  09/16/2013 previous CT head:  20 x 20 x 18 mm left frontal lobe mass with extensive surrounding vasogenic edema. Mass effect with effacement of the sulci and 8 mm of left to right midline shift  Oncologist on-call Dr. Ave Filter consulted.  Recommends a 10 mg IV Decadron now.  4 mg IV every 6.  Likely radiation.  Review of systems Unable to obtain due to patient nonverbal state.  Unable to write due to right humeral fracture.  Past Medical History: Past Medical History:  Diagnosis Date   Allergy    Anxiety    Brain mass    COPD (chronic obstructive pulmonary disease) (HCC)    Emphysema of lung (HCC)    Hypertension    Past Surgical History:  Procedure Laterality Date   dental procedure      Medications: Prior to Admission  medications   Medication Sig Start Date End Date Taking? Authorizing Provider  acetaminophen (TYLENOL) 325 MG tablet Take 2 tablets (650 mg total) by mouth every 6 (six) hours as needed for mild pain (pain score 1-3) (or Fever >/= 101). 09/20/23   Azucena Fallen, MD  dexamethasone (DECADRON) 2 MG tablet Take 2 tablets (4 mg total) by mouth 2 (two) times daily with a meal for 4 days, THEN 1 tablet (2 mg total) 2 (two) times daily with a meal for 7 days. 09/20/23 10/01/23  Azucena Fallen, MD  levETIRAcetam (KEPPRA) 500 MG tablet Take 1 tablet (500 mg total) by mouth 2 (two) times daily. 09/20/23   Azucena Fallen, MD  oxyCODONE (ROXICODONE) 5 MG immediate release tablet Take 1 tablet (5 mg total) by mouth every 4 (four) hours as needed for severe pain (pain score 7-10). 09/20/23   Azucena Fallen, MD    Allergies:   Allergies  Allergen Reactions   Pork Allergy Nausea And Vomiting   Bee Venom Hives    Social History:  reports that she quit smoking about 3 years ago. Her smoking use included cigarettes. She has never used smokeless tobacco. She reports current alcohol use of about 6.0 standard drinks of alcohol per week. She reports current drug use. Frequency: 4.00 times per week. Drug: Marijuana.  Family History: Family History  Problem Relation Age of  Onset   Leukemia Mother    Hypertension Mother    Hypertension Father    Cancer Neg Hx    Diabetes Neg Hx    Heart disease Neg Hx    Colon cancer Neg Hx    Esophageal cancer Neg Hx    Rectal cancer Neg Hx    Stomach cancer Neg Hx     Physical Exam: Vitals:   10/01/23 1615 10/01/23 1815 10/01/23 1919 10/01/23 2038  BP: (!) 154/92 (!) 152/90  (!) 158/94  Pulse: 94 80  93  Resp: 19 (!) 29  17  Temp:   98.2 F (36.8 C)   TempSrc:   Oral   SpO2: 100% 96%  100%  Weight:      Height:        General: Alert female, appears oriented and appropriate, in no distress Eyes: Pink conjunctiva, pupils pinpoint ENT: Moist  oral mucosa, neck supple, no thyromegaly Lungs: CTA B/L, no wheeze, no crackles, no use of accessory muscles Cardiovascular: RRR, no regurgitation, no bruits, no JVD Abdomen: soft, positive BS, NTND, not an acute abdomen GU: not examined Neuro: CN II - XII appears grossly intact, Musculoskeletal: Moves all extremities.  Decreased ROM of right upper extremity.  Arm in sling Skin: no rash, no subcutaneous crepitation, no decubitus Psych: appropriate patient  Labs on Admission:  Recent Labs    10/01/23 1419  NA 137  K 3.5  CL 100  CO2 25  GLUCOSE 131*  BUN 12  CREATININE 0.87  CALCIUM 9.5   Recent Labs    10/01/23 1419  AST 20  ALT 14  ALKPHOS 84  BILITOT 0.3  PROT 7.1  ALBUMIN 3.6    Recent Labs    10/01/23 1419  WBC 9.5  NEUTROABS 6.0  HGB 14.7  HCT 43.5  MCV 87.9  PLT 458*    Radiological Exams on Admission: MR Brain W and Wo Contrast Result Date: 10/01/2023 CLINICAL DATA:  New onset right arm weakness and progressive aphasia. Metastatic lung cancer. EXAM: MRI HEAD WITHOUT AND WITH CONTRAST TECHNIQUE: Multiplanar, multiecho pulse sequences of the brain and surrounding structures were obtained without and with intravenous contrast. CONTRAST:  5mL GADAVIST GADOBUTROL 1 MMOL/ML IV SOLN COMPARISON:  MR head without and with contrast 09/17/2023 FINDINGS: Brain: The enhancing mass in the left frontal lobe is increased in size over the last 14 days. It now measures 3.2 x 3.4 x 2.5 cm. Surrounding vasogenic edema in the left hemisphere has increased. Progressive mass effect and midline shift is present, now measuring 8 mm. No additional sites of enhancement are present. Periventricular and scattered subcortical T2 hyperintensities bilaterally are otherwise stable. Brainstem and cerebellum are unremarkable. Vascular: Flow is present in the major intracranial arteries. Skull and upper cervical spine: The globes and orbits are within normal limits. Sinuses/Orbits: Right mastoid  effusion is present. No obstructing nasopharyngeal lesion is present. The paranasal sinuses are otherwise clear. The globes and orbits are within normal limits. IMPRESSION: 1. Interval increase in size of the enhancing metastatic lesion in the left frontal lobe over the last 14 days. It now measures 3.2 x 3.4 x 2.5 cm. 2. Progressive mass effect and midline shift, now measuring 8 mm. 3. No acute infarct. Lesion demonstrates restricted diffusion consistent with a high-grade tumor. 4. Right mastoid effusion. No obstructing nasopharyngeal lesion is present. Electronically Signed   By: Marin Roberts M.D.   On: 10/01/2023 19:51   DG Humerus Right Result Date: 10/01/2023 CLINICAL DATA:  [id number] Pain [id number].  Right humerus pain. EXAM: RIGHT HUMERUS - 2+ VIEW COMPARISON:  09/17/2023. FINDINGS: There is an approximately 6.6 cm long segment of the mid diaphysis of the right humerus which exhibits significant bone destruction/resorption with permeative pattern. Findings are compatible with pathological fracture. There is interval progression of bone destruction when compared to the prior exam. No other acute fracture or dislocation. No aggressive osseous lesion. Mild degenerative changes of imaged joints. No radiopaque foreign bodies. Soft tissues are within normal limits. IMPRESSION: *Interval progression of pathological fracture of mid diaphysis of the right humerus. *Otherwise no acute fracture or dislocation. Electronically Signed   By: Jules Schick M.D.   On: 10/01/2023 18:41   DG Chest Portable 1 View Result Date: 10/01/2023 CLINICAL DATA:  Altered mental status. EXAM: PORTABLE CHEST 1 VIEW COMPARISON:  Radiograph and chest CT scan from 09/17/2023. FINDINGS: Bilateral lungs appear hyperexpanded and hyperlucent with coarse bronchovascular markings, in keeping with COPD. There are bibasilar reticular opacities, which correlates with honeycombing on prior CT scan. There are nonspecific opacities predominantly  in the bilateral upper lung zones, laterally, which corresponds to pleuroparenchymal disease. No acute lung consolidation or lung collapse. Bilateral lateral costophrenic angles are clear. Stable cardio-mediastinal silhouette. No acute osseous abnormalities. The soft tissues are within normal limits. IMPRESSION: 1. No acute cardiopulmonary abnormality. 2. Redemonstration of chronic lung parenchymal changes, as described above. Electronically Signed   By: Jules Schick M.D.   On: 10/01/2023 16:34    Assessment/Plan Present on Admission:  Vasogenic edema due to neoplasm   Primary adenocarcinoma of upper lobe of right lung [facility]) -Oncologist on-call alerted.  IV Decadron given in ER, will continue per oncologist recommendations -Plans for radiation while inpatient -IV Keppra 500 mg IV twice daily continued for seizure prophylaxis   Pathological right humeral fracture -Seen by orthopedics last inpatient visit.  Arrangements made for outpatient follow-up   Elevated blood pressure -As needed Lopressor ordered  Jaunice Mirza 10/01/2023, 8:42 PM

## 2023-10-02 DIAGNOSIS — C3411 Malignant neoplasm of upper lobe, right bronchus or lung: Secondary | ICD-10-CM | POA: Diagnosis not present

## 2023-10-02 DIAGNOSIS — Z87891 Personal history of nicotine dependence: Secondary | ICD-10-CM | POA: Diagnosis not present

## 2023-10-02 DIAGNOSIS — G936 Cerebral edema: Secondary | ICD-10-CM

## 2023-10-02 DIAGNOSIS — C7931 Secondary malignant neoplasm of brain: Secondary | ICD-10-CM | POA: Diagnosis not present

## 2023-10-02 LAB — COMPREHENSIVE METABOLIC PANEL
ALT: 14 U/L (ref 0–44)
AST: 22 U/L (ref 15–41)
Albumin: 3.2 g/dL — ABNORMAL LOW (ref 3.5–5.0)
Alkaline Phosphatase: 91 U/L (ref 38–126)
Anion gap: 8 (ref 5–15)
BUN: 8 mg/dL (ref 6–20)
CO2: 25 mmol/L (ref 22–32)
Calcium: 9.4 mg/dL (ref 8.9–10.3)
Chloride: 101 mmol/L (ref 98–111)
Creatinine, Ser: 0.85 mg/dL (ref 0.44–1.00)
GFR, Estimated: >60 mL/min (ref >60)
Glucose, Bld: 139 mg/dL — ABNORMAL HIGH (ref 70–99)
Potassium: 4.5 mmol/L (ref 3.5–5.1)
Sodium: 134 mmol/L — ABNORMAL LOW (ref 135–145)
Total Bilirubin: 0.7 mg/dL (ref 0.0–1.2)
Total Protein: 6.7 g/dL (ref 6.5–8.1)

## 2023-10-02 LAB — TSH: TSH: 0.749 u[IU]/mL (ref 0.350–4.500)

## 2023-10-02 LAB — T4, FREE: Free T4: 0.9 ng/dL (ref 0.61–1.12)

## 2023-10-02 LAB — CBC WITH DIFFERENTIAL/PLATELET
Abs Immature Granulocytes: 0.08 10*3/uL — ABNORMAL HIGH (ref 0.00–0.07)
Basophils Absolute: 0 10*3/uL (ref 0.0–0.1)
Basophils Relative: 0 %
Eosinophils Absolute: 0 10*3/uL (ref 0.0–0.5)
Eosinophils Relative: 0 %
HCT: 43.8 % (ref 36.0–46.0)
Hemoglobin: 14.8 g/dL (ref 12.0–15.0)
Immature Granulocytes: 1 %
Lymphocytes Relative: 10 %
Lymphs Abs: 0.9 10*3/uL (ref 0.7–4.0)
MCH: 29.8 pg (ref 26.0–34.0)
MCHC: 33.8 g/dL (ref 30.0–36.0)
MCV: 88.3 fL (ref 80.0–100.0)
Monocytes Absolute: 0.1 10*3/uL (ref 0.1–1.0)
Monocytes Relative: 1 %
Neutro Abs: 8.7 10*3/uL — ABNORMAL HIGH (ref 1.7–7.7)
Neutrophils Relative %: 88 %
Platelets: 339 10*3/uL (ref 150–400)
RBC: 4.96 MIL/uL (ref 3.87–5.11)
RDW: 14.4 % (ref 11.5–15.5)
WBC: 9.8 10*3/uL (ref 4.0–10.5)
nRBC: 0 % (ref 0.0–0.2)

## 2023-10-02 LAB — VITAMIN B12: Vitamin B-12: 786 pg/mL (ref 180–914)

## 2023-10-02 LAB — MAGNESIUM: Magnesium: 2 mg/dL (ref 1.7–2.4)

## 2023-10-02 MED ORDER — ORAL CARE MOUTH RINSE: 15.0000 mL | OROMUCOSAL | Status: DC | PRN

## 2023-10-02 MED ORDER — ADULT MULTIVITAMIN W/MINERALS CH
1.0000 | ORAL_TABLET | Freq: Every day | ORAL | Status: DC
  Administered 2023-10-03 – 2023-10-13 (×9): 1 via ORAL
  Filled 2023-10-02 (×11): qty 1

## 2023-10-02 MED ORDER — ENSURE ENLIVE PO LIQD
237.0000 mL | Freq: Three times a day (TID) | ORAL | Status: DC
Start: 2023-10-02 — End: 2023-10-11
  Administered 2023-10-02 – 2023-10-11 (×16): 237 mL via ORAL

## 2023-10-02 MED ORDER — OXYCODONE HCL 5 MG PO TABS
5.0000 mg | ORAL_TABLET | ORAL | Status: DC | PRN
  Administered 2023-10-02 – 2023-10-05 (×2): 5 mg via ORAL
  Filled 2023-10-02 (×2): qty 1

## 2023-10-02 NOTE — Progress Notes (Signed)
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  Presented to the hospital with complaints of difficulty speaking and confusion. Also ongoing complaint of right hand pain and headache. Medical oncology, radiation oncology, orthopedics as well as neurosurgery have been consulted about the patient. Assessment and Plan: Acute encephalopathy, metabolic Secondary to brain metastasis. MRI brain shows increase in the tumor size but midline shift is 8 mm, was 9 mm before). Will check EEG. No acute infarct.  No focal deficit at the time of my evaluation. Will also order basic metabolic workup and monitor.  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 Also with lymphadenopathy. Appreciate hematology consultation. Patient has seen [name] in the clinic who will guide systemic chemotherapy options but first wanted to establish goals of care and outpatient support for the patient. Radiation oncology was consulted as well who will see the patient.  Currently considering inpatient radiation as the patient has failed for outpatient follow-up.  Goals of care conversation. Palliative care is consulted for clarity and goal of care. Patient has a niece who is listed as her emergency contact. Patient also has a son who is not listed as the emergency contact, and per niece patient does not want her son to know about what is going on. Chaplain consult placed for [facility] documentation as well. Given metastatic cancer causing already poor prognosis, would recommend considering DNR for the patient.  Brain metastasis. Patient has a  solitary brain metastasis which is increasing in size. With significant surrounding vasogenic edema.  And compression with mass effect. Midline shift of 8 mm for now. I discussed with Dr. Conchita Paris.  Who recommended to add Dr. Creta Levin who has seen the patient during her last admission to the treatment team and plan will be to discuss the patient at tumor board tomorrow for further discussion about intervention option.  Most likely palliative radiation  Right humerus fracture.  Pathological Currently in sling. Nonweightbearing. Discussed with Dr. Christell Constant who will see the patient. Recommendation is that the patient needs clarity on a goal of care as well as clarity and of plan of neurosurgical intervention before he would proceed with humerus surgery.  Reported history of hypertension. Blood pressure is actually stable.  No indication for any medication right now.  Lymphadenopathy. Right supraclavicular right paratracheal, bilateral hilar subcarinal and infrahilar lymphadenopathy seen. Monitor for now.  Emphysema. Currently no evidence of COPD exacerbation. For now we will monitor.  Interstitial lung disease. CT scan on 1/11 shows severe honeycombing of the lung. While at present she does not appear to have any exacerbation, if she has ILD that would render poor prognosis as well. Currently on steroids which should help with breathing.  Underweight. Body mass index is 17.08 kg/m.  Placing the patient at high risk of poor outcome. 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 Will initiate supplements.  Subjective: Expressive aphasia secondary to mass effect.  Able to ambulate, able to follow commands, no confusion noted at the time of my evaluation.  Denies any chest pain or abdominal pain.  No nausea no vomiting.  Headache resolved.  Physical Exam: General: in Mild distress, No Rash Cardiovascular: S1 and S2 Present, No Murmur Respiratory: Good respiratory effort, Bilateral Air entry  present.  No Crackles, No wheezes Abdomen: Bowel Sound present, No tenderness Extremities: No edema Neuro: Alert and aphasic and therefore have difficulty answering orientation question.  Wants me to talk to her niece.  Able to follow commands.  No asterixis. Bilateral equal strength in lower extremity.  Data Reviewed: I have Reviewed nursing notes, Vitals, and Lab results. Since last encounter, pertinent lab results CBC and BMP   . I have ordered test including CBC BMP B12 B1 folic acid TSH  . I have discussed pt's care plan and test results with neurosurgery Dr. Conchita Paris, radiation oncology, orthopedics, hematology  .   Disposition: Status is: Inpatient Remains inpatient appropriate because: Need radiation  SCDs Start: 10/01/23 2234   Family Communication: Discussed with niece on the phone Level of care: Progressive   Vitals:   10/02/23 0300 10/02/23 0645 10/02/23 0800 10/02/23 1100  BP: 123/76 132/75 136/83 138/71  Pulse: 70 73 86 71  Resp: 13 19 20 17   Temp:  97.9 F (36.6 C) 97.6 F (36.4 C) 97.9 F (36.6 C)  TempSrc:  Oral Oral Oral  SpO2: 97% 97% 98% 100%  Weight:      Height:       The patient is critically ill with multiple organ systems failure and requires high complexity decision making for assessment and support, frequent evaluation and titration of therapies. Critical Care Time devoted to patient care services described in this note is 35 minutes   Author: Lynden Oxford, MD 10/02/2023 11:28 AM  Please look on [email or web address] to find out who is on call.

## 2023-10-02 NOTE — Consult Note (Signed)
[facility] CONSULT NOTE  Patient Care Team: [name], NP as PCP - General (Internal Medicine)  CHIEF COMPLAINTS/PURPOSE OF CONSULTATION:  Metastatic lung cancer  HISTORY OF PRESENTING ILLNESS:  Ms. Regina [name] is a 58 year old patient of [name] who was originally seen in consultation when she presented to the hospital on September 17, 2023 with aphasia and right-sided weakness.  At that time CT of the head showed a 2 cm left frontal lobe mass with vasogenic edema.  CT chest showed a 2.9 cm right upper lobe mass.  There was pathologic lymphadenopathy and supraclavicular, paratracheal hilar subcarinal lymph nodes.  Brain MRI confirmed a 2.8 cm enhancing mass.  Right humerus had a fracture and biopsy of the right upper extremity soft tissue mass came back metastatic adenocarcinoma consistent with lung primary. [name] ordered PET CT scan and radiation oncology follow-up.  She missed her PET/CT appointment and it appears that she missed radiation oncology appointments as well.  She came back to the emergency room because of aphasia.  She answers questions with yes or no and points to the telephone to call her niece for any further information.  MRI brain showed extensive edema with the increased mass size to 3.2 cm.  She was started on IV Decadron.  She is awake and alert and pleasant but not oriented to time place or person. She tells me that she has no other family members like a spouse or children.  Based on my conversation it appears that the power of attorney is her niece.  I called her niece and she did not answer the phone so we left a message.   I reviewed her records extensively and collaborated the history with the patient.  SUMMARY OF ONCOLOGIC HISTORY: Oncology History  Primary adenocarcinoma of upper lobe of right lung (HCC)  09/22/2023 Initial Diagnosis   Primary adenocarcinoma of upper lobe of right lung (HCC)   09/22/2023 Cancer Staging   Staging form:  Lung, AJCC V9 - Clinical stage from 09/22/2023: Stage IVB (cT1c, cN3, cM1c2) - Signed by Si Gaul, MD on 09/22/2023 Stage prefix: Initial diagnosis Method of lymph node assessment: Clinical      MEDICAL HISTORY:  Past Medical History:  Diagnosis Date   Allergy    Anxiety    Brain mass    COPD (chronic obstructive pulmonary disease) (HCC)    Emphysema of lung (HCC)    Hypertension     SURGICAL HISTORY: Past Surgical History:  Procedure Laterality Date   dental procedure      SOCIAL HISTORY: Social History   Socioeconomic History   Marital status: Widowed    Spouse name: Not on file   Number of children: 1    Years of education: 21    Highest education level: Not on file  Occupational History   Occupation: Set designer: [employer]    Comment: Nurse, mental health   Tobacco Use   Smoking status: Former    Current packs/day: 0.00    Types: Cigarettes    Quit date: 10/31/2019    Years since quitting: 3.9   Smokeless tobacco: Never  Vaping Use   Vaping status: Never Used  Substance and Sexual Activity   Alcohol use: Yes    Alcohol/week: 6.0 standard drinks of alcohol    Types: 6 Cans of beer per week   Drug use: Yes    Frequency: 4.0 times per week    Types: Marijuana   Sexual activity:  Not Currently  Other Topics Concern   Not on file  Social History Narrative   Lives with her brother.    Son lives near by, 5 yo, married.    Social Drivers of Corporate investment banker Strain: Not on file  Food Insecurity: No Food Insecurity (09/21/2023)   Hunger Vital Sign    Worried About Running Out of Food in the Last Year: Never true    Ran Out of Food in the Last Year: Never true  Transportation Needs: No Transportation Needs (09/21/2023)   PRAPARE - Administrator, Civil Service (Medical): No    Lack of Transportation (Non-Medical): No  Physical Activity: Not on file  Stress: Not on file  Social Connections: Not on file   Intimate Partner Violence: Not At Risk (09/21/2023)   Humiliation, Afraid, Rape, and Kick questionnaire    Fear of Current or Ex-Partner: No    Emotionally Abused: No    Physically Abused: No    Sexually Abused: No    FAMILY HISTORY: Family History  Problem Relation Age of Onset   Leukemia Mother    Hypertension Mother    Hypertension Father    Cancer Neg Hx    Diabetes Neg Hx    Heart disease Neg Hx    Colon cancer Neg Hx    Esophageal cancer Neg Hx    Rectal cancer Neg Hx    Stomach cancer Neg Hx     ALLERGIES:  is allergic to pork allergy and bee venom.  MEDICATIONS:  Current Facility-Administered Medications  Medication Dose Route Frequency Provider Last Rate Last Admin   acetaminophen (TYLENOL) tablet 650 mg  650 mg Oral Q6H PRN Gery Pray, MD   650 mg at 10/02/23 0128   Or   acetaminophen (TYLENOL) suppository 650 mg  650 mg Rectal Q6H PRN Crosley, Debby, MD       dexamethasone (DECADRON) injection 4 mg  4 mg Intravenous Q6H Crosley, Debby, MD   4 mg at 10/02/23 0759   levETIRAcetam (KEPPRA) IVPB 500 mg/100 mL premix  500 mg Intravenous BID Gery Pray, MD   Stopped at 10/02/23 0121   ondansetron (ZOFRAN) tablet 4 mg  4 mg Oral Q6H PRN Gery Pray, MD       Or   ondansetron (ZOFRAN) injection 4 mg  4 mg Intravenous Q6H PRN Crosley, Debby, MD       oxyCODONE (Oxy IR/ROXICODONE) immediate release tablet 5 mg  5 mg Oral Q4H PRN Gery Pray, MD   5 mg at 10/02/23 0235   Current Outpatient Medications  Medication Sig Dispense Refill   acetaminophen (TYLENOL) 325 MG tablet Take 2 tablets (650 mg total) by mouth every 6 (six) hours as needed for mild pain (pain score 1-3) (or Fever >/= 101). 30 tablet 0   levETIRAcetam (KEPPRA) 500 MG tablet Take 1 tablet (500 mg total) by mouth 2 (two) times daily. 60 tablet 0   oxyCODONE (ROXICODONE) 5 MG immediate release tablet Take 1 tablet (5 mg total) by mouth every 4 (four) hours as needed for severe pain (pain score  7-10). 30 tablet 0    REVIEW OF SYSTEMS:   Patient is not able to answer beyond yes or no. She has generalized weakness and specifically right-sided weakness.   All other systems were reviewed with the patient and are negative.  PHYSICAL EXAMINATION: ECOG PERFORMANCE STATUS: 3 - Symptomatic, >50% confined to bed  Vitals:   10/02/23 0645 10/02/23 0800  BP:  132/75 136/83  Pulse: 73 86  Resp: 19 20  Temp: 97.9 F (36.6 C) 97.6 F (36.4 C)  SpO2: 97% 98%   Filed Weights   10/01/23 1351  Weight: 105 lb 13.1 oz (48 kg)    GENERAL:alert, no distress and comfortable    LABORATORY DATA:  I have reviewed the data as listed Lab Results  Component Value Date   WBC 9.8 10/02/2023   HGB 14.8 10/02/2023   HCT 43.8 10/02/2023   MCV 88.3 10/02/2023   PLT 339 10/02/2023   Lab Results  Component Value Date   NA 134 (L) 10/02/2023   K 4.5 10/02/2023   CL 101 10/02/2023   CO2 25 10/02/2023    RADIOGRAPHIC STUDIES: I have personally reviewed the radiological reports and agreed with the findings in the report.  ASSESSMENT AND PLAN:   Metastatic poorly differentiated lung adenocarcinoma with bone and brain metastases.  We need to establish goals of care as well as connect with the patient's niece to figure out if they would be interested in any treatment options. Brain metastases with aphasia and profound cerebral edema: Decadron IV being given.  Radiation oncology will need to be consulted. [name] will guide systemic treatment options if she was interested and compliant.  Based on her missing appointments and not following up, it is very concerning that systemic treatments could be detrimental and hospice may be a reasonable option. Thank you much for consulting [place].   All questions were answered. The patient knows to call the clinic with any problems, questions or concerns.    Tamsen Meek, MD @T @

## 2023-10-02 NOTE — Progress Notes (Signed)
Unable to complete admission questions due to patient's orientation.

## 2023-10-02 NOTE — Progress Notes (Signed)
Contacted nurse regarding scheduling time for the EEG, but nurse stated pt will be transferring to [facility] and [facility] is in route to get her. Will reach out to provider to cancel order.

## 2023-10-02 NOTE — ED Notes (Signed)
[facility] called for transport to [facility]; once [facility] sees the bed is ready at [facility] they will send transport.

## 2023-10-02 NOTE — ED Notes (Addendum)
RN called to room as patient had removed telemetry monitoring. Patient reported that she had walked to the bathroom to urinate independently. Patient re-connected to monitor and re-oriented to place, time, and situation. Patient educated to utilize call bell the next time she needs to use the bathroom as a urine sample needs to be collected; patient verbalized understanding. Bed alarm placed and activated.

## 2023-10-03 ENCOUNTER — Ambulatory Visit
Admit: 2023-10-03 | Discharge: 2023-10-03 | Disposition: A | Discharge status: Home / Self Care | Payer: 59 | Attending: Radiation Oncology | Admitting: Radiation Oncology

## 2023-10-03 ENCOUNTER — Inpatient Hospital Stay: Payer: 59 | Admitting: Licensed Clinical Social Worker

## 2023-10-03 ENCOUNTER — Other Ambulatory Visit: Payer: Self-pay | Admitting: Internal Medicine

## 2023-10-03 ENCOUNTER — Other Ambulatory Visit: Payer: Self-pay | Admitting: Neurosurgery

## 2023-10-03 ENCOUNTER — Ambulatory Visit
Admit: 2023-10-03 | Discharge: 2023-10-03 | Disposition: A | Discharge status: Home / Self Care | Payer: 59 | Attending: Urology | Admitting: Urology

## 2023-10-03 DIAGNOSIS — C7931 Secondary malignant neoplasm of brain: Secondary | ICD-10-CM | POA: Diagnosis not present

## 2023-10-03 DIAGNOSIS — C7951 Secondary malignant neoplasm of bone: Secondary | ICD-10-CM | POA: Insufficient documentation

## 2023-10-03 DIAGNOSIS — Z87891 Personal history of nicotine dependence: Secondary | ICD-10-CM | POA: Diagnosis not present

## 2023-10-03 DIAGNOSIS — C3490 Malignant neoplasm of unspecified part of unspecified bronchus or lung: Secondary | ICD-10-CM

## 2023-10-03 DIAGNOSIS — C3411 Malignant neoplasm of upper lobe, right bronchus or lung: Secondary | ICD-10-CM | POA: Diagnosis not present

## 2023-10-03 LAB — URINALYSIS, ROUTINE W REFLEX MICROSCOPIC
Bacteria, UA: NONE SEEN
Bilirubin Urine: NEGATIVE
Glucose, UA: NEGATIVE mg/dL
Ketones, ur: NEGATIVE mg/dL
Nitrite: NEGATIVE
Protein, ur: NEGATIVE mg/dL
Specific Gravity, Urine: 1.019 (ref 1.005–1.030)
pH: 6 (ref 5.0–8.0)

## 2023-10-03 LAB — BASIC METABOLIC PANEL
Anion gap: 12 (ref 5–15)
BUN: 21 mg/dL — ABNORMAL HIGH (ref 6–20)
CO2: 22 mmol/L (ref 22–32)
Calcium: 10.1 mg/dL (ref 8.9–10.3)
Chloride: 101 mmol/L (ref 98–111)
Creatinine, Ser: 0.45 mg/dL (ref 0.44–1.00)
GFR, Estimated: >60 mL/min (ref >60)
Glucose, Bld: 124 mg/dL — ABNORMAL HIGH (ref 70–99)
Potassium: 4.2 mmol/L (ref 3.5–5.1)
Sodium: 135 mmol/L (ref 135–145)

## 2023-10-03 LAB — CBC
HCT: 42.9 % (ref 36.0–46.0)
Hemoglobin: 14.5 g/dL (ref 12.0–15.0)
MCH: 29.8 pg (ref 26.0–34.0)
MCHC: 33.8 g/dL (ref 30.0–36.0)
MCV: 88.1 fL (ref 80.0–100.0)
Platelets: 428 10*3/uL — ABNORMAL HIGH (ref 150–400)
RBC: 4.87 MIL/uL (ref 3.87–5.11)
RDW: 14.3 % (ref 11.5–15.5)
WBC: 18.3 10*3/uL — ABNORMAL HIGH (ref 4.0–10.5)
nRBC: 0 % (ref 0.0–0.2)

## 2023-10-03 LAB — MAGNESIUM: Magnesium: 2.1 mg/dL (ref 1.7–2.4)

## 2023-10-03 MED ORDER — LEVETIRACETAM 500 MG PO TABS
500.0000 mg | ORAL_TABLET | Freq: Two times a day (BID) | ORAL | Status: DC
  Administered 2023-10-03 – 2023-10-13 (×19): 500 mg via ORAL
  Filled 2023-10-03 (×19): qty 1

## 2023-10-03 MED ORDER — AMLODIPINE BESYLATE 5 MG PO TABS
5.0000 mg | ORAL_TABLET | Freq: Every day | ORAL | Status: DC
  Administered 2023-10-03 – 2023-10-13 (×11): 5 mg via ORAL
  Filled 2023-10-03 (×5): qty 1
  Filled 2023-10-03: qty 2
  Filled 2023-10-03 (×5): qty 1

## 2023-10-03 NOTE — Progress Notes (Signed)
  Radiation Oncology         [phone]) [phone] ________________________________  Name: Regina [name] MRN: [id number]  Date: 10/03/2023  DOB: Jun 19, 1966  INPATIENT  SIMULATION AND TREATMENT PLANNING NOTE    ICD-10-CM   1. Metastasis to brain [facility])  C79.31       DIAGNOSIS:  58 yo woman with a solitary 3.2 cm left frontal brain metastasis from right upper lung cancer  NARRATIVE:  The patient was brought to the CT Simulation planning suite.  Identity was confirmed.  All relevant records and images related to the planned course of therapy were reviewed.  The patient freely provided informed written consent to proceed with treatment after reviewing the details related to the planned course of therapy. The consent form was witnessed and verified by the simulation staff. Intravenous access was established for contrast administration. Then, the patient was set-up in a stable reproducible supine position for radiation therapy.  A relocatable thermoplastic stereotactic head frame was fabricated for precise immobilization.  CT images were obtained.  Surface markings were placed.  The CT images were loaded into the planning software and fused with the patient's targeting MRI scan.  Then the target and avoidance structures were contoured.  Treatment planning then occurred.  The radiation prescription was entered and confirmed.  I have requested 3D planning  I have requested a DVH of the following structures: Brain stem, brain, left eye, right eye, lenses, optic chiasm, target volumes, uninvolved brain, and normal tissue.    SPECIAL TREATMENT PROCEDURE:  The planned course of therapy using radiation constitutes a special treatment procedure. Special care is required in the management of this patient for the following reasons. This treatment constitutes a Special Treatment Procedure for the following reason: High dose per fraction requiring special monitoring for increased toxicities of treatment including daily  imaging.  The special nature of the planned course of radiotherapy will require increased physician supervision and oversight to ensure patient's safety with optimal treatment outcomes.  This requires extended time and effort.  PLAN:  The patient will receive 24 Gy in 3 fractions to be followed by resection.  ________________________________  Artist Pais Kathrynn Running, M.D.

## 2023-10-03 NOTE — Assessment & Plan Note (Signed)
-   Consult radiation therapy, to start tomorrow

## 2023-10-03 NOTE — Hospital Course (Addendum)
58 y.o. F with hx smoking, COPD, HTN, and anxiety and recently diagnosed few weeks ago with lung cancer metastatic to brain, LN and bone (c/b pathological right humerus fracture) who was admitted due to failure to care for self at home.  Recently admitted 1/11 to 1/14, found to have lung mass, brain mass and pathologic fracture due to right humerus met --> biopsy showed NSCLC.  Saw Oncology after discharge, discussed treatment options and was referred for PET and Radiation oncology appointments.  Subsequently missed all appointments, and then showed up at the hospital, aphasic and appearing confused.

## 2023-10-03 NOTE — Assessment & Plan Note (Addendum)
Cerebral edema Metastasis to the brain - Continue Keppra and dexamethasone - Consult radiation oncology and neurosurgery

## 2023-10-03 NOTE — Consult Note (Signed)
Consultation Note Date: 10/03/2023   Patient Name: Regina [name]  DOB: 1966/03/17  MRN: [id number]  Age / Sex: 58 y.o., female  PCP: [name], NP Referring Physician: Alberteen Sam, *  Reason for Consultation: Establishing goals of care  HPI/Patient Profile: 58 y.o. female  admitted on 10/01/2023   Clinical Assessment and Goals of Care: 58 year old lady with a life limiting illness of the lung cancer, left frontal brain metastases.  Seen and evaluated by medical oncology and radiation oncology.  Plan is for the patient to be set up for [facility] CT simulation and then to start SRS, followed by surgical resection. Palliative consult for additional support has been requested. Chart reviewed, patient seen and examined.  Patient appears confused, patient has aphasia.  Patient just arrived back from radiation oncology evaluation back to her room.  I explained and described palliative services.  Patient listened intently and then replied back -" I do not need to hear that"  Call placed and was able to reach niece Ms. Crawford.  Explained to the patient's niece about the role and scope of palliative services following along.  Answered all of her questions to the best of my ability. Palliative medicine is specialized medical care for people living with serious illness. It focuses on providing relief from the symptoms and stress of a serious illness. The goal is to improve quality of life for both the patient and the family. Goals of care: Broad aims of medical therapy in relation to the patient's values and preferences. Our aim is to provide medical care aimed at enabling patients to achieve the goals that matter most to them, given the circumstances of their particular medical situation and their constraints.    NEXT OF KIN Niece Ms. Okey Dupre also has a son.  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  SUMMARY OF RECOMMENDATIONS   Full code full scope care for now Monitor how the patient does with radiation, niece Ms Okey Dupre is the primary contact, patient has son Yarielis Funaro [phone]. Continue current hospitalization, palliative services will follow along for further goals of care discussions. Thank you for the consult.  Code Status/Advance Care Planning: Full code   Symptom Management:     Palliative Prophylaxis:  Delirium Protocol  Additional Recommendations (Limitations, Scope, Preferences): Full Scope Treatment  Psycho-social/Spiritual:  Desire for further Chaplaincy support:yes Additional Recommendations: Caregiving  Support/Resources  Prognosis:  Unable to determine  Discharge Planning: To Be Determined      Primary Diagnoses: Present on Admission:  Anxiety  COPD (chronic obstructive pulmonary disease) (HCC)  HTN (hypertension)  Depression  Neoplasm causing mass effect and brain compression on adjacent structures (HCC)  Primary adenocarcinoma of upper lobe of right lung (HCC)  Cerebral edema (HCC)  Metastasis to brain [facility])   I have reviewed the medical record, interviewed the patient and family, and examined the patient. The following aspects are pertinent.  Past Medical History:  Diagnosis Date   Allergy    Anxiety  Brain mass    COPD (chronic obstructive pulmonary disease) (HCC)    Emphysema of lung (HCC)    Hypertension    Social History   Socioeconomic History   Marital status: Widowed    Spouse name: Not on file   Number of children: 1    Years of education: 31    Highest education level: Not on file  Occupational History   Occupation: Set designer: [employer]    Comment: Nurse, mental health   Tobacco Use   Smoking status: Former    Current packs/day: 0.00    Types: Cigarettes    Quit date: 10/31/2019    Years since quitting: 3.9   Smokeless tobacco:  Never  Vaping Use   Vaping status: Never Used  Substance and Sexual Activity   Alcohol use: Yes    Alcohol/week: 6.0 standard drinks of alcohol    Types: 6 Cans of beer per week   Drug use: Yes    Frequency: 4.0 times per week    Types: Marijuana   Sexual activity: Not Currently  Other Topics Concern   Not on file  Social History Narrative   Lives with her brother.    Son lives near by, 92 yo, married.    Social Drivers of Corporate investment banker Strain: Not on file  Food Insecurity: No Food Insecurity (09/21/2023)   Hunger Vital Sign    Worried About Running Out of Food in the Last Year: Never true    Ran Out of Food in the Last Year: Never true  Transportation Needs: No Transportation Needs (09/21/2023)   PRAPARE - Administrator, Civil Service (Medical): No    Lack of Transportation (Non-Medical): No  Physical Activity: Not on file  Stress: Not on file  Social Connections: Not on file   Family History  Problem Relation Age of Onset   Leukemia Mother    Hypertension Mother    Hypertension Father    Cancer Neg Hx    Diabetes Neg Hx    Heart disease Neg Hx    Colon cancer Neg Hx    Esophageal cancer Neg Hx    Rectal cancer Neg Hx    Stomach cancer Neg Hx    Scheduled Meds:  dexamethasone (DECADRON) injection  4 mg Intravenous Q6H   feeding supplement  237 mL Oral TID BM   levETIRAcetam  500 mg Oral BID   multivitamin with minerals  1 tablet Oral Daily   Continuous Infusions: PRN Meds:.acetaminophen **OR** acetaminophen, ondansetron **OR** ondansetron (ZOFRAN) IV, mouth rinse, oxyCODONE Medications Prior to Admission:  Prior to Admission medications   Medication Sig Start Date End Date Taking? Authorizing Provider  acetaminophen (TYLENOL) 325 MG tablet Take 2 tablets (650 mg total) by mouth every 6 (six) hours as needed for mild pain (pain score 1-3) (or Fever >/= 101). 09/20/23  Yes Azucena Fallen, MD  levETIRAcetam (KEPPRA) 500 MG tablet  Take 1 tablet (500 mg total) by mouth 2 (two) times daily. 09/20/23  Yes Azucena Fallen, MD  oxyCODONE (ROXICODONE) 5 MG immediate release tablet Take 1 tablet (5 mg total) by mouth every 4 (four) hours as needed for severe pain (pain score 7-10). 09/20/23  Yes Azucena Fallen, MD   Allergies  Allergen Reactions   Pork Allergy Nausea And Vomiting   Bee Venom Hives   Review of Systems Confused Physical Exam Sitting up in bed Has aphasia Attempts to answer a  few questions Regular work of breathing Thin, some degree of cachexia evident  Vital Signs: BP (!) 153/89 (BP Location: Left Arm)   Pulse 66   Temp 98.3 F (36.8 C)   Resp 16   Ht 5\' 6"  (1.676 m)   Wt 48 kg   LMP 06/06/2010   SpO2 100%   BMI 17.08 kg/m  Pain Scale: 0-10   Pain Score: 0-No pain   SpO2: SpO2: 100 % O2 Device:SpO2: 100 % O2 Flow Rate: .   IO: Intake/output summary:  Intake/Output Summary (Last 24 hours) at 10/03/2023 1257 Last data filed at 10/03/2023 0515 Gross per 24 hour  Intake 340 ml  Output 1050 ml  Net -710 ml    LBM: Last BM Date :  (t) Baseline Weight: Weight: 48 kg Most recent weight: Weight: 48 kg     Palliative Assessment/Data:   Palliative performance scale 50%.  Time In: 11.40 Time Out: 12.40 Time Total: 60 Greater than 50%  of this time was spent counseling and coordinating care related to the above assessment and plan.  Signed by: Rosalin Hawking, MD   Please contact Palliative Medicine Team phone at [phone] for questions and concerns.  For individual provider: See Loretha Stapler

## 2023-10-03 NOTE — Progress Notes (Signed)
  Progress Note   Patient: Regina [name] WUJ:[id number] DOB: [birth year] DOA: 10/01/2023     2 DOS: the patient was seen and examined on 10/03/2023       Brief hospital course: 58 y.o. F with hx smoking, COPD, HTN, and anxiety and recently diagnosed few weeks ago with lung cancer metastatic to brain, LN and bone (c/b pathological right humerus fracture) who was admitted due to failure to care for self at home.  Recently admitted 1/11 to 1/14 and underwent biopsy.  Saw Oncology after discharge, discussed treatment options and was referred for PET and Radiation oncology appointments, which she and niece missed.    Finally returned to hospital due to failure to thrive.     Assessment and Plan: * Primary adenocarcinoma of upper lobe of right lung (HCC) - Consult radiation therapy, to start tomorrow  Neoplasm causing mass effect and brain compression on adjacent structures (HCC) Cerebral edema Metastasis to the brain - Continue Keppra and dexamethasone - Consult radiation oncology and neurosurgery  COPD (chronic obstructive pulmonary disease) (HCC) No active flare, not on medication at home  HTN (hypertension) - Start amlodipine          Subjective: Patient speech is already improving, she is able to say more words, she denies pain in the arm, she has had no seizures.  No confusion.     Physical Exam: BP (!) 177/92   Pulse 96   Temp 98.5 F (36.9 C) (Oral)   Resp 16   Ht 5\' 6"  (1.676 m)   Wt 48 kg   LMP 06/06/2010   SpO2 99%   BMI 17.08 kg/m   Thin adult female, ambulating in the room, interactive, makes eye contact, attempts to explain things RRR, no murmurs, no peripheral edema Respiratory rate normal, lungs clear without rales or wheezes Abdomen soft without tenderness palpation or guarding and right arm in sling Attention normal, affect pleasant, judgment and insight appear impaired due to aphasia, however her speech is improving, and she is mostly having  certain word finding difficulties, and I think her reception seems normal, gait normal  Data Reviewed: Discussed with oncology MRI shows increased size of her mass  Family Communication: Niece by phone    Disposition: Status is: Inpatient         Author: Alberteen Sam, MD 10/03/2023 6:04 PM  For on call review [email or web address].

## 2023-10-03 NOTE — Progress Notes (Signed)
[facility] CSW Progress Note  Visual merchandiser  spoke w/ pt's niece over the phone.  Pt was hospitalized over the weekend due to increased confusion and aphasia.  Per niece over the weekend she did inform pt's son of her diagnosis.  Pt does not have a documented HCP and according to niece has never verbalized who she would want to make medical decisions on her behalf.  Pt's niece states she is willing to continue to speak w/ the medical team.  Pt's son would be the legal HCP w/ no formal documentation and pt's niece is happy to support him in decision making should this become necessary.  Pt's niece and son are actively discussing the best way to move forward w/ pt's brother's care as he is not able to manage his medications and prepare meals.  CSW to remain available as appropriate.        Rachel Moulds, LCSW Clinical Social Worker [facility]    Patient is participating in a Managed Medicaid Plan:  Yes

## 2023-10-03 NOTE — Plan of Care (Signed)
Problem: Clinical Measurements: Goal: Diagnostic test results will improve Outcome: Progressing Goal: Respiratory complications will improve Outcome: Progressing Goal: Cardiovascular complication will be avoided Outcome: Progressing   Problem: Coping: Goal: Level of anxiety will decrease Outcome: Progressing   Problem: Safety: Goal: Ability to remain free from injury will improve Outcome: Progressing

## 2023-10-03 NOTE — Progress Notes (Signed)
   10/03/23 1400  Spiritual Encounters  Type of Visit Follow up  Care provided to: Pt and family  Referral source Clinical staff  Reason for visit Advance directives  OnCall Visit No  Spiritual Framework  Presenting Themes Meaning/purpose/sources of inspiration;Coping tools   Chaplain was paged to come visit patient and her daughter to review and disucss AD paperwork.  Met pt and daughter at bedside - Patient declined to have it signed but did say that she did discuss it with her daughter in room.  The paperwork was pn side table opened and the daughter confirmed that it was reviewed.  Patient stated she was not ready to have it singed at this time. Ended visit with a departing blessing.     Will remain available for any further assistance.    Rev. Talbert Nan

## 2023-10-03 NOTE — Assessment & Plan Note (Deleted)
See above

## 2023-10-03 NOTE — Evaluation (Signed)
Physical Therapy Evaluation Only  Patient Details Name: Mirna Sutcliffe MRN: [id number] DOB: [birth year] Today's Date: 10/03/2023  History of Present Illness  58 yo female who was admitted on 10/01/2023 due to difficulty speaking. Pt was hospitalized 1/11-1/15/2025 secondary to metastatic lung ca to brain and R UE weakness with pathologic humerus fx. Pt was set up for OP PET, radiation and orthopedic and pt did not arrive for any appointments. Pt PMH includes but is not limited to: allergy, anxiety, brain mass, COPD, emphysema, HTN, and adenocarcinoma of lung.  Clinical Impression     Patient evaluated by Physical Therapy with no further acute PT needs identified. All education has been completed and the patient has no further questions.  See below for any follow-up Physical Therapy or DME needs. PT is signing off. Thank you for this referral.        If plan is discharge home, recommend the following: Supervision due to cognitive status;Assist for transportation;Direct supervision/assist for medications management   Can travel by private vehicle        Equipment Recommendations None recommended by PT  Recommendations for Other Services       Functional Status Assessment Patient has had a recent decline in their functional status and demonstrates the ability to make significant improvements in function in a reasonable and predictable amount of time.     Precautions / Restrictions Precautions Precautions: Other (comment) Precaution Comments: aphasia, decreased cog Required Braces or Orthoses: Sling Restrictions Weight Bearing Restrictions Per Provider Order: Yes RUE Weight Bearing Per Provider Order: Non weight bearing Other Position/Activity Restrictions: patient with fracutre of RUE with pending possible surgical intervention per chart.      Mobility  Bed Mobility Overal bed mobility: Modified Independent             General bed mobility comments: pt seated EOB and reports  had just come back from the bathroom    Transfers Overall transfer level: Modified independent Equipment used: None Transfers: Sit to/from Stand Sit to Stand: Modified independent (Device/Increase time)           General transfer comment: increased time, no assist    Ambulation/Gait Ambulation/Gait assistance: Modified independent (Device/Increase time) Gait Distance (Feet): 500 Feet Assistive device: None Gait Pattern/deviations: WFL(Within Functional Limits) Gait velocity: WFL     General Gait Details: no AD and no cues for gait pattern nor safety  Stairs            Wheelchair Mobility     Tilt Bed    Modified Rankin (Stroke Patients Only)       Balance Overall balance assessment: No apparent balance deficits (not formally assessed)                                           Pertinent Vitals/Pain Pain Assessment Pain Assessment: Faces Faces Pain Scale: Hurts little more Pain Location: RUE Pain Descriptors / Indicators: Discomfort, Grimacing Pain Intervention(s): Limited activity within patient's tolerance, Monitored during session, Repositioned    Home Living Family/patient expects to be discharged to:: Private residence Living Arrangements: Other (Comment) (brother and niece) Available Help at Discharge: Family;Available PRN/intermittently Type of Home: Apartment Home Access: Level entry       Home Layout: One level Home Equipment: None Additional Comments: 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reported having niece who helps with brother and her IADLs since last hospitalization  Prior Function Prior Level of Function : Independent/Modified Independent;Working/employed;Driving             Mobility Comments: no AD ADLs Comments: works for [employer], 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     Extremity/Trunk Assessment   Upper Extremity Assessment Upper Extremity Assessment: RUE deficits/detail RUE Deficits /  Details: pt with humerus fx and in sling RUE Coordination: decreased fine motor;decreased gross motor    Lower Extremity Assessment Lower Extremity Assessment: Overall WFL for tasks assessed    Cervical / Trunk Assessment Cervical / Trunk Assessment: Normal  Communication   Communication Communication: Difficulty following commands/understanding;Difficulty communicating thoughts/reduced clarity of speech Following commands: Follows one step commands consistently;Follows one step commands with increased time Cueing Techniques: Verbal cues;Gestural cues;Visual cues  Cognition Arousal: Alert Behavior During Therapy: Flat affect Overall Cognitive Status: Difficult to assess                                 General Comments: patient frustracted quickly with word finding difficulties.        General Comments      Exercises     Assessment/Plan    PT Assessment Patient needs continued PT services  PT Problem List Decreased cognition;Decreased safety awareness;Decreased mobility       PT Treatment Interventions Functional mobility training;Balance training;Patient/family education;Therapeutic activities;Gait training;Stair training;Cognitive remediation;Therapeutic exercise    PT Goals (Current goals can be found in the Care Plan section)  Acute Rehab PT Goals Patient Stated Goal: to go home PT Goal Formulation: With patient/family Time For Goal Achievement: 10/17/23 Potential to Achieve Goals: Good    Frequency Min 1X/week     Co-evaluation PT/OT/SLP Co-Evaluation/Treatment: Yes Reason for Co-Treatment: Necessary to address cognition/behavior during functional activity PT goals addressed during session: Mobility/safety with mobility OT goals addressed during session: ADL's and self-care       AM-PAC PT "6 Clicks" Mobility  Outcome Measure Help needed turning from your back to your side while in a flat bed without using bedrails?: None Help needed  moving from lying on your back to sitting on the side of a flat bed without using bedrails?: None Help needed moving to and from a bed to a chair (including a wheelchair)?: None Help needed standing up from a chair using your arms (e.g., wheelchair or bedside chair)?: None Help needed to walk in hospital room?: None Help needed climbing 3-5 steps with a railing? : A Little 6 Click Score: 23    End of Session Equipment Utilized During Treatment: Other (comment);Gait belt (RUE sling) Activity Tolerance: Patient tolerated treatment well Patient left: in bed;with call bell/phone within reach Nurse Communication: Mobility status PT Visit Diagnosis: Difficulty in walking, not elsewhere classified (R26.2)    Time: 1056-1110 PT Time Calculation (min) (ACUTE ONLY): 14 min   Charges:   PT Evaluation $PT Eval Low Complexity: 1 Low   PT General Charges $$ ACUTE PT VISIT: 1 Visit         Johnny Bridge, PT Acute Rehab   Jacqualyn Posey 10/03/2023, 2:14 PM

## 2023-10-03 NOTE — Progress Notes (Signed)
Patient has returned to unit from radiation. Denies pain or nausea at this time.

## 2023-10-03 NOTE — Assessment & Plan Note (Signed)
No active flare, not on medication at home

## 2023-10-03 NOTE — Progress Notes (Addendum)
[facility]         [phone] ________________________________  Initial inpatient Consultation  Name: Regina [name] MRN: [id number]  Date: 10/01/2023  DOB: Nov 06, 1965  REFERRING PHYSICIAN: No ref. provider found  DIAGNOSIS: 58 yo woman with a solitary 3.2 cm left frontal brain metastasis from right upper lung cancer     ICD-10-CM   1. Vasogenic brain edema (HCC)  G93.6     2. Lung cancer metastatic to brain (HCC)  C34.90    C79.31     3. 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 female who presented to the hospital on September 17, 2023 with aphasia and right-sided weakness.  At that time CT of the head showed a 2 cm left frontal lobe mass with vasogenic edema.  CT chest showed a 2.9 cm right upper lobe mass.  There was pathologic lymphadenopathy and supraclavicular, paratracheal hilar subcarinal lymph nodes.  Brain MRI confirmed a 2.8 cm enhancing mass.  Right humerus had a fracture and biopsy of the right upper extremity soft tissue mass came back metastatic adenocarcinoma consistent with lung primary.  [name] ordered PET CT scan and radiation oncology follow-up.  She missed her PET/CT appointment and it appears that she missed radiation oncology appointments as well.  She came back to the emergency room because of aphasia.  Prior to starting steroides, she answered questions with yes or no and points to the telephone to call her niece for any further information.  MRI brain showed extensive edema with the increased mass size to 3.2 cm.      She was started on IV Decadron.  She is awake and alert and now that she has been taking steroids for 24 hours she is pleasant and oriented to time place and person.  However, her aphasia is limiting.  She also has a right humerous pathologic fracture:        PREVIOUS RADIATION  THERAPY: No  Past Medical History:  Diagnosis Date   Allergy    Anxiety    Brain mass    COPD (chronic obstructive pulmonary disease) (HCC)    Emphysema of lung (HCC)    Hypertension   :   Past Surgical History:  Procedure Laterality Date   dental procedure    :   Current Facility-Administered Medications:    acetaminophen (TYLENOL) tablet 650 mg, 650 mg, Oral, Q6H PRN, 650 mg at 10/02/23 0128 **OR** acetaminophen (TYLENOL) suppository 650 mg, 650 mg, Rectal, Q6H PRN, Crosley, Debby, MD   dexamethasone (DECADRON) injection 4 mg, 4 mg, Intravenous, Q6H, Crosley, Debby, MD, 4 mg at 10/03/23 0606   feeding supplement (ENSURE ENLIVE / ENSURE PLUS) liquid 237 mL, 237 mL, Oral, TID BM, Rolly Salter, MD, 237 mL at 10/02/23 2126   levETIRAcetam (KEPPRA) IVPB 500 mg/100 mL premix, 500 mg, Intravenous, BID, Crosley, Debby, MD, Last Rate: 400 mL/hr at 10/02/23 2129, 500 mg at 10/02/23 2129   multivitamin with minerals tablet 1 tablet, 1 tablet, Oral, Daily, Rolly Salter, MD   ondansetron (ZOFRAN) tablet 4 mg, 4 mg, Oral, Q6H PRN **OR** ondansetron (ZOFRAN) injection 4 mg, 4 mg, Intravenous, Q6H PRN, Crosley, Debby, MD   Oral care mouth rinse, 15 mL, Mouth Rinse, PRN, Rolly Salter, MD   oxyCODONE (Oxy IR/ROXICODONE) immediate release tablet 5 mg, 5 mg, Oral, Q4H PRN, Crosley, Debby,  MD, 5 mg at 10/02/23 0235:   Allergies  Allergen Reactions   Pork Allergy Nausea And Vomiting   Bee Venom Hives  :   Family History  Problem Relation Age of Onset   Leukemia Mother    Hypertension Mother    Hypertension Father    Cancer Neg Hx    Diabetes Neg Hx    Heart disease Neg Hx    Colon cancer Neg Hx    Esophageal cancer Neg Hx    Rectal cancer Neg Hx    Stomach cancer Neg Hx   :   Social History   Socioeconomic History   Marital status: Widowed    Spouse name: Not on file   Number of children: 1    Years of education: 50    Highest education level: Not on file   Occupational History   Occupation: Set designer: [employer]    Comment: Nurse, mental health   Tobacco Use   Smoking status: Former    Current packs/day: 0.00    Types: Cigarettes    Quit date: 10/31/2019    Years since quitting: 3.9   Smokeless tobacco: Never  Vaping Use   Vaping status: Never Used  Substance and Sexual Activity   Alcohol use: Yes    Alcohol/week: 6.0 standard drinks of alcohol    Types: 6 Cans of beer per week   Drug use: Yes    Frequency: 4.0 times per week    Types: Marijuana   Sexual activity: Not Currently  Other Topics Concern   Not on file  Social History Narrative   Lives with her brother.    Son lives near by, 23 yo, married.    Social Drivers of Corporate investment banker Strain: Not on file  Food Insecurity: No Food Insecurity (09/21/2023)   Hunger Vital Sign    Worried About Running Out of Food in the Last Year: Never true    Ran Out of Food in the Last Year: Never true  Transportation Needs: No Transportation Needs (09/21/2023)   PRAPARE - Administrator, Civil Service (Medical): No    Lack of Transportation (Non-Medical): No  Physical Activity: Not on file  Stress: Not on file  Social Connections: Not on file  Intimate Partner Violence: Not At Risk (09/21/2023)   Humiliation, Afraid, Rape, and Kick questionnaire    Fear of Current or Ex-Partner: No    Emotionally Abused: No    Physically Abused: No    Sexually Abused: No  :  REVIEW OF SYSTEMS:  A 15 point review of systems is documented in the electronic medical record. This was obtained by the nursing staff. However, I reviewed this with the patient to discuss relevant findings and make appropriate changes.  Pertinent items are noted in HPI.   PHYSICAL EXAM:  Blood pressure (!) 153/89, pulse 66, temperature 98.3 F (36.8 C), resp. rate 16, height 5\' 6"  (1.676 m), weight 105 lb 13.1 oz (48 kg), last menstrual period 06/06/2010, SpO2 100%. Frail,  aphasic, comfortable, responsive  KPS = 60  100 - Normal; no complaints; no evidence of disease. 90   - Able to carry on normal activity; minor signs or symptoms of disease. 80   - Normal activity with effort; some signs or symptoms of disease. 56   - Cares for self; unable to carry on normal activity or to do active work. 60   - Requires occasional assistance, but is able  to care for most of his personal needs. 50   - Requires considerable assistance and frequent medical care. 40   - Disabled; requires special care and assistance. 30   - Severely disabled; hospital admission is indicated although death not imminent. 20   - Very sick; hospital admission necessary; active supportive treatment necessary. 10   - Moribund; fatal processes progressing rapidly. 0     - Dead  Karnofsky DA, Abelmann WH, Craver LS and Burchenal JH [phone]) The use of the nitrogen mustards in the palliative treatment of carcinoma: with particular reference to bronchogenic carcinoma Cancer 1 634-56  LABORATORY DATA:  Lab Results  Component Value Date   WBC 18.3 (H) 10/03/2023   HGB 14.5 10/03/2023   HCT 42.9 10/03/2023   MCV 88.1 10/03/2023   PLT 428 (H) 10/03/2023   Lab Results  Component Value Date   NA 135 10/03/2023   K 4.2 10/03/2023   CL 101 10/03/2023   CO2 22 10/03/2023   Lab Results  Component Value Date   ALT 14 10/02/2023   AST 22 10/02/2023   ALKPHOS 91 10/02/2023   BILITOT 0.7 10/02/2023     RADIOGRAPHY: MR Brain W and Wo Contrast Result Date: 10/01/2023 CLINICAL DATA:  New onset right arm weakness and progressive aphasia. Metastatic lung cancer. EXAM: MRI HEAD WITHOUT AND WITH CONTRAST TECHNIQUE: Multiplanar, multiecho pulse sequences of the brain and surrounding structures were obtained without and with intravenous contrast. CONTRAST:  5mL GADAVIST GADOBUTROL 1 MMOL/ML IV SOLN COMPARISON:  MR head without and with contrast 09/17/2023 FINDINGS: Brain: The enhancing mass in the left frontal  lobe is increased in size over the last 14 days. It now measures 3.2 x 3.4 x 2.5 cm. Surrounding vasogenic edema in the left hemisphere has increased. Progressive mass effect and midline shift is present, now measuring 8 mm. No additional sites of enhancement are present. Periventricular and scattered subcortical T2 hyperintensities bilaterally are otherwise stable. Brainstem and cerebellum are unremarkable. Vascular: Flow is present in the major intracranial arteries. Skull and upper cervical spine: The globes and orbits are within normal limits. Sinuses/Orbits: Right mastoid effusion is present. No obstructing nasopharyngeal lesion is present. The paranasal sinuses are otherwise clear. The globes and orbits are within normal limits. IMPRESSION: 1. Interval increase in size of the enhancing metastatic lesion in the left frontal lobe over the last 14 days. It now measures 3.2 x 3.4 x 2.5 cm. 2. Progressive mass effect and midline shift, now measuring 8 mm. 3. No acute infarct. Lesion demonstrates restricted diffusion consistent with a high-grade tumor. 4. Right mastoid effusion. No obstructing nasopharyngeal lesion is present. Electronically Signed   By: Marin Roberts M.D.   On: 10/01/2023 19:51   DG Humerus Right Result Date: 10/01/2023 CLINICAL DATA:  [id number] Pain [id number].  Right humerus pain. EXAM: RIGHT HUMERUS - 2+ VIEW COMPARISON:  09/17/2023. FINDINGS: There is an approximately 6.6 cm long segment of the mid diaphysis of the right humerus which exhibits significant bone destruction/resorption with permeative pattern. Findings are compatible with pathological fracture. There is interval progression of bone destruction when compared to the prior exam. No other acute fracture or dislocation. No aggressive osseous lesion. Mild degenerative changes of imaged joints. No radiopaque foreign bodies. Soft tissues are within normal limits. IMPRESSION: *Interval progression of pathological fracture of mid  diaphysis of the right humerus. *Otherwise no acute fracture or dislocation. Electronically Signed   By: Jules Schick M.D.   On: 10/01/2023 18:41   DG  Chest Portable 1 View Result Date: 10/01/2023 CLINICAL DATA:  Altered mental status. EXAM: PORTABLE CHEST 1 VIEW COMPARISON:  Radiograph and chest CT scan from 09/17/2023. FINDINGS: Bilateral lungs appear hyperexpanded and hyperlucent with coarse bronchovascular markings, in keeping with COPD. There are bibasilar reticular opacities, which correlates with honeycombing on prior CT scan. There are nonspecific opacities predominantly in the bilateral upper lung zones, laterally, which corresponds to pleuroparenchymal disease. No acute lung consolidation or lung collapse. Bilateral lateral costophrenic angles are clear. Stable cardio-mediastinal silhouette. No acute osseous abnormalities. The soft tissues are within normal limits. IMPRESSION: 1. No acute cardiopulmonary abnormality. 2. Redemonstration of chronic lung parenchymal changes, as described above. Electronically Signed   By: Jules Schick M.D.   On: 10/01/2023 16:34   [place] CORE BIOPSY (SOFT TISSUE) Result Date: 09/20/2023 INDICATION: [id number] Soft tissue mass [id number]. History of lung mass with RIGHT upper extremity pathologic fracture EXAM: ULTRASOUND-GUIDED RIGHT UPPER EXTREMITY SOFT TISSUE MASS BIOPSY COMPARISON:  RIGHT upper extremity XRs and CT CAP, 09/17/2023. MEDICATIONS: None ANESTHESIA/SEDATION: Moderate (conscious) sedation was employed during this procedure. A total of Versed 2 mg and Fentanyl 100 mcg was administered intravenously. Moderate Sedation Time: 14 minutes. The patient's level of consciousness and vital signs were monitored continuously by radiology nursing throughout the procedure under my direct supervision. COMPLICATIONS: None immediate. TECHNIQUE: Informed written consent was obtained from the patient and/or patient's representative after a discussion of the risks, benefits and  alternatives to treatment. Questions regarding the procedure were encouraged and answered. Initial ultrasound scanning demonstrated hypervascular soft tissue mass about upper extremity pathologic fracture. An ultrasound image was saved for documentation purposes. The procedure was planned. A timeout was performed prior to the initiation of the procedure. The operative was prepped and draped in the usual sterile fashion, and a sterile drape was applied covering the operative field. A timeout was performed prior to the initiation of the procedure. Local anesthesia was provided with 1% lidocaine with epinephrine. Under direct ultrasound guidance, an 18 gauge core needle device was utilized to obtain to obtain 4 core needle biopsies of the RIGHT upper extremity mass. The samples were placed in saline and submitted to pathology. The needle was removed and superficial hemostasis was achieved with manual compression. Post procedure scan was negative for significant hematoma. A dressing was applied. The patient tolerated the procedure well without immediate postprocedural complication. IMPRESSION: Successful ultrasound guided biopsy of a RIGHT extremity soft tissue mass, about the pathologic fracture. Roanna Banning, MD [facility] Electronically Signed   By: Roanna Banning M.D.   On: 09/20/2023 20:10   EEG adult Result Date: 09/18/2023 Charlsie Quest, MD     09/18/2023  8:44 AM Patient Name: Regina [name] MRN: [id number] Epilepsy Attending: Charlsie Quest Referring Physician/Provider: Milon Dikes, MD Date: 09/17/2023 Duration: 22.31 mins Patient history: 58 y.o. female with medical history significant for COPD, depression, anxiety, and hypertension, presenting to the emergency department with aphasia, right sided weakness, and right arm pain. EEG to evaluate for seizure Level of alertness: Awake AEDs during EEG study: LEV Technical aspects: This EEG study was  done with scalp electrodes positioned according to the 10-20 International system of electrode placement. Electrical activity was reviewed with band pass filter of 1-70Hz , sensitivity of 7 uV/mm, display speed of 1mm/sec with a 60Hz  notched filter applied as appropriate. EEG data were recorded continuously and digitally stored.  Video monitoring was available and reviewed as appropriate. Description: The posterior dominant rhythm consists of  8 Hz activity of moderate voltage (25-35 uV) seen predominantly in posterior head regions, symmetric and reactive to eye opening and eye closing. EEG showed continuous polymorphic 3 to 6 Hz theta-delta slowing in left fronto-temporal region. Hyperventilation and photic stimulation were not performed.   ABNORMALITY - Continuous slow, left fronto-temporal IMPRESSION: This study is suggestive of cortical dysfunction arising from left fronto-temporal region likely secondary to underlying structural abnormality. No seizures or epileptiform discharges were seen throughout the recording. Charlsie Quest   MR BRAIN W WO CONTRAST Result Date: 09/18/2023 CLINICAL DATA:  Initial evaluation for intracranial mass. EXAM: MRI HEAD WITHOUT AND WITH CONTRAST TECHNIQUE: Multiplanar, multiecho pulse sequences of the brain and surrounding structures were obtained without and with intravenous contrast. CONTRAST:  5mL GADAVIST GADOBUTROL 1 MMOL/ML IV SOLN COMPARISON:  Prior CTs from earlier the same day. FINDINGS: Brain: Cerebral volume within normal limits. Patchy T2/FLAIR hyperintensity involving the periventricular deep white matter both cerebral hemispheres, consistent with chronic small vessel ischemic disease, moderately advanced in nature. No evidence for acute or subacute ischemia. No areas of chronic cortical infarction. No acute or chronic intracranial blood products. Round mass measuring 2.8 x 2.6 x 2.0 cm seen centered at the subcortical left frontal lobe (series 17, image 36).  Lesion demonstrates avid peripheral postcontrast enhancement. Extensive T2/FLAIR signal intensity throughout the adjacent left frontal lobe, consistent with vasogenic edema and/or possible infiltrating nonenhancing tumor. Partial effacement of the left lateral ventricle with 9 mm of left-to-right shift. No hydrocephalus or trapping. Finding is nonspecific, and could reflect a primary CNS neoplasm versus solitary intracranial metastasis. No other mass lesion or abnormal enhancement. Pituitary gland suprasellar region within normal limits. Vascular: Major intracranial vascular flow voids are maintained. Skull and upper cervical spine: Craniocervical ungual limits. Bone marrow signal intensity normal. No scalp soft tissue abnormality. Sinuses/Orbits: Globes orbital soft tissues within normal limits. Paranasal sinuses are largely clear. No significant mastoid effusion. Other: None. IMPRESSION: 1. 2.8 x 2.6 x 2.0 cm enhancing mass centered at the subcortical left frontal lobe, with extensive surrounding vasogenic edema and 9 mm of left-to-right shift. Primary differential considerations include a solitary intracranial metastasis versus primary CNS neoplasm. 2. Underlying moderately advanced chronic microvascular ischemic disease. Electronically Signed   By: Rise Mu M.D.   On: 09/18/2023 00:51   DG Humerus Right Result Date: 09/17/2023 CLINICAL DATA:  Fall, right arm deformity EXAM: RIGHT HUMERUS - 2+ VIEW COMPARISON:  None Available. 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 Mild degenerative changes of the right shoulder. 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 Electronically Signed   By: Charline Bills M.D.   On: 09/17/2023 21:40   CT CHEST ABDOMEN PELVIS W CONTRAST Result Date: 09/17/2023 CLINICAL DATA:  Left frontal lobe mass, possible metastatic lesion, assessment for other  metastatic disease or primary. * Tracking Code: BO * EXAM: CT CHEST, ABDOMEN, AND PELVIS WITH CONTRAST TECHNIQUE: Multidetector CT imaging of the chest, abdomen and pelvis was performed following the standard protocol during bolus administration of intravenous contrast. RADIATION DOSE REDUCTION: This exam was performed according to the departmental dose-optimization program which includes automated exposure control, adjustment of the mA and/or kV according to patient size and/or use of iterative reconstruction technique. CONTRAST:  60mL OMNIPAQUE IOHEXOL 350 MG/ML SOLN COMPARISON:  05/25/2023 FINDINGS: CT CHEST FINDINGS Cardiovascular: Mild atheromatous vascular calcification of the thoracic aorta. Mediastinum/Nodes: Pathologic right supraclavicular, right paratracheal, bilateral hilar, subcarinal, and  bilateral infrahilar adenopathy. Index right paratracheal node 1.4 cm in short axis on image 17 series 3. Index left hilar node 1.3 cm in short axis on image 26 series 3. Lungs/Pleura: Substantial biapical pleuroparenchymal scarring. This has an asymmetric nodular component in the right upper lobe measuring 2.9 by 1.5 by 1.8 cm (volume = 4.1 cm^3) which could be from scarring or tumor. Extensive emphysema.  Honeycombing at the lung bases. Musculoskeletal: Unremarkable CT ABDOMEN PELVIS FINDINGS Hepatobiliary: Unremarkable Pancreas: Unremarkable Spleen: Unremarkable Adrenals/Urinary Tract: There is dense contrast in the renal collecting systems, ureters, and urinary bladder due to recent injection for the CT angiogram. This lowers sensitivity for nonobstructive calculi. No renal or adrenal mass. No obvious abnormal filling defect along the urothelium. Stomach/Bowel: Mild prominence of stool in the rectal vault. Normal appendix. Otherwise unremarkable. Vascular/Lymphatic: Atherosclerosis is present, including aortoiliac atherosclerotic disease. Reproductive: Posterior positioning of the uterus with suspected uterine  fibroids and associated calcifications. Otherwise unremarkable. Other: No supplemental non-categorized findings. Musculoskeletal: Unremarkable IMPRESSION: 1. Pathologic right supraclavicular, right paratracheal, bilateral hilar, subcarinal, and bilateral infrahilar adenopathy. 2. Substantial biapical pleuroparenchymal scarring. This has an asymmetric nodular component in the right upper lobe measuring 2.9 by 1.5 by 1.8 cm (volume = 4.1 cm^ 3) which could be from scarring or tumor. PET-CT could help differentiate. 3. Extensive emphysema. 4. Honeycombing at the lung bases, compatible with interstitial lung disease. 5. Mild prominence of stool in the rectal vault. 6. Posterior positioning of the uterus with suspected uterine fibroids and associated calcifications. 7. Aortic atherosclerosis. Aortic Atherosclerosis (ICD10-I70.0) and Emphysema (ICD10-J43.9). Electronically Signed   By: Gaylyn Rong M.D.   On: 09/17/2023 19:23   CT ANGIO HEAD NECK W WO CM (CODE STROKE) Addendum Date: 09/17/2023 ADDENDUM REPORT: 09/17/2023 19:02 ADDENDUM: Delayed images demonstrate peripheral enhancement the mass measuring 2.6 x 2.3 x 2.1 cm. Other foci of enhancement are present. This most likely represents a solitary metastasis. Electronically Signed   By: Marin Roberts M.D.   On: 09/17/2023 19:02   Result Date: 09/17/2023 CLINICAL DATA:  Code stroke.  Left frontal lobe mass. EXAM: CT ANGIOGRAPHY HEAD AND NECK WITH AND WITHOUT CONTRAST TECHNIQUE: Multidetector CT imaging of the head and neck was performed using the standard protocol during bolus administration of intravenous contrast. Multiplanar CT image reconstructions and MIPs were obtained to evaluate the vascular anatomy. Carotid stenosis measurements (when applicable) are obtained utilizing NASCET criteria, using the distal internal carotid diameter as the denominator. RADIATION DOSE REDUCTION: This exam was performed according to the departmental dose-optimization  program which includes automated exposure control, adjustment of the mA and/or kV according to patient size and/or use of iterative reconstruction technique. CONTRAST:  75mL OMNIPAQUE IOHEXOL 350 MG/ML SOLN COMPARISON:  CT head without contrast 09/17/2023 FINDINGS: CTA NECK FINDINGS Aortic arch: A 3 vessel arch configuration is present. The aortic arch is incompletely imaged on this study. No significant focal stenosis is present the great vessel origins. Right carotid system: Atherosclerotic calcifications are present at the right carotid bifurcation predominantly involving the external carotid artery. The right common carotid artery is normal. The cervical right ICA is otherwise normal. Left carotid system: The left common carotid artery is within normal limits. Mild noncalcified plaque is present posteriorly at the bifurcation without significant stenosis. The cervical left ICA is otherwise normal. Vertebral arteries: The left vertebral artery is dominant vessel. Both vertebral arteries originate from the subclavian arteries without significant stenosis. No significant stenosis is present in either vertebral artery in the neck. Skeleton: Vertebral  body heights and alignment are normal. No focal osseous lesions are present. The patient is edentulous. Other neck: Soft tissues the neck are otherwise unremarkable. Salivary glands are within normal limits. Thyroid is normal. No significant adenopathy is present. No focal mucosal or submucosal lesions are present. Upper chest: Central and paraseptal emphysematous changes are present. Peripheral lung nodules are present in the right upper lobe. Please see dedicated CT of the chest for further description. No pneumothorax is present. Paratracheal adenopathy is present. Review of the MIP images confirms the above findings CTA HEAD FINDINGS Anterior circulation: Internal carotid arteries are within normal limits the skull base to the ICA termini. The A1 and M1 segments are  normal. Anterior communicating artery is patent. Mass effect deviates the left MCA and bilateral ACA branch vessels. No significant proximal stenosis or occlusion is present. Posterior circulation: The PICA origins are visualized and normal. Scratched at the left PICA origin is visualized and normal. The right PICA scratched at the hypoplastic right vertebral artery terminates at the PICA. The basilar artery is normal. The superior cerebellar arteries are patent. Posterior cerebral arteries originate from basilar tip. A right posterior communicating artery contributes. The PCA branch vessels are normal bilaterally. No aneurysm is present. Venous sinuses: The dural sinuses are patent. The straight sinus and deep cerebral veins are intact. Cortical veins are within normal limits. No significant vascular malformation is evident. Anatomic variants: None Review of the MIP images confirms the above findings IMPRESSION: 1. No emergent large vessel occlusion. 2. Mass effect deviates the left MCA and bilateral ACA branch vessels without significant proximal stenosis or occlusion. 3. Mild atherosclerotic changes at the carotid bifurcations bilaterally without significant stenosis. 4. Peripheral lung nodules in the right upper lobe. Please see dedicated CT of the chest for further description. 5. Paratracheal adenopathy is related to metastatic disease. Please see dedicated CT of the chest for further description. 6.  Emphysema (ICD10-J43.9). Electronically Signed: By: Marin Roberts M.D. On: 09/17/2023 18:53   CT HEAD CODE STROKE WO CONTRAST Result Date: 09/17/2023 CLINICAL DATA:  Code stroke. Neuro deficit, acute, stroke suspected. EXAM: CT HEAD WITHOUT CONTRAST TECHNIQUE: Contiguous axial images were obtained from the base of the skull through the vertex without intravenous contrast. RADIATION DOSE REDUCTION: This exam was performed according to the departmental dose-optimization program which includes automated  exposure control, adjustment of the mA and/or kV according to patient size and/or use of iterative reconstruction technique. COMPARISON:  None Available. FINDINGS: Brain: A left frontal lobe mass measures 20 x 20 x 18 mm. Extensive surrounding vasogenic edema present throughout the left frontal lobe. Mass effect is present with effacement of the sulci and 8 mm of left to right midline shift. Asymmetric effacement of the left lateral ventricle is noted. No other discrete lesions are present. No acute cortical infarcts are present. The brainstem and cerebellum are within normal limits. Midline structures are within normal limits. Vascular: No hyperdense vessel or unexpected calcification. Skull: Calvarium is intact. No focal lytic or blastic lesions are present. No significant extracranial soft tissue lesion is present. Sinuses/Orbits: The paranasal sinuses and mastoid air cells are clear. The globes and orbits are within normal limits. IMPRESSION: 1. 20 x 20 x 18 mm left frontal lobe mass with extensive surrounding vasogenic edema. This is concerning for a primary or metastatic neoplasm. Recommend MRI the brain without and with contrast for further evaluation. 2. Mass effect with effacement of the sulci and 8 mm of left to right midline shift. 3.  No other discrete lesions are present. 4. No other acute intracranial abnormality. The above was relayed via text pager to Dr. Milon Dikes on 09/17/2023 at 18:25 . Electronically Signed   By: Marin Roberts M.D.   On: 09/17/2023 18:25   DG Chest Portable 1 View Result Date: 09/17/2023 CLINICAL DATA:  Tachycardia. EXAM: PORTABLE CHEST 1 VIEW COMPARISON:  Radiograph 05/25/2023 FINDINGS: The heart is normal in size. Stable mediastinal contours. Chronic hyperinflation and emphysema. There is biapical pleuroparenchymal scarring, similar. Reticular opacities at both lung bases are without significant interval change, typical of honeycombing. No definite acute airspace  disease. No features of pulmonary edema. No pneumothorax or significant pleural effusion. IMPRESSION: 1. Chronic hyperinflation and emphysema. 2. Bibasilar reticular opacities, typical of honeycombing, without significant interval change. 3. Biapical pleuroparenchymal scarring. Electronically Signed   By: Narda Rutherford M.D.   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 The patient would benefit from surgical resection of the brain metastasis.*  In addition, the patient would potentially benefit from radiotherapy.* The options include whole brain irradiation versus stereotactic radiosurgery. There are pros and cons associated with each of these potential treatment options. Whole brain radiotherapy would treat the known metastatic deposits and help provide some reduction of risk for future brain metastases. However, whole brain radiotherapy carries potential risks including hair loss, subacute somnolence, and neurocognitive changes including a possible reduction in short-term memory. Whole brain radiotherapy also may carry a lower likelihood of tumor control at the treatment sites because of the low-dose used. Stereotactic radiosurgery carries a higher likelihood for local tumor control at the targeted sites with lower associated risk for neurocognitive changes such as memory loss.* However, the use of stereotactic radiosurgery in this setting may leave the patient at increased risk for new brain metastases elsewhere in the brain as high as 50-60%. Accordingly, patients who receive stereotactic radiosurgery in this setting should undergo ongoing surveillance imaging with brain MRI more frequently in order to identify and treat new small brain metastases before they become symptomatic. Stereotactic radiosurgery does carry some different risks, including a risk of radionecrosis.  PLAN: Today, I reviewed the findings and  workup thus far with the patient. We discussed the dilemma regarding whole brain radiotherapy versus stereotactic radiosurgery. We discussed the pros and cons of each. We also discussed the logistics and delivery of each. We reviewed the results associated with each of the treatments described above. The patient seems to understand the treatment options and would like to proceed with stereotactic radiosurgery.  In terms of timing of the stereotactic radiosurgery, evidence suggests that risk of radionecrosis and leptomeningeal recurrence is lower when used in the pre-operative setting as opposed to post-operative SRS.*  Tentatively, the patient will be set up for [facility] CT Simulation today, with treatment starting tomorrow, and Surgical resection pending.  The patient's expressive aphasia limits her capacity to understand and communicate.  She is understandably worried and not sure what to do.  I tried to reassure her that if we can get her aphasia improved, this could substantially improve her quality of life and ability to re-gain independence.  While she has some reluctance to proceed, she agreed in hopes of regaining independence.  I personally spent 30 minutes in this encounter including chart review, reviewing radiological studies, meeting face-to-face with the patient, entering orders and completing documentation.    ------------------------------------------------  Artist Pais. Kathrynn Running, M.D.     *  References:  1: Patchell RA, Tibbs PA, Ralene Bathe, [address] RJ, [place], Ninetta Lights JS, [place] B. A randomized trial of surgery in the treatment of single metastases to the brain. Malva Limes Med. 1990 Feb 22;322(8):494-500. PubMed PMID: 2130865.   2: Patchell RA, Tibbs PA, Regine WF, Meade Maw, Mohiuddin M, Marlow Baars, [place], [place], [place] B. Postoperative radiotherapy in the treatment of single metastases to the brain: a randomized trial. JAMA. 1998 Nov  4;280(17):1485-9. PubMed PMID: 7846962.   3: Eliezer Lofts, Scherrie Gerlach, Hess KR, Maxine Glenn, Lang FF, Kornguth DG, [place], Swint JM, Shiu AS, Maor MH, [place] Donnellson. Neurocognition in patients with brain metastases treated with radiosurgery or radiosurgery plus whole-brain irradiation: a randomised controlled trial. Lancet Oncol. 2009 Nov;10(11):1037-44. doi: 10.1016/S1470-2045(09)70263-3. Epub 2009 Oct 2. PubMed PMID: 95284132.  4: Bobbye Morton, Reyes Ivan, Lissa Merlin, Crocker IR, Nadeen Landau, Luis Abed, [address], Teena Irani, [place], Wait SD, Elpidio Eric, Shu HG, [place]. Comparing Preoperative With Postoperative Stereotactic Radiosurgery for Resectable Brain Metastases: A Multi-institutional Analysis. Neurosurgery. 2015 Nov 2. [Epub ahead of print] PubMed PMID: [id number].   ------------------------------------------------   Margaretmary Dys, MD [facility]  Radiation Oncology Medical Director and Director of Stereotactic Radiosurgery Direct Dial: [phone]  Fax: [phone] [email or web address]  Skype  [email or web address]

## 2023-10-03 NOTE — Progress Notes (Signed)
Patient transported off unit to radiation via stretcher by radiation staff.

## 2023-10-03 NOTE — Progress Notes (Signed)
Chaplain received a consult to provide information on HCPOA to Regina [name].  Chaplain brought paperwork, but Regina [name] wants time to look it over with her designee.  If Unknown or her designee have any further questions, please page [place] at [phone] or consult [place] again.  [address], Bcc Pager, [phone]

## 2023-10-03 NOTE — Assessment & Plan Note (Signed)
Start amlodipine

## 2023-10-03 NOTE — Progress Notes (Signed)
Patient ID: Regina [name], female   DOB: 05-08-1966, 58 y.o.   MRN: [id number] BP (!) 177/92   Pulse 96   Temp 98.5 F (36.9 C) (Oral)   Resp 16   Ht 5\' 6"  (1.676 m)   Wt 48 kg   LMP 06/06/2010   SpO2 99%   BMI 17.08 kg/m  Patient is currently asleep. Events surrounding admission noted. SRS tomorrow at 1145, resection planned for Friday. Will see tomorrow during SRS. Continue decadron.

## 2023-10-03 NOTE — Evaluation (Signed)
Occupational Therapy Evaluation Patient Details Name: Regina [name] MRN: [id number] DOB: 13-Aug-1966 Today's Date: 10/03/2023   History of Present Illness 58 yo female who was admitted on 10/01/2023 due to difficulty speaking. Pt was hospitalized 1/11-1/15/2025 secondary to metastatic lung ca to brain and R UE weakness with pathologic humerus fx. Pt was set up for OP PET, radiation and orthopedic and pt did not arrive for any appointments. Pt PMH includes but is not limited to: allergy, anxiety, brain mass, COPD, emphysema, HTN, and adenocarcinoma of lung.   Clinical Impression   Patient is a 58 year old female who was admitted for above. Patient was        If plan is discharge home, recommend the following: A little help with bathing/dressing/bathroom;Assistance with cooking/housework;Direct supervision/assist for medications management;Assist for transportation;Help with stairs or ramp for entrance;Direct supervision/assist for financial management;Supervision due to cognitive status    Functional Status Assessment  Patient has had a recent decline in their functional status and demonstrates the ability to make significant improvements in function in a reasonable and predictable amount of time.  Equipment Recommendations  None recommended by OT       Precautions / Restrictions Precautions Precautions: Other (comment) Precaution Comments: aphasia, decreased cog Required Braces or Orthoses: Sling Restrictions Weight Bearing Restrictions Per Provider Order: Yes RUE Weight Bearing Per Provider Order: Non weight bearing Other Position/Activity Restrictions: patient with fracutre of RUE with pending possible surgical intervention per chart.      Mobility Bed Mobility Overal bed mobility: Modified Independent                  Transfers Overall transfer level: Modified independent     Sit to Stand: Modified independent (Device/Increase time)                  Balance  Overall balance assessment: No apparent balance deficits (not formally assessed)             ADL either performed or assessed with clinical judgement   ADL Overall ADL's : Needs assistance/impaired           Upper Body Bathing Details (indicate cue type and reason): educated on see saw washing under RUE to prevent increased pain. patient nodded in understanding. shoulder precautions paper provided to give tips on how to complete ADLs without movement. patient reported she would provided them to her niece.     Upper Body Dressing : Minimal assistance Upper Body Dressing Details (indicate cue type and reason): with education on how to complete without movement of RUE. patient when handed hand ot for sling donning reported she would have it on already. patient was educated that with participation in ADLs she would need to take it on and off. patient frustrated at this time reporting that she would not. unable to get clarity of what patient was attempting to communicate with patietn shutting down with frustrations. patietn reporting that she would give the papers to her niece.                   General ADL Comments: patient declining to participate further in therapy at this time. patient reported she did not need it. patient noted to have significant word finding difficulties with SLP noted to be orders for cog/language. patient not open to further cog with OT at this time.     Vision Patient Visual Report: No change from baseline              Pertinent  Vitals/Pain Pain Assessment Pain Assessment: Faces Faces Pain Scale: Hurts little more Pain Location: RUE Pain Descriptors / Indicators: Discomfort, Grimacing Pain Intervention(s): Limited activity within patient's tolerance, Monitored during session, Repositioned     Extremity/Trunk Assessment Upper Extremity Assessment Upper Extremity Assessment: RUE deficits/detail RUE Deficits / Details: pt with humerus fx and in  sling RUE Coordination: decreased fine motor;decreased gross motor   Lower Extremity Assessment Lower Extremity Assessment: Defer to PT evaluation   Cervical / Trunk Assessment Cervical / Trunk Assessment: Normal   Communication Communication Communication: Difficulty following commands/understanding;Difficulty communicating thoughts/reduced clarity of speech   Cognition Arousal: Alert Behavior During Therapy: Flat affect Overall Cognitive Status: Difficult to assess           General Comments: patient frustracted quickly with word finding difficulties. patient asking about sling but having increased difficulty with getting thoughts out. not accepting of education or suggestion to meet with family to educate them about how to guard RUE.                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reported having niece who helps with brother and her IADLs since last hospitalization      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        OT Problem List: Decreased range of motion;Decreased activity tolerance;Decreased strength;Decreased cognition;Decreased safety awareness;Decreased knowledge of use of DME or AE;Decreased knowledge of precautions;Pain;Impaired UE functional use      OT Treatment/Interventions: Self-care/ADL training;Therapeutic exercise;DME and/or AE instruction;Therapeutic  activities;Cognitive remediation/compensation;Patient/family education    OT Goals(Current goals can be found in the care plan section) Acute Rehab OT Goals Patient Stated Goal: to go home OT Goal Formulation: Patient unable to participate in goal setting Time For Goal Achievement: 10/17/23 Potential to Achieve Goals: Fair  OT Frequency: Min 1X/week    Co-evaluation PT/OT/SLP Co-Evaluation/Treatment: Yes Reason for Co-Treatment: Necessary to address cognition/behavior during functional activity PT goals addressed during session: Mobility/safety with mobility OT goals addressed during session: ADL's and self-care      AM-PAC OT "6 Clicks" Daily Activity     Outcome Measure Help from another person eating meals?: A Little Help from another person taking care of personal grooming?: A Little Help from another person toileting, which includes using toliet, bedpan, or urinal?: A Little Help from another person bathing (including washing, rinsing, drying)?: A Little Help from another person to put on and taking off regular upper body clothing?: A Little Help from another person to put on and taking off regular lower body clothing?: A Little 6 Click Score: 18   End of Session Equipment Utilized During Treatment: Other (comment) (sling) Nurse Communication: Mobility status  Activity Tolerance: Patient tolerated treatment well Patient left: in bed;with call bell/phone within reach;with bed alarm set  OT Visit Diagnosis: Other abnormalities of gait and mobility (R26.89);Pain;Other symptoms and signs involving cognitive function;Cognitive communication deficit (R41.841) Symptoms and signs involving cognitive functions: Other cerebrovascular disease Pain - Right/Left:  Right Pain - part of body: Arm                Time: 5409-8119 OT Time Calculation (min): 19 min Charges:  OT General Charges $OT Visit: 1 Visit OT Evaluation $OT Eval Low Complexity: 1 Low  Sundeep Destin OTR/L, MS Acute  Rehabilitation Department Office# [phone]   Selinda Flavin 10/03/2023, 12:25 PM

## 2023-10-03 NOTE — Progress Notes (Signed)
   10/03/23 1500  Spiritual Encounters  Type of Visit Follow up  Care provided to: Pt and family  Referral source Clinical staff  Reason for visit Advance directives  OnCall Visit No  Spiritual Framework  Presenting Themes Coping tools;Impactful experiences and emotions;Courage hope and growth;Rituals and practive;Significant life change   Chaplain met again with family at bedside of patient and reviewed details of AD . Arranged for AD to be signed by witnesses and notarized.  Copy neeeds to go to family member Niece present in room.  Provided spiritual care and encouragement and counsel to patient.

## 2023-10-03 NOTE — Plan of Care (Signed)
Problem: Clinical Measurements: Goal: Ability to maintain clinical measurements within normal limits will improve Outcome: Progressing   Problem: Clinical Measurements: Goal: Will remain free from infection Outcome: Progressing   Problem: Clinical Measurements: Goal: Respiratory complications will improve Outcome: Progressing   Problem: Clinical Measurements: Goal: Cardiovascular complication will be avoided Outcome: Progressing   Problem: Activity: Goal: Risk for activity intolerance will decrease Outcome: Progressing

## 2023-10-04 ENCOUNTER — Ambulatory Visit: Payer: 59 | Admitting: Radiation Oncology

## 2023-10-04 ENCOUNTER — Ambulatory Visit
Admit: 2023-10-04 | Discharge: 2023-10-04 | Disposition: A | Discharge status: Home / Self Care | Payer: 59 | Attending: Radiation Oncology | Admitting: Radiation Oncology

## 2023-10-04 ENCOUNTER — Other Ambulatory Visit: Payer: Self-pay

## 2023-10-04 ENCOUNTER — Ambulatory Visit: Payer: 59

## 2023-10-04 ENCOUNTER — Encounter (HOSPITAL_COMMUNITY): Payer: Self-pay

## 2023-10-04 ENCOUNTER — Other Ambulatory Visit: Payer: Self-pay | Admitting: Radiation Therapy

## 2023-10-04 ENCOUNTER — Ambulatory Visit
Admission: RE | Admit: 2023-10-04 | Discharge: 2023-10-04 | Disposition: A | Discharge status: Home / Self Care | Payer: 59 | Source: Ambulatory Visit | Attending: Radiation Oncology | Admitting: Radiation Oncology

## 2023-10-04 ENCOUNTER — Inpatient Hospital Stay (HOSPITAL_COMMUNITY)
Admit: 2023-10-04 | Discharge: 2023-10-04 | Disposition: A | Discharge status: Home / Self Care | Payer: MEDICAID | Attending: Internal Medicine | Admitting: Internal Medicine

## 2023-10-04 ENCOUNTER — Inpatient Hospital Stay (INDEPENDENT_AMBULATORY_CARE_PROVIDER_SITE_OTHER): Payer: 59 | Admitting: Primary Care

## 2023-10-04 DIAGNOSIS — R4182 Altered mental status, unspecified: Secondary | ICD-10-CM | POA: Diagnosis not present

## 2023-10-04 DIAGNOSIS — C7931 Secondary malignant neoplasm of brain: Secondary | ICD-10-CM

## 2023-10-04 DIAGNOSIS — G936 Cerebral edema: Secondary | ICD-10-CM | POA: Diagnosis not present

## 2023-10-04 DIAGNOSIS — R569 Unspecified convulsions: Secondary | ICD-10-CM

## 2023-10-04 DIAGNOSIS — M84521G Pathological fracture in neoplastic disease, right humerus, subsequent encounter for fracture with delayed healing: Secondary | ICD-10-CM | POA: Diagnosis not present

## 2023-10-04 DIAGNOSIS — E43 Unspecified severe protein-calorie malnutrition: Secondary | ICD-10-CM | POA: Insufficient documentation

## 2023-10-04 DIAGNOSIS — M84421G Pathological fracture, right humerus, subsequent encounter for fracture with delayed healing: Secondary | ICD-10-CM

## 2023-10-04 DIAGNOSIS — Z7189 Other specified counseling: Secondary | ICD-10-CM

## 2023-10-04 DIAGNOSIS — M84521A Pathological fracture in neoplastic disease, right humerus, initial encounter for fracture: Secondary | ICD-10-CM

## 2023-10-04 DIAGNOSIS — C3411 Malignant neoplasm of upper lobe, right bronchus or lung: Secondary | ICD-10-CM | POA: Diagnosis not present

## 2023-10-04 DIAGNOSIS — C3490 Malignant neoplasm of unspecified part of unspecified bronchus or lung: Secondary | ICD-10-CM

## 2023-10-04 DIAGNOSIS — Z515 Encounter for palliative care: Secondary | ICD-10-CM

## 2023-10-04 DIAGNOSIS — C7951 Secondary malignant neoplasm of bone: Secondary | ICD-10-CM

## 2023-10-04 LAB — RAD ONC ARIA SESSION SUMMARY
Course Elapsed Days: 0
Plan Fractions Treated to Date: 1
Plan Prescribed Dose Per Fraction: 8 Gy
Plan Total Fractions Prescribed: 3
Plan Total Prescribed Dose: 24 Gy
Reference Point Dosage Given to Date: 8 Gy
Reference Point Session Dosage Given: 8 Gy
Session Number: 1

## 2023-10-04 NOTE — Progress Notes (Signed)
Daily Progress Note   Patient Name: Regina [name]       Date: 10/04/2023 DOB: June 22, 1966  Age: 58 y.o. MRN#: [id number] Attending Physician: Alberteen Sam, * Primary Care Physician: [name], NP Admit Date: 10/01/2023 Length of Stay: 3 days  Reason for Consultation/Follow-up: Establishing goals of care  Subjective:   CC: Patient inquiring when she can go home.  Following up regarding complex medical decision making.  Subjective:  Reviewed EMR prior to presenting to bedside.  Presented to bedside to meet with patient.  Introduced myself as a member of the palliative medicine team.  No family present at bedside.  Patient laying in bed awake watching TV and walking to visit.  Patient inquiring when she will be able to get out of the hospital.  Able to discuss that patient is scheduled for radiation and surgical intervention.  Patient was wondering when this would be occurring.  Noted would reach out to team so could be further discussions to assist with care planning.  Did express concern that patient would not be going home soon with multiple medical interventions planned.   Inquired about symptom burden at that time to which patient stated that she did not have any symptoms of concern.   Spent time providing emotional support reactive listening.  All questions answered at that time.  Noted palliative medicine team will continue to follow with patient's medical journey.  Able to discuss care with IDT to determine plan for coordinating care moving forward.  Objective:   Vital Signs:  BP 136/87 (BP Location: Left Arm)   Pulse 79   Temp 98.4 F (36.9 C) (Oral)   Resp 18   Ht 5\' 6"  (1.676 m)   Wt 48 kg   LMP 06/06/2010   SpO2 99%   BMI 17.08 kg/m   Physical Exam: General: NAD, awake, pleasant Cardiovascular: RRR Respiratory: no increased work of breathing noted, not in respiratory distress Neuro: Awake, interactive  Imaging:  I personally reviewed recent  imaging.   Assessment & Plan:   Assessment: Patient is a 58 year old female with a past medical history of tobacco use, COPD, hypertension, anxiety, and recently diagnosed lung cancer with metastatic disease to the brain, lymph node, and bone with noted pathologic right humerus fracture who was admitted on 10/01/2023 for failure to care for self at home.  Since admission, radiation oncology, oncology, and neurosurgery consulted for evaluation and recommendations.  Palliative medicine team consulted to assist with complex medical decision making.  Recommendations/Plan: # Complex medical decision making/goals of care:  - Patient has discussed with providers plan to proceed with cancer directed therapies.  Patient planning to start radiation therapy today and receive therapy tomorrow as well prior to transfer to [facility] for resection of brain mass on Friday, 1/31.  Oncology following and planning for follow-up in the outpatient setting.  -Recommend referral to outpatient palliative medicine team at [facility] for follow-up if appropriate at time of discharge.  -  Code Status: Full Code  # Symptom management: Patient denies any symptoms of concern when seen today.  # Psychosocial Support:  -Niece-[name] Okey Dupre   -Niece was sent to email from radiation oncology regarding due to a plan for radiation and resection as per rad onc team.   -Chaplain previously consulted to assist with ACP documentation completion.  Patient did not want to complete this documentation with chaplain at time of visit on 10/03/2023.  # Discharge Planning: To Be Determined  Discussed with: Patient, radiation oncology, RN,  hospitalist  Thank you for allowing the palliative care team to participate in the care Destiney Weld.  Alvester Morin, DO Palliative Care Provider PMT # [phone]  If patient remains symptomatic despite maximum doses, please call PMT at [phone] between 0700 and 1900. Outside of these hours,  please call attending, as PMT does not have night coverage.  Personally spent 35 minutes in patient care including extensive chart review (labs, imaging, progress/consult notes, vital signs), medically appropraite exam, discussed with treatment team, education to patient, family, and staff, documenting clinical information, medication review and management, coordination of care, and available advanced directive documents.

## 2023-10-04 NOTE — Progress Notes (Signed)
SLP Cancellation Note  Patient Details Name: Regina [name] MRN: [id number] DOB: April 07, 1966   Cancelled treatment:       Reason Eval/Treat Not Completed: Patient at procedure or test/unavailable. Unable to complete SLE at this time, as pt is currently off unit. Will continue efforts.  Regina [name], [facility], CCC-SLP Speech Language Pathologist  [place] 10/04/2023, 12:49 PM

## 2023-10-04 NOTE — Procedures (Signed)
Patient Name: Regina [name]  MRN: [id number]  Epilepsy Attending: Charlsie Quest  Referring Physician/Provider: Rolly Salter, MD  Date: 10/04/2023 Duration: 23.15 mins  Patient history: 58yo F with ams. EEG to evaluate for seizure  Level of alertness: Awake, asleep  AEDs during EEG study: LEV  Technical aspects: This EEG study was done with scalp electrodes positioned according to the 10-20 International system of electrode placement. Electrical activity was reviewed with band pass filter of 1-70Hz , sensitivity of 7 uV/mm, display speed of 54mm/sec with a 60Hz  notched filter applied as appropriate. EEG data were recorded continuously and digitally stored.  Video monitoring was available and reviewed as appropriate.  Description: The posterior dominant rhythm consists of 7.5Hz  predominantly in posterior head regions, symmetric and reactive to eye opening and eye closing. Sleep was characterized by vertex waves, sleep spindles (12 to 14 Hz), maximal frontocentral region. EEG showed continuous 3 to 6 Hz theta-delta slowing in left hemisphere, maximal left fronto-temporal region. Hyperventilation and photic stimulation were not performed.     ABNORMALITY - Continuous slow,  left hemisphere, maximal left fronto-temporal region  IMPRESSION: This study is suggestive of cortical dysfunction arising from  left hemisphere, maximal left fronto-temporal region likely secondary to underlying structural abnormality. No seizures or epileptiform discharges were seen throughout the recording.  Tycen Dockter Annabelle Harman

## 2023-10-04 NOTE — Progress Notes (Addendum)
Progress Note   Patient: Regina [name] JXB:[id number] DOB: 1966/02/02 DOA: 10/01/2023     3 DOS: the patient was seen and examined on 10/04/2023 at 1:08PM      Brief hospital course: 58 y.o. F with hx smoking, COPD, HTN, and anxiety and recently diagnosed few weeks ago with lung cancer metastatic to brain, LN and bone (c/b pathological right humerus fracture) who was admitted due to failure to care for self at home.  Recently admitted 1/11 to 1/14, found to have lung mass, brain mass and pathologic fracture due to right humerus met --> biopsy showed NSCLC.  Saw Oncology after discharge, discussed treatment options and was referred for PET and Radiation oncology appointments.  Subsequently missed all appointments, and then showed up at the hospital, aphasic and appearing confused.         Assessment and Plan: * Primary adenocarcinoma of upper lobe of right lung (HCC) Follows with [name].   - Follow up with [name] after discharge    Cerebral edema Metastasis to the brain Neoplasm causing mass effect and brain compression on adjacent structures [facility]) - Consult Neurosurgery, appreciate cares - Continue Keppra and dexamethasone - Plan for [facility] by Radiation oncology this week (started Tuesday) - Plan for surgical resection by [name] at [facility] on Friday Jan 31    Aphasia See dispo below - PT/OT   Pathologic humerus fracture I have reached out to Orthopedics regarding timing of surgical repair.  They will contact us again. - Arm in sling - NWB RUE   COPD (chronic obstructive pulmonary disease) (HCC) No active flare, not on medication at home   HTN (hypertension) BP controlled - Continue new amlodipine   Severe protein calorie malnutrition - Consult dietitian - Start supplements       Subjective: She has no complaints.  No pain.  She can speak partial sentences now, but has severe word finding difficulty.  Hard to answer open-ended questions.  Can  answer yes no quite well.     Physical Exam: BP 132/74   Pulse 87   Temp 99.4 F (37.4 C) (Oral)   Resp 18   Ht 5\' 6"  (1.676 m)   Wt 48 kg   LMP 06/06/2010   SpO2 98%   BMI 17.08 kg/m   Thin adult female, ambulating in the room, makes eye contact, tries to respond to questions RRR, no murmurs, no peripheral edema Respiratory rate normal, lungs clear without rales or wheezes Abdomen soft no tenderness palpation Attention normal, affect tired, she speaks fluently, but has word finding difficulties.  The right arm is not tested due to sling, but the left arm and legs are both 5/5 strength.     Data Reviewed: Discussed with orthopedics   Family Communication: Niece by phone    Disposition: Status is: Inpatient Patient was readmitted due to aphasia and inability to coordinate complex outpatient management of her metastatic lung cancer  She will stay here for radiation therapy this week then transfer to [facility] Friday for surgical resection  After surgery, she will need PT to evaluate SNF vs Home.    IMPORTANT: at the time of discharge home she has dense aphasia due to her met.   She cannot drive (humerus fracture), she lives with a son who works and cannot provide transportation and she cannot speak on the phone to coordinate transportation (aphasia)   If she has multiple outpatient follow ups, this will all need to be arranged for her carefully  Author: Alberteen Sam, MD 10/04/2023 3:57 PM  For on call review [email or web address].

## 2023-10-04 NOTE — TOC Initial Note (Signed)
Transition of Care [facility]) - Initial/Assessment Note    Patient Details  Name: Regina [name] MRN: [id number] Date of Birth: 08/27/1966  Transition of Care [facility]) CM/SW Contact:    Lanier Clam, RN Phone Number: 10/04/2023, 2:43 PM  Clinical Narrative:   Spoke to [name](Niece) agrees to ([facility] for otpt palliative care services @ home                 Expected Discharge Plan: Home/Self Care Barriers to Discharge: Continued Medical Work up   Patient Goals and CMS Choice            Expected Discharge Plan and Services                                              Prior Living Arrangements/Services                       Activities of Daily Living   ADL Screening (condition at time of admission) Independently performs ADLs?: Yes (appropriate for developmental age) Is the patient deaf or have difficulty hearing?: No Does the patient have difficulty seeing, even when wearing glasses/contacts?: No Does the patient have difficulty concentrating, remembering, or making decisions?: Yes  Permission Sought/Granted                  Emotional Assessment              Admission diagnosis:  Cerebral edema (HCC) [G93.6] Vasogenic brain edema (HCC) [G93.6] Pathological fracture of right humerus due to neoplastic disease with delayed healing, subsequent encounter [M84.521G] Lung cancer metastatic to brain (HCC) [C34.90, C79.31] Patient Active Problem List   Diagnosis Date Noted   Palliative care encounter 10/04/2023   Pathological fracture of right humerus with delayed healing 10/04/2023   Lung cancer metastatic to brain (HCC) 10/04/2023   Vasogenic brain edema (HCC) 10/04/2023   Counseling and coordination of care 10/04/2023   Metastasis to brain (HCC) 10/03/2023   Metastasis to bone (HCC) 10/03/2023   Cerebral edema (HCC) 10/01/2023   Primary adenocarcinoma of upper lobe of right lung (HCC) 09/22/2023   Pathological fracture in  neoplastic disease, right humerus, initial encounter for fracture 09/19/2023   Neoplasm causing mass effect and brain compression on adjacent structures (HCC) 09/18/2023   COPD (chronic obstructive pulmonary disease) (HCC)    Anxiety    Hyponatremia 04/16/2018   Depression 04/16/2018   Hypertensive urgency    HTN (hypertension) 01/13/2016   Right anterior shoulder pain 07/18/2014   Postconcussion syndrome 05/28/2014   PCP:  [name], NP Pharmacy:   [facility] [address] [place] [address] Phone: [phone] Fax: [phone]     Social Drivers of Health (SDOH) Social History: SDOH Screenings   Food Insecurity: No Food Insecurity (09/21/2023)  Housing: Low Risk  (09/21/2023)  Transportation Needs: No Transportation Needs (09/21/2023)  Utilities: Not At Risk (09/21/2023)  Depression (PHQ2-9): Low Risk  (06/08/2023)  Tobacco Use: Medium Risk (10/01/2023)   SDOH Interventions:     Readmission Risk Interventions    09/20/2023    2:40 PM  Readmission Risk Prevention Plan  Medication Screening Complete  Transportation Screening Complete

## 2023-10-04 NOTE — Progress Notes (Signed)
EEG complete - results pending

## 2023-10-04 NOTE — Progress Notes (Signed)
[facility] Liaison Note  Notified by Lanier Clam, [facility] of patient/family request for outpatient palliative services with [facility] at home after discharge.  Will follow for discharge disposition.  Please call with any questions or concerns.  Thank you for the opportunity to participate in this patients care.  Thank you, Haynes Bast, BSN, [facility] [phone]

## 2023-10-04 NOTE — Plan of Care (Signed)
Problem: Health Behavior/Discharge Planning: Goal: Ability to manage health-related needs will improve Outcome: Progressing   Problem: Clinical Measurements: Goal: Will remain free from infection Outcome: Progressing   Problem: Clinical Measurements: Goal: Respiratory complications will improve Outcome: Progressing   Problem: Clinical Measurements: Goal: Cardiovascular complication will be avoided Outcome: Progressing   Problem: Activity: Goal: Risk for activity intolerance will decrease Outcome: Progressing

## 2023-10-04 NOTE — Progress Notes (Signed)
Initial Nutrition Assessment  DOCUMENTATION CODES:   Severe malnutrition in context of chronic illness  INTERVENTION:  - DYS 3 diet.  - Ensure Plus High Protein po BID, each supplement provides 350 kcal and 20 grams of protein. - Monitor weight trends.   NUTRITION DIAGNOSIS:   Severe Malnutrition related to chronic illness as evidenced by severe fat depletion, severe muscle depletion.  GOAL:   Patient will meet greater than or equal to 90% of their needs  MONITOR:   PO intake, Supplement acceptance, Weight trends  REASON FOR ASSESSMENT:   Consult Assessment of nutrition requirement/status  ASSESSMENT:   58 y.o. F with hx smoking, COPD, HTN, anxiety, and recently diagnosed a few weeks ago with stage IV lung cancer metastatic to brain and bone, recent right humerus fracture who was admitted due to failure to care for self at home.  Patient reports a UBW of 115# and notes she has had weight loss over the past couple weeks. Per EMR, weight mostly stable over the past year. She is noted to now be weighed at 105#, and has had a possible 9# or 8% weight loss within the past month. However, several weights in December are exactly the same so question if these were copied over and weight loss occurred in larger time frame.  Patient reports typically eating 4-5 meals a day at home. Recently, she admits to poor appetite and eaitng only 1-2 meals a day.   Thankfully, she shares her current appetite is much improved and she has been eating well this admission. She reports liking the Ensure she is receiving and wanting to continue drinking it after discharge.  Encouraged intake of 3 or more meals a day in addition to Ensure to support increased nutrient needs with cancer/treatment.    Medications reviewed and include: MVI  Labs reviewed:  -   NUTRITION - FOCUSED PHYSICAL EXAM:  Flowsheet Row Most Recent Value  Orbital Region Severe depletion  Upper Arm Region Severe depletion   Thoracic and Lumbar Region Moderate depletion  Buccal Region Moderate depletion  Temple Region Moderate depletion  Clavicle Bone Region Severe depletion  Clavicle and Acromion Bone Region Severe depletion  Scapular Bone Region Unable to assess  Dorsal Hand Moderate depletion  Patellar Region Severe depletion  Anterior Thigh Region Severe depletion  Posterior Calf Region Severe depletion  Edema (RD Assessment) None  Hair Reviewed  Eyes Reviewed  Mouth Reviewed  Skin Reviewed  Nails Reviewed       Diet Order:   Diet Order             DIET DYS 3 Room service appropriate? Yes; Fluid consistency: Thin  Diet effective now                   EDUCATION NEEDS:  Education needs have been addressed  Skin:  Skin Assessment: Reviewed RN Assessment  Last BM:  1/27  Height:  Ht Readings from Last 1 Encounters:  10/01/23 5\' 6"  (1.676 m)   Weight:  Wt Readings from Last 1 Encounters:  10/01/23 48 kg    BMI:  Body mass index is 17.08 kg/m.  Estimated Nutritional Needs:  Kcal:  1700-1900 kcals Protein:  75-90 grams Fluid:  >/= 1.7L    Shelle Iron RD, LDN Contact via Secure Chat.

## 2023-10-04 NOTE — Progress Notes (Signed)
Regina [name]   DOB:03/03/1966   WU#:[id number]      ASSESSMENT & PLAN:  1.  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   -Initially presented January 2025 with aphasia and right arm weakness. -On Keppra and dexamethasone. -Radiation oncology consulted.  For [facility] today. -Neurology consulted.  Plan for resection on Friday 1/31. - Patient seen in consultation with medical oncology [name] on 09/22/2023.  Prognosis and treatment options was discussed at that time including radiation, chemotherapy, and immunotherapy.  Patient expressed willingness to proceed with treatment.  She was started on dex tapered dosing, Keppra, oxycodone and acetaminophen for pain and was referred to Rad Onc.  Molecular marker testing was ordered.   -Patient was scheduled to follow-up with outpatient oncology on 10/06/2023.  If patient is still hospitalized during that time outpatient visit will be rescheduled. -Medical oncology/[name] following  2.  Aphasia - Improving.  Patient conversed well today. - Patient was admitted with aphasia - Status post initiation of dexamethasone - Likely due to to malignancy with mets to the brain. - Monitor closely  3.  Right arm fracture - Patient with sling intact on the right arm. - Orthopedic eval and management recommended  4.  Hypertension - Blood pressure stable - Antihypertensives as ordered, amlodipine - Monitor BP closely  5. COPD - Not on medication at home - Monitor respiratory status closely    Code Status Full    Subjective:  Patient is seen awake and alert laying in bed.  Patient is oriented to self and place.  No confusion noted at this time.  No seizures noted.  Speech is appropriate.  She denies pain, denies nausea, vomiting, chest pain, shortness of breath.  Objective:  Vitals:   10/03/23 2100 10/04/23 0520  BP: (!) 160/87 136/87  Pulse: 70 79  Resp: 18 18  Temp: 98 F (36.7 C) 98.4 F (36.9 C)   SpO2: 98% 99%     Intake/Output Summary (Last 24 hours) at 10/04/2023 2956 Last data filed at 10/04/2023 0630 Gross per 24 hour  Intake 480 ml  Output --  Net 480 ml     REVIEW OF SYSTEMS:   Constitutional: Denies fevers, chills or abnormal night sweats Eyes: Denies blurriness of vision, double vision or watery eyes Ears, nose, mouth, throat, and face: Denies mucositis or sore throat Respiratory: Denies cough, dyspnea or wheezes Cardiovascular: Denies palpitation, chest discomfort or lower extremity swelling Gastrointestinal:  Denies nausea, heartburn or change in bowel habits Skin: Denies abnormal skin rashes Lymphatics: Denies new lymphadenopathy or easy bruising Neurological: Denies numbness, tingling or new weaknesses Behavioral/Psych: Mood is stable, no new changes  All other systems were reviewed with the patient and are negative.  PHYSICAL EXAMINATION: ECOG PERFORMANCE STATUS: 2 - Symptomatic, <50% confined to bed  Vitals:   10/03/23 2100 10/04/23 0520  BP: (!) 160/87 136/87  Pulse: 70 79  Resp: 18 18  Temp: 98 F (36.7 C) 98.4 F (36.9 C)  SpO2: 98% 99%   Filed Weights   10/01/23 1351  Weight: 105 lb 13.1 oz (48 kg)    GENERAL: alert, no distress and comfortable SKIN: skin color, texture, turgor are normal, no rashes or significant lesions EYES: normal, conjunctiva are pink and non-injected, sclera clear OROPHARYNX: no exudate, no erythema and lips, buccal mucosa, and tongue normal  NECK: supple, thyroid normal size, non-tender, without nodularity LYMPH: no palpable lymphadenopathy in the cervical, axillary or inguinal LUNGS: clear to auscultation  and percussion with normal breathing effort HEART: regular rate & rhythm and no murmurs and no lower extremity edema ABDOMEN: abdomen soft, non-tender and normal bowel sounds MUSCULOSKELETAL: no cyanosis of digits and no clubbing  PSYCH: alert & oriented x 3 with fluent speech NEURO: no focal motor/sensory  deficits   All questions were answered. The patient knows to call the clinic with any problems, questions or concerns.   The total time spent in the appointment was 40 minutes encounter with patient including review of chart and various tests results, discussions about plan of care and coordination of care plan  Dawson Bills, NP 10/04/2023 9:18 AM    Labs Reviewed:  Lab Results  Component Value Date   WBC 18.3 (H) 10/03/2023   HGB 14.5 10/03/2023   HCT 42.9 10/03/2023   MCV 88.1 10/03/2023   PLT 428 (H) 10/03/2023   Recent Labs    09/18/23 0620 09/19/23 0405 09/22/23 1359 10/01/23 1419 10/02/23 0542 10/03/23 0344  NA 135   < > 136 137 134* 135  K 4.3   < > 3.3* 3.5 4.5 4.2  CL 103   < > 100 100 101 101  CO2 19*   < > 32 25 25 22   GLUCOSE 116*   < > 93 131* 139* 124*  BUN 8   < > 11 12 8  21*  CREATININE 0.71   < > 0.70 0.87 0.85 0.45  CALCIUM 9.2   < > 9.7 9.5 9.4 10.1  GFRNONAA >60   < > >60 >60 >60 >60  PROT 7.2  --  7.0 7.1 6.7  --   ALBUMIN 3.6  --  4.0 3.6 3.2*  --   AST 23  --  17 20 22   --   ALT 13  --  13 14 14   --   ALKPHOS 98  --  85 84 91  --   BILITOT 0.8  --  0.4 0.3 0.7  --   BILIDIR <0.1  --   --   --   --   --   IBILI NOT CALCULATED  --   --   --   --   --    < > = values in this interval not displayed.    Studies Reviewed:  EEG adult Result Date: 10/04/2023 Charlsie Quest, MD     10/04/2023  8:42 AM Patient Name: Regina [name] MRN: [id number] Epilepsy Attending: Charlsie Quest Referring Physician/Provider: Rolly Salter, MD Date: 10/04/2023 Duration: 23.15 mins Patient history: 58yo F with ams. EEG to evaluate for seizure Level of alertness: Awake, asleep AEDs during EEG study: LEV Technical aspects: This EEG study was done with scalp electrodes positioned according to the 10-20 International system of electrode placement. Electrical activity was reviewed with band pass filter of 1-70Hz , sensitivity of 7 uV/mm, display speed of 74mm/sec with a  60Hz  notched filter applied as appropriate. EEG data were recorded continuously and digitally stored.  Video monitoring was available and reviewed as appropriate. Description: The posterior dominant rhythm consists of 7.5Hz  predominantly in posterior head regions, symmetric and reactive to eye opening and eye closing. Sleep was characterized by vertex waves, sleep spindles (12 to 14 Hz), maximal frontocentral region. EEG showed continuous 3 to 6 Hz theta-delta slowing in left hemisphere, maximal left fronto-temporal region. Hyperventilation and photic stimulation were not performed.   ABNORMALITY - Continuous slow,  left hemisphere, maximal left fronto-temporal region IMPRESSION: This study is suggestive of cortical dysfunction arising  from  left hemisphere, maximal left fronto-temporal region likely secondary to underlying structural abnormality. No seizures or epileptiform discharges were seen throughout the recording. Charlsie Quest   MR Brain W and Wo Contrast Result Date: 10/01/2023 CLINICAL DATA:  New onset right arm weakness and progressive aphasia. Metastatic lung cancer. EXAM: MRI HEAD WITHOUT AND WITH CONTRAST TECHNIQUE: Multiplanar, multiecho pulse sequences of the brain and surrounding structures were obtained without and with intravenous contrast. CONTRAST:  5mL GADAVIST GADOBUTROL 1 MMOL/ML IV SOLN COMPARISON:  MR head without and with contrast 09/17/2023 FINDINGS: Brain: The enhancing mass in the left frontal lobe is increased in size over the last 14 days. It now measures 3.2 x 3.4 x 2.5 cm. Surrounding vasogenic edema in the left hemisphere has increased. Progressive mass effect and midline shift is present, now measuring 8 mm. No additional sites of enhancement are present. Periventricular and scattered subcortical T2 hyperintensities bilaterally are otherwise stable. Brainstem and cerebellum are unremarkable. Vascular: Flow is present in the major intracranial arteries. Skull and upper  cervical spine: The globes and orbits are within normal limits. Sinuses/Orbits: Right mastoid effusion is present. No obstructing nasopharyngeal lesion is present. The paranasal sinuses are otherwise clear. The globes and orbits are within normal limits. IMPRESSION: 1. Interval increase in size of the enhancing metastatic lesion in the left frontal lobe over the last 14 days. It now measures 3.2 x 3.4 x 2.5 cm. 2. Progressive mass effect and midline shift, now measuring 8 mm. 3. No acute infarct. Lesion demonstrates restricted diffusion consistent with a high-grade tumor. 4. Right mastoid effusion. No obstructing nasopharyngeal lesion is present. Electronically Signed   By: Marin Roberts M.D.   On: 10/01/2023 19:51   DG Humerus Right Result Date: 10/01/2023 CLINICAL DATA:  [id number] Pain 144615.  Right humerus pain. EXAM: RIGHT HUMERUS - 2+ VIEW COMPARISON:  09/17/2023. FINDINGS: There is an approximately 6.6 cm long segment of the mid diaphysis of the right humerus which exhibits significant bone destruction/resorption with permeative pattern. Findings are compatible with pathological fracture. There is interval progression of bone destruction when compared to the prior exam. No other acute fracture or dislocation. No aggressive osseous lesion. Mild degenerative changes of imaged joints. No radiopaque foreign bodies. Soft tissues are within normal limits. IMPRESSION: *Interval progression of pathological fracture of mid diaphysis of the right humerus. *Otherwise no acute fracture or dislocation. Electronically Signed   By: Jules Schick M.D.   On: 10/01/2023 18:41   DG Chest Portable 1 View Result Date: 10/01/2023 CLINICAL DATA:  Altered mental status. EXAM: PORTABLE CHEST 1 VIEW COMPARISON:  Radiograph and chest CT scan from 09/17/2023. FINDINGS: Bilateral lungs appear hyperexpanded and hyperlucent with coarse bronchovascular markings, in keeping with COPD. There are bibasilar reticular opacities, which  correlates with honeycombing on prior CT scan. There are nonspecific opacities predominantly in the bilateral upper lung zones, laterally, which corresponds to pleuroparenchymal disease. No acute lung consolidation or lung collapse. Bilateral lateral costophrenic angles are clear. Stable cardio-mediastinal silhouette. No acute osseous abnormalities. The soft tissues are within normal limits. IMPRESSION: 1. No acute cardiopulmonary abnormality. 2. Redemonstration of chronic lung parenchymal changes, as described above. Electronically Signed   By: Jules Schick M.D.   On: 10/01/2023 16:34   [place] CORE BIOPSY (SOFT TISSUE) Result Date: 09/20/2023 INDICATION: [id number] Soft tissue mass 147829. History of lung mass with RIGHT upper extremity pathologic fracture EXAM: ULTRASOUND-GUIDED RIGHT UPPER EXTREMITY SOFT TISSUE MASS BIOPSY COMPARISON:  RIGHT upper extremity XRs and CT CAP,  09/17/2023. MEDICATIONS: None ANESTHESIA/SEDATION: Moderate (conscious) sedation was employed during this procedure. A total of Versed 2 mg and Fentanyl 100 mcg was administered intravenously. Moderate Sedation Time: 14 minutes. The patient's level of consciousness and vital signs were monitored continuously by radiology nursing throughout the procedure under my direct supervision. COMPLICATIONS: None immediate. TECHNIQUE: Informed written consent was obtained from the patient and/or patient's representative after a discussion of the risks, benefits and alternatives to treatment. Questions regarding the procedure were encouraged and answered. Initial ultrasound scanning demonstrated hypervascular soft tissue mass about upper extremity pathologic fracture. An ultrasound image was saved for documentation purposes. The procedure was planned. A timeout was performed prior to the initiation of the procedure. The operative was prepped and draped in the usual sterile fashion, and a sterile drape was applied covering the operative field. A timeout was  performed prior to the initiation of the procedure. Local anesthesia was provided with 1% lidocaine with epinephrine. Under direct ultrasound guidance, an 18 gauge core needle device was utilized to obtain to obtain 4 core needle biopsies of the RIGHT upper extremity mass. The samples were placed in saline and submitted to pathology. The needle was removed and superficial hemostasis was achieved with manual compression. Post procedure scan was negative for significant hematoma. A dressing was applied. The patient tolerated the procedure well without immediate postprocedural complication. IMPRESSION: Successful ultrasound guided biopsy of a RIGHT extremity soft tissue mass, about the pathologic fracture. Roanna Banning, MD [facility] Electronically Signed   By: Roanna Banning M.D.   On: 09/20/2023 20:10   EEG adult Result Date: 09/18/2023 Charlsie Quest, MD     09/18/2023  8:44 AM Patient Name: Jazmine Heckman MRN: [id number] Epilepsy Attending: Charlsie Quest Referring Physician/Provider: Milon Dikes, MD Date: 09/17/2023 Duration: 22.31 mins Patient history: 58 y.o. female with medical history significant for COPD, depression, anxiety, and hypertension, presenting to the emergency department with aphasia, right sided weakness, and right arm pain. EEG to evaluate for seizure Level of alertness: Awake AEDs during EEG study: LEV Technical aspects: This EEG study was done with scalp electrodes positioned according to the 10-20 International system of electrode placement. Electrical activity was reviewed with band pass filter of 1-70Hz , sensitivity of 7 uV/mm, display speed of 40mm/sec with a 60Hz  notched filter applied as appropriate. EEG data were recorded continuously and digitally stored.  Video monitoring was available and reviewed as appropriate. Description: The posterior dominant rhythm consists of 8 Hz activity of moderate voltage (25-35 uV) seen  predominantly in posterior head regions, symmetric and reactive to eye opening and eye closing. EEG showed continuous polymorphic 3 to 6 Hz theta-delta slowing in left fronto-temporal region. Hyperventilation and photic stimulation were not performed.   ABNORMALITY - Continuous slow, left fronto-temporal IMPRESSION: This study is suggestive of cortical dysfunction arising from left fronto-temporal region likely secondary to underlying structural abnormality. No seizures or epileptiform discharges were seen throughout the recording. Charlsie Quest   MR BRAIN W WO CONTRAST Result Date: 09/18/2023 CLINICAL DATA:  Initial evaluation for intracranial mass. EXAM: MRI HEAD WITHOUT AND WITH CONTRAST TECHNIQUE: Multiplanar, multiecho pulse sequences of the brain and surrounding structures were obtained without and with intravenous contrast. CONTRAST:  5mL GADAVIST GADOBUTROL 1 MMOL/ML IV SOLN COMPARISON:  Prior CTs from earlier the same day. FINDINGS: Brain: Cerebral volume within normal limits. Patchy T2/FLAIR hyperintensity involving the periventricular deep white matter both cerebral hemispheres, consistent with chronic small vessel ischemic disease, moderately advanced  in nature. No evidence for acute or subacute ischemia. No areas of chronic cortical infarction. No acute or chronic intracranial blood products. Round mass measuring 2.8 x 2.6 x 2.0 cm seen centered at the subcortical left frontal lobe (series 17, image 36). Lesion demonstrates avid peripheral postcontrast enhancement. Extensive T2/FLAIR signal intensity throughout the adjacent left frontal lobe, consistent with vasogenic edema and/or possible infiltrating nonenhancing tumor. Partial effacement of the left lateral ventricle with 9 mm of left-to-right shift. No hydrocephalus or trapping. Finding is nonspecific, and could reflect a primary CNS neoplasm versus solitary intracranial metastasis. No other mass lesion or abnormal enhancement. Pituitary gland  suprasellar region within normal limits. Vascular: Major intracranial vascular flow voids are maintained. Skull and upper cervical spine: Craniocervical ungual limits. Bone marrow signal intensity normal. No scalp soft tissue abnormality. Sinuses/Orbits: Globes orbital soft tissues within normal limits. Paranasal sinuses are largely clear. No significant mastoid effusion. Other: None. IMPRESSION: 1. 2.8 x 2.6 x 2.0 cm enhancing mass centered at the subcortical left frontal lobe, with extensive surrounding vasogenic edema and 9 mm of left-to-right shift. Primary differential considerations include a solitary intracranial metastasis versus primary CNS neoplasm. 2. Underlying moderately advanced chronic microvascular ischemic disease. Electronically Signed   By: Rise Mu M.D.   On: 09/18/2023 00:51   DG Humerus Right Result Date: 09/17/2023 CLINICAL DATA:  Fall, right arm deformity EXAM: RIGHT HUMERUS - 2+ VIEW COMPARISON:  None Available. 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 Mild degenerative changes of the right shoulder. 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 Electronically Signed   By: Charline Bills M.D.   On: 09/17/2023 21:40   CT CHEST ABDOMEN PELVIS W CONTRAST Result Date: 09/17/2023 CLINICAL DATA:  Left frontal lobe mass, possible metastatic lesion, assessment for other metastatic disease or primary. * Tracking Code: BO * EXAM: CT CHEST, ABDOMEN, AND PELVIS WITH CONTRAST TECHNIQUE: Multidetector CT imaging of the chest, abdomen and pelvis was performed following the standard protocol during bolus administration of intravenous contrast. RADIATION DOSE REDUCTION: This exam was performed according to the departmental dose-optimization program which includes automated exposure control, adjustment of the mA and/or kV according to patient size and/or use of iterative  reconstruction technique. CONTRAST:  60mL OMNIPAQUE IOHEXOL 350 MG/ML SOLN COMPARISON:  05/25/2023 FINDINGS: CT CHEST FINDINGS Cardiovascular: Mild atheromatous vascular calcification of the thoracic aorta. Mediastinum/Nodes: Pathologic right supraclavicular, right paratracheal, bilateral hilar, subcarinal, and bilateral infrahilar adenopathy. Index right paratracheal node 1.4 cm in short axis on image 17 series 3. Index left hilar node 1.3 cm in short axis on image 26 series 3. Lungs/Pleura: Substantial biapical pleuroparenchymal scarring. This has an asymmetric nodular component in the right upper lobe measuring 2.9 by 1.5 by 1.8 cm (volume = 4.1 cm^3) which could be from scarring or tumor. Extensive emphysema.  Honeycombing at the lung bases. Musculoskeletal: Unremarkable CT ABDOMEN PELVIS FINDINGS Hepatobiliary: Unremarkable Pancreas: Unremarkable Spleen: Unremarkable Adrenals/Urinary Tract: There is dense contrast in the renal collecting systems, ureters, and urinary bladder due to recent injection for the CT angiogram. This lowers sensitivity for nonobstructive calculi. No renal or adrenal mass. No obvious abnormal filling defect along the urothelium. Stomach/Bowel: Mild prominence of stool in the rectal vault. Normal appendix. Otherwise unremarkable. Vascular/Lymphatic: Atherosclerosis is present, including aortoiliac atherosclerotic disease. Reproductive: Posterior positioning of the uterus with suspected uterine fibroids and associated calcifications. Otherwise unremarkable. Other: No supplemental non-categorized findings. Musculoskeletal: Unremarkable IMPRESSION: 1. Pathologic  right supraclavicular, right paratracheal, bilateral hilar, subcarinal, and bilateral infrahilar adenopathy. 2. Substantial biapical pleuroparenchymal scarring. This has an asymmetric nodular component in the right upper lobe measuring 2.9 by 1.5 by 1.8 cm (volume = 4.1 cm^ 3) which could be from scarring or tumor. PET-CT could help  differentiate. 3. Extensive emphysema. 4. Honeycombing at the lung bases, compatible with interstitial lung disease. 5. Mild prominence of stool in the rectal vault. 6. Posterior positioning of the uterus with suspected uterine fibroids and associated calcifications. 7. Aortic atherosclerosis. Aortic Atherosclerosis (ICD10-I70.0) and Emphysema (ICD10-J43.9). Electronically Signed   By: Gaylyn Rong M.D.   On: 09/17/2023 19:23   CT ANGIO HEAD NECK W WO CM (CODE STROKE) Addendum Date: 09/17/2023 ADDENDUM REPORT: 09/17/2023 19:02 ADDENDUM: Delayed images demonstrate peripheral enhancement the mass measuring 2.6 x 2.3 x 2.1 cm. Other foci of enhancement are present. This most likely represents a solitary metastasis. Electronically Signed   By: Marin Roberts M.D.   On: 09/17/2023 19:02   Result Date: 09/17/2023 CLINICAL DATA:  Code stroke.  Left frontal lobe mass. EXAM: CT ANGIOGRAPHY HEAD AND NECK WITH AND WITHOUT CONTRAST TECHNIQUE: Multidetector CT imaging of the head and neck was performed using the standard protocol during bolus administration of intravenous contrast. Multiplanar CT image reconstructions and MIPs were obtained to evaluate the vascular anatomy. Carotid stenosis measurements (when applicable) are obtained utilizing NASCET criteria, using the distal internal carotid diameter as the denominator. RADIATION DOSE REDUCTION: This exam was performed according to the departmental dose-optimization program which includes automated exposure control, adjustment of the mA and/or kV according to patient size and/or use of iterative reconstruction technique. CONTRAST:  75mL OMNIPAQUE IOHEXOL 350 MG/ML SOLN COMPARISON:  CT head without contrast 09/17/2023 FINDINGS: CTA NECK FINDINGS Aortic arch: A 3 vessel arch configuration is present. The aortic arch is incompletely imaged on this study. No significant focal stenosis is present the great vessel origins. Right carotid system: Atherosclerotic  calcifications are present at the right carotid bifurcation predominantly involving the external carotid artery. The right common carotid artery is normal. The cervical right ICA is otherwise normal. Left carotid system: The left common carotid artery is within normal limits. Mild noncalcified plaque is present posteriorly at the bifurcation without significant stenosis. The cervical left ICA is otherwise normal. Vertebral arteries: The left vertebral artery is dominant vessel. Both vertebral arteries originate from the subclavian arteries without significant stenosis. No significant stenosis is present in either vertebral artery in the neck. Skeleton: Vertebral body heights and alignment are normal. No focal osseous lesions are present. The patient is edentulous. Other neck: Soft tissues the neck are otherwise unremarkable. Salivary glands are within normal limits. Thyroid is normal. No significant adenopathy is present. No focal mucosal or submucosal lesions are present. Upper chest: Central and paraseptal emphysematous changes are present. Peripheral lung nodules are present in the right upper lobe. Please see dedicated CT of the chest for further description. No pneumothorax is present. Paratracheal adenopathy is present. Review of the MIP images confirms the above findings CTA HEAD FINDINGS Anterior circulation: Internal carotid arteries are within normal limits the skull base to the ICA termini. The A1 and M1 segments are normal. Anterior communicating artery is patent. Mass effect deviates the left MCA and bilateral ACA branch vessels. No significant proximal stenosis or occlusion is present. Posterior circulation: The PICA origins are visualized and normal. Scratched at the left PICA origin is visualized and normal. The right PICA scratched at the hypoplastic right vertebral artery terminates  at the PICA. The basilar artery is normal. The superior cerebellar arteries are patent. Posterior cerebral arteries  originate from basilar tip. A right posterior communicating artery contributes. The PCA branch vessels are normal bilaterally. No aneurysm is present. Venous sinuses: The dural sinuses are patent. The straight sinus and deep cerebral veins are intact. Cortical veins are within normal limits. No significant vascular malformation is evident. Anatomic variants: None Review of the MIP images confirms the above findings IMPRESSION: 1. No emergent large vessel occlusion. 2. Mass effect deviates the left MCA and bilateral ACA branch vessels without significant proximal stenosis or occlusion. 3. Mild atherosclerotic changes at the carotid bifurcations bilaterally without significant stenosis. 4. Peripheral lung nodules in the right upper lobe. Please see dedicated CT of the chest for further description. 5. Paratracheal adenopathy is related to metastatic disease. Please see dedicated CT of the chest for further description. 6.  Emphysema (ICD10-J43.9). Electronically Signed: By: Marin Roberts M.D. On: 09/17/2023 18:53   CT HEAD CODE STROKE WO CONTRAST Result Date: 09/17/2023 CLINICAL DATA:  Code stroke. Neuro deficit, acute, stroke suspected. EXAM: CT HEAD WITHOUT CONTRAST TECHNIQUE: Contiguous axial images were obtained from the base of the skull through the vertex without intravenous contrast. RADIATION DOSE REDUCTION: This exam was performed according to the departmental dose-optimization program which includes automated exposure control, adjustment of the mA and/or kV according to patient size and/or use of iterative reconstruction technique. COMPARISON:  None Available. FINDINGS: Brain: A left frontal lobe mass measures 20 x 20 x 18 mm. Extensive surrounding vasogenic edema present throughout the left frontal lobe. Mass effect is present with effacement of the sulci and 8 mm of left to right midline shift. Asymmetric effacement of the left lateral ventricle is noted. No other discrete lesions are present. No  acute cortical infarcts are present. The brainstem and cerebellum are within normal limits. Midline structures are within normal limits. Vascular: No hyperdense vessel or unexpected calcification. Skull: Calvarium is intact. No focal lytic or blastic lesions are present. No significant extracranial soft tissue lesion is present. Sinuses/Orbits: The paranasal sinuses and mastoid air cells are clear. The globes and orbits are within normal limits. IMPRESSION: 1. 20 x 20 x 18 mm left frontal lobe mass with extensive surrounding vasogenic edema. This is concerning for a primary or metastatic neoplasm. Recommend MRI the brain without and with contrast for further evaluation. 2. Mass effect with effacement of the sulci and 8 mm of left to right midline shift. 3. No other discrete lesions are present. 4. No other acute intracranial abnormality. The above was relayed via text pager to Dr. Milon Dikes on 09/17/2023 at 18:25 . Electronically Signed   By: Marin Roberts M.D.   On: 09/17/2023 18:25   DG Chest Portable 1 View Result Date: 09/17/2023 CLINICAL DATA:  Tachycardia. EXAM: PORTABLE CHEST 1 VIEW COMPARISON:  Radiograph 05/25/2023 FINDINGS: The heart is normal in size. Stable mediastinal contours. Chronic hyperinflation and emphysema. There is biapical pleuroparenchymal scarring, similar. Reticular opacities at both lung bases are without significant interval change, typical of honeycombing. No definite acute airspace disease. No features of pulmonary edema. No pneumothorax or significant pleural effusion. IMPRESSION: 1. Chronic hyperinflation and emphysema. 2. Bibasilar reticular opacities, typical of honeycombing, without significant interval change. 3. Biapical pleuroparenchymal scarring. Electronically Signed   By: Narda Rutherford M.D.   On: 09/17/2023 18:02

## 2023-10-05 ENCOUNTER — Ambulatory Visit
Admit: 2023-10-05 | Discharge: 2023-10-05 | Disposition: A | Discharge status: Home / Self Care | Payer: 59 | Attending: Radiation Oncology | Admitting: Radiation Oncology

## 2023-10-05 ENCOUNTER — Other Ambulatory Visit: Payer: Self-pay

## 2023-10-05 ENCOUNTER — Encounter (HOSPITAL_COMMUNITY): Payer: Self-pay | Admitting: Anesthesiology

## 2023-10-05 ENCOUNTER — Ambulatory Visit: Payer: 59 | Admitting: Orthopedic Surgery

## 2023-10-05 DIAGNOSIS — C3411 Malignant neoplasm of upper lobe, right bronchus or lung: Secondary | ICD-10-CM | POA: Diagnosis not present

## 2023-10-05 DIAGNOSIS — C7931 Secondary malignant neoplasm of brain: Secondary | ICD-10-CM

## 2023-10-05 LAB — RAD ONC ARIA SESSION SUMMARY
Course Elapsed Days: 1
Plan Fractions Treated to Date: 2
Plan Prescribed Dose Per Fraction: 8 Gy
Plan Total Fractions Prescribed: 3
Plan Total Prescribed Dose: 24 Gy
Reference Point Dosage Given to Date: 16 Gy
Reference Point Session Dosage Given: 8 Gy
Session Number: 2

## 2023-10-05 LAB — BASIC METABOLIC PANEL
Anion gap: 9 (ref 5–15)
BUN: 24 mg/dL — ABNORMAL HIGH (ref 6–20)
CO2: 25 mmol/L (ref 22–32)
Calcium: 9.7 mg/dL (ref 8.9–10.3)
Chloride: 99 mmol/L (ref 98–111)
Creatinine, Ser: 0.58 mg/dL (ref 0.44–1.00)
GFR, Estimated: >60 mL/min (ref >60)
Glucose, Bld: 144 mg/dL — ABNORMAL HIGH (ref 70–99)
Potassium: 4 mmol/L (ref 3.5–5.1)
Sodium: 133 mmol/L — ABNORMAL LOW (ref 135–145)

## 2023-10-05 LAB — CBC
HCT: 44.4 % (ref 36.0–46.0)
Hemoglobin: 15 g/dL (ref 12.0–15.0)
MCH: 29.7 pg (ref 26.0–34.0)
MCHC: 33.8 g/dL (ref 30.0–36.0)
MCV: 87.9 fL (ref 80.0–100.0)
Platelets: 435 10*3/uL — ABNORMAL HIGH (ref 150–400)
RBC: 5.05 MIL/uL (ref 3.87–5.11)
RDW: 14.4 % (ref 11.5–15.5)
WBC: 17.2 10*3/uL — ABNORMAL HIGH (ref 4.0–10.5)
nRBC: 0 % (ref 0.0–0.2)

## 2023-10-05 MED ORDER — LORATADINE 10 MG PO TABS
10.0000 mg | ORAL_TABLET | Freq: Every day | ORAL | Status: DC
  Administered 2023-10-05 – 2023-10-13 (×7): 10 mg via ORAL
  Filled 2023-10-05 (×8): qty 1

## 2023-10-05 NOTE — Evaluation (Signed)
SLP Cancellation Note  Patient Details Name: Regina [name] MRN: [id number] DOB: May 25, 1966   Cancelled treatment:       Reason Eval/Treat Not Completed: Other (comment) (Reviewed chart and noted pt has brain surgery planned this week for her mass, will defer SLE until after surgery as expect presentation to be different, Messaged RN who was agreeable to plan.)  [name], MS [facility] SLP Acute Rehab Services Office [phone]  Chales Abrahams 10/05/2023, 3:30 PM

## 2023-10-05 NOTE — Progress Notes (Addendum)
  Radiation Oncology         [phone] ________________________________   INPATIENT  Stereotactic Treatment Procedure Note  Name: Frederica Chrestman MRN: [id number]  Date: 10/04/2023  DOB: 1966/03/13  SPECIAL TREATMENT PROCEDURE    ICD-10-CM   1. Metastasis to brain [facility])  C79.31       3D TREATMENT PLANNING AND DOSIMETRY:  The patient's radiation plan was reviewed and approved by neurosurgery and radiation oncology prior to treatment.  It showed 3-dimensional radiation distributions overlaid onto the planning CT/MRI image set.  The [facility] for the target structures as well as the organs at risk were reviewed. The documentation of the 3D plan and dosimetry are filed in the radiation oncology EMR.  NARRATIVE:  Demitra Danley was brought to the TrueBeam stereotactic radiation treatment machine and placed supine on the CT couch. The head frame was applied, and the patient was set up for stereotactic radiosurgery.  Neurosurgery was present for the set-up and delivery  SIMULATION VERIFICATION:  In the couch zero-angle position, the patient underwent Exactrac imaging using the Brainlab system with orthogonal KV images.  These were carefully aligned and repeated to confirm treatment position for each of the isocenters.  The Exactrac snap film verification was repeated at each couch angle.  PROCEDURE: Wynona Canes received stereotactic radiosurgery to the following targets: Left Frontal 3.4 cm target was treated using 5 Rapid Arc VMAT Beams to a prescription dose of 8 Gy to be given 3 times for a total of 24 Gy.  ExacTrac registration was performed for each couch angle.  The 100% isodose line was prescribed.  6 MV X-rays were delivered in the flattening filter free beam mode.   STEREOTACTIC TREATMENT MANAGEMENT:  Following delivery, the patient was transported to nursing in stable condition and monitored for possible acute effects.  Vital signs were recorded LMP 06/06/2010 . The patient tolerated  treatment without significant acute effects, and was discharged to home in stable condition.    PLAN: Complete 3 fractions this week, then resection Friday and follow-up in one month.  ________________________________  Artist Pais. Kathrynn Running, M.D.

## 2023-10-05 NOTE — Progress Notes (Signed)
  Progress Note   Patient: Regina [name] WGN:[id number] DOB: [birth year] DOA: 10/01/2023     4 DOS: the patient was seen and examined on 10/05/2023 at 10:55AM      Brief hospital course: 58 y.o. F with metastatic lung CA, here for radiation, brain tumor resection and humerus fixation.    See longer note from 1/28     Assessment and Plan: * Lung Ca Brain metastasis - Continue radiation therapy today and tomorrow afternoon - After radiation tomorrow, transfer to [facility] for craniotomy by [name] on Friday morning - Continue Keppra and Dexamethasone   Metastasis to bone Pathologic humerus fracture - NWB RUE - Sling - Post-surgery, re-engage Orthopedics (Dr. Christell Constant) for IM nailing  COPD (chronic obstructive pulmonary disease) (HCC) No active flare, not on medication at home  HTN (hypertension) - Continue new amlodipine          Subjective: Patient is frustrated about being in the hospital, as she is supposed to care for her brother who has mental disability     Physical Exam: BP 135/76 (BP Location: Left Arm)   Pulse 82   Temp 97.9 F (36.6 C) (Oral)   Resp 18   Ht 5\' 6"  (1.676 m)   Wt 48 kg   LMP 06/06/2010   SpO2 97%   BMI 17.08 kg/m   Thin adult female, ambulating in room, interactive RRR, no murmurs, no peripheral edema Respiratory rate normal, lungs clear without rales or wheezes Attention normal, affect irritable, speaks fluently but with several word finding difficulties.  Overall very difficult to understand, but seems to have fairly good receptive comprehension.  Right arm not tested due to sling, left arm and legs both 5 of 5 strength     Data Reviewed: Discussed with orthopedics Basic metabolic panel shows mild hyponatremia, normal renal function CBC shows white count 17  Family Communication: Niece by phone    Disposition: Status is: Inpatient Patient was readmitted due to aphasia and inability to coordinate complex outpatient  management of her metastatic lung cancer   She will stay here for radiation therapy this week then transfer to [facility] Friday for surgical resection   After surgery, she will need PT to evaluate SNF vs Home.     IMPORTANT: at the time of discharge home, she cannot drive (humerus fracture), she lives with a son who works and cannot provide transportation and she cannot speak on the phone to coordinate transportation (aphasia)    If she has multiple outpatient follow ups, this will all need to be arranged for her carefully          Author: Alberteen Sam, MD 10/05/2023 4:12 PM  For on call review [email or web address].

## 2023-10-06 ENCOUNTER — Other Ambulatory Visit: Payer: 59

## 2023-10-06 ENCOUNTER — Other Ambulatory Visit: Payer: Self-pay

## 2023-10-06 ENCOUNTER — Ambulatory Visit
Admit: 2023-10-06 | Discharge: 2023-10-06 | Disposition: A | Discharge status: Home / Self Care | Payer: 59 | Attending: Radiation Oncology | Admitting: Radiation Oncology

## 2023-10-06 ENCOUNTER — Ambulatory Visit: Payer: 59 | Admitting: Radiation Oncology

## 2023-10-06 ENCOUNTER — Encounter (HOSPITAL_COMMUNITY)
Admission: EM | Disposition: A | Discharge status: Home / Self Care | Payer: Self-pay | Source: Home / Self Care | Attending: Neurosurgery

## 2023-10-06 ENCOUNTER — Ambulatory Visit: Payer: 59 | Admitting: Internal Medicine

## 2023-10-06 DIAGNOSIS — C3411 Malignant neoplasm of upper lobe, right bronchus or lung: Secondary | ICD-10-CM | POA: Diagnosis not present

## 2023-10-06 DIAGNOSIS — C7931 Secondary malignant neoplasm of brain: Secondary | ICD-10-CM

## 2023-10-06 LAB — RAD ONC ARIA SESSION SUMMARY
Course Elapsed Days: 2
Plan Fractions Treated to Date: 3
Plan Prescribed Dose Per Fraction: 8 Gy
Plan Total Fractions Prescribed: 3
Plan Total Prescribed Dose: 24 Gy
Reference Point Dosage Given to Date: 24 Gy
Reference Point Session Dosage Given: 8 Gy
Session Number: 3

## 2023-10-06 SURGERY — INTRAMEDULLARY (IM) NAIL HUMERAL
Anesthesia: General | Laterality: Right

## 2023-10-06 MED ORDER — FLUCONAZOLE 150 MG PO TABS
150.0000 mg | ORAL_TABLET | Freq: Once | ORAL | Status: AC
  Administered 2023-10-06: 150 mg via ORAL
  Filled 2023-10-06: qty 1

## 2023-10-06 NOTE — Progress Notes (Signed)
Called [facility] to arrange for transport to [facility] for surgery today. [facility] to bring patient to short stay at [facility] at 1300

## 2023-10-06 NOTE — Progress Notes (Signed)
  Radiation Oncology         [phone] ________________________________  Name: Regina [name] MRN: [id number]  Date: 10/06/2023  DOB: 04-04-1966  Chart Note:  Patient completed pre-op SRS radiation today, now due to transfer to [facility] for left frontal craniotomy with [name] tomorrow morning.  Assuming the Craniotomy and recovery uneventful with good recovery, she is scheduled for IM nail of the right humerus Monday.  With the resolution of her brain met and right humerus fracture, it is hoped that she can regain independence for discharge to home, next week.  ________________________________  Regina [name], M.D.

## 2023-10-06 NOTE — Anesthesia Preprocedure Evaluation (Addendum)
Anesthesia Evaluation  Patient identified by MRN, date of birth, ID band Patient awake    Reviewed: Allergy & Precautions, NPO status , Patient's Chart, lab work & pertinent test results  History of Anesthesia Complications Negative for: history of anesthetic complications  Airway Mallampati: II  TM Distance: >3 FB Neck ROM: Full    Dental  (+) Edentulous Upper, Edentulous Lower   Pulmonary COPD, former smoker   breath sounds clear to auscultation       Cardiovascular hypertension, Pt. on medications (-) angina  Rhythm:Regular Rate:Normal     Neuro/Psych   Anxiety Depression    Frontal brain tumor    GI/Hepatic negative GI ROS,,,(+)     substance abuse  alcohol use and marijuana use  Endo/Other  negative endocrine ROS    Renal/GU negative Renal ROS     Musculoskeletal   Abdominal   Peds  Hematology   Anesthesia Other Findings   Reproductive/Obstetrics                             Anesthesia Physical Anesthesia Plan  ASA: 3  Anesthesia Plan: General   Post-op Pain Management: Tylenol PO (pre-op)*   Induction: Intravenous  PONV Risk Score and Plan: 3 and Dexamethasone, Ondansetron and Treatment may vary due to age or medical condition  Airway Management Planned: Oral ETT  Additional Equipment: Arterial line  Intra-op Plan:   Post-operative Plan: Extubation in OR  Informed Consent: I have reviewed the patients History and Physical, chart, labs and discussed the procedure including the risks, benefits and alternatives for the proposed anesthesia with the patient or authorized representative who has indicated his/her understanding and acceptance.     Dental advisory given  Plan Discussed with: CRNA and Surgeon  Anesthesia Plan Comments:         Anesthesia Quick Evaluation

## 2023-10-06 NOTE — Progress Notes (Signed)
PROGRESS NOTE    Regina [name]  ZOX:[id number] DOB: [birth year] DOA: 10/01/2023 PCP: [name], NP   Brief Narrative:  Regina [name] is a pleasant 58 y.o. F with hx smoking, COPD, HTN, and anxiety and recently diagnosed few weeks ago with lung cancer metastatic to brain, LN and bone (c/b pathological right humerus fracture) who was admitted for mental status changes and aphasia.   Recently admitted 1/11 to 1/14, found to have lung mass, brain mass and pathologic fracture due to right humerus met --> biopsy showed NSCLC.  Previous plan was for outpatient follow-up and treatment with oncology.  Patient referred for PET and radiation oncology but missed appointments per family admitted for worsening mental status and aphasia.    Assessment & Plan:   Principal Problem:   Primary adenocarcinoma of upper lobe of right lung (HCC) Active Problems:   HTN (hypertension)   Neoplasm causing mass effect and brain compression on adjacent structures (HCC)   Cerebral edema (HCC)   Metastasis to brain (HCC)   Metastasis to bone [facility])   Palliative care encounter   Pathological fracture of right humerus with delayed healing   Lung cancer metastatic to brain (HCC)   Vasogenic brain edema (HCC)   Counseling and coordination of care   Protein-calorie malnutrition, severe   Primary adenocarcinoma upper lobe R Lung Brain metastasis causing cerebral edema with noted brain compression due to mass effect - Continue radiation therapy today - Transfer planned after SRS by Radiation oncology at 1215 this afternoon to [facility] for craniotomy/surgical resection by [name] on Friday 31st morning - Continue Keppra and Dexamethasone - Follows with Dr. Gwenyth Bouillon in outpatient setting  Metastasis to bone Pathologic humerus fracture - NWB RUE - Sling in place, nonweightbearing as discussed - Post-surgery, re-engage Orthopedics (Dr. Christell Constant) for IM nailing -timing likely 2 weeks after  radiation per previous discussion to ensure proper wound healing   COPD (chronic obstructive pulmonary disease) (HCC) No active flare, not on medication at home   HTN (hypertension) - Continue amlodipine    Aphasia, resolved  COPD (chronic obstructive pulmonary disease) (HCC) No active flare, not on medication at home     HTN (hypertension) BP controlled - Continue amlodipine     Severe protein calorie malnutrition - Consult dietitian - Start supplements  DVT prophylaxis: SCDs Start: 10/01/23 2234   Code Status:   Code Status: Full Code  Family Communication: None present  Status is: Inpatient  Dispo: The patient is from: Home              Anticipated d/c is to: To be determined              Anticipated d/c date is: To be determined              Patient currently not medically stable for discharge  Consultants:  Oncology, radiation oncology, neurosurgery, orthopedics  Procedures:  Pending neurosurgical evaluation -tentative plan for craniotomy with surgical resection 10/07/2023  Antimicrobials:  Perioperatively  Subjective: No acute issues or events overnight, back to baseline denies nausea vomit diarrhea constipation headache fevers chills chest pain shortness of breath  Objective: Vitals:   10/04/23 2027 10/05/23 0423 10/05/23 1253 10/06/23 0425  BP: (!) 139/92 (!) 143/91 135/76 (!) 148/92  Pulse: 79 84 82 76  Resp: 20 20 18 20   Temp: 98.1 F (36.7 C) 98.3 F (36.8 C) 97.9 F (36.6 C) 98.3 F (36.8 C)  TempSrc: Oral Oral Oral Oral  SpO2:  100% 100% 97% 100%  Weight:      Height:        Intake/Output Summary (Last 24 hours) at 10/06/2023 0718 Last data filed at 10/05/2023 1023 Gross per 24 hour  Intake 240 ml  Output --  Net 240 ml   Filed Weights   10/01/23 1351  Weight: 48 kg    Examination:  General: Cachectic, no acute distress, awake alert oriented x 4. Lungs:  Clear to auscultate bilaterally without rhonchi, wheeze, or rales. Heart:   Regular rate and rhythm.  Without murmurs, rubs, or gallops. Abdomen:  Soft, nontender, nondistended.  Without guarding or rebound. Extremities: Right upper extremity sling intact. Skin:  Warm and dry, no erythema.  Data Reviewed: I have personally reviewed following labs and imaging studies  CBC: Recent Labs  Lab 10/01/23 1419 10/02/23 0542 10/03/23 0344 10/05/23 0422  WBC 9.5 9.8 18.3* 17.2*  NEUTROABS 6.0 8.7*  --   --   HGB 14.7 14.8 14.5 15.0  HCT 43.5 43.8 42.9 44.4  MCV 87.9 88.3 88.1 87.9  PLT 458* 339 428* 435*   Basic Metabolic Panel: Recent Labs  Lab 10/01/23 1419 10/02/23 0542 10/03/23 0344 10/05/23 0422  NA 137 134* 135 133*  K 3.5 4.5 4.2 4.0  CL 100 101 101 99  CO2 25 25 22 25   GLUCOSE 131* 139* 124* 144*  BUN 12 8 21* 24*  CREATININE 0.87 0.85 0.45 0.58  CALCIUM 9.5 9.4 10.1 9.7  MG  --  2.0 2.1  --    GFR: Estimated Creatinine Clearance: 57.4 mL/min (by C-G formula based on SCr of 0.58 mg/dL). Liver Function Tests: Recent Labs  Lab 10/01/23 1419 10/02/23 0542  AST 20 22  ALT 14 14  ALKPHOS 84 91  BILITOT 0.3 0.7  PROT 7.1 6.7  ALBUMIN 3.6 3.2*   No results for input(s): "LIPASE", "AMYLASE" in the last 168 hours. No results for input(s): "AMMONIA" in the last 168 hours. Coagulation Profile: No results for input(s): "INR", "PROTIME" in the last 168 hours. Cardiac Enzymes: No results for input(s): "CKTOTAL", "CKMB", "CKMBINDEX", "TROPONINI" in the last 168 hours. BNP (last 3 results) No results for input(s): "PROBNP" in the last 8760 hours. HbA1C: No results for input(s): "HGBA1C" in the last 72 hours. CBG: Recent Labs  Lab 10/01/23 1433  GLUCAP 133*   Lipid Profile: No results for input(s): "CHOL", "HDL", "LDLCALC", "TRIG", "CHOLHDL", "LDLDIRECT" in the last 72 hours. Thyroid Function Tests: No results for input(s): "TSH", "T4TOTAL", "FREET4", "T3FREE", "THYROIDAB" in the last 72 hours. Anemia Panel: No results for input(s):  "VITAMINB12", "FOLATE", "FERRITIN", "TIBC", "IRON", "RETICCTPCT" in the last 72 hours. Sepsis Labs: No results for input(s): "PROCALCITON", "LATICACIDVEN" in the last 168 hours.  No results found for this or any previous visit (from the past 240 hours).       Radiology Studies: EEG adult Result Date: 10/04/2023 Charlsie Quest, MD     10/04/2023  8:42 AM Patient Name: Regina [name] MRN: [id number] Epilepsy Attending: Charlsie Quest Referring Physician/Provider: Rolly Salter, MD Date: 10/04/2023 Duration: 23.15 mins Patient history: 58yo F with ams. EEG to evaluate for seizure Level of alertness: Awake, asleep AEDs during EEG study: LEV Technical aspects: This EEG study was done with scalp electrodes positioned according to the 10-20 International system of electrode placement. Electrical activity was reviewed with band pass filter of 1-70Hz , sensitivity of 7 uV/mm, display speed of 56mm/sec with a 60Hz  notched filter applied as appropriate. EEG data were  recorded continuously and digitally stored.  Video monitoring was available and reviewed as appropriate. Description: The posterior dominant rhythm consists of 7.5Hz  predominantly in posterior head regions, symmetric and reactive to eye opening and eye closing. Sleep was characterized by vertex waves, sleep spindles (12 to 14 Hz), maximal frontocentral region. EEG showed continuous 3 to 6 Hz theta-delta slowing in left hemisphere, maximal left fronto-temporal region. Hyperventilation and photic stimulation were not performed.   ABNORMALITY - Continuous slow,  left hemisphere, maximal left fronto-temporal region IMPRESSION: This study is suggestive of cortical dysfunction arising from  left hemisphere, maximal left fronto-temporal region likely secondary to underlying structural abnormality. No seizures or epileptiform discharges were seen throughout the recording. Priyanka Annabelle Harman   Scheduled Meds:  amLODipine  5 mg Oral Daily   dexamethasone  (DECADRON) injection  4 mg Intravenous Q6H   feeding supplement  237 mL Oral TID BM   levETIRAcetam  500 mg Oral BID   loratadine  10 mg Oral Daily   multivitamin with minerals  1 tablet Oral Daily   Continuous Infusions:   LOS: 5 days   Time spent:  Azucena Fallen, DO [facility]  If 7PM-7AM, please contact night-coverage [email or web address]  10/06/2023, 7:18 AM

## 2023-10-07 ENCOUNTER — Encounter (HOSPITAL_COMMUNITY): Payer: Self-pay | Admitting: Family Medicine

## 2023-10-07 ENCOUNTER — Encounter (HOSPITAL_COMMUNITY)
Admission: EM | Disposition: A | Discharge status: Home / Self Care | Payer: Self-pay | Source: Home / Self Care | Attending: Neurosurgery

## 2023-10-07 ENCOUNTER — Inpatient Hospital Stay (HOSPITAL_COMMUNITY): Payer: MEDICAID | Admitting: Anesthesiology

## 2023-10-07 ENCOUNTER — Other Ambulatory Visit: Payer: Self-pay

## 2023-10-07 DIAGNOSIS — C719 Malignant neoplasm of brain, unspecified: Secondary | ICD-10-CM

## 2023-10-07 DIAGNOSIS — I10 Essential (primary) hypertension: Secondary | ICD-10-CM

## 2023-10-07 DIAGNOSIS — J449 Chronic obstructive pulmonary disease, unspecified: Secondary | ICD-10-CM

## 2023-10-07 DIAGNOSIS — E43 Unspecified severe protein-calorie malnutrition: Secondary | ICD-10-CM

## 2023-10-07 DIAGNOSIS — C7931 Secondary malignant neoplasm of brain: Secondary | ICD-10-CM | POA: Diagnosis not present

## 2023-10-07 DIAGNOSIS — F418 Other specified anxiety disorders: Secondary | ICD-10-CM

## 2023-10-07 DIAGNOSIS — G060 Intracranial abscess and granuloma: Secondary | ICD-10-CM | POA: Diagnosis not present

## 2023-10-07 DIAGNOSIS — C3490 Malignant neoplasm of unspecified part of unspecified bronchus or lung: Secondary | ICD-10-CM | POA: Diagnosis not present

## 2023-10-07 DIAGNOSIS — E871 Hypo-osmolality and hyponatremia: Secondary | ICD-10-CM

## 2023-10-07 DIAGNOSIS — C3411 Malignant neoplasm of upper lobe, right bronchus or lung: Secondary | ICD-10-CM | POA: Diagnosis not present

## 2023-10-07 HISTORY — PX: APPLICATION OF CRANIAL NAVIGATION: SHX6578

## 2023-10-07 HISTORY — PX: CRANIOTOMY: SHX93

## 2023-10-07 LAB — CBC
HCT: 42.9 % (ref 36.0–46.0)
Hemoglobin: 14.5 g/dL (ref 12.0–15.0)
MCH: 29.5 pg (ref 26.0–34.0)
MCHC: 33.8 g/dL (ref 30.0–36.0)
MCV: 87.2 fL (ref 80.0–100.0)
Platelets: 394 10*3/uL (ref 150–400)
RBC: 4.92 MIL/uL (ref 3.87–5.11)
RDW: 14 % (ref 11.5–15.5)
WBC: 22.6 10*3/uL — ABNORMAL HIGH (ref 4.0–10.5)
nRBC: 0 % (ref 0.0–0.2)

## 2023-10-07 LAB — SURGICAL PCR SCREEN
MRSA, PCR: NEGATIVE
Staphylococcus aureus: POSITIVE — AB

## 2023-10-07 LAB — TYPE AND SCREEN
ABO/RH(D): O POS
ABO/RH(D): O POS
Antibody Screen: NEGATIVE
Antibody Screen: NEGATIVE

## 2023-10-07 LAB — BASIC METABOLIC PANEL
Anion gap: 12 (ref 5–15)
BUN: 18 mg/dL (ref 6–20)
CO2: 19 mmol/L — ABNORMAL LOW (ref 22–32)
Calcium: 8.7 mg/dL — ABNORMAL LOW (ref 8.9–10.3)
Chloride: 105 mmol/L (ref 98–111)
Creatinine, Ser: 0.68 mg/dL (ref 0.44–1.00)
GFR, Estimated: >60 mL/min (ref >60)
Glucose, Bld: 145 mg/dL — ABNORMAL HIGH (ref 70–99)
Potassium: 4.8 mmol/L (ref 3.5–5.1)
Sodium: 136 mmol/L (ref 135–145)

## 2023-10-07 SURGERY — CRANIOTOMY TUMOR EXCISION
Anesthesia: General | Laterality: Left

## 2023-10-07 MED ORDER — LIDOCAINE-EPINEPHRINE 0.5 %-1:200000 IJ SOLN
INTRAMUSCULAR | Status: AC
  Filled 2023-10-07: qty 50

## 2023-10-07 MED ORDER — ROCURONIUM BROMIDE 10 MG/ML (PF) SYRINGE
PREFILLED_SYRINGE | INTRAVENOUS | Status: DC | PRN
  Administered 2023-10-07: 30 mg via INTRAVENOUS
  Administered 2023-10-07: 70 mg via INTRAVENOUS
  Administered 2023-10-07: 50 mg via INTRAVENOUS

## 2023-10-07 MED ORDER — PROPOFOL 10 MG/ML IV BOLUS
INTRAVENOUS | Status: DC | PRN
  Administered 2023-10-07: 90 mg via INTRAVENOUS

## 2023-10-07 MED ORDER — PHENYLEPHRINE 80 MCG/ML (10ML) SYRINGE FOR IV PUSH (FOR BLOOD PRESSURE SUPPORT)
PREFILLED_SYRINGE | INTRAVENOUS | Status: DC | PRN
  Administered 2023-10-07 (×2): 160 ug via INTRAVENOUS
  Administered 2023-10-07: 80 ug via INTRAVENOUS
  Administered 2023-10-07: 160 ug via INTRAVENOUS
  Administered 2023-10-07: 80 ug via INTRAVENOUS
  Administered 2023-10-07: 160 ug via INTRAVENOUS

## 2023-10-07 MED ORDER — REMIFENTANIL HCL 2 MG IV SOLR
0.1000 ug/kg/min | INTRAVENOUS | Status: AC
  Administered 2023-10-07: .1 ug/kg/min via INTRAVENOUS
  Filled 2023-10-07: qty 2000

## 2023-10-07 MED ORDER — FENTANYL CITRATE (PF) 250 MCG/5ML IJ SOLN
INTRAMUSCULAR | Status: AC
  Filled 2023-10-07: qty 5

## 2023-10-07 MED ORDER — HYDROCODONE-ACETAMINOPHEN 5-325 MG PO TABS
1.0000 | ORAL_TABLET | ORAL | Status: DC | PRN
  Administered 2023-10-08 – 2023-10-13 (×8): 1 via ORAL
  Filled 2023-10-07 (×8): qty 1

## 2023-10-07 MED ORDER — SENNOSIDES-DOCUSATE SODIUM 8.6-50 MG PO TABS: 1.0000 | ORAL_TABLET | Freq: Every evening | ORAL | Status: DC | PRN

## 2023-10-07 MED ORDER — NALOXONE HCL 0.4 MG/ML IJ SOLN: 0.0800 mg | INTRAMUSCULAR | Status: DC | PRN

## 2023-10-07 MED ORDER — ONDANSETRON HCL 4 MG/2ML IJ SOLN
INTRAMUSCULAR | Status: DC | PRN
  Administered 2023-10-07: 4 mg via INTRAVENOUS

## 2023-10-07 MED ORDER — SODIUM CHLORIDE 0.9 % IV SOLN: INTRAVENOUS | Status: DC

## 2023-10-07 MED ORDER — 0.9 % SODIUM CHLORIDE (POUR BTL) OPTIME
TOPICAL | Status: DC | PRN
  Administered 2023-10-07 (×3): 1000 mL

## 2023-10-07 MED ORDER — CEFAZOLIN SODIUM-DEXTROSE 2-4 GM/100ML-% IV SOLN
2.0000 g | INTRAVENOUS | Status: AC
  Administered 2023-10-07: 2 g via INTRAVENOUS

## 2023-10-07 MED ORDER — ISOPROPYL ALCOHOL 70 % SOLN
Status: DC | PRN
  Administered 2023-10-07: 1 via TOPICAL

## 2023-10-07 MED ORDER — BISACODYL 5 MG PO TBEC: 5.0000 mg | DELAYED_RELEASE_TABLET | Freq: Every day | ORAL | Status: DC | PRN

## 2023-10-07 MED ORDER — FENTANYL CITRATE (PF) 100 MCG/2ML IJ SOLN
INTRAMUSCULAR | Status: AC
  Filled 2023-10-07: qty 2

## 2023-10-07 MED ORDER — CHLORHEXIDINE GLUCONATE CLOTH 2 % EX PADS: 6.0000 | MEDICATED_PAD | Freq: Once | CUTANEOUS | Status: DC

## 2023-10-07 MED ORDER — ROCURONIUM BROMIDE 10 MG/ML (PF) SYRINGE
PREFILLED_SYRINGE | INTRAVENOUS | Status: AC
  Filled 2023-10-07: qty 10

## 2023-10-07 MED ORDER — MIDAZOLAM HCL 2 MG/2ML IJ SOLN: 0.5000 mg | Freq: Once | INTRAMUSCULAR | Status: DC | PRN

## 2023-10-07 MED ORDER — MEPERIDINE HCL 25 MG/ML IJ SOLN: 6.2500 mg | INTRAMUSCULAR | Status: DC | PRN

## 2023-10-07 MED ORDER — SODIUM CHLORIDE 0.9 % IV SOLN
INTRAVENOUS | Status: DC | PRN
  Administered 2023-10-07: 500 mg via INTRAVENOUS

## 2023-10-07 MED ORDER — LIDOCAINE 2% (20 MG/ML) 5 ML SYRINGE
INTRAMUSCULAR | Status: DC | PRN
  Administered 2023-10-07: 20 mg via INTRAVENOUS

## 2023-10-07 MED ORDER — PHENYLEPHRINE HCL-NACL 20-0.9 MG/250ML-% IV SOLN
INTRAVENOUS | Status: DC | PRN
  Administered 2023-10-07: 30 ug/min via INTRAVENOUS

## 2023-10-07 MED ORDER — EPHEDRINE 5 MG/ML INJ
INTRAVENOUS | Status: AC
  Filled 2023-10-07: qty 5

## 2023-10-07 MED ORDER — CHLORHEXIDINE GLUCONATE CLOTH 2 % EX PADS
6.0000 | MEDICATED_PAD | Freq: Every day | CUTANEOUS | Status: DC
  Administered 2023-10-07 – 2023-10-09 (×3): 6 via TOPICAL

## 2023-10-07 MED ORDER — DEXAMETHASONE SODIUM PHOSPHATE 4 MG/ML IJ SOLN
4.0000 mg | Freq: Four times a day (QID) | INTRAMUSCULAR | Status: DC
Start: 2023-10-07 — End: 2023-10-08
  Administered 2023-10-07 – 2023-10-08 (×3): 4 mg via INTRAVENOUS
  Filled 2023-10-07 (×3): qty 1

## 2023-10-07 MED ORDER — DEXAMETHASONE SODIUM PHOSPHATE 10 MG/ML IJ SOLN
INTRAMUSCULAR | Status: AC
  Filled 2023-10-07: qty 1

## 2023-10-07 MED ORDER — CEFAZOLIN SODIUM-DEXTROSE 2-4 GM/100ML-% IV SOLN
INTRAVENOUS | Status: AC
  Filled 2023-10-07: qty 100

## 2023-10-07 MED ORDER — POTASSIUM CHLORIDE IN NACL 20-0.9 MEQ/L-% IV SOLN
INTRAVENOUS | Status: AC
  Filled 2023-10-07 (×2): qty 1000

## 2023-10-07 MED ORDER — MORPHINE SULFATE (PF) 2 MG/ML IV SOLN
1.0000 mg | INTRAVENOUS | Status: DC | PRN
Start: 2023-10-07 — End: 2023-10-14
  Administered 2023-10-07 – 2023-10-12 (×6): 2 mg via INTRAVENOUS
  Filled 2023-10-07 (×6): qty 1

## 2023-10-07 MED ORDER — CHLORHEXIDINE GLUCONATE 0.12 % MT SOLN: 15.0000 mL | Freq: Once | OROMUCOSAL | Status: AC

## 2023-10-07 MED ORDER — ORAL CARE MOUTH RINSE: 15.0000 mL | Freq: Once | OROMUCOSAL | Status: AC

## 2023-10-07 MED ORDER — BACITRACIN ZINC 500 UNIT/GM EX OINT
TOPICAL_OINTMENT | CUTANEOUS | Status: DC | PRN
  Administered 2023-10-07: 1 via TOPICAL

## 2023-10-07 MED ORDER — CHLORHEXIDINE GLUCONATE CLOTH 2 % EX PADS
6.0000 | MEDICATED_PAD | Freq: Once | CUTANEOUS | Status: DC
  Administered 2023-10-07: 6 via TOPICAL

## 2023-10-07 MED ORDER — SODIUM CHLORIDE 0.9 % IV SOLN: INTRAVENOUS | Status: DC | PRN

## 2023-10-07 MED ORDER — CHLORHEXIDINE GLUCONATE 0.12 % MT SOLN
OROMUCOSAL | Status: AC
  Administered 2023-10-07: 15 mL via OROMUCOSAL
  Filled 2023-10-07: qty 15

## 2023-10-07 MED ORDER — LABETALOL HCL 5 MG/ML IV SOLN: 10.0000 mg | INTRAVENOUS | Status: DC | PRN

## 2023-10-07 MED ORDER — MUPIROCIN 2 % EX OINT
1.0000 | TOPICAL_OINTMENT | Freq: Two times a day (BID) | CUTANEOUS | Status: AC
  Administered 2023-10-07 – 2023-10-11 (×9): 1 via NASAL
  Filled 2023-10-07 (×2): qty 22

## 2023-10-07 MED ORDER — SENNA 8.6 MG PO TABS
1.0000 | ORAL_TABLET | Freq: Two times a day (BID) | ORAL | Status: DC
  Administered 2023-10-08 – 2023-10-13 (×9): 8.6 mg via ORAL
  Filled 2023-10-07 (×10): qty 1

## 2023-10-07 MED ORDER — THROMBIN 20000 UNITS EX SOLR
CUTANEOUS | Status: AC
  Filled 2023-10-07: qty 20000

## 2023-10-07 MED ORDER — PANTOPRAZOLE SODIUM 40 MG IV SOLR
40.0000 mg | Freq: Every day | INTRAVENOUS | Status: DC
  Administered 2023-10-07 – 2023-10-08 (×2): 40 mg via INTRAVENOUS
  Filled 2023-10-07 (×2): qty 10

## 2023-10-07 MED ORDER — PHENYLEPHRINE 80 MCG/ML (10ML) SYRINGE FOR IV PUSH (FOR BLOOD PRESSURE SUPPORT)
PREFILLED_SYRINGE | INTRAVENOUS | Status: AC
  Filled 2023-10-07: qty 10

## 2023-10-07 MED ORDER — DEXAMETHASONE SODIUM PHOSPHATE 10 MG/ML IJ SOLN
INTRAMUSCULAR | Status: DC | PRN
  Administered 2023-10-07: 10 mg via INTRAVENOUS

## 2023-10-07 MED ORDER — MAGNESIUM CITRATE PO SOLN: 1.0000 | Freq: Once | ORAL | Status: DC | PRN

## 2023-10-07 MED ORDER — PROPOFOL 10 MG/ML IV BOLUS
INTRAVENOUS | Status: AC
  Filled 2023-10-07: qty 20

## 2023-10-07 MED ORDER — FENTANYL CITRATE (PF) 100 MCG/2ML IJ SOLN
25.0000 ug | INTRAMUSCULAR | Status: DC | PRN
  Administered 2023-10-07 (×2): 25 ug via INTRAVENOUS

## 2023-10-07 MED ORDER — ONDANSETRON HCL 4 MG/2ML IJ SOLN
INTRAMUSCULAR | Status: AC
  Filled 2023-10-07: qty 2

## 2023-10-07 MED ORDER — LIDOCAINE 2% (20 MG/ML) 5 ML SYRINGE
INTRAMUSCULAR | Status: AC
  Filled 2023-10-07: qty 5

## 2023-10-07 MED ORDER — FENTANYL CITRATE (PF) 250 MCG/5ML IJ SOLN
INTRAMUSCULAR | Status: DC | PRN
  Administered 2023-10-07: 50 ug via INTRAVENOUS
  Administered 2023-10-07: 200 ug via INTRAVENOUS

## 2023-10-07 MED ORDER — PROMETHAZINE HCL 12.5 MG PO TABS: 12.5000 mg | ORAL_TABLET | ORAL | Status: DC | PRN

## 2023-10-07 MED ORDER — LIDOCAINE-EPINEPHRINE 0.5 %-1:200000 IJ SOLN
INTRAMUSCULAR | Status: DC | PRN
  Administered 2023-10-07: 10 mL

## 2023-10-07 MED ORDER — BACITRACIN ZINC 500 UNIT/GM EX OINT
TOPICAL_OINTMENT | CUTANEOUS | Status: AC
  Filled 2023-10-07: qty 28.35

## 2023-10-07 MED ORDER — THROMBIN 20000 UNITS EX SOLR
CUTANEOUS | Status: DC | PRN
  Administered 2023-10-07: 20 mL via TOPICAL

## 2023-10-07 SURGICAL SUPPLY — 83 items
BAG COUNTER SPONGE SURGICOUNT (BAG) ×1 IMPLANT
BAND RUBBER #18 3X1/16 STRL (MISCELLANEOUS) ×2 IMPLANT
BENZOIN TINCTURE PRP APPL 2/3 (GAUZE/BANDAGES/DRESSINGS) IMPLANT
BLADE CLIPPER SURG (BLADE) ×1 IMPLANT
BLADE SAW GIGLI 16 STRL (MISCELLANEOUS) IMPLANT
BLADE SURG 15 STRL LF DISP TIS (BLADE) IMPLANT
BLADE ULTRA TIP 2M (BLADE) IMPLANT
BNDG GAUZE DERMACEA FLUFF 4 (GAUZE/BANDAGES/DRESSINGS) IMPLANT
BUR ACORN 6.0 PRECISION (BURR) ×1 IMPLANT
BUR MATCHSTICK NEURO 3.0 LAGG (BURR) IMPLANT
BUR SPIRAL ROUTER 2.3 (BUR) IMPLANT
CANISTER SUCT 3000ML PPV (MISCELLANEOUS) ×1 IMPLANT
CATH VENTRIC 35X38 W/TROCAR LG (CATHETERS) IMPLANT
CLIP TI MEDIUM 6 (CLIP) IMPLANT
CNTNR URN SCR LID CUP LEK RST (MISCELLANEOUS) ×1 IMPLANT
COVER BURR HOLE UNIV 10 (Orthopedic Implant) IMPLANT
COVERAGE SUPPORT O-ARM STEALTH (MISCELLANEOUS) ×1 IMPLANT
DERMABOND ADVANCED .7 DNX12 (GAUZE/BANDAGES/DRESSINGS) IMPLANT
DRAIN SUBARACHNOID (WOUND CARE) IMPLANT
DRAPE CAMERA VIDEO/LASER (DRAPES) IMPLANT
DRAPE MICROSCOPE SLANT 54X150 (MISCELLANEOUS) IMPLANT
DRAPE NEUROLOGICAL W/INCISE (DRAPES) ×1 IMPLANT
DRAPE SURG 17X23 STRL (DRAPES) IMPLANT
DRAPE SURG ORHT 6 SPLT 77X108 (DRAPES) IMPLANT
DRAPE WARM FLUID 44X44 (DRAPES) ×1 IMPLANT
DRSG TELFA 3X8 NADH STRL (GAUZE/BANDAGES/DRESSINGS) IMPLANT
DURAPREP 6ML APPLICATOR 50/CS (WOUND CARE) ×1 IMPLANT
ELECT REM PT RETURN 9FT ADLT (ELECTROSURGICAL) ×1 IMPLANT
ELECTRODE REM PT RTRN 9FT ADLT (ELECTROSURGICAL) ×1 IMPLANT
EVACUATOR 1/8 PVC DRAIN (DRAIN) IMPLANT
EVACUATOR SILICONE 100CC (DRAIN) IMPLANT
FEE COVERAGE SUPPORT O-ARM (MISCELLANEOUS) ×1 IMPLANT
FORCEPS BIPOLAR SPETZLER 8 1.0 (NEUROSURGERY SUPPLIES) IMPLANT
GAUZE 4X4 16PLY ~~LOC~~+RFID DBL (SPONGE) IMPLANT
GAUZE SPONGE 4X4 12PLY STRL (GAUZE/BANDAGES/DRESSINGS) ×1 IMPLANT
GLOVE ECLIPSE 6.5 STRL STRAW (GLOVE) ×2 IMPLANT
GLOVE EXAM NITRILE XL STR (GLOVE) IMPLANT
GOWN STRL REUS W/ TWL LRG LVL3 (GOWN DISPOSABLE) ×2 IMPLANT
GOWN STRL REUS W/ TWL XL LVL3 (GOWN DISPOSABLE) IMPLANT
GOWN STRL REUS W/TWL 2XL LVL3 (GOWN DISPOSABLE) IMPLANT
HEMOSTAT SURGICEL 2X14 (HEMOSTASIS) IMPLANT
HOOK DURA 1/2IN (MISCELLANEOUS) ×1 IMPLANT
KIT BASIN OR (CUSTOM PROCEDURE TRAY) ×1 IMPLANT
KIT DRAIN CSF ACCUDRAIN (MISCELLANEOUS) IMPLANT
KIT NDL BIOPSY PASSIVE (NEEDLE) IMPLANT
KIT NEEDLE BIOPSY PASSIVE (NEEDLE) ×1 IMPLANT
KIT TURNOVER KIT B (KITS) ×1 IMPLANT
MARKER SPHERE PSV REFLC 13MM (MARKER) ×3 IMPLANT
NDL HYPO 25X1 1.5 SAFETY (NEEDLE) ×1 IMPLANT
NDL SPNL 18GX3.5 QUINCKE PK (NEEDLE) IMPLANT
NEEDLE HYPO 25X1 1.5 SAFETY (NEEDLE) ×1 IMPLANT
NEEDLE SPNL 18GX3.5 QUINCKE PK (NEEDLE) IMPLANT
NS IRRIG 1000ML POUR BTL (IV SOLUTION) ×3 IMPLANT
PACK CRANIOTOMY CUSTOM (CUSTOM PROCEDURE TRAY) ×1 IMPLANT
PATTIES SURGICAL .25X.25 (GAUZE/BANDAGES/DRESSINGS) IMPLANT
PATTIES SURGICAL .5 X.5 (GAUZE/BANDAGES/DRESSINGS) IMPLANT
PATTIES SURGICAL .5 X3 (DISPOSABLE) IMPLANT
PATTIES SURGICAL 1/4 X 3 (GAUZE/BANDAGES/DRESSINGS) IMPLANT
PATTIES SURGICAL 1X1 (DISPOSABLE) IMPLANT
PLATE CRANIAL 12 2H RIGID UNI (Plate) IMPLANT
SCREW UNIII AXS SD 1.5X4 (Screw) IMPLANT
SPECIMEN JAR SMALL (MISCELLANEOUS) IMPLANT
SPIKE FLUID TRANSFER (MISCELLANEOUS) ×1 IMPLANT
SPONGE NEURO XRAY DETECT 1X3 (DISPOSABLE) IMPLANT
SPONGE SURGIFOAM ABS GEL 100 (HEMOSTASIS) ×1 IMPLANT
STAPLER VISISTAT (STAPLE) ×1 IMPLANT
SUT ETHILON 3 0 FSL (SUTURE) IMPLANT
SUT ETHILON 3 0 PS 1 (SUTURE) IMPLANT
SUT NURALON 4 0 TR CR/8 (SUTURE) ×3 IMPLANT
SUT SILK 0 TIES 10X30 (SUTURE) IMPLANT
SUT STEEL 0 18XMFL TIE 17 (SUTURE) IMPLANT
SUT VIC AB 2-0 CT2 18 VCP726D (SUTURE) ×2 IMPLANT
SWAB COLLECTION DEVICE MRSA (MISCELLANEOUS) IMPLANT
SWAB CULTURE ESWAB REG 1ML (MISCELLANEOUS) IMPLANT
SYR CONTROL 10ML LL (SYRINGE) IMPLANT
SYS ACCESS VIEWSITE 28X20X5 (NEUROSURGERY SUPPLIES) IMPLANT
TOWEL GREEN STERILE (TOWEL DISPOSABLE) ×1 IMPLANT
TOWEL GREEN STERILE FF (TOWEL DISPOSABLE) ×1 IMPLANT
TRAY FOLEY MTR SLVR 16FR STAT (SET/KITS/TRAYS/PACK) ×1 IMPLANT
TUBE CONNECTING 12X1/4 (SUCTIONS) ×1 IMPLANT
TUBING FEATHERFLOW (TUBING) IMPLANT
UNDERPAD 30X36 HEAVY ABSORB (UNDERPADS AND DIAPERS) ×1 IMPLANT
WATER STERILE IRR 1000ML POUR (IV SOLUTION) ×1 IMPLANT

## 2023-10-07 NOTE — Consult Note (Signed)
NAME:  Regina [name], MRN:  [id number], DOB:  18-Nov-1965, LOS: 6 ADMISSION DATE:  10/01/2023, CONSULTATION DATE:  1/31 REFERRING MD: MD Franky Macho CHIEF COMPLAINT:  AMS, Craniotomy   History of Present Illness:  Pt is a 58 yr female with significant pmhx of COPD, HTN, smoking hx, anxiety, and recent diagnosis of non small cell lung cancer on last hospital admission from 1/11 to 1/14, which has metastasized to bone, and brain. Plan was for patient to follow up with oncology outpatient and obtain a PET scan but patient missed her appointments due to increase in AMS. Patient presented to [facility] ED with complaints of worsening AMS and aphasia. Patient received SRS by Radiation Oncology team at [facility] on 1/31 and transferred to [facility] OR for craniotomy and tumor resection by MD Cabbell. PCCM consulted for continued medical management after procedure.   Pertinent  Medical History   Past Medical History:  Diagnosis Date   Allergy    Anxiety    Brain mass    COPD (chronic obstructive pulmonary disease) (HCC)    Emphysema of lung (HCC)    Hypertension      Significant Hospital Events: Including procedures, antibiotic start and stop dates in addition to other pertinent events   1/31 Transfer from [facility] to [facility] for craniotomy and tumor resection, received radiation therapy at [facility] prior to   Interim History / Subjective:  Patient able to follow commands but has expressive aphasia  Alert, in no distress   Objective   Blood pressure (!) 140/79, pulse 74, temperature 98.3 F (36.8 C), temperature source Oral, resp. rate 16, height 5\' 6"  (1.676 m), weight 48 kg, last menstrual period 06/06/2010, SpO2 97%.        Intake/Output Summary (Last 24 hours) at 10/07/2023 1357 Last data filed at 10/07/2023 1212 Gross per 24 hour  Intake 1680 ml  Output 875 ml  Net 805 ml   Filed Weights   10/01/23 1351  Weight: 48 kg    Examination: General: chronically ill older adult female, sitting up in ICU bed, no distress   HENT: Normocephalic, left frontal surgical incision s/p craniotomy, missing teeth, poor dentition, PERRLA intact  Lungs: clear, diminished throughout lung fields, no distress  Cardiovascular: s1,s2, RRR, no JVD, no MRG  Abdomen: BS active, soft  Extremities: fractured right arm-weak and painful to move, moves allother extremities with ease  Neuro: alert, expressive aphasia GU: foley, intact, yellow urine  Resolved Hospital Problem list   N/a   Assessment & Plan:  S/p craniotomy with tumor resection on 1/31 Adenocarcinoma Right Lung upper lobe  -NSCLC metastasis to brain with cerebral edema  MRI 1/25 increase in mass size from previous imaging 14 days ago -with mass effect and midline shift of 8mm Patient has expressive aphasia which has been ongoing and able to follow commands P: Neurosurgy managing, appreciate recs/assistance  Continue q 4 neurochecks Continue seizure precautions, continue keppra  Continue dexamethasone  Will place on delirium and fall precautions Monitor for signs of bleeding at surgical site  Continue neuroprotective measures: normothermia, normotension, normoxia, euglycemia, optimization of electrolytes   Pathological Right Humerus Fracture  -Bone metastasis  P: Continue NWB with RUE, with sling Will need to reach out to orthopedics once cleared from Neurosurg standpoint  Order PT/OT consult   Hyponatremia Na 133 on 1/29  P: Repeat BMET now, follow up with   Leukocytosis  Secondary to dexamethasone use  No fever P: Continue to trend WBCs Continue to follow fever curve as  well   Severe Protein Malnutrition  P: Reconsult RD Continue supplements   HTN P: Continue amlodipine   COPD hx Not on oxygen at home, not taking any medications P: O2 sat goal >88% Will order IS Continue aggressive pulm hygiene    Best Practice -Per primary     Labs   CBC: Recent Labs  Lab 10/01/23 1419 10/02/23 0542 10/03/23 0344 10/05/23 0422  WBC 9.5  9.8 18.3* 17.2*  NEUTROABS 6.0 8.7*  --   --   HGB 14.7 14.8 14.5 15.0  HCT 43.5 43.8 42.9 44.4  MCV 87.9 88.3 88.1 87.9  PLT 458* 339 428* 435*    Basic Metabolic Panel: Recent Labs  Lab 10/01/23 1419 10/02/23 0542 10/03/23 0344 10/05/23 0422  NA 137 134* 135 133*  K 3.5 4.5 4.2 4.0  CL 100 101 101 99  CO2 25 25 22 25   GLUCOSE 131* 139* 124* 144*  BUN 12 8 21* 24*  CREATININE 0.87 0.85 0.45 0.58  CALCIUM 9.5 9.4 10.1 9.7  MG  --  2.0 2.1  --    GFR: Estimated Creatinine Clearance: 57.4 mL/min (by C-G formula based on SCr of 0.58 mg/dL). Recent Labs  Lab 10/01/23 1419 10/02/23 0542 10/03/23 0344 10/05/23 0422  WBC 9.5 9.8 18.3* 17.2*    Liver Function Tests: Recent Labs  Lab 10/01/23 1419 10/02/23 0542  AST 20 22  ALT 14 14  ALKPHOS 84 91  BILITOT 0.3 0.7  PROT 7.1 6.7  ALBUMIN 3.6 3.2*   No results for input(s): "LIPASE", "AMYLASE" in the last 168 hours. No results for input(s): "AMMONIA" in the last 168 hours.  ABG    Component Value Date/Time   TCO2 24 09/17/2023 1810     Coagulation Profile: No results for input(s): "INR", "PROTIME" in the last 168 hours.  Cardiac Enzymes: No results for input(s): "CKTOTAL", "CKMB", "CKMBINDEX", "TROPONINI" in the last 168 hours.  HbA1C: No results found for: "HGBA1C"  CBG: Recent Labs  Lab 10/01/23 1433  GLUCAP 133*    Review of Systems:   See HPI   Past Medical History:  She,  has a past medical history of Allergy, Anxiety, Brain mass, COPD (chronic obstructive pulmonary disease) (HCC), Emphysema of lung (HCC), and Hypertension.   Surgical History:   Past Surgical History:  Procedure Laterality Date   dental procedure       Social History:   reports that she quit smoking about 3 years ago. Her smoking use included cigarettes. She has never used smokeless tobacco. She reports current alcohol use of about 6.0 standard drinks of alcohol per week. She reports current drug use. Frequency: 4.00  times per week. Drug: Marijuana.   Family History:  Her family history includes Hypertension in her father and mother; Leukemia in her mother. There is no history of Cancer, Diabetes, Heart disease, Colon cancer, Esophageal cancer, Rectal cancer, or Stomach cancer.   Allergies Allergies  Allergen Reactions   Pork Allergy Nausea And Vomiting   Bee Venom Hives     Home Medications  Prior to Admission medications   Medication Sig Start Date End Date Taking? Authorizing Provider  acetaminophen (TYLENOL) 325 MG tablet Take 2 tablets (650 mg total) by mouth every 6 (six) hours as needed for mild pain (pain score 1-3) (or Fever >/= 101). 09/20/23  Yes Azucena Fallen, MD  levETIRAcetam (KEPPRA) 500 MG tablet Take 1 tablet (500 mg total) by mouth 2 (two) times daily. 09/20/23  Yes Carma Leaven  C, MD  oxyCODONE (ROXICODONE) 5 MG immediate release tablet Take 1 tablet (5 mg total) by mouth every 4 (four) hours as needed for severe pain (pain score 7-10). 09/20/23  Yes Azucena Fallen, MD     Critical care time: 40 mins   Christian Katrinka Blazing AGACNP-BC   [facility] 10/07/2023, 2:49 PM  Please see [email or web address] for pager details.  From 7A-7P if no response, please call [phone]. After hours, please call ELink [phone].

## 2023-10-07 NOTE — Radiation Completion Notes (Signed)
Patient Name: Regina [name], Regina [name] MRN: [id number] Date of Birth: 1966-08-22 Referring Physician: Si Gaul, M.D. Date of Service: 2023-10-07 Radiation Oncologist: Margaretmary Bayley, M.D. [facility]                             RADIATION ONCOLOGY END OF TREATMENT NOTE     Diagnosis: C79.31 Secondary malignant neoplasm of brain Staging on 2023-09-22: Primary adenocarcinoma of upper lobe of right lung (HCC) T=cT1c, N=cN3, M=cM1c2 Intent: Curative     ==========DELIVERED PLANS==========  First Treatment Date: 2023-10-04 Last Treatment Date: 2023-10-06   Plan Name: Brain_SRS Site: Brain Technique: SBRT/SRT-IMRT Mode: Photon Dose Per Fraction: 8 Gy Prescribed Dose (Delivered / Prescribed): 24 Gy / 24 Gy Prescribed Fxs (Delivered / Prescribed): 3 / 3     ==========ON TREATMENT VISIT DATES========== 2023-10-04, 2023-10-05, 2023-10-06     ==========UPCOMING VISITS==========       ==========APPENDIX - ON TREATMENT VISIT NOTES==========   See weekly On Treatment Notes in Epic for details in the Media tab (listed as Progress notes on the On Treatment Visit Dates listed above).

## 2023-10-07 NOTE — Anesthesia Procedure Notes (Signed)
Procedure Name: Intubation Date/Time: 10/07/2023 9:30 AM  Performed by: Venia Carbon, CRNAPre-anesthesia Checklist: Patient identified, Emergency Drugs available, Suction available and Patient being monitored Patient Re-evaluated:Patient Re-evaluated prior to induction Oxygen Delivery Method: Circle System Utilized Preoxygenation: Pre-oxygenation with 100% oxygen Induction Type: IV induction and Cricoid Pressure applied Ventilation: Mask ventilation without difficulty Laryngoscope Size: Mac and 3 Grade View: Grade I Tube type: Oral Tube size: 7.0 mm Number of attempts: 1 Airway Equipment and Method: Stylet Placement Confirmation: ETT inserted through vocal cords under direct vision, positive ETCO2 and breath sounds checked- equal and bilateral Secured at: 21 cm Tube secured with: Tape Dental Injury: Teeth and Oropharynx as per pre-operative assessment  Comments: Intubated by Kenney Houseman, SRNA

## 2023-10-07 NOTE — Transfer of Care (Signed)
Immediate Anesthesia Transfer of Care Note  Patient: Regina [name]  Procedure(s) Performed: FRONTAL CRANIOTOMY FOR ABSCESS (Left) APPLICATION OF CRANIAL NAVIGATION (Left)  Patient Location: PACU  Anesthesia Type:General  Level of Consciousness: awake and alert   Airway & Oxygen Therapy: Patient Spontanous Breathing and Patient connected to face mask oxygen  Post-op Assessment: Report given to RN and Post -op Vital signs reviewed and stable  Post vital signs: Reviewed and stable  Last Vitals:  Vitals Value Taken Time  BP 139/89 10/07/23 1208  Temp 36.6 C 10/07/23 1208  Pulse 81 10/07/23 1213  Resp 20 10/07/23 1213  SpO2 100 % 10/07/23 1213  Vitals shown include unfiled device data.  Last Pain:  Vitals:   10/07/23 0833  TempSrc:   PainSc: 9       Patients Stated Pain Goal: 3 (10/05/23 1017)  Complications: There were no known notable events for this encounter.

## 2023-10-07 NOTE — Anesthesia Postprocedure Evaluation (Signed)
Anesthesia Post Note  Patient: Regina [name]  Procedure(s) Performed: FRONTAL CRANIOTOMY FOR ABSCESS (Left) APPLICATION OF CRANIAL NAVIGATION (Left)     Patient location during evaluation: PACU Anesthesia Type: General Level of consciousness: sedated and patient cooperative Pain management: pain level controlled Vital Signs Assessment: post-procedure vital signs reviewed and stable Respiratory status: spontaneous breathing, nonlabored ventilation, respiratory function stable and patient connected to nasal cannula oxygen Cardiovascular status: blood pressure returned to baseline and stable Postop Assessment: no apparent nausea or vomiting Anesthetic complications: no   There were no known notable events for this encounter.  Last Vitals:  Vitals:   10/07/23 1700 10/07/23 1800  BP: (!) 140/79 (!) 140/83  Pulse: 72 73  Resp: 18 15  Temp:    SpO2: 97% 98%                    Jasyn Mey,E. Moselle Rister

## 2023-10-07 NOTE — Progress Notes (Signed)
Nutrition Brief Note  Pt discussed during ICU rounds and with RN, MD, NP.   Pt meets criteria for severe malnutrition  Newly dx with stage IV lung cancer metastatic to brain and bone.   Interventions:  Encourage PO intake of Regular diet Ensure Enlive po BID, each supplement provides 350 kcal and 20 grams of protein.   1/25 - admitted to [facility] for aphasia due to vasogenic edema due to neoplasm 1/31 - tx to [facility] for crani  Continue  Maayan Jenning P., RD, LDN, CNSC See AMiON for contact information

## 2023-10-07 NOTE — Op Note (Signed)
10/07/2023  3:29 PM  PATIENT:  Regina [name]  58 y.o. female With an enhancing mass felt to be a metastatic tumor from primary lung ca in the left frontal lobe. PRE-OPERATIVE DIAGNOSIS:  BRAIN TUMOR  POST-OPERATIVE DIAGNOSIS:  BRAIN ABSCESS  PROCEDURE:  Procedure(s): FRONTAL CRANIOTOMY FOR ABSCESS APPLICATION OF CRANIAL NAVIGATION  SURGEON: Surgeon(s): Coletta Memos, MD Dawley, Alan Mulder, DO  ASSISTANTS:Dawley, Kendell Bane  ANESTHESIA:   general  EBL:  Total I/O In: 1262.9 [I.V.:1162.9; IV Piggyback:100] Out: 875 [Urine:775; Blood:100]  BLOOD ADMINISTERED:none  CELL SAVER GIVEN:not used  COUNT:per nursing  DRAINS: Urinary Catheter (Foley)   SPECIMEN:  Source of Specimen:  left frontal mass  DICTATION: Regina [name] was taken to the operating room, intubated, and placed under a general anesthetic without difficulty. She was positioned supine on the OR table. Once there was adequate anesthesia I placed her head in a three pin Mayfield head holder. I registered her head to the Stealth System achieving good accuracy.Her head was shaved, was prepped, and draped in a sterile manner.  Using my preoperative plan I used navigation to plan my incision and craniotomy flap. I infiltrated lidocaine into the planned coronal incision, then opened the with a 10 blade. I placed an Aphelbaum retractor to expose the skull. Checking with navigation I turned the craniotomy flap after creating one burr hole. The dura now exposed I verified again my operative plan. With Dr. Mattie Marlin assistance I opened the dura, and scored the brain surface with bipolar cautery. I used the 15 blade to make an incision in the brain. I placed the navigation wand into the brain until I was at the surface of the mass. Dr. Jake Samples with suction and cautery followed my probe to the mass. We encountered a capsule and irregular appearing tissue. When opened we had what appeared to be pus coming from the mass. I took cultures of the pus  and sent this to the lab. I also took a small portion of the wall and sent that to pathology.  We irrigated copiously until no more purulence was seen. We achieved hemostasis then closed.  I approximated the dura. I placed plates on the bone flap and approximated it to the skull with screws. I approximated the galea, then the scalp. I used staples on the scalp edges. I applied dermabond for a sterile dressing. I removed the head holder, she was moved to the bed and was extubated  PLAN OF CARE: Admit to inpatient   PATIENT DISPOSITION:  PACU - hemodynamically stable.   Delay start of Pharmacological VTE agent (>24hrs) due to surgical blood loss or risk of bleeding:  yes

## 2023-10-07 NOTE — Care Plan (Signed)
Patient remains under neurosurgical service.  We will sign off.

## 2023-10-07 NOTE — Progress Notes (Signed)
PROGRESS NOTE    Regina [name]  WJX:[id number] DOB: 1965-11-11 DOA: 10/01/2023 PCP: [name], NP   Brief Narrative:  This 58 yrs old female with hx. of smoking, COPD, HTN, and anxiety and recently diagnosed lung cancer few weeks ago metastatic to brain , LN and bone (c/b pathological right humerus fracture) who was admitted for mental status changes and aphasia. She was recently admitted from 1/11 to 1/14, found to have lung mass, brain mass and pathologic fracture due to right humerus metastasis --> biopsy showed NSCLC.  Previous plan was for outpatient follow-up and treatment with oncology.  Patient referred for PET and radiation oncology but missed appointments per family,  admitted for worsening mental status and aphasia.   Assessment & Plan:   Principal Problem:   Primary adenocarcinoma of upper lobe of right lung (HCC) Active Problems:   HTN (hypertension)   Neoplasm causing mass effect and brain compression on adjacent structures (HCC)   Cerebral edema (HCC)   Metastasis to brain [facility])   Metastasis to bone [facility])   Palliative care encounter   Pathological fracture of right humerus with delayed healing   Lung cancer metastatic to brain (HCC)   Vasogenic brain edema (HCC)   Counseling and coordination of care   Protein-calorie malnutrition, severe   Cerebral intracranial abscess  Primary adenocarcinoma Right Lung upper lobe. Brain metastasis causing cerebral edema with noted brain compression due to mass effect: Continue radiation therapy. Patient transferred from [facility] after radiation treatment for craniotomy/POD #1.  By [name] . Continue Keppra and Dexamethasone Follows with Dr. Gwenyth Bouillon in outpatient setting. Status post craniotomy /surgical resection, POD #1   Metastasis to bone: Pathologic humerus fracture: NWB RUE Sling in place, nonweightbearing as discussed Post-surgery, re-engage Orthopedics (Dr. Christell Constant) for IM nailing -timing likely 2 weeks  after radiation per previous discussion to ensure proper wound healing.   COPD (chronic obstructive pulmonary disease) (HCC) No active flare, not on medication at home.   HTN (hypertension) Continue amlodipine    Aphasia, resolved:   COPD (chronic obstructive pulmonary disease) (HCC) No active flare, not on medication at home   Severe protein calorie malnutrition - Consult dietitian - Start supplements   DVT prophylaxis: SCDs Code Status:Full code Family Communication: No family at bed side. Disposition Plan:   Status is: Inpatient Remains inpatient appropriate because: Underwent neurosurgery.   Consultants:  Neurosurgery  Procedures:  Antimicrobials: Anti-infectives (From admission, onward)    Start     Dose/Rate Route Frequency Ordered Stop   10/07/23 0853  ceFAZolin (ANCEF) 2-4 GM/100ML-% IVPB       Note to Pharmacy: [facility] [place]: cabinet override      10/07/23 0853 10/07/23 0956   10/07/23 0845  ceFAZolin (ANCEF) IVPB 2g/100 mL premix        2 g 200 mL/hr over 30 Minutes Intravenous On call to O.R. 10/07/23 0841 10/07/23 0935   10/06/23 1545  fluconazole (DIFLUCAN) tablet 150 mg        150 mg Oral  Once 10/06/23 1452 10/06/23 1607      Subjective: Patient was seen and examined at bedside.  Overnight events noted.   Patient is status post craniotomy and surgical resection of the tumor.  POD #1 , tolerated well. Patient was doing better.  She is alert and oriented , following commands  has expressive aphasia.  Objective: Vitals:   10/07/23 1312 10/07/23 1315 10/07/23 1330 10/07/23 1400  BP:  (!) 140/79  (!) 136/91  Pulse: 93  71 74 71  Resp: 14 14 16 20   Temp: 98.3 F (36.8 C)     TempSrc: Oral     SpO2: 98% 98% 97% 97%  Weight:      Height:        Intake/Output Summary (Last 24 hours) at 10/07/2023 1437 Last data filed at 10/07/2023 1212 Gross per 24 hour  Intake 1560 ml  Output 875 ml  Net 685 ml   Filed Weights   10/01/23 1351  Weight: 48  kg    Examination:  General exam: Appears calm and comfortable, staples noted on the left forehead. Respiratory system: Clear to auscultation. Respiratory effort normal.  RR 16 Cardiovascular system: S1 & S2 heard, RRR. No JVD, murmurs, rubs, gallops or clicks. No pedal edema. Gastrointestinal system: Abdomen is nondistended, soft and nontender.Normal bowel sounds heard. Central nervous system: Alert and oriented x 3.  Expressive aphasia Extremities: Restricted right upper extremity due to pathological fracture right humerus Skin: No rashes, lesions or ulcers Psychiatry: Judgement and insight appear normal. Mood & affect appropriate.     Data Reviewed: I have personally reviewed following labs and imaging studies  CBC: Recent Labs  Lab 10/01/23 1419 10/02/23 0542 10/03/23 0344 10/05/23 0422  WBC 9.5 9.8 18.3* 17.2*  NEUTROABS 6.0 8.7*  --   --   HGB 14.7 14.8 14.5 15.0  HCT 43.5 43.8 42.9 44.4  MCV 87.9 88.3 88.1 87.9  PLT 458* 339 428* 435*   Basic Metabolic Panel: Recent Labs  Lab 10/01/23 1419 10/02/23 0542 10/03/23 0344 10/05/23 0422  NA 137 134* 135 133*  K 3.5 4.5 4.2 4.0  CL 100 101 101 99  CO2 25 25 22 25   GLUCOSE 131* 139* 124* 144*  BUN 12 8 21* 24*  CREATININE 0.87 0.85 0.45 0.58  CALCIUM 9.5 9.4 10.1 9.7  MG  --  2.0 2.1  --    GFR: Estimated Creatinine Clearance: 57.4 mL/min (by C-G formula based on SCr of 0.58 mg/dL). Liver Function Tests: Recent Labs  Lab 10/01/23 1419 10/02/23 0542  AST 20 22  ALT 14 14  ALKPHOS 84 91  BILITOT 0.3 0.7  PROT 7.1 6.7  ALBUMIN 3.6 3.2*   No results for input(s): "LIPASE", "AMYLASE" in the last 168 hours. No results for input(s): "AMMONIA" in the last 168 hours. Coagulation Profile: No results for input(s): "INR", "PROTIME" in the last 168 hours. Cardiac Enzymes: No results for input(s): "CKTOTAL", "CKMB", "CKMBINDEX", "TROPONINI" in the last 168 hours. BNP (last 3 results) No results for input(s):  "PROBNP" in the last 8760 hours. HbA1C: No results for input(s): "HGBA1C" in the last 72 hours. CBG: Recent Labs  Lab 10/01/23 1433  GLUCAP 133*   Lipid Profile: No results for input(s): "CHOL", "HDL", "LDLCALC", "TRIG", "CHOLHDL", "LDLDIRECT" in the last 72 hours. Thyroid Function Tests: No results for input(s): "TSH", "T4TOTAL", "FREET4", "T3FREE", "THYROIDAB" in the last 72 hours. Anemia Panel: No results for input(s): "VITAMINB12", "FOLATE", "FERRITIN", "TIBC", "IRON", "RETICCTPCT" in the last 72 hours. Sepsis Labs: No results for input(s): "PROCALCITON", "LATICACIDVEN" in the last 168 hours.  Recent Results (from the past 240 hours)  Surgical pcr screen     Status: Abnormal   Collection Time: 10/07/23  8:26 AM   Specimen: Nasal Mucosa; Nasal Swab  Result Value Ref Range Status   MRSA, PCR NEGATIVE NEGATIVE Final   Staphylococcus aureus POSITIVE (A) NEGATIVE Final    Comment: (NOTE) The Xpert SA Assay (FDA approved for NASAL specimens in  patients 32 years of age and older), is one component of a comprehensive surveillance program. It is not intended to diagnose infection nor to guide or monitor treatment. Performed at [facility], [address], [place], [place] [address]     Radiology Studies: No results found.  Scheduled Meds:  amLODipine  5 mg Oral Daily   Chlorhexidine Gluconate Cloth  6 each Topical Daily   dexamethasone (DECADRON) injection  4 mg Intravenous Q6H   feeding supplement  237 mL Oral TID BM   levETIRAcetam  500 mg Oral BID   loratadine  10 mg Oral Daily   multivitamin with minerals  1 tablet Oral Daily   mupirocin ointment  1 Application Nasal BID   pantoprazole (PROTONIX) IV  40 mg Intravenous QHS   senna  1 tablet Oral BID   Continuous Infusions:  0.9 % NaCl with KCl 20 mEq / L 80 mL/hr at 10/07/23 1412     LOS: 6 days    Time spent: 50 mins    Willeen Niece, MD [facility]   If 7PM-7AM, please  contact night-coverage

## 2023-10-07 NOTE — Progress Notes (Signed)
BP (!) 166/88   Pulse 83   Temp 98.2 F (36.8 C)   Resp 18   Ht 5\' 6"  (1.676 m)   Wt 48 kg   LMP 06/06/2010   SpO2 99%   BMI 17.08 kg/m  Alert, oriented, following commands. Expressive aphasia. Restricted movement right upper extremity due to pathological fracture in right humerus. Perrl, full eom Tongue and uvula midline.  Some facial droop on left Moving lower extremities Agrees to craniotomy post radiosurgery to left frontal mass. Risks and benefits including but not limited to bleeding, infection, stroke coma and death, brain damage, weakness, change in personality, seizures, inability to resect tumor, and other risks. She understands and wishes to proceed.

## 2023-10-07 NOTE — Anesthesia Procedure Notes (Signed)
Arterial Line Insertion Start/End1/31/2025 8:40 AM, 10/07/2023 9:00 AM Performed by: Venia Carbon, CRNA, CRNA  Patient location: Pre-op. Preanesthetic checklist: patient identified, IV checked, site marked, risks and benefits discussed, surgical consent, monitors and equipment checked, pre-op evaluation, timeout performed and anesthesia consent Lidocaine 1% used for infiltration Left, radial was placed Catheter size: 20 G Hand hygiene performed  and maximum sterile barriers used   Attempts: 1 Procedure performed without using ultrasound guided technique. Following insertion, dressing applied and Biopatch. Post procedure assessment: normal and unchanged  Patient tolerated the procedure well with no immediate complications. Additional procedure comments: Inserted by Kenney Houseman, SRNA.

## 2023-10-08 DIAGNOSIS — C3411 Malignant neoplasm of upper lobe, right bronchus or lung: Secondary | ICD-10-CM | POA: Diagnosis not present

## 2023-10-08 DIAGNOSIS — C3490 Malignant neoplasm of unspecified part of unspecified bronchus or lung: Secondary | ICD-10-CM | POA: Diagnosis not present

## 2023-10-08 DIAGNOSIS — G060 Intracranial abscess and granuloma: Secondary | ICD-10-CM | POA: Diagnosis not present

## 2023-10-08 DIAGNOSIS — C7931 Secondary malignant neoplasm of brain: Secondary | ICD-10-CM | POA: Diagnosis not present

## 2023-10-08 LAB — BASIC METABOLIC PANEL
Anion gap: 10 (ref 5–15)
BUN: 16 mg/dL (ref 6–20)
CO2: 21 mmol/L — ABNORMAL LOW (ref 22–32)
Calcium: 8.9 mg/dL (ref 8.9–10.3)
Chloride: 98 mmol/L (ref 98–111)
Creatinine, Ser: 0.7 mg/dL (ref 0.44–1.00)
GFR, Estimated: >60 mL/min (ref >60)
Glucose, Bld: 149 mg/dL — ABNORMAL HIGH (ref 70–99)
Potassium: 5.1 mmol/L (ref 3.5–5.1)
Sodium: 129 mmol/L — ABNORMAL LOW (ref 135–145)

## 2023-10-08 LAB — CBC
HCT: 40.5 % (ref 36.0–46.0)
Hemoglobin: 14 g/dL (ref 12.0–15.0)
MCH: 29.6 pg (ref 26.0–34.0)
MCHC: 34.6 g/dL (ref 30.0–36.0)
MCV: 85.6 fL (ref 80.0–100.0)
Platelets: 392 10*3/uL (ref 150–400)
RBC: 4.73 MIL/uL (ref 3.87–5.11)
RDW: 14.1 % (ref 11.5–15.5)
WBC: 23.2 10*3/uL — ABNORMAL HIGH (ref 4.0–10.5)
nRBC: 0 % (ref 0.0–0.2)

## 2023-10-08 LAB — PHOSPHORUS: Phosphorus: 3.2 mg/dL (ref 2.5–4.6)

## 2023-10-08 LAB — MAGNESIUM: Magnesium: 2 mg/dL (ref 1.7–2.4)

## 2023-10-08 MED ORDER — DEXAMETHASONE SODIUM PHOSPHATE 4 MG/ML IJ SOLN
4.0000 mg | Freq: Three times a day (TID) | INTRAMUSCULAR | Status: DC
  Administered 2023-10-08 – 2023-10-13 (×16): 4 mg via INTRAVENOUS
  Filled 2023-10-08 (×16): qty 1

## 2023-10-08 MED ORDER — CLINDAMYCIN PHOSPHATE 600 MG/50ML IV SOLN
600.0000 mg | Freq: Three times a day (TID) | INTRAVENOUS | Status: DC
  Administered 2023-10-08 – 2023-10-13 (×16): 600 mg via INTRAVENOUS
  Filled 2023-10-08 (×19): qty 50

## 2023-10-08 NOTE — Progress Notes (Signed)
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 Plan was for patient to follow up with oncology outpatient and obtain a PET scan but patient missed her appointments due to increase in AMS. Patient presented to [facility] ED with complaints of worsening AMS and aphasia. Patient received SRS by Radiation Oncology team at [facility] on 1/31 and transferred to [facility] OR for craniotomy and tumor resection by MD Cabbell. PCCM consulted for continued medical management after procedure.   Pertinent  Medical History   Past Medical History:  Diagnosis Date   Allergy    Anxiety    Brain mass    COPD (chronic obstructive pulmonary disease) (HCC)    Emphysema of lung (HCC)    Hypertension      Significant Hospital Events: Including procedures, antibiotic start and stop dates in addition to other pertinent events   1/31 Transfer from [facility] to [facility] for craniotomy and tumor resection, received radiation therapy at [facility] prior to  1/31 brain lesion more consistent with abscess and tumor  Interim History / Subjective:   Brain lesion more consistent with abscess than tumor Room air No drips Na+ 136 > 129 WBC 23.2  Objective   Blood pressure (!) 154/82, pulse 94, temperature 97.6 F (36.4 C), temperature source Axillary, resp. rate (!) 27, height 5\' 6"  (1.676 m), weight 48 kg, last menstrual period 06/06/2010, SpO2 97%.        Intake/Output Summary (Last 24 hours) at 10/08/2023 1112 Last data filed at 10/08/2023 0800 Gross per 24 hour  Intake 1753.21 ml  Output 2450 ml  Net -696.79 ml   Filed Weights   10/01/23 1351  Weight: 48 kg     Examination: General: Thin ill-appearing woman sitting up in bed in no distress HENT: Left frontal incision, no secretions, no stridor, edentulous Lungs: Clear bilaterally Cardiovascular: Regular, no murmur Abdomen: Nondistended with positive bowel sounds Extremities: Right arm weak, but able to move in a sling, other extremities normal Neuro: Awake, expressive aphasia, moves extremities  Resolved Hospital Problem list   N/a   Assessment & Plan:  S/p craniotomy with mass lesion, resected 1/31, more consistent with abscess than tumor Adenocarcinoma Right Lung upper lobe  MRI 1/25 increase in mass size from previous imaging 14 days ago -with mass effect and midline shift of 8mm P: -Postoperative management as per neurosurgery plans -Follow Gram stain and culture from mass lesion -Plan to start clindamycin 2/1 pending culture data -Continue neurochecks every 4 hours -Seizure precautions on Keppra -Dexamethasone given cerebral edema -Follow for any evidence of bleeding -Neuroprotective measures: Normothermia, normotension, normoxia, euglycemia, optimization of electrolytes   Pathological Right Humerus Fracture  -Bone metastasis  P: -Nonweightbearing, sling in place right upper extremity -Will discuss with orthopedics once stable from acute events -PT/OT  Hyponatremia Na 133 on 1/29  P: -Follow sodium trend, IV fluids will end at 1300 today  Severe Protein Malnutrition  P: -RD consult -Continue supplements  HTN P: -Amlodipine  COPD hx Not on oxygen at home, not taking any medications P: -O2 sat goal >88% -Pulmonary hygiene, I-S      Labs  CBC: Recent Labs  Lab 10/01/23 1419 10/02/23 0542 10/03/23 0344 10/05/23 0422 10/07/23 1509 10/08/23 0856  WBC 9.5 9.8 18.3* 17.2* 22.6* 23.2*  NEUTROABS 6.0 8.7*  --   --   --   --   HGB 14.7 14.8 14.5 15.0 14.5 14.0  HCT 43.5 43.8 42.9 44.4 42.9 40.5  MCV 87.9 88.3 88.1 87.9 87.2 85.6  PLT 458* 339 428*  435* 394 392    Basic Metabolic Panel: Recent Labs  Lab 10/02/23 0542 10/03/23 0344 10/05/23 0422 10/07/23 1509 10/08/23 0856  NA 134* 135 133* 136 129*  K 4.5 4.2 4.0 4.8 5.1  CL 101 101 99 105 98  CO2 25 22 25  19* 21*  GLUCOSE 139* 124* 144* 145* 149*  BUN 8 21* 24* 18 16  CREATININE 0.85 0.45 0.58 0.68 0.70  CALCIUM 9.4 10.1 9.7 8.7* 8.9  MG 2.0 2.1  --   --  2.0  PHOS  --   --   --   --  3.2   GFR: Estimated Creatinine Clearance: 57.4 mL/min (by C-G formula based on SCr of 0.7 mg/dL). Recent Labs  Lab 10/03/23 0344 10/05/23 0422 10/07/23 1509 10/08/23 0856  WBC 18.3* 17.2* 22.6* 23.2*    Liver Function Tests: Recent Labs  Lab 10/01/23 1419 10/02/23 0542  AST 20 22  ALT 14 14  ALKPHOS 84 91  BILITOT 0.3 0.7  PROT 7.1 6.7  ALBUMIN 3.6 3.2*   No results for input(s): "LIPASE", "AMYLASE" in the last 168 hours. No results for input(s): "AMMONIA" in the last 168 hours.  ABG    Component Value Date/Time   TCO2 24 09/17/2023 1810     Coagulation Profile: No results for input(s): "INR", "PROTIME" in the last 168 hours.  Cardiac Enzymes: No results for input(s): "CKTOTAL", "CKMB", "CKMBINDEX", "TROPONINI" in the last 168 hours.  HbA1C: No results found for: "HGBA1C"  CBG: Recent Labs  Lab 10/01/23 1433  GLUCAP 133*     Critical care time: 31 mins     Levy Pupa, MD, PhD 10/08/2023, 11:16 AM [facility] [phone] or if no answer before 7:00PM call [phone] For any issues after 7:00PM please call eLink [phone]

## 2023-10-08 NOTE — Plan of Care (Signed)
  Problem: Education: Goal: Knowledge of General Education information will improve Description: Including pain rating scale, medication(s)/side effects and non-pharmacologic comfort measures Outcome: Progressing   Problem: Health Behavior/Discharge Planning: Goal: Ability to manage health-related needs will improve Outcome: Progressing   Problem: Clinical Measurements: Goal: Ability to maintain clinical measurements within normal limits will improve Outcome: Progressing Goal: Diagnostic test results will improve Outcome: Progressing Goal: Respiratory complications will improve Outcome: Progressing Goal: Cardiovascular complication will be avoided Outcome: Progressing   Problem: Activity: Goal: Risk for activity intolerance will decrease Outcome: Progressing   Problem: Nutrition: Goal: Adequate nutrition will be maintained Outcome: Progressing   Problem: Coping: Goal: Level of anxiety will decrease Outcome: Progressing   Problem: Elimination: Goal: Will not experience complications related to urinary retention Outcome: Progressing   Problem: Pain Managment: Goal: General experience of comfort will improve and/or be controlled Outcome: Progressing   Problem: Safety: Goal: Ability to remain free from injury will improve Outcome: Progressing   Problem: Skin Integrity: Goal: Risk for impaired skin integrity will decrease Outcome: Progressing   Problem: Education: Goal: Knowledge of the prescribed therapeutic regimen will improve Outcome: Progressing   Problem: Clinical Measurements: Goal: Usual level of consciousness will be regained or maintained. Outcome: Progressing Goal: Neurologic status will improve Outcome: Progressing Goal: Ability to maintain intracranial pressure will improve Outcome: Progressing   Problem: Skin Integrity: Goal: Demonstration of wound healing without infection will improve Outcome: Progressing   Problem: Education: Goal: Knowledge  of the prescribed therapeutic regimen will improve Outcome: Progressing   Problem: Clinical Measurements: Goal: Usual level of consciousness will be regained or maintained. Outcome: Progressing Goal: Neurologic status will improve Outcome: Progressing Goal: Ability to maintain intracranial pressure will improve Outcome: Progressing   Problem: Skin Integrity: Goal: Demonstration of wound healing without infection will improve Outcome: Progressing   Problem: Clinical Measurements: Goal: Will remain free from infection Outcome: Not Progressing   Problem: Elimination: Goal: Will not experience complications related to bowel motility Outcome: Not Progressing

## 2023-10-08 NOTE — Progress Notes (Signed)
   Providing Compassionate, Quality Care - Together  NEUROSURGERY PROGRESS NOTE   S: No issues overnight.   O: EXAM:  BP (!) 146/87 (BP Location: Left Arm)   Pulse 61   Temp 97.6 F (36.4 C) (Axillary)   Resp (!) 21   Ht 5\' 6"  (1.676 m)   Wt 48 kg   LMP 06/06/2010   SpO2 99%   BMI 17.08 kg/m   Awake, alert, expressive aphasia PERRL Speech fluent, appropriate  CNs grossly intact  Left upper extremity, bilateral lower extremities move spontaneously Right neglect Incision clean dry and intact  ASSESSMENT:  58 y.o. female with   Left frontal lesion status post craniotomy for resection  PLAN: -Intraoperative findings concerning for abscess, culture sent, awaiting results -Continue supportive measures -Therapies   Thank you for allowing me to participate in this patient's care.  Please do not hesitate to call with questions or concerns.   Monia Pouch, DO Neurosurgeon [facility] [phone]

## 2023-10-08 NOTE — Progress Notes (Signed)
Patient ID: Regina [name], female   DOB: [birth year], 58 y.o.   MRN: [id number] BP (!) 146/87 (BP Location: Left Arm)   Pulse 61   Temp 97.6 F (36.4 C) (Axillary)   Resp (!) 21   Ht 5\' 6"  (1.676 m)   Wt 48 kg   LMP 06/06/2010   SpO2 99%   BMI 17.08 kg/m  Alert, oriented to person. Following commands Wound is clean, dry, no signs of infecttion Moving left side well, paIn induced weakness right upper extremity, pathological fracture right humerus.  Will transfer today Perrl, full eom Tongue and uvula midline Culture showing white cells, no organisms.

## 2023-10-09 LAB — BASIC METABOLIC PANEL
Anion gap: 9 (ref 5–15)
BUN: 17 mg/dL (ref 6–20)
CO2: 25 mmol/L (ref 22–32)
Calcium: 8.9 mg/dL (ref 8.9–10.3)
Chloride: 96 mmol/L — ABNORMAL LOW (ref 98–111)
Creatinine, Ser: 0.66 mg/dL (ref 0.44–1.00)
GFR, Estimated: >60 mL/min (ref >60)
Glucose, Bld: 131 mg/dL — ABNORMAL HIGH (ref 70–99)
Potassium: 4.7 mmol/L (ref 3.5–5.1)
Sodium: 130 mmol/L — ABNORMAL LOW (ref 135–145)

## 2023-10-09 LAB — CBC
HCT: 44.8 % (ref 36.0–46.0)
Hemoglobin: 15.3 g/dL — ABNORMAL HIGH (ref 12.0–15.0)
MCH: 29.8 pg (ref 26.0–34.0)
MCHC: 34.2 g/dL (ref 30.0–36.0)
MCV: 87.2 fL (ref 80.0–100.0)
Platelets: 436 10*3/uL — ABNORMAL HIGH (ref 150–400)
RBC: 5.14 MIL/uL — ABNORMAL HIGH (ref 3.87–5.11)
RDW: 14.1 % (ref 11.5–15.5)
WBC: 21.8 10*3/uL — ABNORMAL HIGH (ref 4.0–10.5)
nRBC: 0 % (ref 0.0–0.2)

## 2023-10-09 MED ORDER — PANTOPRAZOLE SODIUM 40 MG PO TBEC
40.0000 mg | DELAYED_RELEASE_TABLET | Freq: Every day | ORAL | Status: DC
  Administered 2023-10-09 – 2023-10-13 (×4): 40 mg via ORAL
  Filled 2023-10-09 (×5): qty 1

## 2023-10-09 NOTE — Progress Notes (Signed)
    Providing Compassionate, Quality Care - Together   NEUROSURGERY PROGRESS NOTE     S: NAEs o/n.    O: EXAM:  BP (!) 145/87 (BP Location: Left Arm)   Pulse 68   Temp 98 F (36.7 C) (Oral)   Resp 18   Ht 5\' 6"  (1.676 m)   Wt 48 kg   LMP 06/06/2010   SpO2 98%   BMI 17.08 kg/m     Awake, alert, oriented  Expressive aphasia  CNs grossly intact  Incision c/d/I LUE/BLE move spontaneously RUE in sling, minimal movement   ASSESSMENT:  58 y.o. with  - Left frontal lesion status post craniotomy for resection   PLAN: -Therapies as tolerated -Cont supportive care -Call w/ questions/concerns.   Patrici Ranks, [facility]

## 2023-10-09 NOTE — Progress Notes (Signed)
PHARMACIST - PHYSICIAN COMMUNICATION  DR:   Franky Macho  CONCERNING: IV to Oral Route Change Policy  RECOMMENDATION: This patient is receiving Protonix by the intravenous route.  Based on criteria approved by the Pharmacy and Therapeutics Committee, the intravenous medication(s) is/are being converted to the equivalent oral dose form(s).   DESCRIPTION: These criteria include: The patient is eating (either orally or via tube) and/or has been taking other orally administered medications for a least 24 hours The patient has no evidence of active gastrointestinal bleeding or impaired GI absorption (gastrectomy, short bowel, patient on TNA or NPO).  If you have questions about this conversion, please contact the Pharmacy Department  []   [phone] )  [facility] []   [phone] )  [facility] [x]   [phone] )  [facility] []   [phone] )  [facility] []   [phone] )  [facility]

## 2023-10-09 NOTE — Progress Notes (Signed)
Physical Therapy Re-Evaluation Patient Details Name: Regina [name] MRN: [id number] DOB: Oct 29, 1965 Today's Date: 10/09/2023  History of Present Illness  58 yo female who was admitted to [facility] on 10/01/2023 due to difficulty speaking.  Had XRT for brain tumor, then transferred to [facility] on 10/06/23 and underwent frontal craniotomy on 10/07/23 due to brain mass/abcess.  Pt was hospitalized 1/11-1/15/2025 secondary to metastatic lung ca to brain and R UE weakness with pathologic humerus fx. Pt was set up for OP PET, radiation and orthopedic but did not follow up.  Pt PMH includes but is not limited to: allergy, anxiety, brain mass, COPD, emphysema, HTN, and adenocarcinoma of lung.  Clinical Impression  Patient presents with mobility mainly limited due to communication and cognition.  Patient with fatigue this pm, and either unable to or unwilling to work on verbalizing or reading signs in hallway or noting colors despite repeated questioning.  Patient frustrated initially with communicating her living situation and agreed when asked if hard to find the words.  She mobilized with S throughout for safety due to impulsivity initially with lines.  She toileted unaided in bathroom.  Note visitors arrived end of session, but pt to bed and did not regard them.  Feel she is unsafe to provide for herself and her brother.  If niece can provide assistance for them both for medications/appointments/ensuring daily care needs follow through, could go home, though if not may need inpatient rehab.  PT will continue to follow in the acute setting.         If plan is discharge home, recommend the following: Supervision due to cognitive status;A little help with bathing/dressing/bathroom;Assistance with cooking/housework;Assist for transportation;Help with stairs or ramp for entrance   Can travel by private vehicle   Yes    Equipment Recommendations None recommended by PT  Recommendations for Other Services       Functional  Status Assessment Patient has had a recent decline in their functional status and demonstrates the ability to make significant improvements in function in a reasonable and predictable amount of time.     Precautions / Restrictions Precautions Precautions: Fall Precaution Comments: aphasia, decreased cog Required Braces or Orthoses: Sling Restrictions Weight Bearing Restrictions Per Provider Order: Yes RUE Weight Bearing Per Provider Order: Non weight bearing Other Position/Activity Restrictions: patient with fracutre of RUE with pending possible surgical intervention per chart.      Mobility  Bed Mobility Overal bed mobility: Modified Independent                  Transfers Overall transfer level: Needs assistance Equipment used: None Transfers: Sit to/from Stand Sit to Stand: Supervision           General transfer comment: assist for safety and lines; pt impulsive    Ambulation/Gait Ambulation/Gait assistance: Supervision Gait Distance (Feet): 150 Feet Assistive device: None Gait Pattern/deviations: Step-through pattern, Decreased stride length       General Gait Details: assist for directions, finding room after hallway ambulation  Stairs            Wheelchair Mobility     Tilt Bed    Modified Rankin (Stroke Patients Only)       Balance Overall balance assessment: No apparent balance deficits (not formally assessed)  Pertinent Vitals/Pain Pain Assessment Pain Assessment: Faces Faces Pain Scale: Hurts even more Pain Location: RUE with movement Pain Descriptors / Indicators: Discomfort, Grimacing Pain Intervention(s): Limited activity within patient's tolerance, Monitored during session, Repositioned    Home Living Family/patient expects to be discharged to:: Private residence Living Arrangements: Children Available Help at Discharge: Family;Available PRN/intermittently Type of  Home: Apartment Home Access: Level entry       Home Layout: One level Home Equipment: None Additional Comments: 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reported having niece who helps with brother and her IADLs since last hospitalization; patient unable to state information so information from previous chart    Prior Function Prior Level of Function : Independent/Modified Independent;Working/employed;Driving             Mobility Comments: no AD ADLs Comments: works for [employer], 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     Extremity/Trunk Assessment   Upper Extremity Assessment Upper Extremity Assessment: RUE deficits/detail RUE Deficits / Details: pt with humerus fx and in sling RUE: Unable to fully assess due to immobilization;Unable to fully assess due to pain    Lower Extremity Assessment Lower Extremity Assessment: Overall WFL for tasks assessed    Cervical / Trunk Assessment Cervical / Trunk Assessment: Normal  Communication   Communication Communication: Difficulty communicating thoughts/reduced clarity of speech;Difficulty following commands/understanding Following commands: Follows one step commands with increased time;Follows one step commands consistently (and multimodal cues)  Cognition Arousal: Alert Behavior During Therapy: Flat affect Overall Cognitive Status: Difficult to assess                                          General Comments General comments (skin integrity, edema, etc.): VSS on RA; completed toileting in bathroom unaided    Exercises     Assessment/Plan    PT Assessment Patient needs continued PT services  PT Problem List Decreased cognition;Decreased safety awareness;Decreased mobility;Decreased activity tolerance       PT Treatment Interventions Functional mobility training;Balance training;Patient/family education;Therapeutic activities;Gait training;Stair training;Cognitive remediation;Therapeutic exercise    PT  Goals (Current goals can be found in the Care Plan section)  Acute Rehab PT Goals Patient Stated Goal: unable to state PT Goal Formulation: Patient unable to participate in goal setting Time For Goal Achievement: 10/23/23 Potential to Achieve Goals: Good    Frequency Min 1X/week     Co-evaluation               AM-PAC PT "6 Clicks" Mobility  Outcome Measure Help needed turning from your back to your side while in a flat bed without using bedrails?: None Help needed moving from lying on your back to sitting on the side of a flat bed without using bedrails?: None Help needed moving to and from a bed to a chair (including a wheelchair)?: A Little Help needed standing up from a chair using your arms (e.g., wheelchair or bedside chair)?: A Little Help needed to walk in hospital room?: A Little Help needed climbing 3-5 steps with a railing? : A Little 6 Click Score: 20    End of Session Equipment Utilized During Treatment: Gait belt;Other (comment) (sling) Activity Tolerance: Patient limited by fatigue Patient left: in bed;with call bell/phone within reach;with bed alarm set;with family/visitor present   PT Visit Diagnosis: Difficulty in walking, not elsewhere classified (R26.2)    Time: 4401-0272 PT Time Calculation (min) (ACUTE ONLY):  24 min   Charges:   PT Evaluation $PT Re-evaluation: 1 Re-eval PT Treatments $Gait Training: 8-22 mins PT General Charges $$ ACUTE PT VISIT: 1 Visit         Sheran Lawless, PT Acute Rehabilitation Services [phone] 10/09/2023   Elray Mcgregor 10/09/2023, 4:44 PM

## 2023-10-10 ENCOUNTER — Other Ambulatory Visit: Payer: Self-pay

## 2023-10-10 ENCOUNTER — Encounter (HOSPITAL_COMMUNITY): Payer: Self-pay | Admitting: Neurosurgery

## 2023-10-10 ENCOUNTER — Encounter (HOSPITAL_COMMUNITY)
Admission: EM | Disposition: A | Discharge status: Home / Self Care | Payer: Self-pay | Source: Home / Self Care | Attending: Neurosurgery

## 2023-10-10 ENCOUNTER — Inpatient Hospital Stay (HOSPITAL_COMMUNITY): Payer: MEDICAID | Admitting: Anesthesiology

## 2023-10-10 ENCOUNTER — Inpatient Hospital Stay (HOSPITAL_COMMUNITY): Payer: MEDICAID

## 2023-10-10 DIAGNOSIS — S42301A Unspecified fracture of shaft of humerus, right arm, initial encounter for closed fracture: Secondary | ICD-10-CM | POA: Diagnosis not present

## 2023-10-10 DIAGNOSIS — Z87891 Personal history of nicotine dependence: Secondary | ICD-10-CM

## 2023-10-10 DIAGNOSIS — C3411 Malignant neoplasm of upper lobe, right bronchus or lung: Secondary | ICD-10-CM | POA: Diagnosis not present

## 2023-10-10 DIAGNOSIS — J449 Chronic obstructive pulmonary disease, unspecified: Secondary | ICD-10-CM

## 2023-10-10 DIAGNOSIS — L8990 Pressure ulcer of unspecified site, unspecified stage: Secondary | ICD-10-CM | POA: Insufficient documentation

## 2023-10-10 DIAGNOSIS — S42391A Other fracture of shaft of right humerus, initial encounter for closed fracture: Secondary | ICD-10-CM

## 2023-10-10 DIAGNOSIS — I10 Essential (primary) hypertension: Secondary | ICD-10-CM

## 2023-10-10 HISTORY — PX: HUMERUS IM NAIL: SHX1769

## 2023-10-10 LAB — VITAMIN B1: Vitamin B1 (Thiamine): 152.1 nmol/L (ref 66.5–200.0)

## 2023-10-10 SURGERY — INSERTION, INTRAMEDULLARY ROD, HUMERUS
Anesthesia: General | Site: Arm Upper | Laterality: Right

## 2023-10-10 MED ORDER — BUPIVACAINE LIPOSOME 1.3 % IJ SUSP
INTRAMUSCULAR | Status: DC | PRN
  Administered 2023-10-10: 10 mL via PERINEURAL

## 2023-10-10 MED ORDER — LACTATED RINGERS IV SOLN: INTRAVENOUS | Status: DC

## 2023-10-10 MED ORDER — PHENYLEPHRINE HCL-NACL 20-0.9 MG/250ML-% IV SOLN
INTRAVENOUS | Status: DC | PRN
  Administered 2023-10-10: 30 ug/min via INTRAVENOUS

## 2023-10-10 MED ORDER — SUGAMMADEX SODIUM 200 MG/2ML IV SOLN
INTRAVENOUS | Status: DC | PRN
  Administered 2023-10-10: 100 mg via INTRAVENOUS

## 2023-10-10 MED ORDER — ROCURONIUM BROMIDE 10 MG/ML (PF) SYRINGE
PREFILLED_SYRINGE | INTRAVENOUS | Status: AC
  Filled 2023-10-10: qty 10

## 2023-10-10 MED ORDER — DEXAMETHASONE SODIUM PHOSPHATE 10 MG/ML IJ SOLN
INTRAMUSCULAR | Status: DC | PRN
  Administered 2023-10-10: 5 mg via INTRAVENOUS

## 2023-10-10 MED ORDER — 0.9 % SODIUM CHLORIDE (POUR BTL) OPTIME
TOPICAL | Status: DC | PRN
  Administered 2023-10-10: 1000 mL

## 2023-10-10 MED ORDER — ORAL CARE MOUTH RINSE: 15.0000 mL | Freq: Once | OROMUCOSAL | Status: AC

## 2023-10-10 MED ORDER — ROCURONIUM BROMIDE 10 MG/ML (PF) SYRINGE
PREFILLED_SYRINGE | INTRAVENOUS | Status: DC | PRN
  Administered 2023-10-10: 40 mg via INTRAVENOUS
  Administered 2023-10-10: 20 mg via INTRAVENOUS

## 2023-10-10 MED ORDER — PROPOFOL 10 MG/ML IV BOLUS
INTRAVENOUS | Status: AC
  Filled 2023-10-10: qty 20

## 2023-10-10 MED ORDER — PHENYLEPHRINE HCL (PRESSORS) 10 MG/ML IV SOLN
INTRAVENOUS | Status: AC
  Filled 2023-10-10: qty 1

## 2023-10-10 MED ORDER — PHENYLEPHRINE 80 MCG/ML (10ML) SYRINGE FOR IV PUSH (FOR BLOOD PRESSURE SUPPORT)
PREFILLED_SYRINGE | INTRAVENOUS | Status: AC
  Filled 2023-10-10: qty 10

## 2023-10-10 MED ORDER — FENTANYL CITRATE (PF) 100 MCG/2ML IJ SOLN: 25.0000 ug | INTRAMUSCULAR | Status: DC | PRN

## 2023-10-10 MED ORDER — CHLORHEXIDINE GLUCONATE 0.12 % MT SOLN
OROMUCOSAL | Status: AC
  Administered 2023-10-10: 15 mL via OROMUCOSAL
  Filled 2023-10-10: qty 15

## 2023-10-10 MED ORDER — LIDOCAINE 2% (20 MG/ML) 5 ML SYRINGE
INTRAMUSCULAR | Status: DC | PRN
  Administered 2023-10-10: 40 mg via INTRAVENOUS

## 2023-10-10 MED ORDER — FENTANYL CITRATE (PF) 100 MCG/2ML IJ SOLN
INTRAMUSCULAR | Status: AC
  Administered 2023-10-10: 50 ug
  Filled 2023-10-10: qty 2

## 2023-10-10 MED ORDER — ESMOLOL HCL 100 MG/10ML IV SOLN
INTRAVENOUS | Status: AC
  Filled 2023-10-10: qty 10

## 2023-10-10 MED ORDER — ESMOLOL HCL 100 MG/10ML IV SOLN
INTRAVENOUS | Status: DC | PRN
  Administered 2023-10-10: 50 mg via INTRAVENOUS
  Administered 2023-10-10: 20 mg via INTRAVENOUS

## 2023-10-10 MED ORDER — MIDAZOLAM HCL 2 MG/2ML IJ SOLN
INTRAMUSCULAR | Status: AC
Start: 2023-10-10 — End: ?
  Filled 2023-10-10: qty 2

## 2023-10-10 MED ORDER — BUPIVACAINE LIPOSOME 1.3 % IJ SUSP
INTRAMUSCULAR | Status: AC
  Filled 2023-10-10: qty 10

## 2023-10-10 MED ORDER — PHENYLEPHRINE 80 MCG/ML (10ML) SYRINGE FOR IV PUSH (FOR BLOOD PRESSURE SUPPORT)
PREFILLED_SYRINGE | INTRAVENOUS | Status: DC | PRN
  Administered 2023-10-10: 160 ug via INTRAVENOUS
  Administered 2023-10-10: 240 ug via INTRAVENOUS

## 2023-10-10 MED ORDER — ONDANSETRON HCL 4 MG/2ML IJ SOLN
INTRAMUSCULAR | Status: DC | PRN
  Administered 2023-10-10: 4 mg via INTRAVENOUS

## 2023-10-10 MED ORDER — BUPIVACAINE HCL (PF) 0.5 % IJ SOLN
INTRAMUSCULAR | Status: DC | PRN
  Administered 2023-10-10: 10 mL via PERINEURAL

## 2023-10-10 MED ORDER — CHLORHEXIDINE GLUCONATE 0.12 % MT SOLN: 15.0000 mL | Freq: Once | OROMUCOSAL | Status: AC

## 2023-10-10 MED ORDER — ONDANSETRON HCL 4 MG/2ML IJ SOLN
INTRAMUSCULAR | Status: AC
  Filled 2023-10-10: qty 2

## 2023-10-10 MED ORDER — IOHEXOL 350 MG/ML SOLN
75.0000 mL | Freq: Once | INTRAVENOUS | Status: AC | PRN
  Administered 2023-10-10: 75 mL via INTRAVENOUS

## 2023-10-10 MED ORDER — FENTANYL CITRATE PF 50 MCG/ML IJ SOSY: 50.0000 ug | PREFILLED_SYRINGE | Freq: Once | INTRAMUSCULAR | Status: DC

## 2023-10-10 MED ORDER — FENTANYL CITRATE (PF) 250 MCG/5ML IJ SOLN
INTRAMUSCULAR | Status: AC
  Filled 2023-10-10: qty 5

## 2023-10-10 MED ORDER — LIDOCAINE 2% (20 MG/ML) 5 ML SYRINGE
INTRAMUSCULAR | Status: AC
  Filled 2023-10-10: qty 5

## 2023-10-10 MED ORDER — ONDANSETRON HCL 4 MG/2ML IJ SOLN: 4.0000 mg | Freq: Once | INTRAMUSCULAR | Status: DC | PRN

## 2023-10-10 MED ORDER — PROPOFOL 10 MG/ML IV BOLUS
INTRAVENOUS | Status: DC | PRN
  Administered 2023-10-10: 80 mg via INTRAVENOUS

## 2023-10-10 MED ORDER — FENTANYL CITRATE (PF) 250 MCG/5ML IJ SOLN
INTRAMUSCULAR | Status: DC | PRN
  Administered 2023-10-10 (×4): 25 ug via INTRAVENOUS
  Administered 2023-10-10: 50 ug via INTRAVENOUS

## 2023-10-10 SURGICAL SUPPLY — 50 items
BAG COUNTER SPONGE SURGICOUNT (BAG) ×1 IMPLANT
BIT DRILL LNG GRA BRD PT 3.2MM (BIT) IMPLANT
BIT DRILL SHORT GRAD 3.2MM (BIT) IMPLANT
BNDG ELASTIC 4INX 5YD STR LF (GAUZE/BANDAGES/DRESSINGS) IMPLANT
BRUSH SCRUB EZ PLAIN DRY (MISCELLANEOUS) ×2 IMPLANT
COVER SURGICAL LIGHT HANDLE (MISCELLANEOUS) ×2 IMPLANT
DRAPE C-ARM 42X72 X-RAY (DRAPES) ×1 IMPLANT
DRAPE C-ARMOR (DRAPES) ×1 IMPLANT
DRAPE IMP U-DRAPE 54X76 (DRAPES) ×1 IMPLANT
DRAPE INCISE IOBAN 66X45 STRL (DRAPES) ×1 IMPLANT
DRAPE U-SHAPE 47X51 STRL (DRAPES) ×1 IMPLANT
DRILL LONG GRAD BRAD PT 3.2MM (BIT) ×1 IMPLANT
DRILL SHORT GRAD 3.2MM (BIT) ×2 IMPLANT
DRSG ADAPTIC 3X8 NADH LF (GAUZE/BANDAGES/DRESSINGS) ×1 IMPLANT
ELECT REM PT RETURN 9FT ADLT (ELECTROSURGICAL) ×1 IMPLANT
ELECTRODE REM PT RTRN 9FT ADLT (ELECTROSURGICAL) IMPLANT
GAUZE PAD ABD 8X10 STRL (GAUZE/BANDAGES/DRESSINGS) ×3 IMPLANT
GAUZE SPONGE 4X4 12PLY STRL (GAUZE/BANDAGES/DRESSINGS) ×1 IMPLANT
GAUZE XEROFORM 5X9 LF (GAUZE/BANDAGES/DRESSINGS) IMPLANT
GLOVE BIO SURGEON STRL SZ7.5 (GLOVE) ×1 IMPLANT
GLOVE BIO SURGEON STRL SZ8 (GLOVE) ×1 IMPLANT
GLOVE BIOGEL PI IND STRL 7.5 (GLOVE) ×1 IMPLANT
GLOVE BIOGEL PI IND STRL 8 (GLOVE) ×1 IMPLANT
GLOVE SURG ORTHO LTX SZ7.5 (GLOVE) ×2 IMPLANT
GOWN STRL REUS W/ TWL LRG LVL3 (GOWN DISPOSABLE) ×2 IMPLANT
GOWN STRL REUS W/ TWL XL LVL3 (GOWN DISPOSABLE) ×1 IMPLANT
GUIDE ROD BALL TIP 2.0 DISP (ORTHOPEDIC DISPOSABLE SUPPLIES) ×1 IMPLANT
GUIDEWIRE TROCAR TRIG 3.2 DISP (WIRE) IMPLANT
KIT BASIN OR (CUSTOM PROCEDURE TRAY) ×1 IMPLANT
KIT TURNOVER KIT B (KITS) ×1 IMPLANT
MANIFOLD NEPTUNE II (INSTRUMENTS) ×1 IMPLANT
NAIL CANN IM AR TRIG 8/7X260 (Nail) IMPLANT
NS IRRIG 1000ML POUR BTL (IV SOLUTION) ×1 IMPLANT
PACK TOTAL JOINT (CUSTOM PROCEDURE TRAY) ×1 IMPLANT
PACK UNIVERSAL I (CUSTOM PROCEDURE TRAY) ×1 IMPLANT
PAD ARMBOARD 7.5X6 YLW CONV (MISCELLANEOUS) ×2 IMPLANT
PADDING CAST SYNTHETIC 4X4 STR (CAST SUPPLIES) IMPLANT
PIN TIP THREADED GUIDE 3.2 (PIN) IMPLANT
ROD GUIDE BALL TIP 2.0 DISP (ORTHOPEDIC DISPOSABLE SUPPLIES) IMPLANT
SCREW LOCK CANC ST TRI 5X36 (Screw) IMPLANT
SCREW LOCK CANC ST TRIG 5X40 (Screw) IMPLANT
SCREW SELFTAP TI 4.0X22 (Screw) IMPLANT
STAPLER VISISTAT 35W (STAPLE) ×1 IMPLANT
SUCTION TUBE FRAZIER 10FR DISP (SUCTIONS) ×1 IMPLANT
SUT ETHILON 3 0 PS 1 (SUTURE) IMPLANT
SUT FIBERWIRE 2-0 18 17.9 3/8 (SUTURE) ×1 IMPLANT
SUT VIC AB 2-0 CT3 27 (SUTURE) IMPLANT
SUTURE FIBERWR 2-0 18 17.9 3/8 (SUTURE) IMPLANT
WATER STERILE IRR 1000ML POUR (IV SOLUTION) ×1 IMPLANT
YANKAUER SUCT BULB TIP NO VENT (SUCTIONS) ×1 IMPLANT

## 2023-10-10 NOTE — Progress Notes (Signed)
Occupational Therapy Treatment Patient Details Name: Regina [name] MRN: [id number] DOB: [birth year] Today's Date: 10/10/2023   History of present illness 58 yo female who was admitted to [facility] on 10/01/2023 due to difficulty speaking.  Had XRT for brain tumor, then transferred to [facility] on 10/06/23 and underwent frontal craniotomy on 10/07/23 due to brain mass/abcess.  Pt was hospitalized 1/11-1/15/2025 secondary to metastatic lung ca to brain and R UE weakness with pathologic humerus fx. Pt was set up for OP PET, radiation and orthopedic but did not follow up.  Pt PMH includes but is not limited to: allergy, anxiety, brain mass, COPD, emphysema, HTN, and adenocarcinoma of lung.   OT comments  Pt with expressive communication deficits but when given options able to correctly pick the right answer. Pt completed bathroom transfer on arrival. Pt demonstrates adjust bil socks with figure 4 cross. Pt able to transfer supervision at this time. Pt min (A) to adjust R sling. OT to follow acutely and await updated orders for R UE s/p procedure. Pt likely d/c home with outpatient OT shoulder needs per MD orders.       If plan is discharge home, recommend the following:  A little help with bathing/dressing/bathroom;Assist for transportation;Assistance with cooking/housework   Equipment Recommendations  None recommended by OT    Recommendations for Other Services      Precautions / Restrictions Precautions Precautions: Fall Required Braces or Orthoses: Sling Restrictions Weight Bearing Restrictions Per Provider Order: Yes RUE Weight Bearing Per Provider Order: Non weight bearing Other Position/Activity Restrictions: R UE fx pending OR 10/10/23 PM       Mobility Bed Mobility Overal bed mobility: Modified Independent                  Transfers Overall transfer level: Needs assistance   Transfers: Sit to/from Stand Sit to Stand: Supervision                 Balance                                            ADL either performed or assessed with clinical judgement   ADL Overall ADL's : Needs assistance/impaired                     Lower Body Dressing: Supervision/safety Lower Body Dressing Details (indicate cue type and reason): able to figure 4 cross Toilet Transfer: Supervision/safety   Toileting- Architect and Hygiene: Supervision/safety Toileting - Clothing Manipulation Details (indicate cue type and reason): using L UE     Functional mobility during ADLs: Supervision/safety      Extremity/Trunk Assessment Upper Extremity Assessment Upper Extremity Assessment: RUE deficits/detail RUE Deficits / Details: sling adjusted for good alignment at this time   Lower Extremity Assessment Lower Extremity Assessment: Overall WFL for tasks assessed        Vision       Perception     Praxis      Cognition Arousal: Alert Behavior During Therapy: Flat affect Overall Cognitive Status: Difficult to assess                                 General Comments: pt correct verbalizes colors quickly without expressive difficulty. pt with word finding deficits with showing object to name. pt given two choices  able to verbalize correct option.        Exercises      Shoulder Instructions       General Comments VSS on RA    Pertinent Vitals/ Pain       Pain Assessment Pain Assessment: No/denies pain Pain Intervention(s): Monitored during session, Repositioned  Home Living                                          Prior Functioning/Environment              Frequency  Min 1X/week        Progress Toward Goals  OT Goals(current goals can now be found in the care plan section)  Progress towards OT goals: Progressing toward goals  Acute Rehab OT Goals Patient Stated Goal: none stated OT Goal Formulation: Patient unable to participate in goal setting Time For Goal Achievement:  10/17/23 Potential to Achieve Goals: Fair ADL Goals Pt Will Perform Grooming: with modified independence;standing Pt Will Perform Upper Body Bathing: with supervision;sitting Pt Will Perform Upper Body Dressing: with modified independence;sitting Pt Will Perform Lower Body Dressing: with modified independence;sit to/from stand Pt Will Transfer to Toilet: with modified independence;ambulating Pt Will Perform Toileting - Clothing Manipulation and hygiene: with modified independence;sit to/from stand Additional ADL Goal #1: patient to complete ADLs while maintaining shoulder precautions with supevision to return to PLOF  Plan      Co-evaluation                 AM-PAC OT "6 Clicks" Daily Activity     Outcome Measure   Help from another person eating meals?: A Little Help from another person taking care of personal grooming?: A Little Help from another person toileting, which includes using toliet, bedpan, or urinal?: A Little Help from another person bathing (including washing, rinsing, drying)?: A Little Help from another person to put on and taking off regular upper body clothing?: A Little Help from another person to put on and taking off regular lower body clothing?: A Little 6 Click Score: 18    End of Session    OT Visit Diagnosis: Pain;Other symptoms and signs involving cognitive function;Cognitive communication deficit (R41.841) Symptoms and signs involving cognitive functions: Other cerebrovascular disease Pain - Right/Left: Right Pain - part of body: Arm   Activity Tolerance Patient tolerated treatment well   Patient Left Other (comment) (in w/c for transport to CT)   Nurse Communication Mobility status;Precautions        Time: 1356 (1356)-1410 OT Time Calculation (min): 14 min  Charges: OT General Charges $OT Visit: 1 Visit OT Treatments $Self Care/Home Management : 8-22 mins   Brynn, OTR/L  Acute Rehabilitation Services Office:  [phone] .   Mateo Flow 10/10/2023, 2:17 PM

## 2023-10-10 NOTE — H&P (View-Only) (Signed)
ORTHOPAEDIC CONSULTATION  REQUESTING PHYSICIAN: Regina Memos, MD  Chief Complaint: Right pathological humerus fracture  HPI: Regina [name] is a 58 y.o. 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  This time she is admitted to the hospital and has undergone craniotomy for resection of possible brain metastasis versus infection.  Orthopedics consulted as she does have persistent lucency the setting of metastatic lesions of the midshaft humerus which had previously been biopsied.  This been found to be adenocarcinoma metastasis.  This time she has limited motion due to the pathological nature of the fracture.  This is her dominant hand.  Past Medical History:  Diagnosis Date   Allergy    Anxiety    Brain mass    COPD (chronic obstructive pulmonary disease) (HCC)    Emphysema of lung (HCC)    Hypertension    Past Surgical History:  Procedure Laterality Date   APPLICATION OF CRANIAL NAVIGATION Left 10/07/2023   Procedure: APPLICATION OF CRANIAL NAVIGATION;  Surgeon: Regina Memos, MD;  Location: [facility] OR;  Service: Neurosurgery;  Laterality: Left;   CRANIOTOMY Left 10/07/2023   Procedure: FRONTAL CRANIOTOMY FOR ABSCESS;  Surgeon: Regina Memos, MD;  Location: [facility] OR;  Service: Neurosurgery;  Laterality: Left;   dental procedure     Social History   Socioeconomic History   Marital status: Widowed    Spouse name: Not on file   Number of children: 1    Years of education: 65    Highest education level: Not on file  Occupational History   Occupation: Set designer: [name]    Comment: Nurse, mental health   Tobacco Use   Smoking status: Former    Current packs/day: 0.00    Types: Cigarettes    Quit date: 10/31/2019    Years since quitting: 3.9   Smokeless tobacco: Never  Vaping Use   Vaping status: Never Used  Substance and Sexual Activity   Alcohol use: Yes     Alcohol/week: 6.0 standard drinks of alcohol    Types: 6 Cans of beer per week   Drug use: Yes    Frequency: 4.0 times per week    Types: Marijuana   Sexual activity: Not Currently  Other Topics Concern   Not on file  Social History Narrative   Lives with her brother.    Son lives near by, 29 yo, married.    Social Drivers of Corporate investment banker Strain: Not on file  Food Insecurity: No Food Insecurity (09/21/2023)   Hunger Vital Sign    Worried About Running Out of Food in the Last Year: Never true    Ran Out of Food in the Last Year: Never true  Transportation Needs: No Transportation Needs (09/21/2023)   PRAPARE - Administrator, Civil Service (Medical): No    Lack of Transportation (Non-Medical): No  Physical Activity: Not on file  Stress: Not on file  Social Connections: Not on file   Family History  Problem Relation Age of Onset   Leukemia Mother    Hypertension Mother    Hypertension Father    Cancer Neg Hx    Diabetes Neg Hx    Heart disease Neg Hx    Colon cancer Neg Hx    Esophageal cancer Neg Hx    Rectal cancer Neg Hx    Stomach cancer Neg Hx    -  negative except otherwise stated in the family history section Allergies  Allergen Reactions   Pork Allergy Nausea And Vomiting   Bee Venom Hives   Prior to Admission medications   Medication Sig Start Date End Date Taking? Authorizing Provider  acetaminophen (TYLENOL) 325 MG tablet Take 2 tablets (650 mg total) by mouth every 6 (six) hours as needed for mild pain (pain score 1-3) (or Fever >/= 101). 09/20/23  Yes Azucena Fallen, MD  levETIRAcetam (KEPPRA) 500 MG tablet Take 1 tablet (500 mg total) by mouth 2 (two) times daily. 09/20/23  Yes Azucena Fallen, MD  oxyCODONE (ROXICODONE) 5 MG immediate release tablet Take 1 tablet (5 mg total) by mouth every 4 (four) hours as needed for severe pain (pain score 7-10). 09/20/23  Yes Azucena Fallen, MD   No results  found.   Positive ROS: All other systems have been reviewed and were otherwise negative with the exception of those mentioned in the HPI and as above.  Physical Exam: General: No acute distress Cardiovascular: No pedal edema Respiratory: No cyanosis, no use of accessory musculature GI: No organomegaly, abdomen is soft and non-tender Skin: No lesions in the area of chief complaint Neurologic: Sensation intact distally Psychiatric: Patient is at baseline mood and affect Lymphatic: No axillary or cervical lymphadenopathy  MUSCULOSKELETAL:   Right arm is in a sling.  Fires EPL as well as wrist extensors and flexors.  Sensation is intact in all distributions of the right hand including the radial distribution with 2+ radial pulse.  Deformity about the wrist  Independent Imaging Review: X-rays humerus 2 views: Lytic appearing lesion of the mid humerus with pathological fracture associated.  Assessment: 58 year old female with a metastatic pathological fracture of the midshaft humerus in the setting of adenocarcinoma of the lung.  Given the fact that she does have quite limited use of the right arm in the setting of a metastatic lesion I did discuss with her the role for humeral nailing.  Ultimately I do believe this would give her the best option to allow for some quality of life and mobilization of the right arm.  I did discuss specifically intramedullary nailing to protect the entirety of the bone.  Ultimately I do believe she would need radiation as well after her incision heals.  Plan for n.p.o. today for right humeral nailing  Plan: Plan for right humeral nailing today   After a lengthy discussion of treatment options, including risks, benefits, alternatives, complications of surgical and nonsurgical conservative options, the patient elected surgical repair.   The patient  is aware of the material risks  and complications including, but not limited to injury to adjacent structures,  neurovascular injury, infection, numbness, bleeding, implant failure, thermal burns, stiffness, persistent pain, failure to heal, disease transmission from allograft, need for further surgery, dislocation, anesthetic risks, blood clots, risks of death,and others. The probabilities of surgical success and failure discussed with patient given their particular co-morbidities.The time and nature of expected rehabilitation and recovery was discussed.The patient's questions were all answered preoperatively.  No barriers to understanding were noted. I explained the natural history of the disease process and Rx rationale.  I explained to the patient what I considered to be reasonable expectations given their personal situation.  The final treatment plan was arrived at through a shared patient decision making process model.   Thank you for the consult and the opportunity to see Ms. Regina Lauth, MD [facility] 7:51 AM

## 2023-10-10 NOTE — Transfer of Care (Signed)
Immediate Anesthesia Transfer of Care Note  Patient: Regina [name]  Procedure(s) Performed: INTRAMEDULLARY (IM) NAIL HUMERAL RIGHT (Right: Arm Upper)  Patient Location: PACU  Anesthesia Type:GA combined with regional for post-op pain  Level of Consciousness: awake and alert   Airway & Oxygen Therapy: Patient Spontanous Breathing and Patient connected to face mask oxygen  Post-op Assessment: Report given to RN and Post -op Vital signs reviewed and stable  Post vital signs: Reviewed and stable  Last Vitals:  Vitals Value Taken Time  BP 137/84 10/10/23 1747  Temp    Pulse 71 10/10/23 1753  Resp 13 10/10/23 1753  SpO2 97 % 10/10/23 1753  Vitals shown include unfiled device data.  Last Pain:  Vitals:   10/10/23 1545  TempSrc:   PainSc: 0-No pain      Patients Stated Pain Goal: 2 (10/08/23 1946)  Complications: No notable events documented.

## 2023-10-10 NOTE — Progress Notes (Signed)
Patient ID: Regina [name], female   DOB: July 06, 1966, 58 y.o.   MRN: [id number] BP (!) 154/81 (BP Location: Left Arm)   Pulse 84   Temp 98.3 F (36.8 C) (Oral)   Resp 19   Ht 5\' 6"  (1.676 m)   Wt 48 kg   LMP 06/06/2010   SpO2 96%   BMI 17.08 kg/m  Went to the or today for right humerus rodding.  Is doing well post op.  Still with pending culture results from craniotomy. Head CT showed a smaller mass and less edema. Will consult ID despite pending results Awake alert and following all commands Is clinically improved.  Wound is clean, dry, no signs of infecton

## 2023-10-10 NOTE — Brief Op Note (Signed)
   Brief Op Note  Date of Surgery: 10/10/2023  Preoperative Diagnosis: RIGHT HUMERAL FRACTURE  Postoperative Diagnosis: same  Procedure: Procedure(s): INTRAMEDULLARY (IM) NAIL HUMERAL RIGHT  Implants: Implant Name Type Inv. Item Serial No. Manufacturer Lot No. LRB No. Used Action  NAIL CANN IM AR TRIG 8/7X260 - [id number] Nail NAIL CANN IM AR TRIG 8/7X260  [facility] [employer] [id number] Right 1 Implanted  SCREW LOCK CANC ST TRIG 5X40 - [id number] Screw SCREW LOCK CANC ST TRIG 5X40  [facility] [employer] [id number] Right 1 Implanted  SCREW LOCK CANC ST TRI 5X36 - [id number] Screw SCREW LOCK CANC ST TRI 5X36  [facility] [employer] [id number] Right 1 Implanted  SCREW SELFTAP TI 4.0X22 - [id number] Screw SCREW SELFTAP TI 4.0X22  [employer] [id number] Right 1 Implanted    Surgeons: Surgeon(s): Huel Cote, MD  Anesthesia: Choice    Estimated Blood Loss: See anesthesia record  Complications: None  Condition to PACU: Stable  Benancio Deeds, MD 10/10/2023 5:12 PM

## 2023-10-10 NOTE — Anesthesia Procedure Notes (Signed)
Anesthesia Regional Block: Interscalene brachial plexus block   Pre-Anesthetic Checklist: , timeout performed,  Correct Patient, Correct Site, Correct Laterality,  Correct Procedure, Correct Position, site marked,  Risks and benefits discussed,  Surgical consent,  Pre-op evaluation,  At surgeon's request and post-op pain management  Laterality: Right  Prep: chloraprep       Needles:  Injection technique: Single-shot  Needle Type: Echogenic Stimulator Needle     Needle Length: 9cm  Needle Gauge: 22     Additional Needles:   Procedures:,,,, ultrasound used (permanent image in chart),,    Narrative:  Start time: 10/10/2023 3:30 PM End time: 10/10/2023 3:38 PM Injection made incrementally with aspirations every 5 mL.  Performed by: Personally  Anesthesiologist: Collene Schlichter, MD  Additional Notes: Functioning IV was confirmed and monitors were applied.  A 90mm 22ga echogenic stimulator needle was used. Sterile prep and drape, hand hygiene, and sterile gloves were used.  Negative aspiration and negative test dose prior to incremental administration of local anesthetic. The patient tolerated the procedure well.  Ultrasound guidance: relevent anatomy identified, needle position confirmed, local anesthetic spread visualized around nerve(s), vascular puncture avoided.  Image printed for medical record.

## 2023-10-10 NOTE — Anesthesia Procedure Notes (Signed)
Procedure Name: Intubation Date/Time: 10/10/2023 4:26 PM  Performed by: Thomasene Ripple, CRNAPre-anesthesia Checklist: Patient identified, Emergency Drugs available, Suction available and Patient being monitored Patient Re-evaluated:Patient Re-evaluated prior to induction Oxygen Delivery Method: Circle System Utilized Preoxygenation: Pre-oxygenation with 100% oxygen Induction Type: IV induction Ventilation: Mask ventilation without difficulty Laryngoscope Size: Miller and 3 Grade View: Grade I Tube type: Oral Tube size: 7.0 mm Number of attempts: 1 Airway Equipment and Method: Stylet and Oral airway Placement Confirmation: ETT inserted through vocal cords under direct vision, positive ETCO2 and breath sounds checked- equal and bilateral Secured at: 21 cm Tube secured with: Tape Dental Injury: Teeth and Oropharynx as per pre-operative assessment

## 2023-10-10 NOTE — Consult Note (Signed)
ORTHOPAEDIC CONSULTATION  REQUESTING PHYSICIAN: Coletta Memos, MD  Chief Complaint: Right pathological humerus fracture  HPI: Regina [name] is a 58 y.o. 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  This time she is admitted to the hospital and has undergone craniotomy for resection of possible brain metastasis versus infection.  Orthopedics consulted as she does have persistent lucency the setting of metastatic lesions of the midshaft humerus which had previously been biopsied.  This been found to be adenocarcinoma metastasis.  This time she has limited motion due to the pathological nature of the fracture.  This is her dominant hand.  Past Medical History:  Diagnosis Date   Allergy    Anxiety    Brain mass    COPD (chronic obstructive pulmonary disease) (HCC)    Emphysema of lung (HCC)    Hypertension    Past Surgical History:  Procedure Laterality Date   APPLICATION OF CRANIAL NAVIGATION Left 10/07/2023   Procedure: APPLICATION OF CRANIAL NAVIGATION;  Surgeon: Coletta Memos, MD;  Location: [facility] OR;  Service: Neurosurgery;  Laterality: Left;   CRANIOTOMY Left 10/07/2023   Procedure: FRONTAL CRANIOTOMY FOR ABSCESS;  Surgeon: Coletta Memos, MD;  Location: [facility] OR;  Service: Neurosurgery;  Laterality: Left;   dental procedure     Social History   Socioeconomic History   Marital status: Widowed    Spouse name: Not on file   Number of children: 1    Years of education: 65    Highest education level: Not on file  Occupational History   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    Son lives near by, 29 yo, married.    Social Drivers of Corporate investment banker Strain: Not on file  Food Insecurity: No Food Insecurity (09/21/2023)   Hunger Vital Sign    Worried About Running Out of Food in the Last Year: Never true    Ran Out of Food in the Last Year: Never true  Transportation Needs: No Transportation Needs (09/21/2023)   PRAPARE - Administrator, Civil Service (Medical): No    Lack of Transportation (Non-Medical): No  Physical Activity: Not on file  Stress: Not on file  Social Connections: Not on file   Family History  Problem Relation Age of Onset   Leukemia Mother    Hypertension Mother    Hypertension Father    Cancer Neg Hx    Diabetes Neg Hx    Heart disease Neg Hx    Colon cancer Neg Hx    Esophageal cancer Neg Hx    Rectal cancer Neg Hx    Stomach cancer Neg Hx    -  negative except otherwise stated in the family history section Allergies  Allergen Reactions   Pork Allergy Nausea And Vomiting   Bee Venom Hives   Prior to Admission medications   Medication Sig Start Date End Date Taking? Authorizing Provider  acetaminophen (TYLENOL) 325 MG tablet Take 2 tablets (650 mg total) by mouth every 6 (six) hours as needed for mild pain (pain score 1-3) (or Fever >/= 101). 09/20/23  Yes Azucena Fallen, MD  levETIRAcetam (KEPPRA) 500 MG tablet Take 1 tablet (500 mg total) by mouth 2 (two) times daily. 09/20/23  Yes Azucena Fallen, MD  oxyCODONE (ROXICODONE) 5 MG immediate release tablet Take 1 tablet (5 mg total) by mouth every 4 (four) hours as needed for severe pain (pain score 7-10). 09/20/23  Yes Azucena Fallen, MD   No results  found.   Positive ROS: All other systems have been reviewed and were otherwise negative with the exception of those mentioned in the HPI and as above.  Physical Exam: General: No acute distress Cardiovascular: No pedal edema Respiratory: No cyanosis, no use of accessory musculature GI: No organomegaly, abdomen is soft and non-tender Skin: No lesions in the area of chief complaint Neurologic: Sensation intact distally Psychiatric: Patient is at baseline mood and affect Lymphatic: No axillary or cervical lymphadenopathy  MUSCULOSKELETAL:   Right arm is in a sling.  Fires EPL as well as wrist extensors and flexors.  Sensation is intact in all distributions of the right hand including the radial distribution with 2+ radial pulse.  Deformity about the wrist  Independent Imaging Review: X-rays humerus 2 views: Lytic appearing lesion of the mid humerus with pathological fracture associated.  Assessment: 58 year old female with a metastatic pathological fracture of the midshaft humerus in the setting of adenocarcinoma of the lung.  Given the fact that she does have quite limited use of the right arm in the setting of a metastatic lesion I did discuss with her the role for humeral nailing.  Ultimately I do believe this would give her the best option to allow for some quality of life and mobilization of the right arm.  I did discuss specifically intramedullary nailing to protect the entirety of the bone.  Ultimately I do believe she would need radiation as well after her incision heals.  Plan for n.p.o. today for right humeral nailing  Plan: Plan for right humeral nailing today   After a lengthy discussion of treatment options, including risks, benefits, alternatives, complications of surgical and nonsurgical conservative options, the patient elected surgical repair.   The patient  is aware of the material risks  and complications including, but not limited to injury to adjacent structures,  neurovascular injury, infection, numbness, bleeding, implant failure, thermal burns, stiffness, persistent pain, failure to heal, disease transmission from allograft, need for further surgery, dislocation, anesthetic risks, blood clots, risks of death,and others. The probabilities of surgical success and failure discussed with patient given their particular co-morbidities.The time and nature of expected rehabilitation and recovery was discussed.The patient's questions were all answered preoperatively.  No barriers to understanding were noted. I explained the natural history of the disease process and Rx rationale.  I explained to the patient what I considered to be reasonable expectations given their personal situation.  The final treatment plan was arrived at through a shared patient decision making process model.   Thank you for the consult and the opportunity to see Ms. Myrtice Lauth, MD [facility] 7:51 AM

## 2023-10-10 NOTE — Progress Notes (Signed)
  Daily Progress Note   Patient Name: Regina [name]       Date: 10/10/2023 DOB: 03/14/1966  Age: 58 y.o. MRN#: [id number] Attending Physician: Coletta Memos, MD Primary Care Physician: [name], NP Admit Date: 10/01/2023 Length of Stay: 9 days  PMT to sign off at this time as goals for medical care currently determined and surgical teams managing post-op symptoms. Recommend referral to outpatient palliative medicine team at [facility] for follow-up if appropriate at time of discharge. Please re consult if acute PMT needs arise. Thank you.   Alvester Morin, DO Palliative Care Provider PMT # [phone]

## 2023-10-10 NOTE — Plan of Care (Signed)
  Problem: SLP Language Goals Goal: Patient will communicate needs/wants with Flowsheets (Taken 10/10/2023 1227) Patient will communicate ____  needs/wants with: min assist Goal: Patient will follow commands during a functional ADL with Flowsheets (Taken 10/10/2023 1227) Patient will follow ____ commands during a functional ADL with ____:  1 step  min assist

## 2023-10-10 NOTE — Evaluation (Signed)
Speech Language Pathology Evaluation Patient Details Name: Raymond Azure MRN: [id number] DOB: November 15, 1965 Today's Date: 10/10/2023 Time: 7829-5621 SLP Time Calculation (min) (ACUTE ONLY): 11 min  Problem List:  Patient Active Problem List   Diagnosis Date Noted   Cerebral intracranial abscess 10/07/2023   Palliative care encounter 10/04/2023   Pathological fracture of right humerus with delayed healing 10/04/2023   Lung cancer metastatic to brain (HCC) 10/04/2023   Vasogenic brain edema (HCC) 10/04/2023   Counseling and coordination of care 10/04/2023   Protein-calorie malnutrition, severe 10/04/2023   Metastasis to brain [facility]) 10/03/2023   Metastasis to bone (HCC) 10/03/2023   Cerebral edema (HCC) 10/01/2023   Primary adenocarcinoma of upper lobe of right lung (HCC) 09/22/2023   Pathological fracture in neoplastic disease, right humerus, initial encounter for fracture 09/19/2023   Neoplasm causing mass effect and brain compression on adjacent structures (HCC) 09/18/2023   COPD (chronic obstructive pulmonary disease) (HCC)    Anxiety    Hyponatremia 04/16/2018   Depression 04/16/2018   Hypertensive urgency    HTN (hypertension) 01/13/2016   Right anterior shoulder pain 07/18/2014   Postconcussion syndrome 05/28/2014   Past Medical History:  Past Medical History:  Diagnosis Date   Allergy    Anxiety    Brain mass    COPD (chronic obstructive pulmonary disease) (HCC)    Emphysema of lung (HCC)    Hypertension    Past Surgical History:  Past Surgical History:  Procedure Laterality Date   APPLICATION OF CRANIAL NAVIGATION Left 10/07/2023   Procedure: APPLICATION OF CRANIAL NAVIGATION;  Surgeon: Coletta Memos, MD;  Location: [facility] OR;  Service: Neurosurgery;  Laterality: Left;   CRANIOTOMY Left 10/07/2023   Procedure: FRONTAL CRANIOTOMY FOR ABSCESS;  Surgeon: Coletta Memos, MD;  Location: [facility] OR;  Service: Neurosurgery;  Laterality: Left;   dental procedure     HPI:  58yo  female admitted 10/01/23 with failure to thrive. PMH: stage 4 metastatic lung cancer with brain mets, aphasia, anxiety, HTN, COPD, hospitalized 1/11-14/25 due to right humeral shaft fx and brain mass. MRI = 3.2cm x3.4cm x2.5cm lesion left frontal lobe, 8mm mass effect and midline shift.   Interval increase in size of the enhancing metastatic lesion in  the left frontal lobe over the last 14 days. It now measures 3.2 x  3.4 x 2.5 cm.  2. Progressive mass effect and midline shift, now measuring 8 mm.  3. No acute infarct. Lesion demonstrates restricted diffusion  consistent with a high-grade tumor.  4. Right mastoid effusion. No obstructing nasopharyngeal lesion is  present.   Speech evaluation ordered. Pt passed [facility] swallow screen.Pt is s/p L carniotomy for resection of L frontal lesion on 10/07/23.   Assessment / Plan / Recommendation Clinical Impression   Pt presents with mixed expressive and receptive aphasia per limited and informal speech/language assessment. Assessment limited secondary to pt's frustration, which is understandable given medical hx and severty of expressive language deficits.  Receptive language appeared stronger than expressive language, though further assessment is needed. Pt able to verbally state her name and DOB accurately. Occasional, clear spontaneous phrases produced like "I don't know". Y/N for biographical questions was 100% accurate, though for abstract questions was <25% accurate. Pt able to repeat "Tuesday" for prompted DOW automatic sequence, though did not make any other attempts to verbalize other days. Pt appeared to become increasingly frustrated and session was discontinued.   Plan: Continue SLP services to address speech/language deficits.     SLP Assessment  SLP Recommendation/Assessment:  Patient needs continued Speech Lanaguage Pathology Services SLP Visit Diagnosis: Aphasia (R47.01) (possible apraxia of speech, though further assessment is needed)     Recommendations for follow up therapy are one component of a multi-disciplinary discharge planning process, led by the attending physician.  Recommendations may be updated based on patient status, additional functional criteria and insurance authorization.    Follow Up Recommendations  Follow physician's recommendations for discharge plan and follow up therapies    Assistance Recommended at Discharge  Frequent or constant Supervision/Assistance  Functional Status Assessment Patient has had a recent decline in their functional status and demonstrates the ability to make significant improvements in function in a reasonable and predictable amount of time.  Frequency and Duration min 1 x/week  4 weeks      SLP Evaluation Cognition  Overall Cognitive Status: Difficult to assess Orientation Level: Oriented to person;Oriented to place       Comprehension  Auditory Comprehension Overall Auditory Comprehension: Impaired Yes/No Questions: Impaired Complex Questions: 25-49% accurate Other Conversation Comments: limited assessment Interfering Components: Attention Visual Recognition/Discrimination Discrimination: Not tested Reading Comprehension Reading Status: Not tested    Expression Expression Primary Mode of Expression: Verbal Verbal Expression Overall Verbal Expression: Impaired Initiation: Impaired Automatic Speech: Day of week (impaired) Level of Generative/Spontaneous Verbalization: Phrase Repetition:  (did not attempt) Interfering Components: Attention Non-Verbal Means of Communication:  (further assessment with communication board needed)   Oral / Motor  Motor Speech Overall Motor Speech:  (assessment needed)            Ellery Plunk 10/10/2023, 12:33 PM

## 2023-10-10 NOTE — Interval H&P Note (Signed)
History and Physical Interval Note:  10/10/2023 2:45 PM  Regina [name]  has presented today for surgery, with the diagnosis of RIGHT HUMERAL FRACTURE.  The various methods of treatment have been discussed with the patient and family. After consideration of risks, benefits and other options for treatment, the patient has consented to  Procedure(s): INTRAMEDULLARY (IM) NAIL HUMERAL RIGHT (Right) as a surgical intervention.  The patient's history has been reviewed, patient examined, no change in status, stable for surgery.  I have reviewed the patient's chart and labs.  Questions were answered to the patient's satisfaction.     Huel Cote

## 2023-10-10 NOTE — Op Note (Addendum)
Date of Surgery: 10/10/2023  INDICATIONS: Ms. Mckiver is a 58 y.o.-year-old female with a right pathological humerus fracture1.  The risk and benefits of the procedure were discussed in detail and documented in the pre-operative evaluation.   PREOPERATIVE DIAGNOSIS: 1.  Pathological humerus fracture  POSTOPERATIVE DIAGNOSIS: Same.  PROCEDURE: 1.  Right humeral nailing  SURGEON: Benancio Deeds MD  ASSISTANT: Ardeen Fillers, ATC  ANESTHESIA:  general  IV FLUIDS AND URINE: See anesthesia record.  ANTIBIOTICS: Ancef  ESTIMATED BLOOD LOSS: 10 mL.  IMPLANTS:  Implant Name Type Inv. Item Serial No. Manufacturer Lot No. LRB No. Used Action  NAIL CANN IM AR TRIG [id number] - [id number] Nail NAIL CANN IM AR TRIG 8/7X260  [facility] [employer] [id number] Right 1 Implanted  SCREW LOCK CANC ST TRIG 5X40 - [id number] Screw SCREW LOCK CANC ST TRIG 5X40  [facility] [employer] [id number] Right 1 Implanted  SCREW LOCK CANC ST TRI 5X36 - [id number] Screw SCREW LOCK CANC ST TRI 5X36  [facility] [employer] [id number] Right 1 Implanted  SCREW SELFTAP TI 4.0X22 - [id number] Screw SCREW SELFTAP TI 4.0X22  [employer] [id number] Right 1 Implanted    DRAINS: None  CULTURES: None  COMPLICATIONS: none  DESCRIPTION OF PROCEDURE:   Patient was identified in the preoperative holding area.  The correct site marked according universal protocol.  Antibiotics were given 1 hour prior to skin incision.  She is subsequently taken back to the operating room.  She was transferred over to the operative [facility] table.  All bony prominences padded.  Anesthesia was induced.  At this time she was prepped and draped in the beachchair position.  Care was taken to ensure that the head was straight.  She was prepped and draped in the usual sterile fashion.  Final timeout was performed.  At this time an approach was made to the rotator cuff.  Incision was made over the lateral border of the  acromion.  The deltoid was split with electrocautery at the raphae.  Self retainer was utilized to visualize the rotator cuff.  Longitudinal incision was made in the supraspinatus approximately 1 cm.  The starting guidewire was placed right medial to the greater tuberosity.  This was malleted into place and confirmed to be center center on AP and lateral fluoroscopy.  The opening reamer was passed.  A guidewire was passed.  At this time the fracture reduction was confirmed to be anatomic on AP and lateral fluoroscopy.  The decision was made not to ream due to the malignant nature of the lesion.  The guidewire was used to measure the length of the nail and the corresponding nail of size 8 was taken.  This was passed.  2 proximal screws were placed using the proximal outrigger guide.  15 blade was used to incise just through skin.  Hemostat was used to spread through deltoid.  Distally perfect circle technique was used to place an AP screw.  15 blade was again used to incise through skin anteriorly.  Hemostat was used to spread down to bone.  This was drilled and then measured.  Corresponding screws placed.  At this point I ranged the humerus fluoroscopically and this was found to be solid.  All the wounds were irrigated.  The rotator cuff was closed with a #2 wire.  The wounds were closed in layers of 2-0 Vicryl and 3-0 nylon.  All counts were correct at the end of the case.  There were no  complications.  Dressings were applied with dressings.    POSTOPERATIVE PLAN: Be weightbearing and activity as tolerated on the right arm.  She will begin occupational therapy for overhead motion of the arm.  Benancio Deeds, MD 5:12 PM

## 2023-10-10 NOTE — Anesthesia Preprocedure Evaluation (Signed)
Anesthesia Evaluation  Patient identified by MRN, date of birth, ID band Patient awake    Reviewed: Allergy & Precautions, NPO status , Patient's Chart, lab work & pertinent test results  Airway Mallampati: II  TM Distance: >3 FB Neck ROM: Full    Dental  (+) Dental Advisory Given, Edentulous Lower, Edentulous Upper   Pulmonary COPD, former smoker Metastatic lung cancer   Pulmonary exam normal breath sounds clear to auscultation       Cardiovascular hypertension, Normal cardiovascular exam Rhythm:Regular Rate:Normal     Neuro/Psych  PSYCHIATRIC DISORDERS Anxiety Depression    Brain mass s/p left frontal craniotomy    GI/Hepatic negative GI ROS,,,(+)     substance abuse  alcohol use and marijuana use  Endo/Other  negative endocrine ROS    Renal/GU negative Renal ROS     Musculoskeletal negative musculoskeletal ROS (+)    Abdominal   Peds  Hematology negative hematology ROS (+)   Anesthesia Other Findings Day of surgery medications reviewed with the patient.  Reproductive/Obstetrics                              Anesthesia Physical Anesthesia Plan  ASA: 4  Anesthesia Plan: General   Post-op Pain Management: Tylenol PO (pre-op)*   Induction: Intravenous  PONV Risk Score and Plan: 3 and Dexamethasone, Ondansetron and Midazolam  Airway Management Planned: Oral ETT  Additional Equipment:   Intra-op Plan:   Post-operative Plan: Extubation in OR  Informed Consent: I have reviewed the patients History and Physical, chart, labs and discussed the procedure including the risks, benefits and alternatives for the proposed anesthesia with the patient or authorized representative who has indicated his/her understanding and acceptance.     Dental advisory given  Plan Discussed with: CRNA  Anesthesia Plan Comments:          Anesthesia Quick Evaluation

## 2023-10-10 NOTE — Progress Notes (Addendum)
0000- Patient made NPO at midnight for potential surgery scheduled for 4pm today.   0600-RN called for tray to be cancelled until there can be an actual order for NPO placed.

## 2023-10-11 ENCOUNTER — Other Ambulatory Visit: Payer: Self-pay | Admitting: Radiation Therapy

## 2023-10-11 ENCOUNTER — Telehealth: Payer: Self-pay | Admitting: Licensed Clinical Social Worker

## 2023-10-11 LAB — SURGICAL PATHOLOGY

## 2023-10-11 MED ORDER — ENSURE ENLIVE PO LIQD
237.0000 mL | Freq: Two times a day (BID) | ORAL | Status: DC
  Administered 2023-10-11 – 2023-10-13 (×3): 237 mL via ORAL

## 2023-10-11 MED ORDER — JUVEN PO PACK
1.0000 | PACK | Freq: Two times a day (BID) | ORAL | Status: DC
Start: 2023-10-11 — End: 2023-10-14
  Administered 2023-10-11 – 2023-10-13 (×5): 1 via ORAL
  Filled 2023-10-11 (×5): qty 1

## 2023-10-11 NOTE — Telephone Encounter (Signed)
CSW received a call from pt's niece regarding making plans for both pt and pt's brother moving forward as it appears pt may need rehab following hospitalization. CSW discussed options for securing services or placement for pt's brother at length.

## 2023-10-11 NOTE — Plan of Care (Signed)
  Problem: Education: Goal: Knowledge of General Education information will improve Description: Including pain rating scale, medication(s)/side effects and non-pharmacologic comfort measures Outcome: Progressing   Problem: Health Behavior/Discharge Planning: Goal: Ability to manage health-related needs will improve Outcome: Progressing   Problem: Clinical Measurements: Goal: Ability to maintain clinical measurements within normal limits will improve Outcome: Progressing Goal: Will remain free from infection Outcome: Progressing Goal: Diagnostic test results will improve Outcome: Progressing Goal: Respiratory complications will improve Outcome: Progressing Goal: Cardiovascular complication will be avoided Outcome: Progressing   Problem: Activity: Goal: Risk for activity intolerance will decrease Outcome: Progressing   Problem: Nutrition: Goal: Adequate nutrition will be maintained Outcome: Progressing   Problem: Coping: Goal: Level of anxiety will decrease Outcome: Progressing   Problem: Elimination: Goal: Will not experience complications related to bowel motility Outcome: Progressing Goal: Will not experience complications related to urinary retention Outcome: Progressing   Problem: Pain Managment: Goal: General experience of comfort will improve and/or be controlled Outcome: Progressing   Problem: Safety: Goal: Ability to remain free from injury will improve Outcome: Progressing   Problem: Skin Integrity: Goal: Risk for impaired skin integrity will decrease Outcome: Progressing   Problem: Education: Goal: Knowledge of the prescribed therapeutic regimen will improve Outcome: Progressing   Problem: Clinical Measurements: Goal: Usual level of consciousness will be regained or maintained. Outcome: Progressing Goal: Neurologic status will improve Outcome: Progressing Goal: Ability to maintain intracranial pressure will improve Outcome: Progressing    Problem: Skin Integrity: Goal: Demonstration of wound healing without infection will improve Outcome: Progressing   Problem: Education: Goal: Knowledge of the prescribed therapeutic regimen will improve Outcome: Progressing   Problem: Clinical Measurements: Goal: Usual level of consciousness will be regained or maintained. Outcome: Progressing Goal: Neurologic status will improve Outcome: Progressing Goal: Ability to maintain intracranial pressure will improve Outcome: Progressing   Problem: Skin Integrity: Goal: Demonstration of wound healing without infection will improve Outcome: Progressing

## 2023-10-11 NOTE — Progress Notes (Signed)
 Patient ID: Regina [name], female   DOB: Mar 15, 1966, 58 y.o.   MRN: [id number] BP (!) 147/77 (BP Location: Left Arm)   Pulse 95   Temp 98.6 F (37 C) (Oral)   Resp (!) 28   Ht 5' 6 (1.676 m)   Wt 52 kg   LMP 06/06/2010   SpO2 95%   BMI 18.50 kg/m  Alert, speech is improving Moving left upper and bilateral lower extremities Cx is not yet back, remains pending Clinically better.path positive for metastatic tumor. Although gram stain showed white cells. Region has been radiated pre op. Do not advise redo craniotomy at this time. Wound is clean, dry, no signs of infection.

## 2023-10-11 NOTE — Progress Notes (Signed)
 Physical Therapy Treatment Patient Details Name: Regina [name] MRN: [id number] DOB: 03-Oct-1965 Today's Date: 10/11/2023   History of Present Illness Pt is a 58 y/o female presenting on 1/11 with aphasia, R sided weakness, and R arm pain. Found with R humerus mid shaft pathologic fx, abnormal adenopathy in thoracic cavity. 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 1/31 frontal craniotomy for resection s/p 2/3 R humeral nailing PMH includes: anxiety, COPD, emphysema of lung, HTN.    PT Comments  Pt agreeable to OOB mobility, though upset to stop eating banana pudding brought in by family. Pt mobilizing at supervision level at this time, tolerates challenges to balance well. PT's main concern at this time is safety awareness, fall risk, and continued difficulty with expressive/receptive speech. Pt's niece at bedside, states no one is at pt's home who can help her. PT to continue to follow.      If plan is discharge home, recommend the following: Supervision due to cognitive status;A little help with bathing/dressing/bathroom;Assistance with cooking/housework;Assist for transportation;Help with stairs or ramp for entrance   Can travel by private vehicle        Equipment Recommendations  None recommended by PT    Recommendations for Other Services       Precautions / Restrictions Precautions Precautions: Fall Precaution Comments: aphasia, decreased cog Required Braces or Orthoses: Sling Restrictions Weight Bearing Restrictions Per Provider Order: No RUE Weight Bearing Per Provider Order: Weight bearing as tolerated Other Position/Activity Restrictions: activity as tolerate, allowed to complete wall climbs and AAROM with LUE     Mobility  Bed Mobility Overal bed mobility: Modified Independent             General bed mobility comments: increased time and HOB elevation    Transfers Overall  transfer level: Needs assistance   Transfers: Sit to/from Stand Sit to Stand: Supervision           General transfer comment: for safety, cues for taking her time especially with management of RUE since it is numb from nerve block    Ambulation/Gait Ambulation/Gait assistance: Supervision Gait Distance (Feet): 200 Feet Assistive device: None Gait Pattern/deviations: Step-through pattern, Decreased stride length Gait velocity: decr     General Gait Details: assist for hallway navigation, tolerates mod challenges (head turns, fast/slow gait, 90 deg turn) well   Stairs             Wheelchair Mobility     Tilt Bed    Modified Rankin (Stroke Patients Only)       Balance Overall balance assessment: Mild deficits observed, not formally tested                                          Cognition Arousal: Alert Behavior During Therapy: Flat affect, Impulsive Overall Cognitive Status: Difficult to assess                                 General Comments: impulsive with poor safety awareness        Exercises      General Comments General comments (skin integrity, edema, etc.): VSS on RA      Pertinent Vitals/Pain Pain Assessment Pain Assessment: No/denies pain Faces Pain Scale: No hurt Pain Intervention(s): Monitored during session  Home Living Family/patient expects to be discharged to:: Private residence Living Arrangements: Children Available Help at Discharge: Family;Available PRN/intermittently Type of Home: Apartment Home Access: Level entry       Home Layout: One level Home Equipment: None Additional Comments: 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reported having niece who helps with brother and her IADLs since last hospitalization; patient unable to state information so information from previous chart    Prior Function            PT Goals (current goals can now be found in the care plan section)  Acute Rehab PT Goals Patient Stated Goal: unable to state PT Goal Formulation: With patient Time For Goal Achievement: 10/23/23 Potential to Achieve Goals: Good Progress towards PT goals: Progressing toward goals    Frequency    Min 1X/week      PT Plan      Co-evaluation              AM-PAC PT 6 Clicks Mobility   Outcome Measure  Help needed turning from your back to your side while in a flat bed without using bedrails?: None Help needed moving from lying on your back to sitting on the side of a flat bed without using bedrails?: None Help needed moving to and from a bed to a chair (including a wheelchair)?: None Help needed standing up from a chair using your arms (e.g., wheelchair or bedside chair)?: None Help needed to walk in hospital room?: A Little Help needed climbing 3-5 steps with a railing? : A Little 6 Click Score: 22    End of Session Equipment Utilized During Treatment: Other (comment) (RUE sling) Activity Tolerance: Patient tolerated treatment well Patient left: in bed;with call bell/phone within reach;with bed alarm set;with family/visitor present Nurse Communication: Mobility status PT Visit Diagnosis: Difficulty in walking, not elsewhere classified (R26.2)     Time: 8382-8368 PT Time Calculation (min) (ACUTE ONLY): 14 min  Charges:    $Gait Training: 8-22 mins PT General Charges $$ ACUTE PT VISIT: 1 Visit                     Johana RAMAN, PT DPT Acute Rehabilitation Services Secure Chat Preferred  Office [phone]    Johana FORBES [place] 10/11/2023, 4:57 PM

## 2023-10-11 NOTE — Progress Notes (Signed)
 Occupational Therapy RE Evaluation Patient Details Name: Regina [name] MRN: [id number] DOB: 12/17/1965 Today's Date: 10/11/2023   History of Present Illness Pt is a 58 y/o female presenting on 1/11 with aphasia, R sided weakness, and R arm pain. Found with R humerus mid shaft pathologic fx, abnormal adenopathy in thoracic cavity. 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 1/31 frontal craniotomy for resection s/p 2/3 R humeral nailing PMH includes: anxiety, COPD, emphysema of lung, HTN.   Clinical Impression   Patient is s/p R UE humerus nailing surgery resulting in functional limitations due to the deficits listed below (see OT problem list). Pt currently with nerve block in place and educated on correct sling positioning, elevation on pillow for edema manage and AROM allowed. Pt needs cues to continue with R UE education. Next session to focus on wall climbs, AROM and AAROM with L UE.  Patient will benefit from skilled OT acutely to increase independence and safety with ADLS to allow discharge outpatient.        If plan is discharge home, recommend the following: A little help with bathing/dressing/bathroom;Assist for transportation;Assistance with cooking/housework    Functional Status Assessment  Patient has had a recent decline in their functional status and demonstrates the ability to make significant improvements in function in a reasonable and predictable amount of time.  Equipment Recommendations  None recommended by OT    Recommendations for Other Services       Precautions / Restrictions Precautions Precautions: Fall Precaution Comments: aphasia, decreased cog Required Braces or Orthoses: Sling Restrictions Weight Bearing Restrictions Per Provider Order: No RUE Weight Bearing Per Provider Order: Weight bearing as tolerated Other Position/Activity Restrictions: activity as tolerate, allowed to  complete wall climbs and AAROM with LUE      Mobility Bed Mobility Overal bed mobility: Modified Independent             General bed mobility comments: requires cues for R UE and max (A) with sling    Transfers Overall transfer level: Needs assistance   Transfers: Sit to/from Stand Sit to Stand: Supervision                  Balance                                           ADL either performed or assessed with clinical judgement   ADL Overall ADL's : Needs assistance/impaired                         Toilet Transfer: Supervision/safety;Ambulation;Regular Toilet   Toileting- Clothing Manipulation and Hygiene: Minimal assistance Toileting - Clothing Manipulation Details (indicate cue type and reason): (A) with pulling down mesh underwear. pt able to complete peri care.     Functional mobility during ADLs: Supervision/safety General ADL Comments: max (A) to adjust sling and correclty place hand in sling to prevent wrist flexion hanging out of sling     Vision         Perception         Praxis         Pertinent Vitals/Pain Pain Assessment Pain Assessment: No/denies pain Pain Location: R nerve block remains in place, pt able to move digits only     Extremity/Trunk Assessment Upper Extremity Assessment Upper  Extremity Assessment: Right hand dominant;RUE deficits/detail RUE Deficits / Details: pt demonstrates AROM digits. pt needs cues multiple time to get her to complete 10 reps of AROM flexion/ extension of digits. pt puffs in response to command. pt no activation of wrist, elbow or shoulder. pt hand over hand L UE educated to help with AAROM. pt terminates task. pt nods head yes. RUE Sensation: decreased light touch;decreased proprioception RUE Coordination: decreased fine motor;decreased gross motor   Lower Extremity Assessment Lower Extremity Assessment: Defer to PT evaluation   Cervical / Trunk Assessment Cervical /  Trunk Assessment: Normal   Communication Communication Communication: Difficulty communicating thoughts/reduced clarity of speech   Cognition Arousal: Alert Behavior During Therapy: Flat affect Overall Cognitive Status: Difficult to assess                                 General Comments: lacked awareness to sitting on R hand and hand out of sling. nerve block in place. educated to visually attend to R UE until sensation returns.     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reported having niece who helps with brother and her IADLs since last hospitalization; patient unable to state information so information from previous chart      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        OT Problem List: Decreased range of motion;Decreased activity tolerance;Decreased strength;Decreased cognition;Decreased safety awareness;Decreased knowledge of use of DME or AE;Decreased knowledge of precautions;Pain;Impaired UE functional use      OT Treatment/Interventions: Self-care/ADL training;Therapeutic exercise;DME and/or AE instruction;Therapeutic activities;Cognitive remediation/compensation;Patient/family education;Manual therapy    OT Goals(Current goals can be found in the care plan section) Acute Rehab OT Goals Patient Stated Goal: none stated OT  Goal Formulation: Patient unable to participate in goal setting Time For Goal Achievement: 10/17/23 Potential to Achieve Goals: Fair  OT Frequency: Min 1X/week    Co-evaluation              AM-PAC OT 6 Clicks Daily Activity     Outcome Measure Help from another person eating meals?: A Little Help from another person taking care of personal grooming?: A Little Help from another person toileting, which includes using toliet, bedpan, or urinal?: A Lot Help from another person bathing (including washing, rinsing, drying)?: A Lot Help from another person to put on and taking off regular upper body clothing?: A Lot Help from another person to put on and taking off regular lower body clothing?: A Lot 6 Click Score: 14   End of Session Nurse Communication: Mobility status;Precautions  Activity Tolerance: Patient tolerated treatment well Patient left: in chair;with call bell/phone within reach  OT Visit Diagnosis: Pain;Other symptoms and signs involving cognitive function;Cognitive communication deficit (R41.841) Symptoms and signs involving cognitive functions: Other cerebrovascular disease Pain -  Right/Left: Right Pain - part of body: Arm                Time: 1419-1430 OT Time Calculation (min): 11 min Charges:  OT General Charges $OT Visit: 1 Visit OT Evaluation $OT Re-eval: 1 Re-eval   Brynn, OTR/L  Acute Rehabilitation Services Office: [phone] .   Regina [name] 10/11/2023, 2:38 PM

## 2023-10-11 NOTE — Anesthesia Postprocedure Evaluation (Signed)
Anesthesia Post Note  Patient: Regina [name]  Procedure(s) Performed: INTRAMEDULLARY (IM) NAIL HUMERAL RIGHT (Right: Arm Upper)     Patient location during evaluation: PACU Anesthesia Type: General Level of consciousness: awake and alert Pain management: pain level controlled Vital Signs Assessment: post-procedure vital signs reviewed and stable Respiratory status: spontaneous breathing, nonlabored ventilation, respiratory function stable and patient connected to nasal cannula oxygen Cardiovascular status: blood pressure returned to baseline and stable Postop Assessment: no apparent nausea or vomiting Anesthetic complications: no   No notable events documented.  Last Vitals:    Last Pain:                 Collene Schlichter

## 2023-10-11 NOTE — Progress Notes (Signed)
   Subjective:  Patient reports pain as mild.  Nerve block is in place.  Sling is in place.  Denies any pain around the surgical incision  Objective:   VITALS:   Vitals:   10/11/23 0317 10/11/23 0400 10/11/23 0833 10/11/23 1157  BP: 134/80  129/67 (!) 158/81  Pulse: 68 74 82 80  Resp: 18 19 (!) 24 (!) 21  Temp: 97.6 F (36.4 C)  99.1 F (37.3 C) 98.8 F (37.1 C)  TempSrc: Oral  Oral Oral  SpO2: 100% 98% 99% 100%  Weight:      Height:       Block is an effect.  Completely numb in the right hand.  Only able to weakly fire flexors of the right hand.  2+ radial pulse  Lab Results  Component Value Date   WBC 21.8 (H) 10/09/2023   HGB 15.3 (H) 10/09/2023   HCT 44.8 10/09/2023   MCV 87.2 10/09/2023   PLT 436 (H) 10/09/2023     Assessment/Plan:  1 Day Post-Op status post right humeral nailing.  Block is still in effect at this time  - Expected postop acute blood loss anemia - will monitor for symptoms - Patient to work with OT to optimize mobilization safely - DVT ppx -patient -She may use her right arm back as tolerated for wall climbs and active and active assisted range of motion as tolerated.  She may perform any ADLs.  She may begin strengthening as she can tolerate    Regina [name] 10/11/2023, 12:27 PM

## 2023-10-11 NOTE — Progress Notes (Signed)
 Nutrition Follow-up  DOCUMENTATION CODES:   Severe malnutrition in context of chronic illness  INTERVENTION:   - Encourage PO intake  - Room service with assist  - Ensure Plus High Protein po BID, each supplement provides 350 kcal and 20 grams of protein. - 1 packet Juven BID, each packet provides 95 calories, 2.5 grams of protein (collagen) - MVI with minerals daily  - Recommend updated weight-Messaged RN  NUTRITION DIAGNOSIS:   Severe Malnutrition related to chronic illness as evidenced by severe fat depletion, severe muscle depletion.  - Still applicable  GOAL:   Patient will meet greater than or equal to 90% of their needs  - Ongoing   MONITOR:   PO intake, Supplement acceptance, Weight trends  REASON FOR ASSESSMENT:   Consult Assessment of nutrition requirement/status  ASSESSMENT:   58 y.o. 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  2/3 - Right humerus rodding   Pt sitting up in bed, agitated on visit. Only said she had breakfast and was eating good, did not endorse how much. Wanted someone to wash her up. Has been refusing most Ensures and is drinking maybe 1/day, is eating around 50-100% of her meals. Last night had 100% of her dinner consisting of chicken, rice, dinner roll, fruit, and green beans. Of note did not have breakfast or Lunch ordered yesterday. No new weight since 1/25, messaged RN for updated weight.    New stage 2 pressure injury noted on coccyx, encouraged intake of 3 or more meals a day in addition to Ensure to support increased nutrient needs with cancer/treatment.   Admit weight: 48 kg Current weight: 48 kg   Average Meal Intake: 1/29-2/3: 83% intake x 8 recorded meals  Nutritionally Relevant Medications: Scheduled Meds:  feeding supplement  237  mL Oral BID BM   multivitamin with minerals  1 tablet Oral Daily   nutrition supplement (JUVEN)  1 packet Oral BID BM   pantoprazole   40 mg Oral Daily   senna  1 tablet Oral BID   Continuous Infusions:  clindamycin  (CLEOCIN ) IV 600 mg (10/11/23 9385)   Labs Reviewed: 2/2: Sodium 130, Chloride 96,  BG ranges from 131 mg/dL over the last 24 hours  Diet Order:   Diet Order             Diet regular Room service appropriate? Yes with Assist; Fluid consistency: Thin  Diet effective now                   EDUCATION NEEDS:   Education needs have been addressed  Skin:  Skin Assessment: Skin Integrity Issues: Skin Integrity Issues:: Stage II, Incisions Stage II: Coyccx Incisions: Head, R arm  Last BM:  10/11/23 type 5  Height:   Ht Readings from Last 1 Encounters:  10/01/23 5' 6 (1.676 m)    Weight:   Wt Readings from Last 1 Encounters:  10/01/23 48 kg    Ideal Body Weight:  59.1 kg  BMI:  Body mass index is 17.08 kg/m.  Estimated Nutritional Needs:   Kcal:  1700-1900 kcals  Protein:  75-90 grams  Fluid:  >/= 1.7L   Olivia Kenning, RD Registered Dietitian  See Amion for more information

## 2023-10-12 LAB — AEROBIC/ANAEROBIC CULTURE W GRAM STAIN (SURGICAL/DEEP WOUND)

## 2023-10-12 NOTE — TOC Progression Note (Signed)
 Transition of Care [facility]) - Progression Note    Patient Details  Name: Regina [name] MRN: [id number] Date of Birth: 1965-10-13  Transition of Care [facility]) CM/SW Contact  Montie LOISE Louder, [place] Phone Number: 10/12/2023, 5:12 PM  Clinical Narrative:     CSW met with patient at bedside. CSW introduced self and explained role. CSW discussed with patient recommendations for short term rehab. Patient declined SNF and was determine to discharge home. Patient states her niece will help her. She states her niece, [name] works but when she is not there, her son will be there to help her. She was agreeable to [facility].  CSW spoke with the patient's niece. She states she will do what she can to assist her and the patient's brother(who is in the home-patient assist with care).  She states the patient's son, works but has been able to assist as  needed. She will provide transportation once stable for discharge.  CSW encourage family to explore resources in the community such as Medicaid, Transportation, PACE, CAPPS, to determine if eligible for assistance in the home.  TOC will continue to follow and assist with discharge planning.   Montie Louder, MSW, LCSW Clinical Social Worker    Expected Discharge Plan: Home/Self Care Barriers to Discharge: Continued Medical Work up  Expected Discharge Plan and Services                                               Social Determinants of Health (SDOH) Interventions SDOH Screenings   Food Insecurity: No Food Insecurity (09/21/2023)  Housing: Low Risk  (09/21/2023)  Transportation Needs: No Transportation Needs (09/21/2023)  Utilities: Not At Risk (09/21/2023)  Depression (PHQ2-9): Low Risk  (06/08/2023)  Tobacco Use: Medium Risk (10/10/2023)    Readmission Risk Interventions    10/05/2023    9:51 AM 09/20/2023    2:40 PM  Readmission Risk Prevention Plan  Medication Screening  Complete  Transportation Screening Complete Complete  HRI or Home  Care Consult Complete   Social Work Consult for Recovery Care Planning/Counseling Complete   Palliative Care Screening Complete   Medication Review Oceanographer) Complete

## 2023-10-12 NOTE — Progress Notes (Signed)
 Patient ID: Regina [name], female   DOB: [birth year], 58 y.o.   MRN: [id number] BP (!) 138/92 (BP Location: Left Arm)   Pulse 85   Temp 98.4 F (36.9 C) (Oral)   Resp 20   Ht 5' 6 (1.676 m)   Wt 52 kg   LMP 06/06/2010   SpO2 98%   BMI 18.50 kg/m  Still working on discharge plans.  Alert, following commands Improving expressive aphasia.  Moving left side well. Wound is clean, dry, no signs of infection.

## 2023-10-12 NOTE — Progress Notes (Signed)
  Radiation Oncology         [phone] ________________________________  Name: Regina [name] MRN: [id number]  Date: 10/01/2023  DOB: [birth year]  Chart Note:  I reviewed this patient's most recent findings and wanted to take a minute to document my impression.  This patient is a very nice 58 yo woman s/p pre-op SRS and resection of a solitary 3.2 cm left frontal brain metastasis from right upper lung cancer.  She tolerated craniotomy well and her aphasia is improving.  She has also undergone stabilization of her right humerus.  Post-op, it is hoped that her performance status will improve enough to regain independence.  In terms of her lung cancer, her tumor is 90% positive for PD-L1.  She has an excellent likelihood of responding to immunotherapy which is well tolerated and can be very effective.  Hopefully, we can get her home to complete her staging and start treatment soon.   ________________________________  [name], M.D.

## 2023-10-12 NOTE — Progress Notes (Signed)
 Occupational Therapy Treatment Patient Details Name: Regina [name] MRN: [id number] DOB: Dec 02, 1965 Today's Date: 10/12/2023   History of present illness Pt is a 58 y/o female presenting on 1/11 with aphasia, R sided weakness, and R arm pain. Found with R humerus mid shaft pathologic fx, abnormal adenopathy in thoracic cavity. 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 1/31 frontal craniotomy for resection s/p 2/3 R humeral nailing PMH includes: anxiety, COPD, emphysema of lung, HTN.   OT comments  Pt progressing toward established OT goals. Provided handouts for sling application, AROM elbow/wrist/hand, and AAROM shoulder as well as wall walks with education provided. Pt remains limited in AROM of R hand/wrist/elbow/shoulder so unable to perform wall walks/climbs this session. Pt following one step commands during exercise and with good participation but max cues to keep up with number of repetitions of each exercise and up to mod cues to optimize technique. Re-donned sling mod A. Able to perform toileting and short distance ambulation at supervision level. Current plan remains appropriate pending family ability to provide transportation and assistance with ADL/IADL. Otherwise may need inpatient rehab.       If plan is discharge home, recommend the following:  A little help with bathing/dressing/bathroom;Assist for transportation;Assistance with cooking/housework   Equipment Recommendations  None recommended by OT    Recommendations for Other Services      Precautions / Restrictions Precautions Precautions: Fall Precaution Comments: aphasia, decreased cog Required Braces or Orthoses: Sling Restrictions Weight Bearing Restrictions Per Provider Order: No RUE Weight Bearing Per Provider Order: Weight bearing as tolerated Other Position/Activity Restrictions: activity as tolerate, allowed to complete wall climbs  and AAROM with LUE       Mobility Bed Mobility Overal bed mobility: Modified Independent                  Transfers Overall transfer level: Needs assistance Equipment used: None Transfers: Sit to/from Stand Sit to Stand: Supervision                 Balance Overall balance assessment: Mild deficits observed, not formally tested                                         ADL either performed or assessed with clinical judgement   ADL Overall ADL's : Needs assistance/impaired     Grooming: Supervision/safety;Standing Grooming Details (indicate cue type and reason): Rinsing L hand after toileting             Lower Body Dressing: Supervision/safety Lower Body Dressing Details (indicate cue type and reason): able to figure 4 cross Toilet Transfer: Supervision/safety;Ambulation;Regular Toilet   Toileting- Architect and Hygiene: Supervision/safety;Sit to/from stand Toileting - Clothing Manipulation Details (indicate cue type and reason): for pericare only; no underpants donned today     Functional mobility during ADLs: Supervision/safety General ADL Comments: mod A for sling application but pt with fair understanding of sling placmeent and how to don    Extremity/Trunk Assessment Upper Extremity Assessment Upper Extremity Assessment: Right hand dominant RUE Deficits / Details: pt demonstrates AROM digits/wrist only and greater active motion with flexion than extension 1/5 wrist extension. completes multiple reps but constant cues to get pt to count her reps to perform correct amount 10 reps of AROM flexion/ extension of digits., AAROM flexion/ext wrist, pronation/supination forearm, and  elbow flexion.    pt hand over hand L UE educated to help with AAROM. Performed AAROM gentle shoulder flexion to 30 degrees. shoulder ed handout provided and pt readung at end of session RUE Sensation: decreased light touch;decreased proprioception RUE  Coordination: decreased fine motor;decreased gross motor   Lower Extremity Assessment Lower Extremity Assessment: Defer to PT evaluation        Vision       Perception     Praxis      Cognition Arousal: Alert Behavior During Therapy: Flat affect, Impulsive Overall Cognitive Status: Difficult to assess                                 General Comments: impulsive with poor safety awareness but responds well to cues pause, etc. Following one step commands WFL; increased time and cues to optimize technique during new learning for shoulder education; handouts provided        Exercises      Shoulder Instructions       General Comments VSS on RA    Pertinent Vitals/ Pain       Pain Assessment Pain Assessment: Faces Faces Pain Scale: Hurts even more Pain Location: headache, R arm Pain Descriptors / Indicators: Discomfort, Grimacing Pain Intervention(s): Limited activity within patient's tolerance, Monitored during session  Home Living                                          Prior Functioning/Environment              Frequency  Min 1X/week        Progress Toward Goals  OT Goals(current goals can now be found in the care plan section)  Progress towards OT goals: Progressing toward goals  Acute Rehab OT Goals Patient Stated Goal: get better OT Goal Formulation: Patient unable to participate in goal setting Time For Goal Achievement: 10/17/23 Potential to Achieve Goals: Fair ADL Goals Pt Will Perform Grooming: with modified independence;standing Pt Will Perform Upper Body Bathing: with supervision;sitting Pt Will Perform Upper Body Dressing: with modified independence;sitting Pt Will Perform Lower Body Dressing: with modified independence;sit to/from stand Pt Will Transfer to Toilet: with modified independence;ambulating Pt Will Perform Toileting - Clothing Manipulation and hygiene: with modified independence;sit to/from  stand Additional ADL Goal #1: patient to complete ADLs while maintaining shoulder precautions with supevision to return to PLOF  Plan      Co-evaluation                 AM-PAC OT 6 Clicks Daily Activity     Outcome Measure   Help from another person eating meals?: A Little Help from another person taking care of personal grooming?: A Little Help from another person toileting, which includes using toliet, bedpan, or urinal?: A Little Help from another person bathing (including washing, rinsing, drying)?: A Little Help from another person to put on and taking off regular upper body clothing?: A Lot Help from another person to put on and taking off regular lower body clothing?: A Lot 6 Click Score: 16    End of Session Equipment Utilized During Treatment: Other (comment) (sling)  OT Visit Diagnosis: Pain;Other symptoms and signs involving cognitive function;Cognitive communication deficit (R41.841) Symptoms and signs involving cognitive functions: Other cerebrovascular disease Pain - Right/Left: Right Pain - part  of body: Arm   Activity Tolerance Patient tolerated treatment well   Patient Left in chair;with call bell/phone within reach   Nurse Communication Mobility status;Precautions        Time: 8454-8393 OT Time Calculation (min): 21 min  Charges: OT General Charges $OT Visit: 1 Visit OT Treatments $Therapeutic Exercise: 8-22 mins  Regina [name], OTR/L [facility] Acute Rehabilitation Office: [phone]   Regina JONETTA Lebron 10/12/2023, 5:37 PM

## 2023-10-12 NOTE — Progress Notes (Signed)
   Subjective:  Patient reports pain as mild.  Nerve block is in place.  Sling is in place.  Denies any pain around the surgical incision  Objective:   VITALS:   Vitals:   10/11/23 1639 10/11/23 1912 10/11/23 2308 10/12/23 0308  BP:  (!) 147/77 126/76 130/77  Pulse: 99 95 86 88  Resp: 20 (!) 28 (!) 26 (!) 28  Temp:  98.6 F (37 C) 98.4 F (36.9 C) 98.2 F (36.8 C)  TempSrc:  Oral Oral Oral  SpO2: 100% 95% 96% 97%  Weight:      Height:       Block is an effect.  Completely numb in the right hand.  Only able to weakly fire flexors of the right hand.  2+ radial pulse  Lab Results  Component Value Date   WBC 21.8 (H) 10/09/2023   HGB 15.3 (H) 10/09/2023   HCT 44.8 10/09/2023   MCV 87.2 10/09/2023   PLT 436 (H) 10/09/2023     Assessment/Plan:  2 Days Post-Op status post right humeral nailing.  Block is still in effect at this time  - Expected postop acute blood loss anemia - will monitor for symptoms - Patient to work with OT to optimize mobilization safely - DVT ppx -patient -She may use her right arm back as tolerated for wall climbs and active and active assisted range of motion as tolerated.  She may perform any ADLs.  She may begin strengthening as she can tolerate -Okay for radiation to right arm at this timepoint    [facility] 10/12/2023, 7:56 AM

## 2023-10-13 ENCOUNTER — Telehealth: Payer: Self-pay | Admitting: Surgery

## 2023-10-13 ENCOUNTER — Other Ambulatory Visit: Payer: Self-pay

## 2023-10-13 DIAGNOSIS — S42391D Other fracture of shaft of right humerus, subsequent encounter for fracture with routine healing: Secondary | ICD-10-CM

## 2023-10-13 DIAGNOSIS — G936 Cerebral edema: Secondary | ICD-10-CM

## 2023-10-13 DIAGNOSIS — M25511 Pain in right shoulder: Secondary | ICD-10-CM

## 2023-10-13 DIAGNOSIS — M80821G Other osteoporosis with current pathological fracture, right humerus, subsequent encounter for fracture with delayed healing: Secondary | ICD-10-CM

## 2023-10-13 MED ORDER — DEXAMETHASONE 2 MG PO TABS
ORAL_TABLET | ORAL | 0 refills | Status: AC
  Filled 2023-10-13 – 2023-10-14 (×2): qty 30, 11d supply, fill #0

## 2023-10-13 MED ORDER — DEXAMETHASONE SODIUM PHOSPHATE 4 MG/ML IJ SOLN
INTRAMUSCULAR | Status: AC
  Filled 2023-10-13: qty 1

## 2023-10-13 MED ORDER — JUVEN PO PACK: 1.0000 | PACK | Freq: Two times a day (BID) | ORAL | 0 refills | Status: DC

## 2023-10-13 NOTE — Telephone Encounter (Signed)
 ED RN CM received call from floor Nurse concerning late discharge. Patient is inquiring about HHPT.  Reviewed chart patient noted commercial [employer], sent inquiry to several of our preferred [facility] companies.  Received information concerning co-pay. Updated patient and patient would prefer Out patient  Rehab. Referral sent to [facility], information placed on AVS and verbally given to patient

## 2023-10-13 NOTE — Progress Notes (Signed)
   Subjective:  Patient reports pain as mild.  Nerve block is in place.  Sling is in place.  Denies any pain around the surgical incision  Objective:   VITALS:   Vitals:   10/12/23 1832 10/12/23 1934 10/12/23 2248 10/13/23 0255  BP: (!) 144/82 (!) 138/92 129/70 123/82  Pulse:  85 78 75  Resp:  20 15 14   Temp:  98.4 F (36.9 C) 98 F (36.7 C) 97.8 F (36.6 C)  TempSrc:  Oral Oral Oral  SpO2:  98% 98% 99%  Weight:      Height:       Block is an effect.  Weakly fires wrist extensors, no EPL yet.  2+ radial pulse  Lab Results  Component Value Date   WBC 21.8 (H) 10/09/2023   HGB 15.3 (H) 10/09/2023   HCT 44.8 10/09/2023   MCV 87.2 10/09/2023   PLT 436 (H) 10/09/2023     Assessment/Plan:  3 Days Post-Op status post right humeral nailing.  Block wearing off  - Expected postop acute blood loss anemia - will monitor for symptoms - Patient to work with OT to optimize mobilization safely - DVT ppx -patient -She may use her right arm back as tolerated for wall climbs and active and active assisted range of motion as tolerated.  She may perform any ADLs.  She may begin strengthening as she can tolerate -Okay for radiation to right arm at this timepoint    [facility] 10/13/2023, 7:18 AM

## 2023-10-13 NOTE — Plan of Care (Signed)
  Problem: Education: Goal: Knowledge of General Education information will improve Description: Including pain rating scale, medication(s)/side effects and non-pharmacologic comfort measures Outcome: Progressing   Problem: Health Behavior/Discharge Planning: Goal: Ability to manage health-related needs will improve Outcome: Progressing   Problem: Clinical Measurements: Goal: Ability to maintain clinical measurements within normal limits will improve Outcome: Progressing Goal: Will remain free from infection Outcome: Progressing Goal: Diagnostic test results will improve Outcome: Progressing Goal: Respiratory complications will improve Outcome: Progressing Goal: Cardiovascular complication will be avoided Outcome: Progressing   Problem: Activity: Goal: Risk for activity intolerance will decrease Outcome: Progressing   Problem: Nutrition: Goal: Adequate nutrition will be maintained Outcome: Progressing   Problem: Coping: Goal: Level of anxiety will decrease Outcome: Progressing   Problem: Elimination: Goal: Will not experience complications related to bowel motility Outcome: Progressing Goal: Will not experience complications related to urinary retention Outcome: Progressing   Problem: Pain Managment: Goal: General experience of comfort will improve and/or be controlled Outcome: Progressing   Problem: Safety: Goal: Ability to remain free from injury will improve Outcome: Progressing   Problem: Skin Integrity: Goal: Risk for impaired skin integrity will decrease Outcome: Progressing   Problem: Education: Goal: Knowledge of the prescribed therapeutic regimen will improve Outcome: Progressing   Problem: Clinical Measurements: Goal: Usual level of consciousness will be regained or maintained. Outcome: Progressing Goal: Neurologic status will improve Outcome: Progressing Goal: Ability to maintain intracranial pressure will improve Outcome: Progressing    Problem: Skin Integrity: Goal: Demonstration of wound healing without infection will improve Outcome: Progressing   Problem: Education: Goal: Knowledge of the prescribed therapeutic regimen will improve Outcome: Progressing   Problem: Clinical Measurements: Goal: Usual level of consciousness will be regained or maintained. Outcome: Progressing Goal: Neurologic status will improve Outcome: Progressing Goal: Ability to maintain intracranial pressure will improve Outcome: Progressing   Problem: Skin Integrity: Goal: Demonstration of wound healing without infection will improve Outcome: Progressing

## 2023-10-13 NOTE — Discharge Instructions (Signed)
 Craniotomy Care After Please read the instructions outlined below and refer to this sheet in the next few weeks. These discharge instructions provide you with general information on caring for yourself after you leave the hospital. Your surgeon may also give you specific instructions. While your treatment has been planned according to the most current medical practices available, unavoidable complications occasionally occur. If you have any problems or questions after discharge, please call your surgeon. Although there are many types of brain surgery, recovery following craniotomy (surgical opening of the skull) is much the same for each. However, recovery depends on many factors. These include the type and severity of brain injury and the type of surgery. It also depends on any nervous system function problems (neurological deficits) before surgery. If the craniotomy was done for cancer, chemotherapy and radiation could follow. You could be in the hospital from 5 days to a couple weeks. This depends on the type of surgery, findings, and whether there are complications. HOME CARE INSTRUCTIONS   It is not unusual to hear a clicking noise after a craniotomy, the plates and screws used to attach the bone flap can sometimes cause this. It is a normal occurrence if this does happen  Do not drive for 10 days after the operation  Your scalp may feel spongy for a while, because of fluid under it. This will gradually get better. Occasionally, the surgeon will not replace the bone that was removed to access the brain. If there is a bony defect, the surgeon will ask you to wear a helmet for protection. This is a discussion you should have with your surgeon prior to leaving the hospital (discharge).  Numbness may persist in some areas of your scalp.  Take all medications as directed. Sometimes steroids to control swelling are prescribed. Anticonvulsants to prevent seizures may also be given. Do not use alcohol,  other drugs, or medications unless your surgeon says it is OK.  Keep the wound dry and clean. The wound may be washed gently with soap and water. Then, you may gently blot or dab it dry, without rubbing. Do not take baths, use swimming pools or hot tubs for 10 days, or as instructed by your caregiver. It is best to wait to see you surgeon at your first postoperative visit, and to get directions at that time.  Only take over-the-counter or prescription medicines for pain, discomfort, or fever as directed by your caregiver.  You may continue your normal diet, as directed.  Walking is OK for exercise. Wait at least 3 months before you return to mild, non-contact sports or as your surgeon suggests. Contact sports should be avoided for at least 1 year, unless your surgeon says it is OK.  If you are prescribed steroids, take them exactly as prescribed. If you start having a decrease in nervous system functions (neurological deficits) and headaches as the dose of steroids is reduced, tell your surgeon right away.  When the anticonvulsant prescription is finished you no longer need to take it. SEEK IMMEDIATE MEDICAL CARE IF:   You develop nausea, vomiting, severe headaches, confusion, or you have a seizure.  You develop chest pain, a stiff neck, or difficulty breathing.  There is redness, swelling, or increasing pain in the wound or pin insertion sites.  You have an increase in swelling or bruising around the eyes.  There is drainage or pus coming from the wound.  You have an oral temperature above 102 F (38.9 C), not controlled by medicine.  You notice a foul smell coming from the wound or dressing.  The wound breaks open (edges not staying together) after the stitches have been removed.  You develop dizziness or fainting while standing.  You develop a rash.  You develop any reaction or side effects to the medications given. Document Released: 11/23/2005 Document Revised: 11/15/2011  Document Reviewed: 09/01/2009 [facility] Patient Information 2013 [place], [place].

## 2023-10-13 NOTE — Discharge Summary (Signed)
 Physician Discharge Summary  Patient ID: Regina [name] MRN: [id number] DOB/AGE: [birth year] 58 y.o.  Admit date: 10/01/2023 Discharge date: 10/13/2023  Admission Diagnoses:brain tumor  Discharge Diagnoses: metastatic brain tumor, metastatic right humerus pathological fracture Principal Problem:   Primary adenocarcinoma of upper lobe of right lung (HCC) Active Problems:   HTN (hypertension)   Neoplasm causing mass effect and brain compression on adjacent structures (HCC)   Cerebral edema (HCC)   Metastasis to brain (HCC)   Metastasis to bone [facility])   Palliative care encounter   Pathological fracture of right humerus with delayed healing   Lung cancer metastatic to brain (HCC)   Vasogenic brain edema (HCC)   Counseling and coordination of care   Protein-calorie malnutrition, severe   Cerebral intracranial abscess   Closed fracture of shaft of right humerus   Pressure injury of skin   Discharged Condition: good  Hospital Course: Regina [name] is a 58 y.o. female Was readmitted after initial presentation on 1/11. She had missed a few appointments for her treatment including missed appointments to oncology. After admission she underwent a right humerus biopsy at the site of a pathological fracture. 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 She had preoperative radiation to the left frontal mass which had increased in size from the original admission to the readmission. She was taken for a stereotactic craniotomy where I encountered what appeared to be purulent material. White cells were identified, but no organism grew. The wall of the mass was consistent with adenocarcinoma, and the humeral biopsy. She has improved clinically, repeat CT scan showed the mass still present but smaller with less edema. Treated with clindamycin  for a short course. She is discharged home with much improved speech, and expressive aphasia. She  is moving left side well,  mild weakness on right side. She is ambulating with assist. Regina [name] is voiding and tolerating a regular diet at discharge.  Treatments: surgery: craniotomy for left frontal tumor Stereotactic preoperative radiation to left frontal mass   Right humeral nailing   Discharge Exam: Blood pressure (!) 148/81, pulse 94, temperature 98 F (36.7 C), temperature source Oral, resp. rate (!) 22, height 5' 6 (1.676 m), weight 52 kg, last menstrual period 06/06/2010, SpO2 98%. General appearance: alert, cooperative, appears stated age, and no distress  Disposition: Discharge disposition: 01-Home or Self Care      RIGHT HUMERAL FRACTURE  Allergies as of 10/13/2023       Reactions   Pork Allergy Nausea And Vomiting   Bee Venom Hives        Medication List     TAKE these medications    acetaminophen  325 MG tablet Commonly known as: TYLENOL  Take 2 tablets (650 mg total) by mouth every 6 (six) hours as needed for mild pain (pain score 1-3) (or Fever >/= 101).   dexamethasone  2 MG tablet Commonly known as: DECADRON  Take 2 tablets (4 mg total) by mouth 2 (two) times daily with a meal for 4 days, THEN 1 tablet (2 mg total) 2 (two) times daily with a meal for 7 days. Start taking on: October 13, 2023 What changed: See the new instructions.   levETIRAcetam  500 MG tablet Commonly known as: Keppra  Take 1 tablet (500 mg total) by mouth 2 (two) times daily.   nutrition supplement (JUVEN) Pack Take 1 packet by mouth 2 (two) times daily between meals. Start taking on: October 14, 2023   oxyCODONE  5 MG  immediate release tablet Commonly known as: Roxicodone  Take 1 tablet (5 mg total) by mouth every 4 (four) hours as needed for severe pain (pain score 7-10).        Follow-up Information     [facility] Follow up.   Why: referral made for outpt palliative care needs- they will contact you post discharge to follow up Contact information: [address]  [facility] [place]  [address] [phone]        Genelle Standing, MD Follow up.   Specialty: Orthopedic Surgery Contact information: [address] [place] [address] [phone]         Gillie Duncans, MD Follow up in 1 week(s).   Specialty: Neurosurgery Why: for staple removal Contact information: [address] [place] [address] [phone]                 Signed: Duncans Gillie 10/13/2023, 5:04 PM

## 2023-10-13 NOTE — Progress Notes (Signed)
 Pt has order to discharge home, spoke with pt and she states her niece will be the person that will come and take her home. Pt okay with this nurse calling her niece to let her know she is going home and needs a ride.  Spoke with pt's niece, [name], she will come and take pt home, she ask that staff wait to go over her discharge instructions when she arrives so that she knows more about what she needs. Did make [name] aware of script for decadron /dexamethasone  tablets would need to be picked up this evening or first thing in the morning as it would need to start in the morning (pt had IV doses today at the hospital).  [name] states she is in [facility] and would be here in the next hour or so to pick pt up and would come to her room.  Pt's primary nurse today, Arley made aware of the above and will review instructions with pt and niece.   Pt not appropriate to go down to the discharge lounge to wait for niece as per PT notes pt is impulsive, lacks some insight into safety and needs safety cues. Pt was admitted due to a fall, high fall risk. Pt will stay in her room while she waits for her niece to arrive.   Message left on CM, Kristy's voice mail if pt needed further [facility] needs arranged other than the [facility] that is indicated on her AVS/discharge instructions.   Shalicia Craghead,RN SWOT

## 2023-10-13 NOTE — Op Note (Signed)
 1. Metastasis to brain [facility])  C79.31           3D TREATMENT PLANNING AND DOSIMETRY:  The patient's radiation plan was reviewed and approved by neurosurgery and radiation oncology prior to treatment.  It showed 3-dimensional radiation distributions overlaid onto the planning CT/MRI image set.  The DVH's for the target structures as well as the organs at risk were reviewed. The documentation of the 3D plan and dosimetry are filed in the radiation oncology EMR.   NARRATIVE:  Regina [name] was brought to the TrueBeam stereotactic radiation treatment machine and placed supine on the CT couch. The head frame was applied, and the patient was set up for stereotactic radiosurgery.  Radiation Oncology was present for the set-up and delivery   SIMULATION VERIFICATION:  In the couch zero-angle position, the patient underwent Exactrac imaging using the Brainlab system with orthogonal KV images.  These were carefully aligned and repeated to confirm treatment position for each of the isocenters.  The Exactrac snap film verification was repeated at each couch angle.   PROCEDURE: Regina [name] received stereotactic radiosurgery to the following targets: Left Frontal 3.4 cm target was treated using 5 Rapid Arc VMAT Beams to a prescription dose of 8 Gy to be given 3 times for a total of 24 Gy.  ExacTrac registration was performed for each couch angle.  The 100% isodose line was prescribed.  6 MV X-rays were delivered in the flattening filter free beam mode.

## 2023-10-13 NOTE — Progress Notes (Signed)
 Physical Therapy Treatment Patient Details Name: Regina [name] MRN: [id number] DOB: [birth year] Today's Date: 10/13/2023   History of Present Illness Pt is a 58 y/o female presenting on 1/11 with aphasia, R sided weakness, and R arm pain. Found with R humerus mid shaft pathologic fx, abnormal adenopathy in thoracic cavity. 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 1/31 frontal craniotomy for resection s/p 2/3 R humeral nailing PMH includes: anxiety, COPD, emphysema of lung, HTN.    PT Comments  Pt very eager to d/c home, declined recommendation for post-acute rehab. Pt is mobilizing well, ambulatory for 400 ft in hallway without AD, but lacks some insight into safety especially with RUE in sling. Pt remains impulsive, and benefits from safety cues to slow down and navigate space carefully. Pt lives at home with her brother who she mostly cares for, states she will have assist from her niece and son as needed at d/c.     If plan is discharge home, recommend the following: Supervision due to cognitive status;Assistance with cooking/housework;Assist for transportation;Help with stairs or ramp for entrance;A little help with bathing/dressing/bathroom   Can travel by private vehicle        Equipment Recommendations  None recommended by PT    Recommendations for Other Services       Precautions / Restrictions Precautions Precautions: Fall Precaution Comments: aphasia, decreased cog Required Braces or Orthoses: Sling Restrictions Weight Bearing Restrictions Per Provider Order: No RUE Weight Bearing Per Provider Order: Weight bearing as tolerated Other Position/Activity Restrictions: activity as tolerate, allowed to complete wall climbs and AAROM with LUE     Mobility  Bed Mobility Overal bed mobility: Modified Independent                  Transfers Overall transfer level: Modified independent                       Ambulation/Gait Ambulation/Gait assistance: Supervision Gait Distance (Feet): 400 Feet Assistive device: None Gait Pattern/deviations: Step-through pattern, WFL(Within Functional Limits) Gait velocity: WFL     General Gait Details: improving gait speed and tolerance, cues for safe hallway navigation   Stairs             Wheelchair Mobility     Tilt Bed    Modified Rankin (Stroke Patients Only)       Balance Overall balance assessment: No apparent balance deficits (not formally assessed)                                          Cognition Arousal: Alert Behavior During Therapy: Flat affect, Impulsive Overall Cognitive Status: Difficult to assess                                 General Comments: impulsive with poor safety awareness. Irritable with PT, especially if PT asks pt to repeat self        Exercises      General Comments        Pertinent Vitals/Pain Pain Assessment Pain Assessment: Faces Faces Pain Scale: Hurts little more Pain Location: R hand and wrist Pain Descriptors / Indicators: Discomfort, Grimacing Pain Intervention(s): Limited activity within patient's tolerance, Monitored during session, Repositioned    Home Living  Prior Function            PT Goals (current goals can now be found in the care plan section) Acute Rehab PT Goals Patient Stated Goal: unable to state PT Goal Formulation: With patient Time For Goal Achievement: 10/23/23 Potential to Achieve Goals: Good Progress towards PT goals: Progressing toward goals    Frequency    Min 1X/week      PT Plan      Co-evaluation              AM-PAC PT 6 Clicks Mobility   Outcome Measure  Help needed turning from your back to your side while in a flat bed without using bedrails?: None Help needed moving from lying on your back to sitting on the side of a flat bed  without using bedrails?: None Help needed moving to and from a bed to a chair (including a wheelchair)?: None Help needed standing up from a chair using your arms (e.g., wheelchair or bedside chair)?: None Help needed to walk in hospital room?: A Little Help needed climbing 3-5 steps with a railing? : A Little 6 Click Score: 22    End of Session Equipment Utilized During Treatment: Other (comment) (RUE sling) Activity Tolerance: Patient tolerated treatment well Patient left: in bed;with call bell/phone within reach;with family/visitor present (bed alarm not set upon PT arrival to room) Nurse Communication: Mobility status PT Visit Diagnosis: Difficulty in walking, not elsewhere classified (R26.2)     Time: 8841-8792 PT Time Calculation (min) (ACUTE ONLY): 9 min  Charges:    $Therapeutic Activity: 8-22 mins PT General Charges $$ ACUTE PT VISIT: 1 Visit                     Johana RAMAN, PT DPT Acute Rehabilitation Services Secure Chat Preferred  Office [phone]    Regina [name] Kingdom 10/13/2023, 4:13 PM

## 2023-10-14 ENCOUNTER — Telehealth: Payer: Self-pay

## 2023-10-14 ENCOUNTER — Other Ambulatory Visit: Payer: Self-pay

## 2023-10-14 ENCOUNTER — Other Ambulatory Visit (HOSPITAL_COMMUNITY): Payer: Self-pay

## 2023-10-14 ENCOUNTER — Telehealth: Payer: Self-pay | Admitting: Physician Assistant

## 2023-10-14 NOTE — Transitions of Care (Post Inpatient/ED Visit) (Signed)
   10/14/2023  Name: Regina [name] MRN: [id number] DOB: March 08, 1966  Today's TOC FU Call Status: Today's TOC FU Call Status:: Unsuccessful Call (1st Attempt) Unsuccessful Call (1st Attempt) Date: 10/14/23  Attempted to reach the patient regarding the most recent Inpatient/ED visit.  Follow Up Plan: Additional outreach attempts will be made to reach the patient to complete the Transitions of Care (Post Inpatient/ED visit) call.   Pasco Lunger BSN, Programmer, Systems   Transitions of Care  [place] / [facility], [facility] Direct Dial Number: [phone]  Fax: [phone]

## 2023-10-14 NOTE — Telephone Encounter (Signed)
 Scheduled appointments per 2/7 scheduling message. Patient is aware of the made appointments and is active on MyChart.

## 2023-10-14 NOTE — Transitions of Care (Post Inpatient/ED Visit) (Signed)
   10/14/2023  Name: Regina [name] MRN: [id number] DOB: [birth year]  Today's TOC FU Call Status: Unsuccessful Call (1st Attempt) Date: 10/14/23  Attempted to reach the patient regarding the most recent Inpatient/ED visit.  Follow Up Plan: Additional outreach attempts will be made to reach the patient to complete the Transitions of Care (Post Inpatient/ED visit) call.   Presently the patient does not have a working phone number and all the calls are going through the daughter's phone and the son work's late evenings but will try and have patient call next time she is near a phone.  My contact information provided.  Pasco Lunger BSN, Programmer, Systems   Transitions of Care  [place] / [facility], [facility] Direct Dial Number: [phone]  Fax: [phone]

## 2023-10-17 ENCOUNTER — Encounter (HOSPITAL_COMMUNITY): Payer: Self-pay | Admitting: Orthopaedic Surgery

## 2023-10-17 ENCOUNTER — Ambulatory Visit: Payer: 59 | Attending: Neurosurgery | Admitting: Physical Therapy

## 2023-10-17 ENCOUNTER — Other Ambulatory Visit: Payer: Self-pay

## 2023-10-17 ENCOUNTER — Inpatient Hospital Stay: Payer: 59 | Attending: Internal Medicine

## 2023-10-17 DIAGNOSIS — C3411 Malignant neoplasm of upper lobe, right bronchus or lung: Secondary | ICD-10-CM | POA: Insufficient documentation

## 2023-10-17 DIAGNOSIS — R000 Tachycardia, unspecified: Secondary | ICD-10-CM | POA: Insufficient documentation

## 2023-10-17 DIAGNOSIS — C7951 Secondary malignant neoplasm of bone: Secondary | ICD-10-CM | POA: Insufficient documentation

## 2023-10-17 DIAGNOSIS — C7931 Secondary malignant neoplasm of brain: Secondary | ICD-10-CM | POA: Insufficient documentation

## 2023-10-18 ENCOUNTER — Other Ambulatory Visit: Payer: Self-pay | Admitting: Radiation Therapy

## 2023-10-18 DIAGNOSIS — C7931 Secondary malignant neoplasm of brain: Secondary | ICD-10-CM

## 2023-10-20 ENCOUNTER — Other Ambulatory Visit: Payer: Self-pay

## 2023-10-21 ENCOUNTER — Other Ambulatory Visit: Payer: Self-pay

## 2023-10-24 ENCOUNTER — Telehealth (INDEPENDENT_AMBULATORY_CARE_PROVIDER_SITE_OTHER): Payer: Self-pay | Admitting: Primary Care

## 2023-10-24 ENCOUNTER — Other Ambulatory Visit: Payer: Self-pay

## 2023-10-24 NOTE — Telephone Encounter (Signed)
 Error

## 2023-10-24 NOTE — Progress Notes (Signed)
I called the pts niece, [name], to see how the patient was doing. [name] states that she stopped by recently to check on the pt. She says she seems to be doing okay and her speech "seems a little better", however she didn't visit long enough to notice any other changes. [name] is aware of the pts follow up appts on Thursday with Cassie at 3pm followed by a PET scan at 3:30. At this time, [name]'s main concern was having transportation for her aunt arranged as she may not always be able to rearrange her work schedule to accommodate the pts transportation needs.  After the conclusion of our conversation, I emailed [employer], Producer, television/film/video, with the pt's name and MRN to follow up on an earlier request made in January. I included a note stating that the pt's primary contact is her niece, [name], and she is who he will need to reach out to to make arrangements.

## 2023-10-24 NOTE — Telephone Encounter (Signed)
Pt came in stating she's out of BPO medication. Medication pt was asking for was not on meds list. Pt was given a virtual atp so that she is able to not go too long without medication per requested.

## 2023-10-25 ENCOUNTER — Telehealth (INDEPENDENT_AMBULATORY_CARE_PROVIDER_SITE_OTHER): Payer: 59 | Admitting: Primary Care

## 2023-10-25 NOTE — Progress Notes (Unsigned)
 Marland Kitchen

## 2023-10-26 ENCOUNTER — Telehealth: Payer: Self-pay | Admitting: Radiation Therapy

## 2023-10-26 ENCOUNTER — Other Ambulatory Visit: Payer: Self-pay | Admitting: Physician Assistant

## 2023-10-26 ENCOUNTER — Other Ambulatory Visit: Payer: Self-pay

## 2023-10-26 ENCOUNTER — Other Ambulatory Visit (INDEPENDENT_AMBULATORY_CARE_PROVIDER_SITE_OTHER): Payer: Self-pay | Admitting: Primary Care

## 2023-10-26 ENCOUNTER — Other Ambulatory Visit (HOSPITAL_BASED_OUTPATIENT_CLINIC_OR_DEPARTMENT_OTHER): Payer: Self-pay

## 2023-10-26 NOTE — Telephone Encounter (Signed)
I called to check in on Regina [name] and to remind her of the appointments she has scheduled tomorrow. She is doing well, and said that she is planning on being her tomorrow. We reviewed the times for the appointments again and that she needs to be NPO for the PET scan. She expressed understanding.  Jalene Mullet R.T.(R)(T) Radiation Special Procedures Lead

## 2023-10-26 NOTE — Telephone Encounter (Signed)
Please send to her primary care provider or prescribing provider.

## 2023-10-27 ENCOUNTER — Other Ambulatory Visit (INDEPENDENT_AMBULATORY_CARE_PROVIDER_SITE_OTHER): Payer: Self-pay | Admitting: Primary Care

## 2023-10-27 ENCOUNTER — Other Ambulatory Visit: Payer: Self-pay

## 2023-10-27 ENCOUNTER — Telehealth: Payer: Self-pay

## 2023-10-27 ENCOUNTER — Inpatient Hospital Stay (HOSPITAL_BASED_OUTPATIENT_CLINIC_OR_DEPARTMENT_OTHER): Payer: 59 | Admitting: Physician Assistant

## 2023-10-27 ENCOUNTER — Encounter (HOSPITAL_COMMUNITY)
Admission: RE | Admit: 2023-10-27 | Discharge: 2023-10-27 | Disposition: A | Discharge status: Home / Self Care | Payer: 59 | Source: Ambulatory Visit | Attending: Internal Medicine | Admitting: Internal Medicine

## 2023-10-27 ENCOUNTER — Ambulatory Visit (INDEPENDENT_AMBULATORY_CARE_PROVIDER_SITE_OTHER): Payer: Self-pay | Admitting: Primary Care

## 2023-10-27 ENCOUNTER — Inpatient Hospital Stay: Payer: 59

## 2023-10-27 VITALS — BP 191/11 | HR 105 | Temp 98.3°F | Resp 16 | Wt 121.4 lb

## 2023-10-27 DIAGNOSIS — C7931 Secondary malignant neoplasm of brain: Secondary | ICD-10-CM | POA: Diagnosis not present

## 2023-10-27 DIAGNOSIS — C3411 Malignant neoplasm of upper lobe, right bronchus or lung: Secondary | ICD-10-CM | POA: Diagnosis present

## 2023-10-27 DIAGNOSIS — R000 Tachycardia, unspecified: Secondary | ICD-10-CM | POA: Diagnosis not present

## 2023-10-27 DIAGNOSIS — I10 Essential (primary) hypertension: Secondary | ICD-10-CM

## 2023-10-27 DIAGNOSIS — C3490 Malignant neoplasm of unspecified part of unspecified bronchus or lung: Secondary | ICD-10-CM | POA: Insufficient documentation

## 2023-10-27 DIAGNOSIS — C7951 Secondary malignant neoplasm of bone: Secondary | ICD-10-CM | POA: Diagnosis not present

## 2023-10-27 LAB — CBC WITH DIFFERENTIAL (CANCER CENTER ONLY)
Abs Immature Granulocytes: 0.2 10*3/uL — ABNORMAL HIGH (ref 0.00–0.07)
Basophils Absolute: 0.1 10*3/uL (ref 0.0–0.1)
Basophils Relative: 1 %
Eosinophils Absolute: 0.1 10*3/uL (ref 0.0–0.5)
Eosinophils Relative: 1 %
HCT: 30.6 % — ABNORMAL LOW (ref 36.0–46.0)
Hemoglobin: 10.6 g/dL — ABNORMAL LOW (ref 12.0–15.0)
Immature Granulocytes: 2 %
Lymphocytes Relative: 19 %
Lymphs Abs: 1.9 10*3/uL (ref 0.7–4.0)
MCH: 29.5 pg (ref 26.0–34.0)
MCHC: 34.6 g/dL (ref 30.0–36.0)
MCV: 85.2 fL (ref 80.0–100.0)
Monocytes Absolute: 0.8 10*3/uL (ref 0.1–1.0)
Monocytes Relative: 8 %
Neutro Abs: 7 10*3/uL (ref 1.7–7.7)
Neutrophils Relative %: 69 %
Platelet Count: 265 10*3/uL (ref 150–400)
RBC: 3.59 MIL/uL — ABNORMAL LOW (ref 3.87–5.11)
RDW: 14.7 % (ref 11.5–15.5)
WBC Count: 10 10*3/uL (ref 4.0–10.5)
nRBC: 0 % (ref 0.0–0.2)

## 2023-10-27 LAB — CMP (CANCER CENTER ONLY)
ALT: 14 U/L (ref 0–44)
AST: 16 U/L (ref 15–41)
Albumin: 3.6 g/dL (ref 3.5–5.0)
Alkaline Phosphatase: 93 U/L (ref 38–126)
Anion gap: 6 (ref 5–15)
BUN: 7 mg/dL (ref 6–20)
CO2: 30 mmol/L (ref 22–32)
Calcium: 9.5 mg/dL (ref 8.9–10.3)
Chloride: 99 mmol/L (ref 98–111)
Creatinine: 0.44 mg/dL (ref 0.44–1.00)
GFR, Estimated: >60 mL/min (ref >60)
Glucose, Bld: 90 mg/dL (ref 70–99)
Potassium: 3.2 mmol/L — ABNORMAL LOW (ref 3.5–5.1)
Sodium: 135 mmol/L (ref 135–145)
Total Bilirubin: 0.3 mg/dL (ref 0.0–1.2)
Total Protein: 6 g/dL — ABNORMAL LOW (ref 6.5–8.1)

## 2023-10-27 LAB — GLUCOSE, CAPILLARY: Glucose-Capillary: 94 mg/dL (ref 70–99)

## 2023-10-27 MED ORDER — AMLODIPINE BESYLATE 10 MG PO TABS
10.0000 mg | ORAL_TABLET | Freq: Every day | ORAL | 0 refills | Status: DC
  Filled 2023-10-27: qty 15, 15d supply, fill #0

## 2023-10-27 MED ORDER — FLUDEOXYGLUCOSE F - 18 (FDG) INJECTION
6.6000 | Freq: Once | INTRAVENOUS | Status: AC
  Administered 2023-10-27: 6.6 via INTRAVENOUS

## 2023-10-27 NOTE — Telephone Encounter (Signed)
Noted. Pt seen oncology today and they prescribed her bp medication

## 2023-10-27 NOTE — Telephone Encounter (Signed)
 Error

## 2023-10-27 NOTE — Progress Notes (Unsigned)
The appointment today had to be cancelled due to appointment conflicts with her PET scan  DIAGNOSIS: Stage IV (t1c, N3, M1 C) non-small cell lung cancer, adenocarcinoma presented with right upper lobe lung mass in addition to extensive bilateral hilar, mediastinal as well as right supraclavicular lymphadenopathy and metastatic disease to the bone in the right arm as well as brain metastasis diagnosed in January 2025.   Molecular Studies: No actionable mutations  TMB: 14 mut/mb  PDL1" 90%  PRIOR THERAPY:  1) Craniotomy and SRS under the care of [name] and [name] on 10/06/23 and 10/07/23 2) Right humeral nailing by [name] due to pathologic fracture on 10/10/23  Regina [name] 58 y.o. female returns to the clinic today for a follow-up visit accompanied by her niece. Unfortunately, I had to cancel her visit today due to it conflicting with her PET scan which is scheduled today at 3:30.  In summary, the patient was recently diagnosed with stage IV non-small cell lung cancer, adenocarcinoma.  She establish care in the clinic with [name] on 09/22/2023.  At her last appointment, [name] was waiting for the results of her molecular studies before determining the best systemic therapy options.  In the interval she was quite busy and had underwent SRS and craniotomy on 10/07/2023.  She also underwent right humeral nailing by orthopedics for the pathologic fracture in her right humerus on 10/10/2023.  She is also been following with transportation and social work due to her transportation constraints.  She is on Keppra for seizure prophylaxis.   We did not have time to address treatment for her cancer due to appointment conflicts. She had her vitals taken that showed elevated BP. She states she has been out of her antihypertensive for 7 days. She states she has talked to her PCP but is not able to get it refilled. She states he BP is running high at home. She believes she was on Norvasc. We  called her PCP's office to see if they can refill her BP meds and they stated since she missed the appointment recently they will not be able to fill the norvasc and recommended she go to the ER and ask the ER provider to fill a 30 day supply until she is seen for a follow up visit. The patient denies any symptoms of hypertension such as headaches, balance changes, chest pain, vision changes or lightheadedness. The patient has a blood pressure cuff at home.   We were going to discuss systemic treatment today but had to cancel the appointment so she can get her PET scan. I will send in 15 tablets of her 10 mg of norvasc until she can be seen by her PCP. She needs to monitor her BP at home to ensure improvement. If not improvement in her BP or should she develop any concerning symptoms such as shortness of breath, chest pain, vision changes, stroke like symptoms, or neurological symptoms, she would need to seek ER evaluation.   I have rescheduled her visit with [place] for Monday 10/31/23. We will reach out to social work to see if they can help set up a ride for her since she has transportation constraints.

## 2023-10-27 NOTE — Telephone Encounter (Signed)
  Chief Complaint: Information only  Disposition: [] ED /[] Urgent Care (no appt availability in office) / [] Appointment(In office/virtual)/ []  [place] [facility]/ [] Home Care/ [] Refused Recommended Disposition /[] [place] [facility]/ [x]  Follow-up with PCP Additional Notes: Nurse Morrie Sheldon from oncology called inquiring about refill on patient's amlodipine. States she is out and her BP today was 191/111. Appears medication was cancelled on 10/14/23 and not on the active list. Patient had an appt with PCP this week, but missed the visit. Patient is currently not with caller and attending another appt. Caller advised that she will direct the patient to ED if needed. Alerting PCP for review. Contacted CAL spoke to [place] to notify her of the patient situation. During call, it appears that oncologist prescribed medication.   Copied from CRM [id number]. Topic: Clinical - Red Word Triage >> Oct 27, 2023  3:20 PM Clayton Bibles wrote: Red Word that prompted transfer to Nurse Triage: Blood Pressure is  191/111 and she is at [facility] Reason for Disposition  Nursing judgment  Answer Assessment - Initial Assessment Questions 1. REASON FOR CALL or QUESTION: "What is your reason for calling today?" or "How can I best help you?" or "What question do you have that I can help answer?"     Received a call from oncology nurse, Morrie Sheldon, requesting a refill on patient norvasc, states the patient is there for a PET scan and BP is 191/111.  Protocols used: Information Only Call - No Triage-A-AH, No Guideline or Reference Available-A-AH

## 2023-10-28 ENCOUNTER — Telehealth: Payer: Self-pay | Admitting: Medical Oncology

## 2023-10-28 NOTE — Telephone Encounter (Signed)
Message sent to cancer center transportation coordinator to set up pt for tranportation to her appts.

## 2023-10-31 ENCOUNTER — Ambulatory Visit: Payer: 59 | Admitting: Internal Medicine

## 2023-10-31 DIAGNOSIS — C3411 Malignant neoplasm of upper lobe, right bronchus or lung: Secondary | ICD-10-CM

## 2023-10-31 NOTE — Progress Notes (Signed)
 Called the pts niece, [name], this morning to see if the pt was okay and that she missed her appt. [name] states that the pt called her because she wasn't aware that she had a transportation and doctors appt. I asked [name] if the pt's cell phone was working, as it hadn't before, and she said yes. She called the pt this morning on it.  I called the pt next to apologize for the miscommunication. I told her we have scheduled her for 8:45 for labs and 9:15 with Dr Arbutus Ped on 2/26. I told the pt I have emailed [employer], transportation coordinator to help arrange transportation for the appts on 2/26. Pt verbalized understanding.

## 2023-11-01 ENCOUNTER — Encounter (INDEPENDENT_AMBULATORY_CARE_PROVIDER_SITE_OTHER): Payer: Self-pay | Admitting: Primary Care

## 2023-11-01 ENCOUNTER — Ambulatory Visit (INDEPENDENT_AMBULATORY_CARE_PROVIDER_SITE_OTHER): Payer: 59 | Admitting: Primary Care

## 2023-11-01 ENCOUNTER — Other Ambulatory Visit: Payer: Self-pay

## 2023-11-01 VITALS — BP 148/76 | HR 97 | Wt 118.2 lb

## 2023-11-01 DIAGNOSIS — I10 Essential (primary) hypertension: Secondary | ICD-10-CM | POA: Diagnosis not present

## 2023-11-01 DIAGNOSIS — C7931 Secondary malignant neoplasm of brain: Secondary | ICD-10-CM | POA: Diagnosis not present

## 2023-11-01 MED ORDER — AMLODIPINE BESYLATE 5 MG PO TABS
5.0000 mg | ORAL_TABLET | Freq: Every day | ORAL | 1 refills | Status: DC
  Filled 2023-11-01: qty 30, 30d supply, fill #0

## 2023-11-01 NOTE — Progress Notes (Unsigned)
 [facility]  Regina [name], is a 58 y.o. female  WJX:[id number]  OZH:[id number]  DOB - 04/18/1966  Chief Complaint  Patient presents with   Medication Refill       Subjective:   Regina [name] is a 58 y.o. female here today for a follow up visit. Patient has No headache, No chest pain, No abdominal pain - No Nausea, No new weakness tingling or numbness, No Cough - shortness of breath.  Spoke with oncologist will need to have blood pressure still controlled placing back on previous medication on a lower dose.   No problems updated.  Comprehensive ROS Pertinent positive and negative noted in HPI   Allergies  Allergen Reactions   Pork Allergy Nausea And Vomiting   Bee Venom Hives    Past Medical History:  Diagnosis Date   Allergy    Anxiety    Brain mass    COPD (chronic obstructive pulmonary disease) (HCC)    Emphysema of lung (HCC)    Hypertension     Current Outpatient Medications on File Prior to Visit  Medication Sig Dispense Refill   acetaminophen (TYLENOL) 325 MG tablet Take 2 tablets (650 mg total) by mouth every 6 (six) hours as needed for mild pain (pain score 1-3) (or Fever >/= 101). 30 tablet 0   levETIRAcetam (KEPPRA) 500 MG tablet Take 1 tablet (500 mg total) by mouth 2 (two) times daily. 60 tablet 0   nutrition supplement, JUVEN, (JUVEN) PACK Take 1 packet by mouth 2 (two) times daily between meals. 60 packet 0   oxyCODONE (ROXICODONE) 5 MG immediate release tablet Take 1 tablet (5 mg total) by mouth every 4 (four) hours as needed for severe pain (pain score 7-10). 30 tablet 0   No current facility-administered medications on file prior to visit.   Health Maintenance  Topic Date Due   COVID-19 Vaccine (1) Never done   Pneumococcal Vaccination (1 of 2 - PCV) Never done   Hepatitis C Screening  Never done   Zoster (Shingles) Vaccine (1 of 2) Never done   Pap with HPV screening  Never done   Colon Cancer Screening  Never done   Flu  Shot  12/05/2023*   Mammogram  06/22/2025   DTaP/Tdap/Td vaccine (2 - Td or Tdap) 10/30/2030   HIV Screening  Completed   HPV Vaccine  Aged Out  *Topic was postponed. The date shown is not the original due date.    Objective:   Vitals:   11/01/23 1004  BP: (!) 148/76  Pulse: 97  SpO2: 97%  Weight: 118 lb 3.2 oz (53.6 kg)   BP Readings from Last 3 Encounters:  11/02/23 (!) 157/85  11/01/23 (!) 148/76  10/27/23 (!) 191/11      Physical Exam Vitals reviewed.  Constitutional:      Appearance: Normal appearance.  HENT:     Head: Atraumatic.     Comments: Surgical stables frontal head no signs and symptoms of infection Cardiovascular:     Rate and Rhythm: Normal rate and regular rhythm.  Pulmonary:     Effort: Pulmonary effort is normal.     Breath sounds: Normal breath sounds.  Abdominal:     General: Bowel sounds are normal.     Palpations: Abdomen is soft.  Musculoskeletal:        General: Normal range of motion.     Cervical back: Normal range of motion.  Skin:    General: Skin is warm and dry.  Neurological:  Mental Status: She is alert and oriented to person, place, and time.  Psychiatric:        Mood and Affect: Mood normal.        Behavior: Behavior normal.    Assessment & Plan  Regina [name] was seen today for medication refill.  Diagnoses and all orders for this visit:  Essential hypertension BP goal - < 130/80 Explained that having normal blood pressure is the goal and medications are helping to get to goal and maintain normal blood pressure. DIET: Limit salt intake, read nutrition labels to check salt content, limit fried and high fatty foods  Avoid using multisymptom OTC cold preparations that generally contain sudafed which can rise BP. Consult with pharmacist on best cold relief products to use for persons with HTN EXERCISE Discussed incorporating exercise such as walking - 30 minutes most days of the week and can do in 10 minute intervals     Reduce amlodipine from 10 mg to 5 mg  Metastasis to brain (HCC) Followed by oncology  Other orders -     amLODipine (NORVASC) 5 MG tablet; Take 1 tablet (5 mg total) by mouth daily.     Patient have been counseled extensively about nutrition and exercise. Other issues discussed during this visit include: low cholesterol diet, weight control and daily exercise, foot care, annual eye examinations at Ophthalmology, importance of adherence with medications and regular follow-up. We also discussed long term complications of uncontrolled diabetes and hypertension.   Blood pressure check 2 to 3 weeks  The patient was given clear instructions to go to ER or return to medical center if symptoms don't improve, worsen or new problems develop. The patient verbalized understanding. The patient was told to call to get lab results if they haven't heard anything in the next week.   This note has been created with Education officer, environmental. Any transcriptional errors are unintentional.   [name], NP 11/02/2023, 2:06 PM

## 2023-11-02 ENCOUNTER — Inpatient Hospital Stay: Payer: 59

## 2023-11-02 ENCOUNTER — Inpatient Hospital Stay (HOSPITAL_BASED_OUTPATIENT_CLINIC_OR_DEPARTMENT_OTHER): Payer: 59 | Admitting: Internal Medicine

## 2023-11-02 ENCOUNTER — Other Ambulatory Visit: Payer: Self-pay

## 2023-11-02 VITALS — BP 157/85 | HR 156 | Temp 98.2°F | Resp 17 | Ht 66.0 in | Wt 118.8 lb

## 2023-11-02 DIAGNOSIS — C3411 Malignant neoplasm of upper lobe, right bronchus or lung: Secondary | ICD-10-CM | POA: Diagnosis not present

## 2023-11-02 LAB — CBC WITH DIFFERENTIAL (CANCER CENTER ONLY)
Abs Immature Granulocytes: 0.09 10*3/uL — ABNORMAL HIGH (ref 0.00–0.07)
Basophils Absolute: 0.1 10*3/uL (ref 0.0–0.1)
Basophils Relative: 1 %
Eosinophils Absolute: 0.2 10*3/uL (ref 0.0–0.5)
Eosinophils Relative: 2 %
HCT: 35.9 % — ABNORMAL LOW (ref 36.0–46.0)
Hemoglobin: 12.1 g/dL (ref 12.0–15.0)
Immature Granulocytes: 1 %
Lymphocytes Relative: 27 %
Lymphs Abs: 2.1 10*3/uL (ref 0.7–4.0)
MCH: 29.2 pg (ref 26.0–34.0)
MCHC: 33.7 g/dL (ref 30.0–36.0)
MCV: 86.5 fL (ref 80.0–100.0)
Monocytes Absolute: 0.6 10*3/uL (ref 0.1–1.0)
Monocytes Relative: 8 %
Neutro Abs: 4.7 10*3/uL (ref 1.7–7.7)
Neutrophils Relative %: 61 %
Platelet Count: 460 10*3/uL — ABNORMAL HIGH (ref 150–400)
RBC: 4.15 MIL/uL (ref 3.87–5.11)
RDW: 14.8 % (ref 11.5–15.5)
WBC Count: 7.8 10*3/uL (ref 4.0–10.5)
nRBC: 0 % (ref 0.0–0.2)

## 2023-11-02 LAB — CMP (CANCER CENTER ONLY)
ALT: 13 U/L (ref 0–44)
AST: 16 U/L (ref 15–41)
Albumin: 3.9 g/dL (ref 3.5–5.0)
Alkaline Phosphatase: 110 U/L (ref 38–126)
Anion gap: 6 (ref 5–15)
BUN: 5 mg/dL — ABNORMAL LOW (ref 6–20)
CO2: 30 mmol/L (ref 22–32)
Calcium: 9.9 mg/dL (ref 8.9–10.3)
Chloride: 102 mmol/L (ref 98–111)
Creatinine: 0.51 mg/dL (ref 0.44–1.00)
GFR, Estimated: >60 mL/min (ref >60)
Glucose, Bld: 106 mg/dL — ABNORMAL HIGH (ref 70–99)
Potassium: 3.2 mmol/L — ABNORMAL LOW (ref 3.5–5.1)
Sodium: 138 mmol/L (ref 135–145)
Total Bilirubin: 0.3 mg/dL (ref 0.0–1.2)
Total Protein: 6.9 g/dL (ref 6.5–8.1)

## 2023-11-02 MED ORDER — PROCHLORPERAZINE MALEATE 10 MG PO TABS
10.0000 mg | ORAL_TABLET | Freq: Four times a day (QID) | ORAL | 1 refills | Status: DC | PRN
  Filled 2023-11-02: qty 30, 8d supply, fill #0

## 2023-11-02 MED ORDER — LIDOCAINE-PRILOCAINE 2.5-2.5 % EX CREA
TOPICAL_CREAM | CUTANEOUS | 3 refills | Status: DC
  Filled 2023-11-02: qty 30, 30d supply, fill #0

## 2023-11-02 MED ORDER — ONDANSETRON HCL 8 MG PO TABS
8.0000 mg | ORAL_TABLET | Freq: Three times a day (TID) | ORAL | 1 refills | Status: DC | PRN
  Filled 2023-11-02: qty 18, 21d supply, fill #0

## 2023-11-02 NOTE — Progress Notes (Signed)
 I met with pt face to face today at her f/u appt with Dr Arbutus Ped. Pt is opting to forego chemotherapy and have immunotherapy only at this time. Pt is willing to have a port placed, which she would like to have done at [facility]. Pt is aware that she will need a chemotherapy education class prior to her treatment starting. Pt had no additional questions at this time. I provided her with my business card and she is aware she can call me with any questions or concerns.

## 2023-11-02 NOTE — Progress Notes (Signed)
 [facility] Telephone:([phone]) [phone]   [phone]  OFFICE PROGRESS NOTE  [name], NP [id number] [place] [address]  DIAGNOSIS: Stage IV (T1c, N3, M1 C) non-small cell lung cancer, adenocarcinoma presented with right upper lobe lung mass in addition to extensive bilateral hilar, mediastinal as well as right supraclavicular lymphadenopathy and metastatic disease to the bone in the right arm as well as brain metastasis diagnosed in January 2025.    Molecular Studies: No actionable mutations   TMB: 14 mut/mb   PDL1" 90%   PRIOR THERAPY:  1) Craniotomy and SRS under the care of [name] and [name] on 10/06/23 and 10/07/23 2) Right humeral nailing by [name] due to pathologic fracture on 10/10/23  CURRENT THERAPY: Pembrolizumab 200 Mg IV every 3 weeks.  First dose November 09, 2023  INTERVAL HISTORY: Regina [name] 57 y.o. female returns to the clinic today for follow-up visit.Discussed the use of AI scribe software for clinical note transcription with the patient, who gave verbal consent to proceed.  History of Present Illness   Regina [name] is a 58 year old female with stage four non-small cell lung cancer adenocarcinoma who presents for follow-up.  She was diagnosed with stage four non-small cell lung cancer adenocarcinoma in January 2025. She has undergone resection of a brain tumor and stereotactic radiosurgery to the area. Additionally, she received radiation therapy to the right arm and a procedure to address right arm metastasis. Molecular studies of her tumor did not reveal actionable mutations, but her PD-L1 marker was very high at 90%.  Her heart rate was noted to be high at 156 beats per minute upon arrival. She denies any history of atrial fibrillation. She mentions an argument with her son earlier in the day, which she believes has caused her blood pressure to rise.  Today, she feels 'all right' but notes that her right  arm is 'hurting real bad today.' No chest pain, breathing issues, coughing blood, weight loss, nausea, vomiting, or diarrhea. She notes that she has gained weight recently.       MEDICAL HISTORY: Past Medical History:  Diagnosis Date   Allergy    Anxiety    Brain mass    COPD (chronic obstructive pulmonary disease) (HCC)    Emphysema of lung (HCC)    Hypertension     ALLERGIES:  is allergic to pork allergy and bee venom.  MEDICATIONS:  Current Outpatient Medications  Medication Sig Dispense Refill   acetaminophen (TYLENOL) 325 MG tablet Take 2 tablets (650 mg total) by mouth every 6 (six) hours as needed for mild pain (pain score 1-3) (or Fever >/= 101). 30 tablet 0   amLODipine (NORVASC) 5 MG tablet Take 1 tablet (5 mg total) by mouth daily. 90 tablet 1   levETIRAcetam (KEPPRA) 500 MG tablet Take 1 tablet (500 mg total) by mouth 2 (two) times daily. 60 tablet 0   nutrition supplement, JUVEN, (JUVEN) PACK Take 1 packet by mouth 2 (two) times daily between meals. 60 packet 0   oxyCODONE (ROXICODONE) 5 MG immediate release tablet Take 1 tablet (5 mg total) by mouth every 4 (four) hours as needed for severe pain (pain score 7-10). 30 tablet 0   No current facility-administered medications for this visit.    SURGICAL HISTORY:  Past Surgical History:  Procedure Laterality Date   APPLICATION OF CRANIAL NAVIGATION Left 10/07/2023   Procedure: APPLICATION OF CRANIAL NAVIGATION;  Surgeon: Coletta Memos, MD;  Location: [facility] OR;  Service: Neurosurgery;  Laterality: Left;   CRANIOTOMY Left 10/07/2023   Procedure: FRONTAL CRANIOTOMY FOR ABSCESS;  Surgeon: Coletta Memos, MD;  Location: [facility] OR;  Service: Neurosurgery;  Laterality: Left;   dental procedure     HUMERUS IM NAIL Right 10/10/2023   Procedure: INTRAMEDULLARY (IM) NAIL HUMERAL RIGHT;  Surgeon: Huel Cote, MD;  Location: [facility] OR;  Service: Orthopedics;  Laterality: Right;    REVIEW OF SYSTEMS:  Constitutional: positive for  fatigue Eyes: negative Ears, nose, mouth, throat, and face: negative Respiratory: negative Cardiovascular: negative Gastrointestinal: negative Genitourinary:negative Integument/breast: negative Hematologic/lymphatic: negative Musculoskeletal:positive for bone pain Neurological: negative Behavioral/Psych: negative Endocrine: negative Allergic/Immunologic: negative   PHYSICAL EXAMINATION: General appearance: alert, cooperative, fatigued, and no distress Head: Normocephalic, without obvious abnormality, atraumatic Neck: no adenopathy, no JVD, supple, symmetrical, trachea midline, and thyroid not enlarged, symmetric, no tenderness/mass/nodules Lymph nodes: Cervical, supraclavicular, and axillary nodes normal. Resp: clear to auscultation bilaterally Back: symmetric, no curvature. ROM normal. No CVA tenderness. Cardio: regular rate and rhythm, S1, S2 normal, no murmur, click, rub or gallop GI: soft, non-tender; bowel sounds normal; no masses,  no organomegaly Extremities: extremities normal, atraumatic, no cyanosis or edema Neurologic: Alert and oriented X 3, normal strength and tone. Normal symmetric reflexes. Normal coordination and gait  ECOG PERFORMANCE STATUS: 1 - Symptomatic but completely ambulatory  Blood pressure (!) 157/85, pulse (!) 156, temperature 98.2 F (36.8 C), temperature source Temporal, resp. rate 17, height 5\' 6"  (1.676 m), weight 118 lb 12.8 oz (53.9 kg), last menstrual period 06/06/2010, SpO2 99%.  LABORATORY DATA: Lab Results  Component Value Date   WBC 7.8 11/02/2023   HGB 12.1 11/02/2023   HCT 35.9 (L) 11/02/2023   MCV 86.5 11/02/2023   PLT 460 (H) 11/02/2023      Chemistry      Component Value Date/Time   NA 135 10/27/2023 1455   NA 130 (L) 10/19/2017 0942   K 3.2 (L) 10/27/2023 1455   CL 99 10/27/2023 1455   CO2 30 10/27/2023 1455   BUN 7 10/27/2023 1455   BUN 4 (L) 10/19/2017 0942   CREATININE 0.44 10/27/2023 1455      Component Value  Date/Time   CALCIUM 9.5 10/27/2023 1455   ALKPHOS 93 10/27/2023 1455   AST 16 10/27/2023 1455   ALT 14 10/27/2023 1455   BILITOT 0.3 10/27/2023 1455       RADIOGRAPHIC STUDIES: NM PET Image Initial (PI) Skull Base To Thigh (F-18 FDG) Result Date: 10/28/2023 CLINICAL DATA:  Initial treatment strategy for stage IV non-small cell lung cancer with pathologic right humerus fracture post fixation and with brain metastasis. EXAM: NUCLEAR MEDICINE PET SKULL BASE TO THIGH TECHNIQUE: 6.6 mCi F-18 FDG was injected intravenously. Full-ring PET imaging was performed from the skull base to thigh after the radiotracer. CT data was obtained and used for attenuation correction and anatomic localization. Fasting blood glucose: 94 mg/dl COMPARISON:  40/98/1191 CT chest, abdomen and pelvis. FINDINGS: Mediastinal blood pool activity: SUV max 2.0 Liver activity: SUV max NA NECK: No hypermetabolic lymph nodes in the neck. Incidental CT findings: None. CHEST: Intensely hypermetabolic lobulated solid 2.6 x 2.0 cm peripheral apical right upper lobe pulmonary nodule with max SUV 13.6 (series 7/image 14). No additional hypermetabolic pulmonary findings. Low level hypermetabolism associated with faintly calcified paratracheal, AP window, subcarinal and bilateral hilar lymph nodes. Representative faintly calcified 1.4 cm right paratracheal node with max SUV 3.6 (series 4/image 56). Right hilar adenopathy with max SUV  3.1 and left hilar adenopathy with max SUV 2.8. Incidental CT findings: Trace layering right pleural effusion. Moderate paraseptal and centrilobular emphysema with mild diffuse bronchial wall thickening. Patchy reticulation and apparent honeycombing at the lung bases. Atherosclerotic nonaneurysmal thoracic aorta. ABDOMEN/PELVIS: No abnormal hypermetabolic activity within the liver, pancreas, adrenal glands, or spleen. No hypermetabolic lymph nodes in the abdomen or pelvis. Incidental CT findings: Atherosclerotic  nonaneurysmal abdominal aorta. Calcified top-normal size uterus with presumed small degenerative fibroids. SKELETON: Hypermetabolic medial lower left sacral lesion with max SUV 12.9, CT occult. Large hypermetabolic expansile lytic destructive right mid humerus shaft 7 cm lesion with max SUV 18.0 status post transfixation of pathologic right mid humeral shaft fracture with intramedullary rod with proximal and distal interlocking screws. Incidental CT findings: None. IMPRESSION: 1. Intensely hypermetabolic lobulated solid 2.6 cm peripheral apical right upper lobe pulmonary nodule, compatible with primary bronchogenic malignancy. 2. Hypermetabolic large expansile lytic destructive right mid humerus shaft 7 cm bone metastasis status post transfixation of pathologic right mid humerus shaft fracture. 3. Hypermetabolic medial lower left sacral bone metastasis, CT occult. 4. Low level hypermetabolism associated with faintly calcified mediastinal and bilateral hilar lymph nodes, favoring sequela of prior granulomatous disease, likely sarcoidosis. 5. Trace layering right pleural effusion. 6. Aortic Atherosclerosis (ICD10-I70.0) and Emphysema (ICD10-J43.9). Electronically Signed   By: Delbert Phenix M.D.   On: 10/28/2023 11:24   DG Humerus Right Result Date: 10/10/2023 CLINICAL DATA:  Elective surgery. EXAM: RIGHT HUMERUS - 2+ VIEW COMPARISON:  Radiograph 10/01/2023 FINDINGS: Eight fluoroscopic spot views of the right humerus obtained in the operating room. Humeral intramedullary nail with proximal and distal locking screw fixation traverse pathologic humeral fracture. Fluoroscopy time 1 minutes 20 seconds. Dose 2.84 mGy. IMPRESSION: Procedural fluoroscopy during right humeral fracture fixation. Electronically Signed   By: Narda Rutherford M.D.   On: 10/10/2023 18:08   DG C-Arm 1-60 Min-No Report Result Date: 10/10/2023 Fluoroscopy was utilized by the requesting physician.  No radiographic interpretation.   CT HEAD W & WO  CONTRAST ( ) Result Date: 10/10/2023 CLINICAL DATA:  Provided history: Brain metastases suspected. EXAM: CT HEAD WITHOUT AND WITH CONTRAST TECHNIQUE: Contiguous axial images were obtained from the base of the skull through the vertex without and with intravenous contrast. RADIATION DOSE REDUCTION: This exam was performed according to the departmental dose-optimization program which includes automated exposure control, adjustment of the mA and/or kV according to patient size and/or use of iterative reconstruction technique. CONTRAST:  75mL OMNIPAQUE IOHEXOL 350 MG/ML SOLN COMPARISON:  Prior brain MRI examinations 10/01/2023 and earlier. Head CT 09/17/2023. FINDINGS: Brain: Post-operative changes from interval left frontal craniotomy and partial resection of a left frontal lobe lesion. The residual enhancing portion of the lesion measures 2.0 x 1.8 x 1.3 cm. Small amount of post-operative hemorrhage within the lesion and along the resection tract. Prominent surrounding vasogenic edema persists. Mass effect persists but has slightly decreased, with midline shift now measuring 7 mm (previously 8 mm). Extra-axial collection containing fluid, pneumocephalus and a small amount of post-operative hemorrhage deep to the left frontal cranioplasty, measuring 10 mm in greatest thickness. No new enhancing intracranial lesion identified. Vascular: No hyperdense vessel on pre-contrast imaging. Atherosclerotic calcifications. Enhancement of the proximal large arterial vessels and dural venous sinuses Skull: Left frontal cranioplasty. Sinuses/Orbits: No mass or acute finding within the imaged orbits. No significant paranasal sinus disease at the imaged levels. Other: Scalp staples overlying the left frontal cranioplasty. IMPRESSION: 1. Post-operative changes from interval left frontal craniotomy and partial resection  of a left frontal lobe lesion. The residual enhancing portion of the lesion measures 2.0 x 1.8 x 1.3 cm. Small  amount of post-operative hemorrhage within the lesion and along the resection tract. Prominent surrounding vasogenic edema persists. Mass effect persists but has slightly decreased, with midline shift now measuring 7 mm. By report, there were findings suspicious for a brain abscess at surgery. However, alternative differential considerations based on the imaging appearance include metastasis and high-grade primary CNS neoplasm. Please correlate with the pathology report from the recent partial resection. 2. Extra-axial collection containing fluid, pneumocephalus and a small amount of post-operative hemorrhage deep to the left frontal cranioplasty, measuring up to 10 mm in thickness. Electronically Signed   By: Jackey Loge D.O.   On: 10/10/2023 15:33   EEG adult Result Date: 10/04/2023 Charlsie Quest, MD     10/04/2023  8:42 AM Patient Name: Camaya Gannett MRN: [id number] Epilepsy Attending: Charlsie Quest Referring Physician/Provider: Rolly Salter, MD Date: 10/04/2023 Duration: 23.15 mins Patient history: 58yo F with ams. EEG to evaluate for seizure Level of alertness: Awake, asleep AEDs during EEG study: LEV Technical aspects: This EEG study was done with scalp electrodes positioned according to the 10-20 International system of electrode placement. Electrical activity was reviewed with band pass filter of 1-70Hz , sensitivity of 7 uV/mm, display speed of 96mm/sec with a 60Hz  notched filter applied as appropriate. EEG data were recorded continuously and digitally stored.  Video monitoring was available and reviewed as appropriate. Description: The posterior dominant rhythm consists of 7.5Hz  predominantly in posterior head regions, symmetric and reactive to eye opening and eye closing. Sleep was characterized by vertex waves, sleep spindles (12 to 14 Hz), maximal frontocentral region. EEG showed continuous 3 to 6 Hz theta-delta slowing in left hemisphere, maximal left fronto-temporal region. Hyperventilation  and photic stimulation were not performed.   ABNORMALITY - Continuous slow,  left hemisphere, maximal left fronto-temporal region IMPRESSION: This study is suggestive of cortical dysfunction arising from  left hemisphere, maximal left fronto-temporal region likely secondary to underlying structural abnormality. No seizures or epileptiform discharges were seen throughout the recording. Priyanka Annabelle Harman    ASSESSMENT AND PLAN: This is a very pleasant 58 years old African-American female with Stage IV (T1c, N3, M1 C) non-small cell lung cancer, adenocarcinoma presented with right upper lobe lung mass in addition to extensive bilateral hilar, mediastinal as well as right supraclavicular lymphadenopathy and metastatic disease to the bone in the right arm as well as brain metastasis diagnosed in January 2025.  Molecular studies showed no actionable mutation and she had PD-L1 expression of 90%.     Stage IV Non-Small Cell Lung Cancer (NSCLC) Adenocarcinoma Diagnosed January 2025. Tumor molecular study showed no actionable mutations. PD-L1 marker at 90%, indicating suitability for immunotherapy. Discussed treatment options: immunotherapy alone or combined with chemotherapy. Patient prefers immunotherapy alone. Explained potential side effects: pneumonitis, diarrhea, skin rash, thyroid inflammation, type 1 diabetes. Emphasized palliative intent to prolong life and improve quality of life. She preferred Immunotherapy alone. - Start immunotherapy with Pembrolizumab next week - Order port placement for infusion - Provide immunotherapy drug handout - Prescribe antiemetic and port site cream - Schedule chemotherapy education class  Tachycardia Heart rate 156 bpm upon arrival, likely stress-induced from argument. No AFib history. Irregular and fast heart rate. - Recheck heart rate - Order EKG - Refer to cardiologist if EKG is concerning  Goals of Care Discussed resuscitation preferences and goals of care.  Prefers limited resuscitation in  reversible conditions like pneumonia, not full resuscitation. Understands current treatment is palliative. - Document resuscitation preferences and goals of care in medical record.   The patient was advised to call immediately if she has any other concerning symptoms in the interval. The patient voices understanding of current disease status and treatment options and is in agreement with the current care plan.  All questions were answered. The patient knows to call the clinic with any problems, questions or concerns. We can certainly see the patient much sooner if necessary.  The total time spent in the appointment was 30 minutes.  Disclaimer: This note was dictated with voice recognition software. Similar sounding words can inadvertently be transcribed and may not be corrected upon review.

## 2023-11-02 NOTE — Progress Notes (Signed)
 Spoke with Victorino Dike from [facility] and she stated that she called patient with port placement appt and she denied the port placement.  Information relayed to [place], [place].

## 2023-11-02 NOTE — Progress Notes (Signed)
 START ON PATHWAY REGIMEN - Non-Small Cell Lung     A cycle is every 21 days:     Pembrolizumab   **Always confirm dose/schedule in your pharmacy ordering system**  Patient Characteristics: Stage IV Metastatic, Nonsquamous, Molecular Analysis Completed, Molecular Alteration Present and Targeted Therapy Exhausted OR KRAS G12C+ or HER2+ Present and No Prior Chemo/Immunotherapy OR No Alteration Present, Initial Chemotherapy/Immunotherapy, PS =  0, 1, No Alteration Present, No Alteration Present, Candidate for Immunotherapy, PD-L1 Expression Positive  ? 50% (TPS) and Immunotherapy Candidate Therapeutic Status: Stage IV Metastatic Histology: Nonsquamous Cell Broad Molecular Profiling Status: Animal nutritionist Analysis Results: No Alteration Present ECOG Performance Status: 1 Chemotherapy/Immunotherapy Line of Therapy: Initial Chemotherapy/Immunotherapy EGFR Exons 18-21 Mutation Testing Status: Completed and Negative ALK Fusion/Rearrangement Testing Status: Completed and Negative BRAF V600 Mutation Testing Status: Completed and Negative KRAS G12C Mutation Testing Status: Completed and Negative MET Exon 14 Mutation Testing Status: Completed and Negative RET Fusion/Rearrangement Testing Status: Completed and Negative HER2 Mutation Testing Status: Completed and Negative NTRK Fusion/Rearrangement Testing Status: Completed and Negative ROS1 Fusion/Rearrangement Testing Status: Completed and Negative Immunotherapy Candidate Status: Candidate for Immunotherapy PD-L1 Expression Status: PD-L1 Positive ? 50% (TPS) Intent of Therapy: Non-Curative / Palliative Intent, Discussed with Patient

## 2023-11-03 ENCOUNTER — Other Ambulatory Visit: Payer: Self-pay

## 2023-11-03 NOTE — Progress Notes (Signed)
 Pharmacist Chemotherapy Monitoring - Initial Assessment    Anticipated start date: 11/10/23   The following has been reviewed per standard work regarding the patient's treatment regimen: The patient's diagnosis, treatment plan and drug doses, and organ/hematologic function Lab orders and baseline tests specific to treatment regimen  The treatment plan start date, drug sequencing, and pre-medications Prior authorization status  Patient's documented medication list, including drug-drug interaction screen and prescriptions for anti-emetics and supportive care specific to the treatment regimen The drug concentrations, fluid compatibility, administration routes, and timing of the medications to be used The patient's access for treatment and lifetime cumulative dose history, if applicable  The patient's medication allergies and previous infusion related reactions, if applicable   Changes made to treatment plan:  N/A  Follow up needed:  Pending authorization for treatment  and port placement   Drusilla Kanner, PharmD, MBA

## 2023-11-04 NOTE — Progress Notes (Incomplete)
Histology and Location of Primary Cancer: Stage IV (T1c, N3, M1 C) non-small cell lung cancer, adenocarcinoma presented with right upper lobe lung mass in addition to extensive bilateral hilar, mediastinal as well as right supraclavicular lymphadenopathy and metastatic disease to the bone in the right arm as well as brain metastasis diagnosed in January 2025.   Sites of Visceral and Bony Metastatic Disease: Right Humerus  Location(s) of Symptomatic Metastases: Right Humerus  Past/Anticipated chemotherapy by medical oncology, if any:  11/02/2023 [name]    10/27/2023 [name] NM PET Image Initial (PI) Skull Base To Thigh CLINICAL DATA:  Initial treatment strategy for stage IV non-small cell lung cancer with pathologic right humerus fracture post fixation and with brain metastasis.  IMPRESSION: 1. Intensely hypermetabolic lobulated solid 2.6 cm peripheral apical right upper lobe pulmonary nodule, compatible with primary bronchogenic malignancy. 2. Hypermetabolic large expansile lytic destructive right mid humerus shaft 7 cm bone metastasis status post transfixation of pathologic right mid humerus shaft fracture. 3. Hypermetabolic medial lower left sacral bone metastasis, CT occult. 4. Low level hypermetabolism associated with faintly calcified mediastinal and bilateral hilar lymph nodes, favoring sequela of prior granulomatous disease, likely sarcoidosis. 5. Trace layering right pleural effusion. 6. Aortic Atherosclerosis (ICD10-I70.0) and Emphysema (ICD10-J43.9).   10/10/2023 Dr. Huel Cote DG Humerus Right CLINICAL DATA: Elective surgery.   IMPRESSION: Procedural fluoroscopy during right humeral fracture fixation.  10/01/2023 Dr. Jacalyn Lefevre DG Humerus Right [phone] Pain [phone]. Right humerus pain.   IMPRESSION: *Interval progression of pathological fracture of mid diaphysis of the right humerus. *Otherwise no acute fracture or dislocation.  10/01/2023 Baldo Ash, PA-C MR Brain with/without Contrast CLINICAL DATA:  New onset right arm weakness and progressive aphasia. Metastatic lung cancer.   Pain on a scale of 0-10 is:    If Spine Met(s), symptoms, if any, include: Bowel/Bladder retention or incontinence (please describe): *** Numbness or weakness in extremities (please describe): *** Current Decadron regimen, if applicable: ***  Ambulatory status? Walker? Wheelchair?: {VQI Ambulatory Status:20974}  SAFETY ISSUES: Prior radiation? {:18581} Pacemaker/ICD? {:18581} Possible current pregnancy? Postmenopausal, LMP 06/06/2010. Is the patient on methotrexate? No  Current Complaints / other details:

## 2023-11-07 ENCOUNTER — Ambulatory Visit: Payer: MEDICAID | Admitting: Radiation Oncology

## 2023-11-07 ENCOUNTER — Ambulatory Visit: Payer: 59

## 2023-11-07 ENCOUNTER — Encounter: Payer: Self-pay | Admitting: Radiation Oncology

## 2023-11-07 ENCOUNTER — Ambulatory Visit
Admission: RE | Admit: 2023-11-07 | Discharge: 2023-11-07 | Disposition: A | Discharge status: Home / Self Care | Payer: 59 | Source: Ambulatory Visit | Attending: Radiation Oncology | Admitting: Radiation Oncology

## 2023-11-07 ENCOUNTER — Other Ambulatory Visit: Payer: 59

## 2023-11-07 ENCOUNTER — Telehealth: Payer: Self-pay

## 2023-11-07 DIAGNOSIS — C7951 Secondary malignant neoplasm of bone: Secondary | ICD-10-CM

## 2023-11-07 NOTE — Telephone Encounter (Signed)
 RN made several attempts on phone to reach patient for appointment no answer and unable to leave voicemail.

## 2023-11-07 NOTE — Progress Notes (Signed)
 Pt reached out to me this morning to confirm if she has an appt at the cancer center today. I reviewed her appts in Epic and let her know that she does have a consult with Radiation Oncology starting at 12:00. Pt verbalized understanding. Pt did not have a Van appt in her chart, and pt states she should be able to get transportation. I asked the pt to reach out to me if she is unable to procure transportation for her appts today.

## 2023-11-07 NOTE — Progress Notes (Signed)
 I called the pt back after reviewing her appts that she has a neuro rehab appt tomorrow at 11am. Pt states she is aware of that appt. No additional questions.

## 2023-11-08 ENCOUNTER — Telehealth: Payer: Self-pay | Admitting: Radiation Oncology

## 2023-11-08 ENCOUNTER — Ambulatory Visit: Payer: MEDICAID | Attending: Neurosurgery | Admitting: Physical Therapy

## 2023-11-08 DIAGNOSIS — M6281 Muscle weakness (generalized): Secondary | ICD-10-CM | POA: Diagnosis present

## 2023-11-08 DIAGNOSIS — M79621 Pain in right upper arm: Secondary | ICD-10-CM | POA: Insufficient documentation

## 2023-11-08 NOTE — Telephone Encounter (Signed)
 3/4 @ 10:45 am Call and spoke to patient to be reschedule for her missed appt on 3/3.  Patient stated had an emergency and could not make it to her appt and requested, if she could be work in this week.  Secure chat sent to [employer] and Chesley Noon, waiting on response.

## 2023-11-08 NOTE — Telephone Encounter (Signed)
 3/4 @ 11:54 am Left voicemail for patient to call our office to confirm appt date/time for 3/5 starting at 8:30 am.  Waiting for call back.

## 2023-11-08 NOTE — Therapy (Signed)
 OUTPATIENT PHYSICAL THERAPY NEURO EVALUATION   Patient Name: Regina [name] MRN: [id number] DOB:11/21/1965, 58 y.o., female Today's Date: 11/08/2023   PCP: [name], NP REFERRING PROVIDER: Coletta Memos, MD  END OF SESSION:  PT End of Session - 11/08/23 1057     Visit Number 1    Number of Visits 1    Date for PT Re-Evaluation 11/08/23    Authorization Type [employer]    PT Start Time 1056    PT Stop Time 1125   eval   PT Time Calculation (min) 29 min    Activity Tolerance Patient limited by pain    Behavior During Therapy WFL for tasks assessed/performed             Past Medical History:  Diagnosis Date   Allergy    Anxiety    Brain mass    COPD (chronic obstructive pulmonary disease) (HCC)    Emphysema of lung (HCC)    Hypertension    Past Surgical History:  Procedure Laterality Date   APPLICATION OF CRANIAL NAVIGATION Left 10/07/2023   Procedure: APPLICATION OF CRANIAL NAVIGATION;  Surgeon: Coletta Memos, MD;  Location: [facility] OR;  Service: Neurosurgery;  Laterality: Left;   CRANIOTOMY Left 10/07/2023   Procedure: FRONTAL CRANIOTOMY FOR ABSCESS;  Surgeon: Coletta Memos, MD;  Location: [facility] OR;  Service: Neurosurgery;  Laterality: Left;   dental procedure     HUMERUS IM NAIL Right 10/10/2023   Procedure: INTRAMEDULLARY (IM) NAIL HUMERAL RIGHT;  Surgeon: Huel Cote, MD;  Location: [facility] OR;  Service: Orthopedics;  Laterality: Right;   Patient Active Problem List   Diagnosis Date Noted   Closed fracture of shaft of right humerus 10/10/2023   Pressure injury of skin 10/10/2023   Cerebral intracranial abscess 10/07/2023   Palliative care encounter 10/04/2023   Pathological fracture of right humerus with delayed healing 10/04/2023   Lung cancer metastatic to brain (HCC) 10/04/2023   Vasogenic brain edema (HCC) 10/04/2023   Counseling and coordination of care 10/04/2023   Protein-calorie malnutrition, severe 10/04/2023   Metastasis to brain (HCC) 10/03/2023    Metastasis to bone (HCC) 10/03/2023   Cerebral edema (HCC) 10/01/2023   Primary adenocarcinoma of upper lobe of right lung (HCC) 09/22/2023   Pathological fracture in neoplastic disease, right humerus, initial encounter for fracture 09/19/2023   Neoplasm causing mass effect and brain compression on adjacent structures (HCC) 09/18/2023   COPD (chronic obstructive pulmonary disease) (HCC)    Anxiety    Hyponatremia 04/16/2018   Depression 04/16/2018   Hypertensive urgency    HTN (hypertension) 01/13/2016   Right anterior shoulder pain 07/18/2014   Postconcussion syndrome 05/28/2014    ONSET DATE: 10/13/2023 (referral date)  REFERRING DIAG: G93.6 (ICD-10-CM) - Cerebral edema (HCC) M80.821G (ICD-10-CM) - 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 Pt unsure if she has any ROM or WB restrictions and unsure if she has any follow-up appointments with her surgeon ([name]). Pt also had a frontal craniotomy on 1/31 but again unsure of any restrictions or follow-up appointments with her surgeon ([name]). Pt also reports having memory impairments and that she typically brings her niece along to appointments, arrived alone today after taking the bus.  Pt reports having significant (10/10) pain in her RUE that hurts worse at night, it  has been impossible to sleep. Pt reports she was initially taking prescribed pain medication but that she ran out of medication, encouraged her to reach out to referring provider for a refill if possible. Pt able to doff her R arm sling and take her arm out of her jacket sleeve with increased time due to pain, limb with visible edema and bruising. Pt reports having difficulty with bathing and dressing as she lives with her brother and does not want him to help her with this.  Per acute care note patient was given AAROM therex to work on with her RUE, pt does not recall this and has not been moving her arm at all at home.  Pt accompanied by: self  PERTINENT HISTORY: PMH: anxiety, COPD, emphysema of lung, HTN  Per acute care PT note 10/13/23: Pt is a 58 y/o female presenting on 1/11 with aphasia, R sided weakness, and R arm pain. Found with R humerus mid shaft pathologic fx, abnormal adenopathy in thoracic cavity. 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 1/31 frontal craniotomy for resection s/p 2/3 R humeral nailing PMH includes: anxiety, COPD, emphysema of lung, HTN.  Hospital Course: Regina [name] is a 58 y.o. female Was readmitted after initial presentation on 1/11. She had missed a few appointments for her treatment including missed appointments to oncology. After admission she underwent a right humerus biopsy at the site of a pathological fracture. 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 She had preoperative radiation to the left frontal mass which had increased in size from the original admission to the readmission. She was taken for a stereotactic craniotomy where I encountered what appeared to be purulent material. White cells were identified, but no organism grew. The wall of the mass was consistent with adenocarcinoma, and the humeral  biopsy. She has improved clinically, repeat CT scan showed the mass still present but smaller with less edema. Treated with clindamycin for a short course. She is discharged home with much improved speech, and expressive aphasia. She is moving left side well,  mild weakness on right side. She is ambulating with assist. Ms. Hines is voiding and tolerating a regular diet at discharge.  Treatments: surgery: craniotomy for left frontal tumor Stereotactic preoperative radiation to left frontal mass   Right humeral nailing   PAIN:  Are you having pain? Yes: NPRS scale: 10/10 Pain location: R arm Pain description: sharp pain, shooting Aggravating factors: movement, trying to use the arm Relieving factors: nothing  PRECAUTIONS: Other: RUE WBAT and AROM or AAROM as tolerated; s/p crani  RED FLAGS: None   WEIGHT BEARING RESTRICTIONS: Yes RUE WBAT  FALLS: Has patient fallen in last 6 months? No  LIVING ENVIRONMENT: Lives  with:  lives with brother Lives in: House/apartment Stairs: No Has following equipment at home: None  PLOF: Independent with gait and Independent with transfers  PATIENT GOALS: "getting well"  OBJECTIVE:  Note: Objective measures were completed at Evaluation unless otherwise noted.  DIAGNOSTIC FINDINGS:  Brain MRI 10/01/23 IMPRESSION: 1. Interval increase in size of the enhancing metastatic lesion in the left frontal lobe over the last 14 days. It now measures 3.2 x 3.4 x 2.5 cm. 2. Progressive mass effect and midline shift, now measuring 8 mm. 3. No acute infarct. Lesion demonstrates restricted diffusion consistent with a high-grade tumor. 4. Right mastoid effusion. No obstructing nasopharyngeal lesion is present.  R humerus xray 10/01/23 IMPRESSION: *Interval progression of pathological fracture of mid diaphysis of the right humerus. *Otherwise no acute fracture or dislocation.  COGNITION: Overall cognitive status:  some memory impairments per patient  report   SENSATION: No N/T per patient report  EDEMA:  Yes, in RUE proximal>distal  POSTURE: heavily guards RUE when not in sling  LOWER EXTREMITY ROM:   not assessed  Active  Right Eval Left Eval  Hip flexion    Hip extension    Hip abduction    Hip adduction    Hip internal rotation    Hip external rotation    Knee flexion    Knee extension    Ankle dorsiflexion    Ankle plantarflexion    Ankle inversion    Ankle eversion     (Blank rows = not tested)  LOWER EXTREMITY MMT:  not assessed  MMT Right Eval Left Eval  Hip flexion    Hip extension    Hip abduction    Hip adduction    Hip internal rotation    Hip external rotation    Knee flexion    Knee extension    Ankle dorsiflexion    Ankle plantarflexion    Ankle inversion    Ankle eversion    (Blank rows = not tested)  BED MOBILITY:  Mod I per patient report, increased time due to pain  TRANSFERS: Assistive device utilized: None  Sit to stand: Modified independence Stand to sit: Modified independence Chair to chair: Modified independence Floor:  not assessed at eval   PATIENT SURVEYS:  QuickDASH: 81/100, 81% impaired UEFS: 7/80, 8.75% of maximal function                                                                                                                              TREATMENT: PT Evaluation    PATIENT EDUCATION: Education details: Eval findings, recommendation for OT and SLP referral and will reach out to referring provider for orders; encouraged pt to reach out to referring provider regarding scheduling a follow-up appointment and for new prescription for pain medication (ortho surgeon and neurosurgeon) Person educated: Patient Education method: Explanation and Handouts Education comprehension: verbalized understanding and needs further education  HOME EXERCISE PROGRAM: N/A  ASSESSMENT:  CLINICAL IMPRESSION: Patient  is a 58 year old female referred to Neuro OPPT for pain and  impaired mobility following a surgical repair of a pathological R humerus fracture.   Pt's PMH is significant for: anxiety, COPD, emphysema of lung, HTN. The following deficits were present during the exam: impaired function and ability to complete ADLs independently, decreased ROM of RUE (observed, not tested due to needing clarification of restrictions), and memory impairments. Pt would benefit from skilled OT and Speech Therapy services to address these impairments and functional limitations to maximize functional mobility independence.   OBJECTIVE IMPAIRMENTS: decreased cognition, decreased knowledge of condition, decreased mobility, decreased ROM, decreased strength, increased edema, impaired perceived functional ability, impaired UE functional use, and pain.   ACTIVITY LIMITATIONS: carrying, lifting, sleeping, bed mobility, bathing, toileting, dressing, reach over head, and hygiene/grooming  PARTICIPATION LIMITATIONS: meal prep, cleaning, laundry, and driving  PERSONAL FACTORS: Transportation and 1-2 comorbidities:    anxiety, COPD, emphysema of lung, HTN are also affecting patient's functional outcome.   CLINICAL DECISION MAKING: Stable/uncomplicated  EVALUATION COMPLEXITY: Moderate  PLAN:  PT FREQUENCY: one time visit  PT DURATION:  1 sessions (eval only, recommend referral to OT and Speech Therapy services)     Peter Congo, PT Peter Congo, PT, DPT, CSRS  11/08/2023, 11:26 AM

## 2023-11-09 ENCOUNTER — Telehealth: Payer: Self-pay

## 2023-11-09 ENCOUNTER — Telehealth: Payer: Self-pay | Admitting: Radiation Oncology

## 2023-11-09 ENCOUNTER — Inpatient Hospital Stay: Payer: MEDICAID

## 2023-11-09 NOTE — Telephone Encounter (Addendum)
 3/5 @ 8:18 am Follow up call/Left voicemail for patient to call our office to be reschedule for her missed appointment on 3/3.  Waiting on call back.

## 2023-11-09 NOTE — Telephone Encounter (Signed)
 Left a message for Ms Hockman stating that she had an education appointment at 11 am  at [facility] 11-09-23. Requested that she call back to R/S.

## 2023-11-09 NOTE — Telephone Encounter (Signed)
 Pt to come for lab at 0915 tomorrow and then treatment. Scheduled her for 0900 Patient education and can see her after lab and do education. And complete in infusion room if necessary

## 2023-11-09 NOTE — Progress Notes (Signed)
 A pt call was routed to be from Lorine Bears, RN, Risk analyst. The pt had no-showed her education appt, but that it could be done after her lab appt and before her infusion. I called the pt to see if she was okay. She states she missed her appts on 3/3 and 3/4 because her brother had a seizure and hit his head. Pt states he is okay now. I told her that she will be able to get her chemo education after her lab appt tomorrow before her treatment. Pt asks "what treatment?" I state "your immunotherapy" "what am I getting that for?" I said "for your lung cancer" Pt then states "I don't have no lung cancer, I feel good. There's no cancer in my body" and "I don't know what I'm getting and I don't want to feel sick" The discussion with Dr Arbutus Ped on 2/26 was for the pt to receive immunotherapy only and forego chemotherapy. Pt had also agreed to radiation to her R arm fracture. Pt was supposed to have a reconsult/CT sim on 3/3, but no showed. I asked if she was still interested in getting the radiation to her arm. Pt states "I don't need any radiation going into me" I explained that the reason she needed the radiation to her arm was because the cancer spread there and caused a fracture. Pt seemed to understand. I told the pt it is up to her if she wants to receive treatment or not, however she may benefit from coming to tomorrow's chemo education class to learn more about the medicine she is going to receive. Pt agreed to this recommendation. I have sent an email to Christian Vilsaint to call the pt and schedule a ride for her tomorrow.

## 2023-11-10 ENCOUNTER — Inpatient Hospital Stay: Payer: MEDICAID

## 2023-11-10 ENCOUNTER — Other Ambulatory Visit: Payer: Self-pay

## 2023-11-10 ENCOUNTER — Inpatient Hospital Stay: Payer: MEDICAID | Attending: Internal Medicine

## 2023-11-10 ENCOUNTER — Telehealth: Payer: Self-pay | Admitting: *Deleted

## 2023-11-10 NOTE — Progress Notes (Signed)
 I received a VM from this patient this morning, it was difficult to hear, but sounded like "I don't want that stuff going into my arm" I am uncertain if the pt means her immunotherapy or the radiation. Pt no-showed her appts for today. I called her back as she requested in her message, but there was no answer and left a VM. Hope to hear back from the pt soon. Will try again later.

## 2023-11-10 NOTE — Telephone Encounter (Signed)
 Patient did not come to appointments at [facility] today for patient education, labs, or infusion.  Patient did not call prior to appts with any information. Contacted patient by phone - message left at preferred number. No message option at home number. Contacted patient's niece Ms Okey Dupre (as noted on chart as alternate contact). LVM that patient had missed medical appts and inquired if Ms Okey Dupre would give patient message to contact MD office.

## 2023-11-11 ENCOUNTER — Telehealth: Payer: Self-pay | Admitting: Radiation Oncology

## 2023-11-11 ENCOUNTER — Telehealth: Payer: Self-pay | Admitting: Internal Medicine

## 2023-11-11 NOTE — Telephone Encounter (Signed)
 Left a voicemail to call us back to reschedule/schedule her aunts appointments.

## 2023-11-11 NOTE — Telephone Encounter (Signed)
 3/7 @ 9:11 am left voicemail for patient to call our office to reschedule her missed appt on 3/3.  Made several attempts to reschedule patient no response from patient.  Sending out unable to contact letter to patient's residence.  Closing referral at this time.

## 2023-11-14 ENCOUNTER — Telehealth: Payer: Self-pay | Admitting: Internal Medicine

## 2023-11-14 ENCOUNTER — Encounter: Payer: Self-pay | Admitting: Internal Medicine

## 2023-11-14 NOTE — Telephone Encounter (Signed)
 Left a voicemail on mobile phone listed. Was unable to leave a voicemail on the home phone. Got in touch with the niece, she stated she would attempt to contact Regina [name] and request that she call us back to reschedule appointments.

## 2023-11-14 NOTE — Progress Notes (Signed)
 The pt left a VM for me earlier today, however it seems to have cut off before she was done speaking, so I called her back at this time for more information. When she answered she said "I don't want to get that thing in my arm" I asked her if she meant the port for treatment. I emphasized that she does not have to have a port to get treatment. It was only recommended because she only has her Left arm for venous access as her R arm is currently fractured. I asked her if that was what was deterring her from treatment and the pt states "no that's not it. I just don't want to feel sick. I feel good now and I don't want to get something that will make me feel sick." I verbalized understanding of the pt's concerns. Pt was also upset that she hadn't seen anyone about her arm. I assumed she meant radiation oncology, as we had discussed at her last appt with Dr.Mohamed on 2/26 that she could receive radiation to the fracture in her R arm. Pt states that she meant the doctor who operated on her arm.  I stated I would have to find the number for her surgeon and will call her back with that information.  I was notified by Algis Downs, heme onc scheduler, via [employer] that she already provided the number for the surgeon to the pt.  I will follow up with the pt at her appt with Dr Arbutus Ped this Thursday.

## 2023-11-14 NOTE — Telephone Encounter (Signed)
 Patient left a voicemail to call her back. Contacted the patient and she stated she no longer wants to proceed with treatment. She confirmed her 3/13 appointments.

## 2023-11-15 ENCOUNTER — Encounter (INDEPENDENT_AMBULATORY_CARE_PROVIDER_SITE_OTHER): Payer: Self-pay

## 2023-11-15 ENCOUNTER — Ambulatory Visit (INDEPENDENT_AMBULATORY_CARE_PROVIDER_SITE_OTHER): Admitting: Student

## 2023-11-15 ENCOUNTER — Ambulatory Visit (INDEPENDENT_AMBULATORY_CARE_PROVIDER_SITE_OTHER): Payer: MEDICAID

## 2023-11-15 ENCOUNTER — Ambulatory Visit (INDEPENDENT_AMBULATORY_CARE_PROVIDER_SITE_OTHER): Payer: 59 | Admitting: Primary Care

## 2023-11-15 ENCOUNTER — Other Ambulatory Visit: Payer: Self-pay

## 2023-11-15 ENCOUNTER — Telehealth: Payer: Self-pay | Admitting: Radiation Therapy

## 2023-11-15 VITALS — BP 138/75 | HR 92

## 2023-11-15 DIAGNOSIS — S42301D Unspecified fracture of shaft of humerus, right arm, subsequent encounter for fracture with routine healing: Secondary | ICD-10-CM

## 2023-11-15 DIAGNOSIS — I10 Essential (primary) hypertension: Secondary | ICD-10-CM

## 2023-11-15 DIAGNOSIS — S42301G Unspecified fracture of shaft of humerus, right arm, subsequent encounter for fracture with delayed healing: Secondary | ICD-10-CM

## 2023-11-15 MED ORDER — CEPHALEXIN 500 MG PO CAPS
500.0000 mg | ORAL_CAPSULE | Freq: Four times a day (QID) | ORAL | 0 refills | Status: AC
  Filled 2023-11-15: qty 20, 5d supply, fill #0

## 2023-11-15 MED ORDER — OXYCODONE HCL 5 MG PO TABS
5.0000 mg | ORAL_TABLET | ORAL | 0 refills | Status: DC | PRN
  Filled 2023-11-15: qty 15, 3d supply, fill #0

## 2023-11-15 NOTE — Telephone Encounter (Signed)
 I have not been able to reach Ms. Lindamood on the phone, but did speak with her niece today. I asked if she was aware that Ms. Streck has not gone forward with her radiation consult to treat her arm or coming in for her Medical Oncology appointments stating that she feels fine and does not want to take anything that is going to make her sick. Unfortunately [name] has also not been able reach Ms. Rainone for about a week, but the last time she spoke with her Ms. Pottle reported to be fine.   I shared with [name] that I read a note from earlier today that Ms. Petrovic has not returned for follow-up with the orthopedic surgeon. Per the provider's evaluation, her arm has limited range of motion, continued pain, swelling and warm to the touch. The provider was concerned that her stitches that have not been removed, may be infected. She was sent to [facility] where the stiches were removed and an xray taken. The history she shared with the radiographer was that she hurt her arm in December reaching into the back seat of her car. She did not share about the tumor or pathologic fracture in her arm requiring surgical stabilizing.   The patient's niece believes Ms. Castillo is in denial but also is not sure that she comprehends her diagnosis or need for treatment. Ms. Wible often tells her that she feels fine, nothing is wrong with her so why would she let anyone do or give her something that will make her feel sick.   I requested that she please reach out if something changes.   Jalene Mullet R.T.(R)(T) Radiation Special Procedures Lead

## 2023-11-15 NOTE — Patient Instructions (Signed)
 Appointment scheduled today and [address] [facility] second floor follow-up on right arm [phone]

## 2023-11-15 NOTE — Progress Notes (Signed)
 [facility]  Regina [name], is a 58 y.o. female  ZOX:[id number]  [id number]  DOB - 1966-01-03  CC right arm pain      Subjective:   Regina [name] is a 58 y.o. female here today for an acute visit.  Patient was initially scheduled for a blood pressure evaluation recheck systolic remains elevated.  Patient hospitalized presented to ED with Aphasia. 1/11 to 1/14 dx diagnosed with right mid humeral shaft fracture.  However her right shoulder with stitches is swollen warm painful to move or touch.  She needs to follow-up with provider Huel Cote MD. Called office to schedule appt he is in surgery and she can be a walk in and see the PA awaiting time. D/W PA stiches still in placed likely infected. Appointment,ent given today at 1pm at the wound center 2nd floor.  No problems updated.  Comprehensive ROS Pertinent positive and negative noted in HPI   Allergies  Allergen Reactions   Pork Allergy Nausea And Vomiting   Bee Venom Hives    Past Medical History:  Diagnosis Date   Allergy    Anxiety    Brain mass    COPD (chronic obstructive pulmonary disease) (HCC)    Emphysema of lung (HCC)    Hypertension     Current Outpatient Medications on File Prior to Visit  Medication Sig Dispense Refill   acetaminophen (TYLENOL) 325 MG tablet Take 2 tablets (650 mg total) by mouth every 6 (six) hours as needed for mild pain (pain score 1-3) (or Fever >/= 101). 30 tablet 0   amLODipine (NORVASC) 5 MG tablet Take 1 tablet (5 mg total) by mouth daily. 90 tablet 1   levETIRAcetam (KEPPRA) 500 MG tablet Take 1 tablet (500 mg total) by mouth 2 (two) times daily. 60 tablet 0   lidocaine-prilocaine (EMLA) cream Apply to affected area once 30 g 3   nutrition supplement, JUVEN, (JUVEN) PACK Take 1 packet by mouth 2 (two) times daily between meals. 60 packet 0   ondansetron (ZOFRAN) 8 MG tablet Take 1 tablet (8 mg total) by mouth every 8 (eight) hours as needed for nausea or  vomiting. 30 tablet 1   oxyCODONE (ROXICODONE) 5 MG immediate release tablet Take 1 tablet (5 mg total) by mouth every 4 (four) hours as needed for severe pain (pain score 7-10). 30 tablet 0   prochlorperazine (COMPAZINE) 10 MG tablet Take 1 tablet (10 mg total) by mouth every 6 (six) hours as needed for nausea or vomiting. 30 tablet 1   No current facility-administered medications on file prior to visit.   Health Maintenance  Topic Date Due   COVID-19 Vaccine (1) Never done   Pneumococcal Vaccination (1 of 2 - PCV) Never done   Hepatitis C Screening  Never done   Zoster (Shingles) Vaccine (1 of 2) Never done   Pap with HPV screening  Never done   Colon Cancer Screening  Never done   Flu Shot  12/05/2023*   Mammogram  06/22/2025   DTaP/Tdap/Td vaccine (2 - Td or Tdap) 10/30/2030   HIV Screening  Completed   HPV Vaccine  Aged Out  *Topic was postponed. The date shown is not the original due date.    Objective:   Vitals:   11/15/23 0936  BP: (!) 151/80  Pulse: 92     Physical Exam Vitals reviewed.  Constitutional:      Appearance: Normal appearance.  HENT:     Head: Normocephalic.     Right  Ear: Tympanic membrane, ear canal and external ear normal.     Left Ear: Tympanic membrane, ear canal and external ear normal.     Nose: Nose normal.     Mouth/Throat:     Mouth: Mucous membranes are moist.  Eyes:     Extraocular Movements: Extraocular movements intact.     Pupils: Pupils are equal, round, and reactive to light.  Cardiovascular:     Rate and Rhythm: Normal rate and regular rhythm.  Pulmonary:     Effort: Pulmonary effort is normal.     Breath sounds: Normal breath sounds.  Abdominal:     General: Bowel sounds are normal.     Palpations: Abdomen is soft.  Musculoskeletal:        General: Swelling and tenderness present. Normal range of motion.     Cervical back: Normal range of motion.     Comments: Pain right arm see pictures. Unable to life right arm  Skin:     General: Skin is warm and dry.  Neurological:     Mental Status: She is alert and oriented to person, place, and time.  Psychiatric:        Mood and Affect: Mood normal.        Behavior: Behavior normal.        Thought Content: Thought content normal.     Assessment & Plan  Diagnoses and all orders for this visit:  Closed fracture of shaft of right humerus with delayed healing, unspecified fracture morphology, subsequent encounter See HPI  Essential hypertension BP goal - < 130/80 elevated secondary to pain Explained that having normal blood pressure is the goal and medications are helping to get to goal and maintain normal blood pressure. DIET: Limit salt intake, read nutrition labels to check salt content, limit fried and high fatty foods  Avoid using multisymptom OTC cold preparations that generally contain sudafed which can rise BP. Consult with pharmacist on best cold relief products to use for persons with HTN EXERCISE Discussed incorporating exercise such as walking - 30 minutes most days of the week and can do in 10 minute intervals      Patient have been counseled extensively about nutrition and exercise. Other issues discussed during this visit include: low cholesterol diet, weight control and daily exercise, foot care, annual eye examinations at Ophthalmology, importance of adherence with medications and regular follow-up. We also discussed long term complications of uncontrolled diabetes and hypertension.   The patient was given clear instructions to go to ER or return to medical center if symptoms don't improve, worsen or new problems develop. The patient verbalized understanding. The patient was told to call to get lab results if they haven't heard anything in the next week.   This note has been created with Education officer, environmental. Any transcriptional errors are unintentional.   [name], NP 11/15/2023, 10:17 AM

## 2023-11-15 NOTE — Progress Notes (Signed)
 Post Operative Evaluation    Procedure/Date of Surgery: Right humeral nail 10/10/2023  Interval History:   Patient presents today 5 weeks status post IM nailing of the right humerus due to a pathologic fracture.  This is her first postop evaluation.  She does report continued pain that has been keeping her up at night.  She used oxycodone for pain after surgery but has now just been taking over-the-counter pain medication.  She has a follow-up with [name] this afternoon.   PMH/PSH/Family History/Social History/Meds/Allergies:    Past Medical History:  Diagnosis Date   Allergy    Anxiety    Brain mass    COPD (chronic obstructive pulmonary disease) (HCC)    Emphysema of lung (HCC)    Hypertension    Past Surgical History:  Procedure Laterality Date   APPLICATION OF CRANIAL NAVIGATION Left 10/07/2023   Procedure: APPLICATION OF CRANIAL NAVIGATION;  Surgeon: Coletta Memos, MD;  Location: [facility] OR;  Service: Neurosurgery;  Laterality: Left;   CRANIOTOMY Left 10/07/2023   Procedure: FRONTAL CRANIOTOMY FOR ABSCESS;  Surgeon: Coletta Memos, MD;  Location: [facility] OR;  Service: Neurosurgery;  Laterality: Left;   dental procedure     HUMERUS IM NAIL Right 10/10/2023   Procedure: INTRAMEDULLARY (IM) NAIL HUMERAL RIGHT;  Surgeon: Huel Cote, MD;  Location: [facility] OR;  Service: Orthopedics;  Laterality: Right;   Social History   Socioeconomic History   Marital status: Widowed    Spouse name: Not on file   Number of children: 1    Years of education: 42    Highest education level: Not on file  Occupational History   Occupation: Set designer: [employer]    Comment: Nurse, mental health   Tobacco Use   Smoking status: Former    Current packs/day: 0.00    Types: Cigarettes    Quit date: 10/31/2019    Years since quitting: 4.0   Smokeless tobacco: Never  Vaping Use   Vaping status: Never Used  Substance and Sexual Activity   Alcohol  use: Yes    Alcohol/week: 6.0 standard drinks of alcohol    Types: 6 Cans of beer per week   Drug use: Yes    Frequency: 4.0 times per week    Types: Marijuana   Sexual activity: Not Currently  Other Topics Concern   Not on file  Social History Narrative   Lives with her brother.    Son lives near by, 68 yo, married.    Social Drivers of Corporate investment banker Strain: Not on file  Food Insecurity: No Food Insecurity (11/07/2023)   Hunger Vital Sign    Worried About Running Out of Food in the Last Year: Never true    Ran Out of Food in the Last Year: Never true  Transportation Needs: No Transportation Needs (11/07/2023)   PRAPARE - Administrator, Civil Service (Medical): No    Lack of Transportation (Non-Medical): No  Physical Activity: Not on file  Stress: Not on file  Social Connections: Not on file   Family History  Problem Relation Age of Onset   Leukemia Mother    Hypertension Mother    Hypertension Father    Cancer Neg Hx    Diabetes Neg Hx    Heart disease Neg  Hx    Colon cancer Neg Hx    Esophageal cancer Neg Hx    Rectal cancer Neg Hx    Stomach cancer Neg Hx    Allergies  Allergen Reactions   Pork Allergy Nausea And Vomiting   Bee Venom Hives   Current Outpatient Medications  Medication Sig Dispense Refill   cephALEXin (KEFLEX) 500 MG capsule Take 1 capsule (500 mg total) by mouth 4 (four) times daily for 5 days. 20 capsule 0   oxyCODONE (ROXICODONE) 5 MG immediate release tablet Take 1 tablet (5 mg total) by mouth every 4 (four) hours as needed for severe pain (pain score 7-10) or breakthrough pain. 15 tablet 0   acetaminophen (TYLENOL) 325 MG tablet Take 2 tablets (650 mg total) by mouth every 6 (six) hours as needed for mild pain (pain score 1-3) (or Fever >/= 101). 30 tablet 0   amLODipine (NORVASC) 5 MG tablet Take 1 tablet (5 mg total) by mouth daily. 90 tablet 1   levETIRAcetam (KEPPRA) 500 MG tablet Take 1 tablet (500 mg total) by  mouth 2 (two) times daily. 60 tablet 0   lidocaine-prilocaine (EMLA) cream Apply to affected area once 30 g 3   nutrition supplement, JUVEN, (JUVEN) PACK Take 1 packet by mouth 2 (two) times daily between meals. 60 packet 0   ondansetron (ZOFRAN) 8 MG tablet Take 1 tablet (8 mg total) by mouth every 8 (eight) hours as needed for nausea or vomiting. 30 tablet 1   prochlorperazine (COMPAZINE) 10 MG tablet Take 1 tablet (10 mg total) by mouth every 6 (six) hours as needed for nausea or vomiting. 30 tablet 1   No current facility-administered medications for this visit.   No results found.  Review of Systems:   A ROS was performed including pertinent positives and negatives as documented in the HPI.   Musculoskeletal Exam:    Last menstrual period 06/06/2010.  Proximal incisions are well-appearing without evidence of erythema or drainage and sutures in place.  Distal incision has mild surrounding erythema and warmth without any active drainage.  Patient has arm immobilized in sling.  Limited active range of motion of the shoulder.  Active elbow ROM from 20 to 110 degrees.  Distal neurosensory exam intact.  Imaging:   Xray (right humerus 2 views): Good positioning of humerus IM nail without evidence of damage or loosening    I personally reviewed and interpreted the radiographs.   Assessment:   Patient is 5 weeks status post right humerus nailing.  She does continue to complain of pain particularly at night and is not getting relief with over-the-counter meds.  She has recently begun with occupational therapy however does still remain in the sling.  Sutures had not been removed until today.  Distal suture was relatively deep within the skin and while the surrounding erythema and warmth may be a result of irritation, I would recommend beginning a few days of antibiotics to prevent any further infection.  Discussed that I will give her a few more days of oxycodone but discussed to stretch this  out as long as possible as we will not be able to refill again.  She can continue working with occupational therapy to help improve range of motion and function and return in another 6 weeks for follow-up.  Plan :    -Continue occupational therapy and return to clinic in 6 weeks      I personally saw and evaluated the patient, and participated in the management  and treatment plan.  Hazle Nordmann, PA-C Orthopedics

## 2023-11-16 ENCOUNTER — Other Ambulatory Visit: Payer: Self-pay

## 2023-11-17 ENCOUNTER — Inpatient Hospital Stay: Payer: MEDICAID

## 2023-11-17 ENCOUNTER — Inpatient Hospital Stay: Payer: MEDICAID | Admitting: Internal Medicine

## 2023-11-17 NOTE — Progress Notes (Signed)
 I reached out to the pt at both her mobile and home listed numbers. I received the recording "call cannot be completed as dialed" I called pt's niece, [name], as well. She said she has not been able to reach her either. Last time she tried reaching her was yesterday. I asked [name] to have the pt call me if she is able to reach her.

## 2023-11-18 NOTE — Progress Notes (Deleted)
 Palliative Medicine [facility]  [phone] [phone]) [phone]   Name: Regina [name] Date: 11/18/2023 MRN: [id number]  DOB: 1965-11-14  Patient Care Team: [name], NP as PCP - General (Internal Medicine) Lytle Butte, RN as Oncology Nurse Navigator    REASON FOR CONSULTATION: Regina [name] is a 58 y.o. 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 Palliative ask to see for symptom management and goals of care.    SOCIAL HISTORY:     reports that she quit smoking about 4 years ago. Her smoking use included cigarettes. She has never used smokeless tobacco. She reports current alcohol use of about 6.0 standard drinks of alcohol per week. She reports current drug use. Frequency: 4.00 times per week. Drug: Marijuana.  ADVANCE DIRECTIVES:  Advanced directives on file name Regina [name] as healthcare agent should the patient become unable to speak for themself.   CODE STATUS: {Palliative Code status:23503}  PAST MEDICAL HISTORY: Past Medical History:  Diagnosis Date   Allergy    Anxiety    Brain mass    COPD (chronic obstructive pulmonary disease) (HCC)    Emphysema of lung (HCC)    Hypertension     PAST SURGICAL HISTORY:  Past Surgical History:  Procedure Laterality Date   APPLICATION OF CRANIAL NAVIGATION Left 10/07/2023   Procedure: APPLICATION OF CRANIAL NAVIGATION;  Surgeon: Coletta Memos, MD;  Location: [facility] OR;  Service: Neurosurgery;  Laterality: Left;   CRANIOTOMY Left 10/07/2023   Procedure: FRONTAL CRANIOTOMY FOR ABSCESS;  Surgeon: Coletta Memos, MD;  Location: [facility] OR;  Service: Neurosurgery;  Laterality: Left;   dental procedure     HUMERUS IM NAIL Right 10/10/2023   Procedure: INTRAMEDULLARY (IM) NAIL HUMERAL RIGHT;  Surgeon: Huel Cote, MD;   Location: [facility] OR;  Service: Orthopedics;  Laterality: Right;    HEMATOLOGY/ONCOLOGY HISTORY:  Oncology History  Primary adenocarcinoma of upper lobe of right lung (HCC)  09/22/2023 Initial Diagnosis   Primary adenocarcinoma of upper lobe of right lung (HCC)   09/22/2023 Cancer Staging   Staging form: Lung, AJCC V9 - Clinical stage from 09/22/2023: Stage IVB (cT1c, cN3, cM1c2) - Signed by Si Gaul, MD on 09/22/2023 Stage prefix: Initial diagnosis Method of lymph node assessment: Clinical   11/09/2023 -  Chemotherapy   Patient is on Treatment Plan : LUNG NSCLC Pembrolizumab (200) q21d       ALLERGIES:  is allergic to pork allergy and bee venom.  MEDICATIONS:  Current Outpatient Medications  Medication Sig Dispense Refill   acetaminophen (TYLENOL) 325 MG tablet Take 2 tablets (650 mg total) by mouth every 6 (six) hours as needed for mild pain (pain score 1-3) (or Fever >/= 101). 30 tablet 0   amLODipine (NORVASC) 5 MG tablet Take 1 tablet (5 mg total) by mouth daily. 90 tablet 1   cephALEXin (KEFLEX) 500 MG capsule Take 1 capsule (500 mg total) by mouth 4 (four) times daily for 5 days. 20 capsule 0   levETIRAcetam (KEPPRA) 500 MG tablet Take 1 tablet (500 mg total) by mouth 2 (two) times daily. 60 tablet 0   lidocaine-prilocaine (EMLA) cream Apply to affected area once 30 g 3   nutrition supplement, JUVEN, (JUVEN) PACK Take 1 packet by mouth 2 (two) times daily between meals. 60 packet 0   ondansetron (ZOFRAN) 8 MG tablet Take 1 tablet (  8 mg total) by mouth every 8 (eight) hours as needed for nausea or vomiting. 30 tablet 1   oxyCODONE (ROXICODONE) 5 MG immediate release tablet Take 1 tablet (5 mg total) by mouth every 4 (four) hours as needed for severe pain (pain score 7-10) or breakthrough pain. 15 tablet 0   prochlorperazine (COMPAZINE) 10 MG tablet Take 1 tablet (10 mg total) by mouth every 6 (six) hours as needed for nausea or vomiting. 30 tablet 1   No current  facility-administered medications for this visit.    VITAL SIGNS: LMP 06/06/2010  There were no vitals filed for this visit.  Estimated body mass index is 19.17 kg/m as calculated from the following:   Height as of 11/02/23: 5\' 6"  (1.676 m).   Weight as of 11/02/23: 118 lb 12.8 oz (53.9 kg).  LABS: CBC:    Component Value Date/Time   WBC 7.8 11/02/2023 0845   WBC 21.8 (H) 10/09/2023 0456   HGB 12.1 11/02/2023 0845   HGB 13.5 10/19/2017 0942   HCT 35.9 (L) 11/02/2023 0845   HCT 38.9 10/19/2017 0942   PLT 460 (H) 11/02/2023 0845   PLT 550 (H) 10/19/2017 0942   MCV 86.5 11/02/2023 0845   MCV 86 10/19/2017 0942   NEUTROABS 4.7 11/02/2023 0845   NEUTROABS 4.3 10/19/2017 0942   LYMPHSABS 2.1 11/02/2023 0845   LYMPHSABS 2.0 10/19/2017 0942   MONOABS 0.6 11/02/2023 0845   EOSABS 0.2 11/02/2023 0845   EOSABS 0.1 10/19/2017 0942   BASOSABS 0.1 11/02/2023 0845   BASOSABS 0.1 10/19/2017 0942   Comprehensive Metabolic Panel:    Component Value Date/Time   NA 138 11/02/2023 0845   NA 130 (L) 10/19/2017 0942   K 3.2 (L) 11/02/2023 0845   CL 102 11/02/2023 0845   CO2 30 11/02/2023 0845   BUN 5 (L) 11/02/2023 0845   BUN 4 (L) 10/19/2017 0942   CREATININE 0.51 11/02/2023 0845   GLUCOSE 106 (H) 11/02/2023 0845   CALCIUM 9.9 11/02/2023 0845   AST 16 11/02/2023 0845   ALT 13 11/02/2023 0845   ALKPHOS 110 11/02/2023 0845   BILITOT 0.3 11/02/2023 0845   PROT 6.9 11/02/2023 0845   PROT 7.6 10/19/2017 0942   ALBUMIN 3.9 11/02/2023 0845   ALBUMIN 4.6 10/19/2017 0942    RADIOGRAPHIC STUDIES: NM PET Image Initial (PI) Skull Base To Thigh (F-18 FDG) Result Date: 10/28/2023 CLINICAL DATA:  Initial treatment strategy for stage IV non-small cell lung cancer with pathologic right humerus fracture post fixation and with brain metastasis. EXAM: NUCLEAR MEDICINE PET SKULL BASE TO THIGH TECHNIQUE: 6.6 mCi F-18 FDG was injected intravenously. Full-ring PET imaging was performed from the skull  base to thigh after the radiotracer. CT data was obtained and used for attenuation correction and anatomic localization. Fasting blood glucose: 94 mg/dl COMPARISON:  16/06/9603 CT chest, abdomen and pelvis. FINDINGS: Mediastinal blood pool activity: SUV max 2.0 Liver activity: SUV max NA NECK: No hypermetabolic lymph nodes in the neck. Incidental CT findings: None. CHEST: Intensely hypermetabolic lobulated solid 2.6 x 2.0 cm peripheral apical right upper lobe pulmonary nodule with max SUV 13.6 (series 7/image 14). No additional hypermetabolic pulmonary findings. Low level hypermetabolism associated with faintly calcified paratracheal, AP window, subcarinal and bilateral hilar lymph nodes. Representative faintly calcified 1.4 cm right paratracheal node with max SUV 3.6 (series 4/image 56). Right hilar adenopathy with max SUV 3.1 and left hilar adenopathy with max SUV 2.8. Incidental CT findings: Trace layering right pleural effusion. Moderate  paraseptal and centrilobular emphysema with mild diffuse bronchial wall thickening. Patchy reticulation and apparent honeycombing at the lung bases. Atherosclerotic nonaneurysmal thoracic aorta. ABDOMEN/PELVIS: No abnormal hypermetabolic activity within the liver, pancreas, adrenal glands, or spleen. No hypermetabolic lymph nodes in the abdomen or pelvis. Incidental CT findings: Atherosclerotic nonaneurysmal abdominal aorta. Calcified top-normal size uterus with presumed small degenerative fibroids. SKELETON: Hypermetabolic medial lower left sacral lesion with max SUV 12.9, CT occult. Large hypermetabolic expansile lytic destructive right mid humerus shaft 7 cm lesion with max SUV 18.0 status post transfixation of pathologic right mid humeral shaft fracture with intramedullary rod with proximal and distal interlocking screws. Incidental CT findings: None. IMPRESSION: 1. Intensely hypermetabolic lobulated solid 2.6 cm peripheral apical right upper lobe pulmonary nodule,  compatible with primary bronchogenic malignancy. 2. Hypermetabolic large expansile lytic destructive right mid humerus shaft 7 cm bone metastasis status post transfixation of pathologic right mid humerus shaft fracture. 3. Hypermetabolic medial lower left sacral bone metastasis, CT occult. 4. Low level hypermetabolism associated with faintly calcified mediastinal and bilateral hilar lymph nodes, favoring sequela of prior granulomatous disease, likely sarcoidosis. 5. Trace layering right pleural effusion. 6. Aortic Atherosclerosis (ICD10-I70.0) and Emphysema (ICD10-J43.9). Electronically Signed   By: Delbert Phenix M.D.   On: 10/28/2023 11:24    PERFORMANCE STATUS (ECOG) : {CHL ONC ECOG XB:[id number]}  Review of Systems Unless otherwise noted, a complete review of systems is negative.  Physical Exam General: NAD Cardiovascular: regular rate and rhythm Pulmonary: clear ant fields Abdomen: soft, nontender, + bowel sounds Extremities: no edema, no joint deformities Skin: no rashes Neurological: Alert and oriented x3  IMPRESSION: *** I introduced myself, Elonda Giuliano RN, and Palliative's role in collaboration with the oncology team. Concept of Palliative Care was introduced as specialized medical care for people and their families living with serious illness.  It focuses on providing relief from the symptoms and stress of a serious illness.  The goal is to improve quality of life for both the patient and the family. Values and goals of care important to patient and family were attempted to be elicited.    We discussed *** current illness and what it means in the larger context of *** on-going co-morbidities. Natural disease trajectory and expectations were discussed.  I discussed the importance of continued conversation with family and their medical providers regarding overall plan of care and treatment options, ensuring decisions are within the context of the patients values and  GOCs.  PLAN: Established therapeutic relationship. Education provided on palliative's role in collaboration with their Oncology/Radiation team. I will plan to see patient back in 2-4 weeks in collaboration to other oncology appointments.    Patient expressed understanding and was in agreement with this plan. She also understands that She can call the clinic at any time with any questions, concerns, or complaints.   Thank you for your referral and allowing Palliative to assist in Mrs. Shulamit Bivens's care.   Number and complexity of problems addressed: ***HIGH - 1 or more chronic illnesses with SEVERE exacerbation, progression, or side effects of treatment - advanced cancer, pain. Any controlled substances utilized were prescribed in the context of palliative care.   Visit consisted of counseling and education dealing with the complex and emotionally intense issues of symptom management and palliative care in the setting of serious and potentially life-threatening illness.  Signed by: Willette Alma, AGPCNP-BC Palliative Medicine Team/[facility]

## 2023-11-21 ENCOUNTER — Encounter (HOSPITAL_COMMUNITY): Payer: Self-pay | Admitting: Emergency Medicine

## 2023-11-21 ENCOUNTER — Inpatient Hospital Stay: Payer: MEDICAID | Admitting: Nurse Practitioner

## 2023-11-21 ENCOUNTER — Other Ambulatory Visit: Payer: Self-pay

## 2023-11-21 ENCOUNTER — Emergency Department (HOSPITAL_BASED_OUTPATIENT_CLINIC_OR_DEPARTMENT_OTHER): Payer: MEDICAID

## 2023-11-21 ENCOUNTER — Emergency Department (HOSPITAL_COMMUNITY): Payer: MEDICAID

## 2023-11-21 ENCOUNTER — Emergency Department (HOSPITAL_COMMUNITY)
Admission: EM | Admit: 2023-11-21 | Discharge: 2023-11-21 | Discharge status: Against Medical Advice | Payer: MEDICAID | Attending: Emergency Medicine | Admitting: Emergency Medicine

## 2023-11-21 DIAGNOSIS — Z5329 Procedure and treatment not carried out because of patient's decision for other reasons: Secondary | ICD-10-CM | POA: Insufficient documentation

## 2023-11-21 DIAGNOSIS — I10 Essential (primary) hypertension: Secondary | ICD-10-CM | POA: Diagnosis not present

## 2023-11-21 DIAGNOSIS — R2231 Localized swelling, mass and lump, right upper limb: Secondary | ICD-10-CM | POA: Insufficient documentation

## 2023-11-21 DIAGNOSIS — M7989 Other specified soft tissue disorders: Secondary | ICD-10-CM

## 2023-11-21 DIAGNOSIS — Z79899 Other long term (current) drug therapy: Secondary | ICD-10-CM | POA: Diagnosis not present

## 2023-11-21 DIAGNOSIS — J449 Chronic obstructive pulmonary disease, unspecified: Secondary | ICD-10-CM | POA: Insufficient documentation

## 2023-11-21 DIAGNOSIS — M79601 Pain in right arm: Secondary | ICD-10-CM | POA: Diagnosis present

## 2023-11-21 LAB — COMPREHENSIVE METABOLIC PANEL
ALT: 20 U/L (ref 0–44)
AST: 39 U/L (ref 15–41)
Albumin: 3.7 g/dL (ref 3.5–5.0)
Alkaline Phosphatase: 95 U/L (ref 38–126)
Anion gap: 13 (ref 5–15)
BUN: <5 mg/dL — ABNORMAL LOW (ref 6–20)
CO2: 26 mmol/L (ref 22–32)
Calcium: 10.7 mg/dL — ABNORMAL HIGH (ref 8.9–10.3)
Chloride: 98 mmol/L (ref 98–111)
Creatinine, Ser: 0.64 mg/dL (ref 0.44–1.00)
GFR, Estimated: >60 mL/min (ref >60)
Glucose, Bld: 92 mg/dL (ref 70–99)
Potassium: 2.8 mmol/L — ABNORMAL LOW (ref 3.5–5.1)
Sodium: 137 mmol/L (ref 135–145)
Total Bilirubin: 0.4 mg/dL (ref 0.0–1.2)
Total Protein: 8 g/dL (ref 6.5–8.1)

## 2023-11-21 LAB — CBC WITH DIFFERENTIAL/PLATELET
Abs Immature Granulocytes: 0.06 10*3/uL (ref 0.00–0.07)
Basophils Absolute: 0.1 10*3/uL (ref 0.0–0.1)
Basophils Relative: 1 %
Eosinophils Absolute: 0.1 10*3/uL (ref 0.0–0.5)
Eosinophils Relative: 1 %
HCT: 40 % (ref 36.0–46.0)
Hemoglobin: 13.3 g/dL (ref 12.0–15.0)
Immature Granulocytes: 1 %
Lymphocytes Relative: 38 %
Lymphs Abs: 3.5 10*3/uL (ref 0.7–4.0)
MCH: 29 pg (ref 26.0–34.0)
MCHC: 33.3 g/dL (ref 30.0–36.0)
MCV: 87.3 fL (ref 80.0–100.0)
Monocytes Absolute: 1.1 10*3/uL — ABNORMAL HIGH (ref 0.1–1.0)
Monocytes Relative: 12 %
Neutro Abs: 4.5 10*3/uL (ref 1.7–7.7)
Neutrophils Relative %: 47 %
Platelets: 487 10*3/uL — ABNORMAL HIGH (ref 150–400)
RBC: 4.58 MIL/uL (ref 3.87–5.11)
RDW: 15.1 % (ref 11.5–15.5)
WBC: 9.4 10*3/uL (ref 4.0–10.5)
nRBC: 0 % (ref 0.0–0.2)

## 2023-11-21 LAB — LACTIC ACID, PLASMA: Lactic Acid, Venous: 2.2 mmol/L (ref 0.5–1.9)

## 2023-11-21 MED ORDER — MORPHINE SULFATE (PF) 4 MG/ML IV SOLN
4.0000 mg | Freq: Once | INTRAVENOUS | Status: AC
  Administered 2023-11-21: 4 mg via INTRAVENOUS
  Filled 2023-11-21: qty 1

## 2023-11-21 NOTE — ED Provider Notes (Addendum)
 I took over care pending consult with ortho at [facility].  I was notified that pt eloped from the ED without telling anyone. The PAL center did call back and said that patient was accepted to [facility].  However I advised and the patient had eloped.   Rolan Bucco, MD 11/21/23 [id number]    Rolan Bucco, MD 11/21/23 [phone]

## 2023-11-21 NOTE — ED Provider Notes (Signed)
 Regina [name] EMERGENCY DEPARTMENT AT [facility] Provider Note   CSN: [id number] Arrival date & time: 11/21/23  1257     History  Chief Complaint  Patient presents with   Arm Pain    Regina [name] is a 58 y.o. female.  The history is provided by the patient and medical records. No language interpreter was used.  Arm Pain This is a new problem. The current episode started more than 2 days ago. The problem occurs constantly. The problem has not changed since onset.Pertinent negatives include no chest pain, no abdominal pain, no headaches and no shortness of breath. Nothing aggravates the symptoms. Nothing relieves the symptoms. She has tried nothing for the symptoms. The treatment provided no relief.       Home Medications Prior to Admission medications   Medication Sig Start Date End Date Taking? Authorizing Provider  acetaminophen (TYLENOL) 325 MG tablet Take 2 tablets (650 mg total) by mouth every 6 (six) hours as needed for mild pain (pain score 1-3) (or Fever >/= 101). 09/20/23   Azucena Fallen, MD  amLODipine (NORVASC) 5 MG tablet Take 1 tablet (5 mg total) by mouth daily. 11/01/23   [name], NP  levETIRAcetam (KEPPRA) 500 MG tablet Take 1 tablet (500 mg total) by mouth 2 (two) times daily. 09/20/23   Azucena Fallen, MD  lidocaine-prilocaine (EMLA) cream Apply to affected area once 11/02/23   Si Gaul, MD  nutrition supplement, JUVEN, [facility]) PACK Take 1 packet by mouth 2 (two) times daily between meals. 10/14/23 11/13/23  Coletta Memos, MD  ondansetron (ZOFRAN) 8 MG tablet Take 1 tablet (8 mg total) by mouth every 8 (eight) hours as needed for nausea or vomiting. 11/02/23   Si Gaul, MD  oxyCODONE (ROXICODONE) 5 MG immediate release tablet Take 1 tablet (5 mg total) by mouth every 4 (four) hours as needed for severe pain (pain score 7-10) or breakthrough pain. 11/15/23   Amador Cunas, PA-C  prochlorperazine (COMPAZINE) 10 MG tablet  Take 1 tablet (10 mg total) by mouth every 6 (six) hours as needed for nausea or vomiting. 11/02/23   Si Gaul, MD      Allergies    Pork allergy and Bee venom    Review of Systems   Review of Systems  Constitutional:  Negative for chills, diaphoresis, fatigue and fever.  HENT:  Negative for congestion.   Eyes:  Negative for visual disturbance.  Respiratory:  Negative for cough, chest tightness, shortness of breath and wheezing.   Cardiovascular:  Negative for chest pain and palpitations.  Gastrointestinal:  Negative for abdominal pain, constipation, diarrhea, nausea and vomiting.  Genitourinary:  Negative for dysuria and flank pain.  Musculoskeletal:  Negative for back pain, neck pain and neck stiffness.  Skin:  Positive for color change.  Neurological:  Negative for dizziness, light-headedness and headaches.  Psychiatric/Behavioral:  Negative for agitation and confusion.     Physical Exam Updated Vital Signs BP 133/74 (BP Location: Left Arm)   Pulse 85   Temp 98.3 F (36.8 C) (Oral)   Resp 17   LMP 06/06/2010   SpO2 100%  Physical Exam Vitals and nursing note reviewed.  Constitutional:      General: She is not in acute distress.    Appearance: She is well-developed. She is not ill-appearing, toxic-appearing or diaphoretic.  HENT:     Head: Normocephalic and atraumatic.     Nose: No congestion or rhinorrhea.     Mouth/Throat:  Mouth: Mucous membranes are moist.     Pharynx: No oropharyngeal exudate or posterior oropharyngeal erythema.  Eyes:     Extraocular Movements: Extraocular movements intact.     Conjunctiva/sclera: Conjunctivae normal.     Pupils: Pupils are equal, round, and reactive to light.  Cardiovascular:     Rate and Rhythm: Normal rate and regular rhythm.     Pulses: Normal pulses.     Heart sounds: No murmur heard. Pulmonary:     Effort: Pulmonary effort is normal. No respiratory distress.     Breath sounds: Normal breath sounds. No  wheezing, rhonchi or rales.  Chest:     Chest wall: No tenderness.  Abdominal:     General: Abdomen is flat.     Palpations: Abdomen is soft.     Tenderness: There is no abdominal tenderness. There is no right CVA tenderness, left CVA tenderness, guarding or rebound.  Musculoskeletal:        General: Swelling and tenderness present.     Cervical back: Neck supple. No tenderness.  Skin:    General: Skin is warm and dry.     Capillary Refill: Capillary refill takes less than 2 seconds.     Findings: Erythema present. No rash.  Neurological:     General: No focal deficit present.     Mental Status: She is alert.     Sensory: No sensory deficit.     Motor: No weakness.  Psychiatric:        Mood and Affect: Mood normal.     ED Results / Procedures / Treatments   Labs (all labs ordered are listed, but only abnormal results are displayed) Labs Reviewed  CBC WITH DIFFERENTIAL/PLATELET - Abnormal; Notable for the following components:      Result Value   Platelets 487 (*)    Monocytes Absolute 1.1 (*)    All other components within normal limits  COMPREHENSIVE METABOLIC PANEL - Abnormal; Notable for the following components:   Potassium 2.8 (*)    BUN <5 (*)    Calcium 10.7 (*)    All other components within normal limits  LACTIC ACID, PLASMA - Abnormal; Notable for the following components:   Lactic Acid, Venous 2.2 (*)    All other components within normal limits  CULTURE, BLOOD (ROUTINE X 2)  CULTURE, BLOOD (ROUTINE X 2)  LACTIC ACID, PLASMA    EKG None  Radiology VAS [place] UPPER EXTREMITY VENOUS DUPLEX Result Date: 11/21/2023 UPPER VENOUS STUDY  Patient Name:  Regina [name]  Date of Exam:   11/21/2023 Medical Rec #: [id number]       Accession #:    [id number] Date of Birth: August 19, 1966       Patient Gender: F Patient Age:   6 years Exam Location:  [facility] Procedure:      VAS [place] UPPER EXTREMITY VENOUS DUPLEX Referring Phys: Lynden Oxford  --------------------------------------------------------------------------------  Indications: Pain, and Edema Risk Factors: Recent femur fracture with surgical repair (10/10/2023), recent suture removal (11/15/2023). Comparison Study: No previous exams Performing Technologist: Jody Hill RVT, RDMS  Examination Guidelines: A complete evaluation includes B-mode imaging, spectral Doppler, color Doppler, and power Doppler as needed of all accessible portions of each vessel. Bilateral testing is considered an integral part of a complete examination. Limited examinations for reoccurring indications may be performed as noted.  Right Findings: +----------+------------+---------+-----------+----------+--------------------+ RIGHT     CompressiblePhasicitySpontaneousProperties      Summary        +----------+------------+---------+-----------+----------+--------------------+ IJV  Full       No        Yes    pulsatile                      +----------+------------+---------+-----------+----------+--------------------+ Subclavian    Full       No        Yes    pulsatile                      +----------+------------+---------+-----------+----------+--------------------+ Axillary      Full       No        Yes    pulsatile                      +----------+------------+---------+-----------+----------+--------------------+ Brachial                 Yes       Yes                   patent by                                                              color/doppler     +----------+------------+---------+-----------+----------+--------------------+ Radial        Full                                                       +----------+------------+---------+-----------+----------+--------------------+ Ulnar                                                  Not visualized    +----------+------------+---------+-----------+----------+--------------------+ Cephalic      Full                                                        +----------+------------+---------+-----------+----------+--------------------+ Basilic                                                Not visualized    +----------+------------+---------+-----------+----------+--------------------+ Unable to visualized basilic or ulnar veins due to patient inability to move arm and edema. Limited visualization of cephalic and brachial due to immobility and edema, areas visualized appear patent.  Left Findings: +----+------------+---------+-----------+----------+-----------------------+ LEFTCompressiblePhasicitySpontaneousProperties        Summary         +----+------------+---------+-----------+----------+-----------------------+ IJV                Yes       Yes              patent by color/doppler +----+------------+---------+-----------+----------+-----------------------+  Summary:  Right: No evidence of deep vein thrombosis in the upper extremity in areas visualized. No evidence of superficial  vein thrombosis in the upper extremity in areas visualized. However, unable to visualize the basilic and ulnar veins.  Left: No evidence of thrombosis in the subclavian.  *See table(s) above for measurements and observations.    Preliminary    DG Shoulder Right Result Date: 11/21/2023 CLINICAL DATA:  Right arm pain and swelling. EXAM: RIGHT HUMERUS - 2+ VIEW; RIGHT SHOULDER - 2+ VIEW COMPARISON:  None Available. FINDINGS: No acute fracture or dislocation. Right humerus ORIF noted fixated with 2 threaded screw proximally and 1 threaded screw distally. The hardware is intact. No periprosthetic fracture or lucency. However, there is permeative pattern in the humerus in the middle third shaft and there are large areas of bone destruction surrounding the hardware. Glenohumeral and acromioclavicular joints are normal in alignment and exhibit minimal degenerative changes. No soft tissue swelling. No radiopaque foreign  bodies. IMPRESSION: *No acute fracture or dislocation. *Permeative pattern in the humerus in the middle third shaft and large areas of bone destruction surrounding the hardware. Findings are compatible with metastatic disease. Electronically Signed   By: Jules Schick M.D.   On: 11/21/2023 15:51   DG Humerus Right Result Date: 11/21/2023 CLINICAL DATA:  Right arm pain and swelling. EXAM: RIGHT HUMERUS - 2+ VIEW; RIGHT SHOULDER - 2+ VIEW COMPARISON:  None Available. FINDINGS: No acute fracture or dislocation. Right humerus ORIF noted fixated with 2 threaded screw proximally and 1 threaded screw distally. The hardware is intact. No periprosthetic fracture or lucency. However, there is permeative pattern in the humerus in the middle third shaft and there are large areas of bone destruction surrounding the hardware. Glenohumeral and acromioclavicular joints are normal in alignment and exhibit minimal degenerative changes. No soft tissue swelling. No radiopaque foreign bodies. IMPRESSION: *No acute fracture or dislocation. *Permeative pattern in the humerus in the middle third shaft and large areas of bone destruction surrounding the hardware. Findings are compatible with metastatic disease. Electronically Signed   By: Jules Schick M.D.   On: 11/21/2023 15:51    Procedures Procedures    Medications Ordered in ED Medications  morphine (PF) 4 MG/ML injection 4 mg (4 mg Intravenous Given 11/21/23 1442)    ED Course/ Medical Decision Making/ A&P                                 Medical Decision Making Amount and/or Complexity of Data Reviewed Labs: ordered. Radiology: ordered.  Risk Prescription drug management.    Lorijean Husser is a 58 y.o. 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   According to patient, she had her sutures removed last Thursday, 5 days ago, and since then has had rapidly worsening swelling and pain in the right shoulder and arm.  She reports she is right-handed.  The pain is 10 out of 10.  She is denying fevers chills, congestion, or cough.  Reports no numbness or weakness but cannot move her arm well due to the pain.  She denies any nausea, vomiting, constipation, diarrhea, or urinary changes.  Denies history of blood clots.  On exam, patient has swollen right arm from the shoulder down to the hand.  She does have intact arterial pulses in the arm.  Intact sensation and strength.  Patient tenderness and near the  entirety of the right arm especially around the shoulder.  There was no purulence coming from the healed wounds although there was some swelling and tenderness.  There was some erythema.  Lungs were clear and chest was nontender.  Otherwise she denied any symptoms in her other arm or legs.  Will attempt a call orthopedics to discuss workup but will start with x-ray to look for new pathologic fracture and will order ultrasound to look for DVT.  Will give her some pain medicine and get labs.  Also concerned about infection or hardware problem.  Patient may need more advanced imaging but will speak to orthopedics first.  2:38 PM X-rays were obtained and I spoke to the patient surgeon, [name].  He looked at the images and feel the patient's distal screw is backing out due to progression of the cancer.  He suspects this pain and swelling is acutely worsening in the setting of her worsening disease.  He agrees with getting DVT ultrasound and labs and pain control however he says that this is so complex it will need to be transferred to [facility] for multispecialty management with likely radiation oncology, oncology, and orthopedics.  Will discuss with patient and get the labs and ultrasound to return but anticipate she will likely need transfer based on  orthopedic recommendations.  3:54 PM Ultrasound returned and was negative for blood clot.  Official x-ray returned showing evidence of bony destruction around the hardware with metastatic disease.  Otherwise no acute fracture or dislocation seen.  I want to discussed with patient the findings and that the orthopedics team here recommends we talked to [facility] to discuss with the orthopedic oncology service for further management.  I am worried about the patient being discharged and trying to get established herself given the amount of pain she is having.  Will speak to [facility] about possible transfer versus how to get established there for further management.          Final Clinical Impression(s) / ED Diagnoses Final diagnoses:  Right arm pain  Redness and swelling of upper arm     Clinical Impression: 1. Right arm pain   2. Redness and swelling of upper arm     Disposition: Care transferred to oncoming team to await discussion with [facility] about possible transfer versus close follow-up.  This note was prepared with assistance of Conservation officer, historic buildings. Occasional wrong-word or sound-a-like substitutions may have occurred due to the inherent limitations of voice recognition software.     Lanai Conlee, Canary Brim, MD 11/21/23 [id number]

## 2023-11-21 NOTE — ED Triage Notes (Addendum)
 Patient present due to right arm swelling. She is reports having sutures removed Wednesday. Swelling began post removal. Arm is painful and patient has kept it in a sling. Pitting edema noted.

## 2023-11-22 ENCOUNTER — Other Ambulatory Visit: Payer: Self-pay

## 2023-11-22 ENCOUNTER — Encounter (HOSPITAL_COMMUNITY): Payer: Self-pay | Admitting: Internal Medicine

## 2023-11-22 ENCOUNTER — Telehealth (HOSPITAL_BASED_OUTPATIENT_CLINIC_OR_DEPARTMENT_OTHER): Payer: MEDICAID | Admitting: Nurse Practitioner

## 2023-11-22 ENCOUNTER — Inpatient Hospital Stay (HOSPITAL_COMMUNITY)
Admission: EM | Admit: 2023-11-22 | Discharge: 2023-11-25 | DRG: 559 | Disposition: A | Discharge status: Home / Self Care | Payer: MEDICAID | Attending: Family Medicine | Admitting: Family Medicine

## 2023-11-22 DIAGNOSIS — M898X2 Other specified disorders of bone, upper arm: Secondary | ICD-10-CM | POA: Diagnosis present

## 2023-11-22 DIAGNOSIS — Z91198 Patient's noncompliance with other medical treatment and regimen for other reason: Secondary | ICD-10-CM

## 2023-11-22 DIAGNOSIS — E871 Hypo-osmolality and hyponatremia: Secondary | ICD-10-CM | POA: Diagnosis present

## 2023-11-22 DIAGNOSIS — J42 Unspecified chronic bronchitis: Secondary | ICD-10-CM

## 2023-11-22 DIAGNOSIS — R591 Generalized enlarged lymph nodes: Secondary | ICD-10-CM | POA: Diagnosis present

## 2023-11-22 DIAGNOSIS — Y838 Other surgical procedures as the cause of abnormal reaction of the patient, or of later complication, without mention of misadventure at the time of the procedure: Secondary | ICD-10-CM | POA: Diagnosis present

## 2023-11-22 DIAGNOSIS — Z681 Body mass index (BMI) 19 or less, adult: Secondary | ICD-10-CM

## 2023-11-22 DIAGNOSIS — Z87891 Personal history of nicotine dependence: Secondary | ICD-10-CM

## 2023-11-22 DIAGNOSIS — Z91030 Bee allergy status: Secondary | ICD-10-CM

## 2023-11-22 DIAGNOSIS — J439 Emphysema, unspecified: Secondary | ICD-10-CM | POA: Diagnosis present

## 2023-11-22 DIAGNOSIS — F419 Anxiety disorder, unspecified: Secondary | ICD-10-CM | POA: Diagnosis present

## 2023-11-22 DIAGNOSIS — I10 Essential (primary) hypertension: Secondary | ICD-10-CM | POA: Diagnosis present

## 2023-11-22 DIAGNOSIS — E876 Hypokalemia: Secondary | ICD-10-CM | POA: Diagnosis present

## 2023-11-22 DIAGNOSIS — T8489XA Other specified complication of internal orthopedic prosthetic devices, implants and grafts, initial encounter: Secondary | ICD-10-CM | POA: Diagnosis present

## 2023-11-22 DIAGNOSIS — Z91014 Allergy to mammalian meats: Secondary | ICD-10-CM | POA: Diagnosis not present

## 2023-11-22 DIAGNOSIS — Z7189 Other specified counseling: Secondary | ICD-10-CM | POA: Diagnosis not present

## 2023-11-22 DIAGNOSIS — Z9889 Other specified postprocedural states: Secondary | ICD-10-CM

## 2023-11-22 DIAGNOSIS — G893 Neoplasm related pain (acute) (chronic): Secondary | ICD-10-CM | POA: Diagnosis present

## 2023-11-22 DIAGNOSIS — Z806 Family history of leukemia: Secondary | ICD-10-CM

## 2023-11-22 DIAGNOSIS — R52 Pain, unspecified: Secondary | ICD-10-CM | POA: Diagnosis not present

## 2023-11-22 DIAGNOSIS — Z79899 Other long term (current) drug therapy: Secondary | ICD-10-CM | POA: Diagnosis not present

## 2023-11-22 DIAGNOSIS — C3490 Malignant neoplasm of unspecified part of unspecified bronchus or lung: Secondary | ICD-10-CM | POA: Diagnosis not present

## 2023-11-22 DIAGNOSIS — C7951 Secondary malignant neoplasm of bone: Secondary | ICD-10-CM | POA: Diagnosis present

## 2023-11-22 DIAGNOSIS — Y792 Prosthetic and other implants, materials and accessory orthopedic devices associated with adverse incidents: Secondary | ICD-10-CM | POA: Diagnosis present

## 2023-11-22 DIAGNOSIS — C3411 Malignant neoplasm of upper lobe, right bronchus or lung: Secondary | ICD-10-CM | POA: Diagnosis present

## 2023-11-22 DIAGNOSIS — C7931 Secondary malignant neoplasm of brain: Secondary | ICD-10-CM | POA: Diagnosis present

## 2023-11-22 DIAGNOSIS — E43 Unspecified severe protein-calorie malnutrition: Secondary | ICD-10-CM | POA: Diagnosis present

## 2023-11-22 DIAGNOSIS — M8440XA Pathological fracture, unspecified site, initial encounter for fracture: Secondary | ICD-10-CM | POA: Diagnosis present

## 2023-11-22 DIAGNOSIS — T8484XA Pain due to internal orthopedic prosthetic devices, implants and grafts, initial encounter: Secondary | ICD-10-CM | POA: Diagnosis present

## 2023-11-22 DIAGNOSIS — Z515 Encounter for palliative care: Secondary | ICD-10-CM

## 2023-11-22 DIAGNOSIS — Z8249 Family history of ischemic heart disease and other diseases of the circulatory system: Secondary | ICD-10-CM | POA: Diagnosis not present

## 2023-11-22 DIAGNOSIS — J449 Chronic obstructive pulmonary disease, unspecified: Secondary | ICD-10-CM | POA: Diagnosis present

## 2023-11-22 DIAGNOSIS — M84521A Pathological fracture in neoplastic disease, right humerus, initial encounter for fracture: Secondary | ICD-10-CM | POA: Diagnosis present

## 2023-11-22 LAB — RETICULOCYTES
Immature Retic Fract: 13.2 % (ref 2.3–15.9)
RBC.: 4.18 MIL/uL (ref 3.87–5.11)
Retic Count, Absolute: 88.2 10*3/uL (ref 19.0–186.0)
Retic Ct Pct: 2.1 % (ref 0.4–3.1)

## 2023-11-22 LAB — BASIC METABOLIC PANEL
Anion gap: 12 (ref 5–15)
BUN: 7 mg/dL (ref 6–20)
CO2: 26 mmol/L (ref 22–32)
Calcium: 9.9 mg/dL (ref 8.9–10.3)
Chloride: 93 mmol/L — ABNORMAL LOW (ref 98–111)
Creatinine, Ser: 0.66 mg/dL (ref 0.44–1.00)
GFR, Estimated: >60 mL/min (ref >60)
Glucose, Bld: 103 mg/dL — ABNORMAL HIGH (ref 70–99)
Potassium: 2.7 mmol/L — CL (ref 3.5–5.1)
Sodium: 131 mmol/L — ABNORMAL LOW (ref 135–145)

## 2023-11-22 LAB — CBC WITH DIFFERENTIAL/PLATELET
Abs Immature Granulocytes: 0.06 10*3/uL (ref 0.00–0.07)
Basophils Absolute: 0.1 10*3/uL (ref 0.0–0.1)
Basophils Relative: 1 %
Eosinophils Absolute: 0.4 10*3/uL (ref 0.0–0.5)
Eosinophils Relative: 3 %
HCT: 35.3 % — ABNORMAL LOW (ref 36.0–46.0)
Hemoglobin: 11.8 g/dL — ABNORMAL LOW (ref 12.0–15.0)
Immature Granulocytes: 1 %
Lymphocytes Relative: 45 %
Lymphs Abs: 4.7 10*3/uL — ABNORMAL HIGH (ref 0.7–4.0)
MCH: 28.8 pg (ref 26.0–34.0)
MCHC: 33.4 g/dL (ref 30.0–36.0)
MCV: 86.1 fL (ref 80.0–100.0)
Monocytes Absolute: 1 10*3/uL (ref 0.1–1.0)
Monocytes Relative: 10 %
Neutro Abs: 4.2 10*3/uL (ref 1.7–7.7)
Neutrophils Relative %: 40 %
Platelets: 492 10*3/uL — ABNORMAL HIGH (ref 150–400)
RBC: 4.1 MIL/uL (ref 3.87–5.11)
RDW: 15.2 % (ref 11.5–15.5)
Smear Review: NORMAL
WBC Morphology: >10
WBC: 10.4 10*3/uL (ref 4.0–10.5)
nRBC: 0 % (ref 0.0–0.2)

## 2023-11-22 LAB — PROTIME-INR
INR: 1 (ref 0.8–1.2)
Prothrombin Time: 13.4 s (ref 11.4–15.2)

## 2023-11-22 LAB — CK: Total CK: 180 U/L (ref 38–234)

## 2023-11-22 LAB — IRON AND TIBC
Iron: 50 ug/dL (ref 28–170)
Saturation Ratios: 16 % (ref 10.4–31.8)
TIBC: 307 ug/dL (ref 250–450)
UIBC: 257 ug/dL

## 2023-11-22 LAB — FERRITIN: Ferritin: 95 ng/mL (ref 11–307)

## 2023-11-22 LAB — PHOSPHORUS: Phosphorus: 3.4 mg/dL (ref 2.5–4.6)

## 2023-11-22 LAB — MAGNESIUM: Magnesium: 1.7 mg/dL (ref 1.7–2.4)

## 2023-11-22 MED ORDER — MAGNESIUM SULFATE IN D5W 1-5 GM/100ML-% IV SOLN
1.0000 g | Freq: Once | INTRAVENOUS | Status: AC
  Administered 2023-11-22: 1 g via INTRAVENOUS
  Filled 2023-11-22: qty 100

## 2023-11-22 MED ORDER — SODIUM CHLORIDE 0.9 % IV SOLN
INTRAVENOUS | Status: AC
Start: 2023-11-22 — End: 2023-11-23

## 2023-11-22 MED ORDER — FENTANYL CITRATE PF 50 MCG/ML IJ SOSY
50.0000 ug | PREFILLED_SYRINGE | Freq: Once | INTRAMUSCULAR | Status: AC
  Administered 2023-11-22: 50 ug via INTRAVENOUS
  Filled 2023-11-22: qty 1

## 2023-11-22 MED ORDER — ONDANSETRON HCL 4 MG PO TABS: 4.0000 mg | ORAL_TABLET | Freq: Four times a day (QID) | ORAL | Status: DC | PRN

## 2023-11-22 MED ORDER — AMLODIPINE BESYLATE 5 MG PO TABS
5.0000 mg | ORAL_TABLET | Freq: Every day | ORAL | Status: DC
  Administered 2023-11-23 – 2023-11-25 (×3): 5 mg via ORAL
  Filled 2023-11-22 (×3): qty 1

## 2023-11-22 MED ORDER — OXYCODONE HCL 5 MG PO TABS
5.0000 mg | ORAL_TABLET | ORAL | Status: DC | PRN
  Administered 2023-11-23 – 2023-11-25 (×8): 5 mg via ORAL
  Filled 2023-11-22 (×8): qty 1

## 2023-11-22 MED ORDER — ACETAMINOPHEN 325 MG PO TABS: 650.0000 mg | ORAL_TABLET | Freq: Four times a day (QID) | ORAL | Status: DC | PRN

## 2023-11-22 MED ORDER — ONDANSETRON HCL 4 MG/2ML IJ SOLN
4.0000 mg | Freq: Four times a day (QID) | INTRAMUSCULAR | Status: DC | PRN
  Administered 2023-11-22 – 2023-11-25 (×3): 4 mg via INTRAVENOUS
  Filled 2023-11-22 (×3): qty 2

## 2023-11-22 MED ORDER — ACETAMINOPHEN 650 MG RE SUPP: 650.0000 mg | Freq: Four times a day (QID) | RECTAL | Status: DC | PRN

## 2023-11-22 MED ORDER — OXYCODONE-ACETAMINOPHEN 5-325 MG PO TABS
2.0000 | ORAL_TABLET | Freq: Once | ORAL | Status: AC
  Administered 2023-11-22: 2 via ORAL
  Filled 2023-11-22: qty 2

## 2023-11-22 MED ORDER — HYDROMORPHONE HCL 1 MG/ML IJ SOLN
0.5000 mg | INTRAMUSCULAR | Status: DC | PRN
  Administered 2023-11-22 – 2023-11-25 (×5): 0.5 mg via INTRAVENOUS
  Filled 2023-11-22 (×5): qty 0.5

## 2023-11-22 MED ORDER — POTASSIUM CHLORIDE CRYS ER 20 MEQ PO TBCR
40.0000 meq | EXTENDED_RELEASE_TABLET | Freq: Once | ORAL | Status: AC
  Administered 2023-11-22: 40 meq via ORAL
  Filled 2023-11-22: qty 2

## 2023-11-22 MED ORDER — POTASSIUM CHLORIDE 10 MEQ/100ML IV SOLN
10.0000 meq | INTRAVENOUS | Status: AC
  Administered 2023-11-22 (×2): 10 meq via INTRAVENOUS
  Filled 2023-11-22: qty 100

## 2023-11-22 MED ORDER — HYDROCODONE-ACETAMINOPHEN 5-325 MG PO TABS: 1.0000 | ORAL_TABLET | ORAL | Status: DC | PRN

## 2023-11-22 MED ORDER — LEVETIRACETAM 500 MG PO TABS: 500.0000 mg | ORAL_TABLET | Freq: Two times a day (BID) | ORAL | Status: DC

## 2023-11-22 NOTE — ED Triage Notes (Signed)
 Patient arrives pov. Patient eloped yesterday due to family situation, however needed to be transferred to [facility] due to right arm injury

## 2023-11-22 NOTE — ED Notes (Signed)
 ED TO INPATIENT HANDOFF REPORT  Name/Age/Gender Regina [name] 58 y.o. female  Code Status Code Status History     Date Active Date Inactive Code Status Order ID Comments User Context   10/01/2023 2235 10/14/2023 0046 Full Code [id number]  Gery Pray, MD ED   09/17/2023 1938 09/20/2023 2053 Full Code [id number]  Briscoe Deutscher, MD ED   01/30/2020 1157 01/30/2020 2002 Full Code [id number]  Arthor Captain, PA-C ED   04/16/2018 0009 04/16/2018 1750 Full Code [id number]  Lorretta Harp, MD ED    Questions for Most Recent Historical Code Status (Order [id number])     Question Answer   By: Consent: discussion documented in EHR            Home/SNF/Other Home  Chief Complaint Pathologic fracture [M84.40XA]  Level of Care/Admitting Diagnosis ED Disposition     ED Disposition  Admit   Condition  --   Comment  Hospital Area: [facility] [[id number]]  Level of Care: Telemetry [5]  Admit to tele based on following criteria: Other see comments  Comments: hypokalemia  May admit patient to [facility] or [facility] if equivalent level of care is available:: No  Covid Evaluation: Asymptomatic - no recent exposure (last 10 days) testing not required  Diagnosis: Pathologic fracture [244010]  Admitting Physician: [name] [[id number]]  Attending Physician: [name] [[id number]]  Certification:: I certify this patient will need inpatient services for at least 2 midnights  Expected Medical Readiness: 11/25/2023          Medical History Past Medical History:  Diagnosis Date   Allergy    Anxiety    Brain mass    COPD (chronic obstructive pulmonary disease) (HCC)    Emphysema of lung (HCC)    Hypertension     Allergies Allergies  Allergen Reactions   Pork Allergy Nausea And Vomiting   Bee Venom Hives    IV Location/Drains/Wounds Patient Lines/Drains/Airways Status     Active Line/Drains/Airways     Name Placement date Placement time Site Days    Peripheral IV 11/22/23 20 G 1" Anterior;Distal;Left Forearm 11/22/23  1641  Forearm  less than 1   Pressure Injury 10/08/23 Coccyx Stage 2 -  Partial thickness loss of dermis presenting as a shallow open injury with a red, pink wound bed without slough. 10/08/23  1650  -- 45            Labs/Imaging Results for orders placed or performed during the hospital encounter of 11/22/23 (from the past 48 hours)  Basic metabolic panel     Status: Abnormal   Collection Time: 11/22/23  4:35 PM  Result Value Ref Range   Sodium 131 (L) 135 - 145 mmol/L   Potassium 2.7 (LL) 3.5 - 5.1 mmol/L    Comment: CRITICAL RESULT CALLED TO, READ BACK BY AND VERIFIED WITH P.DOWD, RN AT 1810 ON 03.18.25 BY N.THOMPSON    Chloride 93 (L) 98 - 111 mmol/L   CO2 26 22 - 32 mmol/L   Glucose, Bld 103 (H) 70 - 99 mg/dL    Comment: Glucose reference range applies only to samples taken after fasting for at least 8 hours.   BUN 7 6 - 20 mg/dL   Creatinine, Ser 2.72 0.44 - 1.00 mg/dL   Calcium 9.9 8.9 - 53.6 mg/dL   GFR, Estimated >64 >40 mL/min    Comment: (NOTE) Calculated using the CKD-EPI Creatinine Equation (2021)    Anion gap 12 5 - 15  Comment: Performed at [facility], [address] [place], [place] [address]  CBC with Differential     Status: Abnormal   Collection Time: 11/22/23  4:35 PM  Result Value Ref Range   WBC 10.4 4.0 - 10.5 K/uL   RBC 4.10 3.87 - 5.11 MIL/uL   Hemoglobin 11.8 (L) 12.0 - 15.0 g/dL   HCT 84.6 (L) 96.2 - 95.2 %   MCV 86.1 80.0 - 100.0 fL   MCH 28.8 26.0 - 34.0 pg   MCHC 33.4 30.0 - 36.0 g/dL   RDW 84.1 32.4 - 40.1 %   Platelets 492 (H) 150 - 400 K/uL   nRBC 0.0 0.0 - 0.2 %   Neutrophils Relative % 40 %   Neutro Abs 4.2 1.7 - 7.7 K/uL   Lymphocytes Relative 45 %   Lymphs Abs 4.7 (H) 0.7 - 4.0 K/uL   Monocytes Relative 10 %   Monocytes Absolute 1.0 0.1 - 1.0 K/uL   Eosinophils Relative 3 %   Eosinophils Absolute 0.4 0.0 - 0.5 K/uL   Basophils Relative  1 %   Basophils Absolute 0.1 0.0 - 0.1 K/uL   WBC Morphology >10% Reactive Benign Lymphoctyes     Comment: SMUDGE CELLS VACUOLATED NEUTROPHILS    Smear Review Normal platelet morphology    Immature Granulocytes 1 %   Abs Immature Granulocytes 0.06 0.00 - 0.07 K/uL   Target Cells PRESENT     Comment: Performed at [facility], [address], [place], [place] [address]  Magnesium     Status: None   Collection Time: 11/22/23  4:35 PM  Result Value Ref Range   Magnesium 1.7 1.7 - 2.4 mg/dL    Comment: Performed at [facility], [address], [place], [place] [address]   VAS [place] UPPER EXTREMITY VENOUS DUPLEX Result Date: 11/21/2023 UPPER VENOUS STUDY  Patient Name:  Regina [name]  Date of Exam:   11/21/2023 Medical Rec #: [id number]       Accession #:    [id number] Date of Birth: 1965/09/15       Patient Gender: F Patient Age:   64 years Exam Location:  [facility] Procedure:      VAS [place] UPPER EXTREMITY VENOUS DUPLEX Referring Phys: Lynden Oxford --------------------------------------------------------------------------------  Indications: Pain, and Edema Risk Factors: Recent femur fracture with surgical repair (10/10/2023), recent suture removal (11/15/2023). Comparison Study: No previous exams Performing Technologist: Jody Hill RVT, RDMS  Examination Guidelines: A complete evaluation includes B-mode imaging, spectral Doppler, color Doppler, and power Doppler as needed of all accessible portions of each vessel. Bilateral testing is considered an integral part of a complete examination. Limited examinations for reoccurring indications may be performed as noted.  Right Findings: +----------+------------+---------+-----------+----------+--------------------+ RIGHT     CompressiblePhasicitySpontaneousProperties      Summary        +----------+------------+---------+-----------+----------+--------------------+ IJV           Full       No         Yes    pulsatile                      +----------+------------+---------+-----------+----------+--------------------+ Subclavian    Full       No        Yes    pulsatile                      +----------+------------+---------+-----------+----------+--------------------+ Axillary      Full  No        Yes    pulsatile                      +----------+------------+---------+-----------+----------+--------------------+ Brachial                 Yes       Yes                   patent by                                                              color/doppler     +----------+------------+---------+-----------+----------+--------------------+ Radial        Full                                                       +----------+------------+---------+-----------+----------+--------------------+ Ulnar                                                  Not visualized    +----------+------------+---------+-----------+----------+--------------------+ Cephalic      Full                                                       +----------+------------+---------+-----------+----------+--------------------+ Basilic                                                Not visualized    +----------+------------+---------+-----------+----------+--------------------+ Unable to visualized basilic or ulnar veins due to patient inability to move arm and edema. Limited visualization of cephalic and brachial due to immobility and edema, areas visualized appear patent.  Left Findings: +----+------------+---------+-----------+----------+-----------------------+ LEFTCompressiblePhasicitySpontaneousProperties        Summary         +----+------------+---------+-----------+----------+-----------------------+ IJV                Yes       Yes              patent by color/doppler +----+------------+---------+-----------+----------+-----------------------+  Summary:  Right: No  evidence of deep vein thrombosis in the upper extremity in areas visualized. No evidence of superficial vein thrombosis in the upper extremity in areas visualized. However, unable to visualize the basilic and ulnar veins.  Left: No evidence of thrombosis in the subclavian.  *See table(s) above for measurements and observations.  Diagnosing physician: Gerarda Fraction Electronically signed by Gerarda Fraction on 11/21/2023 at 5:31:48 PM.    Final    DG Shoulder Right Result Date: 11/21/2023 CLINICAL DATA:  Right arm pain and swelling. EXAM: RIGHT HUMERUS - 2+ VIEW; RIGHT SHOULDER - 2+ VIEW COMPARISON:  None Available. FINDINGS: No acute fracture or dislocation. Right humerus ORIF noted fixated with 2 threaded screw  proximally and 1 threaded screw distally. The hardware is intact. No periprosthetic fracture or lucency. However, there is permeative pattern in the humerus in the middle third shaft and there are large areas of bone destruction surrounding the hardware. Glenohumeral and acromioclavicular joints are normal in alignment and exhibit minimal degenerative changes. No soft tissue swelling. No radiopaque foreign bodies. IMPRESSION: *No acute fracture or dislocation. *Permeative pattern in the humerus in the middle third shaft and large areas of bone destruction surrounding the hardware. Findings are compatible with metastatic disease. Electronically Signed   By: Jules Schick M.D.   On: 11/21/2023 15:51   DG Humerus Right Result Date: 11/21/2023 CLINICAL DATA:  Right arm pain and swelling. EXAM: RIGHT HUMERUS - 2+ VIEW; RIGHT SHOULDER - 2+ VIEW COMPARISON:  None Available. FINDINGS: No acute fracture or dislocation. Right humerus ORIF noted fixated with 2 threaded screw proximally and 1 threaded screw distally. The hardware is intact. No periprosthetic fracture or lucency. However, there is permeative pattern in the humerus in the middle third shaft and there are large areas of bone destruction surrounding the  hardware. Glenohumeral and acromioclavicular joints are normal in alignment and exhibit minimal degenerative changes. No soft tissue swelling. No radiopaque foreign bodies. IMPRESSION: *No acute fracture or dislocation. *Permeative pattern in the humerus in the middle third shaft and large areas of bone destruction surrounding the hardware. Findings are compatible with metastatic disease. Electronically Signed   By: Jules Schick M.D.   On: 11/21/2023 15:51    Pending Labs Unresulted Labs (From admission, onward)     Start     Ordered   11/23/23 0500  Vitamin B12  (Anemia Panel (PNL))  Tomorrow morning,   R        11/22/23 1944   11/23/23 0500  Folate  (Anemia Panel (PNL))  Tomorrow morning,   R        11/22/23 1944   11/23/23 0500  Prealbumin  Tomorrow morning,   R        11/22/23 1944   11/22/23 1945  Iron and TIBC  (Anemia Panel (PNL))  Add-on,   AD        11/22/23 1944   11/22/23 1945  Ferritin  (Anemia Panel (PNL))  Add-on,   AD        11/22/23 1944   11/22/23 1945  Reticulocytes  (Anemia Panel (PNL))  Add-on,   AD        11/22/23 1944   11/22/23 1945  CK  Add-on,   AD        11/22/23 1944   11/22/23 1945  Protime-INR  Once,   R       Question:  Release to patient  Answer:  Immediate   11/22/23 1944   11/22/23 1854  Phosphorus  Once,   STAT        11/22/23 1854            Vitals/Pain Today's Vitals   11/22/23 1623 11/22/23 1649 11/22/23 1853 11/22/23 1900  BP:  134/71  123/86  Pulse:  78  70  Resp:  14  16  Temp:    97.8 F (36.6 C)  TempSrc:    Oral  SpO2:  100%  97%  Weight:      Height:      PainSc: 8   8      Isolation Precautions No active isolations  Medications Medications  potassium chloride 10 mEq in 100 mL IVPB (10 mEq Intravenous New  Bag/Given 11/22/23 1851)  magnesium sulfate IVPB 1 g 100 mL (has no administration in time range)  oxyCODONE-acetaminophen (PERCOCET/ROXICET) 5-325 MG per tablet 2 tablet (2 tablets Oral Given 11/22/23 1623)   potassium chloride SA (KLOR-CON M) CR tablet 40 mEq (40 mEq Oral Given 11/22/23 1837)  fentaNYL (SUBLIMAZE) injection 50 mcg (50 mcg Intravenous Given 11/22/23 1853)    Mobility walks with person assist

## 2023-11-22 NOTE — ED Provider Notes (Signed)
 [place] EMERGENCY DEPARTMENT AT [facility] Provider Note   CSN: [id number] Arrival date & time: 11/22/23  1300     History {Add pertinent medical, surgical, social history, OB history to HPI:1} Chief Complaint  Patient presents with   Arm Injury    Regina [name] is a 58 y.o. female who presents emergency department for right arm pain.  She was seen yesterday for the same.  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  She was worked up yesterday and found to have significantly worsening swelling of the right arm with severe pain.  showed worsening progression of her cancer with loosening of the hardware secondary to pathology in the bone structure.  [name] was consulted yesterday and the patient was noted to need tertiary care for further workup due to her continued severe pain and complexity of her case.  He was accepted by [facility] in transfer yesterday however left from the emergency department and eloped due to "a family emergency."  And re--presented at our facility to be transferred   Arm Injury      Home Medications Prior to Admission medications   Medication Sig Start Date End Date Taking? Authorizing Provider  acetaminophen (TYLENOL) 325 MG tablet Take 2 tablets (650 mg total) by mouth every 6 (six) hours as needed for mild pain (pain score 1-3) (or Fever >/= 101). 09/20/23   Azucena Fallen, MD  amLODipine (NORVASC) 5 MG tablet Take 1 tablet (5 mg total) by mouth daily. 11/01/23   [name], NP  levETIRAcetam (KEPPRA) 500 MG tablet Take 1 tablet (500 mg total) by mouth 2 (two) times daily. 09/20/23   Azucena Fallen, MD  lidocaine-prilocaine (EMLA) cream Apply to affected area once 11/02/23   Si Gaul, MD  nutrition supplement, JUVEN, [facility]) PACK Take 1 packet by mouth 2 (two) times daily between meals.  10/14/23 11/13/23  Coletta Memos, MD  ondansetron (ZOFRAN) 8 MG tablet Take 1 tablet (8 mg total) by mouth every 8 (eight) hours as needed for nausea or vomiting. 11/02/23   Si Gaul, MD  oxyCODONE (ROXICODONE) 5 MG immediate release tablet Take 1 tablet (5 mg total) by mouth every 4 (four) hours as needed for severe pain (pain score 7-10) or breakthrough pain. 11/15/23   Amador Cunas, PA-C  prochlorperazine (COMPAZINE) 10 MG tablet Take 1 tablet (10 mg total) by mouth every 6 (six) hours as needed for nausea or vomiting. 11/02/23   Si Gaul, MD      Allergies    Pork allergy and Bee venom    Review of Systems   Review of Systems  Physical Exam Updated Vital Signs BP (!) 148/80 (BP Location: Left Arm)   Pulse 94   Temp 98.1 F (36.7 C) (Oral)   Resp 16   Ht 5\' 6"  (1.676 m)   Wt 55.8 kg   LMP 06/06/2010   SpO2 100%   BMI 19.85 kg/m  Physical Exam Vitals and nursing note reviewed.  Constitutional:      General: She is not in acute distress.    Appearance: She is well-developed. She is not diaphoretic.  HENT:     Head: Normocephalic and atraumatic.     Right Ear: External ear normal.     Left Ear: External ear normal.     Nose: Nose normal.     Mouth/Throat:  Mouth: Mucous membranes are moist.  Eyes:     General: No scleral icterus.    Conjunctiva/sclera: Conjunctivae normal.  Cardiovascular:     Rate and Rhythm: Normal rate and regular rhythm.     Heart sounds: Normal heart sounds. No murmur heard.    No friction rub. No gallop.  Pulmonary:     Effort: Pulmonary effort is normal. No respiratory distress.     Breath sounds: Normal breath sounds.  Abdominal:     General: Bowel sounds are normal. There is no distension.     Palpations: Abdomen is soft. There is no mass.     Tenderness: There is no abdominal tenderness. There is no guarding.  Musculoskeletal:     Cervical back: Normal range of motion.     Comments: Right arm with significant swelling.   Skin:    General: Skin is warm and dry.  Neurological:     Mental Status: She is alert and oriented to person, place, and time.  Psychiatric:        Behavior: Behavior normal.     ED Results / Procedures / Treatments   Labs (all labs ordered are listed, but only abnormal results are displayed) Labs Reviewed - No data to display   EKG None  Radiology VAS [place] UPPER EXTREMITY VENOUS DUPLEX Result Date: 11/21/2023 UPPER VENOUS STUDY  Patient Name:  MCKINNLEY COTTIER  Date of Exam:   11/21/2023 Medical Rec #: [id number]       Accession #:    [id number] Date of Birth: February 28, 1966       Patient Gender: F Patient Age:   75 years Exam [facility] Procedure:      VAS [place] UPPER EXTREMITY VENOUS DUPLEX Referring Phys: Lynden Oxford --------------------------------------------------------------------------------  Indications: Pain, and Edema Risk Factors: Recent femur fracture with surgical repair (10/10/2023), recent suture removal (11/15/2023). Comparison Study: No previous exams Performing Technologist: Jody Hill RVT, RDMS  Examination Guidelines: A complete evaluation includes B-mode imaging, spectral Doppler, color Doppler, and power Doppler as needed of all accessible portions of each vessel. Bilateral testing is considered an integral part of a complete examination. Limited examinations for reoccurring indications may be performed as noted.  Right Findings: +----------+------------+---------+-----------+----------+--------------------+ RIGHT     CompressiblePhasicitySpontaneousProperties      Summary        +----------+------------+---------+-----------+----------+--------------------+ IJV           Full       No        Yes    pulsatile                      +----------+------------+---------+-----------+----------+--------------------+ Subclavian    Full       No        Yes    pulsatile                       +----------+------------+---------+-----------+----------+--------------------+ Axillary      Full       No        Yes    pulsatile                      +----------+------------+---------+-----------+----------+--------------------+ Brachial                 Yes       Yes                   patent by  color/doppler     +----------+------------+---------+-----------+----------+--------------------+ Radial        Full                                                       +----------+------------+---------+-----------+----------+--------------------+ Ulnar                                                  Not visualized    +----------+------------+---------+-----------+----------+--------------------+ Cephalic      Full                                                       +----------+------------+---------+-----------+----------+--------------------+ Basilic                                                Not visualized    +----------+------------+---------+-----------+----------+--------------------+ Unable to visualized basilic or ulnar veins due to patient inability to move arm and edema. Limited visualization of cephalic and brachial due to immobility and edema, areas visualized appear patent.  Left Findings: +----+------------+---------+-----------+----------+-----------------------+ LEFTCompressiblePhasicitySpontaneousProperties        Summary         +----+------------+---------+-----------+----------+-----------------------+ IJV                Yes       Yes              patent by color/doppler +----+------------+---------+-----------+----------+-----------------------+  Summary:  Right: No evidence of deep vein thrombosis in the upper extremity in areas visualized. No evidence of superficial vein thrombosis in the upper extremity in areas visualized. However, unable to visualize the basilic  and ulnar veins.  Left: No evidence of thrombosis in the subclavian.  *See table(s) above for measurements and observations.  Diagnosing physician: Gerarda Fraction Electronically signed by Gerarda Fraction on 11/21/2023 at 5:31:48 PM.    Final    DG Shoulder Right Result Date: 11/21/2023 CLINICAL DATA:  Right arm pain and swelling. EXAM: RIGHT HUMERUS - 2+ VIEW; RIGHT SHOULDER - 2+ VIEW COMPARISON:  None Available. FINDINGS: No acute fracture or dislocation. Right humerus ORIF noted fixated with 2 threaded screw proximally and 1 threaded screw distally. The hardware is intact. No periprosthetic fracture or lucency. However, there is permeative pattern in the humerus in the middle third shaft and there are large areas of bone destruction surrounding the hardware. Glenohumeral and acromioclavicular joints are normal in alignment and exhibit minimal degenerative changes. No soft tissue swelling. No radiopaque foreign bodies. IMPRESSION: *No acute fracture or dislocation. *Permeative pattern in the humerus in the middle third shaft and large areas of bone destruction surrounding the hardware. Findings are compatible with metastatic disease. Electronically Signed   By: Jules Schick M.D.   On: 11/21/2023 15:51   DG Humerus Right Result Date: 11/21/2023 CLINICAL DATA:  Right arm pain and swelling. EXAM: RIGHT HUMERUS - 2+ VIEW; RIGHT SHOULDER - 2+ VIEW COMPARISON:  None Available. FINDINGS: No acute fracture or  dislocation. Right humerus ORIF noted fixated with 2 threaded screw proximally and 1 threaded screw distally. The hardware is intact. No periprosthetic fracture or lucency. However, there is permeative pattern in the humerus in the middle third shaft and there are large areas of bone destruction surrounding the hardware. Glenohumeral and acromioclavicular joints are normal in alignment and exhibit minimal degenerative changes. No soft tissue swelling. No radiopaque foreign bodies. IMPRESSION: *No acute fracture or  dislocation. *Permeative pattern in the humerus in the middle third shaft and large areas of bone destruction surrounding the hardware. Findings are compatible with metastatic disease. Electronically Signed   By: Jules Schick M.D.   On: 11/21/2023 15:51    Procedures Procedures  {Document cardiac monitor, telemetry assessment procedure when appropriate:1}  Medications Ordered in ED Medications - No data to display  ED Course/ Medical Decision Making/ A&P   {   Click here for ABCD2, HEART and other calculatorsREFRESH Note before signing :1}                              Medical Decision Making    {Document critical care time when appropriate:1} {Document review of labs and clinical decision tools ie heart score, Chads2Vasc2 etc:1}  {Document your independent review of radiology images, and any outside records:1} {Document your discussion with family members, caretakers, and with consultants:1} {Document social determinants of health affecting pt's care:1} {Document your decision making why or why not admission, treatments were needed:1} Final Clinical Impression(s) / ED Diagnoses Final diagnoses:  None    Rx / DC Orders ED Discharge Orders     None

## 2023-11-22 NOTE — Subjective & Objective (Addendum)
 Presents with severe right  arm pain. Hx of metastatic cancer and pathologic fracture of the right humerus status post nailing surgery on 10 October 2023 she has severe right arm pain and swelling affecting both shoulder and arm no associated fevers or chills no nausea no vomiting She was seen yesterday in ER initially case was discussed with patient's surgeon [name] on who felt that patient's distal screw was unstable secondary to progression of her cancer and that is probably what explaining her pain.  Dopplers negative for DVT x-ray showing bony destruction around hardware metastatic disease Patient left due to family emergency Presents back today again to [facility] With ongoing pain.  Case was again discussed with [facility] Who felt that the patient no longer a candidate for aggressive interventions and recommended palliative care consult with radiation oncology consult. Case was discussed with dr. Cherly Hensen with oncology who rec Palliative care consult and rad onc Patient admitted for pain management and palliative care consult

## 2023-11-22 NOTE — Telephone Encounter (Signed)
 I connected with Wynona Canes on 11/22/2023 at  by telephone and verified that I am speaking with the correct person using two identifiers.   I discussed the limitations, risks, security and privacy concerns of performing an evaluation and management service by telemedicine and the availability of in-person appointments. I also discussed with the patient that there may be a patient responsible charge related to this service. The patient expressed understanding and agreed to proceed.   Other persons participating in the visit and their role in the encounter: N/A   Patient's location: Home  Provider's location: [facility]   I connected with Ms. Ostrum by phone to discuss her recent ED visit in addition to missed appointments. I introduced myself and my role in her care here at the cancer center. She verbalized understanding expressing she was miserable in severe pain and discomfort related to her arm. Patient states she is trying to get herself dressed and together as she is not able to manage at home and is planning to return back to the ED. I informed patient of plans yesterday during her ED visit were in place to get her transferred to [facility] due to complex needs related to her arm. Unfortunately when staff returned she had eloped without notifying staff. Patient apologetic expressing she had to care for her disabled brother and was not thinking clearly. She states she is working on transportation and had already planned to return to [facility] ED or to go straight to [facility] ED pending availability of her transportation.   Patient is aware we will contact her at a later date to rescheduled missed oncology appointments.    Visit consisted of counseling and education dealing with the complex and emotionally intense issues of symptom management and palliative care in the setting of serious and potentially life-threatening illness.  Willette Alma, AGPCNP-BC  Palliative Medicine Team/[facility]

## 2023-11-22 NOTE — Assessment & Plan Note (Signed)
 Continue norvasc 5 mg po qday

## 2023-11-22 NOTE — Assessment & Plan Note (Signed)
 Will notify [name] that patient has been admitted also will need to notify radiation oncology for possible radiation treatment

## 2023-11-22 NOTE — ED Notes (Signed)
 Patient resting in bed watching TV

## 2023-11-22 NOTE — Assessment & Plan Note (Signed)
Chronic stable  Albuterol prn

## 2023-11-22 NOTE — H&P (Signed)
 Regina [name]:[id number] DOB: 07-15-1966 DOA: 11/22/2023     PCP: [name], NP   Outpatient Specialists:    Oncology  Dr . Arbutus Ped   Patient arrived to ER on 11/22/23 at 1300 Referred by Attending [name], MD   Patient coming from:    home Lives alone,      Chief Complaint:  right arm pain Chief Complaint  Patient presents with   Arm Injury    HPI: Regina [name] is a 58 y.o. 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      Presented with  right arm pain Presents with severe right  arm pain. Hx of metastatic cancer and pathologic fracture of the right humerus status post nailing surgery on 10 October 2023 she has severe right arm pain and swelling affecting both shoulder and arm no associated fevers or chills no nausea no vomiting She was seen yesterday in ER initially case was discussed with patient's surgeon [name] on who felt that patient's distal screw was unstable secondary to progression of her cancer and that is probably what explaining her pain.  Dopplers negative for DVT x-ray showing bony destruction around hardware metastatic disease Patient left due to family emergency Presents back today again to [facility] With ongoing pain.  Case was again discussed with [facility] Who felt that the patient no longer a candidate for aggressive interventions and recommended palliative care consult with radiation oncology consult. Case was discussed with dr. Cherly Hensen with oncology who rec Palliative care consult and rad onc Patient admitted for pain management and palliative care consult     Denies significant ETOH intake   Does not smoke   Lab Results  Component Value Date   SARSCOV2NAA Not Detected 12/24/2020   SARSCOV2NAA POSITIVE (A) 09/19/2020   SARSCOV2NAA NEGATIVE 01/30/2020   SARSCOV2NAA NEGATIVE  01/05/2020        Regarding pertinent Chronic problems:      HTN on Norvasc      COPD - not  followed by pulmonology   not  on baseline oxygen     Cancer: Management complicated by noncompliance According to notes patient opting to forego chemotherapy and has choose to go with immunotherapy instead  Stage IV (T1c, N3, M1 C) non-small cell lung cancer, adenocarcinoma presented with right upper lobe lung mass in addition to extensive bilateral hilar, mediastinal as well as right supraclavicular lymphadenopathy and metastatic disease to the bone in the right arm as well as brain metastasis diagnosed in January 2025.    Sp  Craniotomy and SRS under the care of [name] and [name] on 10/06/23 and 10/07/23 sp Right humeral nailing by [name] due to pathologic fracture on 10/10/23   CURRENT THERAPY: Pembrolizumab 200 Mg IV every 3 weeks.  First dose November 09, 2023   While in ER: Clinical Course as of 11/22/23 1917  Tue Nov 22, 2023  1701 [name] with orthopedic oncology Needs rad onc..  She states that there is no surgery that can fix which she has and that she does not think transfer is reasonable since we have radiation oncology available here and she likely just needs pain control. [AH]  1732 I consulted [name] who recommends that the patient be admitted for Paliative salt, pain control,. Radiation oncology consult regarding her pathologic fractures [AH]  1826 Potassium(!!): 2.7 [  AH]    Clinical Course User Index [AH] Arthor Captain, PA-C       Lab Orders         Basic metabolic panel         CBC with Differential         Magnesium         Phosphorus     Right humerus -  No acute fracture or dislocation. *Permeative pattern in the humerus in the middle third shaft and large areas of bone destruction surrounding the hardware. Findings are compatible with metastatic disease.  Following Medications were ordered in ER: Medications  potassium chloride 10  mEq in 100 mL IVPB (10 mEq Intravenous New Bag/Given 11/22/23 1851)  magnesium sulfate IVPB 1 g 100 mL (has no administration in time range)  oxyCODONE-acetaminophen (PERCOCET/ROXICET) 5-325 MG per tablet 2 tablet (2 tablets Oral Given 11/22/23 1623)  potassium chloride SA (KLOR-CON M) CR tablet 40 mEq (40 mEq Oral Given 11/22/23 1837)  fentaNYL (SUBLIMAZE) injection 50 mcg (50 mcg Intravenous Given 11/22/23 1853)    _______________________________________________________ ER Provider Called:     Oncology   [name] They Recommend admit to medicine    Consult to palliative care     ED Triage Vitals [11/22/23 1306]  Encounter Vitals Group     BP (!) 148/80     Systolic BP Percentile      Diastolic BP Percentile      Pulse Rate 94     Resp 16     Temp 98.1 F (36.7 C)     Temp Source Oral     SpO2 100 %     Weight 123 lb (55.8 kg)     Height 5\' 6"  (1.676 m)     Head Circumference      Peak Flow      Pain Score 10     Pain Loc      Pain Education      Exclude from Growth Chart   TMAX(24)@     _________________________________________ Significant initial  Findings: Abnormal Labs Reviewed  BASIC METABOLIC PANEL - Abnormal; Notable for the following components:      Result Value   Sodium 131 (*)    Potassium 2.7 (*)    Chloride 93 (*)    Glucose, Bld 103 (*)    All other components within normal limits  CBC WITH DIFFERENTIAL/PLATELET - Abnormal; Notable for the following components:   Hemoglobin 11.8 (*)    HCT 35.3 (*)    Platelets 492 (*)    Lymphs Abs 4.7 (*)    All other components within normal limits        ECG: Ordered Personally reviewed and interpreted by me showing: HR : 74 Rhythm:Sinus rhythm Probable left atrial enlargement S1,S2,S3 pattern Consider anterior infarct QTC 405    The recent clinical data is shown below. Vitals:   11/22/23 1306 11/22/23 1649 11/22/23 1900  BP: (!) 148/80 134/71 123/86  Pulse: 94 78 70  Resp: 16 14 16   Temp:  98.1 F (36.7 C)  97.8 F (36.6 C)  TempSrc: Oral  Oral  SpO2: 100% 100% 97%  Weight: 55.8 kg    Height: 5\' 6"  (1.676 m)      WBC     Component Value Date/Time   WBC 10.4 11/22/2023 1635   LYMPHSABS 4.7 (H) 11/22/2023 1635   LYMPHSABS 2.0 10/19/2017 0942   MONOABS 1.0 11/22/2023 1635   EOSABS 0.4 11/22/2023 1635   EOSABS  0.1 10/19/2017 0942   BASOSABS 0.1 11/22/2023 1635   BASOSABS 0.1 10/19/2017 0942     Lactic Acid, Venous    Component Value Date/Time   LATICACIDVEN 2.2 [facility]) 11/21/2023 1435      Results for orders placed or performed during the hospital encounter of 11/21/23  Blood culture (routine x 2)     Status: None (Preliminary result)   Collection Time: 11/21/23  2:35 PM   Specimen: BLOOD  Result Value Ref Range Status   Specimen Description   Final    BLOOD LEFT ANTECUBITAL Performed at [facility], [address], [place], [place] [address]    Special Requests   Final    BOTTLES DRAWN AEROBIC AND ANAEROBIC Blood Culture results may not be optimal due to an inadequate volume of blood received in culture bottles Performed at [facility], [address] [place], [place] [address]    Culture   Final    NO GROWTH < 12 HOURS Performed at [facility], [address], [place], [place] [address]    Report Status PENDING  Incomplete   ___________________________________________________ Recent Labs  Lab 11/21/23 1435 11/22/23 1635  NA 137 131*  K 2.8* 2.7*  CO2 26 26  GLUCOSE 92 103*  BUN <5* 7  CREATININE 0.64 0.66  CALCIUM 10.7* 9.9  MG  --  1.7    Cr   stable,  Lab Results  Component Value Date   CREATININE 0.66 11/22/2023   CREATININE 0.64 11/21/2023   CREATININE 0.51 11/02/2023    Recent Labs  Lab 11/21/23 1435  AST 39  ALT 20  ALKPHOS 95  BILITOT 0.4  PROT 8.0  ALBUMIN 3.7   Lab Results  Component Value Date   CALCIUM 9.9 11/22/2023   PHOS 3.2 10/08/2023    Plt: Lab Results   Component Value Date   PLT 492 (H) 11/22/2023       Recent Labs  Lab 11/21/23 1435 11/22/23 1635  WBC 9.4 10.4  NEUTROABS 4.5 4.2  HGB 13.3 11.8*  HCT 40.0 35.3*  MCV 87.3 86.1  PLT 487* 492*    HG/HCT  Down   from baseline see below    Component Value Date/Time   HGB 11.8 (L) 11/22/2023 1635   HGB 12.1 11/02/2023 0845   HGB 13.5 10/19/2017 0942   HCT 35.3 (L) 11/22/2023 1635   HCT 38.9 10/19/2017 0942   MCV 86.1 11/22/2023 1635   MCV 86 10/19/2017 0942     _______________________________________________ Hospitalist was called for admission for   Intractable pain  Hypokalemia    The following Work up has been ordered so far:  Orders Placed This Encounter  Procedures   Basic metabolic panel   CBC with Differential   Magnesium   Phosphorus   ED Cardiac monitoring   Cardiac Monitoring Continuous x 48 hours Indications for use: Other; Other indications for use: hypokalemia   Consult to hematology   Consult to hospitalist   EKG 12-Lead   Insert peripheral IV   Admit to Inpatient (patient's expected length of stay will be greater than 2 midnights or inpatient only procedure)     OTHER Significant initial  Findings:  labs showing:     DM  labs:  HbA1C: No results for input(s): "HGBA1C" in the last 8760 hours.     CBG (last 3)  No results for input(s): "GLUCAP" in the last 72 hours.        Cultures:  Component Value Date/Time   SDES  11/21/2023 1435    BLOOD LEFT ANTECUBITAL Performed at [facility], [address], [place], [place] [address]    SPECREQUEST  11/21/2023 1435    BOTTLES DRAWN AEROBIC AND ANAEROBIC Blood Culture results may not be optimal due to an inadequate volume of blood received in culture bottles Performed at [facility], [address] [place], [place] [address]    CULT  11/21/2023 1435    NO GROWTH < 12 HOURS Performed at [facility], [address] [place], [place]  [address]    REPTSTATUS PENDING 11/21/2023 1435     Radiological Exams on Admission: VAS [place] UPPER EXTREMITY VENOUS DUPLEX Result Date: 11/21/2023 UPPER VENOUS STUDY  Patient Name:  NIHIRA PUELLO  Date of Exam:   11/21/2023 Medical Rec #: [id number]       Accession #:    [id number] Date of Birth: [birth year]       Patient Gender: F Patient Age:   43 years Exam Location:  [facility] Procedure:      VAS [place] UPPER EXTREMITY VENOUS DUPLEX Referring Phys: Lynden Oxford --------------------------------------------------------------------------------  Indications: Pain, and Edema Risk Factors: Recent femur fracture with surgical repair (10/10/2023), recent suture removal (11/15/2023). Comparison Study: No previous exams Performing Technologist: Jody Hill RVT, RDMS  Examination Guidelines: A complete evaluation includes B-mode imaging, spectral Doppler, color Doppler, and power Doppler as needed of all accessible portions of each vessel. Bilateral testing is considered an integral part of a complete examination. Limited examinations for reoccurring indications may be performed as noted.  Right Findings: +----------+------------+---------+-----------+----------+--------------------+ RIGHT     CompressiblePhasicitySpontaneousProperties      Summary        +----------+------------+---------+-----------+----------+--------------------+ IJV           Full       No        Yes    pulsatile                      +----------+------------+---------+-----------+----------+--------------------+ Subclavian    Full       No        Yes    pulsatile                      +----------+------------+---------+-----------+----------+--------------------+ Axillary      Full       No        Yes    pulsatile                      +----------+------------+---------+-----------+----------+--------------------+ Brachial                 Yes       Yes                   patent by                                                               color/doppler     +----------+------------+---------+-----------+----------+--------------------+ Radial        Full                                                       +----------+------------+---------+-----------+----------+--------------------+  Ulnar                                                  Not visualized    +----------+------------+---------+-----------+----------+--------------------+ Cephalic      Full                                                       +----------+------------+---------+-----------+----------+--------------------+ Basilic                                                Not visualized    +----------+------------+---------+-----------+----------+--------------------+ Unable to visualized basilic or ulnar veins due to patient inability to move arm and edema. Limited visualization of cephalic and brachial due to immobility and edema, areas visualized appear patent.  Left Findings: +----+------------+---------+-----------+----------+-----------------------+ LEFTCompressiblePhasicitySpontaneousProperties        Summary         +----+------------+---------+-----------+----------+-----------------------+ IJV                Yes       Yes              patent by color/doppler +----+------------+---------+-----------+----------+-----------------------+  Summary:  Right: No evidence of deep vein thrombosis in the upper extremity in areas visualized. No evidence of superficial vein thrombosis in the upper extremity in areas visualized. However, unable to visualize the basilic and ulnar veins.  Left: No evidence of thrombosis in the subclavian.  *See table(s) above for measurements and observations.  Diagnosing physician: Gerarda Fraction Electronically signed by Gerarda Fraction on 11/21/2023 at 5:31:48 PM.    Final    DG Shoulder Right Result Date: 11/21/2023 CLINICAL DATA:  Right arm pain and swelling. EXAM: RIGHT  HUMERUS - 2+ VIEW; RIGHT SHOULDER - 2+ VIEW COMPARISON:  None Available. FINDINGS: No acute fracture or dislocation. Right humerus ORIF noted fixated with 2 threaded screw proximally and 1 threaded screw distally. The hardware is intact. No periprosthetic fracture or lucency. However, there is permeative pattern in the humerus in the middle third shaft and there are large areas of bone destruction surrounding the hardware. Glenohumeral and acromioclavicular joints are normal in alignment and exhibit minimal degenerative changes. No soft tissue swelling. No radiopaque foreign bodies. IMPRESSION: *No acute fracture or dislocation. *Permeative pattern in the humerus in the middle third shaft and large areas of bone destruction surrounding the hardware. Findings are compatible with metastatic disease. Electronically Signed   By: Jules Schick M.D.   On: 11/21/2023 15:51   DG Humerus Right Result Date: 11/21/2023 CLINICAL DATA:  Right arm pain and swelling. EXAM: RIGHT HUMERUS - 2+ VIEW; RIGHT SHOULDER - 2+ VIEW COMPARISON:  None Available. FINDINGS: No acute fracture or dislocation. Right humerus ORIF noted fixated with 2 threaded screw proximally and 1 threaded screw distally. The hardware is intact. No periprosthetic fracture or lucency. However, there is permeative pattern in the humerus in the middle third shaft and there are large areas of bone destruction surrounding the hardware. Glenohumeral and acromioclavicular joints are normal in alignment and exhibit minimal degenerative changes. No soft tissue swelling. No radiopaque foreign bodies.  IMPRESSION: *No acute fracture or dislocation. *Permeative pattern in the humerus in the middle third shaft and large areas of bone destruction surrounding the hardware. Findings are compatible with metastatic disease. Electronically Signed   By: Jules Schick M.D.   On: 11/21/2023 15:51    _______________________________________________________________________________________________________ Latest  Blood pressure 123/86, pulse 70, temperature 97.8 F (36.6 C), temperature source Oral, resp. rate 16, height 5\' 6"  (1.676 m), weight 55.8 kg, last menstrual period 06/06/2010, SpO2 97%.   Vitals  labs and radiology finding personally reviewed  Review of Systems:    Pertinent positives include: right arm pain  Constitutional:  No weight loss, night sweats, Fevers, chills, fatigue, weight loss  HEENT:  No headaches, Difficulty swallowing,Tooth/dental problems,Sore throat,  No sneezing, itching, ear ache, nasal congestion, post nasal drip,  Cardio-vascular:  No chest pain, Orthopnea, PND, anasarca, dizziness, palpitations.no Bilateral lower extremity swelling  GI:  No heartburn, indigestion, abdominal pain, nausea, vomiting, diarrhea, change in bowel habits, loss of appetite, melena, blood in stool, hematemesis Resp:  no shortness of breath at rest. No dyspnea on exertion, No excess mucus, no productive cough, No non-productive cough, No coughing up of blood.No change in color of mucus.No wheezing. Skin:  no rash or lesions. No jaundice GU:  no dysuria, change in color of urine, no urgency or frequency. No straining to urinate.  No flank pain.  Musculoskeletal:  No joint pain or no joint swelling. No decreased range of motion. No back pain.  Psych:  No change in mood or affect. No depression or anxiety. No memory loss.  Neuro: no localizing neurological complaints, no tingling, no weakness, no double vision, no gait abnormality, no slurred speech, no confusion  All systems reviewed and apart from HOPI all are negative _______________________________________________________________________________________________ Past Medical History:   Past Medical History:  Diagnosis Date   Allergy    Anxiety    Brain mass    COPD (chronic obstructive pulmonary disease) (HCC)     Emphysema of lung (HCC)    Hypertension       Past Surgical History:  Procedure Laterality Date   APPLICATION OF CRANIAL NAVIGATION Left 10/07/2023   Procedure: APPLICATION OF CRANIAL NAVIGATION;  Surgeon: Coletta Memos, MD;  Location: [facility] OR;  Service: Neurosurgery;  Laterality: Left;   CRANIOTOMY Left 10/07/2023   Procedure: FRONTAL CRANIOTOMY FOR ABSCESS;  Surgeon: Coletta Memos, MD;  Location[facility] OR;  Service: Neurosurgery;  Laterality: Left;   dental procedure     HUMERUS IM NAIL Right 10/10/2023   Procedure: INTRAMEDULLARY (IM) NAIL HUMERAL RIGHT;  Surgeon: Huel Cote, MD;  Location: [facility] OR;  Service: Orthopedics;  Laterality: Right;    Social History:  Ambulatory   independently     reports that she quit smoking about 4 years ago. Her smoking use included cigarettes. She has never used smokeless tobacco. She reports current alcohol use of about 6.0 standard drinks of alcohol per week. She reports current drug use. Frequency: 4.00 times per week. Drug: Marijuana.     Family History:   Family History  Problem Relation Age of Onset   Leukemia Mother    Hypertension Mother    Hypertension Father    Cancer Neg Hx    Diabetes Neg Hx    Heart disease Neg Hx    Colon cancer Neg Hx    Esophageal cancer Neg Hx    Rectal cancer Neg Hx    Stomach cancer Neg Hx    ______________________________________________________________________________________________ Allergies: Allergies  Allergen Reactions  Pork Allergy Nausea And Vomiting   Bee Venom Hives     Prior to Admission medications   Medication Sig Start Date End Date Taking? Authorizing Provider  acetaminophen (TYLENOL) 325 MG tablet Take 2 tablets (650 mg total) by mouth every 6 (six) hours as needed for mild pain (pain score 1-3) (or Fever >/= 101). 09/20/23   Azucena Fallen, MD  amLODipine (NORVASC) 5 MG tablet Take 1 tablet (5 mg total) by mouth daily. 11/01/23   [name], NP  levETIRAcetam (KEPPRA)  500 MG tablet Take 1 tablet (500 mg total) by mouth 2 (two) times daily. 09/20/23   Azucena Fallen, MD  lidocaine-prilocaine (EMLA) cream Apply to affected area once 11/02/23   Si Gaul, MD  nutrition supplement, JUVEN, [facility]) PACK Take 1 packet by mouth 2 (two) times daily between meals. 10/14/23 11/13/23  Coletta Memos, MD  ondansetron (ZOFRAN) 8 MG tablet Take 1 tablet (8 mg total) by mouth every 8 (eight) hours as needed for nausea or vomiting. 11/02/23   Si Gaul, MD  oxyCODONE (ROXICODONE) 5 MG immediate release tablet Take 1 tablet (5 mg total) by mouth every 4 (four) hours as needed for severe pain (pain score 7-10) or breakthrough pain. 11/15/23   Amador Cunas, PA-C  prochlorperazine (COMPAZINE) 10 MG tablet Take 1 tablet (10 mg total) by mouth every 6 (six) hours as needed for nausea or vomiting. 11/02/23   Si Gaul, MD    ___________________________________________________________________________________________________ Physical Exam:    11/22/2023    7:00 PM 11/22/2023    4:49 PM 11/22/2023    1:06 PM  Vitals with BMI  Height   5\' 6"   Weight   123 lbs  BMI   19.86  Systolic 123 134 469  Diastolic 86 71 80  Pulse 70 78 94     1. General:  in No  Acute distress   Chronically ill  -appearing 2. Psychological: Alert and   Oriented 3. Head/ENT:  Dry Mucous Membranes                          Head Non traumatic, neck supple                       Poor Dentition 4. SKIN: decreased Skin turgor,  Skin clean Dry and intact no rash right arm swelling     5. Heart: Regular rate and rhythm no  Murmur, no Rub or gallop 6. Lungs:  Clear to auscultation bilaterally, no wheezes or crackles   7. Abdomen: Soft,  non-tender, Non distended bowel sounds present 8. Lower extremities: no clubbing, cyanosis, no  edema 9. Neurologically Grossly intact, moving all 4 extremities equally   10. MSK: Normal range of motion    Chart has been  reviewed  ______________________________________________________________________________________________  Assessment/Plan 58 y.o. 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    Admitted for   Intractable pain, Hypokalemia     Present on Admission:  Pathologic fracture  COPD (chronic obstructive pulmonary disease) (HCC)  HTN (hypertension)  Lung cancer metastatic to brain (HCC)  Hypokalemia     COPD (chronic obstructive pulmonary disease) (HCC) Chronic stable Albuterol prn  HTN (hypertension) Continue norvasc 5 mg po qday  Lung cancer metastatic to brain [facility]) Will notify [name] that patient has been admitted  also will need to notify radiation oncology for possible radiation treatment  Hypokalemia - will replace electrolytes and repeat  check Mg, phos and Ca level and replace as needed Monitor on telemetry   Lab Results  Component Value Date   K 2.7 (LL) 11/22/2023     Lab Results  Component Value Date   CREATININE 0.66 11/22/2023   Lab Results  Component Value Date   MG 1.7 11/22/2023   Lab Results  Component Value Date   CALCIUM 9.9 11/22/2023   PHOS 3.2 10/08/2023      Other plan as per orders.  DVT prophylaxis:  SCD      Code Status:    Code Status: Prior FULL CODE   as per patient  I had personally discussed CODE STATUS with patient   ACP  has been reviewed     Family Communication:   Family not at  Bedside    Diet  regular   Disposition Plan:      To home once workup is complete and patient is stable   Following barriers for discharge:                                                     Electrolytes corrected                                                          Pain controlled with PO medications                                                         Will need consultants to evaluate patient  prior to discharge       Consult Orders  (From admission, onward)           Start     Ordered   11/22/23 1828  Consult to hospitalist  Once       Provider:  (Not yet assigned)  Question Answer Comment  Place call to: [facility]   Reason for Consult Admit      11/22/23 1827                               Nutrition    consulted                                      Palliative care    consulted                                        Consults called: Rad/Onc Dr. Basilio Cairo sent msg to oncology   Admission status:  ED Disposition     ED Disposition  Admit   Condition  --   Comment  Hospital [facility]: [facility] [[id number]]  Level of Care: Telemetry [5]  Admit to tele based on following criteria: Other see comments  Comments: hypokalemia  May admit patient to [facility] or [facility] if equivalent level of care is available:: No  Covid Evaluation: Asymptomatic - no recent exposure (last 10 days) testing not required  Diagnosis: Pathologic fracture [161096]  Admitting Physician: [name] [[id number]]  Attending Physician: [name] [[id number]]  Certification:: I certify this patient will need inpatient services for at least 2 midnights  Expected Medical Readiness: 11/25/2023            inpatient     I Expect 2 midnight stay secondary to severity of patient's current illness need for inpatient interventions justified by the following:   Severe lab/radiological/exam abnormalities including:    Pathologic fracture and extensive comorbidities including:   malignancy,   That are currently affecting medical management.   I expect  patient to be hospitalized for 2 midnights requiring inpatient medical care.  Patient is at high risk for adverse outcome (such as loss of life or disability) if not treated.  Indication for inpatient stay as follows:    Need for operative/procedural  intervention     Need for  radiation therapy     Level of care     tele  For 12H    Lab Results  Component Value Date   SARSCOV2NAA Not Detected 12/24/2020      [name] 11/22/2023, 8:16 PM    [facility]     after 2 AM please page floor coverage PA If 7AM-7PM, please contact the day team taking care of the patient using [email or web address]

## 2023-11-22 NOTE — Assessment & Plan Note (Signed)
-   will replace electrolytes and repeat  check Mg, phos and Ca level and replace as needed Monitor on telemetry   Lab Results  Component Value Date   K 2.7 (LL) 11/22/2023     Lab Results  Component Value Date   CREATININE 0.66 11/22/2023   Lab Results  Component Value Date   MG 1.7 11/22/2023   Lab Results  Component Value Date   CALCIUM 9.9 11/22/2023   PHOS 3.2 10/08/2023

## 2023-11-23 ENCOUNTER — Ambulatory Visit
Admit: 2023-11-23 | Discharge: 2023-11-23 | Disposition: A | Discharge status: Home / Self Care | Attending: Radiation Oncology | Admitting: Radiation Oncology

## 2023-11-23 ENCOUNTER — Ambulatory Visit
Admission: RE | Admit: 2023-11-23 | Discharge: 2023-11-23 | Disposition: A | Discharge status: Home / Self Care | Payer: MEDICAID | Source: Ambulatory Visit | Attending: Urology | Admitting: Urology

## 2023-11-23 ENCOUNTER — Ambulatory Visit
Admit: 2023-11-23 | Discharge: 2023-11-23 | Disposition: A | Discharge status: Home / Self Care | Payer: MEDICAID | Attending: Radiation Oncology | Admitting: Radiation Oncology

## 2023-11-23 VITALS — BP 132/86 | HR 82 | Temp 98.8°F | Resp 19 | Ht 66.0 in | Wt 118.0 lb

## 2023-11-23 DIAGNOSIS — E876 Hypokalemia: Secondary | ICD-10-CM | POA: Diagnosis not present

## 2023-11-23 DIAGNOSIS — M84521A Pathological fracture in neoplastic disease, right humerus, initial encounter for fracture: Secondary | ICD-10-CM | POA: Insufficient documentation

## 2023-11-23 DIAGNOSIS — C7931 Secondary malignant neoplasm of brain: Secondary | ICD-10-CM | POA: Diagnosis not present

## 2023-11-23 DIAGNOSIS — M84521G Pathological fracture in neoplastic disease, right humerus, subsequent encounter for fracture with delayed healing: Secondary | ICD-10-CM

## 2023-11-23 DIAGNOSIS — C7951 Secondary malignant neoplasm of bone: Secondary | ICD-10-CM

## 2023-11-23 DIAGNOSIS — I10 Essential (primary) hypertension: Secondary | ICD-10-CM | POA: Diagnosis not present

## 2023-11-23 DIAGNOSIS — J42 Unspecified chronic bronchitis: Secondary | ICD-10-CM | POA: Diagnosis not present

## 2023-11-23 DIAGNOSIS — C3490 Malignant neoplasm of unspecified part of unspecified bronchus or lung: Secondary | ICD-10-CM | POA: Diagnosis not present

## 2023-11-23 LAB — COMPREHENSIVE METABOLIC PANEL
ALT: 17 U/L (ref 0–44)
AST: 32 U/L (ref 15–41)
Albumin: 3.1 g/dL — ABNORMAL LOW (ref 3.5–5.0)
Alkaline Phosphatase: 76 U/L (ref 38–126)
Anion gap: 10 (ref 5–15)
BUN: 5 mg/dL — ABNORMAL LOW (ref 6–20)
CO2: 25 mmol/L (ref 22–32)
Calcium: 9.4 mg/dL (ref 8.9–10.3)
Chloride: 101 mmol/L (ref 98–111)
Creatinine, Ser: 0.55 mg/dL (ref 0.44–1.00)
GFR, Estimated: >60 mL/min (ref >60)
Glucose, Bld: 95 mg/dL (ref 70–99)
Potassium: 3.3 mmol/L — ABNORMAL LOW (ref 3.5–5.1)
Sodium: 136 mmol/L (ref 135–145)
Total Bilirubin: 0.6 mg/dL (ref 0.0–1.2)
Total Protein: 6.5 g/dL (ref 6.5–8.1)

## 2023-11-23 LAB — MAGNESIUM: Magnesium: 1.7 mg/dL (ref 1.7–2.4)

## 2023-11-23 LAB — PREALBUMIN: Prealbumin: 8 mg/dL — ABNORMAL LOW (ref 18–38)

## 2023-11-23 LAB — CBC
HCT: 35.1 % — ABNORMAL LOW (ref 36.0–46.0)
Hemoglobin: 11.2 g/dL — ABNORMAL LOW (ref 12.0–15.0)
MCH: 28.3 pg (ref 26.0–34.0)
MCHC: 31.9 g/dL (ref 30.0–36.0)
MCV: 88.6 fL (ref 80.0–100.0)
Platelets: 454 10*3/uL — ABNORMAL HIGH (ref 150–400)
RBC: 3.96 MIL/uL (ref 3.87–5.11)
RDW: 15.1 % (ref 11.5–15.5)
WBC: 8.9 10*3/uL (ref 4.0–10.5)
nRBC: 0 % (ref 0.0–0.2)

## 2023-11-23 LAB — VITAMIN B12: Vitamin B-12: 1477 pg/mL — ABNORMAL HIGH (ref 180–914)

## 2023-11-23 LAB — PHOSPHORUS: Phosphorus: 3 mg/dL (ref 2.5–4.6)

## 2023-11-23 LAB — FOLATE: Folate: 25 ng/mL (ref >5.9)

## 2023-11-23 MED ORDER — OXYCODONE HCL 5 MG PO TABS
5.0000 mg | ORAL_TABLET | Freq: Once | ORAL | Status: AC
  Administered 2023-11-23: 5 mg via ORAL

## 2023-11-23 MED ORDER — POTASSIUM CHLORIDE CRYS ER 20 MEQ PO TBCR
40.0000 meq | EXTENDED_RELEASE_TABLET | Freq: Once | ORAL | Status: AC
  Administered 2023-11-23: 40 meq via ORAL
  Filled 2023-11-23: qty 2

## 2023-11-23 MED ORDER — OXYCODONE HCL 5 MG PO TABS: 5.0000 mg | ORAL_TABLET | Freq: Once | ORAL | Status: DC

## 2023-11-23 MED ORDER — OXYCODONE HCL 5 MG PO TABS
ORAL_TABLET | ORAL | Status: AC
  Filled 2023-11-23: qty 1

## 2023-11-23 NOTE — Progress Notes (Signed)
 PROGRESS NOTE    Regina [name]  QIO:[id number] DOB: [birth year] DOA: 11/22/2023 PCP: [name], NP  Chief Complaint  Patient presents with   Arm Injury    Hospital Course:  Regina [name] is 58 y.o. 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  She presents on this admission due to right arm pain and swelling.  She recently had sutures removed on 3/11 when she saw PA in the orthopedic office and complained of pain and swelling.  During that visit she was initiated on antibiotics and oxycodone.  Due to ongoing pain she presented in the ER on 3/17.  X-ray at that time revealed bony destruction around the hardware with metastatic disease.  EDP discussed directly with orthopedic surgeon [name], who suggested the distal screw is backing out due to progression of the cancer.  He requested transfer to [facility] for multi special management, which was ultimately approved however patient eloped from the ED prior to admission.  Patient reports her elopement was secondary to a family emergency and ultimately returned to the ED on 3/18 due to worsening pain.  Admitting physician again discussed with [facility] who now feels that the patient is no longer a candidate for aggressive interventions and recommends palliative care consult with radiation oncology.  The case was then discussed with Dr. Cherly Hensen of oncology who recommended palliative care consult and rad/onc.  Patient was admitted for pain management   Subjective: Evaluated after radiation simulation treatment today.  She reports that she is tired.  Reports her arm is feeling somewhat better with the new pain medications.  She would like to rest.     Objective: Vitals:   11/22/23 1900 11/22/23 2043 11/23/23 0045 11/23/23 0404  BP: 123/86 (!) 127/105 125/70 128/72  Pulse: 70 72 70 79  Resp: 16 18 18 18   Temp: 97.8 F  (36.6 C) 98.5 F (36.9 C) 98.5 F (36.9 C) 99 F (37.2 C)  TempSrc: Oral Oral Oral Oral  SpO2: 97% 97% 90% 90%  Weight:  53.9 kg    Height:       No intake or output data in the 24 hours ending 11/23/23 1003 Filed Weights   11/22/23 1306 11/22/23 2043  Weight: 55.8 kg 53.9 kg    Examination: General exam: Appears calm and comfortable, NAD  Respiratory system: No work of breathing, symmetric chest wall expansion Cardiovascular system: S1 & S2 heard, RRR.  Gastrointestinal system: Abdomen is nondistended, soft and nontender.  Neuro: Alert and oriented. No focal neurological deficits. Extremities: right Upper arm, swollen, diffusely tender to palpation. Skin: No rashes, lesions Psychiatry: Demonstrates appropriate judgement and insight. Mood & affect appropriate for situation.   Assessment & Plan:  Principal Problem:   Pathologic fracture Active Problems:   HTN (hypertension)   COPD (chronic obstructive pulmonary disease) (HCC)   Lung cancer metastatic to brain (HCC)   Hypokalemia   Intractable pain - Secondary to malignancy - Continue with IV opioids, titrate as tolerated - Palliative care team consulted to assist in symptom management  Pathologic fracture of right humerus - IM nail 2/3, was loss to follow-up.  Suture removal 3/11.  There was concern for superimposed infection at that time. - Doppler on arrival negative for DVT. 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 - Oncology consulted - Radiation treatment  initiated - first treatment tomorrow - Palliative care team consulted for GOC conversations  COPD, currently in exacerbation - On room air - No wheeze - As needed nebs  Hypertension - Continue home meds  Hypokalemia - K2.7 on arrival - Replace as needed  DVT prophylaxis: heparin   Code Status: Full Code Family Communication:  Discussed directly with patient Disposition:   Inpatient still hospitalized for pain control, will discharge to home with [facility] when stable  Consultants:  Treatment Team:  Consulting Physician: Si Gaul, MD  Procedures:    Antimicrobials:  Anti-infectives (From admission, onward)    None       Data Reviewed: I have personally reviewed following labs and imaging studies CBC: Recent Labs  Lab 11/21/23 1435 11/22/23 1635 11/23/23 0635  WBC 9.4 10.4 8.9  NEUTROABS 4.5 4.2  --   HGB 13.3 11.8* 11.2*  HCT 40.0 35.3* 35.1*  MCV 87.3 86.1 88.6  PLT 487* 492* 454*   Basic Metabolic Panel: Recent Labs  Lab 11/21/23 1435 11/22/23 1635 11/22/23 2058 11/23/23 0635  NA 137 131*  --  136  K 2.8* 2.7*  --  3.3*  CL 98 93*  --  101  CO2 26 26  --  25  GLUCOSE 92 103*  --  95  BUN <5* 7  --  5*  CREATININE 0.64 0.66  --  0.55  CALCIUM 10.7* 9.9  --  9.4  MG  --  1.7  --  1.7  PHOS  --   --  3.4 3.0   GFR: Estimated Creatinine Clearance: 64.4 mL/min (by C-G formula based on SCr of 0.55 mg/dL). Liver Function Tests: Recent Labs  Lab 11/21/23 1435 11/23/23 0635  AST 39 32  ALT 20 17  ALKPHOS 95 76  BILITOT 0.4 0.6  PROT 8.0 6.5  ALBUMIN 3.7 3.1*   CBG: No results for input(s): "GLUCAP" in the last 168 hours.  Recent Results (from the past 240 hours)  Blood culture (routine x 2)     Status: None (Preliminary result)   Collection Time: 11/21/23  2:35 PM   Specimen: BLOOD  Result Value Ref Range Status   Specimen Description   Final    BLOOD LEFT ANTECUBITAL Performed at [facility], [address] [place], [place] [address]    Special Requests   Final    BOTTLES DRAWN AEROBIC AND ANAEROBIC Blood Culture results may not be optimal due to an inadequate volume of blood received in culture bottles Performed at [facility], [address] [place], [place] [address]    Culture   Final    NO GROWTH 2 DAYS Performed at [facility], [address],  [place], [place] [address]    Report Status PENDING  Incomplete     Radiology Studies: VAS [place] UPPER EXTREMITY VENOUS DUPLEX Result Date: 11/21/2023 UPPER VENOUS STUDY  Patient Name:  ZAMIA TYMINSKI  Date of Exam:   11/21/2023 Medical Rec #: [id number]       Accession #:    [id number] Date of Birth: [birth year]       Patient Gender: F Patient Age:   29 years Exam Location:  [facility] Procedure:      VAS [place] UPPER EXTREMITY VENOUS DUPLEX Referring Phys: Lynden Oxford --------------------------------------------------------------------------------  Indications: Pain, and Edema Risk Factors: Recent femur fracture with surgical repair (10/10/2023), recent suture removal (11/15/2023). Comparison Study: No previous exams Performing Technologist: Jody Hill RVT, RDMS  Examination  Guidelines: A complete evaluation includes B-mode imaging, spectral Doppler, color Doppler, and power Doppler as needed of all accessible portions of each vessel. Bilateral testing is considered an integral part of a complete examination. Limited examinations for reoccurring indications may be performed as noted.  Right Findings: +----------+------------+---------+-----------+----------+--------------------+ RIGHT     CompressiblePhasicitySpontaneousProperties      Summary        +----------+------------+---------+-----------+----------+--------------------+ IJV           Full       No        Yes    pulsatile                      +----------+------------+---------+-----------+----------+--------------------+ Subclavian    Full       No        Yes    pulsatile                      +----------+------------+---------+-----------+----------+--------------------+ Axillary      Full       No        Yes    pulsatile                      +----------+------------+---------+-----------+----------+--------------------+ Brachial                 Yes       Yes                   patent by                                                               color/doppler     +----------+------------+---------+-----------+----------+--------------------+ Radial        Full                                                       +----------+------------+---------+-----------+----------+--------------------+ Ulnar                                                  Not visualized    +----------+------------+---------+-----------+----------+--------------------+ Cephalic      Full                                                       +----------+------------+---------+-----------+----------+--------------------+ Basilic                                                Not visualized    +----------+------------+---------+-----------+----------+--------------------+ Unable to visualized basilic or ulnar veins due to patient inability to move arm and edema. Limited visualization of cephalic and brachial due to immobility and edema, areas visualized appear patent.  Left Findings: +----+------------+---------+-----------+----------+-----------------------+ LEFTCompressiblePhasicitySpontaneousProperties  Summary         +----+------------+---------+-----------+----------+-----------------------+ IJV                Yes       Yes              patent by color/doppler +----+------------+---------+-----------+----------+-----------------------+  Summary:  Right: No evidence of deep vein thrombosis in the upper extremity in areas visualized. No evidence of superficial vein thrombosis in the upper extremity in areas visualized. However, unable to visualize the basilic and ulnar veins.  Left: No evidence of thrombosis in the subclavian.  *See table(s) above for measurements and observations.  Diagnosing physician: Gerarda Fraction Electronically signed by Gerarda Fraction on 11/21/2023 at 5:31:48 PM.    Final    DG Shoulder Right Result Date: 11/21/2023 CLINICAL DATA:  Right arm pain and swelling. EXAM: RIGHT  HUMERUS - 2+ VIEW; RIGHT SHOULDER - 2+ VIEW COMPARISON:  None Available. FINDINGS: No acute fracture or dislocation. Right humerus ORIF noted fixated with 2 threaded screw proximally and 1 threaded screw distally. The hardware is intact. No periprosthetic fracture or lucency. However, there is permeative pattern in the humerus in the middle third shaft and there are large areas of bone destruction surrounding the hardware. Glenohumeral and acromioclavicular joints are normal in alignment and exhibit minimal degenerative changes. No soft tissue swelling. No radiopaque foreign bodies. IMPRESSION: *No acute fracture or dislocation. *Permeative pattern in the humerus in the middle third shaft and large areas of bone destruction surrounding the hardware. Findings are compatible with metastatic disease. Electronically Signed   By: Jules Schick M.D.   On: 11/21/2023 15:51   DG Humerus Right Result Date: 11/21/2023 CLINICAL DATA:  Right arm pain and swelling. EXAM: RIGHT HUMERUS - 2+ VIEW; RIGHT SHOULDER - 2+ VIEW COMPARISON:  None Available. FINDINGS: No acute fracture or dislocation. Right humerus ORIF noted fixated with 2 threaded screw proximally and 1 threaded screw distally. The hardware is intact. No periprosthetic fracture or lucency. However, there is permeative pattern in the humerus in the middle third shaft and there are large areas of bone destruction surrounding the hardware. Glenohumeral and acromioclavicular joints are normal in alignment and exhibit minimal degenerative changes. No soft tissue swelling. No radiopaque foreign bodies. IMPRESSION: *No acute fracture or dislocation. *Permeative pattern in the humerus in the middle third shaft and large areas of bone destruction surrounding the hardware. Findings are compatible with metastatic disease. Electronically Signed   By: Jules Schick M.D.   On: 11/21/2023 15:51    Scheduled Meds:  amLODipine  5 mg Oral Daily   Continuous Infusions:   LOS:  1 day  MDM: Patient is high risk for one or more organ failure.  They necessitate ongoing hospitalization for continued IV therapies and subsequent lab monitoring. Total time spent interpreting labs and vitals, coordinating care amongst consultants and care team members, directly assessing and discussing care with the patient and/or family: 55 min    Debarah Crape, DO [facility]  To contact the attending physician between 7A-7P please use Epic Chat. To contact the covering physician during after hours 7P-7A, please review Amion.   11/23/2023, 10:03 AM   *This document has been created with the assistance of dictation software. Please excuse typographical errors. *

## 2023-11-23 NOTE — Plan of Care (Signed)

## 2023-11-23 NOTE — Progress Notes (Signed)
 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 SRS to Brain from 1/28-1/30 2025 Received radiation therapy to the right arm to address right arm metastasis   Patient's main complaints related to symptomatic tumor(s) are:  Pain in right humerus, characteristics include aching throbbing in nature. Limited use of the right arm Bokshan, MD 10/10/2023 Metastatic pathological fracture of the midshaft humerus in the setting of adenocarcinoma of the lung. Right Humeral Nailing 10/10/23 by Steward Drone, MD  Pain on a scale of 0-10 is: pain is a 10 out of 10  Pain description is aching and throbbing.     If Spine Met(s), symptoms, if any, include: Bowel/Bladder retention or incontinence (please describe):  Patient denies, she is ambulatory and goes to the bathroom Numbness or weakness in extremities (please describe):  Patient said while she was walking in the bathroom this morning she felt some numbness in her left leg. Current Decadron regimen, if applicable:  None  Ambulatory status? Walker? Wheelchair?:  None   SAFETY ISSUES: Prior radiation? Yes, patient had radiation to her brain  Pacemaker/ICD? None Possible current pregnancy? N/A Is the patient on methotrexate? None  Additional Complaints / other details:    Patient arrived from [facility] [address] [facility] to Radiation oncology room one. Upon assessment patient seem to be in a lot of discomfort. Pain level was assessed and patient was in tears saying her pain is a 10 out of 10  Pain description is aching and throbbing.  PRN oxycodone  5mg  given   VS is Temp oral 98.8, 132/86, pulse is 82 and spo2 is 94% on room air Wt Readings from Last 3  Encounters:  11/22/23 118 lb 13.3 oz (53.9 kg)  11/02/23 118 lb 12.8 oz (53.9 kg)  11/01/23 118 lb 3.2 oz (53.6 kg)

## 2023-11-23 NOTE — Progress Notes (Signed)
 Initial Nutrition Assessment  DOCUMENTATION CODES:   Severe malnutrition in context of chronic illness  INTERVENTION:   -Mightyshakes TID with meals, each provides 220 kcals and 6g protein  -Encourage PO intakes  -Placed "High Calorie, High Protein" handout in AVS  NUTRITION DIAGNOSIS:   Severe Malnutrition related to chronic illness, cancer and cancer related treatments as evidenced by severe fat depletion, severe muscle depletion.  GOAL:   Patient will meet greater than or equal to 90% of their needs  MONITOR:   PO intake, Supplement acceptance  REASON FOR ASSESSMENT:   Consult Assessment of nutrition requirement/status  ASSESSMENT:   58 y.o. 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  Pt in room, states she hasn't had time to eat her breakfast yet as she just got back from radiation. Has rice krispies with milk, a banana and some coffee. Adamantly states she is eating well with no issues with swallowing, chewing or taste. Eats throughout the day but doesn't provide much detail on what exactly she eats. States she hasn't been drinking protein shakes at home. Will trial Mightyshakes with meals. Encouraged pt to try and drink dairy with every meal for protein.  Patient reports she has started gaining weight. States her last weight at the doctor was 126-127 lbs. Current weight: 118 lbs  Medications reviewed.  Labs reviewed: Low K   NUTRITION - FOCUSED PHYSICAL EXAM:  Flowsheet Row Most Recent Value  Orbital Region Severe depletion  Upper Arm Region Unable to assess  [rt arm in sling]  Thoracic and Lumbar Region Moderate depletion  Buccal Region Severe depletion  Temple Region Moderate depletion  Clavicle Bone Region Severe depletion  Clavicle and Acromion Bone Region Severe depletion  Scapular Bone Region Unable to assess  Dorsal  Hand Moderate depletion  Patellar Region Severe depletion  Anterior Thigh Region Severe depletion  Posterior Calf Region Severe depletion  Edema (RD Assessment) None  Hair Reviewed  [covered]  Eyes Reviewed  Mouth Reviewed  Skin Reviewed  Nails Reviewed       Diet Order:   Diet Order             Diet regular Room service appropriate? Yes; Fluid consistency: Thin  Diet effective now                   EDUCATION NEEDS:   No education needs have been identified at this time  Skin:  Skin Assessment: Reviewed RN Assessment  Last BM:  PTA  Height:   Ht Readings from Last 1 Encounters:  11/22/23 5\' 6"  (1.676 m)    Weight:   Wt Readings from Last 1 Encounters:  11/22/23 53.9 kg    BMI:  Body mass index is 19.18 kg/m.  Estimated Nutritional Needs:   Kcal:  1650-1850  Protein:  80-90g  Fluid:  1.8L/day   Tilda Franco, MS, RD, LDN Inpatient Clinical Dietitian Contact via Secure chat

## 2023-11-23 NOTE — Discharge Instructions (Signed)

## 2023-11-23 NOTE — Progress Notes (Signed)
 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    Molecular Studies: No actionable mutations   TMB: 14 mut/mb   PDL1" 90%   PRIOR THERAPY:  1) Craniotomy and SRS under the care of [name] and [name] on 10/06/23 and 10/07/23 2) Right humeral nailing by [name] due to pathologic fracture on 10/10/23   CURRENT THERAPY: Pembrolizumab 200 Mg IV every 3 weeks.  First dose was supposed to start November 09, 2023  Subjective: The patient is seen and examined today she is feeling fine with no concerning complaints except for pain in the right arm.  She was diagnosed with stage IV non-small cell lung cancer adenocarcinoma in January 2025 presented with right upper lobe lung mass in addition to extensive bilateral hilar/mediastinal as well as right supraclavicular lymphadenopathy and metastatic disease in the bones of the right arm as well as brain metastasis.  Molecular studies showed no actionable mutation but she has PD-L1 expression of 90%.  The patient underwent craniotomy followed by SRS to the brain metastasis in addition to right humeral nailing for pathologic fracture and February 2025.  She was supposed to start systemic chemotherapy in early March 2025 but the patient did not show up for her appointments and kept delaying her treatment.  She was admitted to the hospital yesterday with right arm pain and swelling.  She had x-ray of the right arm that showed bony destruction around the hardware with metastatic disease.  She is expected to have another surgery for her right arm by [name].  She has no other complaints today  Objective: Vital signs in last 24 hours: Temp:  [97.8 F (36.6 C)-99.1 F (37.3 C)]  99.1 F (37.3 C) (03/19 1340) Pulse Rate:  [70-87] 87 (03/19 1340) Resp:  [14-19] 16 (03/19 1340) BP: (123-134)/(70-105) 132/74 (03/19 1340) SpO2:  [90 %-100 %] 91 % (03/19 1340) Weight:  [118 lb (53.5 kg)-118 lb 13.3 oz (53.9 kg)] 118 lb (53.5 kg) (03/19 0817)  Intake/Output from previous day: No intake/output data recorded. Intake/Output this shift: Total I/O In: 220 [P.O.:220] Out: -   General appearance: alert, cooperative, fatigued, and no distress Resp: clear to auscultation bilaterally Cardio: regular rate and rhythm, S1, S2 normal, no murmur, click, rub or gallop GI: soft, non-tender; bowel sounds normal; no masses,  no organomegaly Extremities: extremities normal, atraumatic, no cyanosis or edema  Lab Results:  Recent Labs    11/22/23 1635 11/23/23 0635  WBC 10.4 8.9  HGB 11.8* 11.2*  HCT 35.3* 35.1*  PLT 492* 454*   BMET Recent Labs    11/22/23 1635 11/23/23 0635  NA 131* 136  K 2.7* 3.3*  CL 93* 101  CO2 26 25  GLUCOSE 103* 95  BUN 7 5*  CREATININE 0.66 0.55  CALCIUM 9.9 9.4    Studies/Results: VAS [place] UPPER EXTREMITY VENOUS DUPLEX Result Date: 11/21/2023 UPPER VENOUS STUDY  Patient Name:  Regina [name]  Date of Exam:   11/21/2023 Medical Rec #: [id number]       Accession #:    [id number] Date of Birth: [birth year]       Patient Gender: F Patient Age:   58 years Exam Location:  [facility] Procedure:  VAS [place] UPPER EXTREMITY VENOUS DUPLEX Referring Phys: Lynden Oxford --------------------------------------------------------------------------------  Indications: Pain, and Edema Risk Factors: Recent femur fracture with surgical repair (10/10/2023), recent suture removal (11/15/2023). Comparison Study: No previous exams Performing Technologist: Jody Hill RVT, RDMS  Examination Guidelines: A complete evaluation includes B-mode imaging, spectral Doppler, color Doppler, and power Doppler as needed of all accessible portions of each vessel. Bilateral  testing is considered an integral part of a complete examination. Limited examinations for reoccurring indications may be performed as noted.  Right Findings: +----------+------------+---------+-----------+----------+--------------------+ RIGHT     CompressiblePhasicitySpontaneousProperties      Summary        +----------+------------+---------+-----------+----------+--------------------+ IJV           Full       No        Yes    pulsatile                      +----------+------------+---------+-----------+----------+--------------------+ Subclavian    Full       No        Yes    pulsatile                      +----------+------------+---------+-----------+----------+--------------------+ Axillary      Full       No        Yes    pulsatile                      +----------+------------+---------+-----------+----------+--------------------+ Brachial                 Yes       Yes                   patent by                                                              color/doppler     +----------+------------+---------+-----------+----------+--------------------+ Radial        Full                                                       +----------+------------+---------+-----------+----------+--------------------+ Ulnar                                                  Not visualized    +----------+------------+---------+-----------+----------+--------------------+ Cephalic      Full                                                       +----------+------------+---------+-----------+----------+--------------------+ Basilic  Not visualized    +----------+------------+---------+-----------+----------+--------------------+ Unable to visualized basilic or ulnar veins due to patient inability to move arm and edema. Limited visualization of cephalic and brachial due to immobility and edema, areas visualized  appear patent.  Left Findings: +----+------------+---------+-----------+----------+-----------------------+ LEFTCompressiblePhasicitySpontaneousProperties        Summary         +----+------------+---------+-----------+----------+-----------------------+ IJV                Yes       Yes              patent by color/doppler +----+------------+---------+-----------+----------+-----------------------+  Summary:  Right: No evidence of deep vein thrombosis in the upper extremity in areas visualized. No evidence of superficial vein thrombosis in the upper extremity in areas visualized. However, unable to visualize the basilic and ulnar veins.  Left: No evidence of thrombosis in the subclavian.  *See table(s) above for measurements and observations.  Diagnosing physician: Gerarda Fraction Electronically signed by Gerarda Fraction on 11/21/2023 at 5:31:48 PM.    Final     Medications: I have reviewed the patient's current medications.   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  The patient admitted with right arm pain and swelling and destructive lesion around the previous surgery with bone metastasis in the right arm.  She is expected to have another surgical intervention by her orthopedic surgeon in the next few days. I had a lengthy discussion with the patient today about her current condition and treatment options.  I specifically asked the patient about her interest in proceeding with systemic therapy versus palliative care and hospice.  The patient mentioned that she is interested in treatment but she is not ready for it at this point. I will arrange for her a  follow-up visit at the cancer center in around 2 weeks for more detailed discussion of her treatment options but if she continues to miss her appointment, we will cancel her future treatment and I think it would be appropriate for her to consider palliative care and hospice. She may benefit from seeing the palliative care team for discussion of goals of care. Thank you for allowing me to participate in the care of Regina [name].  Please call if you have any questions. Disclaimer: This note was dictated with voice recognition software. Similar sounding words can inadvertently be transcribed and may be missed upon review. Lajuana Matte, MD    LOS: 1 day    Lajuana Matte 11/23/2023

## 2023-11-23 NOTE — Progress Notes (Signed)
 Radiation Oncology         [phone]) [phone] ________________________________  Initial inpatient Consultation  Name: Regina [name] MRN: [id number]  Date: 11/23/2023  DOB: 06-21-1966  ON:GEXBMWU, Kinnie Scales, NP  Si Gaul, MD   REFERRING PHYSICIAN: Si Gaul, MD  DIAGNOSIS: C79.51   ICD-10-CM   1. 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 Metastasis to bone [facility])  C79.51      Stage IV (T1c, N3, M1 C) non-small cell lung cancer, adenocarcinoma presented with right upper lobe lung mass in addition to extensive bilateral hilar, mediastinal as well as right supraclavicular lymphadenopathy and metastatic disease to the bone in the right arm as well as brain metastasis diagnosed in January 2025.    Cancer Staging  Primary adenocarcinoma of upper lobe of right lung [facility]) Staging form: Lung, AJCC V9 - Clinical stage from 09/22/2023: Stage IVB (cT1c, cN3, cM1c2) - Signed by Si Gaul, MD on 09/22/2023 Stage prefix: Initial diagnosis Method of lymph node assessment: Clinical   CHIEF COMPLAINT: Here to discuss management of lung cancer with bone metastasis   HISTORY OF PRESENT ILLNESS:Regina [name] is a 58 y.o. female who was diagnosed with stage four non-small cell lung cancer adenocarcinoma in January 2025.   Patient has undergone resection of the brain metastasis and postoperative stereotactic radiosurgery to the resection cavity in January of 2025. She underwent right humeral nailing by [name] due to pathologic fracture on 10/10/23. She was scheduled to undergo radiation to the right humerus, but was noncompliant with scheduled follow-ups.   PET scan preformed on 10-27-23 showed an intensely hypermetabolic lobulated solid 2.6 cm peripheral apical right upper lobe pulmonary nodule, a hypermetabolic large expansile  lytic destructive right mid humerus shaft 7 cm bone metastasis. Scan also indicted a hypermetabolic medial lower left sacral bone metastasis and a low level hypermetabolism associated with faintly calcified mediastinal and bilateral hilar lymph nodes, favoring sequela of prior granulomatous disease, likely sarcoidosis. Per Dr. Asa Lente recommendations, the patient was started on Pembrolizumab 200 mg IV every 3 weeks. Her first dose was on 11/09/2023.     Most recently, she presented to the ED on 3/17 with complains of right arm pain. DG right shoulder and right humerus showed a permeative pattern in the humerus in the middle third shaft and large areas of bone destruction surrounding the hardware with findings most compatible with metastatic disease. No acute fracture or dislocation was indicated. She left AMA due to a family emergency and returned to the ED on 3/18 and was admitted for inpatient care. Dr Basilio Cairo spoke with [facility] hospitalist last night who reports that surgical team at [facility] does not recommend more surgery to humerus - only non aggressive palliative measures at this point.  She reports 10/10 right shoulder pain today and describes it as aching and throbbing. The pain radiates throughout her arm. It is immobilized in a sling today.   PREVIOUS RADIATION THERAPY: Yes   ==========DELIVERED PLANS==========  First Treatment Date: 2023-10-04 Last Treatment Date: 2023-10-06   Plan Name: Brain_SRS Site: Brain Technique: SBRT/SRT-IMRT Mode: Photon Dose Per Fraction: 8 Gy Prescribed Dose (Delivered / Prescribed): 24 Gy / 24 Gy Prescribed Fxs (Delivered / Prescribed): 3 / 3  PAST MEDICAL HISTORY:  has a past medical history of Allergy, Anxiety, Brain mass, COPD (chronic obstructive pulmonary disease) (HCC), Emphysema of lung (HCC), and Hypertension.  PAST SURGICAL HISTORY: Past Surgical History:  Procedure Laterality Date   APPLICATION OF CRANIAL NAVIGATION Left 10/07/2023    Procedure: APPLICATION OF CRANIAL NAVIGATION;  Surgeon: Coletta Memos, MD;  Location: [facility] OR;  Service: Neurosurgery;  Laterality: Left;   CRANIOTOMY Left 10/07/2023   Procedure: FRONTAL CRANIOTOMY FOR ABSCESS;  Surgeon: Coletta Memos, MD;  Location: [facility] OR;  Service: Neurosurgery;  Laterality: Left;   dental procedure     HUMERUS IM NAIL Right 10/10/2023   Procedure: INTRAMEDULLARY (IM) NAIL HUMERAL RIGHT;  Surgeon: Huel Cote, MD;  Location: [facility] OR;  Service: Orthopedics;  Laterality: Right;    FAMILY HISTORY: family history includes Hypertension in her father and mother; Leukemia in her mother.  SOCIAL HISTORY:  reports that she quit smoking about 4 years ago. Her smoking use included cigarettes. She has never used smokeless tobacco. She reports current alcohol use of about 6.0 standard drinks of alcohol per week. She reports current drug use. Frequency: 4.00 times per week. Drug: Marijuana.   ALLERGIES: Pork allergy and Bee venom MEDICATIONS: / No current facility-administered medications for this encounter.   No current outpatient medications on file.   Facility-Administered Medications Ordered in Other Encounters  Medication Dose Route Frequency Provider Last Rate Last Admin   acetaminophen (TYLENOL) tablet 650 mg  650 mg Oral Q6H PRN Doutova, Anastassia, MD       Or   acetaminophen (TYLENOL) suppository 650 mg  650 mg Rectal Q6H PRN Doutova, Anastassia, MD       amLODipine (NORVASC) tablet 5 mg  5 mg Oral Daily Doutova, Anastassia, MD   5 mg at 11/23/23 1137   HYDROcodone-acetaminophen (NORCO/VICODIN) 5-325 MG per tablet 1-2 tablet  1-2 tablet Oral Q4H PRN [name], MD       HYDROmorphone (DILAUDID) injection 0.5 mg  0.5 mg Intravenous Q4H PRN Doutova, Anastassia, MD   0.5 mg at 11/22/23 2146   ondansetron (ZOFRAN) tablet 4 mg  4 mg Oral Q6H PRN [name], MD       Or   ondansetron (ZOFRAN) injection 4 mg  4 mg Intravenous Q6H PRN Doutova, Anastassia, MD   4  mg at 11/22/23 2145   oxyCODONE (Oxy IR/ROXICODONE) immediate release tablet 5 mg  5 mg Oral Q4H PRN [name], MD   5 mg at 11/23/23 1140    REVIEW OF SYSTEMS:  Notable for that above.   PHYSICAL EXAM:  height is 5\' 6"  (1.676 m) and weight is 118 lb (53.5 kg). Her oral temperature is 98.8 F (37.1 C). Her blood pressure is 132/86 and her pulse is 82. Her respiration is 19 and oxygen saturation is 94%.   General: Alert and oriented, in significant pain HEENT: Head is normocephalic. Extraocular movements are intact.  Extremities: No cyanosis or edema. Lymphatics: see Neck Exam Skin: No concerning lesions. Musculoskeletal: right arm in a sling, right arm is swollen Neurologic: Cranial nerves II through XII are grossly intact. No obvious focalities. Speech is fluent. Coordination is intact. Psychiatric: Judgment and insight are intact. Affect is appropriate.   ECOG = 3  0 - Asymptomatic (Fully active, able to carry on all predisease activities without restriction)  1 - Symptomatic but completely ambulatory (Restricted in physically strenuous activity but ambulatory and able to carry out work of a light or sedentary nature. For example, light housework, office work)  2 - Symptomatic, <50% in bed during the day (Ambulatory and capable of all self care but unable to carry out any work activities.  Up and about more than 50% of waking hours)  3 - Symptomatic, >50% in bed, but not bedbound (Capable of only limited self-care, confined to bed or chair 50% or more of waking hours)  4 - Bedbound (Completely disabled. Cannot carry on any self-care. Totally confined to bed or chair)  5 - Death   Santiago Glad MM, Creech RH, Tormey DC, et al. [phone]). "Toxicity and response criteria of the [facility]". Am. Evlyn Clines. Oncol. 5 (6): 649-55   LABORATORY DATA:  Lab Results  Component Value Date   WBC 8.9 11/23/2023   HGB 11.2 (L) 11/23/2023   HCT 35.1 (L) 11/23/2023   MCV  88.6 11/23/2023   PLT 454 (H) 11/23/2023   CMP     Component Value Date/Time   NA 136 11/23/2023 0635   NA 130 (L) 10/19/2017 0942   K 3.3 (L) 11/23/2023 0635   CL 101 11/23/2023 0635   CO2 25 11/23/2023 0635   GLUCOSE 95 11/23/2023 0635   BUN 5 (L) 11/23/2023 0635   BUN 4 (L) 10/19/2017 0942   CREATININE 0.55 11/23/2023 0635   CREATININE 0.51 11/02/2023 0845   CALCIUM 9.4 11/23/2023 0635   PROT 6.5 11/23/2023 0635   PROT 7.6 10/19/2017 0942   ALBUMIN 3.1 (L) 11/23/2023 0635   ALBUMIN 4.6 10/19/2017 0942   AST 32 11/23/2023 0635   AST 16 11/02/2023 0845   ALT 17 11/23/2023 0635   ALT 13 11/02/2023 0845   ALKPHOS 76 11/23/2023 0635   BILITOT 0.6 11/23/2023 0635   BILITOT 0.3 11/02/2023 0845   GFRNONAA >60 11/23/2023 0635   GFRNONAA >60 11/02/2023 0845         RADIOGRAPHY: VAS [place] UPPER EXTREMITY VENOUS DUPLEX Result Date: 11/21/2023 UPPER VENOUS STUDY  Patient Name:  Regina [name]  Date of Exam:   11/21/2023 Medical Rec #: [id number]       Accession #:    [id number] Date of Birth: 1966/02/11       Patient Gender: F Patient Age:   5 years Exam Location:  [facility] Procedure:      VAS [place] UPPER EXTREMITY VENOUS DUPLEX Referring Phys: Lynden Oxford --------------------------------------------------------------------------------  Indications: Pain, and Edema Risk Factors: Recent femur fracture with surgical repair (10/10/2023), recent suture removal (11/15/2023). Comparison Study: No previous exams Performing Technologist: Jody Hill RVT, RDMS  Examination Guidelines: A complete evaluation includes B-mode imaging, spectral Doppler, color Doppler, and power Doppler as needed of all accessible portions of each vessel. Bilateral testing is considered an integral part of a complete examination. Limited examinations for reoccurring indications may be performed as noted.  Right Findings: +----------+------------+---------+-----------+----------+--------------------+ RIGHT      CompressiblePhasicitySpontaneousProperties      Summary        +----------+------------+---------+-----------+----------+--------------------+ IJV           Full       No        Yes    pulsatile                      +----------+------------+---------+-----------+----------+--------------------+ Subclavian    Full       No        Yes    pulsatile                      +----------+------------+---------+-----------+----------+--------------------+ Axillary      Full       No        Yes    pulsatile                      +----------+------------+---------+-----------+----------+--------------------+  Brachial                 Yes       Yes                   patent by                                                              color/doppler     +----------+------------+---------+-----------+----------+--------------------+ Radial        Full                                                       +----------+------------+---------+-----------+----------+--------------------+ Ulnar                                                  Not visualized    +----------+------------+---------+-----------+----------+--------------------+ Cephalic      Full                                                       +----------+------------+---------+-----------+----------+--------------------+ Basilic                                                Not visualized    +----------+------------+---------+-----------+----------+--------------------+ Unable to visualized basilic or ulnar veins due to patient inability to move arm and edema. Limited visualization of cephalic and brachial due to immobility and edema, areas visualized appear patent.  Left Findings: +----+------------+---------+-----------+----------+-----------------------+ LEFTCompressiblePhasicitySpontaneousProperties        Summary          +----+------------+---------+-----------+----------+-----------------------+ IJV                Yes       Yes              patent by color/doppler +----+------------+---------+-----------+----------+-----------------------+  Summary:  Right: No evidence of deep vein thrombosis in the upper extremity in areas visualized. No evidence of superficial vein thrombosis in the upper extremity in areas visualized. However, unable to visualize the basilic and ulnar veins.  Left: No evidence of thrombosis in the subclavian.  *See table(s) above for measurements and observations.  Diagnosing physician: Gerarda Fraction Electronically signed by Gerarda Fraction on 11/21/2023 at 5:31:48 PM.    Final    DG Shoulder Right Result Date: 11/21/2023 CLINICAL DATA:  Right arm pain and swelling. EXAM: RIGHT HUMERUS - 2+ VIEW; RIGHT SHOULDER - 2+ VIEW COMPARISON:  None Available. FINDINGS: No acute fracture or dislocation. Right humerus ORIF noted fixated with 2 threaded screw proximally and 1 threaded screw distally. The hardware is intact. No periprosthetic fracture or lucency. However, there is permeative pattern in the humerus in the middle third shaft and there are large areas of bone  destruction surrounding the hardware. Glenohumeral and acromioclavicular joints are normal in alignment and exhibit minimal degenerative changes. No soft tissue swelling. No radiopaque foreign bodies. IMPRESSION: *No acute fracture or dislocation. *Permeative pattern in the humerus in the middle third shaft and large areas of bone destruction surrounding the hardware. Findings are compatible with metastatic disease. Electronically Signed   By: Jules Schick M.D.   On: 11/21/2023 15:51   DG Humerus Right Result Date: 11/21/2023 CLINICAL DATA:  Right arm pain and swelling. EXAM: RIGHT HUMERUS - 2+ VIEW; RIGHT SHOULDER - 2+ VIEW COMPARISON:  None Available. FINDINGS: No acute fracture or dislocation. Right humerus ORIF noted fixated with 2 threaded  screw proximally and 1 threaded screw distally. The hardware is intact. No periprosthetic fracture or lucency. However, there is permeative pattern in the humerus in the middle third shaft and there are large areas of bone destruction surrounding the hardware. Glenohumeral and acromioclavicular joints are normal in alignment and exhibit minimal degenerative changes. No soft tissue swelling. No radiopaque foreign bodies. IMPRESSION: *No acute fracture or dislocation. *Permeative pattern in the humerus in the middle third shaft and large areas of bone destruction surrounding the hardware. Findings are compatible with metastatic disease. Electronically Signed   By: Jules Schick M.D.   On: 11/21/2023 15:51   NM PET Image Initial (PI) Skull Base To Thigh (F-18 FDG) Result Date: 10/28/2023 CLINICAL DATA:  Initial treatment strategy for stage IV non-small cell lung cancer with pathologic right humerus fracture post fixation and with brain metastasis. EXAM: NUCLEAR MEDICINE PET SKULL BASE TO THIGH TECHNIQUE: 6.6 mCi F-18 FDG was injected intravenously. Full-ring PET imaging was performed from the skull base to thigh after the radiotracer. CT data was obtained and used for attenuation correction and anatomic localization. Fasting blood glucose: 94 mg/dl COMPARISON:  78/29/5621 CT chest, abdomen and pelvis. FINDINGS: Mediastinal blood pool activity: SUV max 2.0 Liver activity: SUV max NA NECK: No hypermetabolic lymph nodes in the neck. Incidental CT findings: None. CHEST: Intensely hypermetabolic lobulated solid 2.6 x 2.0 cm peripheral apical right upper lobe pulmonary nodule with max SUV 13.6 (series 7/image 14). No additional hypermetabolic pulmonary findings. Low level hypermetabolism associated with faintly calcified paratracheal, AP window, subcarinal and bilateral hilar lymph nodes. Representative faintly calcified 1.4 cm right paratracheal node with max SUV 3.6 (series 4/image 56). Right hilar adenopathy with max  SUV 3.1 and left hilar adenopathy with max SUV 2.8. Incidental CT findings: Trace layering right pleural effusion. Moderate paraseptal and centrilobular emphysema with mild diffuse bronchial wall thickening. Patchy reticulation and apparent honeycombing at the lung bases. Atherosclerotic nonaneurysmal thoracic aorta. ABDOMEN/PELVIS: No abnormal hypermetabolic activity within the liver, pancreas, adrenal glands, or spleen. No hypermetabolic lymph nodes in the abdomen or pelvis. Incidental CT findings: Atherosclerotic nonaneurysmal abdominal aorta. Calcified top-normal size uterus with presumed small degenerative fibroids. SKELETON: Hypermetabolic medial lower left sacral lesion with max SUV 12.9, CT occult. Large hypermetabolic expansile lytic destructive right mid humerus shaft 7 cm lesion with max SUV 18.0 status post transfixation of pathologic right mid humeral shaft fracture with intramedullary rod with proximal and distal interlocking screws. Incidental CT findings: None. IMPRESSION: 1. Intensely hypermetabolic lobulated solid 2.6 cm peripheral apical right upper lobe pulmonary nodule, compatible with primary bronchogenic malignancy. 2. Hypermetabolic large expansile lytic destructive right mid humerus shaft 7 cm bone metastasis status post transfixation of pathologic right mid humerus shaft fracture. 3. Hypermetabolic medial lower left sacral bone metastasis, CT occult. 4. Low level hypermetabolism associated with faintly  calcified mediastinal and bilateral hilar lymph nodes, favoring sequela of prior granulomatous disease, likely sarcoidosis. 5. Trace layering right pleural effusion. 6. Aortic Atherosclerosis (ICD10-I70.0) and Emphysema (ICD10-J43.9). Electronically Signed   By: Delbert Phenix M.D.   On: 10/28/2023 11:24      IMPRESSION/PLAN:  Right humeral metastasis from stage IV (T1c, N3, M1C) non-small cell lung cancer; s/p right humeral nailing on 10/10/2023  We personally reviewed this patient's  case. Most recent xray demonstrates severe metastatic disease within the right humerus. She is unfortunately experiencing significant pain from this site and would likely benefit from palliative radiation to this area.   Today, we talked to the patient about the findings and work-up thus far.  We discussed the natural history of bony metastases and general treatment, highlighting the role of radiotherapy in the management.  We discussed the available radiation techniques, and focused on the details of logistics and delivery.  We reviewed the anticipated acute and late sequelae associated with radiation in this setting.  The patient was encouraged to ask questions that I answered to the best of my ability. A patient consent form was discussed and signed.  We retained a copy for our records. She is scheduled for CT simulation later today.   Of note, patient has a history of missing outpatient appointments. Dr. Basilio Cairo anticipates a course of 13 Gy in 2 fractions to the entire right humerus to be completed inpatient, ensuring no gaps in her treatment. Her first treatment is scheduled for tomorrow, 11/24/2023.    On date of service, in total, we spent 60 minutes on this encounter. Patient was seen in person.   __________________________________________    Bryan Lemma, PA-C   Lonie Peak, MD    [facility]  Radiation Oncology Direct Dial: [phone]  Fax: [phone] [email or web address]   This document serves as a record of services personally performed by Lonie Peak, MD. It was created on her behalf by Herbie Saxon, a trained medical scribe. The creation of this record is based on the scribe's personal observations and the provider's statements to them. This document has been checked and approved by the attending provider.

## 2023-11-24 ENCOUNTER — Other Ambulatory Visit: Payer: Self-pay

## 2023-11-24 ENCOUNTER — Ambulatory Visit
Admit: 2023-11-24 | Discharge: 2023-11-24 | Disposition: A | Discharge status: Home / Self Care | Payer: MEDICAID | Attending: Radiation Oncology | Admitting: Radiation Oncology

## 2023-11-24 ENCOUNTER — Telehealth: Payer: Self-pay

## 2023-11-24 DIAGNOSIS — Z7189 Other specified counseling: Secondary | ICD-10-CM | POA: Diagnosis not present

## 2023-11-24 DIAGNOSIS — Z515 Encounter for palliative care: Secondary | ICD-10-CM

## 2023-11-24 DIAGNOSIS — R52 Pain, unspecified: Secondary | ICD-10-CM | POA: Diagnosis not present

## 2023-11-24 DIAGNOSIS — C3490 Malignant neoplasm of unspecified part of unspecified bronchus or lung: Secondary | ICD-10-CM | POA: Diagnosis not present

## 2023-11-24 DIAGNOSIS — E876 Hypokalemia: Secondary | ICD-10-CM | POA: Diagnosis not present

## 2023-11-24 DIAGNOSIS — J42 Unspecified chronic bronchitis: Secondary | ICD-10-CM | POA: Diagnosis not present

## 2023-11-24 DIAGNOSIS — M84521A Pathological fracture in neoplastic disease, right humerus, initial encounter for fracture: Secondary | ICD-10-CM | POA: Diagnosis not present

## 2023-11-24 LAB — COMPREHENSIVE METABOLIC PANEL
ALT: 14 U/L (ref 0–44)
AST: 25 U/L (ref 15–41)
Albumin: 3 g/dL — ABNORMAL LOW (ref 3.5–5.0)
Alkaline Phosphatase: 75 U/L (ref 38–126)
Anion gap: 12 (ref 5–15)
BUN: <5 mg/dL — ABNORMAL LOW (ref 6–20)
CO2: 24 mmol/L (ref 22–32)
Calcium: 9.4 mg/dL (ref 8.9–10.3)
Chloride: 97 mmol/L — ABNORMAL LOW (ref 98–111)
Creatinine, Ser: 0.58 mg/dL (ref 0.44–1.00)
GFR, Estimated: >60 mL/min (ref >60)
Glucose, Bld: 79 mg/dL (ref 70–99)
Potassium: 2.7 mmol/L — CL (ref 3.5–5.1)
Sodium: 133 mmol/L — ABNORMAL LOW (ref 135–145)
Total Bilirubin: 0.7 mg/dL (ref 0.0–1.2)
Total Protein: 6.4 g/dL — ABNORMAL LOW (ref 6.5–8.1)

## 2023-11-24 LAB — CBC WITH DIFFERENTIAL/PLATELET
Abs Immature Granulocytes: 0.06 10*3/uL (ref 0.00–0.07)
Basophils Absolute: 0.1 10*3/uL (ref 0.0–0.1)
Basophils Relative: 1 %
Eosinophils Absolute: 0.6 10*3/uL — ABNORMAL HIGH (ref 0.0–0.5)
Eosinophils Relative: 6 %
HCT: 36.4 % (ref 36.0–46.0)
Hemoglobin: 12.1 g/dL (ref 12.0–15.0)
Immature Granulocytes: 1 %
Lymphocytes Relative: 38 %
Lymphs Abs: 3.8 10*3/uL (ref 0.7–4.0)
MCH: 29 pg (ref 26.0–34.0)
MCHC: 33.2 g/dL (ref 30.0–36.0)
MCV: 87.3 fL (ref 80.0–100.0)
Monocytes Absolute: 0.8 10*3/uL (ref 0.1–1.0)
Monocytes Relative: 8 %
Neutro Abs: 4.7 10*3/uL (ref 1.7–7.7)
Neutrophils Relative %: 46 %
Platelets: 480 10*3/uL — ABNORMAL HIGH (ref 150–400)
RBC: 4.17 MIL/uL (ref 3.87–5.11)
RDW: 15.2 % (ref 11.5–15.5)
WBC: 10 10*3/uL (ref 4.0–10.5)
nRBC: 0 % (ref 0.0–0.2)

## 2023-11-24 LAB — RAD ONC ARIA SESSION SUMMARY
Course Elapsed Days: 0
Plan Fractions Treated to Date: 1
Plan Prescribed Dose Per Fraction: 6.5 Gy
Plan Total Fractions Prescribed: 2
Plan Total Prescribed Dose: 13 Gy
Reference Point Dosage Given to Date: 6.5 Gy
Reference Point Session Dosage Given: 6.5 Gy
Session Number: 1

## 2023-11-24 LAB — PHOSPHORUS: Phosphorus: 3.1 mg/dL (ref 2.5–4.6)

## 2023-11-24 LAB — MAGNESIUM: Magnesium: 1.5 mg/dL — ABNORMAL LOW (ref 1.7–2.4)

## 2023-11-24 MED ORDER — MAGNESIUM SULFATE 4 GM/100ML IV SOLN
4.0000 g | Freq: Once | INTRAVENOUS | Status: AC
  Administered 2023-11-24: 4 g via INTRAVENOUS
  Filled 2023-11-24: qty 100

## 2023-11-24 MED ORDER — POTASSIUM CHLORIDE CRYS ER 20 MEQ PO TBCR
40.0000 meq | EXTENDED_RELEASE_TABLET | ORAL | Status: AC
  Administered 2023-11-24 (×3): 40 meq via ORAL
  Filled 2023-11-24 (×3): qty 2

## 2023-11-24 NOTE — Telephone Encounter (Signed)
 In error

## 2023-11-24 NOTE — Progress Notes (Signed)
 PROGRESS NOTE    Audrionna Lampton  ION:[id number] DOB: 1966-06-27 DOA: 11/22/2023 PCP: [name], NP  Chief Complaint  Patient presents with   Arm Injury    Hospital Course:  Regina [name] is 58 y.o. 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  She presents on this admission due to right arm pain and swelling.  She recently had sutures removed on 3/11 when she saw PA in the orthopedic office and complained of pain and swelling.  During that visit she was initiated on antibiotics and oxycodone.  Due to ongoing pain she presented in the ER on 3/17.  X-ray at that time revealed bony destruction around the hardware with metastatic disease.  EDP discussed directly with orthopedic surgeon [name], who suggested the distal screw is backing out due to progression of the cancer.  He requested transfer to [facility] for multi special management, which was ultimately approved however patient eloped from the ED prior to admission.  Patient reports her elopement was secondary to a family emergency and ultimately returned to the ED on 3/18 due to worsening pain.  Admitting physician again discussed with [facility] who now feels that the patient is no longer a candidate for aggressive interventions and recommends palliative care consult with radiation oncology.  The case was then discussed with Dr. Cherly Hensen of oncology who recommended palliative care consult and rad/onc.  Patient was admitted for pain management   Subjective: Evaluated patient after her radiation treatment today.  She reports pain is improved.  We discussed treatment options.  We discussed chemotherapy.  We discussed hospice.  Overall patient appears dejected and does not want a participate in evaluation or discussions.   Objective: Vitals:   11/23/23 0404 11/23/23 1340 11/23/23 2214 11/24/23 0447  BP: 128/72 132/74  139/82 134/79  Pulse: 79 87 85 84  Resp: 18 16 18 18   Temp: 99 F (37.2 C) 99.1 F (37.3 C) 98.7 F (37.1 C) 98.7 F (37.1 C)  TempSrc: Oral Oral Oral Oral  SpO2: 90% 91% 94% 91%  Weight:      Height:        Intake/Output Summary (Last 24 hours) at 11/24/2023 0914 Last data filed at 11/23/2023 1300 Gross per 24 hour  Intake 220 ml  Output --  Net 220 ml   Filed Weights   11/22/23 1306 11/22/23 2043  Weight: 55.8 kg 53.9 kg    Examination: General exam: Appears calm and comfortable, NAD  Respiratory system: No work of breathing, symmetric chest wall expansion Cardiovascular system: S1 & S2 heard, RRR.  Gastrointestinal system: Abdomen is nondistended, soft and nontender.  Neuro: Alert and oriented. No focal neurological deficits. Extremities: right Upper arm, swollen, diffusely tender to palpation. Skin: No rashes, lesions Psychiatry: Demonstrates appropriate judgement and insight. Mood & affect appropriate for situation.   Assessment & Plan:  Principal Problem:   Pathologic fracture Active Problems:   HTN (hypertension)   COPD (chronic obstructive pulmonary disease) (HCC)   Lung cancer metastatic to brain (HCC)   Hypokalemia   Intractable pain - Secondary to malignancy - Continue with IV opioids, will attempt to taper these medications as tolerated. - Palliative care team consulted to assist in symptom management  Pathologic fracture of right humerus - IM nail 2/3, was loss to follow-up.  Suture removal 3/11.  There was concern for superimposed infection at that time. - Doppler  on arrival negative for DVT. 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  Extensive bilateral hilar, mediastinal, supraclavicular lymphadenopathy and metastatic disease to bone and right arm as well as brain metastasis.  Initially diagnosed January 2025.  Was meant to  initiate chemotherapy in March 2025 but has not followed up. - Oncology consulted, will plan for outpatient follow-up though at this time patient reports she is not ready for chemotherapy but is also not interested in hospice.  I had extensive discussion with the patient regarding this today.  We discussed that chemotherapy is not her only option and she can consider hospice or palliative care if that is more in line with her goals.  Ultimately the patient was dismissive and did not want to have further conversations regarding this topic.  I reminded the patient that untreated cancer will continue to spread and she may not have the same options if she waiy treatment for another few months.  She endorses understanding this. - Radiation treatment initiated - first treatment today - Appreciate palliative care team's ongoing discussions.  COPD, currently in exacerbation - On room air, continue to monitor oxygen saturations - No wheeze - As needed nebs  Hypertension - Continue home meds  Hypokalemia - Cont to replace as needed  DVT prophylaxis: heparin   Code Status: Full Code Family Communication:  Discussed directly with patient Disposition:  Inpatient still hospitalized for pain control, will discharge to home with [facility] when stable  Consultants:  Treatment Team:  Consulting Physician: Si Gaul, MD  Procedures:    Antimicrobials:  Anti-infectives (From admission, onward)    None       Data Reviewed: I have personally reviewed following labs and imaging studies CBC: Recent Labs  Lab 11/21/23 1435 11/22/23 1635 11/23/23 0635 11/24/23 0610  WBC 9.4 10.4 8.9 10.0  NEUTROABS 4.5 4.2  --  4.7  HGB 13.3 11.8* 11.2* 12.1  HCT 40.0 35.3* 35.1* 36.4  MCV 87.3 86.1 88.6 87.3  PLT 487* 492* 454* 480*   Basic Metabolic Panel: Recent Labs  Lab 11/21/23 1435 11/22/23 1635 11/22/23 2058 11/23/23 0635 11/24/23 0610  NA 137 131*  --  136 133*  K 2.8* 2.7*  --  3.3* 2.7*   CL 98 93*  --  101 97*  CO2 26 26  --  25 24  GLUCOSE 92 103*  --  95 79  BUN <5* 7  --  5* <5*  CREATININE 0.64 0.66  --  0.55 0.58  CALCIUM 10.7* 9.9  --  9.4 9.4  MG  --  1.7  --  1.7 1.5*  PHOS  --   --  3.4 3.0 3.1   GFR: Estimated Creatinine Clearance: 64.4 mL/min (by C-G formula based on SCr of 0.58 mg/dL). Liver Function Tests: Recent Labs  Lab 11/21/23 1435 11/23/23 0635 11/24/23 0610  AST 39 32 25  ALT 20 17 14   ALKPHOS 95 76 75  BILITOT 0.4 0.6 0.7  PROT 8.0 6.5 6.4*  ALBUMIN 3.7 3.1* 3.0*   CBG: No results for input(s): "GLUCAP" in the last 168 hours.  Recent Results (from the past 240 hours)  Blood culture (routine x 2)     Status: None (Preliminary result)   Collection Time: 11/21/23  2:35 PM   Specimen: BLOOD  Result Value Ref Range Status   Specimen Description   Final  BLOOD LEFT ANTECUBITAL Performed at [facility], [address], [place], [place] [address]    Special Requests   Final    BOTTLES DRAWN AEROBIC AND ANAEROBIC Blood Culture results may not be optimal due to an inadequate volume of blood received in culture bottles Performed at [facility], [address] [place], [place] [address]    Culture   Final    NO GROWTH 3 DAYS Performed at [facility], [address], [place], [place] [address]    Report Status PENDING  Incomplete     Radiology Studies: No results found.   Scheduled Meds:  amLODipine  5 mg Oral Daily   potassium chloride  40 mEq Oral Q4H   Continuous Infusions:  magnesium sulfate bolus IVPB       LOS: 2 days  MDM: Patient is high risk for one or more organ failure.  They necessitate ongoing hospitalization for continued IV therapies and subsequent lab monitoring. Total time spent interpreting labs and vitals, coordinating care amongst consultants and care team members, directly assessing and discussing care with the patient and/or family: 55 min    Debarah Crape, DO [facility]  To contact the attending physician between 7A-7P please use Epic Chat. To contact the covering physician during after hours 7P-7A, please review Amion.   11/24/2023, 9:14 AM   *This document has been created with the assistance of dictation software. Please excuse typographical errors. *

## 2023-11-24 NOTE — Progress Notes (Signed)
 Chaplain attempted to visit as requested, however patient stated it was not a good time for her and if possible can chaplain return this afternoon.

## 2023-11-24 NOTE — Plan of Care (Signed)

## 2023-11-24 NOTE — Evaluation (Signed)
 Physical Therapy Evaluation Only Patient Details Name: Regina [name] MRN: [id number] DOB: 05-27-1966 Today's Date: 11/24/2023  History of Present Illness  Regina [name] is a 58 y.o. female presents with R arm pain 11/22/23. 3/17 xray showing bony destruction around hardware metastatic disease. Of note, admitted to [facility] on 10/01/2023 due to difficulty speaking; had XRT for brain tumor, then transferred to [facility] on 10/06/23 and underwent frontal craniotomy on 10/07/23 due to brain mass/abcess.  Pt was hospitalized 1/11-1/15/2025 secondary to metastatic lung ca to brain and R UE weakness with pathologic humerus fx.  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 On eval, pt appears understandably emotional, verbalizes she is processing medical information, health care decision making and personal family matters- offered active listening and pt in agreement with chaplain consult. Pt ind dons and adjusts R UE sling appropriately. On eval, pt ind with mobility, no physical assist or cues, safely navigating room set up and hallway obstacles without difficulty or unsteadiness. No acute PT needs identified, no post-acute PT needs, will sign off at this time.      If plan is discharge home, recommend the following:     Can travel by private vehicle        Equipment Recommendations None recommended by PT  Recommendations for Other Services       Functional Status Assessment Patient has not had a recent decline in their functional status     Precautions / Restrictions Precautions Precaution/Restrictions Comments: pt has R sling on Restrictions Other Position/Activity Restrictions: per 2/6 ortho note from previous admission "She may use her right arm back as tolerated for wall climbs and  active and active assisted range of motion as tolerated.  She may perform any ADLs.  She may begin strengthening as she can tolerate"      Mobility  Bed Mobility Overal bed mobility: Independent                  Transfers Overall transfer level: Independent Equipment used: None                    Ambulation/Gait Ambulation/Gait assistance: Independent Gait Distance (Feet): 250 Feet Assistive device: None Gait Pattern/deviations: WFL(Within Functional Limits) Gait velocity: WFL     General Gait Details: pt ambulating without AD, completing 180 degree turns, head turns R/L, speed changes, sudden stops, navigating around obstacles in hallway, no LOB note, no drifting/veering in gait path  Stairs            Wheelchair Mobility     Tilt Bed    Modified Rankin (Stroke Patients Only)       Balance Overall balance assessment: No apparent balance deficits (not formally assessed)                                           Pertinent Vitals/Pain Pain Assessment Pain Assessment: 0-10 Pain Score:  ("11") Pain Descriptors / Indicators: Grimacing, Guarding, Discomfort Pain Intervention(s): Monitored during session, Limited activity within patient's tolerance, Repositioned    Home Living Family/patient expects to be discharged to:: Private residence Living Arrangements: Other relatives Available Help at Discharge: Family;Available PRN/intermittently Type of Home:  Apartment Home Access: Level entry       Home Layout: One level Home Equipment: None Additional Comments: 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, reports providing mostly supv and no physical assistance; pt reports her brother is with her aunt today while she is in hospital    Prior Function Prior Level of Function : Independent/Modified Independent;Working/employed;Driving             Mobility Comments: no AD ADLs Comments: works for [employer], 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     Extremity/Trunk Assessment   Upper Extremity Assessment Upper Extremity Assessment: Defer to OT evaluation    Lower Extremity Assessment Lower Extremity Assessment: Overall WFL for tasks assessed    Cervical / Trunk Assessment Cervical / Trunk Assessment: Normal  Communication   Communication Communication: No apparent difficulties    Cognition Arousal: Alert Behavior During Therapy: WFL for tasks assessed/performed   PT - Cognitive impairments: No apparent impairments                       PT - Cognition Comments: pt pleasant, follows commands; appears emotional regarding conversation with medical provider, intermittent crying Following commands: Intact       Cueing       General Comments      Exercises     Assessment/Plan    PT Assessment Patient does not need any further PT services  PT Problem List         PT Treatment Interventions      PT Goals (Current goals can be found in the Care Plan section)  Acute Rehab PT Goals Patient Stated Goal: "I just want to go home" PT Goal Formulation: All assessment and education complete, DC therapy    Frequency       Co-evaluation               AM-PAC PT "6 Clicks" Mobility  Outcome Measure Help needed turning from your back to your side while in a flat bed without using bedrails?: None Help needed moving from lying on your back to sitting on the side of a flat bed without using bedrails?: None Help needed moving to and from a bed to a chair (including a wheelchair)?: None Help needed standing up from a chair using your arms (e.g., wheelchair or bedside chair)?: None Help needed to walk in hospital room?: None Help needed climbing 3-5 steps with a railing? : None 6 Click Score: 24    End of Session   Activity Tolerance: Patient tolerated treatment well Patient left: in bed;with call bell/phone within reach Nurse Communication: Mobility status;Other (comment) (chaplain  request) PT Visit Diagnosis: Pain Pain - Right/Left: Right Pain - part of body: Shoulder    Time: 1030-1048 PT Time Calculation (min) (ACUTE ONLY): 18 min   Charges:   PT Evaluation $PT Eval Low Complexity: 1 Low   PT General Charges $$ ACUTE PT VISIT: 1 Visit          Tori Vivianna Piccini PT, DPT 11/24/23, 11:12 AM

## 2023-11-24 NOTE — Consult Note (Signed)
 Consultation Note Date: 11/24/2023   Patient Name: Regina [name]  DOB: May 19, 1966  MRN: [id number]  Age / Sex: 58 y.o., female  PCP: [name], NP Referring Physician: Debarah Crape, DO  Reason for Consultation: Establishing goals of care  HPI/Patient Profile: 58 y.o. female  with past medical history of non-small cell lung cancer admitted on 11/22/2023 with right arm pain and swelling.   Clinical Assessment and Goals of Care: 58 year old lady who is under the care of Dr. With a life limiting illness of non-small cell lung cancer, upper lobe lung mass extensive bilateral hilar mediastinal and supraclavicular adenopathy metastatic disease to the bone as well as brain diagnosed January 2025.  Patient has been admitted for arm pain, remains admitted to hospital medicine service and is being seen by medical oncology Palliative consult for CODE STATUS and broad goals of care discussions has been requested. Chart reviewed, oncologic history reviewed. Palliative medicine is specialized medical care for people living with serious illness. It focuses on providing relief from the symptoms and stress of a serious illness. The goal is to improve quality of life for both the patient and the family. Goals of care: Broad aims of medical therapy in relation to the patient's values and preferences. Our aim is to provide medical care aimed at enabling patients to achieve the goals that matter most to them, given the circumstances of their particular medical situation and their constraints.  Patient has advance care planning documents on the chart.  She has completed a living will designating her needs to be her HCPOA agent.  Additionally, she has requested for full CODE STATUS on her living will. Briefly introduced myself and palliative care.  Patient immediately stops me and states that she already knows what I am here  to say and she states that she does not believe she is ready to hear it.  Patient does not wish to communicate further.  HCPOA Niece  SUMMARY OF RECOMMENDATIONS   Full code full scope as per patient's previously expressed wishes noted on completed advance care planning documents that are scanned on the chart-living will.  This also designates her niece Ms. Crawford as her Geographical information systems officer. Recommend close follow-up with medical oncology Dr. Shirline Frees at [facility], recommend that the patient consider being under the care of of palliative colleague Ms. Cousar, NP at [facility]. Otherwise, continue current mode of care. Thank you for the consult.  Code Status/Advance Care Planning: Full code   Symptom Management:  Continue current mode of care  Palliative Prophylaxis:  Frequent Pain Assessment  Additional Recommendations (Limitations, Scope, Preferences): Full Scope Treatment  Psycho-social/Spiritual:  Desire for further Chaplaincy support:yes Additional Recommendations: Caregiving  Support/Resources  Prognosis:  Unable to determine  Discharge Planning: To Be Determined      Primary Diagnoses: Present on Admission:  Pathologic fracture  COPD (chronic obstructive pulmonary disease) (HCC)  HTN (hypertension)  Lung cancer metastatic to brain (HCC)  Hypokalemia   I have reviewed the medical record, interviewed the  patient and family, and examined the patient. The following aspects are pertinent.  Past Medical History:  Diagnosis Date   Allergy    Anxiety    Brain mass    COPD (chronic obstructive pulmonary disease) (HCC)    Emphysema of lung (HCC)    Hypertension    Social History   Socioeconomic History   Marital status: Widowed    Spouse name: Not on file   Number of children: 1    Years of education: 1    Highest education level: Not on file  Occupational History   Occupation: Set designer: [employer]     Comment: Nurse, mental health   Tobacco Use   Smoking status: Former    Current packs/day: 0.00    Types: Cigarettes    Quit date: 10/31/2019    Years since quitting: 4.0   Smokeless tobacco: Never  Vaping Use   Vaping status: Never Used  Substance and Sexual Activity   Alcohol use: Yes    Alcohol/week: 6.0 standard drinks of alcohol    Types: 6 Cans of beer per week   Drug use: Yes    Frequency: 4.0 times per week    Types: Marijuana   Sexual activity: Not Currently  Other Topics Concern   Not on file  Social History Narrative   Lives with her brother.    Son lives near by, 24 yo, married.    Social Drivers of Corporate investment banker Strain: Not on file  Food Insecurity: No Food Insecurity (11/22/2023)   Hunger Vital Sign    Worried About Running Out of Food in the Last Year: Never true    Ran Out of Food in the Last Year: Never true  Transportation Needs: No Transportation Needs (11/22/2023)   PRAPARE - Administrator, Civil Service (Medical): No    Lack of Transportation (Non-Medical): No  Physical Activity: Not on file  Stress: Not on file  Social Connections: Not on file   Family History  Problem Relation Age of Onset   Leukemia Mother    Hypertension Mother    Hypertension Father    Cancer Neg Hx    Diabetes Neg Hx    Heart disease Neg Hx    Colon cancer Neg Hx    Esophageal cancer Neg Hx    Rectal cancer Neg Hx    Stomach cancer Neg Hx    Scheduled Meds:  amLODipine  5 mg Oral Daily   potassium chloride  40 mEq Oral Q4H   Continuous Infusions: PRN Meds:.acetaminophen **OR** acetaminophen, HYDROcodone-acetaminophen, HYDROmorphone (DILAUDID) injection, ondansetron **OR** ondansetron (ZOFRAN) IV, oxyCODONE Medications Prior to Admission:  Prior to Admission medications   Medication Sig Start Date End Date Taking? Authorizing Provider  acetaminophen (TYLENOL) 325 MG tablet Take 2 tablets (650 mg total) by mouth every 6 (six) hours as needed for  mild pain (pain score 1-3) (or Fever >/= 101). 09/20/23  Yes Azucena Fallen, MD  amLODipine (NORVASC) 5 MG tablet Take 1 tablet (5 mg total) by mouth daily. 11/01/23  Yes [name], NP  nutrition supplement, JUVEN, (JUVEN) PACK Take 1 packet by mouth 2 (two) times daily between meals. Patient taking differently: Take 1 packet by mouth 2 (two) times daily as needed. 10/14/23 11/22/23 Yes Coletta Memos, MD  oxyCODONE (ROXICODONE) 5 MG immediate release tablet Take 1 tablet (5 mg total) by mouth every 4 (four) hours as needed for severe pain (pain score  7-10) or breakthrough pain. 11/15/23  Yes Overturf, Lyman Speller, PA-C  levETIRAcetam (KEPPRA) 500 MG tablet Take 1 tablet (500 mg total) by mouth 2 (two) times daily. Patient not taking: Reported on 11/22/2023 09/20/23   Azucena Fallen, MD  lidocaine-prilocaine (EMLA) cream Apply to affected area once Patient not taking: Reported on 11/22/2023 11/02/23   Si Gaul, MD  ondansetron (ZOFRAN) 8 MG tablet Take 1 tablet (8 mg total) by mouth every 8 (eight) hours as needed for nausea or vomiting. Patient not taking: Reported on 11/22/2023 11/02/23   Si Gaul, MD  prochlorperazine (COMPAZINE) 10 MG tablet Take 1 tablet (10 mg total) by mouth every 6 (six) hours as needed for nausea or vomiting. Patient not taking: Reported on 11/22/2023 11/02/23   Si Gaul, MD   Allergies  Allergen Reactions   Pork Allergy Nausea And Vomiting   Bee Venom Hives   Review of Systems Denies complaints Physical Exam Appears to be resting comfortably Appears with generalized weakness  Vital Signs: BP 125/68 (BP Location: Left Arm)   Pulse 85   Temp 98.2 F (36.8 C) (Oral)   Resp 17   Ht 5\' 6"  (1.676 m)   Wt 53.9 kg   LMP 06/06/2010   SpO2 (!) 87%   BMI 19.18 kg/m  Pain Scale: 0-10   Pain Score: 9    SpO2: SpO2: (!) 87 % O2 Device:SpO2: (!) 87 % O2 Flow Rate: .   IO: Intake/output summary:  Intake/Output Summary (Last 24  hours) at 11/24/2023 1625 Last data filed at 11/24/2023 1325 Gross per 24 hour  Intake 240 ml  Output --  Net 240 ml    LBM:   Baseline Weight: Weight: 55.8 kg Most recent weight: Weight: 53.9 kg     Palliative Assessment/Data:   Palliative performance scale 60%  Time In: 1530 Time Out:   1630 Time Total: 60 Greater than 50%  of this time was spent counseling and coordinating care related to the above assessment and plan.  Signed by: Rosalin Hawking, MD   Please contact Palliative Medicine Team phone at [phone] for questions and concerns.  For individual provider: See Loretha Stapler

## 2023-11-24 NOTE — Progress Notes (Signed)
 Chaplain attempted to visit this afternoon, but patient was still resting.

## 2023-11-24 NOTE — Progress Notes (Signed)
  Progress Note   Date: 11/24/2023  Patient Name: Regina [name]        MRN#: [id number]  Review the patient's clinical findings supports the diagnosis of:   Hyponatremia present on arrival

## 2023-11-24 NOTE — TOC Initial Note (Signed)
 Transition of Care [facility]) - Initial/Assessment Note    Patient Details  Name: Regina [name] MRN: [id number] Date of Birth: 1966-02-26  Transition of Care [facility]) CM/SW Contact:    Beckie Busing, RN Phone Number:[phone]  11/24/2023, 4:27 PM  Clinical Narrative:                 TOC following patient with high rick for readmission. Patient states that she is from home with brother and son. She states that she was functioning independently prior to admission. Patient is avoidant in answering questions and closes eyes. Patient states there is nothing that can be done to help her. Cm offered chaplin services but patient states they have already been and will be returning.  by and TOC will continue to follow. Currently there are no TOC needs.   Expected Discharge Plan: Home/Self Care Barriers to Discharge: Continued Medical Work up   Patient Goals and CMS Choice Patient states their goals for this hospitalization and ongoing recovery are:: "No one can do anything to hellp has no goal"   Choice offered to / list presented to : NA      Expected Discharge Plan and Services In-house Referral: NA Discharge Planning Services: CM Consult Post Acute Care Choice: NA Living arrangements for the past 2 months: Apartment                 DME Arranged: N/A DME Agency: NA       HH Arranged: NA HH Agency: NA        Prior Living Arrangements/Services Living arrangements for the past 2 months: Apartment Lives with:: Siblings, Adult Children Patient language and need for interpreter reviewed:: Yes Do you feel safe going back to the place where you live?: Yes      Need for Family Participation in Patient Care: Yes (Comment) Care giver support system in place?: Yes (comment) Current home services:  (n/a) Criminal Activity/Legal Involvement Pertinent to Current Situation/Hospitalization: No - Comment as needed  Activities of Daily Living   ADL Screening (condition at time of  admission) Independently performs ADLs?: Yes (appropriate for developmental age) Is the patient deaf or have difficulty hearing?: No Does the patient have difficulty seeing, even when wearing glasses/contacts?: No Does the patient have difficulty concentrating, remembering, or making decisions?: No  Permission Sought/Granted Permission sought to share information with : Family Supports Permission granted to share information with : No              Emotional Assessment Appearance:: Appears stated age Attitude/Demeanor/Rapport: Guarded Affect (typically observed): Flat Orientation: : Oriented to Self, Oriented to Place, Oriented to  Time, Oriented to Situation Alcohol / Substance Use: Not Applicable Psych Involvement: No (comment)  Admission diagnosis:  Hypokalemia [E87.6] Intractable pain [R52] Pathologic fracture [M84.40XA] Patient Active Problem List   Diagnosis Date Noted   Pathologic fracture 11/22/2023   Hypokalemia 11/22/2023   Closed fracture of shaft of right humerus 10/10/2023   Pressure injury of skin 10/10/2023   Cerebral intracranial abscess 10/07/2023   Palliative care encounter 10/04/2023   Pathological fracture of right humerus with delayed healing 10/04/2023   Lung cancer metastatic to brain (HCC) 10/04/2023   Vasogenic brain edema (HCC) 10/04/2023   Counseling and coordination of care 10/04/2023   Protein-calorie malnutrition, severe 10/04/2023   Metastasis to brain (HCC) 10/03/2023   Metastasis to bone (HCC) 10/03/2023   Cerebral edema (HCC) 10/01/2023   Primary adenocarcinoma of upper lobe of right lung (HCC) 09/22/2023   Pathological  fracture in neoplastic disease, right humerus, initial encounter for fracture 09/19/2023   Neoplasm causing mass effect and brain compression on adjacent structures (HCC) 09/18/2023   COPD (chronic obstructive pulmonary disease) (HCC)    Anxiety    Hyponatremia 04/16/2018   Depression 04/16/2018   Hypertensive urgency     HTN (hypertension) 01/13/2016   Right anterior shoulder pain 07/18/2014   Postconcussion syndrome 05/28/2014   PCP:  [name], NP Pharmacy:   [facility] [address] [place] [address] Phone: [phone] Fax: [phone]     Social Drivers of Health (SDOH) Social History: SDOH Screenings   Food Insecurity: No Food Insecurity (11/22/2023)  Housing: Low Risk  (11/22/2023)  Transportation Needs: No Transportation Needs (11/22/2023)  Utilities: At Risk (11/22/2023)  Depression (PHQ2-9): Low Risk  (11/07/2023)  Tobacco Use: Medium Risk (11/22/2023)   SDOH Interventions:     Readmission Risk Interventions    11/24/2023    4:23 PM 10/05/2023    9:51 AM 09/20/2023    2:40 PM  Readmission Risk Prevention Plan  Medication Screening   Complete  Transportation Screening Complete Complete Complete  HRI or Home Care Consult  Complete   Social Work Consult for Recovery Care Planning/Counseling  Complete   Palliative Care Screening  Complete   Medication Review Oceanographer) Complete Complete   PCP or Specialist appointment within 3-5 days of discharge Complete    HRI or Home Care Consult Complete    SW Recovery Care/Counseling Consult Complete    Palliative Care Screening Complete    Skilled Nursing Facility Not Applicable

## 2023-11-24 NOTE — Progress Notes (Signed)
  Progress Note   Date: 11/24/2023  Patient Name: Regina [name]        MRN#: [id number]  Magnesium was given for hypomagnesemia

## 2023-11-25 ENCOUNTER — Encounter: Payer: Self-pay | Admitting: Internal Medicine

## 2023-11-25 ENCOUNTER — Ambulatory Visit
Admit: 2023-11-25 | Discharge: 2023-11-25 | Disposition: A | Discharge status: Home / Self Care | Payer: MEDICAID | Attending: Radiation Oncology | Admitting: Radiation Oncology

## 2023-11-25 ENCOUNTER — Other Ambulatory Visit: Payer: Self-pay

## 2023-11-25 DIAGNOSIS — I10 Essential (primary) hypertension: Secondary | ICD-10-CM | POA: Diagnosis not present

## 2023-11-25 DIAGNOSIS — J42 Unspecified chronic bronchitis: Secondary | ICD-10-CM | POA: Diagnosis not present

## 2023-11-25 DIAGNOSIS — E876 Hypokalemia: Secondary | ICD-10-CM | POA: Diagnosis not present

## 2023-11-25 DIAGNOSIS — M84521A Pathological fracture in neoplastic disease, right humerus, initial encounter for fracture: Secondary | ICD-10-CM | POA: Diagnosis not present

## 2023-11-25 LAB — CBC WITH DIFFERENTIAL/PLATELET
Abs Immature Granulocytes: 0.06 10*3/uL (ref 0.00–0.07)
Basophils Absolute: 0.1 10*3/uL (ref 0.0–0.1)
Basophils Relative: 1 %
Eosinophils Absolute: 0.8 10*3/uL — ABNORMAL HIGH (ref 0.0–0.5)
Eosinophils Relative: 8 %
HCT: 37.7 % (ref 36.0–46.0)
Hemoglobin: 12.1 g/dL (ref 12.0–15.0)
Immature Granulocytes: 1 %
Lymphocytes Relative: 33 %
Lymphs Abs: 3.5 10*3/uL (ref 0.7–4.0)
MCH: 28.4 pg (ref 26.0–34.0)
MCHC: 32.1 g/dL (ref 30.0–36.0)
MCV: 88.5 fL (ref 80.0–100.0)
Monocytes Absolute: 0.9 10*3/uL (ref 0.1–1.0)
Monocytes Relative: 8 %
Neutro Abs: 5.5 10*3/uL (ref 1.7–7.7)
Neutrophils Relative %: 49 %
Platelets: 469 10*3/uL — ABNORMAL HIGH (ref 150–400)
RBC: 4.26 MIL/uL (ref 3.87–5.11)
RDW: 15.2 % (ref 11.5–15.5)
WBC: 10.9 10*3/uL — ABNORMAL HIGH (ref 4.0–10.5)
nRBC: 0 % (ref 0.0–0.2)

## 2023-11-25 LAB — COMPREHENSIVE METABOLIC PANEL
ALT: 12 U/L (ref 0–44)
AST: 20 U/L (ref 15–41)
Albumin: 3 g/dL — ABNORMAL LOW (ref 3.5–5.0)
Alkaline Phosphatase: 67 U/L (ref 38–126)
Anion gap: 9 (ref 5–15)
BUN: 6 mg/dL (ref 6–20)
CO2: 23 mmol/L (ref 22–32)
Calcium: 9.4 mg/dL (ref 8.9–10.3)
Chloride: 102 mmol/L (ref 98–111)
Creatinine, Ser: 0.56 mg/dL (ref 0.44–1.00)
GFR, Estimated: >60 mL/min (ref >60)
Glucose, Bld: 77 mg/dL (ref 70–99)
Potassium: 4.4 mmol/L (ref 3.5–5.1)
Sodium: 134 mmol/L — ABNORMAL LOW (ref 135–145)
Total Bilirubin: 0.9 mg/dL (ref 0.0–1.2)
Total Protein: 6.2 g/dL — ABNORMAL LOW (ref 6.5–8.1)

## 2023-11-25 LAB — RAD ONC ARIA SESSION SUMMARY
Course Elapsed Days: 1
Plan Fractions Treated to Date: 2
Plan Prescribed Dose Per Fraction: 6.5 Gy
Plan Total Fractions Prescribed: 2
Plan Total Prescribed Dose: 13 Gy
Reference Point Dosage Given to Date: 13 Gy
Reference Point Session Dosage Given: 6.5 Gy
Session Number: 2

## 2023-11-25 LAB — MAGNESIUM: Magnesium: 1.8 mg/dL (ref 1.7–2.4)

## 2023-11-25 LAB — PHOSPHORUS: Phosphorus: 2.7 mg/dL (ref 2.5–4.6)

## 2023-11-25 MED ORDER — OXYCODONE HCL 5 MG PO TABS
5.0000 mg | ORAL_TABLET | ORAL | 0 refills | Status: AC | PRN
  Filled 2023-11-25: qty 20, 4d supply, fill #0

## 2023-11-25 NOTE — Plan of Care (Signed)
  Problem: Activity: Goal: Risk for activity intolerance will decrease Outcome: Progressing   Problem: Pain Managment: Goal: General experience of comfort will improve and/or be controlled Outcome: Progressing   Problem: Safety: Goal: Ability to remain free from injury will improve Outcome: Progressing   Problem: Skin Integrity: Goal: Risk for impaired skin integrity will decrease Outcome: Progressing

## 2023-11-25 NOTE — Discharge Summary (Signed)
 Physician Discharge Summary   Patient: Regina [name] MRN: [id number] DOB: [birth year]  Admit date:     11/22/2023  Discharge date: 11/25/23  Discharge Physician: Debarah Crape   PCP: [name], NP   Recommendations at discharge:   Continue close outpatient follow-up with oncology, radiation oncology, and primary care.  Discharge Diagnoses: Principal Problem:   Pathologic fracture Active Problems:   HTN (hypertension)   COPD (chronic obstructive pulmonary disease) (HCC)   Lung cancer metastatic to brain (HCC)   Hypokalemia  Resolved Problems:   * No resolved hospital problems. *  Hospital Course: Regina [name] is 58 y.o. 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  She presents on this admission due to right arm pain and swelling.  She recently had sutures removed on 3/11 when she saw PA in the orthopedic office and complained of pain and swelling.  During that visit she was initiated on antibiotics and oxycodone.  Due to ongoing pain she presented in the ER on 3/17.  X-ray at that time revealed bony destruction around the hardware with metastatic disease.  EDP discussed directly with orthopedic surgeon [name], who suggested the distal screw is backing out due to progression of the cancer.  He requested transfer to [facility] for multi special management, which was ultimately approved however patient eloped from the ED prior to admission.  Patient reports her elopement was secondary to a family emergency and ultimately returned to the ED on 3/18 due to worsening pain.  Admitting physician again discussed with [employer] who now feels that the patient is no longer a candidate for aggressive interventions and recommends palliative care consult with radiation oncology.  The case was then discussed with Dr. Cherly Hensen of oncology who recommended palliative care  consult and rad/onc.  Patient was admitted for pain management. During this admission patient underwent radiation therapy x 2.  By evaluation on 3/21 she had good pain control with p.o. meds and was requesting for discharge home.  We had extensive discussions regarding her treatment plan outpatient.  We discussed the importance of close outpatient follow-up.  Patient was resistant to most discussions.  She reports she is not interested in chemotherapy at this time.  She also refused to consider hospice or palliative care.  Palliative care team was consulted and assisted with these conversations.  Patient did well with radiation.  She will need to continue to follow-up closely with oncology and radiation.  I stressed the importance of this to her.  She endorses understanding.   Intractable pain - Secondary to malignancy - Prescribe Oxy at discharge.  PDMP reviewed. - Palliative care team consulted to assist in symptom management   Pathologic fracture of right humerus - IM nail 2/3, was loss to follow-up.  Suture removal 3/11.  There was concern for superimposed infection at that time. - Doppler on arrival negative for DVT. - No leukocytosis or fever to suggest acute infection   Lung cancer with metastatic spread to brain and bones - Stage IV non-small cell lung cancer, adenocarcinoma.  Extensive bilateral hilar, mediastinal, supraclavicular lymphadenopathy and metastatic disease to bone and right arm as well as brain metastasis.  Initially diagnosed January 2025.  Was meant to initiate chemotherapy in March 2025 but has not followed up. - Oncology consulted, will plan for outpatient follow-up though at this time patient reports she is not ready for chemotherapy but  is also not interested in hospice.  I had extensive discussion with the patient regarding this. We discussed that chemotherapy is not her only option and she can consider hospice or palliative care if that is more in line with her goals.   Ultimately she did not want to have further conversations regarding this topic.  I reminded the patient that untreated cancer will continue to spread and she may not have the same options if she waits longer for treatment. She endorses understanding this. - Radiation treatment initiated - s/p two treatments. Tolerated well. - Appreciate palliative care team's ongoing discussions.   COPD, not currently in exacerbation - On room air, continue to monitor oxygen saturations - No wheeze - As needed nebs   Hypertension - Continue home meds   Hypokalemia Hyponatremia hypomagnesemia -Secondary to malignancy and poor p.o. intake.  Malnutrition. - Continue outpatient follow-up for lab monitoring  BMI 19      Consultants: Rad/Onc, Med/Onc, Orthopedics, Palliative  Disposition: Home Diet recommendation:  Discharge Diet Orders (From admission, onward)     Start     Ordered   11/25/23 0000  Diet general        11/25/23 1353           Regular diet DISCHARGE MEDICATION: Allergies as of 11/25/2023       Reactions   Pork Allergy Nausea And Vomiting   Bee Venom Hives        Medication List     STOP taking these medications    levETIRAcetam 500 MG tablet Commonly known as: Keppra   lidocaine-prilocaine cream Commonly known as: EMLA   nutrition supplement (JUVEN) Pack   ondansetron 8 MG tablet Commonly known as: ZOFRAN   prochlorperazine 10 MG tablet Commonly known as: COMPAZINE       TAKE these medications    acetaminophen 325 MG tablet Commonly known as: TYLENOL Take 2 tablets (650 mg total) by mouth every 6 (six) hours as needed for mild pain (pain score 1-3) (or Fever >/= 101).   amLODipine 5 MG tablet Commonly known as: NORVASC Take 1 tablet (5 mg total) by mouth daily.   oxyCODONE 5 MG immediate release tablet Commonly known as: Roxicodone Take 1 tablet (5 mg total) by mouth every 4 (four) hours as needed for up to 7 days for severe pain (pain score  7-10) or breakthrough pain.        Discharge Exam: Filed Weights   11/22/23 1306 11/22/23 2043  Weight: 55.8 kg 53.9 kg   General exam: Appears calm and comfortable, NAD  Respiratory system: No work of breathing, symmetric chest wall expansion Cardiovascular system: S1 & S2 heard, RRR.  Gastrointestinal system: Abdomen is nondistended, soft and nontender.  Neuro: Alert and oriented. No focal neurological deficits. Extremities: right Upper arm, swollen, diffusely tender to palpation. Skin: No rashes, lesions Psychiatry: Mood & affect appropriate for situation.   Condition at discharge: poor  The results of significant diagnostics from this hospitalization (including imaging, microbiology, ancillary and laboratory) are listed below for reference.   Imaging Studies: VAS [place] UPPER EXTREMITY VENOUS DUPLEX Result Date: 11/21/2023 UPPER VENOUS STUDY  Patient Name:  Regina CHARNLEY  Date of Exam:   11/21/2023 Medical Rec #: [id number]       Accession #:    [id number] Date of Birth: September 19, 1965       Patient Gender: F Patient Age:   39 years Exam Location:  [facility] Procedure:      VAS [place]  UPPER EXTREMITY VENOUS DUPLEX Referring Phys: Lynden Oxford --------------------------------------------------------------------------------  Indications: Pain, and Edema Risk Factors: Recent femur fracture with surgical repair (10/10/2023), recent suture removal (11/15/2023). Comparison Study: No previous exams Performing Technologist: Jody Hill RVT, RDMS  Examination Guidelines: A complete evaluation includes B-mode imaging, spectral Doppler, color Doppler, and power Doppler as needed of all accessible portions of each vessel. Bilateral testing is considered an integral part of a complete examination. Limited examinations for reoccurring indications may be performed as noted.  Right Findings: +----------+------------+---------+-----------+----------+--------------------+ RIGHT      CompressiblePhasicitySpontaneousProperties      Summary        +----------+------------+---------+-----------+----------+--------------------+ IJV           Full       No        Yes    pulsatile                      +----------+------------+---------+-----------+----------+--------------------+ Subclavian    Full       No        Yes    pulsatile                      +----------+------------+---------+-----------+----------+--------------------+ Axillary      Full       No        Yes    pulsatile                      +----------+------------+---------+-----------+----------+--------------------+ Brachial                 Yes       Yes                   patent by                                                              color/doppler     +----------+------------+---------+-----------+----------+--------------------+ Radial        Full                                                       +----------+------------+---------+-----------+----------+--------------------+ Ulnar                                                  Not visualized    +----------+------------+---------+-----------+----------+--------------------+ Cephalic      Full                                                       +----------+------------+---------+-----------+----------+--------------------+ Basilic                                                Not visualized    +----------+------------+---------+-----------+----------+--------------------+  Unable to visualized basilic or ulnar veins due to patient inability to move arm and edema. Limited visualization of cephalic and brachial due to immobility and edema, areas visualized appear patent.  Left Findings: +----+------------+---------+-----------+----------+-----------------------+ LEFTCompressiblePhasicitySpontaneousProperties        Summary          +----+------------+---------+-----------+----------+-----------------------+ IJV                Yes       Yes              patent by color/doppler +----+------------+---------+-----------+----------+-----------------------+  Summary:  Right: No evidence of deep vein thrombosis in the upper extremity in areas visualized. No evidence of superficial vein thrombosis in the upper extremity in areas visualized. However, unable to visualize the basilic and ulnar veins.  Left: No evidence of thrombosis in the subclavian.  *See table(s) above for measurements and observations.  Diagnosing physician: Gerarda Fraction Electronically signed by Gerarda Fraction on 11/21/2023 at 5:31:48 PM.    Final    DG Shoulder Right Result Date: 11/21/2023 CLINICAL DATA:  Right arm pain and swelling. EXAM: RIGHT HUMERUS - 2+ VIEW; RIGHT SHOULDER - 2+ VIEW COMPARISON:  None Available. FINDINGS: No acute fracture or dislocation. Right humerus ORIF noted fixated with 2 threaded screw proximally and 1 threaded screw distally. The hardware is intact. No periprosthetic fracture or lucency. However, there is permeative pattern in the humerus in the middle third shaft and there are large areas of bone destruction surrounding the hardware. Glenohumeral and acromioclavicular joints are normal in alignment and exhibit minimal degenerative changes. No soft tissue swelling. No radiopaque foreign bodies. IMPRESSION: *No acute fracture or dislocation. *Permeative pattern in the humerus in the middle third shaft and large areas of bone destruction surrounding the hardware. Findings are compatible with metastatic disease. Electronically Signed   By: Jules Schick M.D.   On: 11/21/2023 15:51   DG Humerus Right Result Date: 11/21/2023 CLINICAL DATA:  Right arm pain and swelling. EXAM: RIGHT HUMERUS - 2+ VIEW; RIGHT SHOULDER - 2+ VIEW COMPARISON:  None Available. FINDINGS: No acute fracture or dislocation. Right humerus ORIF noted fixated with 2 threaded  screw proximally and 1 threaded screw distally. The hardware is intact. No periprosthetic fracture or lucency. However, there is permeative pattern in the humerus in the middle third shaft and there are large areas of bone destruction surrounding the hardware. Glenohumeral and acromioclavicular joints are normal in alignment and exhibit minimal degenerative changes. No soft tissue swelling. No radiopaque foreign bodies. IMPRESSION: *No acute fracture or dislocation. *Permeative pattern in the humerus in the middle third shaft and large areas of bone destruction surrounding the hardware. Findings are compatible with metastatic disease. Electronically Signed   By: Jules Schick M.D.   On: 11/21/2023 15:51   NM PET Image Initial (PI) Skull Base To Thigh (F-18 FDG) Result Date: 10/28/2023 CLINICAL DATA:  Initial treatment strategy for stage IV non-small cell lung cancer with pathologic right humerus fracture post fixation and with brain metastasis. EXAM: NUCLEAR MEDICINE PET SKULL BASE TO THIGH TECHNIQUE: 6.6 mCi F-18 FDG was injected intravenously. Full-ring PET imaging was performed from the skull base to thigh after the radiotracer. CT data was obtained and used for attenuation correction and anatomic localization. Fasting blood glucose: 94 mg/dl COMPARISON:  21/30/8657 CT chest, abdomen and pelvis. FINDINGS: Mediastinal blood pool activity: SUV max 2.0 Liver activity: SUV max NA NECK: No hypermetabolic lymph nodes in the neck. Incidental CT findings: None. CHEST: Intensely hypermetabolic lobulated solid 2.6  x 2.0 cm peripheral apical right upper lobe pulmonary nodule with max SUV 13.6 (series 7/image 14). No additional hypermetabolic pulmonary findings. Low level hypermetabolism associated with faintly calcified paratracheal, AP window, subcarinal and bilateral hilar lymph nodes. Representative faintly calcified 1.4 cm right paratracheal node with max SUV 3.6 (series 4/image 56). Right hilar adenopathy with max  SUV 3.1 and left hilar adenopathy with max SUV 2.8. Incidental CT findings: Trace layering right pleural effusion. Moderate paraseptal and centrilobular emphysema with mild diffuse bronchial wall thickening. Patchy reticulation and apparent honeycombing at the lung bases. Atherosclerotic nonaneurysmal thoracic aorta. ABDOMEN/PELVIS: No abnormal hypermetabolic activity within the liver, pancreas, adrenal glands, or spleen. No hypermetabolic lymph nodes in the abdomen or pelvis. Incidental CT findings: Atherosclerotic nonaneurysmal abdominal aorta. Calcified top-normal size uterus with presumed small degenerative fibroids. SKELETON: Hypermetabolic medial lower left sacral lesion with max SUV 12.9, CT occult. Large hypermetabolic expansile lytic destructive right mid humerus shaft 7 cm lesion with max SUV 18.0 status post transfixation of pathologic right mid humeral shaft fracture with intramedullary rod with proximal and distal interlocking screws. Incidental CT findings: None. IMPRESSION: 1. Intensely hypermetabolic lobulated solid 2.6 cm peripheral apical right upper lobe pulmonary nodule, compatible with primary bronchogenic malignancy. 2. Hypermetabolic large expansile lytic destructive right mid humerus shaft 7 cm bone metastasis status post transfixation of pathologic right mid humerus shaft fracture. 3. Hypermetabolic medial lower left sacral bone metastasis, CT occult. 4. Low level hypermetabolism associated with faintly calcified mediastinal and bilateral hilar lymph nodes, favoring sequela of prior granulomatous disease, likely sarcoidosis. 5. Trace layering right pleural effusion. 6. Aortic Atherosclerosis (ICD10-I70.0) and Emphysema (ICD10-J43.9). Electronically Signed   By: Delbert Phenix M.D.   On: 10/28/2023 11:24    Microbiology: Results for orders placed or performed during the hospital encounter of 11/21/23  Blood culture (routine x 2)     Status: None (Preliminary result)   Collection Time:  11/21/23  2:35 PM   Specimen: BLOOD  Result Value Ref Range Status   Specimen Description   Final    BLOOD LEFT ANTECUBITAL Performed at [facility], [address] [place], [place] [address]    Special Requests   Final    BOTTLES DRAWN AEROBIC AND ANAEROBIC Blood Culture results may not be optimal due to an inadequate volume of blood received in culture bottles Performed at [facility], [address], [place], [place] [address]    Culture   Final    NO GROWTH 4 DAYS Performed at [facility], [address], [place], [place] [address]    Report Status PENDING  Incomplete    Labs: CBC: Recent Labs  Lab 11/21/23 1435 11/22/23 1635 11/23/23 0635 11/24/23 0610 11/25/23 0549  WBC 9.4 10.4 8.9 10.0 10.9*  NEUTROABS 4.5 4.2  --  4.7 5.5  HGB 13.3 11.8* 11.2* 12.1 12.1  HCT 40.0 35.3* 35.1* 36.4 37.7  MCV 87.3 86.1 88.6 87.3 88.5  PLT 487* 492* 454* 480* 469*   Basic Metabolic Panel: Recent Labs  Lab 11/21/23 1435 11/22/23 1635 11/22/23 2058 11/23/23 0635 11/24/23 0610 11/25/23 0549  NA 137 131*  --  136 133* 134*  K 2.8* 2.7*  --  3.3* 2.7* 4.4  CL 98 93*  --  101 97* 102  CO2 26 26  --  25 24 23   GLUCOSE 92 103*  --  95 79 77  BUN <5* 7  --  5* <5* 6  CREATININE 0.64 0.66  --  0.55 0.58 0.56  CALCIUM 10.7* 9.9  --  9.4 9.4 9.4  MG  --  1.7  --  1.7 1.5* 1.8  PHOS  --   --  3.4 3.0 3.1 2.7   Liver Function Tests: Recent Labs  Lab 11/21/23 1435 11/23/23 0635 11/24/23 0610 11/25/23 0549  AST 39 32 25 20  ALT 20 17 14 12   ALKPHOS 95 76 75 67  BILITOT 0.4 0.6 0.7 0.9  PROT 8.0 6.5 6.4* 6.2*  ALBUMIN 3.7 3.1* 3.0* 3.0*   CBG: No results for input(s): "GLUCAP" in the last 168 hours.  Discharge time spent: 32 minutes.  Signed: Debarah Crape, DO [facility] 11/25/2023

## 2023-11-25 NOTE — Hospital Course (Signed)
 Regina [name] is 58 y.o. 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  She presents on this admission due to right arm pain and swelling.  She recently had sutures removed on 3/11 when she saw PA in the orthopedic office and complained of pain and swelling.  During that visit she was initiated on antibiotics and oxycodone.  Due to ongoing pain she presented in the ER on 3/17.  X-ray at that time revealed bony destruction around the hardware with metastatic disease.  EDP discussed directly with orthopedic surgeon [name], who suggested the distal screw is backing out due to progression of the cancer.  He requested transfer to [facility] for multi special management, which was ultimately approved however patient eloped from the ED prior to admission.  Patient reports her elopement was secondary to a family emergency and ultimately returned to the ED on 3/18 due to worsening pain.  Admitting physician again discussed with [facility] who now feels that the patient is no longer a candidate for aggressive interventions and recommends palliative care consult with radiation oncology.  The case was then discussed with Dr. Cherly Hensen of oncology who recommended palliative care consult and rad/onc.  Patient was admitted for pain management. During this admission patient underwent radiation therapy x 2.  By evaluation on 3/21 she had good pain control with p.o. meds and was requesting for discharge home.  We had extensive discussions regarding her treatment plan outpatient.  We discussed the importance of close outpatient follow-up.  Patient was resistant to most discussions.  She reports she is not interested in chemotherapy at this time.  She also refused to consider hospice or palliative care.  Palliative care team was consulted and assisted with these conversations.  Patient did well with radiation.  She  will need to continue to follow-up closely with oncology and radiation.  I stressed the importance of this to her.  She endorses understanding.   Intractable pain - Secondary to malignancy - Prescribe Oxy at discharge.  PDMP reviewed. - Palliative care team consulted to assist in symptom management   Pathologic fracture of right humerus - IM nail 2/3, was loss to follow-up.  Suture removal 3/11.  There was concern for superimposed infection at that time. - Doppler on arrival negative for DVT. - No leukocytosis or fever to suggest acute infection   Lung cancer with metastatic spread to brain and bones - Stage IV non-small cell lung cancer, adenocarcinoma.  Extensive bilateral hilar, mediastinal, supraclavicular lymphadenopathy and metastatic disease to bone and right arm as well as brain metastasis.  Initially diagnosed January 2025.  Was meant to initiate chemotherapy in March 2025 but has not followed up. - Oncology consulted, will plan for outpatient follow-up though at this time patient reports she is not ready for chemotherapy but is also not interested in hospice.  I had extensive discussion with the patient regarding this. We discussed that chemotherapy is not her only option and she can consider hospice or palliative care if that is more in line with her goals.  Ultimately she did not want to have further conversations regarding this topic.  I reminded the patient that untreated cancer will continue to spread and she may not have the same options if she waits longer for treatment. She endorses understanding this. - Radiation treatment initiated - s/p two treatments. Tolerated well. - Appreciate palliative care team's ongoing discussions.  COPD, not currently in exacerbation - On room air, continue to monitor oxygen saturations - No wheeze - As needed nebs   Hypertension - Continue home meds   Hypokalemia Hyponatremia hypomagnesemia -Secondary to malignancy and poor p.o. intake.   Malnutrition. - Continue outpatient follow-up for lab monitoring

## 2023-11-25 NOTE — Evaluation (Signed)
 Occupational Therapy Evaluation Patient Details Name: Regina [name] MRN: [id number] DOB: 04/22/1966 Today's Date: 11/25/2023   History of Present Illness   Regina [name] is a 58 y.o. female presents with R arm pain 11/22/23. 3/17 xray showing bony destruction around hardware metastatic disease. Of note, admitted to [facility] on 10/01/2023 due to difficulty speaking; had XRT for brain tumor, then transferred to [facility] on 10/06/23 and underwent frontal craniotomy on 10/07/23 due to brain mass/abcess.  Pt was hospitalized 1/11-1/15/2025 secondary to metastatic lung ca to brain and R UE weakness with pathologic humerus fx.  PMH: HTN, COPD, lung cancer with metastasis to brain s/p surgery and pathologic fracture of the shoulder s/p IM nailing R humerus on 10/10/2023     Clinical Impressions Patient evaluated by Occupational Therapy with no further acute OT needs identified. All education has been completed and the patient has no further questions.  See below for any follow-up Occupational Therapy or equipment needs. OT is signing off.  Will defer to outpatient shoulder rehab. thank you for this referral.      If plan is discharge home, recommend the following:   Assistance with cooking/housework;A little help with bathing/dressing/bathroom     Functional Status Assessment   Patient has had a recent decline in their functional status and demonstrates the ability to make significant improvements in function in a reasonable and predictable amount of time.     Equipment Recommendations   None recommended by OT     Recommendations for Other Services         Precautions/Restrictions   Precautions Precaution/Restrictions Comments: pt has R sling on for comfort. Using ring for waist strap incorrectly and shown thumb loop instead. Pt reported, "Oh, that's what that's for!" Required Braces or Orthoses: Sling Restrictions Weight Bearing Restrictions Per Provider Order: No Other Position/Activity  Restrictions: per 2/6 ortho note from previous admission "She may use her right arm back as tolerated for wall climbs and active and active assisted range of motion as tolerated.  She may perform any ADLs.  She may begin strengthening as she can tolerate"     Mobility Bed Mobility Overal bed mobility: Independent                  Transfers Overall transfer level: Independent                        Balance Overall balance assessment: No apparent balance deficits (not formally assessed)                                         ADL either performed or assessed with clinical judgement   ADL Overall ADL's : At baseline                                       General ADL Comments: Patient educated per surgeons notes, that patient is allowed to use her right arm with ADLs as tolerated.  Patient encouraged to move right arm and pain-free areas when feeding self or grooming.  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     Vision   Vision Assessment?:  No apparent visual deficits     Perception         Praxis         Pertinent Vitals/Pain Pain Assessment Pain Assessment: 0-10 Pain Score: 5  Pain Location: RT shoulder Pain Descriptors / Indicators: Grimacing, Guarding, Discomfort Pain Intervention(s): Limited activity within patient's tolerance, Monitored during session, Repositioned, Relaxation (Pt declined ice or heat pack)     Extremity/Trunk Assessment Upper Extremity Assessment Upper Extremity Assessment: Right hand dominant;RUE deficits/detail RUE Deficits / Details: Hand/wrist/foream ROM intact. Increased pain with sup/pronation. Cues to slow down as pt moves thorugh HEP very quickly.  Noted shortened bicep perhaps from extended time in sling with  about 30 degrees lacking of elbow extension.  Patient is able to perform dangle and shown this method for underarm hygiene as well as using for pendulums.  Pain is preventing patient from tolerating any kind of "wall crawl" despite use of compensatory strategies.  Patient reminded that shoulder rehab does take time and patient can progress her shoulder range of motion while in outpatient.  Patient did confirm that she will have follow-up outpatient appointments. RUE Sensation: WNL RUE Coordination: WNL   Lower Extremity Assessment Lower Extremity Assessment: Overall WFL for tasks assessed   Cervical / Trunk Assessment Cervical / Trunk Assessment: Normal   Communication Communication Communication: No apparent difficulties   Cognition Arousal: Alert Behavior During Therapy: WFL for tasks assessed/performed               OT - Cognition Comments: Seems to struggle to recall HEP from previously, and needs occassional cues for gentle rehab to RT shoulder rather than taking LT hand to "crank" on RT UE.                         Cueing  General Comments          Exercises Other Exercises Other Exercises: Patient completed hand wrist forearm and elbow AROM exercises with OT demonstrating along with her.  Patient able to demonstrate dangle but did not begin Codman's due to patient with increased pain.   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, reports providing mostly supv and no physical assistance; pt reports her brother is with her aunt today while she is in hospital      Prior  Functioning/Environment Prior Level of Function : Independent/Modified Independent;Working/employed;Driving             Mobility Comments: no AD ADLs Comments: works for [employer], 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    OT Problem List: Decreased range of motion;Decreased strength;Pain;Impaired UE functional use   OT Treatment/Interventions:        OT Goals(Current goals can be found in the care plan section)   Acute Rehab OT Goals Patient Stated Goal: "I'm going home today!" OT Goal Formulation: All assessment and education complete, DC therapy   OT Frequency:       Co-evaluation              AM-PAC OT "6 Clicks" Daily Activity     Outcome Measure Help from another  person eating meals?: None Help from another person taking care of personal grooming?: None Help from another person toileting, which includes using toliet, bedpan, or urinal?: None Help from another person bathing (including washing, rinsing, drying)?: None Help from another person to put on and taking off regular upper body clothing?: None Help from another person to put on and taking off regular lower body clothing?: A Little 6 Click Score: 23   End of Session Equipment Utilized During Treatment:  (sling-patient able to don, doff, and adjust independently)  Activity Tolerance: Patient tolerated treatment well Patient left: Other (comment) (Sitting edge of bed)  OT Visit Diagnosis: Muscle weakness (generalized) (M62.81);Pain Pain - Right/Left: Right Pain - part of body: Shoulder;Arm                Time: 4098-1191 OT Time Calculation (min): 17 min Charges:  OT General Charges $OT Visit: 1 Visit OT Evaluation $OT Eval Low Complexity: 1 Low  Maggie Dworkin, OT Acute Rehab Services Office: [phone] 11/25/2023   Theodoro Clock 11/25/2023, 11:27 AM

## 2023-11-25 NOTE — Progress Notes (Signed)
 Occupational Therapy Cancellation Note   11/24/23 1500  OT Visit Information  Last OT Received On 11/24/23  Reason Eval/Treat Not Completed Other (comment) (Pt had a very emotional day and nsg requesting to hold today.)   Luisa Dago, OT/L   Acute OT Clinical Specialist Acute Rehabilitation Services Pager [phone] Office [phone]

## 2023-11-25 NOTE — Plan of Care (Signed)

## 2023-11-26 LAB — CULTURE, BLOOD (ROUTINE X 2): Culture: NO GROWTH

## 2023-11-28 ENCOUNTER — Telehealth: Payer: Self-pay

## 2023-11-28 NOTE — Transitions of Care (Post Inpatient/ED Visit) (Signed)
   11/28/2023  Name: Regina [name] MRN: [id number] DOB: 09-28-1965  Today's TOC FU Call Status: Unsuccessful Call (1st Attempt) Date: 11/28/23  Attempted to reach the patient regarding the most recent Inpatient/ED visit.  Follow Up Plan: Additional outreach attempts will be made to reach the patient to complete the Transitions of Care (Post Inpatient/ED visit) call.   Signature  Robyne Peers, RN

## 2023-11-28 NOTE — Transitions of Care (Post Inpatient/ED Visit) (Signed)
   11/28/2023  Name: Regina [name] MRN: [id number] DOB: 1966-08-04  Today's TOC FU Call Status: Today's TOC FU Call Status:: Unsuccessful Call (1st Attempt) Unsuccessful Call (1st Attempt) Date: 11/28/23  Attempted to reach the patient regarding the most recent Inpatient/ED visit.  Follow Up Plan: Additional outreach attempts will be made to reach the patient to complete the Transitions of Care (Post Inpatient/ED visit) call.   Kenzlee Fishburn A. Mliss Fritz RN, BA, [facility], CRRN [facility] RN Care Manager, Transition of Care [phone]

## 2023-11-28 NOTE — Radiation Completion Notes (Signed)
 Patient Name: Regina [name], Regina [name] MRN: [id number] Date of Birth: 1965/10/14 Referring Physician: Gwinda Passe, M.D. Date of Service: 2023-11-28 Radiation Oncologist: Lonie Peak, M.D. [facility]                             RADIATION ONCOLOGY END OF TREATMENT NOTE     Diagnosis: C79.51 Secondary malignant neoplasm of bone Staging on 2023-09-22: Primary adenocarcinoma of upper lobe of right lung (HCC) T=cT1c, N=cN3, M=cM1c2 Intent: Palliative     ==========DELIVERED PLANS==========  First Treatment Date: 2023-11-24 Last Treatment Date: 2023-11-25   Plan Name: Ext_R_Humerus Site: Humerus, Right Technique: Isodose Plan Mode: Photon Dose Per Fraction: 6.5 Gy Prescribed Dose (Delivered / Prescribed): 13 Gy / 13 Gy Prescribed Fxs (Delivered / Prescribed): 2 / 2     ==========ON TREATMENT VISIT DATES========== 2023-11-25     ==========UPCOMING VISITS==========       ==========APPENDIX - ON TREATMENT VISIT NOTES==========   See weekly On Treatment Notes in Epic for details in the Media tab (listed as Progress notes on the On Treatment Visit Dates listed above).

## 2023-11-29 ENCOUNTER — Telehealth: Payer: Self-pay

## 2023-11-29 NOTE — Progress Notes (Deleted)
 [facility] OFFICE PROGRESS NOTE  Regina Sessions, NP [address] [place] [address]  DIAGNOSIS:  Stage IV (T1c, N3, M1 C) non-small cell lung cancer, adenocarcinoma presented with right upper lobe lung mass in addition to extensive bilateral hilar, mediastinal as well as right supraclavicular lymphadenopathy and metastatic disease to the bone in the right arm as well as brain metastasis diagnosed in January 2025.    Molecular Studies: [name] actionable mutations   TMB: 14 mut/mb   PDL1" 90%  PRIOR THERAPY: 1) Craniotomy and SRS under the care of [name] and [name] on 10/06/23 and 10/07/23 2) Right humeral nailing by [name] due to pathologic fracture on 10/10/23 3) Palliative radiation to the right humerus on 11/30/23  CURRENT THERAPY: Pembrolizumab 200 Mg IV every 3 weeks. First dose on ***  INTERVAL HISTORY: Regina [name] 58 y.o. female returns to the clinic today for follow-up visit.  The patient was last seen in the clinic by [name] on 11/02/2023.  The patient has stage IV lung cancer. The patient underwent craniotomy followed by SRS to the brain metastasis in addition to right humeral nailing for pathologic fracture and February 2025.  She has been very difficult to get to the clinic.  She was supposed to start her systemic immunotherapy earlier this month but has missed several appointments despite being established with social work and being enrolled in the transportation program.  She was supposed to start treatment on 11/09/2023.  She had a follow-up appointment with orthopedic surgery in 11/15/2023. he does continue to complain of pain particularly at night and is not getting relief with over-the-counter meds. She has recently begun with occupational therapy however does still remain in the sling.  He was given antibiotics for possible infection.  She presented to the emergency room on 11/22/2023 for arm pain.  Is having worsening swelling over  the right arm with severe pain.  X-ray of the arm showed bony destruction around the hardware with metastatic disease.  Discussed her case with physician and orthopedics at [facility] who now feels that the patient is not a candidate for aggressive interventions and recommends palliative care and consult with radiation oncology during his admission he underwent lesion.  The last day radiation is scheduled for 12/02/23  During her hospitalization the patient states she is not interested in chemotherapy but she also refused to consider hospice and palliative care. Of note, the patient's treatment plan is immunotherapy not chemotherapy. The patient also missed her numeral appointments for patient education to review her treatment in more detail with our educators.   Of note, she previously talked to the navigator that she feels so good and does not want to "feel bad".   Today, the patient is feeling ***. She denies fevers, chills, night sweats, or weight loss. Her breathing is ***. Cough***. She denies chest pain or hemoptysis. She denies nausea, vomiting, diarrhea, or constipation. She denies headaches or vision changes. She denies rashes or skin changes. She is scheduled to see palliative care on 12/08/23.    MEDICAL HISTORY: Past Medical History:  Diagnosis Date   Allergy    Anxiety    Brain mass    COPD (chronic obstructive pulmonary disease) (HCC)    Emphysema of lung (HCC)    Hypertension     ALLERGIES:  is allergic to pork allergy and bee venom.  MEDICATIONS:  Current Outpatient Medications  Medication Sig Dispense Refill   acetaminophen (TYLENOL) 325 MG tablet Take  2 tablets (650 mg total) by mouth every 6 (six) hours as needed for mild pain (pain score 1-3) (or Fever >/= 101). 30 tablet 0   amLODipine (NORVASC) 5 MG tablet Take 1 tablet (5 mg total) by mouth daily. 90 tablet 1   oxyCODONE (ROXICODONE) 5 MG immediate release tablet Take 1 tablet (5 mg total) by mouth every 4 (four)  hours as needed for up to 7 days for severe pain (pain score 7-10) or breakthrough pain. 20 tablet 0   [name] current facility-administered medications for this visit.    SURGICAL HISTORY:  Past Surgical History:  Procedure Laterality Date   APPLICATION OF CRANIAL NAVIGATION Left 10/07/2023   Procedure: APPLICATION OF CRANIAL NAVIGATION;  Surgeon: Coletta Memos, MD;  Location: [facility] OR;  Service: Neurosurgery;  Laterality: Left;   CRANIOTOMY Left 10/07/2023   Procedure: FRONTAL CRANIOTOMY FOR ABSCESS;  Surgeon: Coletta Memos, MD;  Location: [facility] OR;  Service: Neurosurgery;  Laterality: Left;   dental procedure     HUMERUS IM NAIL Right 10/10/2023   Procedure: INTRAMEDULLARY (IM) NAIL HUMERAL RIGHT;  Surgeon: Huel Cote, MD;  Location: [facility] OR;  Service: Orthopedics;  Laterality: Right;    REVIEW OF SYSTEMS:   Review of Systems  Constitutional: Negative for appetite change, chills, fatigue, fever and unexpected weight change.  HENT:   Negative for mouth sores, nosebleeds, sore throat and trouble swallowing.   Eyes: Negative for eye problems and icterus.  Respiratory: Negative for cough, hemoptysis, shortness of breath and wheezing.   Cardiovascular: Negative for chest pain and leg swelling.  Gastrointestinal: Negative for abdominal pain, constipation, diarrhea, nausea and vomiting.  Genitourinary: Negative for bladder incontinence, difficulty urinating, dysuria, frequency and hematuria.   Musculoskeletal: Negative for back pain, gait problem, neck pain and neck stiffness.  Skin: Negative for itching and rash.  Neurological: Negative for dizziness, extremity weakness, gait problem, headaches, light-headedness and seizures.  Hematological: Negative for adenopathy. Does not bruise/bleed easily.  Psychiatric/Behavioral: Negative for confusion, depression and sleep disturbance. The patient is not nervous/anxious.     PHYSICAL EXAMINATION:  Last menstrual period 06/06/2010.  ECOG PERFORMANCE  STATUS: {CHL ONC ECOG [id number]  Physical Exam  Constitutional: Oriented to person, place, and time and well-developed, well-nourished, and in [name] distress. [name] distress.  HENT:  Head: Normocephalic and atraumatic.  Mouth/Throat: Oropharynx is clear and moist. [name] oropharyngeal exudate.  Eyes: Conjunctivae are normal. Right eye exhibits [name] discharge. Left eye exhibits [name] discharge. [name] scleral icterus.  Neck: Normal range of motion. Neck supple.  Cardiovascular: Normal rate, regular rhythm, normal heart sounds and intact distal pulses.   Pulmonary/Chest: Effort normal and breath sounds normal. [name] respiratory distress. [name] wheezes. [name] rales.  Abdominal: Soft. Bowel sounds are normal. Exhibits [name] distension and [name] mass. There is [name] tenderness.  Musculoskeletal: Normal range of motion. Exhibits [name] edema.  Lymphadenopathy:    [name] cervical adenopathy.  Neurological: Alert and oriented to person, place, and time. Exhibits normal muscle tone. Gait normal. Coordination normal.  Skin: Skin is warm and dry. [name] rash noted. Not diaphoretic. [name] erythema. [name] pallor.  Psychiatric: Mood, memory and judgment normal.  Vitals reviewed.  LABORATORY DATA: Lab Results  Component Value Date   WBC 10.9 (H) 11/25/2023   HGB 12.1 11/25/2023   HCT 37.7 11/25/2023   MCV 88.5 11/25/2023   PLT 469 (H) 11/25/2023      Chemistry      Component Value Date/Time   NA 134 (L) 11/25/2023 0549   NA 130 (  L) 10/19/2017 0942   K 4.4 11/25/2023 0549   CL 102 11/25/2023 0549   CO2 23 11/25/2023 0549   BUN 6 11/25/2023 0549   BUN 4 (L) 10/19/2017 0942   CREATININE 0.56 11/25/2023 0549   CREATININE 0.51 11/02/2023 0845      Component Value Date/Time   CALCIUM 9.4 11/25/2023 0549   ALKPHOS 67 11/25/2023 0549   AST 20 11/25/2023 0549   AST 16 11/02/2023 0845   ALT 12 11/25/2023 0549   ALT 13 11/02/2023 0845   BILITOT 0.9 11/25/2023 0549   BILITOT 0.3 11/02/2023 0845       RADIOGRAPHIC STUDIES:  DG Humerus  Right Result Date: 11/27/2023 CLINICAL DATA:  Limited range of motion. Patient reports injury in December. EXAM: RIGHT HUMERUS - 2+ VIEW COMPARISON:  Radiograph 10/01/2023 FINDINGS: Technically limited due to overlapping soft tissues. Humeral intramedullary nail with proximal distal locking screws. Progressive bone destruction at the site of metastatic lesion in the mid humeral shaft. Lytic lesion currently spans 8.9 cm, previously 6.6 cm. [name] new fracture. Shoulder and elbow alignment are maintained. IMPRESSION: 1. Progressive bone destruction at the site of metastatic lesion in the mid humeral shaft. 2. Humeral intramedullary nail without hardware complication. Electronically Signed   By: Narda Rutherford M.D.   On: 11/27/2023 17:43   VAS [place] UPPER EXTREMITY VENOUS DUPLEX Result Date: 11/21/2023 UPPER VENOUS STUDY  Patient Name:  Regina [name]  Date of Exam:   11/21/2023 Medical Rec #: [id number]       Accession #:    [id number] Date of Birth: 11/04/1965       Patient Gender: F Patient Age:   5 years Exam Location:  [facility] Procedure:      VAS [place] UPPER EXTREMITY VENOUS DUPLEX Referring Phys: Lynden Oxford --------------------------------------------------------------------------------  Indications: Pain, and Edema Risk Factors: Recent femur fracture with surgical repair (10/10/2023), recent suture removal (11/15/2023). Comparison Study: [name] previous exams Performing Technologist: Jody Hill RVT, RDMS  Examination Guidelines: A complete evaluation includes B-mode imaging, spectral Doppler, color Doppler, and power Doppler as needed of all accessible portions of each vessel. Bilateral testing is considered an integral part of a complete examination. Limited examinations for reoccurring indications may be performed as noted.  Right Findings: +----------+------------+---------+-----------+----------+--------------------+ RIGHT     CompressiblePhasicitySpontaneousProperties      Summary         +----------+------------+---------+-----------+----------+--------------------+ IJV           Full       [name]        Yes    pulsatile                      +----------+------------+---------+-----------+----------+--------------------+ Subclavian    Full       [name]        Yes    pulsatile                      +----------+------------+---------+-----------+----------+--------------------+ Axillary      Full       [name]        Yes    pulsatile                      +----------+------------+---------+-----------+----------+--------------------+ Brachial                 Yes       Yes  patent by                                                              color/doppler     +----------+------------+---------+-----------+----------+--------------------+ Radial        Full                                                       +----------+------------+---------+-----------+----------+--------------------+ Ulnar                                                  Not visualized    +----------+------------+---------+-----------+----------+--------------------+ Cephalic      Full                                                       +----------+------------+---------+-----------+----------+--------------------+ Basilic                                                Not visualized    +----------+------------+---------+-----------+----------+--------------------+ Unable to visualized basilic or ulnar veins due to patient inability to move arm and edema. Limited visualization of cephalic and brachial due to immobility and edema, areas visualized appear patent.  Left Findings: +----+------------+---------+-----------+----------+-----------------------+ LEFTCompressiblePhasicitySpontaneousProperties        Summary         +----+------------+---------+-----------+----------+-----------------------+ IJV                Yes       Yes              patent by  color/doppler +----+------------+---------+-----------+----------+-----------------------+  Summary:  Right: [name] evidence of deep vein thrombosis in the upper extremity in areas visualized. [name] evidence of superficial vein thrombosis in the upper extremity in areas visualized. However, unable to visualize the basilic and ulnar veins.  Left: [name] evidence of thrombosis in the subclavian.  *See table(s) above for measurements and observations.  Diagnosing physician: Gerarda Fraction Electronically signed by Gerarda Fraction on 11/21/2023 at 5:31:48 PM.    Final    DG Shoulder Right Result Date: 11/21/2023 CLINICAL DATA:  Right arm pain and swelling. EXAM: RIGHT HUMERUS - 2+ VIEW; RIGHT SHOULDER - 2+ VIEW COMPARISON:  None Available. FINDINGS: [name] acute fracture or dislocation. Right humerus ORIF noted fixated with 2 threaded screw proximally and 1 threaded screw distally. The hardware is intact. [name] periprosthetic fracture or lucency. However, there is permeative pattern in the humerus in the middle third shaft and there are large areas of bone destruction surrounding the hardware. Glenohumeral and acromioclavicular joints are normal in alignment and exhibit minimal degenerative changes. [name] soft tissue swelling. [name] radiopaque foreign bodies. IMPRESSION: *[name] acute fracture or dislocation. *Permeative pattern in the humerus in the middle third shaft and large  areas of bone destruction surrounding the hardware. Findings are compatible with metastatic disease. Electronically Signed   By: Jules Schick M.D.   On: 11/21/2023 15:51   DG Humerus Right Result Date: 11/21/2023 CLINICAL DATA:  Right arm pain and swelling. EXAM: RIGHT HUMERUS - 2+ VIEW; RIGHT SHOULDER - 2+ VIEW COMPARISON:  None Available. FINDINGS: [name] acute fracture or dislocation. Right humerus ORIF noted fixated with 2 threaded screw proximally and 1 threaded screw distally. The hardware is intact. [name] periprosthetic fracture or lucency. However, there is permeative  pattern in the humerus in the middle third shaft and there are large areas of bone destruction surrounding the hardware. Glenohumeral and acromioclavicular joints are normal in alignment and exhibit minimal degenerative changes. [name] soft tissue swelling. [name] radiopaque foreign bodies. IMPRESSION: *[name] acute fracture or dislocation. *Permeative pattern in the humerus in the middle third shaft and large areas of bone destruction surrounding the hardware. Findings are compatible with metastatic disease. Electronically Signed   By: Jules Schick M.D.   On: 11/21/2023 15:51     ASSESSMENT/PLAN:  This is a very pleasant 58 years old African-American female with Stage IV (T1c, N3, M1 C) non-small cell lung cancer, adenocarcinoma presented with right upper lobe lung mass in addition to extensive bilateral hilar, mediastinal as well as right supraclavicular lymphadenopathy and metastatic disease to the bone in the right arm as well as brain metastasis diagnosed in January 2025.  Molecular studies showed [name] actionable mutation and she had PD-L1 expression of 90%.  She underwent:  --Craniotomy and SRS under the care of [name] and [name] on 10/06/23 and 10/07/23 -- Right humeral nailing by [name] due to pathologic fracture on 10/10/23 --Radiation ***   She was supposed to start palliative systemic immunotherapy with Keytruda 200 mg IV every 3 weeks earlier this month in March 2025 but the patient is noncompliant with appointments.  Unclear if she is interested in treatment.  The patient was recently discharged from the hospital.  She received additional palliative radiation to the right humerus which was completed on 11/30/2023.  The patient also missed several of her education appointments to talk about her treatment in more detail.  The patient was seen with [name] today.  Revisited that her treatment is not chemotherapy and is immunotherapy.  We discussed the adverse side effects of  treatment.  In general this is well-tolerated but the potential adverse side effects can be serious which is why we review them with the patient so she knows what to monitor for.  The patient is also not interested in chemotherapy due to concerns with quality of life.  However the patient has refused palliative care and hospice.  After reviewing immunotherapy the patient decided that ***   Follow-up?  The patient understands that while she is feeling well at this time that we do expect continued progression of her malignancy which may cause more symptoms and performance status concerns which may affect her eligibility for treatment in the future.  ***Social work and transportation.   The patient was advised to call immediately if she has any concerning symptoms in the interval. The patient voices understanding of current disease status and treatment options and is in agreement with the current care plan. All questions were answered. The patient knows to call the clinic with any problems, questions or concerns. We can certainly see the patient much sooner if necessary    [name] orders of the defined types were placed in this encounter.  I spent {CHL ONC TIME VISIT - ZOXWR:[id number]} counseling the patient face to face. The total time spent in the appointment was {CHL ONC TIME VISIT - BJYNW:[id number]}.  Regina [name] L Apryle Stowell, PA-C 11/29/23

## 2023-11-29 NOTE — Transitions of Care (Post Inpatient/ED Visit) (Signed)
   11/29/2023  Name: Tomeeka Plaugher MRN: [id number] DOB: 1965-12-14  Today's TOC FU Call Status: Today's TOC FU Call Status:: Unsuccessful Call (2nd Attempt) Unsuccessful Call (2nd Attempt) Date: 11/29/23  Attempted to reach the patient regarding the most recent Inpatient/ED visit.  Follow Up Plan: Additional outreach attempts will be made to reach the patient to complete the Transitions of Care (Post Inpatient/ED visit) call.   Alyzza Andringa A. Mliss Fritz RN, BA, [facility], CRRN [facility] RN Care Manager, Transition of Care [phone]

## 2023-11-29 NOTE — Transitions of Care (Post Inpatient/ED Visit) (Signed)
   11/29/2023  Name: Regina [name] MRN: [id number] DOB: 03/05/1966  Today's TOC FU Call Status: Today's TOC FU Call Status:: Unsuccessful Call (2nd Attempt) Unsuccessful Call (1st Attempt) Date: 11/28/23 Unsuccessful Call (2nd Attempt) Date: 11/29/23  Attempted to reach the patient regarding the most recent Inpatient/ED visit.  Follow Up Plan: Additional outreach attempts will be made to reach the patient to complete the Transitions of Care (Post Inpatient/ED visit) call.   Signature  Robyne Peers, RN

## 2023-11-30 ENCOUNTER — Telehealth: Payer: Self-pay

## 2023-11-30 NOTE — Transitions of Care (Post Inpatient/ED Visit) (Signed)
   11/30/2023  Name: Regina [name] MRN: [id number] DOB: 01-02-1966  Today's TOC FU Call Status: Today's TOC FU Call Status:: Unsuccessful Call (3rd Attempt) Unsuccessful Call (3rd Attempt) Date: 11/30/23  Attempted to reach the patient regarding the most recent Inpatient/ED visit.  Follow Up Plan: No further outreach attempts will be made at this time. We have been unable to contact the patient.  Koray Soter A. Mliss Fritz RN, BA, [facility], CRRN [facility] RN Care Manager, Transition of Care [phone]

## 2023-12-01 ENCOUNTER — Encounter: Payer: Self-pay | Admitting: Internal Medicine

## 2023-12-01 ENCOUNTER — Inpatient Hospital Stay: Payer: MEDICAID | Admitting: Physician Assistant

## 2023-12-01 ENCOUNTER — Encounter (INDEPENDENT_AMBULATORY_CARE_PROVIDER_SITE_OTHER): Payer: Self-pay | Admitting: Primary Care

## 2023-12-01 ENCOUNTER — Inpatient Hospital Stay: Payer: MEDICAID

## 2023-12-01 ENCOUNTER — Telehealth (INDEPENDENT_AMBULATORY_CARE_PROVIDER_SITE_OTHER): Payer: Self-pay

## 2023-12-01 ENCOUNTER — Encounter

## 2023-12-01 NOTE — Progress Notes (Signed)
 I reached out to the pt on her mobile number to confirm whether or not she was able to make it to her appt tomorrow 3/27 at 10:30 for labs and 11:00 to see Cassie. Pt will not be getting an infusion tomorrow after her follow ups as she has not received chemotherapy education yet. Pt states she can make it as long as someone can get her there. I emailed Christian Vilsaint at 1:32 to see if the pt could get transportation the next day. Received an email back from [place] at 4:04pm that Ephriam Knuckles was able to get a hold of the pt and transportation has been arranged.

## 2023-12-01 NOTE — Progress Notes (Signed)
 I reached out to the pt to check on her well being as she has missed her appts this morning. Left VM and my number requesting she reach back out to me.

## 2023-12-01 NOTE — Telephone Encounter (Signed)
 Copied from CRM [id number]. Topic: General - Other >> Dec 01, 2023 10:05 AM Patsy Lager T wrote: Reason for CRM: Noreene Larsson nurse from [place] called stated she spoke with patient who does not have a clear understanding of her cancer and why she is having right arm pain. Noreene Larsson is asking that provider call patient to encourage her to go to her appt today. Noreene Larsson said patient was just discharged from hospital on March 21st and may also need a HFU appt. Patient told Noreene Larsson she has no family nor anyone to help her. Please f/u with patient

## 2023-12-02 ENCOUNTER — Other Ambulatory Visit: Payer: Self-pay

## 2023-12-05 ENCOUNTER — Encounter: Payer: Self-pay | Admitting: Internal Medicine

## 2023-12-05 ENCOUNTER — Other Ambulatory Visit: Payer: Self-pay

## 2023-12-05 NOTE — Progress Notes (Signed)
 Attempted to reach pt at her mobile number. No answer. LVM requesting a call back.  I also reached out to the pt's niece, [name], to see if she has had any contact with the pt. No answer. Left VM.

## 2023-12-06 ENCOUNTER — Other Ambulatory Visit: Payer: Self-pay

## 2023-12-06 ENCOUNTER — Emergency Department (HOSPITAL_COMMUNITY)
Admission: EM | Admit: 2023-12-06 | Discharge: 2023-12-06 | Disposition: A | Discharge status: Home / Self Care | Payer: MEDICAID

## 2023-12-06 ENCOUNTER — Emergency Department (HOSPITAL_COMMUNITY): Payer: MEDICAID

## 2023-12-06 ENCOUNTER — Telehealth: Payer: Self-pay

## 2023-12-06 ENCOUNTER — Encounter (HOSPITAL_COMMUNITY): Payer: Self-pay

## 2023-12-06 ENCOUNTER — Telehealth: Payer: Self-pay | Admitting: Internal Medicine

## 2023-12-06 ENCOUNTER — Encounter: Payer: Self-pay | Admitting: Internal Medicine

## 2023-12-06 DIAGNOSIS — Z79899 Other long term (current) drug therapy: Secondary | ICD-10-CM | POA: Insufficient documentation

## 2023-12-06 DIAGNOSIS — M25552 Pain in left hip: Secondary | ICD-10-CM | POA: Diagnosis present

## 2023-12-06 DIAGNOSIS — J449 Chronic obstructive pulmonary disease, unspecified: Secondary | ICD-10-CM | POA: Diagnosis not present

## 2023-12-06 DIAGNOSIS — Z85118 Personal history of other malignant neoplasm of bronchus and lung: Secondary | ICD-10-CM | POA: Insufficient documentation

## 2023-12-06 DIAGNOSIS — I10 Essential (primary) hypertension: Secondary | ICD-10-CM | POA: Diagnosis not present

## 2023-12-06 LAB — CBC WITH DIFFERENTIAL/PLATELET
Abs Immature Granulocytes: 0.05 10*3/uL (ref 0.00–0.07)
Basophils Absolute: 0.1 10*3/uL (ref 0.0–0.1)
Basophils Relative: 1 %
Eosinophils Absolute: 0.2 10*3/uL (ref 0.0–0.5)
Eosinophils Relative: 2 %
HCT: 40 % (ref 36.0–46.0)
Hemoglobin: 13.1 g/dL (ref 12.0–15.0)
Immature Granulocytes: 1 %
Lymphocytes Relative: 34 %
Lymphs Abs: 2.8 10*3/uL (ref 0.7–4.0)
MCH: 28.1 pg (ref 26.0–34.0)
MCHC: 32.8 g/dL (ref 30.0–36.0)
MCV: 85.8 fL (ref 80.0–100.0)
Monocytes Absolute: 0.8 10*3/uL (ref 0.1–1.0)
Monocytes Relative: 10 %
Neutro Abs: 4.2 10*3/uL (ref 1.7–7.7)
Neutrophils Relative %: 52 %
Platelets: 816 10*3/uL — ABNORMAL HIGH (ref 150–400)
RBC: 4.66 MIL/uL (ref 3.87–5.11)
RDW: 15.5 % (ref 11.5–15.5)
WBC: 8.1 10*3/uL (ref 4.0–10.5)
nRBC: 0 % (ref 0.0–0.2)

## 2023-12-06 LAB — BASIC METABOLIC PANEL WITH GFR
Anion gap: 12 (ref 5–15)
BUN: <5 mg/dL — ABNORMAL LOW (ref 6–20)
CO2: 22 mmol/L (ref 22–32)
Calcium: 9 mg/dL (ref 8.9–10.3)
Chloride: 99 mmol/L (ref 98–111)
Creatinine, Ser: 0.55 mg/dL (ref 0.44–1.00)
GFR, Estimated: >60 mL/min (ref >60)
Glucose, Bld: 82 mg/dL (ref 70–99)
Potassium: 3.4 mmol/L — ABNORMAL LOW (ref 3.5–5.1)
Sodium: 133 mmol/L — ABNORMAL LOW (ref 135–145)

## 2023-12-06 MED ORDER — OXYCODONE-ACETAMINOPHEN 5-325 MG PO TABS
1.0000 | ORAL_TABLET | Freq: Four times a day (QID) | ORAL | 0 refills | Status: DC | PRN
  Filled 2023-12-06: qty 15, 4d supply, fill #0

## 2023-12-06 MED ORDER — MORPHINE SULFATE (PF) 4 MG/ML IV SOLN
4.0000 mg | Freq: Once | INTRAVENOUS | Status: AC
  Administered 2023-12-06: 4 mg via INTRAVENOUS
  Filled 2023-12-06: qty 1

## 2023-12-06 MED ORDER — ONDANSETRON HCL 4 MG/2ML IJ SOLN
4.0000 mg | Freq: Once | INTRAMUSCULAR | Status: AC
  Administered 2023-12-06: 4 mg via INTRAVENOUS
  Filled 2023-12-06: qty 2

## 2023-12-06 NOTE — ED Provider Notes (Signed)
[place] EMERGENCY DEPARTMENT AT [facility] Provider Note   CSN: [id number] Arrival date & time: 12/06/23  1719     History  Chief Complaint  Patient presents with   Hip Pain    Regina [name] is a 58 y.o. female.  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  The patient states that this been going now for the past 3 weeks.  She denies any known injuries.  Denies any fevers.  She is not currently being treated for her lung cancer at this point.  She came today for further evaluation regarding this.   Hip Pain       Home Medications Prior to Admission medications   Medication Sig Start Date End Date Taking? Authorizing Provider  oxyCODONE-acetaminophen (PERCOCET/ROXICET) 5-325 MG tablet Take 1 tablet by mouth every 6 (six) hours as needed for severe pain (pain score 7-10). 12/06/23  Yes Durwin Glaze, MD  acetaminophen (TYLENOL) 325 MG tablet Take 2 tablets (650 mg total) by mouth every 6 (six) hours as needed for mild pain (pain score 1-3) (or Fever >/= 101). 09/20/23   Azucena Fallen, MD  amLODipine (NORVASC) 5 MG tablet Take 1 tablet (5 mg total) by mouth daily. 11/01/23   [name], NP      Allergies    Pork allergy and Bee venom    Review of Systems   Review of Systems  Musculoskeletal:  Positive for arthralgias.  All other systems reviewed and are negative.   Physical Exam Updated Vital Signs BP 135/69 (BP Location: Left Arm)   Pulse 85   Temp 98.7 F (37.1 C) (Oral)   Resp 17   Ht 5\' 6"  (1.676 m)   Wt 55.8 kg   LMP 06/06/2010   SpO2 91%   BMI 19.85 kg/m  Physical Exam Vitals and nursing note reviewed.   Gen: NAD Eyes: PERRL, EOMI HEENT: no oropharyngeal swelling Neck: trachea midline Resp: clear to auscultation bilaterally Card: RRR, no murmurs, rubs, or gallops Abd: nontender,  nondistended Extremities: Tender over the left proximal femur with no obvious deformity noted, no overlying erythema or warmth, no significant pain with passive range of motion Vascular: 2+ radial pulses bilaterally, 2+ DP pulses bilaterally Skin: no rashes Psyc: acting appropriately   ED Results / Procedures / Treatments   Labs (all labs ordered are listed, but only abnormal results are displayed) Labs Reviewed  CBC WITH DIFFERENTIAL/PLATELET - Abnormal; Notable for the following components:      Result Value   Platelets 816 (*)    All other components within normal limits  BASIC METABOLIC PANEL WITH GFR - Abnormal; Notable for the following components:   Sodium 133 (*)    Potassium 3.4 (*)    BUN <5 (*)    All other components within normal limits    EKG None  Radiology CT Lumbar Spine Wo Contrast Result Date: 12/06/2023 CLINICAL DATA:  Low back pain EXAM: CT LUMBAR SPINE WITHOUT CONTRAST TECHNIQUE: Multidetector CT imaging of the lumbar spine was performed without intravenous contrast administration. Multiplanar CT image reconstructions were also generated. RADIATION DOSE REDUCTION: This exam was performed according to the departmental dose-optimization program which includes automated exposure control, adjustment of the mA and/or kV according to patient size and/or use of iterative reconstruction technique. COMPARISON:  CT 09/17/2023 FINDINGS: Segmentation: Partial sacralization of the L5 vertebral body. Alignment: Normal  Vertebrae: No acute fracture or focal pathologic process. Paraspinal and other soft tissues: Negative. 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 Disc levels: Maintained IMPRESSION: No acute bony abnormality. Electronically Signed   By: Charlett Nose M.D.   On: 12/06/2023 20:30   CT FEMUR LEFT WO CONTRAST Result Date: 12/06/2023 CLINICAL DATA:  Hip pain EXAM: CT OF THE LOWER LEFT EXTREMITY WITHOUT CONTRAST TECHNIQUE: Multidetector CT imaging of the lower left extremity was  performed according to the standard protocol. RADIATION DOSE REDUCTION: This exam was performed according to the departmental dose-optimization program which includes automated exposure control, adjustment of the mA and/or kV according to patient size and/or use of iterative reconstruction technique. COMPARISON:  Plain films today. FINDINGS: Bones/Joint/Cartilage No acute bony abnormality. Specifically, no fracture, subluxation, or dislocation. No joint effusion. Ligaments Suboptimally assessed by CT. Muscles and Tendons Negative Soft tissues Negative IMPRESSION: No acute bony abnormality. Electronically Signed   By: Charlett Nose M.D.   On: 12/06/2023 20:27   DG Hip Unilat With Pelvis 2-3 Views Left Result Date: 12/06/2023 CLINICAL DATA:  Hip pain EXAM: DG HIP (WITH OR WITHOUT PELVIS) 2-3V LEFT COMPARISON:  None Available. FINDINGS: There is no evidence of hip fracture or dislocation. There is no evidence of arthropathy or other focal bone abnormality. IMPRESSION: Negative. Electronically Signed   By: Darliss Cheney M.D.   On: 12/06/2023 18:39    Procedures Procedures    Medications Ordered in ED Medications  morphine (PF) 4 MG/ML injection 4 mg (4 mg Intravenous Given 12/06/23 1957)  ondansetron (ZOFRAN) injection 4 mg (4 mg Intravenous Given 12/06/23 1957)    ED Course/ Medical Decision Making/ A&P                                 Medical Decision Making 58 year old female with past medical history of COPD and hypertension presenting to the emergency department today with left hip pain.  The patient's x-ray performed triage is negative.  The patient is tachycardic here and does appear to be in some distress with this.  I will obtain CT scans to evaluate for lytic lesions given the patient's known cancer history.  Also obtain ultrasound to eval for DVT.  Looks like she has had pain associated with some electrolyte abnormalities as well so we will obtain basic labs here.  I will give patient morphine  Zofran for symptoms and reevaluate for ultimate disposition.  The patient CT scans are unremarkable.  Labs are reassuring.  On reassessment the patient is still having some pain although it is lot better after treatment here.  Her pain is mostly on the lateral aspect of the left hip.  She does not report any significant tenderness over the medial hip, popliteal fossa, or calf.  I do not think that DVT ultrasound is necessary at this time.  I think that she is stable for discharge.  Amount and/or Complexity of Data Reviewed Labs: ordered. Radiology: ordered.  Risk Prescription drug management.           Final Clinical Impression(s) / ED Diagnoses Final diagnoses:  Left hip pain    Rx / DC Orders ED Discharge Orders          Ordered    oxyCODONE-acetaminophen (PERCOCET/ROXICET) 5-325 MG tablet  Every 6 hours PRN        12/06/23 2250              Durwin Glaze, MD  12/06/23 2251  

## 2023-12-06 NOTE — ED Triage Notes (Signed)
 Pt. Arrives pov c/o left hip pain. States that it's been going on for a while but hurts worse today.

## 2023-12-06 NOTE — Discharge Instructions (Signed)
 Take the Percocet as needed for pain.  Your workup today was reassuring.  Do not drive drink alcohol take the medication as it may make you drowsy.  Follow-up with your doctor.  Return to the ER for worsening symptoms.

## 2023-12-06 NOTE — Telephone Encounter (Signed)
 Contacted the niece to see if the patient could be reached another way. She stated she only had the one phone number and would attempt to reach her as well.

## 2023-12-06 NOTE — Telephone Encounter (Signed)
 Attempted to contact the patient. Left a voicemail to please call us back.

## 2023-12-06 NOTE — Progress Notes (Signed)
 Pts niece, [name], called me to let me know she was able to get a hold of the pt. [name] didn't mention to the pt that we had spoken earlier in the day to let her ([name]) know that we hadn't be able to reach the pt. Pt then mentioned to [name] that she was having a lot of pain in her leg. Pt didn't specify which leg, and told raycia she'll probably be going to the ER tonight after a family member can come over to stay with the pt's brother. I let know [name] know that I appreciated her letting me know the pt's current situation. I let her know I will check on the pt later today or tomorrow.

## 2023-12-07 ENCOUNTER — Telehealth: Payer: Self-pay

## 2023-12-07 ENCOUNTER — Other Ambulatory Visit: Payer: Self-pay

## 2023-12-07 NOTE — Telephone Encounter (Signed)
 Called pt to remind her of her Palliative care appt tomorrow 4/3 at 11:00am. No answer. Left VM with appt details and my direct number to call with any questions.

## 2023-12-08 ENCOUNTER — Inpatient Hospital Stay: Payer: MEDICAID | Attending: Internal Medicine | Admitting: Nurse Practitioner

## 2023-12-10 ENCOUNTER — Emergency Department (HOSPITAL_COMMUNITY)
Admission: EM | Admit: 2023-12-10 | Discharge: 2023-12-10 | Disposition: A | Discharge status: Home / Self Care | Payer: MEDICAID | Attending: Emergency Medicine | Admitting: Emergency Medicine

## 2023-12-10 ENCOUNTER — Other Ambulatory Visit: Payer: Self-pay

## 2023-12-10 DIAGNOSIS — Z85830 Personal history of malignant neoplasm of bone: Secondary | ICD-10-CM | POA: Diagnosis not present

## 2023-12-10 DIAGNOSIS — I10 Essential (primary) hypertension: Secondary | ICD-10-CM | POA: Insufficient documentation

## 2023-12-10 DIAGNOSIS — M5432 Sciatica, left side: Secondary | ICD-10-CM | POA: Insufficient documentation

## 2023-12-10 DIAGNOSIS — M25552 Pain in left hip: Secondary | ICD-10-CM | POA: Diagnosis present

## 2023-12-10 DIAGNOSIS — Z85841 Personal history of malignant neoplasm of brain: Secondary | ICD-10-CM | POA: Diagnosis not present

## 2023-12-10 DIAGNOSIS — J449 Chronic obstructive pulmonary disease, unspecified: Secondary | ICD-10-CM | POA: Insufficient documentation

## 2023-12-10 DIAGNOSIS — Z85118 Personal history of other malignant neoplasm of bronchus and lung: Secondary | ICD-10-CM | POA: Diagnosis not present

## 2023-12-10 LAB — CBC WITH DIFFERENTIAL/PLATELET
Abs Immature Granulocytes: 0.06 10*3/uL (ref 0.00–0.07)
Basophils Absolute: 0.1 10*3/uL (ref 0.0–0.1)
Basophils Relative: 2 %
Eosinophils Absolute: 0.2 10*3/uL (ref 0.0–0.5)
Eosinophils Relative: 2 %
HCT: 42 % (ref 36.0–46.0)
Hemoglobin: 13.8 g/dL (ref 12.0–15.0)
Immature Granulocytes: 1 %
Lymphocytes Relative: 31 %
Lymphs Abs: 3 10*3/uL (ref 0.7–4.0)
MCH: 28.2 pg (ref 26.0–34.0)
MCHC: 32.9 g/dL (ref 30.0–36.0)
MCV: 85.9 fL (ref 80.0–100.0)
Monocytes Absolute: 0.9 10*3/uL (ref 0.1–1.0)
Monocytes Relative: 9 %
Neutro Abs: 5.3 10*3/uL (ref 1.7–7.7)
Neutrophils Relative %: 55 %
Platelets: 704 10*3/uL — ABNORMAL HIGH (ref 150–400)
RBC: 4.89 MIL/uL (ref 3.87–5.11)
RDW: 15.9 % — ABNORMAL HIGH (ref 11.5–15.5)
WBC: 9.6 10*3/uL (ref 4.0–10.5)
nRBC: 0 % (ref 0.0–0.2)

## 2023-12-10 LAB — COMPREHENSIVE METABOLIC PANEL WITH GFR
ALT: 12 U/L (ref 0–44)
AST: 30 U/L (ref 15–41)
Albumin: 4 g/dL (ref 3.5–5.0)
Alkaline Phosphatase: 87 U/L (ref 38–126)
Anion gap: 18 — ABNORMAL HIGH (ref 5–15)
BUN: 6 mg/dL (ref 6–20)
CO2: 17 mmol/L — ABNORMAL LOW (ref 22–32)
Calcium: 9.4 mg/dL (ref 8.9–10.3)
Chloride: 95 mmol/L — ABNORMAL LOW (ref 98–111)
Creatinine, Ser: 0.73 mg/dL (ref 0.44–1.00)
GFR, Estimated: >60 mL/min (ref >60)
Glucose, Bld: 68 mg/dL — ABNORMAL LOW (ref 70–99)
Potassium: 3.7 mmol/L (ref 3.5–5.1)
Sodium: 130 mmol/L — ABNORMAL LOW (ref 135–145)
Total Bilirubin: 1.3 mg/dL — ABNORMAL HIGH (ref 0.0–1.2)
Total Protein: 8.2 g/dL — ABNORMAL HIGH (ref 6.5–8.1)

## 2023-12-10 LAB — SEDIMENTATION RATE: Sed Rate: 11 mm/h (ref 0–22)

## 2023-12-10 MED ORDER — CYCLOBENZAPRINE HCL 10 MG PO TABS: 10.0000 mg | ORAL_TABLET | Freq: Every day | ORAL | 0 refills | Status: DC

## 2023-12-10 MED ORDER — OXYCODONE-ACETAMINOPHEN 5-325 MG PO TABS
1.0000 | ORAL_TABLET | Freq: Once | ORAL | Status: AC
  Administered 2023-12-10: 1 via ORAL
  Filled 2023-12-10: qty 1

## 2023-12-10 MED ORDER — DICLOFENAC SODIUM 1 % EX GEL: 2.0000 g | Freq: Four times a day (QID) | CUTANEOUS | 0 refills | Status: DC

## 2023-12-10 NOTE — Discharge Instructions (Signed)
 You were seen in the emergency department today for concerns of hip pain.  Your labs were thankfully reassuring without abnormality seen.  Since he had thorough imaging performed a few days ago on your last visit in the emergency department, no repeat imaging was performed today.  I would recommend following up with your primary care provider for further evaluation for possible neurosurgery follow-up if your symptoms are not improving after a total duration of 6 weeks from symptom onset.  I have sent a prescription to your pharmacy for a muscle relaxer and a topical anti-inflammatory.  Please use these as prescribed.  For any concerns of new or worsening symptoms, return to the emergency department.

## 2023-12-10 NOTE — ED Triage Notes (Signed)
 Pt arrived via POV. C/o L hip pain for 3x weeks. Seen recently for same.  Weight bearing and able to ambulate independently

## 2023-12-10 NOTE — ED Provider Notes (Signed)
 [place] EMERGENCY DEPARTMENT AT [facility] Provider Note   CSN: [id number] Arrival date & time: 12/10/23  1524     History Chief Complaint  Patient presents with   Hip Pain    Regina [name] is a 58 y.o. female.  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  Patient reportedly seen in the emergency department few days ago with thorough workup including x-ray, CT imaging, and labs to assess her hip pain.  Workup at that time was unremarkable.  Patient reports that she is still having persistent pain which typically worsens with ambulation.  She describes the pain as being located deep in the left hip with some radiating pain into the left lower extremity.  She denies any feelings of weakness, tingling, or numbness.  Denies saddle paresthesia.  Denies bowel or bladder incontinence.  No recent injury or trauma to the area.   Hip Pain       Home Medications Prior to Admission medications   Medication Sig Start Date End Date Taking? Authorizing Provider  cyclobenzaprine (FLEXERIL) 10 MG tablet Take 1 tablet (10 mg total) by mouth at bedtime for 10 days. 12/10/23 12/20/23 Yes Smitty Knudsen, PA-C  diclofenac Sodium (VOLTAREN) 1 % GEL Apply 2 g topically 4 (four) times daily. 12/10/23  Yes Smitty Knudsen, PA-C  acetaminophen (TYLENOL) 325 MG tablet Take 2 tablets (650 mg total) by mouth every 6 (six) hours as needed for mild pain (pain score 1-3) (or Fever >/= 101). 09/20/23   Azucena Fallen, MD  amLODipine (NORVASC) 5 MG tablet Take 1 tablet (5 mg total) by mouth daily. 11/01/23   [name], NP  oxyCODONE-acetaminophen (PERCOCET/ROXICET) 5-325 MG tablet Take 1 tablet by mouth every 6 (six) hours as needed for severe pain (pain score 7-10). 12/06/23   Durwin Glaze, MD      Allergies    Pork allergy and  Bee venom    Review of Systems   Review of Systems  Musculoskeletal:  Positive for back pain.       Hip pain  All other systems reviewed and are negative.   Physical Exam Updated Vital Signs BP (!) 133/108   Pulse 90   Temp 98.6 F (37 C) (Oral)   Resp 15   LMP 06/06/2010   SpO2 99%  Physical Exam Vitals and nursing note reviewed.  Constitutional:      General: She is not in acute distress.    Appearance: She is well-developed.  HENT:     Head: Normocephalic and atraumatic.  Eyes:     Conjunctiva/sclera: Conjunctivae normal.  Cardiovascular:     Rate and Rhythm: Normal rate and regular rhythm.     Heart sounds: No murmur heard. Pulmonary:     Effort: Pulmonary effort is normal. No respiratory distress.     Breath sounds: Normal breath sounds.  Abdominal:     Palpations: Abdomen is soft.     Tenderness: There is no abdominal tenderness.  Musculoskeletal:        General: Tenderness present. No swelling, deformity or signs of injury. Normal range of motion.     Cervical back: Neck supple.       Legs:     Comments: TTP deep in the left gluteal region with no obvious bony prominence tenderness or bruising. Positive straight leg raise to the  left.  Skin:    General: Skin is warm and dry.     Capillary Refill: Capillary refill takes less than 2 seconds.  Neurological:     Mental Status: She is alert.  Psychiatric:        Mood and Affect: Mood normal.     ED Results / Procedures / Treatments   Labs (all labs ordered are listed, but only abnormal results are displayed) Labs Reviewed  CBC WITH DIFFERENTIAL/PLATELET - Abnormal; Notable for the following components:      Result Value   RDW 15.9 (*)    Platelets 704 (*)    All other components within normal limits  COMPREHENSIVE METABOLIC PANEL WITH GFR - Abnormal; Notable for the following components:   Sodium 130 (*)    Chloride 95 (*)    CO2 17 (*)    Glucose, Bld 68 (*)    Total Protein 8.2 (*)    Total  Bilirubin 1.3 (*)    Anion gap 18 (*)    All other components within normal limits  SEDIMENTATION RATE  C-REACTIVE PROTEIN    EKG EKG Interpretation Date/Time:  Saturday December 10 2023 16:33:21 EDT Ventricular Rate:  126 PR Interval:  160 QRS Duration:  81 QT Interval:  315 QTC Calculation: 456 R Axis:   2  Text Interpretation: Sinus tachycardia Left atrial enlargement Confirmed by Ernie Avena ([id number]) on 12/10/2023 5:11:27 PM  Radiology No results found.  Procedures Procedures    Medications Ordered in ED Medications  oxyCODONE-acetaminophen (PERCOCET/ROXICET) 5-325 MG per tablet 1 tablet (1 tablet Oral Given 12/10/23 1651)  oxyCODONE-acetaminophen (PERCOCET/ROXICET) 5-325 MG per tablet 1 tablet (1 tablet Oral Given 12/10/23 2009)    ED Course/ Medical Decision Making/ A&P                                 Medical Decision Making Amount and/or Complexity of Data Reviewed Labs: ordered.  Risk Prescription drug management.   This patient presents to the ED for concern of hip pain.  Differential diagnosis includes low back pain, sciatica, pelvic abscess, pelvic mass    Additional history obtained:  Additional history obtained from prior ED visit from 12/06/2023 External records from outside source obtained and reviewed including labs and imaging workup   Lab Tests:  I Ordered, and personally interpreted labs.  The pertinent results include: CBC unremarkable, CMP with mild signs of dehydration with hyponatremia at 130, sedimentation rate normal at 11, CRP pending as this is a send out lab   Medicines ordered and prescription drug management:  I ordered medication including Percocet for pain  Reevaluation of the patient after these medicines showed that the patient improved I have reviewed the patients home medicines and have made adjustments as needed   Problem List / ED Course:  Patient presents to the emergency department today with concerns of hip pain.  She  reports that this has been ongoing for the last 3 weeks or so and denies any inciting factors.  She was seen in the emergency department a few days ago for similar concerns and had a thorough workup with CT imaging to rule out more concerning findings with no abnormalities found.  She reports the pain starts in the left gluteus with radiation down towards the lower extremity.  Denies any tingling or numbness.  Denies any cold limb. On physical exam, there is tenderness to palpation deep into the left gluteal muscle  likely indicating irritation of the sciatic nerve.  There is also a positive straight leg raise to the left leg.  Will obtain a sedimentation rate and C-reactive protein for further assessment for possible AVN or other cause of pain that may necessitate MRI imaging.  Equal pulses in extremities. Labs unremarkable.  Sedimentation rate normal.  CRP pending.  Given this reassuring findings, advised patient that she is stable for outpatient follow-up and continue use of anti-inflammatory medication.  Return precautions discussed.  Final Clinical Impression(s) / ED Diagnoses Final diagnoses:  Sciatica of left side    Rx / DC Orders ED Discharge Orders          Ordered    diclofenac Sodium (VOLTAREN) 1 % GEL  4 times daily        12/10/23 2101    cyclobenzaprine (FLEXERIL) 10 MG tablet  Daily at bedtime        12/10/23 2101              Smitty Knudsen, PA-C 12/10/23 2309    Ernie Avena, MD 12/10/23 2319

## 2023-12-12 ENCOUNTER — Emergency Department (HOSPITAL_COMMUNITY): Payer: MEDICAID

## 2023-12-12 ENCOUNTER — Inpatient Hospital Stay (HOSPITAL_COMMUNITY)
Admission: EM | Admit: 2023-12-12 | Discharge: 2023-12-15 | DRG: 080 | Disposition: A | Discharge status: Home / Self Care | Payer: MEDICAID | Attending: Internal Medicine | Admitting: Internal Medicine

## 2023-12-12 DIAGNOSIS — Z85118 Personal history of other malignant neoplasm of bronchus and lung: Secondary | ICD-10-CM

## 2023-12-12 DIAGNOSIS — I10 Essential (primary) hypertension: Secondary | ICD-10-CM | POA: Diagnosis present

## 2023-12-12 DIAGNOSIS — E872 Acidosis, unspecified: Secondary | ICD-10-CM | POA: Diagnosis present

## 2023-12-12 DIAGNOSIS — D869 Sarcoidosis, unspecified: Secondary | ICD-10-CM | POA: Diagnosis present

## 2023-12-12 DIAGNOSIS — Z87891 Personal history of nicotine dependence: Secondary | ICD-10-CM

## 2023-12-12 DIAGNOSIS — E876 Hypokalemia: Secondary | ICD-10-CM | POA: Diagnosis present

## 2023-12-12 DIAGNOSIS — Z8249 Family history of ischemic heart disease and other diseases of the circulatory system: Secondary | ICD-10-CM

## 2023-12-12 DIAGNOSIS — R4701 Aphasia: Secondary | ICD-10-CM | POA: Diagnosis present

## 2023-12-12 DIAGNOSIS — R636 Underweight: Secondary | ICD-10-CM | POA: Diagnosis present

## 2023-12-12 DIAGNOSIS — Z923 Personal history of irradiation: Secondary | ICD-10-CM

## 2023-12-12 DIAGNOSIS — Z91030 Bee allergy status: Secondary | ICD-10-CM

## 2023-12-12 DIAGNOSIS — Z8616 Personal history of COVID-19: Secondary | ICD-10-CM | POA: Diagnosis present

## 2023-12-12 DIAGNOSIS — Z681 Body mass index (BMI) 19 or less, adult: Secondary | ICD-10-CM

## 2023-12-12 DIAGNOSIS — C7931 Secondary malignant neoplasm of brain: Secondary | ICD-10-CM | POA: Diagnosis present

## 2023-12-12 DIAGNOSIS — C7951 Secondary malignant neoplasm of bone: Secondary | ICD-10-CM | POA: Diagnosis present

## 2023-12-12 DIAGNOSIS — G936 Cerebral edema: Principal | ICD-10-CM | POA: Diagnosis present

## 2023-12-12 DIAGNOSIS — M84421D Pathological fracture, right humerus, subsequent encounter for fracture with routine healing: Secondary | ICD-10-CM | POA: Diagnosis present

## 2023-12-12 DIAGNOSIS — Z91014 Allergy to mammalian meats: Secondary | ICD-10-CM

## 2023-12-12 DIAGNOSIS — R6510 Systemic inflammatory response syndrome (SIRS) of non-infectious origin without acute organ dysfunction: Secondary | ICD-10-CM | POA: Diagnosis present

## 2023-12-12 DIAGNOSIS — G9341 Metabolic encephalopathy: Principal | ICD-10-CM | POA: Diagnosis present

## 2023-12-12 DIAGNOSIS — C3411 Malignant neoplasm of upper lobe, right bronchus or lung: Secondary | ICD-10-CM | POA: Diagnosis present

## 2023-12-12 DIAGNOSIS — U071 COVID-19: Secondary | ICD-10-CM | POA: Diagnosis present

## 2023-12-12 DIAGNOSIS — J439 Emphysema, unspecified: Secondary | ICD-10-CM | POA: Diagnosis present

## 2023-12-12 DIAGNOSIS — Z79899 Other long term (current) drug therapy: Secondary | ICD-10-CM

## 2023-12-12 DIAGNOSIS — Z806 Family history of leukemia: Secondary | ICD-10-CM

## 2023-12-12 DIAGNOSIS — E871 Hypo-osmolality and hyponatremia: Secondary | ICD-10-CM | POA: Diagnosis present

## 2023-12-12 LAB — CBC WITH DIFFERENTIAL/PLATELET
Abs Immature Granulocytes: 0.15 10*3/uL — ABNORMAL HIGH (ref 0.00–0.07)
Basophils Absolute: 0.1 10*3/uL (ref 0.0–0.1)
Basophils Relative: 1 %
Eosinophils Absolute: 0.1 10*3/uL (ref 0.0–0.5)
Eosinophils Relative: 0 %
HCT: 42.2 % (ref 36.0–46.0)
Hemoglobin: 14 g/dL (ref 12.0–15.0)
Immature Granulocytes: 1 %
Lymphocytes Relative: 11 %
Lymphs Abs: 1.6 10*3/uL (ref 0.7–4.0)
MCH: 28.1 pg (ref 26.0–34.0)
MCHC: 33.2 g/dL (ref 30.0–36.0)
MCV: 84.7 fL (ref 80.0–100.0)
Monocytes Absolute: 0.9 10*3/uL (ref 0.1–1.0)
Monocytes Relative: 6 %
Neutro Abs: 11.4 10*3/uL — ABNORMAL HIGH (ref 1.7–7.7)
Neutrophils Relative %: 81 %
Platelets: 556 10*3/uL — ABNORMAL HIGH (ref 150–400)
RBC: 4.98 MIL/uL (ref 3.87–5.11)
RDW: 15.9 % — ABNORMAL HIGH (ref 11.5–15.5)
WBC: 14.2 10*3/uL — ABNORMAL HIGH (ref 4.0–10.5)
nRBC: 0 % (ref 0.0–0.2)

## 2023-12-12 LAB — RESP PANEL BY RT-PCR (RSV, FLU A&B, COVID)  RVPGX2
Influenza A by PCR: NEGATIVE
Influenza B by PCR: NEGATIVE
Resp Syncytial Virus by PCR: NEGATIVE
SARS Coronavirus 2 by RT PCR: POSITIVE — AB

## 2023-12-12 LAB — URINALYSIS, ROUTINE W REFLEX MICROSCOPIC
Bilirubin Urine: NEGATIVE
Glucose, UA: NEGATIVE mg/dL
Hgb urine dipstick: NEGATIVE
Ketones, ur: 5 mg/dL — AB
Leukocytes,Ua: NEGATIVE
Nitrite: NEGATIVE
Protein, ur: NEGATIVE mg/dL
Specific Gravity, Urine: 1.004 — ABNORMAL LOW (ref 1.005–1.030)
pH: 6 (ref 5.0–8.0)

## 2023-12-12 LAB — I-STAT CG4 LACTIC ACID, ED
Lactic Acid, Venous: 3.7 mmol/L (ref 0.5–1.9)
Lactic Acid, Venous: 2.3 mmol/L (ref 0.5–1.9)

## 2023-12-12 LAB — COMPREHENSIVE METABOLIC PANEL WITH GFR
ALT: 14 U/L (ref 0–44)
AST: 33 U/L (ref 15–41)
Albumin: 3.7 g/dL (ref 3.5–5.0)
Alkaline Phosphatase: 74 U/L (ref 38–126)
Anion gap: 17 — ABNORMAL HIGH (ref 5–15)
BUN: <5 mg/dL — ABNORMAL LOW (ref 6–20)
CO2: 18 mmol/L — ABNORMAL LOW (ref 22–32)
Calcium: 10.1 mg/dL (ref 8.9–10.3)
Chloride: 97 mmol/L — ABNORMAL LOW (ref 98–111)
Creatinine, Ser: 0.9 mg/dL (ref 0.44–1.00)
GFR, Estimated: >60 mL/min (ref >60)
Glucose, Bld: 141 mg/dL — ABNORMAL HIGH (ref 70–99)
Potassium: 3.4 mmol/L — ABNORMAL LOW (ref 3.5–5.1)
Sodium: 132 mmol/L — ABNORMAL LOW (ref 135–145)
Total Bilirubin: 1.2 mg/dL (ref 0.0–1.2)
Total Protein: 7.7 g/dL (ref 6.5–8.1)

## 2023-12-12 LAB — TROPONIN I (HIGH SENSITIVITY)
Troponin I (High Sensitivity): 15 ng/L (ref <18)
Troponin I (High Sensitivity): 25 ng/L — ABNORMAL HIGH (ref <18)

## 2023-12-12 LAB — MAGNESIUM: Magnesium: 1.4 mg/dL — ABNORMAL LOW (ref 1.7–2.4)

## 2023-12-12 LAB — D-DIMER, QUANTITATIVE: D-Dimer, Quant: >20 ug{FEU}/mL — ABNORMAL HIGH (ref 0.00–0.50)

## 2023-12-12 MED ORDER — MAGNESIUM SULFATE 2 GM/50ML IV SOLN
2.0000 g | Freq: Once | INTRAVENOUS | Status: AC
  Administered 2023-12-12: 2 g via INTRAVENOUS
  Filled 2023-12-12: qty 50

## 2023-12-12 MED ORDER — MORPHINE SULFATE (PF) 2 MG/ML IV SOLN
2.0000 mg | Freq: Once | INTRAVENOUS | Status: AC
  Administered 2023-12-12: 2 mg via INTRAVENOUS
  Filled 2023-12-12: qty 1

## 2023-12-12 MED ORDER — IOHEXOL 350 MG/ML SOLN
130.0000 mL | Freq: Once | INTRAVENOUS | Status: AC | PRN
  Administered 2023-12-12: 130 mL via INTRAVENOUS

## 2023-12-12 MED ORDER — SODIUM CHLORIDE 0.9 % IV BOLUS
1000.0000 mL | Freq: Once | INTRAVENOUS | Status: AC
  Administered 2023-12-12: 1000 mL via INTRAVENOUS

## 2023-12-12 NOTE — ED Notes (Signed)
 [facility]p notified of critical lab value

## 2023-12-12 NOTE — ED Provider Notes (Signed)
  EMERGENCY DEPARTMENT AT [facility] Provider Note   CSN: [id number] Arrival date & time: 12/12/23  1727     History {Add pertinent medical, surgical, social history, OB history to HPI:1} No chief complaint on file.   Regina [name] is a 58 y.o. female.  With a history of metastatic lung cancer with mets to bone and brain, COPD and hypertension who presents to the ED for confusion.  Per the patient's niece who does not live with her, she was last known to be her normal self last night (unable to specify time).  She lives with her son who noted her to be confused and in distress on the floor when he got home from work [employer].  Patient was complaining of left hip pain.  As of last night her speech was clear and fluent but is more confused today.  Niece states she had a similar presentation back in February where subsequent stereotactic [place] at [facility] was performed on February 6.  Patient herself reports pain in her left hip and right upper extremity.  She does have a known pathologic fracture of the right humerus.  No trauma today.  She denies chest pain, shortness of breath.  HPI     Home Medications Prior to Admission medications   Medication Sig Start Date End Date Taking? Authorizing Provider  acetaminophen (TYLENOL) 325 MG tablet Take 2 tablets (650 mg total) by mouth every 6 (six) hours as needed for mild pain (pain score 1-3) (or Fever >/= 101). 09/20/23   Azucena Fallen, MD  amLODipine (NORVASC) 5 MG tablet Take 1 tablet (5 mg total) by mouth daily. 11/01/23   [name], NP  cyclobenzaprine (FLEXERIL) 10 MG tablet Take 1 tablet (10 mg total) by mouth at bedtime for 10 days. 12/10/23 12/20/23  Smitty Knudsen, PA-C  diclofenac Sodium (VOLTAREN) 1 % GEL Apply 2 g topically 4 (four) times daily. 12/10/23   Smitty Knudsen, PA-C  oxyCODONE-acetaminophen (PERCOCET/ROXICET) 5-325 MG tablet Take 1 tablet by mouth every 6 (six) hours as needed for severe pain (pain  score 7-10). 12/06/23   Durwin Glaze, MD      Allergies    Pork allergy and Bee venom    Review of Systems   Review of Systems  Physical Exam Updated Vital Signs BP 137/84 (BP Location: Left Arm)   Temp 98.8 F (37.1 C) (Oral)   Resp (!) 30   LMP 06/06/2010   SpO2 96%  Physical Exam Vitals and nursing note reviewed.  HENT:     Head: Normocephalic and atraumatic.  Eyes:     Pupils: Pupils are equal, round, and reactive to light.  Cardiovascular:     Rate and Rhythm: Normal rate and regular rhythm.  Pulmonary:     Effort: Pulmonary effort is normal.     Breath sounds: Normal breath sounds.  Abdominal:     Palpations: Abdomen is soft.     Tenderness: There is no abdominal tenderness.  Skin:    General: Skin is warm and dry.  Neurological:     Mental Status: She is alert.     Sensory: No sensory deficit.     Motor: No weakness.     Comments: Oriented to self only  Psychiatric:        Mood and Affect: Mood normal.     ED Results / Procedures / Treatments   Labs (all labs ordered are listed, but only abnormal results are displayed) Labs Reviewed  RESP PANEL BY RT-PCR (RSV, FLU A&B, COVID)  [id number]  COMPREHENSIVE METABOLIC PANEL WITH GFR  CBC WITH DIFFERENTIAL/PLATELET  MAGNESIUM  D-DIMER, QUANTITATIVE  URINALYSIS, ROUTINE W REFLEX MICROSCOPIC  I-STAT CG4 LACTIC ACID, ED  TROPONIN I (HIGH SENSITIVITY)    EKG EKG Interpretation Date/Time:  Monday December 12 2023 17:36:14 EDT Ventricular Rate:  147 PR Interval:  136 QRS Duration:  74 QT Interval:  269 QTC Calculation: 421 R Axis:   -4  Text Interpretation: Sinus tachycardia Nonspecific T abnormalities, lateral leads Confirmed by Estelle June [phone]) on 12/12/2023 5:46:11 PM  Radiology No results found.  Procedures Procedures  {Document cardiac monitor, telemetry assessment procedure when appropriate:1}  Medications Ordered in ED Medications  morphine (PF) 2 MG/ML injection 2 mg (has no administration  in time range)    ED Course/ Medical Decision Making/ A&P   {   Click here for ABCD2, HEART and other calculatorsREFRESH Note before signing :1}                              Medical Decision Making 58 year old female with history as above returns to the emergency department given concerns for confused speech and pain in extremities.  No reported trauma.  Patient reporting pain in her left hip upon arrival.  Vital signs notable for sinus tachycardia in the 140s.  EMS was concern for potential SVT given the narrow complex tachycardia prior to arrival.  Heart rate did slow to the 140s here and it does appear to be sinus tach on the monitor.  No prior history of SVT or A-fib.  Hemodynamically stable.  Reported changes in her speech today.  Presentation concerning for dysrhythmia, PE giving history of metastatic cancer not on anticoagulation, progression of known brain metastases, pathologic fracture.  Regarding her rate, we will treat her pain first with small dose of morphine to see if that helps improve her heart rate and provide IV fluids.  Will continue to watch her on telemetry.  I spoke with her niece over the phone who was able to provide some additional information.  Amount and/or Complexity of Data Reviewed Labs: ordered. Radiology: ordered.  Risk Prescription drug management.   ***  {Document critical care time when appropriate:1} {Document review of labs and clinical decision tools ie heart score, Chads2Vasc2 etc:1}  {Document your independent review of radiology images, and any outside records:1} {Document your discussion with family members, caretakers, and with consultants:1} {Document social determinants of health affecting pt's care:1} {Document your decision making why or why not admission, treatments were needed:1} Final Clinical Impression(s) / ED Diagnoses Final diagnoses:  None    Rx / DC Orders ED Discharge Orders     None

## 2023-12-12 NOTE — Progress Notes (Signed)
 Pt's right arm restricted for IV access d/t extreme pain.

## 2023-12-12 NOTE — ED Notes (Signed)
 Lactic acid flagged at 3.65 results given to lisa g.rn by at

## 2023-12-12 NOTE — ED Triage Notes (Signed)
 SVT unknown LKW close to last night, pain in right arm

## 2023-12-13 ENCOUNTER — Encounter (HOSPITAL_COMMUNITY): Payer: Self-pay | Admitting: Internal Medicine

## 2023-12-13 ENCOUNTER — Other Ambulatory Visit: Payer: Self-pay

## 2023-12-13 DIAGNOSIS — C7931 Secondary malignant neoplasm of brain: Secondary | ICD-10-CM

## 2023-12-13 DIAGNOSIS — Z7189 Other specified counseling: Secondary | ICD-10-CM | POA: Diagnosis not present

## 2023-12-13 DIAGNOSIS — Z91030 Bee allergy status: Secondary | ICD-10-CM | POA: Diagnosis not present

## 2023-12-13 DIAGNOSIS — C3411 Malignant neoplasm of upper lobe, right bronchus or lung: Secondary | ICD-10-CM

## 2023-12-13 DIAGNOSIS — R4701 Aphasia: Secondary | ICD-10-CM | POA: Diagnosis present

## 2023-12-13 DIAGNOSIS — C7951 Secondary malignant neoplasm of bone: Secondary | ICD-10-CM | POA: Diagnosis present

## 2023-12-13 DIAGNOSIS — I10 Essential (primary) hypertension: Secondary | ICD-10-CM | POA: Diagnosis present

## 2023-12-13 DIAGNOSIS — G9341 Metabolic encephalopathy: Secondary | ICD-10-CM | POA: Diagnosis present

## 2023-12-13 DIAGNOSIS — D869 Sarcoidosis, unspecified: Secondary | ICD-10-CM | POA: Diagnosis present

## 2023-12-13 DIAGNOSIS — R4182 Altered mental status, unspecified: Secondary | ICD-10-CM | POA: Diagnosis present

## 2023-12-13 DIAGNOSIS — E871 Hypo-osmolality and hyponatremia: Secondary | ICD-10-CM | POA: Diagnosis present

## 2023-12-13 DIAGNOSIS — R6510 Systemic inflammatory response syndrome (SIRS) of non-infectious origin without acute organ dysfunction: Secondary | ICD-10-CM | POA: Diagnosis present

## 2023-12-13 DIAGNOSIS — Z8616 Personal history of COVID-19: Secondary | ICD-10-CM | POA: Diagnosis present

## 2023-12-13 DIAGNOSIS — Z85118 Personal history of other malignant neoplasm of bronchus and lung: Secondary | ICD-10-CM | POA: Diagnosis not present

## 2023-12-13 DIAGNOSIS — Z806 Family history of leukemia: Secondary | ICD-10-CM | POA: Diagnosis not present

## 2023-12-13 DIAGNOSIS — E876 Hypokalemia: Secondary | ICD-10-CM | POA: Diagnosis present

## 2023-12-13 DIAGNOSIS — Z87891 Personal history of nicotine dependence: Secondary | ICD-10-CM | POA: Diagnosis not present

## 2023-12-13 DIAGNOSIS — Z923 Personal history of irradiation: Secondary | ICD-10-CM | POA: Diagnosis not present

## 2023-12-13 DIAGNOSIS — Z681 Body mass index (BMI) 19 or less, adult: Secondary | ICD-10-CM | POA: Diagnosis not present

## 2023-12-13 DIAGNOSIS — J439 Emphysema, unspecified: Secondary | ICD-10-CM | POA: Diagnosis present

## 2023-12-13 DIAGNOSIS — G936 Cerebral edema: Secondary | ICD-10-CM | POA: Diagnosis not present

## 2023-12-13 DIAGNOSIS — R636 Underweight: Secondary | ICD-10-CM | POA: Diagnosis present

## 2023-12-13 DIAGNOSIS — U071 COVID-19: Secondary | ICD-10-CM | POA: Diagnosis present

## 2023-12-13 DIAGNOSIS — E872 Acidosis, unspecified: Secondary | ICD-10-CM | POA: Diagnosis present

## 2023-12-13 DIAGNOSIS — M84421D Pathological fracture, right humerus, subsequent encounter for fracture with routine healing: Secondary | ICD-10-CM | POA: Diagnosis present

## 2023-12-13 DIAGNOSIS — Z8249 Family history of ischemic heart disease and other diseases of the circulatory system: Secondary | ICD-10-CM | POA: Diagnosis not present

## 2023-12-13 DIAGNOSIS — Z515 Encounter for palliative care: Secondary | ICD-10-CM | POA: Diagnosis not present

## 2023-12-13 DIAGNOSIS — R569 Unspecified convulsions: Secondary | ICD-10-CM | POA: Diagnosis not present

## 2023-12-13 LAB — COMPREHENSIVE METABOLIC PANEL WITH GFR
ALT: 14 U/L (ref 0–44)
AST: 30 U/L (ref 15–41)
Albumin: 3.7 g/dL (ref 3.5–5.0)
Alkaline Phosphatase: 76 U/L (ref 38–126)
Anion gap: 16 — ABNORMAL HIGH (ref 5–15)
BUN: <5 mg/dL — ABNORMAL LOW (ref 6–20)
CO2: 22 mmol/L (ref 22–32)
Calcium: 9.1 mg/dL (ref 8.9–10.3)
Chloride: 97 mmol/L — ABNORMAL LOW (ref 98–111)
Creatinine, Ser: 0.72 mg/dL (ref 0.44–1.00)
GFR, Estimated: >60 mL/min (ref >60)
Glucose, Bld: 133 mg/dL — ABNORMAL HIGH (ref 70–99)
Potassium: 3.1 mmol/L — ABNORMAL LOW (ref 3.5–5.1)
Sodium: 135 mmol/L (ref 135–145)
Total Bilirubin: 1 mg/dL (ref 0.0–1.2)
Total Protein: 7.7 g/dL (ref 6.5–8.1)

## 2023-12-13 LAB — CBC WITH DIFFERENTIAL/PLATELET
Abs Immature Granulocytes: 0.06 10*3/uL (ref 0.00–0.07)
Basophils Absolute: 0.1 10*3/uL (ref 0.0–0.1)
Basophils Relative: 1 %
Eosinophils Absolute: 0 10*3/uL (ref 0.0–0.5)
Eosinophils Relative: 0 %
HCT: 42.5 % (ref 36.0–46.0)
Hemoglobin: 14 g/dL (ref 12.0–15.0)
Immature Granulocytes: 1 %
Lymphocytes Relative: 17 %
Lymphs Abs: 1.8 10*3/uL (ref 0.7–4.0)
MCH: 28.4 pg (ref 26.0–34.0)
MCHC: 32.9 g/dL (ref 30.0–36.0)
MCV: 86.2 fL (ref 80.0–100.0)
Monocytes Absolute: 0.6 10*3/uL (ref 0.1–1.0)
Monocytes Relative: 6 %
Neutro Abs: 8.2 10*3/uL — ABNORMAL HIGH (ref 1.7–7.7)
Neutrophils Relative %: 75 %
Platelets: 520 10*3/uL — ABNORMAL HIGH (ref 150–400)
RBC: 4.93 MIL/uL (ref 3.87–5.11)
RDW: 16.3 % — ABNORMAL HIGH (ref 11.5–15.5)
WBC: 10.8 10*3/uL — ABNORMAL HIGH (ref 4.0–10.5)
nRBC: 0 % (ref 0.0–0.2)

## 2023-12-13 LAB — MAGNESIUM: Magnesium: 1.7 mg/dL (ref 1.7–2.4)

## 2023-12-13 LAB — PHOSPHORUS: Phosphorus: 2.5 mg/dL (ref 2.5–4.6)

## 2023-12-13 MED ORDER — HEPARIN SODIUM (PORCINE) 5000 UNIT/ML IJ SOLN
5000.0000 [IU] | Freq: Three times a day (TID) | INTRAMUSCULAR | Status: DC
  Administered 2023-12-13 – 2023-12-15 (×5): 5000 [IU] via SUBCUTANEOUS
  Filled 2023-12-13 (×6): qty 1

## 2023-12-13 MED ORDER — MELATONIN 3 MG PO TABS
3.0000 mg | ORAL_TABLET | Freq: Every evening | ORAL | Status: DC | PRN
  Administered 2023-12-13: 3 mg via ORAL
  Filled 2023-12-13: qty 1

## 2023-12-13 MED ORDER — DEXAMETHASONE SODIUM PHOSPHATE 10 MG/ML IJ SOLN
10.0000 mg | Freq: Once | INTRAMUSCULAR | Status: AC
  Administered 2023-12-13: 10 mg via INTRAVENOUS
  Filled 2023-12-13: qty 1

## 2023-12-13 MED ORDER — SODIUM CHLORIDE 0.9% FLUSH
3.0000 mL | Freq: Two times a day (BID) | INTRAVENOUS | Status: DC
  Administered 2023-12-13 – 2023-12-15 (×5): 3 mL via INTRAVENOUS

## 2023-12-13 MED ORDER — ACETAMINOPHEN 325 MG PO TABS
650.0000 mg | ORAL_TABLET | Freq: Four times a day (QID) | ORAL | Status: DC | PRN
  Administered 2023-12-15: 650 mg via ORAL
  Filled 2023-12-13: qty 2

## 2023-12-13 MED ORDER — SODIUM CHLORIDE 0.9 % IV SOLN: INTRAVENOUS | Status: AC

## 2023-12-13 MED ORDER — NIRMATRELVIR/RITONAVIR (PAXLOVID)TABLET
3.0000 | ORAL_TABLET | Freq: Two times a day (BID) | ORAL | Status: DC
Start: 2023-12-13 — End: 2023-12-13

## 2023-12-13 MED ORDER — SODIUM CHLORIDE 0.9 % IV SOLN: INTRAVENOUS | Status: DC

## 2023-12-13 MED ORDER — POTASSIUM CHLORIDE 10 MEQ/100ML IV SOLN
10.0000 meq | INTRAVENOUS | Status: AC
  Administered 2023-12-13 (×3): 10 meq via INTRAVENOUS
  Filled 2023-12-13 (×4): qty 100

## 2023-12-13 MED ORDER — LEVALBUTEROL HCL 1.25 MG/0.5ML IN NEBU: 1.2500 mg | INHALATION_SOLUTION | RESPIRATORY_TRACT | Status: DC | PRN

## 2023-12-13 MED ORDER — ONDANSETRON HCL 4 MG/2ML IJ SOLN: 4.0000 mg | Freq: Four times a day (QID) | INTRAMUSCULAR | Status: DC | PRN

## 2023-12-13 MED ORDER — HYDROCODONE-ACETAMINOPHEN 5-325 MG PO TABS
1.0000 | ORAL_TABLET | ORAL | Status: DC | PRN
Start: 2023-12-13 — End: 2023-12-15
  Administered 2023-12-13 – 2023-12-14 (×4): 1 via ORAL
  Administered 2023-12-15: 2 via ORAL
  Administered 2023-12-15: 1 via ORAL
  Filled 2023-12-13 (×2): qty 1
  Filled 2023-12-13: qty 2
  Filled 2023-12-13 (×3): qty 1

## 2023-12-13 MED ORDER — DEXAMETHASONE 4 MG PO TABS
4.0000 mg | ORAL_TABLET | Freq: Four times a day (QID) | ORAL | Status: DC
  Administered 2023-12-13 – 2023-12-15 (×8): 4 mg via ORAL
  Filled 2023-12-13 (×8): qty 1

## 2023-12-13 MED ORDER — AMLODIPINE BESYLATE 5 MG PO TABS
5.0000 mg | ORAL_TABLET | Freq: Every day | ORAL | Status: DC
  Administered 2023-12-13 – 2023-12-14 (×2): 5 mg via ORAL
  Filled 2023-12-13 (×2): qty 1

## 2023-12-13 MED ORDER — ACETAMINOPHEN 650 MG RE SUPP: 650.0000 mg | Freq: Four times a day (QID) | RECTAL | Status: DC | PRN

## 2023-12-13 MED ORDER — DICLOFENAC SODIUM 1 % EX GEL
2.0000 g | Freq: Four times a day (QID) | CUTANEOUS | Status: DC
  Administered 2023-12-13 – 2023-12-15 (×5): 2 g via TOPICAL
  Filled 2023-12-13: qty 100

## 2023-12-13 MED ORDER — POTASSIUM CHLORIDE 10 MEQ/100ML IV SOLN
10.0000 meq | INTRAVENOUS | Status: AC
  Administered 2023-12-13: 10 meq via INTRAVENOUS
  Filled 2023-12-13: qty 100

## 2023-12-13 MED ORDER — GUAIFENESIN ER 600 MG PO TB12
600.0000 mg | ORAL_TABLET | Freq: Two times a day (BID) | ORAL | Status: DC
  Administered 2023-12-13 – 2023-12-14 (×4): 600 mg via ORAL
  Filled 2023-12-13 (×5): qty 1

## 2023-12-13 NOTE — ED Notes (Signed)
Pt placed on hosp bed.

## 2023-12-13 NOTE — H&P (Addendum)
 History and Physical    Patient: Regina [name]:[id number] DOB: Feb 02, 1966 DOA: 12/12/2023 DOS: the patient was seen and examined on 12/13/2023 PCP: [name], NP  Patient coming from: Home  Chief Complaint:  Chief Complaint  Patient presents with   Tachycardia   HPI: Regina [name] is a 58 y.o. 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 History obtained from patient, but limited due to difficulty in her speech.  Additional information obtained from her niece over the phone.  At baseline patient lives with her was reportedly found on the floor when he got home from work.  Normally patient has more clear speech is noticed that when she had difficulty speaking last time it was related to the brain tumor which were resected by [name] back in January.  Patient tried to clarifies that she did not fall; her brother laid her down because he could not lift her.  She has a chronic cough that has worsened recently. No nausea, vomiting, diarrhea, or wheezing.   She reports having left leg pain.  Her niece notes that the patient has been complaining of left leg pain for some time.  In the emergency department patient was noted to be afebrile with heart rates elevated up to 130, respiration 16-30, and all other vital signs maintained.  Labs from 4/7 significant for WBC 14.2, platelets 556, sodium 132, potassium 3.4, CO2 18, anion gap 17, high-sensitivity troponin 15 ->25, D-dimer was greater than 20, and lactic acid 3.7->2.3.  COVID-19 screening was positive.  Urinalysis did not show significant signs for infection.  X-rays chest noted no evidence of bone lesions and chronic lung disease with nodule at  the right lung apex.  X-rays of the pelvis showed known left sacral lesion is radiographic occult without pathological fracture.  CTA of the head and neck noted extensive vasogenic edema throughout the left frontal lobe consistent with known mass in that region with associated regional mass effect with 4 mm left-to-right shift and postoperative changes from prior left frontal craniotomy.  CTA of the chest noted no acute pulmonary embolism and emphysema with fibrosis and honeycombing at the lung bases with no acute disease appreciated, and right apical mass consistent with known malignancy with mediastinal and bilateral hilar adenopathy consistent with disease similar to prior with lytic lesions in mid right humerus.  Patient has been given 1 L of normal saline IV fluids, morphine IV for pain, and 2 g of magnesium sulfate.  Review of Systems: As mentioned in the history of present illness. All other systems reviewed and are negative. Past Medical History:  Diagnosis Date   Allergy    Anxiety    Brain mass    COPD (chronic obstructive pulmonary disease) (HCC)    Emphysema of lung (HCC)    Hypertension    Past Surgical History:  Procedure Laterality Date   APPLICATION OF CRANIAL NAVIGATION Left 10/07/2023   Procedure: APPLICATION OF CRANIAL NAVIGATION;  Surgeon: Coletta Memos, MD;  Location: [facility] OR;  Service: Neurosurgery;  Laterality: Left;   CRANIOTOMY Left 10/07/2023   Procedure: FRONTAL CRANIOTOMY FOR ABSCESS;  Surgeon: Coletta Memos, MD;  Location: [facility];  Service: Neurosurgery;  Laterality: Left;   dental procedure     HUMERUS IM NAIL Right 10/10/2023   Procedure:  INTRAMEDULLARY (IM) NAIL HUMERAL RIGHT;  Surgeon: Huel Cote, MD;  Location: [facility] OR;  Service: Orthopedics;  Laterality: Right;   Social History:  reports that she quit smoking about 4 years ago. Her smoking use included cigarettes. She has never used smokeless tobacco. She reports current alcohol use of about 6.0 standard drinks of  alcohol per week. She reports current drug use. Frequency: 4.00 times per week. Drug: Marijuana.  Allergies  Allergen Reactions   Pork Allergy Nausea And Vomiting   Bee Venom Hives    Family History  Problem Relation Age of Onset   Leukemia Mother    Hypertension Mother    Hypertension Father    Cancer Neg Hx    Diabetes Neg Hx    Heart disease Neg Hx    Colon cancer Neg Hx    Esophageal cancer Neg Hx    Rectal cancer Neg Hx    Stomach cancer Neg Hx     Prior to Admission medications   Medication Sig Start Date End Date Taking? Authorizing Provider  amLODipine (NORVASC) 5 MG tablet Take 1 tablet (5 mg total) by mouth daily. 11/01/23  Yes [name], NP  cyclobenzaprine (FLEXERIL) 10 MG tablet Take 1 tablet (10 mg total) by mouth at bedtime for 10 days. 12/10/23 12/20/23 Yes Smitty Knudsen, PA-C  diclofenac Sodium (VOLTAREN) 1 % GEL Apply 2 g topically 4 (four) times daily. 12/10/23   Smitty Knudsen, PA-C    Physical Exam: Vitals:   12/12/23 2100 12/12/23 2115 12/13/23 0200 12/13/23 0400  BP: 133/75 135/84 137/79 135/79  Pulse: 94 (!) 103 85 86  Resp:  (!) 22 18 16   Temp:  98.4 F (36.9 C) 98.6 F (37 C) 98.3 F (36.8 C)  TempSrc:  Oral Oral   SpO2: 98% 96% 98% 98%    Constitutional: Frail chronically ill-appearing elderly female in no acute distress at this time Eyes: PERRL, lids and conjunctivae normal ENMT: Mucous membranes are moist.  Poor dentition .  Neck: normal, supple, no masses, no thyromegaly Respiratory: Tachypneic with coarse breath sounds.  O2 saturation currently maintained on room air. Cardiovascular: Regular rate and rhythm, no murmurs / rubs / gallops. No extremity edema. 2+ pedal pulses.  Abdomen: no tenderness, no masses palpated. Bowel sounds positive.  Musculoskeletal: no clubbing / cyanosis.  Decreased range of motion of the right arm.  No focal deformity noted to left leg. Skin: no rashes, lesions, ulcers. No induration Neurologic: CN  2-12 grossly intact.  Able to move all extremities.  Expressive aphasia  No facial droop appreciated. Psychiatric: Alert and oriented to person and place.     Data Reviewed:  EKG reveals sinus tachycardia at 147 bpm.  Repeat labs this morning noted WBC 10.8, platelets 520, potassium 3.1, anion gap 16.  Reviewed labs, imaging, and pertinent records as documented. 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  CTA  of the head noted extensive vasogenic edema throughout the left frontal lobe consistent with known mass in that region with associated regional mass effect with 4 mm left-to-right shift.  Unclear if symptoms are 2/2 vasogenic edema and/or Covid-19 infection. Discussed with neurosurgery who recommended starting Decadron -Admit to a progressive bed -Neurochecks -Decadron 10 mg IV x 1 dose, then decadron 4 mg po - PT/OT/speech to evaluate and treat  SIRS Lactic acidosis Acute.  Patient was noted to be tachycardic and tachypneic with leukocytosis  of 14.2 meeting SIRS criteria.  Lactic acid levels noted to be elevated initially at 3.7 but trended down to 2.3 after IV fluids.  Urinalysis did not show any clear signs of infection and imaging did not show any signs for infection. -Check blood culture -Check procalcitonin -Continue to trend lactic acid levels  COVID-19 infection Acute.  Patient presents with complaints of chronic cough that may be slightly worse.  On physical exam no significant wheezing appreciated. -COVID-19 ORDER set utilized -Airborne precautions -Incentive spirometry flutter valve -Patient not a good candidate for Paxlovid -Mucinex  Hypokalemia Acute.  Potassium noted to be 3.1. 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  Subsequent pathological fracture of the right humerus requiring nailing in 10/2023. PET scan preformed on 10-27-23 showed an intensely hypermetabolic lobulated solid 2.6 cm peripheral apical right upper lobe pulmonary nodule, a hypermetabolic large expansile lytic destructive right mid humerus shaft 7 cm bone metastasis. Scan also indicted a hypermetabolic medial lower left sacral bone metastasis and a low level hypermetabolism associated with faintly calcified mediastinal and bilateral hilar lymph nodes, favoring sequela of prior granulomatous disease, likely sarcoidosis.  Unclear if  left sacral bone metastasis is the cause for patient's symptoms, leg pain -Hydrocodone prn pain -[name] added to treatment team -Palliative care consulted.  Metabolic acidosis with increased anion gap Acute.  Initial CO2 18 with anion gap of 17.  Repeat labs showing improvement. Thought 2/2 lactic acidosis. - Recheck labs in a.m.   DVT prophylaxis: Heparin Advance Care Planning:   Code Status: Full Code   Consults: Neurosurgery  Family Communication: Patient's niece updated over the phone  Severity of Illness: The appropriate patient status for this patient is INPATIENT. Inpatient status is judged to be reasonable and necessary in order to provide the required intensity of service to ensure the patient's safety. The patient's presenting symptoms, physical exam findings, and initial radiographic and laboratory data in the context of their chronic comorbidities is felt to place them at high risk for further clinical deterioration. Furthermore, it is not anticipated that the patient will be medically stable for discharge from the hospital within 2 midnights of admission.   * I certify that at the point of admission it is my clinical judgment that the patient will require  inpatient hospital care spanning beyond 2 midnights from the point of admission due to high intensity of service, high risk for further deterioration and high frequency of surveillance required.*  Author: Clydie Braun, MD 12/13/2023 7:12 AM  For on call review [email or web address].

## 2023-12-13 NOTE — Progress Notes (Signed)
 [facility] admission to the Day Admitter.  I discussed this case with the EDP, Dr. Casimiro Needle 58 year old female with history of [place].  Per these discussions:   This is a metastatic lung cancer to the bone and brain, known pathologic right humerus fracture, who is being admitted for acute metabolic encephalopathy as well as COVID-19 infection.  In the setting of her known history of metastatic lung cancer with mets to bone and brain, she had undergone a stereotactic craniotomy in February 2025  Today, her son found the patient laying on the ground of her house, altered, confused, conveying that this is a significant [place] from her baseline mental status.  This prompted the patient to be brought to [facility] emergency department via EMS.   Upon arrival in the ED, vital signs are notable for sinus tachycardia with heart rates in the 140s, which is subsequent proved in the low 100s with IV fluids.  Oxygen saturations within the range of 96 to 90% on room air.  She is found to be COVID-19 positive, although does not sound like she has been experiencing any acute respiratory symptoms.  Additionally, her magnesium was low at 1.4, prompting magnesium sulfate supplementation ordered by EDP.  CT of the head showed no evidence of acute intracranial process relative demonstration prior imaging of the brain.  CT a chest with PE protocol is reported to show no evidence of acute cardiopulmonary process, Cleen evidence of acute pulmonary embolism or any evidence of infiltrate.  I have placed an order for inpatient admission for further evaluation management of acute encephalopathy, as above.  I have placed some additional preliminary admit orders via the adult multi-morbid admission order set. I have also ordered morning labs in the form of CMP, CBC, magnesium level.  As her sinus tachycardia improved with IV fluids, have ordered some additional gentle overnight IV fluids in the form of normal saline at 75 cc/h  x 12 hours.  In the absence of any acute cardiopulmonary process on CTA chest and given the patient's oxygen saturations in the range of 96 to 90% on room air, but did not start her on a systemic corticosteroids as a component of her management for COVID.    Newton Pigg, DO Hospitalist

## 2023-12-14 ENCOUNTER — Inpatient Hospital Stay (HOSPITAL_COMMUNITY): Payer: MEDICAID

## 2023-12-14 ENCOUNTER — Encounter (HOSPITAL_COMMUNITY): Payer: Self-pay

## 2023-12-14 DIAGNOSIS — G936 Cerebral edema: Secondary | ICD-10-CM

## 2023-12-14 DIAGNOSIS — C7951 Secondary malignant neoplasm of bone: Secondary | ICD-10-CM | POA: Diagnosis not present

## 2023-12-14 DIAGNOSIS — G9341 Metabolic encephalopathy: Secondary | ICD-10-CM

## 2023-12-14 DIAGNOSIS — R4701 Aphasia: Secondary | ICD-10-CM | POA: Diagnosis not present

## 2023-12-14 DIAGNOSIS — R4182 Altered mental status, unspecified: Secondary | ICD-10-CM

## 2023-12-14 DIAGNOSIS — R569 Unspecified convulsions: Secondary | ICD-10-CM

## 2023-12-14 DIAGNOSIS — Z7189 Other specified counseling: Secondary | ICD-10-CM

## 2023-12-14 DIAGNOSIS — Z515 Encounter for palliative care: Secondary | ICD-10-CM

## 2023-12-14 LAB — CBC
HCT: 40.2 % (ref 36.0–46.0)
Hemoglobin: 13.8 g/dL (ref 12.0–15.0)
MCH: 28.6 pg (ref 26.0–34.0)
MCHC: 34.3 g/dL (ref 30.0–36.0)
MCV: 83.4 fL (ref 80.0–100.0)
Platelets: 375 10*3/uL (ref 150–400)
RBC: 4.82 MIL/uL (ref 3.87–5.11)
RDW: 16 % — ABNORMAL HIGH (ref 11.5–15.5)
WBC: 8.1 10*3/uL (ref 4.0–10.5)
nRBC: 0 % (ref 0.0–0.2)

## 2023-12-14 LAB — BASIC METABOLIC PANEL WITH GFR
Anion gap: 14 (ref 5–15)
BUN: <5 mg/dL — ABNORMAL LOW (ref 6–20)
CO2: 20 mmol/L — ABNORMAL LOW (ref 22–32)
Calcium: 8.6 mg/dL — ABNORMAL LOW (ref 8.9–10.3)
Chloride: 98 mmol/L (ref 98–111)
Creatinine, Ser: 0.49 mg/dL (ref 0.44–1.00)
GFR, Estimated: >60 mL/min (ref >60)
Glucose, Bld: 138 mg/dL — ABNORMAL HIGH (ref 70–99)
Potassium: 4.9 mmol/L (ref 3.5–5.1)
Sodium: 132 mmol/L — ABNORMAL LOW (ref 135–145)

## 2023-12-14 MED ORDER — GADOBUTROL 1 MMOL/ML IV SOLN
5.5000 mL | Freq: Once | INTRAVENOUS | Status: AC | PRN
  Administered 2023-12-14: 5.5 mL via INTRAVENOUS

## 2023-12-14 NOTE — Progress Notes (Signed)
 PROGRESS NOTE        PATIENT DETAILS Name: Regina [name] Age: 58 y.o. Sex: female Date of Birth: 12/15/1965 Admit Date: 12/12/2023 Admitting Physician Clydie Braun, MD ZOX:[id number], Kinnie Scales, NP  Brief Summary: Patient is a 58 y.o.  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  Significant events: 4/7>> confusion/expressive aphasia-admit to [facility].  Significant studies: 4/7>> CT head: Extensive vasogenic edema throughout left frontal lobe consistent with mass in this region. 4/7>> CT angio head/neck: No LVO or significant stenosis. 4/7 >>CT angio chest: Negative for PE-emphysema/fibrosis/honeycombing at lung bases.  Redemonstrated right apical lung mass.  Mediastinal/bilateral hilar lymphadenopathy.  Lytic lesion in the mid humerus.  Significant microbiology data: 4/7>> COVID PCR: Positive 4/7>> influenza/RSV PCR: Negative 4/8>> blood culture: No growth  Procedures: None  Consults: None  Subjective: Awake-some expressive aphasia-needs gentle redirection-but speech is mostly fluent.  Objective: Vitals: Blood pressure (!) 143/88, pulse 91, temperature 98.2 F (36.8 C), temperature source Oral, resp. rate (!) 27, height 5\' 6"  (1.676 m), weight 55.8 kg, last menstrual period 06/06/2010, SpO2 98%.   Exam: Gen Exam:Alert awake-not in any distress HEENT:atraumatic, normocephalic Chest: B/L clear to auscultation anteriorly CVS:S1S2 regular Abdomen:soft non tender, non distended Extremities:no edema Neurology: Difficulty in moving right arm but moving forearm/hands-able to make a fist. Skin: no  rash  Pertinent Labs/Radiology:    Latest Ref Rng & Units 12/14/2023    4:28 AM 12/13/2023    5:23 AM 12/12/2023    5:31 PM  CBC  WBC 4.0 - 10.5 K/uL 8.1  10.8  14.2   Hemoglobin 12.0 - 15.0 g/dL 09.8  11.9  14.7   Hematocrit 36.0 - 46.0 % 40.2  42.5  42.2   Platelets 150 - 400 K/uL 375  520  556     Lab Results  Component Value Date   NA 132 (L) 12/14/2023   K 4.9 12/14/2023   CL 98 12/14/2023   CO2 20 (L) 12/14/2023      Assessment/Plan: Acute encephalopathy/expressive aphasia Suspect from extensive vasogenic edema surrounding known frontal lobe mass-remarkable improvement overnight following initiation of steroids Continue Decadron Await MRI brain/EEG PT/OT/SLP eval  COVID-19 infection Suspect incidental finding Asymptomatic-without any cough/URI symptoms-doubt any treatment required.  SIRS Likely noninfectious All cultures negative  Hyponatremia Mild Follow  Hypokalemia Repleted-follow  HTN BP stable Amlodipine.  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 Will need outpatient follow-up with oncology  Underweight: Estimated body mass index is 19.86 kg/m as calculated from the following:   Height as of this encounter: 5\' 6"  (1.676 m).   Weight as of this encounter: 55.8 kg.   Code status:   Code Status: Full Code   DVT Prophylaxis: heparin injection 5,000 Units Start: 12/13/23 0745 SCDs Start: 12/13/23 0728 SCDs Start: 12/13/23 0019    Family Communication: None at bedside   Disposition Plan: Status is: Inpatien Remains inpatient appropriate because: Severity of illness   Planned Discharge Destination:Home health   Diet: Diet Order  Diet regular Room service appropriate? Yes; Fluid consistency: Thin  Diet effective now                     Antimicrobial agents: Anti-infectives (From admission,  onward)    Start     Dose/Rate Route Frequency Ordered Stop   12/13/23 1000  nirmatrelvir/ritonavir (PAXLOVID) 3 tablet  Status:  Discontinued        3 tablet Oral 2 times daily 12/13/23 0736 12/13/23 0744        MEDICATIONS: Scheduled Meds:  amLODipine  5 mg Oral Daily   dexamethasone  4 mg Oral Q6H   diclofenac Sodium  2 g Topical QID   guaiFENesin  600 mg Oral BID   heparin  5,000 Units Subcutaneous Q8H   sodium chloride flush  3 mL Intravenous Q12H   Continuous Infusions: PRN Meds:.acetaminophen **OR** acetaminophen, HYDROcodone-acetaminophen, levalbuterol, melatonin, ondansetron (ZOFRAN) IV   I have personally reviewed following labs and imaging studies  LABORATORY DATA: CBC: Recent Labs  Lab 12/10/23 1724 12/12/23 1731 12/13/23 0523 12/14/23 0428  WBC 9.6 14.2* 10.8* 8.1  NEUTROABS 5.3 11.4* 8.2*  --   HGB 13.8 14.0 14.0 13.8  HCT 42.0 42.2 42.5 40.2  MCV 85.9 84.7 86.2 83.4  PLT 704* 556* 520* 375    Basic Metabolic Panel: Recent Labs  Lab 12/10/23 1724 12/12/23 1731 12/13/23 0523 12/14/23 0428  NA 130* 132* 135 132*  K 3.7 3.4* 3.1* 4.9  CL 95* 97* 97* 98  CO2 17* 18* 22 20*  GLUCOSE 68* 141* 133* 138*  BUN 6 <5* <5* <5*  CREATININE 0.73 0.90 0.72 0.49  CALCIUM 9.4 10.1 9.1 8.6*  MG  --  1.4* 1.7  --   PHOS  --   --  2.5  --     GFR: Estimated Creatinine Clearance: 65.9 mL/min (by C-G formula based on SCr of 0.49 mg/dL).  Liver Function Tests: Recent Labs  Lab 12/10/23 1724 12/12/23 1731 12/13/23 0523  AST 30 33 30  ALT 12 14 14   ALKPHOS 87 74 76  BILITOT 1.3* 1.2 1.0  PROT 8.2* 7.7 7.7  ALBUMIN 4.0 3.7 3.7   No results for input(s): "LIPASE", "AMYLASE" in the last 168 hours. No results for input(s): "AMMONIA" in the last 168 hours.  Coagulation Profile: No results for input(s): "INR", "PROTIME" in the last 168 hours.  Cardiac Enzymes: No results for input(s): "CKTOTAL", "CKMB", "CKMBINDEX", "TROPONINI" in the last 168  hours.  BNP (last 3 results) No results for input(s): "PROBNP" in the last 8760 hours.  Lipid Profile: No results for input(s): "CHOL", "HDL", "LDLCALC", "TRIG", "CHOLHDL", "LDLDIRECT" in the last 72 hours.  Thyroid Function Tests: No results for input(s): "TSH", "T4TOTAL", "FREET4", "T3FREE", "THYROIDAB" in the last 72 hours.  Anemia Panel: No results for input(s): "VITAMINB12", "FOLATE", "FERRITIN", "TIBC", "IRON", "RETICCTPCT" in the last 72 hours.  Urine analysis:    Component Value Date/Time   COLORURINE STRAW (A) 12/12/2023 1824   APPEARANCEUR CLEAR 12/12/2023 1824   LABSPEC 1.004 (L) 12/12/2023 1824   PHURINE 6.0 12/12/2023 1824   GLUCOSEU NEGATIVE 12/12/2023 1824   HGBUR NEGATIVE 12/12/2023 1824   BILIRUBINUR NEGATIVE 12/12/2023 1824   KETONESUR 5 (A) 12/12/2023 1824   PROTEINUR NEGATIVE 12/12/2023 1824   NITRITE NEGATIVE 12/12/2023 1824   LEUKOCYTESUR NEGATIVE 12/12/2023 1824    Sepsis Labs: Lactic Acid, Venous    Component Value Date/Time   LATICACIDVEN 2.3 (HH) 12/12/2023 2113    MICROBIOLOGY: Recent Results (  from the past 240 hours)  Resp panel by RT-PCR (RSV, Flu A&B, Covid) Urine, Catheterized     Status: Abnormal   Collection Time: 12/12/23  5:52 PM   Specimen: Urine, Catheterized; Nasal Swab  Result Value Ref Range Status   SARS Coronavirus 2 by RT PCR POSITIVE (A) NEGATIVE Final   Influenza A by PCR NEGATIVE NEGATIVE Final   Influenza B by PCR NEGATIVE NEGATIVE Final    Comment: (NOTE) The Xpert Xpress SARS-CoV-2/FLU/RSV plus assay is intended as an aid in the diagnosis of influenza from Nasopharyngeal swab specimens and should not be used as a sole basis for treatment. Nasal washings and aspirates are unacceptable for Xpert Xpress SARS-CoV-2/FLU/RSV testing.  Fact Sheet for Patients: BloggerCourse.com  Fact Sheet for Healthcare Providers: SeriousBroker.it  This test is not yet approved or  cleared by the [place] FDA and has been authorized for detection and/or diagnosis of SARS-CoV-2 by FDA under an Emergency Use Authorization (EUA). This EUA will remain in effect (meaning this test can be used) for the duration of the COVID-19 declaration under Section 564(b)(1) of the Act, 21 U.S.C. section 360bbb-3(b)(1), unless the authorization is terminated or revoked.     Resp Syncytial Virus by PCR NEGATIVE NEGATIVE Final    Comment: (NOTE) Fact Sheet for Patients: BloggerCourse.com  Fact Sheet for Healthcare Providers: SeriousBroker.it  This test is not yet approved or cleared by the [place] FDA and has been authorized for detection and/or diagnosis of SARS-CoV-2 by FDA under an Emergency Use Authorization (EUA). This EUA will remain in effect (meaning this test can be used) for the duration of the COVID-19 declaration under Section 564(b)(1) of the Act, 21 U.S.C. section 360bbb-3(b)(1), unless the authorization is terminated or revoked.  Performed at [facility], [address], [place], [place] [address]   Culture, blood (single) w Reflex to ID Panel     Status: None (Preliminary result)   Collection Time: 12/13/23  8:00 AM   Specimen: BLOOD LEFT HAND  Result Value Ref Range Status   Specimen Description BLOOD LEFT HAND  Final   Special Requests   Final    BOTTLES DRAWN AEROBIC AND ANAEROBIC Blood Culture results may not be optimal due to an inadequate volume of blood received in culture bottles   Culture   Final    NO GROWTH < 24 HOURS Performed at [facility], [address] [place], [place] [address]    Report Status PENDING  Incomplete    RADIOLOGY STUDIES/RESULTS: CT Angio Chest PE W and/or Wo Contrast Result Date: 12/12/2023 CLINICAL DATA:  SVT positive D-dimer history of lung cancer EXAM: CT ANGIOGRAPHY CHEST WITH CONTRAST TECHNIQUE: Multidetector CT imaging of the chest was  performed using the standard protocol during bolus administration of intravenous contrast. Multiplanar CT image reconstructions and MIPs were obtained to evaluate the vascular anatomy. RADIATION DOSE REDUCTION: This exam was performed according to the departmental dose-optimization program which includes automated exposure control, adjustment of the mA and/or kV according to patient size and/or use of iterative reconstruction technique. CONTRAST:  OMNIPAQUE IOHEXOL 350 MG/ML SOLN COMPARISON:  PET CT 10/27/2023, CT 09/17/2023 FINDINGS: Cardiovascular: Satisfactory opacification of the pulmonary arteries to the segmental level. No evidence of pulmonary embolism. Mild atherosclerosis. No aneurysm. Normal cardiac size. No pericardial effusion Mediastinum/Nodes: Patent trachea. No thyroid mass. Right supraclavicular nodes measuring up to 7 mm. Redemonstrated multiple enlarged paratracheal, bilateral hilar and subcarinal adenopathy consistent with metastatic disease. Index right paratracheal node  measures about 15 mm on series 6, image 51, compared with 14 mm previously. Right hilar nodes measuring up to 14 mm on series 6, image 72, previously 14 mm. Left hilar nodes measuring up to 14 mm on series 6, image 80, previously 13 mm. Esophagus within normal limits. Lungs/Pleura: Emphysema. Honeycombing and fibrosis at the lung bases. No acute airspace disease. Pleuroparenchymal scarring at the apices. Lobulated right apical lung mass measuring 2.7 x 1.9 cm on series 8, image 19, previously 2.9 by 1.5 cm. Upper Abdomen: No acute abnormality. Musculoskeletal: No chest wall abnormality. No acute or significant osseous findings. Lytic lesion in the mid right humerus on scout image. Review of the MIP images confirms the above findings. IMPRESSION: 1. Negative for acute pulmonary embolus. 2. Emphysema. Fibrosis and honeycombing at the lung bases. No acute airspace disease. 3. Redemonstrated right apical lung mass consistent  with known malignancy. Mediastinal and bilateral hilar adenopathy consistent with metastatic disease, similar to prior. Lytic lesion in the mid right humerus on scout image consistent with metastatic disease. Aortic Atherosclerosis (ICD10-I70.0) and Emphysema (ICD10-J43.9). Electronically Signed   By: Jasmine Pang M.D.   On: 12/12/2023 23:40   CT ANGIO HEAD NECK W WO CM Result Date: 12/12/2023 CLINICAL DATA:  Initial evaluation for acute speech changes. EXAM: CT ANGIOGRAPHY HEAD AND NECK WITH AND WITHOUT CONTRAST TECHNIQUE: Multidetector CT imaging of the head and neck was performed using the standard protocol during bolus administration of intravenous contrast. Multiplanar CT image reconstructions and MIPs were obtained to evaluate the vascular anatomy. Carotid stenosis measurements (when applicable) are obtained utilizing NASCET criteria, using the distal internal carotid diameter as the denominator. RADIATION DOSE REDUCTION: This exam was performed according to the departmental dose-optimization program which includes automated exposure control, adjustment of the mA and/or kV according to patient size and/or use of iterative reconstruction technique. CONTRAST:  OMNIPAQUE IOHEXOL 350 MG/ML SOLN COMPARISON:  Prior study from 10/10/2023 and earlier. FINDINGS: CT HEAD FINDINGS Brain: Persistent vasogenic edema throughout the left frontal lobe, consistent with no in mass lesion in this region. The mass itself is not well visualized on this noncontrast examination. Partial effacement of the left lateral ventricle with 4 mm of left-to-right shift. No other mass lesion or mass effect. No acute large vessel territory infarct. No hydrocephalus. Postoperative changes from prior left frontal craniotomy. Small residual postoperative collection subjacent to the craniotomy bone flap measures up to 4 mm in maximal thickness (series 9, image 27). Vascular: No abnormal hyperdense vessel. Skull: Post craniotomy changes on  the left. Soft tissue swelling present at the right occipital scalp. Calvarium intact. Sinuses/Orbits: Globes and orbital soft tissues within normal limits. Mucosal thickening with air-fluid level noted within the left maxillary sinus. Paranasal sinuses are otherwise clear. No mastoid effusion. Other: None. Review of the MIP images confirms the above findings CTA NECK FINDINGS Aortic arch: Examination degraded by motion artifact. Visualized aortic arch within normal limits for caliber with standard 3 vessel morphology. No significant stenosis about the origin the great vessels. Right carotid system: Right common and internal carotid arteries are patent within the neck. No visible dissection. Atheromatous plaque about the right carotid bulb without hemodynamically significant greater than 50% stenosis. Left carotid system: Left common and internal carotid arteries are patent within the neck. No visible dissection. No hemodynamically significant stenosis about the left carotid artery system. Vertebral arteries: Both vertebral arteries arise from the subclavian arteries. Left vertebral artery dominant. Vertebral arteries are patent without visible dissection or stenosis.  Skeleton: No worrisome osseous lesions. Other neck: No other acute abnormality within the neck. Upper chest: Emphysema. Irregular biapical pleuroparenchymal pleural based right upper lobe mass, consistent with patient's known lung cancer. Scattered enlarged mediastinal, hilar, and right axillary lymph nodes noted, compatible with nodal metastatic disease. Findings better evaluated on concomitant CT of the chest. Review of the MIP images confirms the above findings CTA HEAD FINDINGS Anterior circulation: Both internal carotid arteries widely patent to the termini without stenosis. A1 segments widely patent. Normal anterior communicating artery complex. Both anterior cerebral arteries widely patent to their distal aspects without stenosis. No M1 stenosis  or occlusion. Normal MCA bifurcations. Distal MCA branches well perfused and symmetric. Posterior circulation: Left V4 segment dominant and widely patent to the vertebrobasilar junction. Right vertebral artery terminates in PICA. Both PICA patent. Basilar patent without stenosis. Superior cerebellar and posterior cerebral arteries patent bilaterally. Venous sinuses: Patent allowing for timing the contrast bolus. Anatomic variants: As above.  No aneurysm. Review of the MIP images confirms the above findings IMPRESSION: CT HEAD: 1. Extensive vasogenic edema throughout the left frontal lobe, consistent with known mass in this region. The mass itself is not well visualized on this noncontrast examination. Associated regional mass effect with 4 mm of left-to-right shift. 2. Postoperative changes from prior left frontal craniotomy with small residual postoperative collection subjacent to the craniotomy bone flap as above. 3. No other acute intracranial abnormality. CTA HEAD AND NECK: 1. Negative CTA of the head and neck. No large vessel occlusion or other emergent finding. 2. Atheromatous plaque about the right carotid bulb without hemodynamically significant stenosis. 3. Irregular pleural based right upper lobe mass, consistent with patient's known lung cancer. Scattered enlarged mediastinal, hilar, and right axillary lymph nodes, compatible with nodal metastatic disease. Findings better evaluated on concomitant CT of the chest. 4. Aortic Atherosclerosis (ICD10-I70.0) and Emphysema (ICD10-J43.9). Electronically Signed   By: Rise Mu M.D.   On: 12/12/2023 23:28   DG Pelvis Portable Result Date: 12/12/2023 CLINICAL DATA:  Lung cancer with metastasis to bone.  Left hip pain. EXAM: PORTABLE PELVIS 1-2 VIEWS COMPARISON:  PET CT 10/27/2023 FINDINGS: Patient's known left sacral lesion is radiographically occult, also CT occult on prior PET. No evidence of acute fracture. Both hips are normally located. Pubic  symphysis and sacroiliac joints are congruent. IMPRESSION: 1. Patient's known left sacral lesion is radiographically occult. 2. No evidence of pathologic fracture. Electronically Signed   By: Narda Rutherford M.D.   On: 12/12/2023 22:06   DG Chest Portable 1 View Result Date: 12/12/2023 CLINICAL DATA:  History of lung cancer with Mets to bone. Left hip pain. EXAM: PORTABLE CHEST 1 VIEW COMPARISON:  Chest radiograph 10/01/2023. CT 09/17/2023, PET 10/27/2023 FINDINGS: Stable heart size and mediastinal contours. Aortic atherosclerosis. Chronic lung disease with subpleural reticulation, peripheral and basilar predominant. Nodular airspace disease in the right lung apex is faintly visualized by radiograph. No evidence of focal bone lesion by radiograph. IMPRESSION: 1. No radiographic evidence of bone lesion. 2. Chronic lung disease. Nodule at the right lung apex is faintly visualized by radiograph. Electronically Signed   By: Narda Rutherford M.D.   On: 12/12/2023 22:05     LOS: 1 day   Jeoffrey Massed, MD  [facility]    To contact the attending provider between 7A-7P or the covering provider during after hours 7P-7A, please log into the web site [email or web address] and access using universal [place] password for that web site. If you do not have the  password, please call the hospital operator.  12/14/2023, 10:16 AM

## 2023-12-14 NOTE — Evaluation (Signed)
 Physical Therapy Evaluation Patient Details Name: Bobbe Quilter MRN: [id number] DOB: 01-20-1966 Today's Date: 12/14/2023  History of Present Illness  Pt is a 58 y/o F admitted on 12/12/23 after presenting with confusion. Pt tested positive for COVID-19. Pt is being treated for acute encephalopathy. Head CTA showed extensive L frontal lobe vasogenic edema consistent with known mass in that region with associated regional mass effect with 4 mm left-to-right shift. 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 Unable to determine current level of cognition 2/2 communication deficits. Pt is pleasant & motivated to participate, reports she is staying in the same home with her brother & son, has not returned to work yet, denies any other falls, & still drives. On this date, pt is able to ambulate multiple laps in room without AD without LOB independently. Pt performs 2 sets x 5x STS without BUE support without issue. At this time, pt appears to be at baseline level of function & does not require acute PT services. Pt can continue to mobilize with mobility specialists while admitted. PT to complete current orders, please re-consult if new needs arise.      If plan is discharge home, recommend the following: Assist for transportation   Can travel by private vehicle        Equipment Recommendations None recommended by PT  Recommendations for Other Services       Functional Status Assessment Patient has not had a recent decline in their functional status     Precautions / Restrictions Precautions Precautions: None Precaution/Restrictions Comments: pt with recent hx of RUE fx, has sleeve on R forearm (states ortho MD gave it to  her) Restrictions Weight Bearing Restrictions Per Provider Order: No      Mobility  Bed Mobility Overal bed mobility: Modified Independent Bed Mobility: Supine to Sit     Supine to sit: Modified independent (Device/Increase time), HOB elevated          Transfers Overall transfer level: Needs assistance Equipment used: None Transfers: Sit to/from Stand Sit to Stand: Modified independent (Device/Increase time)           General transfer comment: STS from EOB with LUE, without BUE support    Ambulation/Gait Ambulation/Gait assistance: Modified independent (Device/Increase time) Gait Distance (Feet):  (5 laps to door & back of room ~100 ft) Assistive device: None Gait Pattern/deviations: Step-through pattern Gait velocity: [facility]        Stairs            Wheelchair Mobility     Tilt Bed    Modified Rankin (Stroke Patients Only)       Balance Overall balance assessment: Needs assistance   Sitting balance-Leahy Scale: Good     Standing balance support: No upper extremity supported, During functional activity Standing balance-Leahy Scale: Good                               Pertinent Vitals/Pain Pain Assessment Pain Assessment: No/denies pain    Home Living Family/patient expects to be discharged to:: Private residence Living Arrangements: Other relatives (brother & son) Available Help at Discharge: Family;Available PRN/intermittently Type of Home: Apartment Home Access: Level entry  Home Layout: One level Home Equipment: None      Prior Function Prior Level of Function : Independent/Modified Independent;Driving             Mobility Comments: Ambulatory without AD, denies any other falls ADLs Comments: Per chart, pt works at [employer], pt reports she has not returned to work yet     Extremity/Trunk Assessment   Upper Extremity Assessment Upper Extremity Assessment: Defer to OT evaluation;Right hand dominant RUE  Deficits / Details: decreased use of RUE, pt with recent hx of humeral fx    Lower Extremity Assessment Lower Extremity Assessment: Overall WFL for tasks assessed       Communication   Communication Communication: Impaired Factors Affecting Communication: Reduced clarity of speech    Cognition Arousal: Alert Behavior During Therapy: WFL for tasks assessed/performed   PT - Cognitive impairments: Difficult to assess Difficult to assess due to: Impaired communication                     PT - Cognition Comments: AxOx self, year, place, reports month is May, unsure of exactly what happened prior to admission, reports she remembers things that happened after she woke up but unsure of what happened to cause her to be on the floor Following commands: Intact       Cueing Cueing Techniques: Verbal cues     General Comments General comments (skin integrity, edema, etc.): 5x STS from EOB without BUE support x 2 sets, no assistance required    Exercises     Assessment/Plan    PT Assessment Patient does not need any further PT services  PT Problem List         PT Treatment Interventions      PT Goals (Current goals can be found in the Care Plan section)  Acute Rehab PT Goals Patient Stated Goal: none stated PT Goal Formulation: With patient Time For Goal Achievement: 12/28/23 Potential to Achieve Goals: Good    Frequency       Co-evaluation               AM-PAC PT "6 Clicks" Mobility  Outcome Measure Help needed turning from your back to your side while in a flat bed without using bedrails?: None Help needed moving from lying on your back to sitting on the side of a flat bed without using bedrails?: None Help needed moving to and from a bed to a chair (including a wheelchair)?: None   Help needed to walk in hospital room?: None Help needed climbing 3-5 steps with a railing? : A Little 6 Click Score: 19    End of Session   Activity Tolerance: Patient  tolerated treatment well Patient left: in bed;with call bell/phone within reach (EEG tech entering room) Nurse Communication:  (mobility status, limited cognition assessment 2/2 communication deficits)      Time: 4098-1191 PT Time Calculation (min) (ACUTE ONLY): 10 min   Charges:   PT Evaluation $PT Eval Moderate Complexity: 1 Mod   PT General Charges $$ ACUTE PT VISIT: 1 Visit         Aleda Grana, PT, DPT 12/14/23, 10:43 AM   Sandi Mariscal 12/14/2023, 10:41 AM

## 2023-12-14 NOTE — Evaluation (Addendum)
 Occupational Therapy Evaluation Patient Details Name: Regina [name] MRN: [id number] DOB: 09/30/1965 Today's Date: 12/14/2023   History of Present Illness   Pt is a 58 y/o F admitted on 12/12/23 after presenting with confusion. Pt tested positive for COVID-19. Pt is being treated for acute encephalopathy. Head CTA showed extensive L frontal lobe vasogenic edema consistent with known mass in that region with associated regional mass effect with 4 mm left-to-right shift. 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 Pt currently close to baseline, mod I for ADLs and mobility, no LOB, little to no use of RUE but Pt able to use to don/doff socks, able to ambulate around room unassisted, completes toileting independently. Pt demos significantly limited R shoulder AROM/PROM, little to no AROM at elbow, and weak grip to R hand. Pt reports she is currently receiving OP therapy for RUE, would benefit from continued OP therapy to maximize functional use of RUE and prevent further stiffening. Will continue to see acutely to perform ROM and strength training to RUE.      If plan is discharge home, recommend the following:   Assistance with cooking/housework;A little help with bathing/dressing/bathroom     Functional Status Assessment   Patient has had a recent decline in their functional status and demonstrates the ability to make significant improvements in function in a reasonable and predictable amount of time.     Equipment Recommendations    None recommended by OT     Recommendations for Other Services         Precautions/Restrictions   Precautions Precautions: Other (comment) (COVID+) Precaution/Restrictions Comments: COVID+. Pt with recent hx of RUE fx, has sleeve on R forearm (states ortho MD gave it to her) Restrictions Weight Bearing Restrictions Per Provider Order: No     Mobility Bed Mobility Overal bed mobility: Modified Independent                  Transfers Overall transfer level: Modified independent                        Balance Overall balance assessment: Modified Independent                                         ADL either performed or assessed with clinical judgement   ADL Overall ADL's : At baseline;Modified independent                                             Vision Baseline Vision/History: 1 Wears glasses Ability to See in Adequate Light: 0 Adequate Patient Visual Report: No change from baseline       Perception         Praxis         Pertinent Vitals/Pain Pain Assessment Pain Assessment: 0-10 Pain Score: 9  Pain Location: R  arm and L leg Pain Descriptors / Indicators: Grimacing, Guarding, Discomfort Pain Intervention(s): Monitored during session     Extremity/Trunk Assessment Upper Extremity Assessment Upper Extremity Assessment: RUE deficits/detail RUE Deficits / Details: recent R humeral fx and surgery, little to no functinoal use of RUE, uses LUE to perform AAROM to position during activities or at rest. RUE: Shoulder pain at rest RUE Coordination: decreased fine motor;decreased gross motor   Lower Extremity Assessment Lower Extremity Assessment: Defer to PT evaluation       Communication Communication Communication: Impaired Factors Affecting Communication: Reduced clarity of speech   Cognition Arousal: Alert Behavior During Therapy: [facility] for tasks assessed/performed                                  Following commands: Intact       Cueing  General Comments   Cueing Techniques: Verbal cues      Exercises     Shoulder Instructions      Home Living Family/patient expects to be discharged to:: Private residence Living Arrangements: Other relatives (brother and son) Available Help at Discharge: Family;Available 24 hours/day Type of Home: Apartment Home Access: Level entry     Home Layout: One level     Bathroom Shower/Tub: Chief Strategy Officer: Standard     Home Equipment: Shower seat   Additional Comments: Pt lives with son and brother, 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  Lives With: Son;Family    Prior Functioning/Environment Prior Level of Function : Independent/Modified Independent;Driving             Mobility Comments: Ambulatory without AD, denies any other falls ADLs Comments: Per chart, pt works at [employer], pt reports she has not returned to work yet    OT Problem List: Decreased range of motion;Decreased strength;Pain;Impaired UE functional use   OT Treatment/Interventions: Therapeutic exercise;Manual therapy;Therapeutic activities      OT Goals(Current goals can be found in the care plan section)   Acute Rehab OT Goals Patient Stated Goal: to return home OT Goal Formulation: All assessment and education complete, DC therapy Time For Goal Achievement: 12/28/23 Potential to Achieve Goals: Good ADL Goals Pt/caregiver will Perform Home Exercise Program: Right Upper extremity;With theraband;Independently;With written HEP provided;Increased ROM;Increased strength Additional ADL Goal #1: Pt will be able to perform self AAROM to RUE to improve strength and ROM to RUE independently with written HEP.   OT Frequency:  Min 2X/week    Co-evaluation              AM-PAC OT "6 Clicks" Daily Activity     Outcome Measure Help from another person eating meals?: None Help from another person  taking care of personal grooming?: None Help from another person toileting, which includes using toliet, bedpan, or urinal?: None Help from another person bathing (including washing, rinsing, drying)?: None Help from another person to put on and taking off regular upper body clothing?: None Help from another person to put on and taking off regular lower body clothing?: None 6 Click Score: 24   End of Session Nurse Communication: Mobility status  Activity Tolerance: Patient tolerated treatment well Patient left: in bed;with call bell/phone within reach  OT Visit Diagnosis: Muscle weakness (generalized) (M62.81);Pain Pain - Right/Left: Right Pain - part of body: Shoulder;Arm                Time: 1610-9604 OT Time  Calculation (min): 16 min Charges:  OT General Charges $OT Visit: 1 Visit OT Evaluation $OT Eval Low Complexity: 1 Low  [address] OTR/L   Alexis Goodell 12/14/2023, 3:56 PM

## 2023-12-14 NOTE — Procedures (Signed)
 Patient Name: Regina [name]  MRN: [id number]  Epilepsy Attending: Charlsie Quest  Referring Physician/Provider: Maretta Bees, MD  Date: 12/14/2023 Duration: 22.28 mins  Patient history: 58yo F with ams. EEG to evaluate for seizure  Level of alertness: Awake  AEDs during EEG study: None  Technical aspects: This EEG study was done with scalp electrodes positioned according to the 10-20 International system of electrode placement. Electrical activity was reviewed with band pass filter of 1-70Hz , sensitivity of 7 uV/mm, display speed of 27mm/sec with a 60Hz  notched filter applied as appropriate. EEG data were recorded continuously and digitally stored.  Video monitoring was available and reviewed as appropriate.  Description: The posterior dominant rhythm consists of 7.5 Hz activity of moderate voltage (25-35 uV) seen predominantly in posterior head regions, symmetric and reactive to eye opening and eye closing. EEG showed continuous 3 to 6 Hz theta-delta slowing in left hemisphere. There is also 3-5Hz  sharply contoured theta-delta slowing in left frontal region consistent with breach artifact. Hyperventilation and photic stimulation were not performed.     ABNORMALITY - Breach artifact, left frontal region - Continuous slow, left hemisphere  IMPRESSION: This study is suggestive of cortical dysfunction in left frontal region consistent with underlying craniotomy. Additionally there is  cortical dysfunction arising from left hemisphere likely secondary to underlying structural abnormality. No seizures or definite epileptiform discharges were seen throughout the recording.  Elita Dame Annabelle Harman

## 2023-12-14 NOTE — Evaluation (Signed)
 Speech Language Pathology Evaluation Patient Details Name: Regina [name] MRN: [id number] DOB: 1966-08-02 Today's Date: 12/14/2023 Time: 9147-8295 SLP Time Calculation (min) (ACUTE ONLY): 18 min  Problem List:  Patient Active Problem List   Diagnosis Date Noted   Acute metabolic encephalopathy 12/13/2023   Expressive aphasia 12/13/2023   SIRS (systemic inflammatory response syndrome) (HCC) 12/13/2023   Lactic acidosis 12/13/2023   History of COVID-19 12/13/2023   Metabolic acidosis with increased anion gap and accumulation of organic acids 12/13/2023   Pathologic fracture 11/22/2023   Hypokalemia 11/22/2023   Closed fracture of shaft of right humerus 10/10/2023   Pressure injury of skin 10/10/2023   Cerebral intracranial abscess 10/07/2023   Palliative care encounter 10/04/2023   Pathological fracture of right humerus with delayed healing 10/04/2023   Lung cancer metastatic to brain (HCC) 10/04/2023   Vasogenic brain edema (HCC) 10/04/2023   Counseling and coordination of care 10/04/2023   Protein-calorie malnutrition, severe 10/04/2023   Metastasis to brain (HCC) 10/03/2023   Metastasis to bone (HCC) 10/03/2023   Cerebral edema (HCC) 10/01/2023   Primary adenocarcinoma of upper lobe of right lung (HCC) 09/22/2023   Pathological fracture in neoplastic disease, right humerus, initial encounter for fracture 09/19/2023   Neoplasm causing mass effect and brain compression on adjacent structures (HCC) 09/18/2023   COPD (chronic obstructive pulmonary disease) (HCC)    Anxiety    Hyponatremia 04/16/2018   Depression 04/16/2018   Hypertensive urgency    HTN (hypertension) 01/13/2016   Right anterior shoulder pain 07/18/2014   Postconcussion syndrome 05/28/2014   Past Medical History:  Past Medical History:  Diagnosis Date   Allergy    Anxiety    Brain mass    COPD (chronic obstructive pulmonary disease) (HCC)    Emphysema of lung (HCC)    Hypertension    Past Surgical  History:  Past Surgical History:  Procedure Laterality Date   APPLICATION OF CRANIAL NAVIGATION Left 10/07/2023   Procedure: APPLICATION OF CRANIAL NAVIGATION;  Surgeon: Coletta Memos, MD;  Location: [facility] OR;  Service: Neurosurgery;  Laterality: Left;   CRANIOTOMY Left 10/07/2023   Procedure: FRONTAL CRANIOTOMY FOR ABSCESS;  Surgeon: Coletta Memos, MD;  Location[facility];  Service: Neurosurgery;  Laterality: Left;   dental procedure     HUMERUS IM NAIL Right 10/10/2023   Procedure: INTRAMEDULLARY (IM) NAIL HUMERAL RIGHT;  Surgeon: Huel Cote, MD;  Location: [facility] OR;  Service: Orthopedics;  Laterality: Right;   HPI:  Regina [name] is a 57 y.o. 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 Testing for COVIS was positive. CT Extensive vasogenic edema throughout the left frontal lobe,  consistent with known mass in this region. The mass itself is not well visualized on this noncontrast examination. Postoperative changes from prior left frontal craniotomy. Small residual postoperative collection subjacent to the craniotomy bone flap measures up to 4 mm in maximal thickness.  History obtained from patient, but limited due to difficulty in her speech. Per notes from MD conversation with niece pt normally has more clear speech is noticed that when she had difficulty speaking last time it was related to the brain tumor.   Assessment / Plan / Recommendation Clinical Impression  Pt has a history of dysphagia and difficult to determine changes from baseline. Pt's responses were  inconsistent when asked how her current expressive language abilities compared to prior. She exhbits a mostly fluent aphasia with phonemic paraphasias  at the sentence and conversational level and becomes somewhat frustrated at times. Naming objects was 100%. Her receptive language for basic infomration appeare functional and she followed one and two step commands without difficulty. She exhibits more difficulty when asked to describe activities and when length of utterances is longer. She was able to make request known to SLP. Overall, cognition appeared mostly intact for basic verbal and functional problem solving however she may break down when implementing activities. She recalled 1 of 3 words (both storage and retrieval deficits) that may have been affected by language impairments. SLP wil see pt for minimum of one tme a week for language facilitation.    SLP Assessment  SLP Recommendation/Assessment: Patient needs continued Speech Lanaguage Pathology Services SLP Visit Diagnosis: Cognitive communication deficit (R41.841)    Recommendations for follow up therapy are one component of a multi-disciplinary discharge planning process, led by the attending physician.  Recommendations may be updated based on patient status, additional functional criteria and insurance authorization.    Follow Up Recommendations  Follow physician's recommendations for discharge plan and follow up therapies    Assistance Recommended at Discharge  Intermittent Supervision/Assistance  Functional Status Assessment Patient has had a recent decline in their functional status and demonstrates the ability to make significant improvements in function in a reasonable and predictable amount of time.  Frequency and Duration min 1 x/week  2 weeks      SLP Evaluation Cognition  Overall Cognitive Status: History of cognitive impairments - at baseline (difficult to know if different from baseline) Arousal/Alertness: Awake/alert Orientation Level: Oriented to person;Oriented to place (disoriented to month) Year: 2025 Month: May Day of Week: Correct Attention:  Sustained Sustained Attention: Appears intact Memory: Impaired Memory Impairment: Retrieval deficit;Storage deficit (recalled 1/3 words) Awareness: Appears intact Problem Solving: Appears intact (for simple- need to assess further) Safety/Judgment: Appears intact       Comprehension  Auditory Comprehension Overall Auditory Comprehension: Appears within functional limits for tasks assessed Visual Recognition/Discrimination Discrimination: Not tested Reading Comprehension Reading Status: Not tested    Expression Expression Primary Mode of Expression: Verbal Verbal Expression Overall Verbal Expression: Impaired at baseline (difficult to know how different from baseline and pt wasn't consistent with responses to baseline and current language) Initiation: No impairment Level of Generative/Spontaneous Verbalization: Conversation Repetition: No impairment Naming: No impairment (for common objects) Pragmatics: No impairment Written Expression Written Expression: Not tested   Oral / Motor  Oral Motor/Sensory Function Overall Oral Motor/Sensory Function: Within functional limits Motor Speech Overall Motor Speech: Appears within functional limits for tasks assessed Respiration: Within functional limits Phonation: Normal Resonance: Within functional limits Articulation: Within functional limitis Intelligibility: Intelligibility reduced (but due to aphasia) Motor Planning: Witnin functional limits Motor Speech Errors: Not applicable            Royce Macadamia 12/14/2023, 10:01 AM

## 2023-12-14 NOTE — Progress Notes (Signed)
   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

## 2023-12-14 NOTE — Plan of Care (Signed)
 progressing

## 2023-12-14 NOTE — Consult Note (Signed)
 Consultation Note Date: 12/14/2023   Patient Name: Regina [name]  DOB: [birth year]  MRN: [id number]  Age / Sex: 58 y.o., female  PCP: [name], NP Referring Physician: Maretta Bees, MD  Reason for Consultation: Establishing goals of care  HPI/Patient Profile: 58 y.o. 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 CT head shows extensive vasogenic edema throughout left frontal lobe with expressive aphagia - started on decadron. COVID + with worsening cough. Notes indicating missed oncology appointments and delay in treatment. 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   Clinical Assessment and Goals of Care: Consult received and chart review completed. I met today with La and no family/visitors at bedside. Jonah is awake and is working on a Museum/gallery exhibitions officer and [employer] (she has only eaten a small amount). I spoke with Arlissa about her current health situation. She tells me that she is feeling better. She acknowledges to feeling overwhelmed. I discussed her advanced cancer and treatment options. We discussed intention of treatment to try and maintain the cancer from progressing but will not make the cancer go away. Ms. Frosch acknowledges understanding. She indicates to me that she does not believe she wishes to have treatment - BUT she is not sure. She has not made a final decision. I acknowledge that I would understand if she does not desire treatment because she is concerned for suffering, side effects, and/or decline in quality of life. She shares that her niece wants her to have treatment but she is not sure she wants to do that. I did speak with Ethyle  about other decisions that follow. I explained that she should also consider her wishes for undergoing CPR and life support if we know her cancer is not curable as these interventions will likely only cause her suffering at the end of her life. I encouraged her to consider her choices. She gives me permission to speak with niece, [name].   I called and spoke with [name]. [name] and I review Ms. Kazmierczak missed appointments and avoidance of her diagnosis. She continues to put off making decisions. [name] is trying to support Korrina but need Nateisha to also be part of the process. There is concern that Jo-Ann is avoiding dealing with her illness and sometimes has significant denial ([name] shares that Marlean's mother was the same way). [name] has been trying to assist with Mabrey's brother who Janyla cares for. [name] shares that he has never had a legal guardian and is concerned about trying to care for him if something happens to Petrice. [name] has been in contact with DSS and social workers to try and plan. I discussed with [name] my concerns for the decisions she may be faced with if Liyla continues to put off decisions. [name] is aware and prepared to work with family to make decisions for [facility]  if needed. I reassured [name] that we will continue to work with Melodi to try and support and elicit goals of care and decisions.   All questions/concerns addressed. Emotional support provided.   Primary Decision Maker PATIENT HCPOA niece [name]    SUMMARY OF RECOMMENDATIONS   - Full code - Undecided on desire for cancer treatment - Ongoing conversations needed  Code Status/Advance Care Planning: Full code   Symptom Management:  Per attending  Prognosis:  Prognosis poor with advanced cancer.   Discharge Planning: To Be Determined      Primary Diagnoses: Present on Admission:  Acute metabolic encephalopathy  Expressive aphasia  SIRS (systemic inflammatory response  syndrome) (HCC)  Lactic acidosis  History of COVID-19  Hypokalemia  Primary adenocarcinoma of upper lobe of right lung (HCC)  Cerebral edema (HCC)  Metastasis to brain (HCC)  Metastasis to bone (HCC)  Metabolic acidosis with increased anion gap and accumulation of organic acids   I have reviewed the medical record, interviewed the patient and family, and examined the patient. The following aspects are pertinent.  Past Medical History:  Diagnosis Date   Allergy    Anxiety    Brain mass    COPD (chronic obstructive pulmonary disease) (HCC)    Emphysema of lung (HCC)    Hypertension    Social History   Socioeconomic History   Marital status: Widowed    Spouse name: Not on file   Number of children: 1    Years of education: 32    Highest education level: Not on file  Occupational History   Occupation: Set designer: [employer]    Comment: Nurse, mental health   Tobacco Use   Smoking status: Former    Current packs/day: 0.00    Types: Cigarettes    Quit date: 10/31/2019    Years since quitting: 4.1   Smokeless tobacco: Never  Vaping Use   Vaping status: Never Used  Substance and Sexual Activity   Alcohol use: Yes    Alcohol/week: 6.0 standard drinks of alcohol    Types: 6 Cans of beer per week   Drug use: Yes    Frequency: 4.0 times per week    Types: Marijuana   Sexual activity: Not Currently  Other Topics Concern   Not on file  Social History Narrative   Lives with her brother.    Son lives near by, 2 yo, married.    Social Drivers of Corporate investment banker Strain: Not on file  Food Insecurity: No Food Insecurity (12/13/2023)   Hunger Vital Sign    Worried About Running Out of Food in the Last Year: Never true    Ran Out of Food in the Last Year: Never true  Transportation Needs: No Transportation Needs (12/13/2023)   PRAPARE - Administrator, Civil Service (Medical): No    Lack of Transportation (Non-Medical): No   Physical Activity: Not on file  Stress: Not on file  Social Connections: Not on file   Family History  Problem Relation Age of Onset   Leukemia Mother    Hypertension Mother    Hypertension Father    Cancer Neg Hx    Diabetes Neg Hx    Heart disease Neg Hx    Colon cancer Neg Hx    Esophageal cancer Neg Hx    Rectal cancer Neg Hx    Stomach cancer Neg Hx    Scheduled Meds:  amLODipine  5 mg Oral  Daily   dexamethasone  4 mg Oral Q6H   diclofenac Sodium  2 g Topical QID   guaiFENesin  600 mg Oral BID   heparin  5,000 Units Subcutaneous Q8H   sodium chloride flush  3 mL Intravenous Q12H   Continuous Infusions: PRN Meds:.acetaminophen **OR** acetaminophen, HYDROcodone-acetaminophen, levalbuterol, melatonin, ondansetron (ZOFRAN) IV Allergies  Allergen Reactions   Pork Allergy Nausea And Vomiting   Bee Venom Hives   Review of Systems  Constitutional:  Positive for activity change and appetite change.    Physical Exam Vitals and nursing note reviewed.  Constitutional:      General: She is not in acute distress.    Appearance: Normal appearance.     Comments: Thin   Cardiovascular:     Rate and Rhythm: Normal rate.  Pulmonary:     Effort: No tachypnea, accessory muscle usage or respiratory distress.  Abdominal:     General: Abdomen is flat.     Palpations: Abdomen is soft.  Neurological:     Mental Status: She is alert and oriented to person, place, and time.     Vital Signs: BP (!) 143/88 (BP Location: Right Arm)   Pulse 91   Temp 98.2 F (36.8 C) (Oral)   Resp (!) 27   Ht 5\' 6"  (1.676 m)   Wt 55.8 kg   LMP 06/06/2010   SpO2 98%   BMI 19.86 kg/m  Pain Scale: 0-10   Pain Score: 10-Worst pain ever   SpO2: SpO2: 98 % O2 Device:SpO2: 98 % O2 Flow Rate: .   IO: Intake/output summary:  Intake/Output Summary (Last 24 hours) at 12/14/2023 0957 Last data filed at 12/13/2023 1053 Gross per 24 hour  Intake 100 ml  Output --  Net 100 ml    LBM:    Baseline Weight: Weight: 55.8 kg Most recent weight: Weight: 55.8 kg     Palliative Assessment/Data:    Time Total: 80 min  Greater than 50%  of this time was spent counseling and coordinating care related to the above assessment and plan.  Signed by: Yong Channel, NP Palliative Medicine Team Pager # [phone] (M-F 8a-5p) Team Phone # [phone] (Nights/Weekends)

## 2023-12-14 NOTE — Progress Notes (Signed)
 EEG complete - results pending

## 2023-12-15 ENCOUNTER — Encounter: Payer: Self-pay | Admitting: Internal Medicine

## 2023-12-15 ENCOUNTER — Other Ambulatory Visit: Payer: Self-pay | Admitting: Radiation Therapy

## 2023-12-15 ENCOUNTER — Other Ambulatory Visit (HOSPITAL_COMMUNITY): Payer: Self-pay

## 2023-12-15 DIAGNOSIS — R4701 Aphasia: Secondary | ICD-10-CM | POA: Diagnosis not present

## 2023-12-15 DIAGNOSIS — G9341 Metabolic encephalopathy: Secondary | ICD-10-CM | POA: Diagnosis not present

## 2023-12-15 DIAGNOSIS — G936 Cerebral edema: Secondary | ICD-10-CM | POA: Diagnosis not present

## 2023-12-15 DIAGNOSIS — R6510 Systemic inflammatory response syndrome (SIRS) of non-infectious origin without acute organ dysfunction: Secondary | ICD-10-CM

## 2023-12-15 LAB — TSH: TSH: 1.086 u[IU]/mL (ref 0.350–4.500)

## 2023-12-15 MED ORDER — METOPROLOL TARTRATE 25 MG PO TABS
25.0000 mg | ORAL_TABLET | Freq: Two times a day (BID) | ORAL | Status: DC
  Administered 2023-12-15: 25 mg via ORAL
  Filled 2023-12-15: qty 1

## 2023-12-15 MED ORDER — PANTOPRAZOLE SODIUM 40 MG PO TBEC
40.0000 mg | DELAYED_RELEASE_TABLET | Freq: Every day | ORAL | 1 refills | Status: DC
  Filled 2023-12-15: qty 30, 30d supply, fill #0

## 2023-12-15 MED ORDER — LEVETIRACETAM 500 MG PO TABS
500.0000 mg | ORAL_TABLET | Freq: Two times a day (BID) | ORAL | Status: DC
Start: 2023-12-15 — End: 2023-12-15

## 2023-12-15 MED ORDER — LEVETIRACETAM 500 MG PO TABS
500.0000 mg | ORAL_TABLET | Freq: Two times a day (BID) | ORAL | 1 refills | Status: DC
  Filled 2023-12-15 – 2024-01-23 (×2): qty 60, 30d supply, fill #0

## 2023-12-15 MED ORDER — METOPROLOL TARTRATE 25 MG PO TABS
25.0000 mg | ORAL_TABLET | Freq: Two times a day (BID) | ORAL | 1 refills | Status: DC
  Filled 2023-12-15: qty 60, 30d supply, fill #0

## 2023-12-15 MED ORDER — DEXAMETHASONE 2 MG PO TABS
ORAL_TABLET | ORAL | 1 refills | Status: AC
  Filled 2023-12-15 – 2023-12-27 (×2): qty 90, 30d supply, fill #0

## 2023-12-15 MED ORDER — LEVETIRACETAM 500 MG PO TABS
1000.0000 mg | ORAL_TABLET | Freq: Once | ORAL | Status: AC
  Administered 2023-12-15: 1000 mg via ORAL
  Filled 2023-12-15: qty 2

## 2023-12-15 NOTE — Plan of Care (Signed)

## 2023-12-15 NOTE — Discharge Summary (Signed)
 PATIENT DETAILS Name: Regina [name] Age: 58 y.o. Sex: female Date of Birth: 05/30/1966 MRN: [id number]. Admitting Physician: Regina Braun, MD NWG:[id number], Regina Scales, NP  Admit Date: 12/12/2023 Discharge date: 12/15/2023  Recommendations for Outpatient Follow-up:  Follow up with PCP in 1-2 weeks Please obtain CMP/CBC in one week Please follow up with patient oncology, medical oncology, palliative care.    Admitted From:  Home  Disposition: Home   Discharge Condition: good  CODE STATUS:   Code Status: Full Code   Diet recommendation:  Diet Order             Diet - low sodium heart healthy           Diet regular Room service appropriate? Yes; Fluid consistency: Thin  Diet effective now                    Brief Summary: Patient is a 58 y.o.  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   Significant events: 4/7>> confusion/expressive aphasia-admit to [facility].   Significant studies: 4/7>> CT head: Extensive vasogenic edema throughout left frontal lobe consistent with mass in this region. 4/7>> CT angio head/neck: No LVO or significant stenosis. 4/7 >>CT angio chest: Negative for PE-emphysema/fibrosis/honeycombing at lung bases.  Redemonstrated right apical lung mass.  Mediastinal/bilateral hilar lymphadenopathy.  Lytic lesion in the mid humerus. 4/9>> EEG: Cortical dysfunction left frontal region-no seizures/definite epileptiform discharges. 4/9>> MRI brain: Left frontal craniotomy-partial resection of the previously seen mass-amount of regional vasogenic edema appears slightly reduced.   Significant  microbiology data: 4/7>> COVID PCR: Positive 4/7>> influenza/RSV PCR: Negative 4/8>> blood culture: No growth   Procedures: None   Consults: Palliative care  Brief Hospital Course: Acute encephalopathy/expressive aphasia Suspect this is from ongoing vasogenic edema/left frontal lobe mass-improved after initiation of steroids.  She has some residual expressive aphasia at baseline-which is currently unchanged-her mentation is much improved-she is completely awake and alert this morning.Repeat MRI brain/EEG as above.  Some suspicion that this probably could be from seizures as well.  Case was discussed with neurologist-Dr. Kirkpatrick-recommends initiation of Keppra (apparently she was on Keppra post craniotomy-and family is unsure how it got discontinued).  Will keep a slow Decadron taper-and keep her on Decadron till she is seen by her outpatient physicians. Patient aware of driving restrictions/seizure precautions (note she claims she does not drive any longer)   [id number] infection Suspect incidental finding Asymptomatic-without any cough/URI symptoms-doubt any treatment required.  Do not think this is the cause of her encephalopathy.   SIRS Likely noninfectious All cultures negative   Hyponatremia Mild Follow   Hypokalemia Repleted-follow   HTN BP stable Stopping amlodipine and starting beta-blocker-she has been intermittently slightly tachycardic during this hospitalization.  TSH stable.   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 She has not yet followed with medical oncology-as she keeps postponing appointments-reached out to her primary oncologist-Dr. Mohamed-his office will continue attempts to reach out to patient.  Unfortunately-patient is still struggling to make a decision as to undergo systemic therapy for her underlying cancer. Please ensure  patient follows up with radiation oncology.   Underweight: Estimated body mass index is 19.86  kg/m as calculated from the following:   Height as of this encounter: 5\' 6"  (1.676 m).   Weight as of this encounter: 55.8 kg.  Note an updated niece-on day of discharge.  Discharge Diagnoses:  Principal Problem:   Acute metabolic encephalopathy Active Problems:   Cerebral edema (HCC)   Expressive aphasia   SIRS (systemic inflammatory response syndrome) (HCC)   Lactic acidosis   History of COVID-19   Hypokalemia   Primary adenocarcinoma of upper lobe of right lung (HCC)   Metastasis to brain (HCC)   Metastasis to bone (HCC)   Metabolic acidosis with increased anion gap and accumulation of organic acids   Discharge Instructions:  Activity:  As tolerated with Full fall precautions use walker/cane & assistance as needed  Discharge Instructions     Call MD for:  extreme fatigue   Complete by: As directed    Call MD for:  persistant dizziness or light-headedness   Complete by: As directed    Diet - low sodium heart healthy   Complete by: As directed    Discharge instructions   Complete by: As directed    Follow with Primary MD  Regina Sessions, NP in 1-2 weeks  Please follow-up with your medical oncologist-Dr. Mohamed-and radiation oncology-Dr. Basilio [name] in the next 1-2 weeks.  Please get a complete blood count and chemistry panel checked by your Primary MD at your next visit, and again as instructed by your Primary MD.  Get Medicines reviewed and adjusted: Please take all your medications with you for your next visit with your Primary MD  Laboratory/radiological data: Please request your Primary MD to go over all hospital tests and procedure/radiological results at the follow up, please ask your Primary MD to get all Hospital records sent to his/her office.  In some cases, they will be blood work, cultures and biopsy results pending at the time of your discharge. Please  request that your primary care M.D. follows up on these results.  Also Note the following: If you experience worsening of your admission symptoms, develop shortness of breath, life threatening emergency, suicidal or homicidal thoughts you must seek medical attention immediately by calling 911 or calling your MD immediately  if symptoms less severe.  You must read complete instructions/literature along with all the possible adverse reactions/side effects for all the Medicines you take and that have been prescribed to you. Take any new Medicines after you have completely understood and accpet all the possible adverse reactions/side effects.   Do not drive when taking Pain medications or sleeping medications (Benzodaizepines)  Do not take more than prescribed Pain, Sleep and Anxiety Medications. It is not advisable to combine anxiety,sleep and pain medications without talking with your primary care practitioner  Special Instructions: If you have smoked or chewed Tobacco  in the last 2 yrs please stop smoking, stop any regular Alcohol  and or any Recreational drug use.  Wear Seat belts while driving.  Please note: You were cared for by a hospitalist during your hospital stay. Once you are discharged, your primary care physician will handle any further medical issues. Please note that NO REFILLS for any discharge medications will be authorized once you are discharged, as it is imperative that you return to your primary care physician (or establish a relationship with a primary care physician if you do not have one) for your post hospital discharge needs so that they can reassess your need for medications and monitor your lab values.  Seizure precautions: Per [facility] statutes, patients with seizures are not allowed to drive until they have been seizure-free for six months and cleared by a physician    Use caution when using heavy equipment or power tools. Avoid working on ladders or at  heights. Take showers instead of baths. Ensure the water temperature is not too high on the home water heater. Do not go swimming alone. Do not lock yourself in a room alone (i.e. bathroom). When caring for infants or small children, sit down when holding, feeding, or changing them to minimize risk of injury to the child in the event you have a seizure. Maintain good sleep hygiene. Avoid alcohol.    If patient has another seizure, call 911 and bring them back to the ED if: A.  The seizure lasts longer than 5 minutes.      B.  The patient doesn't wake shortly after the seizure or has new problems such as difficulty seeing, speaking or moving following the seizure C.  The patient was injured during the seizure D.  The patient has a temperature over 102 F (39C) E.  The patient vomited during the seizure and now is having trouble breathing    During the Seizure   - First, ensure adequate ventilation and place patients on the floor on their left side  Loosen clothing around the neck and ensure the airway is patent. If the patient is clenching the teeth, do not force the mouth open with any object as this can cause severe damage - Remove all items from the surrounding that can be hazardous. The patient may be oblivious to what's happening and may not even know what he or she is doing. If the patient is confused and wandering, either gently guide him/her away and block access to outside areas - Reassure the individual and be comforting - Call 911. In most cases, the seizure ends before EMS arrives. However, there are cases when seizures may last over 3 to 5 minutes. Or the individual may have developed breathing difficulties or severe injuries. If a pregnant patient or a person with diabetes develops a seizure, it is prudent to call an ambulance. - Finally, if the patient does not regain full consciousness, then call EMS. Most patients will remain confused for about 45 to 90 minutes after a seizure, so you  must use judgment in calling for help. - Avoid restraints but make sure the patient is in a bed with padded side rails - Place the individual in a lateral position with the neck slightly flexed; this will help the saliva drain from the mouth and prevent the tongue from falling backward - Remove all nearby furniture and other hazards from the area - Provide verbal assurance as the individual is regaining consciousness - Provide the patient with privacy if possible - Call for help and start treatment as ordered by the caregiver    After the Seizure (Postictal Stage)   After a seizure, most patients experience confusion, fatigue, muscle pain and/or a headache. Thus, one should permit the individual to sleep. For the next few days, reassurance is essential. Being calm and helping reorient the person is also of importance.   Most seizures are painless and end spontaneously. Seizures are not harmful to others but can lead to complications such as stress on the lungs, brain and the heart. Individuals with prior lung problems may develop labored breathing and respiratory distress.    Increase activity slowly   Complete by: As  directed       Allergies as of 12/15/2023       Reactions   Pork Allergy Nausea And Vomiting   Bee Venom Hives        Medication List     STOP taking these medications    amLODipine 5 MG tablet Commonly known as: NORVASC       TAKE these medications    cyclobenzaprine 10 MG tablet Commonly known as: FLEXERIL Take 1 tablet (10 mg total) by mouth at bedtime for 10 days.   dexamethasone 1 MG tablet Commonly known as: DECADRON 4 tablets (4 mg) by mouth-Q 6 hours x 3 more days, then 3 tablets (3 mg) by mouth every 8 hours x 5 days, and then, 2 tablets (2 mg) by mouth every 12 hours and stay on this till seen by medical oncology/radiation oncology.   diclofenac Sodium 1 % Gel Commonly known as: Voltaren Apply 2 g topically 4 (four) times daily.    levETIRAcetam 500 MG tablet Commonly known as: KEPPRA Take 1 tablet (500 mg total) by mouth 2 (two) times daily.   metoprolol tartrate 25 MG tablet Commonly known as: LOPRESSOR Take 1 tablet (25 mg total) by mouth 2 (two) times daily.   pantoprazole 40 MG tablet Commonly known as: Protonix Take 1 tablet (40 mg total) by mouth daily.        Follow-up Information     Regina Sessions, NP. Schedule an appointment as soon as possible for a visit in 1 week(s).   Specialty: Internal Medicine Contact information: [address] [place] [address] [phone]         Si Gaul, MD. Schedule an appointment as soon as possible for a visit in 1 week(s).   Specialty: Oncology Contact information: [address] [place] [address] [phone]         Lonie Peak, MD. Schedule an appointment as soon as possible for a visit in 1 week(s).   Specialty: Radiation Oncology Contact information: [address] [place] [address] [phone]                Allergies  Allergen Reactions   Pork Allergy Nausea And Vomiting   Bee Venom Hives     Other Procedures/Studies: MR BRAIN W WO CONTRAST Result Date: 12/14/2023 CLINICAL DATA:  Metastatic disease evaluation. Stage IV non-small cell lung cancer treated with radiation in January. EXAM: MRI HEAD WITHOUT AND WITH CONTRAST TECHNIQUE: Multiplanar, multiecho pulse sequences of the brain and surrounding structures were obtained without and with intravenous contrast. CONTRAST:  5.68mL GADAVIST GADOBUTROL 1 MMOL/ML IV SOLN COMPARISON:  CT 2 days ago.  MRI 10/01/2023 FINDINGS: Brain: Diffusion imaging does not show any acute or subacute infarction or other cause of restricted diffusion. Interval left frontal craniotomy for mass resection. No focal abnormality seen affecting the brainstem or cerebellum. Right cerebral hemisphere shows chronic small-vessel ischemic changes of the white matter as  seen previously. On the left, there is been previous frontal craniotomy for partial resection of the previously seen mass lesion. Previous measurements were 3.2 x 3.4 x 2.5 cm. Today, the lesion measures 2.0 x 1.7 x 1.4 cm. Small resection cavity is present superior to the enhancing mass lesion. No second lesion is identified. The amount of regional vasogenic edema appears slightly reduced, with the slight decrease in mass effect and reduction in left to right shift from 1 cm to 4 mm. No hydrocephalus. No extra-axial collection. Vascular: Major vessels at the base  of the brain show flow. Skull and upper cervical spine: Otherwise negative Sinuses/Orbits: Clear other than mild mucosal thickening of the left maxillary sinus. Orbits negative. Other: None IMPRESSION: Interval left frontal craniotomy for partial resection of the previously seen mass lesion. Previous measurements were 3.2 x 3.4 x 2.5 cm. Today, the lesion measures 2.0 x 1.7 x 1.4 cm. Small resection cavity is present superior to the enhancing mass lesion. The amount of regional vasogenic edema appears slightly reduced, with the slight decrease in mass effect and reduction in left to right shift from 1 cm to 4 mm. No second lesion is identified. Electronically Signed   By: Paulina Fusi M.D.   On: 12/14/2023 15:06   EEG adult Result Date: 12/14/2023 Charlsie Quest, MD     12/14/2023 11:58 AM Patient Name: Regina [name] MRN: [id number] Epilepsy Attending: Charlsie Quest Referring Physician/Provider: Maretta Bees, MD Date: 12/14/2023 Duration: 22.28 mins Patient history: 58yo F with ams. EEG to evaluate for seizure Level of alertness: Awake AEDs during EEG study: None Technical aspects: This EEG study was done with scalp electrodes positioned according to the 10-20 International system of electrode placement. Electrical activity was reviewed with band pass filter of 1-70Hz , sensitivity of 7 uV/mm, display speed of 84mm/sec with a 60Hz  notched filter  applied as appropriate. EEG data were recorded continuously and digitally stored.  Video monitoring was available and reviewed as appropriate. Description: The posterior dominant rhythm consists of 7.5 Hz activity of moderate voltage (25-35 uV) seen predominantly in posterior head regions, symmetric and reactive to eye opening and eye closing. EEG showed continuous 3 to 6 Hz theta-delta slowing in left hemisphere. There is also 3-5Hz  sharply contoured theta-delta slowing in left frontal region consistent with breach artifact. Hyperventilation and photic stimulation were not performed.   ABNORMALITY - Breach artifact, left frontal region - Continuous slow, left hemisphere IMPRESSION: This study is suggestive of cortical dysfunction in left frontal region consistent with underlying craniotomy. Additionally there is cortical dysfunction arising from left hemisphere likely secondary to underlying structural abnormality. No seizures or definite epileptiform discharges were seen throughout the recording. Charlsie Quest   CT Angio Chest PE W and/or Wo Contrast Result Date: 12/12/2023 CLINICAL DATA:  SVT positive D-dimer history of lung cancer EXAM: CT ANGIOGRAPHY CHEST WITH CONTRAST TECHNIQUE: Multidetector CT imaging of the chest was performed using the standard protocol during bolus administration of intravenous contrast. Multiplanar CT image reconstructions and MIPs were obtained to evaluate the vascular anatomy. RADIATION DOSE REDUCTION: This exam was performed according to the departmental dose-optimization program which includes automated exposure control, adjustment of the mA and/or kV according to patient size and/or use of iterative reconstruction technique. CONTRAST:  OMNIPAQUE IOHEXOL 350 MG/ML SOLN COMPARISON:  PET CT 10/27/2023, CT 09/17/2023 FINDINGS: Cardiovascular: Satisfactory opacification of the pulmonary arteries to the segmental level. No evidence of pulmonary embolism. Mild atherosclerosis.  No aneurysm. Normal cardiac size. No pericardial effusion Mediastinum/Nodes: Patent trachea. No thyroid mass. Right supraclavicular nodes measuring up to 7 mm. Redemonstrated multiple enlarged paratracheal, bilateral hilar and subcarinal adenopathy consistent with metastatic disease. Index right paratracheal node measures about 15 mm on series 6, image 51, compared with 14 mm previously. Right hilar nodes measuring up to 14 mm on series 6, image 72, previously 14 mm. Left hilar nodes measuring up to 14 mm on series 6, image 80, previously 13 mm. Esophagus within normal limits. Lungs/Pleura: Emphysema. Honeycombing and fibrosis at the lung bases. No acute airspace disease.  Pleuroparenchymal scarring at the apices. Lobulated right apical lung mass measuring 2.7 x 1.9 cm on series 8, image 19, previously 2.9 by 1.5 cm. Upper Abdomen: No acute abnormality. Musculoskeletal: No chest wall abnormality. No acute or significant osseous findings. Lytic lesion in the mid right humerus on scout image. Review of the MIP images confirms the above findings. IMPRESSION: 1. Negative for acute pulmonary embolus. 2. Emphysema. Fibrosis and honeycombing at the lung bases. No acute airspace disease. 3. Redemonstrated right apical lung mass consistent with known malignancy. Mediastinal and bilateral hilar adenopathy consistent with metastatic disease, similar to prior. Lytic lesion in the mid right humerus on scout image consistent with metastatic disease. Aortic Atherosclerosis (ICD10-I70.0) and Emphysema (ICD10-J43.9). Electronically Signed   By: Jasmine Pang M.D.   On: 12/12/2023 23:40   CT ANGIO HEAD NECK W WO CM Result Date: 12/12/2023 CLINICAL DATA:  Initial evaluation for acute speech changes. EXAM: CT ANGIOGRAPHY HEAD AND NECK WITH AND WITHOUT CONTRAST TECHNIQUE: Multidetector CT imaging of the head and neck was performed using the standard protocol during bolus administration of intravenous contrast. Multiplanar CT image  reconstructions and MIPs were obtained to evaluate the vascular anatomy. Carotid stenosis measurements (when applicable) are obtained utilizing NASCET criteria, using the distal internal carotid diameter as the denominator. RADIATION DOSE REDUCTION: This exam was performed according to the departmental dose-optimization program which includes automated exposure control, adjustment of the mA and/or kV according to patient size and/or use of iterative reconstruction technique. CONTRAST:  OMNIPAQUE IOHEXOL 350 MG/ML SOLN COMPARISON:  Prior study from 10/10/2023 and earlier. FINDINGS: CT HEAD FINDINGS Brain: Persistent vasogenic edema throughout the left frontal lobe, consistent with no in mass lesion in this region. The mass itself is not well visualized on this noncontrast examination. Partial effacement of the left lateral ventricle with 4 mm of left-to-right shift. No other mass lesion or mass effect. No acute large vessel territory infarct. No hydrocephalus. Postoperative changes from prior left frontal craniotomy. Small residual postoperative collection subjacent to the craniotomy bone flap measures up to 4 mm in maximal thickness (series 9, image 27). Vascular: No abnormal hyperdense vessel. Skull: Post craniotomy changes on the left. Soft tissue swelling present at the right occipital scalp. Calvarium intact. Sinuses/Orbits: Globes and orbital soft tissues within normal limits. Mucosal thickening with air-fluid level noted within the left maxillary sinus. Paranasal sinuses are otherwise clear. No mastoid effusion. Other: None. Review of the MIP images confirms the above findings CTA NECK FINDINGS Aortic arch: Examination degraded by motion artifact. Visualized aortic arch within normal limits for caliber with standard 3 vessel morphology. No significant stenosis about the origin the great vessels. Right carotid system: Right common and internal carotid arteries are patent within the neck. No visible  dissection. Atheromatous plaque about the right carotid bulb without hemodynamically significant greater than 50% stenosis. Left carotid system: Left common and internal carotid arteries are patent within the neck. No visible dissection. No hemodynamically significant stenosis about the left carotid artery system. Vertebral arteries: Both vertebral arteries arise from the subclavian arteries. Left vertebral artery dominant. Vertebral arteries are patent without visible dissection or stenosis. Skeleton: No worrisome osseous lesions. Other neck: No other acute abnormality within the neck. Upper chest: Emphysema. Irregular biapical pleuroparenchymal pleural based right upper lobe mass, consistent with patient's known lung cancer. Scattered enlarged mediastinal, hilar, and right axillary lymph nodes noted, compatible with nodal metastatic disease. Findings better evaluated on concomitant CT of the chest. Review of the MIP images confirms the  above findings CTA HEAD FINDINGS Anterior circulation: Both internal carotid arteries widely patent to the termini without stenosis. A1 segments widely patent. Normal anterior communicating artery complex. Both anterior cerebral arteries widely patent to their distal aspects without stenosis. No M1 stenosis or occlusion. Normal MCA bifurcations. Distal MCA branches well perfused and symmetric. Posterior circulation: Left V4 segment dominant and widely patent to the vertebrobasilar junction. Right vertebral artery terminates in PICA. Both PICA patent. Basilar patent without stenosis. Superior cerebellar and posterior cerebral arteries patent bilaterally. Venous sinuses: Patent allowing for timing the contrast bolus. Anatomic variants: As above.  No aneurysm. Review of the MIP images confirms the above findings IMPRESSION: CT HEAD: 1. Extensive vasogenic edema throughout the left frontal lobe, consistent with known mass in this region. The mass itself is not well visualized on this  noncontrast examination. Associated regional mass effect with 4 mm of left-to-right shift. 2. Postoperative changes from prior left frontal craniotomy with small residual postoperative collection subjacent to the craniotomy bone flap as above. 3. No other acute intracranial abnormality. CTA HEAD AND NECK: 1. Negative CTA of the head and neck. No large vessel occlusion or other emergent finding. 2. Atheromatous plaque about the right carotid bulb without hemodynamically significant stenosis. 3. Irregular pleural based right upper lobe mass, consistent with patient's known lung cancer. Scattered enlarged mediastinal, hilar, and right axillary lymph nodes, compatible with nodal metastatic disease. Findings better evaluated on concomitant CT of the chest. 4. Aortic Atherosclerosis (ICD10-I70.0) and Emphysema (ICD10-J43.9). Electronically Signed   By: Rise Mu M.D.   On: 12/12/2023 23:28   DG Pelvis Portable Result Date: 12/12/2023 CLINICAL DATA:  Lung cancer with metastasis to bone.  Left hip pain. EXAM: PORTABLE PELVIS 1-2 VIEWS COMPARISON:  PET CT 10/27/2023 FINDINGS: Patient's known left sacral lesion is radiographically occult, also CT occult on prior PET. No evidence of acute fracture. Both hips are normally located. Pubic symphysis and sacroiliac joints are congruent. IMPRESSION: 1. Patient's known left sacral lesion is radiographically occult. 2. No evidence of pathologic fracture. Electronically Signed   By: Narda Rutherford M.D.   On: 12/12/2023 22:06   DG Chest Portable 1 View Result Date: 12/12/2023 CLINICAL DATA:  History of lung cancer with Mets to bone. Left hip pain. EXAM: PORTABLE CHEST 1 VIEW COMPARISON:  Chest radiograph 10/01/2023. CT 09/17/2023, PET 10/27/2023 FINDINGS: Stable heart size and mediastinal contours. Aortic atherosclerosis. Chronic lung disease with subpleural reticulation, peripheral and basilar predominant. Nodular airspace disease in the right lung apex is faintly  visualized by radiograph. No evidence of focal bone lesion by radiograph. IMPRESSION: 1. No radiographic evidence of bone lesion. 2. Chronic lung disease. Nodule at the right lung apex is faintly visualized by radiograph. Electronically Signed   By: Narda Rutherford M.D.   On: 12/12/2023 22:05   CT Lumbar Spine Wo Contrast Result Date: 12/06/2023 CLINICAL DATA:  Low back pain EXAM: CT LUMBAR SPINE WITHOUT CONTRAST TECHNIQUE: Multidetector CT imaging of the lumbar spine was performed without intravenous contrast administration. Multiplanar CT image reconstructions were also generated. RADIATION DOSE REDUCTION: This exam was performed according to the departmental dose-optimization program which includes automated exposure control, adjustment of the mA and/or kV according to patient size and/or use of iterative reconstruction technique. COMPARISON:  CT 09/17/2023 FINDINGS: Segmentation: Partial sacralization of the L5 vertebral body. Alignment: Normal Vertebrae: No acute fracture or focal pathologic process. Paraspinal and other soft tissues: Negative. 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 Disc levels: Maintained IMPRESSION: No acute bony abnormality. Electronically  Signed   By: Charlett Nose M.D.   On: 12/06/2023 20:30   CT FEMUR LEFT WO CONTRAST Result Date: 12/06/2023 CLINICAL DATA:  Hip pain EXAM: CT OF THE LOWER LEFT EXTREMITY WITHOUT CONTRAST TECHNIQUE: Multidetector CT imaging of the lower left extremity was performed according to the standard protocol. RADIATION DOSE REDUCTION: This exam was performed according to the departmental dose-optimization program which includes automated exposure control, adjustment of the mA and/or kV according to patient size and/or use of iterative reconstruction technique. COMPARISON:  Plain films today. FINDINGS: Bones/Joint/Cartilage No acute bony abnormality. Specifically, no fracture, subluxation, or dislocation. No joint effusion. Ligaments Suboptimally assessed by CT.  Muscles and Tendons Negative Soft tissues Negative IMPRESSION: No acute bony abnormality. Electronically Signed   By: Charlett Nose M.D.   On: 12/06/2023 20:27   DG Hip Unilat With Pelvis 2-3 Views Left Result Date: 12/06/2023 CLINICAL DATA:  Hip pain EXAM: DG HIP (WITH OR WITHOUT PELVIS) 2-3V LEFT COMPARISON:  None Available. FINDINGS: There is no evidence of hip fracture or dislocation. There is no evidence of arthropathy or other focal bone abnormality. IMPRESSION: Negative. Electronically Signed   By: Darliss Cheney M.D.   On: 12/06/2023 18:39   DG Humerus Right Result Date: 11/27/2023 CLINICAL DATA:  Limited range of motion. Patient reports injury in December. EXAM: RIGHT HUMERUS - 2+ VIEW COMPARISON:  Radiograph 10/01/2023 FINDINGS: Technically limited due to overlapping soft tissues. Humeral intramedullary nail with proximal distal locking screws. Progressive bone destruction at the site of metastatic lesion in the mid humeral shaft. Lytic lesion currently spans 8.9 cm, previously 6.6 cm. No new fracture. Shoulder and elbow alignment are maintained. IMPRESSION: 1. Progressive bone destruction at the site of metastatic lesion in the mid humeral shaft. 2. Humeral intramedullary nail without hardware complication. Electronically Signed   By: Narda Rutherford M.D.   On: 11/27/2023 17:43   VAS [place] UPPER EXTREMITY VENOUS DUPLEX Result Date: 11/21/2023 UPPER VENOUS STUDY  Patient Name:  KAIULANI SITTON  Date of Exam:   11/21/2023 Medical Rec #: [id number]       Accession #:    [id number] Date of Birth: Mar 01, 1966       Patient Gender: F Patient Age:   31 years Exam Location:  [facility] Procedure:      VAS [place] UPPER EXTREMITY VENOUS DUPLEX Referring Phys: Lynden Oxford --------------------------------------------------------------------------------  Indications: Pain, and Edema Risk Factors: Recent femur fracture with surgical repair (10/10/2023), recent suture removal (11/15/2023). Comparison Study: No  previous exams Performing Technologist: Jody Hill RVT, RDMS  Examination Guidelines: A complete evaluation includes B-mode imaging, spectral Doppler, color Doppler, and power Doppler as needed of all accessible portions of each vessel. Bilateral testing is considered an integral part of a complete examination. Limited examinations for reoccurring indications may be performed as noted.  Right Findings: +----------+------------+---------+-----------+----------+--------------------+ RIGHT     CompressiblePhasicitySpontaneousProperties      Summary        +----------+------------+---------+-----------+----------+--------------------+ IJV           Full       No        Yes    pulsatile                      +----------+------------+---------+-----------+----------+--------------------+ Subclavian    Full       No        Yes    pulsatile                      +----------+------------+---------+-----------+----------+--------------------+  Axillary      Full       No        Yes    pulsatile                      +----------+------------+---------+-----------+----------+--------------------+ Brachial                 Yes       Yes                   patent by                                                              color/doppler     +----------+------------+---------+-----------+----------+--------------------+ Radial        Full                                                       +----------+------------+---------+-----------+----------+--------------------+ Ulnar                                                  Not visualized    +----------+------------+---------+-----------+----------+--------------------+ Cephalic      Full                                                       +----------+------------+---------+-----------+----------+--------------------+ Basilic                                                Not visualized     +----------+------------+---------+-----------+----------+--------------------+ Unable to visualized basilic or ulnar veins due to patient inability to move arm and edema. Limited visualization of cephalic and brachial due to immobility and edema, areas visualized appear patent.  Left Findings: +----+------------+---------+-----------+----------+-----------------------+ LEFTCompressiblePhasicitySpontaneousProperties        Summary         +----+------------+---------+-----------+----------+-----------------------+ IJV                Yes       Yes              patent by color/doppler +----+------------+---------+-----------+----------+-----------------------+  Summary:  Right: No evidence of deep vein thrombosis in the upper extremity in areas visualized. No evidence of superficial vein thrombosis in the upper extremity in areas visualized. However, unable to visualize the basilic and ulnar veins.  Left: No evidence of thrombosis in the subclavian.  *See table(s) above for measurements and observations.  Diagnosing physician: Gerarda Fraction Electronically signed by Gerarda Fraction on 11/21/2023 at 5:31:48 PM.    Final    DG Shoulder Right Result Date: 11/21/2023 CLINICAL DATA:  Right arm pain and swelling. EXAM: RIGHT HUMERUS - 2+ VIEW; RIGHT SHOULDER - 2+ VIEW COMPARISON:  None Available. FINDINGS: No  acute fracture or dislocation. Right humerus ORIF noted fixated with 2 threaded screw proximally and 1 threaded screw distally. The hardware is intact. No periprosthetic fracture or lucency. However, there is permeative pattern in the humerus in the middle third shaft and there are large areas of bone destruction surrounding the hardware. Glenohumeral and acromioclavicular joints are normal in alignment and exhibit minimal degenerative changes. No soft tissue swelling. No radiopaque foreign bodies. IMPRESSION: *No acute fracture or dislocation. *Permeative pattern in the humerus in the middle third shaft  and large areas of bone destruction surrounding the hardware. Findings are compatible with metastatic disease. Electronically Signed   By: Jules Schick M.D.   On: 11/21/2023 15:51   DG Humerus Right Result Date: 11/21/2023 CLINICAL DATA:  Right arm pain and swelling. EXAM: RIGHT HUMERUS - 2+ VIEW; RIGHT SHOULDER - 2+ VIEW COMPARISON:  None Available. FINDINGS: No acute fracture or dislocation. Right humerus ORIF noted fixated with 2 threaded screw proximally and 1 threaded screw distally. The hardware is intact. No periprosthetic fracture or lucency. However, there is permeative pattern in the humerus in the middle third shaft and there are large areas of bone destruction surrounding the hardware. Glenohumeral and acromioclavicular joints are normal in alignment and exhibit minimal degenerative changes. No soft tissue swelling. No radiopaque foreign bodies. IMPRESSION: *No acute fracture or dislocation. *Permeative pattern in the humerus in the middle third shaft and large areas of bone destruction surrounding the hardware. Findings are compatible with metastatic disease. Electronically Signed   By: Jules Schick M.D.   On: 11/21/2023 15:51     TODAY-DAY OF DISCHARGE:  Subjective:   Regina [name] today has no headache,no chest abdominal pain,no new weakness tingling or numbness, feels much better wants to go home today.   Objective:   Blood pressure (!) 145/75, pulse 98, temperature 98.3 F (36.8 C), temperature source Oral, resp. rate 20, height 5\' 6"  (1.676 m), weight 53.9 kg, last menstrual period 06/06/2010, SpO2 95%.  Intake/Output Summary (Last 24 hours) at 12/15/2023 0912 Last data filed at 12/14/2023 1200 Gross per 24 hour  Intake 240 ml  Output --  Net 240 ml   Filed Weights   12/13/23 0729 12/15/23 0500  Weight: 55.8 kg 53.9 kg    Exam: Awake Alert, Oriented *3, No new F.N deficits, Normal affect [place].AT,PERRAL Supple Neck,No JVD, No cervical lymphadenopathy appriciated.   Symmetrical Chest wall movement, Good air movement bilaterally, CTAB RRR,No Gallops,Rubs or new Murmurs, No Parasternal Heave +ve B.Sounds, Abd Soft, Non tender, No organomegaly appriciated, No rebound -guarding or rigidity. No Cyanosis, Clubbing or edema, No new Rash or bruise   PERTINENT RADIOLOGIC STUDIES: MR BRAIN W WO CONTRAST Result Date: 12/14/2023 CLINICAL DATA:  Metastatic disease evaluation. Stage IV non-small cell lung cancer treated with radiation in January. EXAM: MRI HEAD WITHOUT AND WITH CONTRAST TECHNIQUE: Multiplanar, multiecho pulse sequences of the brain and surrounding structures were obtained without and with intravenous contrast. CONTRAST:  5.87mL GADAVIST GADOBUTROL 1 MMOL/ML IV SOLN COMPARISON:  CT 2 days ago.  MRI 10/01/2023 FINDINGS: Brain: Diffusion imaging does not show any acute or subacute infarction or other cause of restricted diffusion. Interval left frontal craniotomy for mass resection. No focal abnormality seen affecting the brainstem or cerebellum. Right cerebral hemisphere shows chronic small-vessel ischemic changes of the white matter as seen previously. On the left, there is been previous frontal craniotomy for partial resection of the previously seen mass lesion. Previous measurements were 3.2 x 3.4 x 2.5 cm. Today,  the lesion measures 2.0 x 1.7 x 1.4 cm. Small resection cavity is present superior to the enhancing mass lesion. No second lesion is identified. The amount of regional vasogenic edema appears slightly reduced, with the slight decrease in mass effect and reduction in left to right shift from 1 cm to 4 mm. No hydrocephalus. No extra-axial collection. Vascular: Major vessels at the base of the brain show flow. Skull and upper cervical spine: Otherwise negative Sinuses/Orbits: Clear other than mild mucosal thickening of the left maxillary sinus. Orbits negative. Other: None IMPRESSION: Interval left frontal craniotomy for partial resection of the previously  seen mass lesion. Previous measurements were 3.2 x 3.4 x 2.5 cm. Today, the lesion measures 2.0 x 1.7 x 1.4 cm. Small resection cavity is present superior to the enhancing mass lesion. The amount of regional vasogenic edema appears slightly reduced, with the slight decrease in mass effect and reduction in left to right shift from 1 cm to 4 mm. No second lesion is identified. Electronically Signed   By: Paulina Fusi M.D.   On: 12/14/2023 15:06   EEG adult Result Date: 12/14/2023 Charlsie Quest, MD     12/14/2023 11:58 AM Patient Name: Marwah Disbro MRN: [id number] Epilepsy Attending: Charlsie Quest Referring Physician/Provider: Maretta Bees, MD Date: 12/14/2023 Duration: 22.28 mins Patient history: 58yo F with ams. EEG to evaluate for seizure Level of alertness: Awake AEDs during EEG study: None Technical aspects: This EEG study was done with scalp electrodes positioned according to the 10-20 International system of electrode placement. Electrical activity was reviewed with band pass filter of 1-70Hz , sensitivity of 7 uV/mm, display speed of 44mm/sec with a 60Hz  notched filter applied as appropriate. EEG data were recorded continuously and digitally stored.  Video monitoring was available and reviewed as appropriate. Description: The posterior dominant rhythm consists of 7.5 Hz activity of moderate voltage (25-35 uV) seen predominantly in posterior head regions, symmetric and reactive to eye opening and eye closing. EEG showed continuous 3 to 6 Hz theta-delta slowing in left hemisphere. There is also 3-5Hz  sharply contoured theta-delta slowing in left frontal region consistent with breach artifact. Hyperventilation and photic stimulation were not performed.   ABNORMALITY - Breach artifact, left frontal region - Continuous slow, left hemisphere IMPRESSION: This study is suggestive of cortical dysfunction in left frontal region consistent with underlying craniotomy. Additionally there is cortical dysfunction  arising from left hemisphere likely secondary to underlying structural abnormality. No seizures or definite epileptiform discharges were seen throughout the recording. Priyanka Annabelle Harman     PERTINENT LAB RESULTS: CBC: Recent Labs    12/13/23 0523 12/14/23 0428  WBC 10.8* 8.1  HGB 14.0 13.8  HCT 42.5 40.2  PLT 520* 375   CMET CMP     Component Value Date/Time   NA 132 (L) 12/14/2023 0428   NA 130 (L) 10/19/2017 0942   K 4.9 12/14/2023 0428   CL 98 12/14/2023 0428   CO2 20 (L) 12/14/2023 0428   GLUCOSE 138 (H) 12/14/2023 0428   BUN <5 (L) 12/14/2023 0428   BUN 4 (L) 10/19/2017 0942   CREATININE 0.49 12/14/2023 0428   CREATININE 0.51 11/02/2023 0845   CALCIUM 8.6 (L) 12/14/2023 0428   PROT 7.7 12/13/2023 0523   PROT 7.6 10/19/2017 0942   ALBUMIN 3.7 12/13/2023 0523   ALBUMIN 4.6 10/19/2017 0942   AST 30 12/13/2023 0523   AST 16 11/02/2023 0845   ALT 14 12/13/2023 0523   ALT 13 11/02/2023 0845  ALKPHOS 76 12/13/2023 0523   BILITOT 1.0 12/13/2023 0523   BILITOT 0.3 11/02/2023 0845   GFRNONAA >60 12/14/2023 0428   GFRNONAA >60 11/02/2023 0845    GFR Estimated Creatinine Clearance: 63.6 mL/min (by C-G formula based on SCr of 0.49 mg/dL). No results for input(s): "LIPASE", "AMYLASE" in the last 72 hours. No results for input(s): "CKTOTAL", "CKMB", "CKMBINDEX", "TROPONINI" in the last 72 hours. Invalid input(s): "POCBNP" Recent Labs    12/12/23 1747  DDIMER >20.00*   No results for input(s): "HGBA1C" in the last 72 hours. No results for input(s): "CHOL", "HDL", "LDLCALC", "TRIG", "CHOLHDL", "LDLDIRECT" in the last 72 hours. Recent Labs    12/15/23 0106  TSH 1.086   No results for input(s): "VITAMINB12", "FOLATE", "FERRITIN", "TIBC", "IRON", "RETICCTPCT" in the last 72 hours. Coags: No results for input(s): "INR" in the last 72 hours.  Invalid input(s): "PT" Microbiology: Recent Results (from the past 240 hours)  Resp panel by RT-PCR (RSV, Flu A&B, Covid)  Urine, Catheterized     Status: Abnormal   Collection Time: 12/12/23  5:52 PM   Specimen: Urine, Catheterized; Nasal Swab  Result Value Ref Range Status   SARS Coronavirus 2 by RT PCR POSITIVE (A) NEGATIVE Final   Influenza A by PCR NEGATIVE NEGATIVE Final   Influenza B by PCR NEGATIVE NEGATIVE Final    Comment: (NOTE) The Xpert Xpress SARS-CoV-2/FLU/RSV plus assay is intended as an aid in the diagnosis of influenza from Nasopharyngeal swab specimens and should not be used as a sole basis for treatment. Nasal washings and aspirates are unacceptable for Xpert Xpress SARS-CoV-2/FLU/RSV testing.  Fact Sheet for Patients: BloggerCourse.com  Fact Sheet for Healthcare Providers: SeriousBroker.it  This test is not yet approved or cleared by the [place] FDA and has been authorized for detection and/or diagnosis of SARS-CoV-2 by FDA under an Emergency Use Authorization (EUA). This EUA will remain in effect (meaning this test can be used) for the duration of the COVID-19 declaration under Section 564(b)(1) of the Act, 21 U.S.C. section 360bbb-3(b)(1), unless the authorization is terminated or revoked.     Resp Syncytial Virus by PCR NEGATIVE NEGATIVE Final    Comment: (NOTE) Fact Sheet for Patients: BloggerCourse.com  Fact Sheet for Healthcare Providers: SeriousBroker.it  This test is not yet approved or cleared by the [place] FDA and has been authorized for detection and/or diagnosis of SARS-CoV-2 by FDA under an Emergency Use Authorization (EUA). This EUA will remain in effect (meaning this test can be used) for the duration of the COVID-19 declaration under Section 564(b)(1) of the Act, 21 U.S.C. section 360bbb-3(b)(1), unless the authorization is terminated or revoked.  Performed at [facility], [address], [place], [place] [address]   Culture, blood  (single) w Reflex to ID Panel     Status: None (Preliminary result)   Collection Time: 12/13/23  8:00 AM   Specimen: BLOOD LEFT HAND  Result Value Ref Range Status   Specimen Description BLOOD LEFT HAND  Final   Special Requests   Final    BOTTLES DRAWN AEROBIC AND ANAEROBIC Blood Culture results may not be optimal due to an inadequate volume of blood received in culture bottles   Culture   Final    NO GROWTH 2 DAYS Performed at [facility], [address] [place], [place] [address]    Report Status PENDING  Incomplete    FURTHER DISCHARGE INSTRUCTIONS:  Get Medicines reviewed and adjusted: Please take all your medications  with you for your next visit with your Primary MD  Laboratory/radiological data: Please request your Primary MD to go over all hospital tests and procedure/radiological results at the follow up, please ask your Primary MD to get all Hospital records sent to his/her office.  In some cases, they will be blood work, cultures and biopsy results pending at the time of your discharge. Please request that your primary care M.D. goes through all the records of your hospital data and follows up on these results.  Also Note the following: If you experience worsening of your admission symptoms, develop shortness of breath, life threatening emergency, suicidal or homicidal thoughts you must seek medical attention immediately by calling 911 or calling your MD immediately  if symptoms less severe.  You must read complete instructions/literature along with all the possible adverse reactions/side effects for all the Medicines you take and that have been prescribed to you. Take any new Medicines after you have completely understood and accpet all the possible adverse reactions/side effects.   Do not drive when taking Pain medications or sleeping medications (Benzodaizepines)  Do not take more than prescribed Pain, Sleep and Anxiety Medications. It is not advisable to combine  anxiety,sleep and pain medications without talking with your primary care practitioner  Special Instructions: If you have smoked or chewed Tobacco  in the last 2 yrs please stop smoking, stop any regular Alcohol  and or any Recreational drug use.  Wear Seat belts while driving.  Please note: You were cared for by a hospitalist during your hospital stay. Once you are discharged, your primary care physician will handle any further medical issues. Please note that NO REFILLS for any discharge medications will be authorized once you are discharged, as it is imperative that you return to your primary care physician (or establish a relationship with a primary care physician if you do not have one) for your post hospital discharge needs so that they can reassess your need for medications and monitor your lab values.  Total Time spent coordinating discharge including counseling, education and face to face time equals greater than 30 minutes.  Signed: Lottie Siska 12/15/2023 9:12 AM

## 2023-12-15 NOTE — TOC Transition Note (Signed)
 Transition of Care [facility]) - Discharge Note   Patient Details  Name: Regina [name] MRN: [id number] Date of Birth: 1965-11-08  [facility] CM/SW Contact:  Gordy Clement, RN Phone Number: 12/15/2023, 9:19 AM   Clinical Narrative:    Patient will DC to home today There are no recommendations for [facility]. Patient was already established for OP PT r/t UE and will continue. No DME recommended. Son will transport   No TOC needs at this time          Patient Goals and CMS Choice            Discharge Placement                       Discharge Plan and Services Additional resources added to the After Visit Summary for                                       Social Drivers of Health (SDOH) Interventions SDOH Screenings   Food Insecurity: No Food Insecurity (12/13/2023)  Housing: Low Risk  (12/13/2023)  Transportation Needs: No Transportation Needs (12/13/2023)  Utilities: At Risk (12/13/2023)  Depression (PHQ2-9): Low Risk  (11/07/2023)  Tobacco Use: Medium Risk (12/13/2023)     Readmission Risk Interventions    11/24/2023    4:23 PM 10/05/2023    9:51 AM 09/20/2023    2:40 PM  Readmission Risk Prevention Plan  Medication Screening   Complete  Transportation Screening Complete Complete Complete  HRI or Home Care Consult  Complete   Social Work Consult for Recovery Care Planning/Counseling  Complete   Palliative Care Screening  Complete   Medication Review Oceanographer) Complete Complete   PCP or Specialist appointment within 3-5 days of discharge Complete    HRI or Home Care Consult Complete    SW Recovery Care/Counseling Consult Complete    Palliative Care Screening Complete    Skilled Nursing Facility Not Applicable

## 2023-12-16 ENCOUNTER — Telehealth: Payer: Self-pay

## 2023-12-16 ENCOUNTER — Telehealth: Payer: Self-pay | Admitting: Radiation Therapy

## 2023-12-16 NOTE — Telephone Encounter (Signed)
 I returned a call from Regina [name], requesting follow-up with Rad Onc after her recent hospital admission. I shared that we will be reviewing her recent brain MRI during our upcoming brain and spine conference on Monday, 4/14. And that I will call her afterwards with the recommendations from the team. She is happy with this plan.   Jalene Mullet R.T.(R)(T) Radiation Special Procedures Lead

## 2023-12-16 NOTE — Transitions of Care (Post Inpatient/ED Visit) (Signed)
 12/16/2023  Name: Regina [name] MRN: [id number] DOB: May 09, 1966  Today's TOC FU Call Status: Today's TOC FU Call Status:: Successful TOC FU Call Completed TOC FU Call Complete Date: 12/16/23 Patient's Name and Date of Birth confirmed.  Transition Care Management Follow-up Telephone Call Date of Discharge: 12/15/23 Discharge Facility: [facility]) Type of Discharge: Inpatient Admission Primary Inpatient Discharge Diagnosis:: Acute metabolic encephalopathy How have you been since you were released from the hospital?: Same Any questions or concerns?: No (Patient denies)  Items Reviewed: Did you receive and understand the discharge instructions provided?: Yes Medications obtained,verified, and reconciled?: Yes (Medications Reviewed) Any new allergies since your discharge?: No Dietary orders reviewed?: Yes Type of Diet Ordered:: low sodium heart healthy Do you have support at home?: Yes People in Home [RPT]: sibling(s) Name of Support/Comfort Primary Source: Brother & son lives in home  Medications Reviewed Today: Medications Reviewed Today   Medications were not reviewed in this encounter     Home Care and Equipment/Supplies: Were Home Health Services Ordered?: No Any new equipment or medical supplies ordered?: No  Functional Questionnaire: Do you need assistance with bathing/showering or dressing?: No Do you need assistance with meal preparation?: No Do you need assistance with eating?: No Do you have difficulty maintaining continence: No Do you need assistance with getting out of bed/getting out of a chair/moving?: No Do you have difficulty managing or taking your medications?: No  Follow up appointments reviewed: PCP Follow-up appointment confirmed?: Yes (Appointment scheduled via careguides) Date of PCP follow-up appointment?: 12/27/23 Follow-up Provider: Gwinda Passe Specialist [facility] Follow-up appointment confirmed?: Yes Date of Specialist follow-up  appointment?: 12/27/23 (office is going to call patient back with appointment) Follow-Up Specialty Provider:: Radiation Oncology Lonie Peak or Dr Kathrynn Running Do you need transportation to your follow-up appointment?: No (patient states granddaughter will take her to appointments) Do you understand care options if your condition(s) worsen?: Yes-patient verbalized understanding    Goals Addressed             This Visit's Progress    COMPLETED: TOC 30 day program       Current Barriers:  Knowledge Deficits related to plan of care for management of Brain Mass   RNCM Clinical Goal(s):  Patient will work with the Care Management team over the next 30 days to address Transition of Care Barriers: Provider appointments take all medications exactly as prescribed and will call provider for medication related questions as evidenced by EMR attend all scheduled medical appointments: Specialist and PCP as evidenced by EMR  through collaboration with RN Care manager, provider, and care team.   Interventions: Evaluation of current treatment plan related to  self management and patient's adherence to plan as established by provider  Transitions of Care:  New goal. Doctor Visits  - discussed the importance of doctor visits Arranged PCP follow-up within 12-14 days (Care Guide Scheduled)  Patient Goals/Self-Care Activities: Participate in Transition of Care Program/Attend [facility] scheduled calls Notify RN Care Manager of [facility] call rescheduling needs Take all medications as prescribed Attend all scheduled provider appointments Call pharmacy for medication refills 3-7 days in advance of running out of medications  Follow Up Plan:  Telephone follow up appointment with care management team member scheduled for:  Wyline Mood 09/29/2023 1:45 The patient has been provided with contact information for the care management team and has been advised to call with any health related questions or concerns.  Next PCP  appointment scheduled for:  10/04/2023 Gwinda Passe  VBCI Transitions of Care (TOC) Care Plan       Problems:  Recent Hospitalization for treatment of Acute metabolic encephalopathy Unable to complete full Initial Assessment 12/16/23 - Patient abruptly stopped talking and TOC RN could not get patient to respond, Patient had told TOC RN she was home alone and after repeated attempts to get patient to respond, TOC RN requested Supervisor to call 911- After 911 was called and on their way, Patient came back on the line and said she switched over to take another call and had done so without making TOC RN aware- Patient voiced that she was upset that [facility] RN had someone call 911 and did not want to complete initial assessment/medication review today - patient requested call back another day  Goal:  Over the next 30 days, the patient will not experience hospital readmission  Interventions:  Transitions of Care: Doctor Visits  - discussed the importance of doctor visits Arranged PCP follow-up within 12-14 days (Care Guide Scheduled)  Oncology: Assessment of understanding of oncology diagnosis:  Reviewed upcoming provider appointments and treatment appointments Assessed available transportation to appointments and treatments. Has consistent/reliable transportation: Yes  Patient Self Care Activities:  Attend all scheduled provider appointments Call pharmacy for medication refills 3-7 days in advance of running out of medications Call provider office for new concerns or questions  Notify RN Care Manager of TOC call rescheduling needs Participate in Transition of Care Program/Attend TOC scheduled calls Take medications as prescribed    Plan:  Next PCP & Radiation Oncology appointments scheduled for: 12/27/23 Telephone follow up appointment with care management team member: patient ended call on 12/16/23 and requested a call another day  The patient has been provided with contact information  for the care management team and has been advised to call with any health related questions or concerns.          Hilbert Odor RN, CCM [place]  VBCI-Population Health RN Care Manager [phone]

## 2023-12-18 ENCOUNTER — Encounter (HOSPITAL_COMMUNITY): Payer: Self-pay

## 2023-12-18 ENCOUNTER — Inpatient Hospital Stay (HOSPITAL_COMMUNITY)
Admission: EM | Admit: 2023-12-18 | Discharge: 2023-12-27 | DRG: 542 | Disposition: A | Discharge status: Home / Self Care | Payer: MEDICAID | Attending: Internal Medicine | Admitting: Internal Medicine

## 2023-12-18 ENCOUNTER — Other Ambulatory Visit: Payer: Self-pay

## 2023-12-18 DIAGNOSIS — Z818 Family history of other mental and behavioral disorders: Secondary | ICD-10-CM

## 2023-12-18 DIAGNOSIS — R64 Cachexia: Secondary | ICD-10-CM | POA: Diagnosis present

## 2023-12-18 DIAGNOSIS — C7951 Secondary malignant neoplasm of bone: Principal | ICD-10-CM

## 2023-12-18 DIAGNOSIS — G893 Neoplasm related pain (acute) (chronic): Principal | ICD-10-CM | POA: Diagnosis present

## 2023-12-18 DIAGNOSIS — K219 Gastro-esophageal reflux disease without esophagitis: Secondary | ICD-10-CM | POA: Diagnosis present

## 2023-12-18 DIAGNOSIS — Z7952 Long term (current) use of systemic steroids: Secondary | ICD-10-CM

## 2023-12-18 DIAGNOSIS — J439 Emphysema, unspecified: Secondary | ICD-10-CM | POA: Diagnosis present

## 2023-12-18 DIAGNOSIS — G936 Cerebral edema: Secondary | ICD-10-CM | POA: Diagnosis present

## 2023-12-18 DIAGNOSIS — C3490 Malignant neoplasm of unspecified part of unspecified bronchus or lung: Secondary | ICD-10-CM

## 2023-12-18 DIAGNOSIS — Z681 Body mass index (BMI) 19 or less, adult: Secondary | ICD-10-CM

## 2023-12-18 DIAGNOSIS — R54 Age-related physical debility: Secondary | ICD-10-CM | POA: Diagnosis present

## 2023-12-18 DIAGNOSIS — Z91030 Bee allergy status: Secondary | ICD-10-CM

## 2023-12-18 DIAGNOSIS — M25552 Pain in left hip: Secondary | ICD-10-CM | POA: Diagnosis present

## 2023-12-18 DIAGNOSIS — G40909 Epilepsy, unspecified, not intractable, without status epilepticus: Secondary | ICD-10-CM | POA: Diagnosis present

## 2023-12-18 DIAGNOSIS — C7931 Secondary malignant neoplasm of brain: Secondary | ICD-10-CM | POA: Diagnosis present

## 2023-12-18 DIAGNOSIS — Z79899 Other long term (current) drug therapy: Secondary | ICD-10-CM

## 2023-12-18 DIAGNOSIS — F32A Depression, unspecified: Secondary | ICD-10-CM | POA: Diagnosis present

## 2023-12-18 DIAGNOSIS — Z923 Personal history of irradiation: Secondary | ICD-10-CM

## 2023-12-18 DIAGNOSIS — Z8249 Family history of ischemic heart disease and other diseases of the circulatory system: Secondary | ICD-10-CM

## 2023-12-18 DIAGNOSIS — F419 Anxiety disorder, unspecified: Secondary | ICD-10-CM | POA: Diagnosis present

## 2023-12-18 DIAGNOSIS — C779 Secondary and unspecified malignant neoplasm of lymph node, unspecified: Secondary | ICD-10-CM | POA: Diagnosis present

## 2023-12-18 DIAGNOSIS — I10 Essential (primary) hypertension: Secondary | ICD-10-CM | POA: Diagnosis present

## 2023-12-18 DIAGNOSIS — Z87891 Personal history of nicotine dependence: Secondary | ICD-10-CM

## 2023-12-18 DIAGNOSIS — Z91014 Allergy to mammalian meats: Secondary | ICD-10-CM

## 2023-12-18 LAB — CBC
HCT: 43.8 % (ref 36.0–46.0)
Hemoglobin: 14.7 g/dL (ref 12.0–15.0)
MCH: 28.3 pg (ref 26.0–34.0)
MCHC: 33.6 g/dL (ref 30.0–36.0)
MCV: 84.2 fL (ref 80.0–100.0)
Platelets: 524 10*3/uL — ABNORMAL HIGH (ref 150–400)
RBC: 5.2 MIL/uL — ABNORMAL HIGH (ref 3.87–5.11)
RDW: 15.4 % (ref 11.5–15.5)
WBC: 15 10*3/uL — ABNORMAL HIGH (ref 4.0–10.5)
nRBC: 0 % (ref 0.0–0.2)

## 2023-12-18 LAB — COMPREHENSIVE METABOLIC PANEL WITH GFR
ALT: 17 U/L (ref 0–44)
AST: 26 U/L (ref 15–41)
Albumin: 3.8 g/dL (ref 3.5–5.0)
Alkaline Phosphatase: 59 U/L (ref 38–126)
Anion gap: 14 (ref 5–15)
BUN: 8 mg/dL (ref 6–20)
CO2: 21 mmol/L — ABNORMAL LOW (ref 22–32)
Calcium: 9.4 mg/dL (ref 8.9–10.3)
Chloride: 97 mmol/L — ABNORMAL LOW (ref 98–111)
Creatinine, Ser: 0.73 mg/dL (ref 0.44–1.00)
GFR, Estimated: >60 mL/min (ref >60)
Glucose, Bld: 100 mg/dL — ABNORMAL HIGH (ref 70–99)
Potassium: 3 mmol/L — ABNORMAL LOW (ref 3.5–5.1)
Sodium: 132 mmol/L — ABNORMAL LOW (ref 135–145)
Total Bilirubin: 0.4 mg/dL (ref 0.0–1.2)
Total Protein: 7.2 g/dL (ref 6.5–8.1)

## 2023-12-18 LAB — CULTURE, BLOOD (SINGLE): Culture: NO GROWTH

## 2023-12-18 MED ORDER — OXYCODONE-ACETAMINOPHEN 5-325 MG PO TABS
1.0000 | ORAL_TABLET | Freq: Four times a day (QID) | ORAL | Status: DC | PRN
  Administered 2023-12-18: 1 via ORAL
  Filled 2023-12-18: qty 1

## 2023-12-18 MED ORDER — HYDROMORPHONE HCL 1 MG/ML IJ SOLN
1.0000 mg | Freq: Once | INTRAMUSCULAR | Status: AC
  Administered 2023-12-18: 1 mg via INTRAVENOUS
  Filled 2023-12-18: qty 1

## 2023-12-18 MED ORDER — POTASSIUM CHLORIDE CRYS ER 20 MEQ PO TBCR
40.0000 meq | EXTENDED_RELEASE_TABLET | Freq: Once | ORAL | Status: AC
  Administered 2023-12-18: 40 meq via ORAL
  Filled 2023-12-18: qty 2

## 2023-12-18 MED ORDER — HYDROMORPHONE HCL 1 MG/ML IJ SOLN: 1.0000 mg | Freq: Once | INTRAMUSCULAR | Status: DC

## 2023-12-18 MED ORDER — POTASSIUM CHLORIDE CRYS ER 20 MEQ PO TBCR: 40.0000 meq | EXTENDED_RELEASE_TABLET | Freq: Once | ORAL | Status: DC

## 2023-12-18 MED ORDER — HYDROMORPHONE HCL 1 MG/ML IJ SOLN
0.5000 mg | INTRAMUSCULAR | Status: DC | PRN
  Administered 2023-12-19: 0.5 mg via INTRAVENOUS
  Filled 2023-12-18: qty 1

## 2023-12-18 MED ORDER — SODIUM CHLORIDE 0.9 % IV BOLUS
1000.0000 mL | Freq: Once | INTRAVENOUS | Status: AC
  Administered 2023-12-18: 1000 mL via INTRAVENOUS

## 2023-12-18 NOTE — ED Provider Notes (Signed)
 [place] EMERGENCY DEPARTMENT AT [facility] Provider Note   CSN: [id number] Arrival date & time: 12/18/23  1713     History  Chief Complaint  Patient presents with   Leg Pain    Regina [name] is a 58 y.o. 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  She was just discharged 2 to 3 days ago.  She denies any new fall, injury.  She comes in reporting 10/10 pain in legs, arm.  She denies any chest pain, abdominal pain, nausea, vomiting.   Leg Pain      Home Medications Prior to Admission medications   Medication Sig Start Date End Date Taking? Authorizing Provider  cyclobenzaprine (FLEXERIL) 10 MG tablet Take 1 tablet (10 mg total) by mouth at bedtime for 10 days. 12/10/23 12/20/23 Yes Zelaya, Oscar A, PA-C  dexamethasone (DECADRON) 2 MG tablet Take 2 tablets (4 mg total) by mouth every 6 (six) hours for 3 days, THEN 1.5 tablets (3 mg total) every 8 (eight) hours for 5 days, THEN 1 tablet (2 mg total) every 12 (twelve) hours and stay on this till seen by medical oncology/radiation oncology. 12/15/23 01/14/24 Yes Ghimire, Estil Heman, MD  diclofenac Sodium (VOLTAREN) 1 % GEL Apply 2 g topically 4 (four) times daily. 12/10/23  Yes Zelaya, Oscar A, PA-C  levETIRAcetam (KEPPRA) 500 MG tablet Take 1 tablet (500 mg total) by mouth 2 (two) times daily. 12/15/23  Yes Ghimire, Estil Heman, MD  metoprolol tartrate (LOPRESSOR) 25 MG tablet Take 1 tablet (25 mg total) by mouth 2 (two) times daily. 12/15/23  Yes Ghimire, Estil Heman, MD  pantoprazole (PROTONIX) 40 MG tablet Take 1 tablet (40 mg total) by mouth daily. 12/15/23 12/14/24 Yes Ghimire, Estil Heman, MD      Allergies    Pork allergy and Bee venom    Review of Systems   Review of Systems  All other systems reviewed and are  negative.   Physical Exam Updated Vital Signs BP (!) 160/96 (BP Location: Left Arm)   Pulse 92   Temp 98.1 F (36.7 C) (Oral)   Resp 18   Ht 5\' 6"  (1.676 m)   Wt 53.9 kg   LMP 06/06/2010   SpO2 100%   BMI 19.18 kg/m  Physical Exam Vitals and nursing note reviewed.  Constitutional:      General: She is not in acute distress.    Appearance: Normal appearance. She is ill-appearing.  HENT:     Head: Normocephalic and atraumatic.  Eyes:     General:        Right eye: No discharge.        Left eye: No discharge.  Cardiovascular:     Rate and Rhythm: Normal rate and regular rhythm.     Heart sounds: No murmur heard.    No friction rub. No gallop.  Pulmonary:     Effort: Pulmonary effort is normal.     Breath sounds: Normal breath sounds.  Abdominal:     General: Bowel sounds are normal.     Palpations: Abdomen is soft.  Musculoskeletal:     Comments: Right upper extremity in sling, no new signs of injury, hematoma, soft tissue swelling, but significant tenderness to palpation throughout area of known fracture.  Skin:    General: Skin is warm and dry.  Capillary Refill: Capillary refill takes less than 2 seconds.  Neurological:     Mental Status: She is alert and oriented to person, place, and time.  Psychiatric:        Mood and Affect: Mood normal.        Behavior: Behavior normal.     ED Results / Procedures / Treatments   Labs (all labs ordered are listed, but only abnormal results are displayed) Labs Reviewed  CBC - Abnormal; Notable for the following components:      Result Value   WBC 15.0 (*)    RBC 5.20 (*)    Platelets 524 (*)    All other components within normal limits  COMPREHENSIVE METABOLIC PANEL WITH GFR - Abnormal; Notable for the following components:   Sodium 132 (*)    Potassium 3.0 (*)    Chloride 97 (*)    CO2 21 (*)    Glucose, Bld 100 (*)    All other components within normal limits  URINALYSIS, ROUTINE W REFLEX MICROSCOPIC     EKG None  Radiology No results found.  Procedures Procedures    Medications Ordered in ED Medications  oxyCODONE-acetaminophen (PERCOCET/ROXICET) 5-325 MG per tablet 1 tablet (1 tablet Oral Given 12/18/23 2344)  HYDROmorphone (DILAUDID) injection 0.5 mg (has no administration in time range)  HYDROmorphone (DILAUDID) injection 1 mg (1 mg Intravenous Given 12/18/23 2116)  sodium chloride 0.9 % bolus 1,000 mL (1,000 mLs Intravenous New Bag/Given 12/18/23 2118)  potassium chloride SA (KLOR-CON M) CR tablet 40 mEq (40 mEq Oral Given 12/18/23 2118)    ED Course/ Medical Decision Making/ A&P Clinical Course as of 12/18/23 2350  Sun Dec 18, 2023  2204 Spoke with Dr. Segars about admission, he reports that we will likely try to trial pain control instead of admitting, but will assess when he is able [CP]    Clinical Course User Index [CP] Nelly Banco, PA-C                                 Medical Decision Making Amount and/or Complexity of Data Reviewed Labs: ordered.  Risk Prescription drug management.   This patient is a 58 y.o. female  who presents to the ED for concern of severe pain throughout body, inability to care for herself.   Differential diagnoses prior to evaluation: The emergent differential diagnosis includes, but is not limited to, complication related to her known metastatic disease versus poorly controlled pain related to her known metastatic disease. This is not an exhaustive differential.   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 Pertinent results include: Extensively reviewed her recent admission for vasogenic edema secondary to intracranial metastases with discharged just 4 days ago.  Physical Exam: Physical exam performed. The pertinent findings include: Patient  is very ill-appearing at baseline, she arrives tachycardic, pulse 123, hypertensive, blood pressure 187/113.  She is in a sling on the right.  She denies any new fall.  She is focally tender over the area of her known fracture.  She is otherwise afebrile, stable vital signs, tachycardia resolved after pain controlled.  Lab Tests/Imaging studies: I personally interpreted labs/imaging and the pertinent results include: CBC notable for leukocytosis, platelets elevated as well at 524, all blood counts are elevated from just a few days ago  suggesting possible hemoconcentration, she does endorse some poor p.o. intake.  Her CMP is notable for hyponatremia, sodium 132, hypokalemia, potassium 3.0.  Medications: I ordered medication including Dilaudid for pain, fluids for suspected dehydration, potassium 4 hypokalemia.  I have reviewed the patients home medicines and have made adjustments as needed.   Consults: This patient was requiring significant pain medicine in the ED and has significant chronic disease with barriers to care at home with no one to help take care of her I spoke to the hospitalist about admission for pain control for her diffuse metastatic disease, spoke with Dr. Amy Kansky who reports that he will plan to assess patient at bedside and see if it is possible to discharge her if she can tolerate transitioning to oral pain medicine.  Final Clinical Impression(s) / ED Diagnoses Final diagnoses:  None    Rx / DC Orders ED Discharge Orders     None         Stefan Edge 12/18/23 2350    Hershel Los, MD 12/19/23 1626

## 2023-12-18 NOTE — ED Triage Notes (Signed)
 Patient comes in for left leg pain and right arm swelling. Patient is yelling in triage.

## 2023-12-19 ENCOUNTER — Encounter (HOSPITAL_COMMUNITY): Payer: Self-pay | Admitting: Internal Medicine

## 2023-12-19 ENCOUNTER — Encounter: Payer: Self-pay | Admitting: Internal Medicine

## 2023-12-19 ENCOUNTER — Emergency Department (HOSPITAL_COMMUNITY): Payer: MEDICAID

## 2023-12-19 ENCOUNTER — Other Ambulatory Visit: Payer: Self-pay | Admitting: Radiation Therapy

## 2023-12-19 DIAGNOSIS — Z79899 Other long term (current) drug therapy: Secondary | ICD-10-CM | POA: Diagnosis not present

## 2023-12-19 DIAGNOSIS — Z7952 Long term (current) use of systemic steroids: Secondary | ICD-10-CM | POA: Diagnosis not present

## 2023-12-19 DIAGNOSIS — F32A Depression, unspecified: Secondary | ICD-10-CM | POA: Diagnosis present

## 2023-12-19 DIAGNOSIS — J439 Emphysema, unspecified: Secondary | ICD-10-CM | POA: Diagnosis present

## 2023-12-19 DIAGNOSIS — Z8249 Family history of ischemic heart disease and other diseases of the circulatory system: Secondary | ICD-10-CM | POA: Diagnosis not present

## 2023-12-19 DIAGNOSIS — Z7189 Other specified counseling: Secondary | ICD-10-CM

## 2023-12-19 DIAGNOSIS — G893 Neoplasm related pain (acute) (chronic): Secondary | ICD-10-CM | POA: Diagnosis present

## 2023-12-19 DIAGNOSIS — F419 Anxiety disorder, unspecified: Secondary | ICD-10-CM | POA: Diagnosis present

## 2023-12-19 DIAGNOSIS — C7931 Secondary malignant neoplasm of brain: Secondary | ICD-10-CM | POA: Diagnosis present

## 2023-12-19 DIAGNOSIS — Z87891 Personal history of nicotine dependence: Secondary | ICD-10-CM | POA: Diagnosis not present

## 2023-12-19 DIAGNOSIS — C779 Secondary and unspecified malignant neoplasm of lymph node, unspecified: Secondary | ICD-10-CM | POA: Diagnosis present

## 2023-12-19 DIAGNOSIS — K219 Gastro-esophageal reflux disease without esophagitis: Secondary | ICD-10-CM | POA: Diagnosis present

## 2023-12-19 DIAGNOSIS — C7951 Secondary malignant neoplasm of bone: Secondary | ICD-10-CM

## 2023-12-19 DIAGNOSIS — Z681 Body mass index (BMI) 19 or less, adult: Secondary | ICD-10-CM | POA: Diagnosis not present

## 2023-12-19 DIAGNOSIS — C3411 Malignant neoplasm of upper lobe, right bronchus or lung: Secondary | ICD-10-CM | POA: Diagnosis not present

## 2023-12-19 DIAGNOSIS — Z91014 Allergy to mammalian meats: Secondary | ICD-10-CM | POA: Diagnosis not present

## 2023-12-19 DIAGNOSIS — R54 Age-related physical debility: Secondary | ICD-10-CM | POA: Diagnosis present

## 2023-12-19 DIAGNOSIS — C3490 Malignant neoplasm of unspecified part of unspecified bronchus or lung: Secondary | ICD-10-CM

## 2023-12-19 DIAGNOSIS — G936 Cerebral edema: Secondary | ICD-10-CM | POA: Diagnosis present

## 2023-12-19 DIAGNOSIS — I10 Essential (primary) hypertension: Secondary | ICD-10-CM | POA: Diagnosis present

## 2023-12-19 DIAGNOSIS — Z923 Personal history of irradiation: Secondary | ICD-10-CM | POA: Diagnosis not present

## 2023-12-19 DIAGNOSIS — M25552 Pain in left hip: Secondary | ICD-10-CM | POA: Diagnosis present

## 2023-12-19 DIAGNOSIS — R64 Cachexia: Secondary | ICD-10-CM | POA: Diagnosis present

## 2023-12-19 DIAGNOSIS — Z91030 Bee allergy status: Secondary | ICD-10-CM | POA: Diagnosis not present

## 2023-12-19 DIAGNOSIS — Z818 Family history of other mental and behavioral disorders: Secondary | ICD-10-CM | POA: Diagnosis not present

## 2023-12-19 DIAGNOSIS — Z515 Encounter for palliative care: Secondary | ICD-10-CM | POA: Diagnosis not present

## 2023-12-19 DIAGNOSIS — G40909 Epilepsy, unspecified, not intractable, without status epilepticus: Secondary | ICD-10-CM | POA: Diagnosis present

## 2023-12-19 LAB — URINALYSIS, ROUTINE W REFLEX MICROSCOPIC
Bacteria, UA: NONE SEEN
Bilirubin Urine: NEGATIVE
Glucose, UA: NEGATIVE mg/dL
Hgb urine dipstick: NEGATIVE
Ketones, ur: NEGATIVE mg/dL
Nitrite: NEGATIVE
Protein, ur: NEGATIVE mg/dL
Specific Gravity, Urine: 1.008 (ref 1.005–1.030)
pH: 7 (ref 5.0–8.0)

## 2023-12-19 MED ORDER — ONDANSETRON HCL 4 MG PO TABS
4.0000 mg | ORAL_TABLET | Freq: Four times a day (QID) | ORAL | Status: DC | PRN
Start: 2023-12-19 — End: 2023-12-27

## 2023-12-19 MED ORDER — OXYCODONE HCL 5 MG PO TABS: 5.0000 mg | ORAL_TABLET | ORAL | Status: DC | PRN

## 2023-12-19 MED ORDER — CYCLOBENZAPRINE HCL 10 MG PO TABS
10.0000 mg | ORAL_TABLET | Freq: Every day | ORAL | Status: DC
  Administered 2023-12-19 – 2023-12-26 (×8): 10 mg via ORAL
  Filled 2023-12-19 (×8): qty 1

## 2023-12-19 MED ORDER — TRAZODONE HCL 50 MG PO TABS
50.0000 mg | ORAL_TABLET | Freq: Every evening | ORAL | Status: DC | PRN
  Administered 2023-12-23 – 2023-12-26 (×4): 50 mg via ORAL
  Filled 2023-12-19 (×4): qty 1

## 2023-12-19 MED ORDER — ONDANSETRON HCL 4 MG/2ML IJ SOLN: 4.0000 mg | Freq: Four times a day (QID) | INTRAMUSCULAR | Status: DC | PRN

## 2023-12-19 MED ORDER — DEXAMETHASONE 4 MG PO TABS
2.0000 mg | ORAL_TABLET | Freq: Two times a day (BID) | ORAL | Status: DC
  Administered 2023-12-23 – 2023-12-27 (×9): 2 mg via ORAL
  Filled 2023-12-19 (×10): qty 1

## 2023-12-19 MED ORDER — OXYCODONE HCL 5 MG PO TABS
10.0000 mg | ORAL_TABLET | ORAL | Status: DC | PRN
  Administered 2023-12-19: 10 mg via ORAL
  Filled 2023-12-19: qty 2

## 2023-12-19 MED ORDER — PANTOPRAZOLE SODIUM 40 MG PO TBEC
40.0000 mg | DELAYED_RELEASE_TABLET | Freq: Every day | ORAL | Status: DC
  Administered 2023-12-19 – 2023-12-27 (×9): 40 mg via ORAL
  Filled 2023-12-19 (×9): qty 1

## 2023-12-19 MED ORDER — ACETAMINOPHEN 325 MG PO TABS: 650.0000 mg | ORAL_TABLET | Freq: Four times a day (QID) | ORAL | Status: DC | PRN

## 2023-12-19 MED ORDER — HYDROMORPHONE HCL 1 MG/ML IJ SOLN: 0.5000 mg | INTRAMUSCULAR | Status: DC | PRN

## 2023-12-19 MED ORDER — SENNA 8.6 MG PO TABS
1.0000 | ORAL_TABLET | Freq: Two times a day (BID) | ORAL | Status: DC
  Administered 2023-12-19 – 2023-12-27 (×17): 8.6 mg via ORAL
  Filled 2023-12-19 (×17): qty 1

## 2023-12-19 MED ORDER — HYDROMORPHONE HCL 1 MG/ML IJ SOLN
0.5000 mg | INTRAMUSCULAR | Status: DC | PRN
  Administered 2023-12-19 – 2023-12-20 (×8): 0.5 mg via INTRAVENOUS
  Filled 2023-12-19: qty 0.5
  Filled 2023-12-19: qty 1
  Filled 2023-12-19 (×6): qty 0.5

## 2023-12-19 MED ORDER — METOPROLOL TARTRATE 25 MG PO TABS
25.0000 mg | ORAL_TABLET | Freq: Two times a day (BID) | ORAL | Status: DC
  Administered 2023-12-19 – 2023-12-27 (×17): 25 mg via ORAL
  Filled 2023-12-19 (×17): qty 1

## 2023-12-19 MED ORDER — LEVETIRACETAM 500 MG PO TABS
500.0000 mg | ORAL_TABLET | Freq: Two times a day (BID) | ORAL | Status: DC
  Administered 2023-12-19 – 2023-12-27 (×18): 500 mg via ORAL
  Filled 2023-12-19 (×18): qty 1

## 2023-12-19 MED ORDER — POLYETHYLENE GLYCOL 3350 17 G PO PACK
17.0000 g | PACK | Freq: Every day | ORAL | Status: DC
  Administered 2023-12-19 – 2023-12-26 (×8): 17 g via ORAL
  Filled 2023-12-19 (×9): qty 1

## 2023-12-19 MED ORDER — OXYCODONE HCL 5 MG PO TABS
10.0000 mg | ORAL_TABLET | ORAL | Status: DC | PRN
  Administered 2023-12-19 – 2023-12-25 (×18): 15 mg via ORAL
  Administered 2023-12-25: 10 mg via ORAL
  Administered 2023-12-26 (×2): 15 mg via ORAL
  Filled 2023-12-19 (×14): qty 3
  Filled 2023-12-19: qty 2
  Filled 2023-12-19 (×3): qty 3
  Filled 2023-12-19: qty 2
  Filled 2023-12-19 (×3): qty 3

## 2023-12-19 MED ORDER — ACETAMINOPHEN 500 MG PO TABS
1000.0000 mg | ORAL_TABLET | Freq: Four times a day (QID) | ORAL | Status: DC
  Administered 2023-12-19 – 2023-12-27 (×27): 1000 mg via ORAL
  Filled 2023-12-19 (×30): qty 2

## 2023-12-19 MED ORDER — ALBUTEROL SULFATE (2.5 MG/3ML) 0.083% IN NEBU: 2.5000 mg | INHALATION_SOLUTION | RESPIRATORY_TRACT | Status: DC | PRN

## 2023-12-19 MED ORDER — ACETAMINOPHEN 650 MG RE SUPP: 650.0000 mg | Freq: Four times a day (QID) | RECTAL | Status: DC | PRN

## 2023-12-19 MED ORDER — DEXAMETHASONE 2 MG PO TABS
3.0000 mg | ORAL_TABLET | Freq: Three times a day (TID) | ORAL | Status: AC
  Administered 2023-12-19 – 2023-12-22 (×12): 3 mg via ORAL
  Filled 2023-12-19 (×13): qty 2

## 2023-12-19 MED ORDER — DICLOFENAC SODIUM 1 % EX GEL
2.0000 g | Freq: Four times a day (QID) | CUTANEOUS | Status: DC
  Administered 2023-12-19 – 2023-12-26 (×27): 2 g via TOPICAL
  Filled 2023-12-19 (×2): qty 100

## 2023-12-19 NOTE — Plan of Care (Signed)
 Patient with uncontrolled pain in the emergency department, and now inpatient admission is requested due to uncontrolled pain, and difficulty with outpatient follow-up.  Patient was seen and examined, and plan of care was discussed with palliative care as well as radiation oncology.  Physical exam: Blood pressure (!) 153/92, pulse 94, temperature (!) 97.4 F (36.3 C), temperature source Oral, resp. rate (!) 21, height 5\' 6"  (1.676 m), weight 53.9 kg, last menstrual period 06/06/2010, SpO2 97%.  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  She is status post stereotactic brain radiation, is currently on steroid taper.  She is also on Keppra.  She has yet to establish with oncology, tells me that she has difficulty getting to appointments, and has been very preoccupied with her autistic brother who lives with her, in whom she is trying to get into a facility.  She has been seen by palliative care, who has started her on p.o. oxycodone, with IV Dilaudid for breakthrough pain.  Patient states she has some moderate pain relief.  Discussed with radiation oncology Dr. Lorri Rota, who plans radiation to her sacral metastases during this hospital stay.  Patient has been admitted to the hospitalist service under observation status, her home medications have been continued, for further details please see consultation note from Dr. Segars.

## 2023-12-19 NOTE — ED Notes (Signed)
 Pt ambulated to bathroom

## 2023-12-19 NOTE — Consult Note (Signed)
 Initial Consultation Note   Patient: Regina [name] UJW:[id number] DOB: 1966/04/28 PCP: [name], NP DOA: 12/18/2023 DOS: the patient was seen and examined on 12/19/2023 Primary service: ED   Referring physician: Alona Bene, MD; Luther Hearing, PA  Reason for consult: Cancer related pain   Assessment/Plan: Assessment and Plan:  Stage IV non-small cell of the lung with mets to brain, bone, LN's, s/p stereotactic radiation surgery January 2025, pathological fracture of the right humerus s/p nailing and radiation to the humerus (completed radiation 3/21), recent admission 4/7-4/10 with encephalopathy related to cerebral edema from mets;  additional history of COPD, HTN, who presents to the emergency department with severe pain esp in the Left hip and right shoulder. On review of her prior PET she has a sacral bone met which was CT occult, and also has known R shoulder pathologic fx s/p fixation/ radiation. It appears she is having cancer related pain from her bony metastases and is not on any opiate therapy outpatient. I do not think she requires admission at this time, but certainly will need to start on medication for cancer related pain and be provided a short term Rx of opiate medications from the emergency room until she is able to establish with an outpatient provider who may continue these. Additional plan per below.   Stage IV NSCLC with cancer related pain  Left hip pain, suspect from sacral met (CT occult, PET +)  R shoulder pain, known met with hx pathologic Fx, fixation, radiation.  -Started on oxycodone 5/10 mg every 4 hours as needed for moderate/severe pain, Dilaudid 0.5 mg IV every 4 hours as needed for breakthrough.  Can continue home Flexeril as well. -Obtain x-ray of the left hip and pelvis and right humerus to rule out any fracture or hardware failure in the right arm -Palliative care consultation for assistance with pain control related to her malignancy -TOC  consult for home health evaluation in the morning -Ultimately if her left hip pain is not well-controlled or she develops worsening radicular symptoms she should undergo an MRI of the L-spine which may be pursued as outpatient. -She may be a candidate for radiation therapy of her sacral met, would refer to rad onc.  -She still needs to establish with oncology in the area  Chronic medical problems: History of cerebral edema from brain mets: Continue dexamethasone taper Seizure disorder: Continue home Keppra COPD: Not on inhalers Hypertension: Continue home metoprolol GERD: Continue home pantoprazole  Disposition: For now does not require admission, will remain as an ED hold. Plan including attempted pain control per above, x-rays, TOC and palliative involvement in the morning.  Hopefully she can be sent home with an Rx for opiate medications from the ED with a plan to establish with outpatient providers who can continue this.    TRH will sign off at present, please call us again when needed.   ==========================================================  CC: R shoulder, L hip pain   HPI:  Regina [name] is a 58 y.o. female with hx of Stage IV non-small cell of the lung with mets to brain, bone, LN's, s/p stereotactic radiation surgery January 2025, pathological fracture of the right humerus s/p nailing and radiation to the humerus (completed radiation 3/21), recent admission 4/7-4/10 with encephalopathy related to cerebral edema from mets;  additional history of COPD, HTN, who presents to the emergency department with severe pain esp in the Left hip and right shoulder. She remains in severe pain at time of my interview. Frustrated and uncomfortable.  Has trouble answering some questions, but pain is worst in the L hip and in the R shoulder. Severe and unremitting. Not significantly improved with meds so far in the ED. No numbness or weakness in her leg. No recent fall or injury. Does not have OP  provider Rx pain meds.     Review of Systems: As mentioned in the history of present illness. All other systems reviewed and are negative. Past Medical History:  Diagnosis Date   Allergy    Anxiety    Brain mass    COPD (chronic obstructive pulmonary disease) (HCC)    Emphysema of lung (HCC)    Hypertension    Past Surgical History:  Procedure Laterality Date   APPLICATION OF CRANIAL NAVIGATION Left 10/07/2023   Procedure: APPLICATION OF CRANIAL NAVIGATION;  Surgeon: Coletta Memos, MD;  Location: [facility] OR;  Service: Neurosurgery;  Laterality: Left;   CRANIOTOMY Left 10/07/2023   Procedure: FRONTAL CRANIOTOMY FOR ABSCESS;  Surgeon: Coletta Memos, MD;  Location: [facility] OR;  Service: Neurosurgery;  Laterality: Left;   dental procedure     HUMERUS IM NAIL Right 10/10/2023   Procedure: INTRAMEDULLARY (IM) NAIL HUMERAL RIGHT;  Surgeon: Huel Cote, MD;  Location: [facility] OR;  Service: Orthopedics;  Laterality: Right;   Social History:  reports that she quit smoking about 4 years ago. Her smoking use included cigarettes. She has never used smokeless tobacco. She reports current alcohol use of about 6.0 standard drinks of alcohol per week. She reports current drug use. Frequency: 4.00 times per week. Drug: Marijuana.  Allergies  Allergen Reactions   Pork Allergy Nausea And Vomiting   Bee Venom Hives    Family History  Problem Relation Age of Onset   Leukemia Mother    Hypertension Mother    Hypertension Father    Cancer Neg Hx    Diabetes Neg Hx    Heart disease Neg Hx    Colon cancer Neg Hx    Esophageal cancer Neg Hx    Rectal cancer Neg Hx    Stomach cancer Neg Hx     Prior to Admission medications   Medication Sig Start Date End Date Taking? Authorizing Provider  cyclobenzaprine (FLEXERIL) 10 MG tablet Take 1 tablet (10 mg total) by mouth at bedtime for 10 days. 12/10/23 12/20/23 Yes Smitty Knudsen, PA-C  dexamethasone (DECADRON) 2 MG tablet Take 2 tablets (4 mg total) by mouth  every 6 (six) hours for 3 days, THEN 1.5 tablets (3 mg total) every 8 (eight) hours for 5 days, THEN 1 tablet (2 mg total) every 12 (twelve) hours and stay on this till seen by medical oncology/radiation oncology. 12/15/23 01/14/24 Yes Ghimire, Werner Lean, MD  diclofenac Sodium (VOLTAREN) 1 % GEL Apply 2 g topically 4 (four) times daily. 12/10/23  Yes Smitty Knudsen, PA-C  levETIRAcetam (KEPPRA) 500 MG tablet Take 1 tablet (500 mg total) by mouth 2 (two) times daily. 12/15/23  Yes Ghimire, Werner Lean, MD  metoprolol tartrate (LOPRESSOR) 25 MG tablet Take 1 tablet (25 mg total) by mouth 2 (two) times daily. 12/15/23  Yes Ghimire, Werner Lean, MD  pantoprazole (PROTONIX) 40 MG tablet Take 1 tablet (40 mg total) by mouth daily. 12/15/23 12/14/24 Yes GhimireWerner Lean, MD    Physical Exam: Vitals:   12/18/23 1726 12/18/23 1903 12/18/23 2015 12/18/23 2345  BP: (!) 187/113 (!) 139/118 (!) 160/96 (!) 158/82  Pulse: (!) 123 (!) 109 92 94  Resp: 18 18 18 17   Temp: 97.8  F (36.6 C) 98 F (36.7 C) 98.1 F (36.7 C) 98 F (36.7 C)  TempSrc:   Oral Oral  SpO2: 100% 100% 100% 96%  Weight:      Height:        Gen: Awake, alert, uncomfortable appearing.  CV: Regular, normal S1, S2, no murmurs  Resp: Normal WOB, CTAB  Abd: Flat, normoactive, nontender MSK: R shoulder in sling, tender to palpation. L hip painful with log roll, but intact ROM in the hip, knee, ankle, distally NV intact..  Skin: No rashes or lesions to exposed skin  Neuro: Alert and interactive  Psych: tearful, appropriate    Data Reviewed:   K 3  Bicarb 21, no AG  WBC 15   Family Communication: No  Primary team communication: Yes discussed recommendations above with ED provider   Thank you very much for involving us  in the care of your patient.  Author: Arnulfo Larch, MD 12/19/2023 3:10 AM  For on call review [email or web address].

## 2023-12-19 NOTE — Consult Note (Signed)
 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  Of note patient had recent admission for 4/7-10 for management of encephalopathy related to cerebral edema from metastatic disease.  Patient initially refusing admission upon presentation to ER.  Palliative medicine team consulted to assist with complex medical decision making. Of note patient has been seen by palliative medicine team during prior admissions.  Patient had also been referred to outpatient palliative medicine follow-up at [facility] though is noted to have difficulties getting to appointments as she assist with care of her autistic brother who lives with her.  Extensive review of EMR prior to presenting to bedside.  Also able to discuss care with radiation oncologist, [name], regarding radiation to assist with symptom management in setting of lytic lesions to patient's right humerus and left sacral region. Review of CMP noting sodium 132, potassium 3, BUN 8, creatinine 0.73, and albumin 3.8.  Patient's CBC noted to have WBC 15, hemoglobin 14.7, and platelets 524.  Reviewed recent imaging including right humerus and left hip and pelvis x-rays. Patient does have ACP documentation on file from 10/03/2023 naming her HCPOA as [name] Worthley-Crawford.  Presented to bedside to see patient.  No family or  friends present at bedside.  Patient laying in bed in fetal position.  Able to introduce myself as a member of the palliative medicine team and my role in patient's medical journey.  Patient noting that she is in severe pain at this time.  Patient tearful at times.  Patient describes stabbing pain in her left hip and right shoulder.  Patient notes this is pain she has felt previously.  Discussed with patient consideration of radiation to assist with pain.  Patient is agreement with proceeding with radiation.  She has had difficulty making outpatient appointments for this due to social factors.  Acknowledged and noted could initiate here in the hospital.  Will coordinate with radiation oncology. In terms of current medications for pain management, patient has been receiving as needed oxycodone and IV Dilaudid.  Patient received as needed oxycodone 5 mg x 1 dose and 10 mg x 1 dose.  Patient does not feel the oxycodone helps enough with her pain management when receiving the 10 mg dose.  Discussed increasing range of oxycodone which patient is agreeing with.  Patient does feel the IV Dilaudid assist with pain management though quickly wears off.  Discussed would adjust IV Dilaudid to 0.5 mg every 2 hours as needed for breakthrough pain.  Patient agreeing with this plan.  Patient already receiving dexamethasone in setting of cerebral edema recently, hoping can assist with management of bone pain.  Spent time providing emotional support via active listening.  Noted palliative medicine team continuing to follow along with patient's medical journey.  Discussed care with IDT during the day including hospitalist, RN, and radiation oncology to coordinate care.  Patient will be admitted at this  time to proceed with radiation.  Primary Diagnoses  Present on Admission: **None**   Palliative Review of Systems: Right arm pain and left hip pain  Past Medical History:  Diagnosis Date   Allergy    Anxiety    Brain  mass    COPD (chronic obstructive pulmonary disease) (HCC)    Emphysema of lung (HCC)    Hypertension    Social History   Socioeconomic History   Marital status: Widowed    Spouse name: Not on file   Number of children: 1    Years of education: 43    Highest education level: Not on file  Occupational History   Occupation: Set designer: [employer]    Comment: Nurse, mental health   Tobacco Use   Smoking status: Former    Current packs/day: 0.00    Types: Cigarettes    Quit date: 10/31/2019    Years since quitting: 4.1   Smokeless tobacco: Never  Vaping Use   Vaping status: Never Used  Substance and Sexual Activity   Alcohol use: Yes    Alcohol/week: 6.0 standard drinks of alcohol    Types: 6 Cans of beer per week   Drug use: Yes    Frequency: 4.0 times per week    Types: Marijuana   Sexual activity: Not Currently  Other Topics Concern   Not on file  Social History Narrative   Lives with her brother.    Son lives near by, 47 yo, married.    Social Drivers of Corporate investment banker Strain: Not on file  Food Insecurity: No Food Insecurity (12/13/2023)   Hunger Vital Sign    Worried About Running Out of Food in the Last Year: Never true    Ran Out of Food in the Last Year: Never true  Transportation Needs: No Transportation Needs (12/13/2023)   PRAPARE - Administrator, Civil Service (Medical): No    Lack of Transportation (Non-Medical): No  Physical Activity: Not on file  Stress: Not on file  Social Connections: Not on file   Family History  Problem Relation Age of Onset   Leukemia Mother    Hypertension Mother    Hypertension Father    Cancer Neg Hx    Diabetes Neg Hx    Heart disease Neg Hx    Colon cancer Neg Hx    Esophageal cancer Neg Hx    Rectal cancer Neg Hx    Stomach cancer Neg Hx    Scheduled Meds:  acetaminophen  1,000 mg Oral Q6H   cyclobenzaprine  10 mg Oral QHS   dexamethasone  3 mg Oral Q8H   Followed  by   Melene Muller ON 12/23/2023] dexamethasone  2 mg Oral Q12H   diclofenac Sodium  2 g Topical QID   levETIRAcetam  500 mg Oral BID   metoprolol tartrate  25 mg Oral BID   pantoprazole  40 mg Oral Daily   Continuous Infusions: PRN Meds:.HYDROmorphone (DILAUDID) injection, oxyCODONE **OR** oxyCODONE Allergies  Allergen Reactions   Pork Allergy Nausea And Vomiting   Bee Venom Hives   CBC:    Component Value Date/Time   WBC 15.0 (H) 12/18/2023 1745   HGB 14.7 12/18/2023 1745   HGB 12.1 11/02/2023 0845   HGB 13.5 10/19/2017 0942   HCT 43.8 12/18/2023 1745   HCT 38.9 10/19/2017 0942   PLT 524 (H) 12/18/2023 1745   PLT 460 (H) 11/02/2023 0845   PLT 550 (  H) 10/19/2017 0942   MCV 84.2 12/18/2023 1745   MCV 86 10/19/2017 0942   NEUTROABS 8.2 (H) 12/13/2023 0523   NEUTROABS 4.3 10/19/2017 0942   LYMPHSABS 1.8 12/13/2023 0523   LYMPHSABS 2.0 10/19/2017 0942   MONOABS 0.6 12/13/2023 0523   EOSABS 0.0 12/13/2023 0523   EOSABS 0.1 10/19/2017 0942   BASOSABS 0.1 12/13/2023 0523   BASOSABS 0.1 10/19/2017 0942   Comprehensive Metabolic Panel:    Component Value Date/Time   NA 132 (L) 12/18/2023 1745   NA 130 (L) 10/19/2017 0942   K 3.0 (L) 12/18/2023 1745   CL 97 (L) 12/18/2023 1745   CO2 21 (L) 12/18/2023 1745   BUN 8 12/18/2023 1745   BUN 4 (L) 10/19/2017 0942   CREATININE 0.73 12/18/2023 1745   CREATININE 0.51 11/02/2023 0845   GLUCOSE 100 (H) 12/18/2023 1745   CALCIUM 9.4 12/18/2023 1745   AST 26 12/18/2023 1745   AST 16 11/02/2023 0845   ALT 17 12/18/2023 1745   ALT 13 11/02/2023 0845   ALKPHOS 59 12/18/2023 1745   BILITOT 0.4 12/18/2023 1745   BILITOT 0.3 11/02/2023 0845   PROT 7.2 12/18/2023 1745   PROT 7.6 10/19/2017 0942   ALBUMIN 3.8 12/18/2023 1745   ALBUMIN 4.6 10/19/2017 0942    Physical Exam: Vital Signs: BP (!) 130/99 (BP Location: Left Arm)   Pulse 97   Temp 97.7 F (36.5 C) (Oral)   Resp 17   Ht 5\' 6"  (1.676 m)   Wt 53.9 kg   LMP 06/06/2010   SpO2  99%   BMI 19.18 kg/m  SpO2: SpO2: 99 % O2 Device: O2 Device: Room Air O2 Flow Rate:   Intake/output summary:  Intake/Output Summary (Last 24 hours) at 12/19/2023 0804 Last data filed at 12/19/2023 0103 Gross per 24 hour  Intake 1000 ml  Output --  Net 1000 ml   LBM:   Baseline Weight: Weight: 53.9 kg Most recent weight: Weight: 53.9 kg  General: NAD, alert, grimacing, tearful at times, in fetal position, cachectic, frail Cardiovascular: RRR, no edema in LE b/l Respiratory: no increased work of breathing noted, not in respiratory distress Neuro: Awake, appropriately interactive Psych: Tearful and in pain          Palliative Performance Scale: 20%               Additional Data Reviewed: Recent Labs    12/18/23 1745  WBC 15.0*  HGB 14.7  PLT 524*  NA 132*  BUN 8  CREATININE 0.73    Imaging: DG Humerus Right CLINICAL DATA:  Metastatic lung cancer. Pathologic fracture with pain  EXAM: RIGHT HUMERUS - 2+ VIEW  COMPARISON:  11/21/2023  FINDINGS: Large lytic lesion in the humeral shaft with spanning intramedullary nail. The distal interlocking screw has backed out, eccentric tip position within the lower humeral shaft is unchanged although there is some new periosteal reaction around the screw. No new fracture when accounting for overlapping external artifact. Generalized osteopenia with permeative appearance throughout the humeral shaft, markedly progressed from 10/01/2023.  IMPRESSION: Extensive destructive metastasis in the right humeral shaft with prominent progression since 10/01/2023. The lower interlocking screw of the medullary nail has backed out since 11/21/2023 with regional periosteal reaction likely from motion, tip being eccentric within the lower humeral shaft.  Electronically Signed   By: Tiburcio Pea M.D.   On: 12/19/2023 04:36 DG HIP UNILAT WITH PELVIS 2-3 VIEWS LEFT CLINICAL DATA:  Metastatic cancer.  Leg pain.  EXAM: DG HIP (WITH  OR WITHOUT PELVIS) 3V LEFT  COMPARISON:  12/06/2023  FINDINGS: There is no evidence of hip fracture or dislocation. There is no evidence of arthropathy or other focal bone abnormality. Calcified fibroid in the pelvis which is small.  IMPRESSION: No acute finding or explanation for hip pain.  Electronically Signed   By: Ronnette Coke M.D.   On: 12/19/2023 04:32    I personally reviewed recent imaging.   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  Of note patient had recent admission for 4/7-10 for management of encephalopathy related to cerebral edema from metastatic disease.  Patient initially refusing admission upon presentation to ER.  Palliative medicine team consulted to assist with complex medical decision making. Of note patient has been seen by palliative medicine team during prior admissions.  Patient had also been referred to outpatient palliative medicine follow-up at [facility] though is noted to have difficulties getting to appointments as she assist with care of her autistic brother who lives with her.  # Symptom management Patient is receiving these palliative interventions for symptom management with an intent to improve quality of life.   - Pain, severe acute pain in setting of metastatic small cell lung cancer to bone   - Change IV Dilaudid to 0.5 mg every 2 hours as needed   - Change oxycodone to 10-50 mg every 4 hours as needed   - Radiation oncology involved and planning to proceed with radiation for  management   - Receiving steroids in setting of recent cerebral edema   - Consider addition of NSAIDs if appropriate   - May need long-acting medication moving forward   - Constipation Starting bowel regimen while receiving opioids.   - Start senna 1 tab twice daily   - Start MiraLAX 17 g daily  # Psycho-social/Spiritual Support:  - Support System: niece- [name]  # Discharge Planning:  To Be Determined  Thank you for allowing the palliative care team to participate in the care Kinnley Forrer.  Barnett Libel, DO Palliative Care Provider PMT # [phone]  If patient remains symptomatic despite maximum doses, please call PMT at [phone] between 0700 and 1900. Outside of these hours, please call attending, as PMT does not have night coverage.

## 2023-12-19 NOTE — Plan of Care (Signed)

## 2023-12-19 NOTE — Progress Notes (Signed)
 Radiation Oncology         [phone]) [phone] ________________________________  Inpatient Re-Consultation  Name: Regina [name] MRN: [id number]  Date of Service: 12/20/2023 DOB: June 24, 1966  QQ:[id number], Meade Spencer, NP  Richardson Chang, DO   REFERRING PHYSICIAN: Richardson Chang, DO  DIAGNOSIS: There were no encounter diagnoses.  No diagnosis found.  HISTORY OF PRESENT ILLNESS: Regina [name] is a 58 y.o. female seen at the request of Dr. Aaron Aas In summary, she was initially found to have a left frontal brain tumor in 09/2023. 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 She was treated with three doses of SRS, followed by craniotomy on 10/07/23. She is also s/p right humeral IM nailing on 10/10/23 for the pathologic fracture. She was referred to Dr. Marguerita Shih to discuss treatment options, but she opted to forego treatment due to "feeling good." She developed worsening right arm pain and was hospitalized on 11/22/23. She was seen by my partner, Dr. Lurena Sally, and received two doses of radiation to the left humeral head. She was briefly lost to follow up, as outreach could not get a hold of the patient.  Most recently, she presented to the ED on 12/18/23 with severe body pains, specifically in her legs and right arm.  PREVIOUS RADIATION THERAPY: Yes  11/24/23 - 11/25/23: Right humeral head / 13 Gy in 2 fractions  10/04/23 - 10/06/23: Left frontal brain / 24 Gy in 3 fractions (preop SRS)  PAST MEDICAL HISTORY:  Past Medical History:  Diagnosis Date   Allergy    Anxiety    Brain mass    COPD (chronic obstructive pulmonary disease) (HCC)    Emphysema of lung (HCC)    Hypertension       PAST SURGICAL HISTORY: Past Surgical History:  Procedure Laterality Date   APPLICATION OF CRANIAL NAVIGATION Left 10/07/2023   Procedure: APPLICATION  OF CRANIAL NAVIGATION;  Surgeon: Audie Bleacher, MD;  Location: [facility] OR;  Service: Neurosurgery;  Laterality: Left;   CRANIOTOMY Left 10/07/2023   Procedure: FRONTAL CRANIOTOMY FOR ABSCESS;  Surgeon: Audie Bleacher, MD;  Location: [facility] OR;  Service: Neurosurgery;  Laterality: Left;   dental procedure     HUMERUS IM NAIL Right 10/10/2023   Procedure: INTRAMEDULLARY (IM) NAIL HUMERAL RIGHT;  Surgeon: Wilhelmenia Harada, MD;  Location: [facility] OR;  Service: Orthopedics;  Laterality: Right;    FAMILY HISTORY:  Family History  Problem Relation Age of Onset   Leukemia Mother    Hypertension Mother    Hypertension Father    Cancer Neg Hx    Diabetes Neg Hx    Heart disease Neg Hx    Colon cancer Neg Hx    Esophageal cancer Neg Hx    Rectal cancer Neg Hx    Stomach cancer Neg Hx     SOCIAL HISTORY:  Social History   Socioeconomic History   Marital status: Widowed    Spouse name: Not on file   Number of children: 1    Years of education: 88    Highest education level: Not on file  Occupational History   Occupation: Audiological scientist    Employer: [employer]    Comment: Nurse, mental health   Tobacco Use   Smoking status: Former    Current packs/day: 0.00    Types: Cigarettes    Quit date: 10/31/2019    Years since  quitting: 4.1   Smokeless tobacco: Never  Vaping Use   Vaping status: Never Used  Substance and Sexual Activity   Alcohol use: Yes    Alcohol/week: 6.0 standard drinks of alcohol    Types: 6 Cans of beer per week   Drug use: Yes    Frequency: 4.0 times per week    Types: Marijuana   Sexual activity: Not Currently  Other Topics Concern   Not on file  Social History Narrative   Lives with her brother.    Son lives near by, 28 yo, married.    Social Drivers of Corporate investment banker Strain: Not on file  Food Insecurity: No Food Insecurity (12/13/2023)   Hunger Vital Sign    Worried About Running Out of Food in the Last Year: Never true    Ran Out of Food in the  Last Year: Never true  Transportation Needs: No Transportation Needs (12/13/2023)   PRAPARE - Administrator, Civil Service (Medical): No    Lack of Transportation (Non-Medical): No  Physical Activity: Not on file  Stress: Not on file  Social Connections: Not on file  Intimate Partner Violence: Not At Risk (12/13/2023)   Humiliation, Afraid, Rape, and Kick questionnaire    Fear of Current or Ex-Partner: No    Emotionally Abused: No    Physically Abused: No    Sexually Abused: No    ALLERGIES: Pork allergy and Bee venom  MEDICATIONS:  No current facility-administered medications for this encounter.   No current outpatient medications on file.   Facility-Administered Medications Ordered in Other Encounters  Medication Dose Route Frequency Provider Last Rate Last Admin   acetaminophen (TYLENOL) tablet 650 mg  650 mg Oral Q6H PRN Kirby Crigler, Mir M, MD       Or   acetaminophen (TYLENOL) suppository 650 mg  650 mg Rectal Q6H PRN Kirby Crigler, Mir M, MD       acetaminophen (TYLENOL) tablet 1,000 mg  1,000 mg Oral Q6H Segars, Christiane Ha, MD   1,000 mg at 12/19/23 1158   albuterol (PROVENTIL) (2.5 MG/3ML) 0.083% nebulizer solution 2.5 mg  2.5 mg Nebulization Q2H PRN Kirby Crigler, Mir M, MD       cyclobenzaprine (FLEXERIL) tablet 10 mg  10 mg Oral QHS Segars, Christiane Ha, MD       dexamethasone (DECADRON) tablet 3 mg  3 mg Oral Q8H Segars, Christiane Ha, MD   3 mg at 12/19/23 1413   Followed by   Melene Muller ON 12/23/2023] dexamethasone (DECADRON) tablet 2 mg  2 mg Oral Q12H Segars, Christiane Ha, MD       diclofenac Sodium (VOLTAREN) 1 % topical gel 2 g  2 g Topical QID Dolly Rias, MD   2 g at 12/19/23 1203   HYDROmorphone (DILAUDID) injection 0.5 mg  0.5 mg Intravenous Q2H PRN Alena Bills, DO   0.5 mg at 12/19/23 1024   levETIRAcetam (KEPPRA) tablet 500 mg  500 mg Oral BID Dolly Rias, MD   500 mg at 12/19/23 1030   metoprolol tartrate (LOPRESSOR) tablet 25 mg  25 mg Oral BID Dolly Rias, MD   25 mg at 12/19/23 1030   ondansetron (ZOFRAN) tablet 4 mg  4 mg Oral Q6H PRN Kirby Crigler, Mir M, MD       Or   ondansetron [facility]) injection 4 mg  4 mg Intravenous Q6H PRN Kirby Crigler, Mir M, MD       oxyCODONE (Oxy IR/ROXICODONE) immediate release tablet 10-15 mg  10-15 mg  Oral Q4H PRN Mims, Lauren W, DO   15 mg at 12/19/23 1158   pantoprazole (PROTONIX) EC tablet 40 mg  40 mg Oral Daily Segars, Arlyce Lambert, MD   40 mg at 12/19/23 1030   polyethylene glycol (MIRALAX / GLYCOLAX) packet 17 g  17 g Oral Daily Mims, Lauren W, DO   17 g at 12/19/23 1412   senna (SENOKOT) tablet 8.6 mg  1 tablet Oral BID Barnett Libel W, DO   8.6 mg at 12/19/23 1412   traZODone (DESYREL) tablet 50 mg  50 mg Oral QHS PRN Gaylin Ke, MD        REVIEW OF SYSTEMS:  On review of systems, the patient reports that she is doing well overall. She denies any chest pain, shortness of breath, cough, fevers, chills, night sweats, unintended weight changes. She denies any bowel or bladder disturbances, and denies abdominal pain, nausea or vomiting. She denies any new musculoskeletal or joint aches or pains.*** A complete review of systems is obtained and is otherwise negative.    PHYSICAL EXAM:  Wt Readings from Last 3 Encounters:  12/18/23 118 lb 13.3 oz (53.9 kg)  12/15/23 118 lb 13.3 oz (53.9 kg)  12/06/23 123 lb (55.8 kg)   Temp Readings from Last 3 Encounters:  12/19/23 98.2 F (36.8 C) (Oral)  12/15/23 98.3 F (36.8 C) (Oral)  12/10/23 98.6 F (37 C) (Oral)   BP Readings from Last 3 Encounters:  12/19/23 129/72  12/15/23 (!) 145/75  12/10/23 (!) 133/108   Pulse Readings from Last 3 Encounters:  12/19/23 74  12/15/23 98  12/10/23 90    /10  In general this is a well appearing *** woman in no acute distress. She's alert and oriented x4 and appropriate throughout the examination. Cardiopulmonary assessment is negative for acute distress and she exhibits normal effort.     KPS = ***  100  - Normal; no complaints; no evidence of disease. 90   - Able to carry on normal activity; minor signs or symptoms of disease. 80   - Normal activity with effort; some signs or symptoms of disease. 42   - Cares for self; unable to carry on normal activity or to do active work. 60   - Requires occasional assistance, but is able to care for most of his personal needs. 50   - Requires considerable assistance and frequent medical care. 40   - Disabled; requires special care and assistance. 30   - Severely disabled; hospital admission is indicated although death not imminent. 20   - Very sick; hospital admission necessary; active supportive treatment necessary. 10   - Moribund; fatal processes progressing rapidly. 0     - Dead  Karnofsky DA, Abelmann WH, Craver LS and Burchenal [facility] [phone]) The use of the nitrogen mustards in the palliative treatment of carcinoma: with particular reference to bronchogenic carcinoma Cancer 1 [id number]  LABORATORY DATA:  Lab Results  Component Value Date   WBC 15.0 (H) 12/18/2023   HGB 14.7 12/18/2023   HCT 43.8 12/18/2023   MCV 84.2 12/18/2023   PLT 524 (H) 12/18/2023   Lab Results  Component Value Date   NA 132 (L) 12/18/2023   K 3.0 (L) 12/18/2023   CL 97 (L) 12/18/2023   CO2 21 (L) 12/18/2023   Lab Results  Component Value Date   ALT 17 12/18/2023   AST 26 12/18/2023   ALKPHOS 59 12/18/2023   BILITOT 0.4 12/18/2023     RADIOGRAPHY: DG  Humerus Right Result Date: 12/19/2023 CLINICAL DATA:  Metastatic lung cancer. Pathologic fracture with pain EXAM: RIGHT HUMERUS - 2+ VIEW COMPARISON:  11/21/2023 FINDINGS: Large lytic lesion in the humeral shaft with spanning intramedullary nail. The distal interlocking screw has backed out, eccentric tip position within the lower humeral shaft is unchanged although there is some new periosteal reaction around the screw. No new fracture when accounting for overlapping external artifact. Generalized osteopenia with  permeative appearance throughout the humeral shaft, markedly progressed from 10/01/2023. IMPRESSION: Extensive destructive metastasis in the right humeral shaft with prominent progression since 10/01/2023. The lower interlocking screw of the medullary nail has backed out since 11/21/2023 with regional periosteal reaction likely from motion, tip being eccentric within the lower humeral shaft. Electronically Signed   By: Ronnette Coke M.D.   On: 12/19/2023 04:36   DG HIP UNILAT WITH PELVIS 2-3 VIEWS LEFT Result Date: 12/19/2023 CLINICAL DATA:  Metastatic cancer.  Leg pain. EXAM: DG HIP (WITH OR WITHOUT PELVIS) 3V LEFT COMPARISON:  12/06/2023 FINDINGS: There is no evidence of hip fracture or dislocation. There is no evidence of arthropathy or other focal bone abnormality. Calcified fibroid in the pelvis which is small. IMPRESSION: No acute finding or explanation for hip pain. Electronically Signed   By: Ronnette Coke M.D.   On: 12/19/2023 04:32   MR BRAIN W WO CONTRAST Result Date: 12/14/2023 CLINICAL DATA:  Metastatic disease evaluation. Stage IV non-small cell lung cancer treated with radiation in January. EXAM: MRI HEAD WITHOUT AND WITH CONTRAST TECHNIQUE: Multiplanar, multiecho pulse sequences of the brain and surrounding structures were obtained without and with intravenous contrast. CONTRAST:  5.64mL GADAVIST GADOBUTROL 1 MMOL/ML IV SOLN COMPARISON:  CT 2 days ago.  MRI 10/01/2023 FINDINGS: Brain: Diffusion imaging does not show any acute or subacute infarction or other cause of restricted diffusion. Interval left frontal craniotomy for mass resection. No focal abnormality seen affecting the brainstem or cerebellum. Right cerebral hemisphere shows chronic small-vessel ischemic changes of the white matter as seen previously. On the left, there is been previous frontal craniotomy for partial resection of the previously seen mass lesion. Previous measurements were 3.2 x 3.4 x 2.5 cm. Today, the lesion  measures 2.0 x 1.7 x 1.4 cm. Small resection cavity is present superior to the enhancing mass lesion. No second lesion is identified. The amount of regional vasogenic edema appears slightly reduced, with the slight decrease in mass effect and reduction in left to right shift from 1 cm to 4 mm. No hydrocephalus. No extra-axial collection. Vascular: Major vessels at the base of the brain show flow. Skull and upper cervical spine: Otherwise negative Sinuses/Orbits: Clear other than mild mucosal thickening of the left maxillary sinus. Orbits negative. Other: None IMPRESSION: Interval left frontal craniotomy for partial resection of the previously seen mass lesion. Previous measurements were 3.2 x 3.4 x 2.5 cm. Today, the lesion measures 2.0 x 1.7 x 1.4 cm. Small resection cavity is present superior to the enhancing mass lesion. The amount of regional vasogenic edema appears slightly reduced, with the slight decrease in mass effect and reduction in left to right shift from 1 cm to 4 mm. No second lesion is identified. Electronically Signed   By: Bettylou Brunner M.D.   On: 12/14/2023 15:06   EEG adult Result Date: 12/14/2023 Arleene Lack, MD     12/14/2023 11:58 AM Patient Name: Finlay Mills MRN: [id number] Epilepsy Attending: Arleene Lack Referring Physician/Provider: Burton Casey, MD Date: 12/14/2023 Duration: 22.28 mins  Patient history: 58yo F with ams. EEG to evaluate for seizure Level of alertness: Awake AEDs during EEG study: None Technical aspects: This EEG study was done with scalp electrodes positioned according to the 10-20 International system of electrode placement. Electrical activity was reviewed with band pass filter of 1-70Hz , sensitivity of 7 uV/mm, display speed of 70mm/sec with a 60Hz  notched filter applied as appropriate. EEG data were recorded continuously and digitally stored.  Video monitoring was available and reviewed as appropriate. Description: The posterior dominant rhythm consists  of 7.5 Hz activity of moderate voltage (25-35 uV) seen predominantly in posterior head regions, symmetric and reactive to eye opening and eye closing. EEG showed continuous 3 to 6 Hz theta-delta slowing in left hemisphere. There is also 3-5Hz  sharply contoured theta-delta slowing in left frontal region consistent with breach artifact. Hyperventilation and photic stimulation were not performed.   ABNORMALITY - Breach artifact, left frontal region - Continuous slow, left hemisphere IMPRESSION: This study is suggestive of cortical dysfunction in left frontal region consistent with underlying craniotomy. Additionally there is cortical dysfunction arising from left hemisphere likely secondary to underlying structural abnormality. No seizures or definite epileptiform discharges were seen throughout the recording. Charlsie Quest   CT Angio Chest PE W and/or Wo Contrast Result Date: 12/12/2023 CLINICAL DATA:  SVT positive D-dimer history of lung cancer EXAM: CT ANGIOGRAPHY CHEST WITH CONTRAST TECHNIQUE: Multidetector CT imaging of the chest was performed using the standard protocol during bolus administration of intravenous contrast. Multiplanar CT image reconstructions and MIPs were obtained to evaluate the vascular anatomy. RADIATION DOSE REDUCTION: This exam was performed according to the departmental dose-optimization program which includes automated exposure control, adjustment of the mA and/or kV according to patient size and/or use of iterative reconstruction technique. CONTRAST:  OMNIPAQUE IOHEXOL 350 MG/ML SOLN COMPARISON:  PET CT 10/27/2023, CT 09/17/2023 FINDINGS: Cardiovascular: Satisfactory opacification of the pulmonary arteries to the segmental level. No evidence of pulmonary embolism. Mild atherosclerosis. No aneurysm. Normal cardiac size. No pericardial effusion Mediastinum/Nodes: Patent trachea. No thyroid mass. Right supraclavicular nodes measuring up to 7 mm. Redemonstrated multiple enlarged  paratracheal, bilateral hilar and subcarinal adenopathy consistent with metastatic disease. Index right paratracheal node measures about 15 mm on series 6, image 51, compared with 14 mm previously. Right hilar nodes measuring up to 14 mm on series 6, image 72, previously 14 mm. Left hilar nodes measuring up to 14 mm on series 6, image 80, previously 13 mm. Esophagus within normal limits. Lungs/Pleura: Emphysema. Honeycombing and fibrosis at the lung bases. No acute airspace disease. Pleuroparenchymal scarring at the apices. Lobulated right apical lung mass measuring 2.7 x 1.9 cm on series 8, image 19, previously 2.9 by 1.5 cm. Upper Abdomen: No acute abnormality. Musculoskeletal: No chest wall abnormality. No acute or significant osseous findings. Lytic lesion in the mid right humerus on scout image. Review of the MIP images confirms the above findings. IMPRESSION: 1. Negative for acute pulmonary embolus. 2. Emphysema. Fibrosis and honeycombing at the lung bases. No acute airspace disease. 3. Redemonstrated right apical lung mass consistent with known malignancy. Mediastinal and bilateral hilar adenopathy consistent with metastatic disease, similar to prior. Lytic lesion in the mid right humerus on scout image consistent with metastatic disease. Aortic Atherosclerosis (ICD10-I70.0) and Emphysema (ICD10-J43.9). Electronically Signed   By: Jasmine Pang M.D.   On: 12/12/2023 23:40   CT ANGIO HEAD NECK W WO CM Result Date: 12/12/2023 CLINICAL DATA:  Initial evaluation for acute speech changes. EXAM: CT ANGIOGRAPHY  HEAD AND NECK WITH AND WITHOUT CONTRAST TECHNIQUE: Multidetector CT imaging of the head and neck was performed using the standard protocol during bolus administration of intravenous contrast. Multiplanar CT image reconstructions and MIPs were obtained to evaluate the vascular anatomy. Carotid stenosis measurements (when applicable) are obtained utilizing NASCET criteria, using the distal internal carotid  diameter as the denominator. RADIATION DOSE REDUCTION: This exam was performed according to the departmental dose-optimization program which includes automated exposure control, adjustment of the mA and/or kV according to patient size and/or use of iterative reconstruction technique. CONTRAST:  130mL OMNIPAQUE IOHEXOL 350 MG/ML SOLN COMPARISON:  Prior study from 10/10/2023 and earlier. FINDINGS: CT HEAD FINDINGS Brain: Persistent vasogenic edema throughout the left frontal lobe, consistent with no in mass lesion in this region. The mass itself is not well visualized on this noncontrast examination. Partial effacement of the left lateral ventricle with 4 mm of left-to-right shift. No other mass lesion or mass effect. No acute large vessel territory infarct. No hydrocephalus. Postoperative changes from prior left frontal craniotomy. Small residual postoperative collection subjacent to the craniotomy bone flap measures up to 4 mm in maximal thickness (series 9, image 27). Vascular: No abnormal hyperdense vessel. Skull: Post craniotomy changes on the left. Soft tissue swelling present at the right occipital scalp. Calvarium intact. Sinuses/Orbits: Globes and orbital soft tissues within normal limits. Mucosal thickening with air-fluid level noted within the left maxillary sinus. Paranasal sinuses are otherwise clear. No mastoid effusion. Other: None. Review of the MIP images confirms the above findings CTA NECK FINDINGS Aortic arch: Examination degraded by motion artifact. Visualized aortic arch within normal limits for caliber with standard 3 vessel morphology. No significant stenosis about the origin the great vessels. Right carotid system: Right common and internal carotid arteries are patent within the neck. No visible dissection. Atheromatous plaque about the right carotid bulb without hemodynamically significant greater than 50% stenosis. Left carotid system: Left common and internal carotid arteries are patent  within the neck. No visible dissection. No hemodynamically significant stenosis about the left carotid artery system. Vertebral arteries: Both vertebral arteries arise from the subclavian arteries. Left vertebral artery dominant. Vertebral arteries are patent without visible dissection or stenosis. Skeleton: No worrisome osseous lesions. Other neck: No other acute abnormality within the neck. Upper chest: Emphysema. Irregular biapical pleuroparenchymal pleural based right upper lobe mass, consistent with patient's known lung cancer. Scattered enlarged mediastinal, hilar, and right axillary lymph nodes noted, compatible with nodal metastatic disease. Findings better evaluated on concomitant CT of the chest. Review of the MIP images confirms the above findings CTA HEAD FINDINGS Anterior circulation: Both internal carotid arteries widely patent to the termini without stenosis. A1 segments widely patent. Normal anterior communicating artery complex. Both anterior cerebral arteries widely patent to their distal aspects without stenosis. No M1 stenosis or occlusion. Normal MCA bifurcations. Distal MCA branches well perfused and symmetric. Posterior circulation: Left V4 segment dominant and widely patent to the vertebrobasilar junction. Right vertebral artery terminates in PICA. Both PICA patent. Basilar patent without stenosis. Superior cerebellar and posterior cerebral arteries patent bilaterally. Venous sinuses: Patent allowing for timing the contrast bolus. Anatomic variants: As above.  No aneurysm. Review of the MIP images confirms the above findings IMPRESSION: CT HEAD: 1. Extensive vasogenic edema throughout the left frontal lobe, consistent with known mass in this region. The mass itself is not well visualized on this noncontrast examination. Associated regional mass effect with 4 mm of left-to-right shift. 2. Postoperative changes from prior left frontal  craniotomy with small residual postoperative collection  subjacent to the craniotomy bone flap as above. 3. No other acute intracranial abnormality. CTA HEAD AND NECK: 1. Negative CTA of the head and neck. No large vessel occlusion or other emergent finding. 2. Atheromatous plaque about the right carotid bulb without hemodynamically significant stenosis. 3. Irregular pleural based right upper lobe mass, consistent with patient's known lung cancer. Scattered enlarged mediastinal, hilar, and right axillary lymph nodes, compatible with nodal metastatic disease. Findings better evaluated on concomitant CT of the chest. 4. Aortic Atherosclerosis (ICD10-I70.0) and Emphysema (ICD10-J43.9). Electronically Signed   By: Rise Mu M.D.   On: 12/12/2023 23:28   DG Pelvis Portable Result Date: 12/12/2023 CLINICAL DATA:  Lung cancer with metastasis to bone.  Left hip pain. EXAM: PORTABLE PELVIS 1-2 VIEWS COMPARISON:  PET CT 10/27/2023 FINDINGS: Patient's known left sacral lesion is radiographically occult, also CT occult on prior PET. No evidence of acute fracture. Both hips are normally located. Pubic symphysis and sacroiliac joints are congruent. IMPRESSION: 1. Patient's known left sacral lesion is radiographically occult. 2. No evidence of pathologic fracture. Electronically Signed   By: Narda Rutherford M.D.   On: 12/12/2023 22:06   DG Chest Portable 1 View Result Date: 12/12/2023 CLINICAL DATA:  History of lung cancer with Mets to bone. Left hip pain. EXAM: PORTABLE CHEST 1 VIEW COMPARISON:  Chest radiograph 10/01/2023. CT 09/17/2023, PET 10/27/2023 FINDINGS: Stable heart size and mediastinal contours. Aortic atherosclerosis. Chronic lung disease with subpleural reticulation, peripheral and basilar predominant. Nodular airspace disease in the right lung apex is faintly visualized by radiograph. No evidence of focal bone lesion by radiograph. IMPRESSION: 1. No radiographic evidence of bone lesion. 2. Chronic lung disease. Nodule at the right lung apex is faintly  visualized by radiograph. Electronically Signed   By: Narda Rutherford M.D.   On: 12/12/2023 22:05   CT Lumbar Spine Wo Contrast Result Date: 12/06/2023 CLINICAL DATA:  Low back pain EXAM: CT LUMBAR SPINE WITHOUT CONTRAST TECHNIQUE: Multidetector CT imaging of the lumbar spine was performed without intravenous contrast administration. Multiplanar CT image reconstructions were also generated. RADIATION DOSE REDUCTION: This exam was performed according to the departmental dose-optimization program which includes automated exposure control, adjustment of the mA and/or kV according to patient size and/or use of iterative reconstruction technique. COMPARISON:  CT 09/17/2023 FINDINGS: Segmentation: Partial sacralization of the L5 vertebral body. Alignment: Normal Vertebrae: No acute fracture or focal pathologic process. Paraspinal and other soft tissues: Negative. 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 Disc levels: Maintained IMPRESSION: No acute bony abnormality. Electronically Signed   By: Charlett Nose M.D.   On: 12/06/2023 20:30   CT FEMUR LEFT WO CONTRAST Result Date: 12/06/2023 CLINICAL DATA:  Hip pain EXAM: CT OF THE LOWER LEFT EXTREMITY WITHOUT CONTRAST TECHNIQUE: Multidetector CT imaging of the lower left extremity was performed according to the standard protocol. RADIATION DOSE REDUCTION: This exam was performed according to the departmental dose-optimization program which includes automated exposure control, adjustment of the mA and/or kV according to patient size and/or use of iterative reconstruction technique. COMPARISON:  Plain films today. FINDINGS: Bones/Joint/Cartilage No acute bony abnormality. Specifically, no fracture, subluxation, or dislocation. No joint effusion. Ligaments Suboptimally assessed by CT. Muscles and Tendons Negative Soft tissues Negative IMPRESSION: No acute bony abnormality. Electronically Signed   By: Charlett Nose M.D.   On: 12/06/2023 20:27   DG Hip Unilat With Pelvis 2-3 Views  Left Result Date: 12/06/2023 CLINICAL DATA:  Hip pain EXAM: DG HIP (  WITH OR WITHOUT PELVIS) 2-3V LEFT COMPARISON:  None Available. FINDINGS: There is no evidence of hip fracture or dislocation. There is no evidence of arthropathy or other focal bone abnormality. IMPRESSION: Negative. Electronically Signed   By: Tyron Gallon M.D.   On: 12/06/2023 18:39   VAS US  UPPER EXTREMITY VENOUS DUPLEX Result Date: 11/21/2023 UPPER VENOUS STUDY  Patient Name:  LAKIAH DHINGRA  Date of Exam:   11/21/2023 Medical Rec #: [id number]       Accession #:    [id number] Date of Birth: 1965-10-11       Patient Gender: F Patient Age:   34 years Exam Location:  [facility] Procedure:      VAS US  UPPER EXTREMITY VENOUS DUPLEX Referring Phys: Paris Bolds --------------------------------------------------------------------------------  Indications: Pain, and Edema Risk Factors: Recent femur fracture with surgical repair (10/10/2023), recent suture removal (11/15/2023). Comparison Study: No previous exams Performing Technologist: Jody Hill RVT, RDMS  Examination Guidelines: A complete evaluation includes B-mode imaging, spectral Doppler, color Doppler, and power Doppler as needed of all accessible portions of each vessel. Bilateral testing is considered an integral part of a complete examination. Limited examinations for reoccurring indications may be performed as noted.  Right Findings: +----------+------------+---------+-----------+----------+--------------------+ RIGHT     CompressiblePhasicitySpontaneousProperties      Summary        +----------+------------+---------+-----------+----------+--------------------+ IJV           Full       No        Yes    pulsatile                      +----------+------------+---------+-----------+----------+--------------------+ Subclavian    Full       No        Yes    pulsatile                       +----------+------------+---------+-----------+----------+--------------------+ Axillary      Full       No        Yes    pulsatile                      +----------+------------+---------+-----------+----------+--------------------+ Brachial                 Yes       Yes                   patent by                                                              color/doppler     +----------+------------+---------+-----------+----------+--------------------+ Radial        Full                                                       +----------+------------+---------+-----------+----------+--------------------+ Ulnar  Not visualized    +----------+------------+---------+-----------+----------+--------------------+ Cephalic      Full                                                       +----------+------------+---------+-----------+----------+--------------------+ Basilic                                                Not visualized    +----------+------------+---------+-----------+----------+--------------------+ Unable to visualized basilic or ulnar veins due to patient inability to move arm and edema. Limited visualization of cephalic and brachial due to immobility and edema, areas visualized appear patent.  Left Findings: +----+------------+---------+-----------+----------+-----------------------+ LEFTCompressiblePhasicitySpontaneousProperties        Summary         +----+------------+---------+-----------+----------+-----------------------+ IJV                Yes       Yes              patent by color/doppler +----+------------+---------+-----------+----------+-----------------------+  Summary:  Right: No evidence of deep vein thrombosis in the upper extremity in areas visualized. No evidence of superficial vein thrombosis in the upper extremity in areas visualized. However, unable to visualize the basilic  and ulnar veins.  Left: No evidence of thrombosis in the subclavian.  *See table(s) above for measurements and observations.  Diagnosing physician: Gerarda Fraction Electronically signed by Gerarda Fraction on 11/21/2023 at 5:31:48 PM.    Final    DG Shoulder Right Result Date: 11/21/2023 CLINICAL DATA:  Right arm pain and swelling. EXAM: RIGHT HUMERUS - 2+ VIEW; RIGHT SHOULDER - 2+ VIEW COMPARISON:  None Available. FINDINGS: No acute fracture or dislocation. Right humerus ORIF noted fixated with 2 threaded screw proximally and 1 threaded screw distally. The hardware is intact. No periprosthetic fracture or lucency. However, there is permeative pattern in the humerus in the middle third shaft and there are large areas of bone destruction surrounding the hardware. Glenohumeral and acromioclavicular joints are normal in alignment and exhibit minimal degenerative changes. No soft tissue swelling. No radiopaque foreign bodies. IMPRESSION: *No acute fracture or dislocation. *Permeative pattern in the humerus in the middle third shaft and large areas of bone destruction surrounding the hardware. Findings are compatible with metastatic disease. Electronically Signed   By: Jules Schick M.D.   On: 11/21/2023 15:51   DG Humerus Right Result Date: 11/21/2023 CLINICAL DATA:  Right arm pain and swelling. EXAM: RIGHT HUMERUS - 2+ VIEW; RIGHT SHOULDER - 2+ VIEW COMPARISON:  None Available. FINDINGS: No acute fracture or dislocation. Right humerus ORIF noted fixated with 2 threaded screw proximally and 1 threaded screw distally. The hardware is intact. No periprosthetic fracture or lucency. However, there is permeative pattern in the humerus in the middle third shaft and there are large areas of bone destruction surrounding the hardware. Glenohumeral and acromioclavicular joints are normal in alignment and exhibit minimal degenerative changes. No soft tissue swelling. No radiopaque foreign bodies. IMPRESSION: *No acute fracture or  dislocation. *Permeative pattern in the humerus in the middle third shaft and large areas of bone destruction surrounding the hardware. Findings are compatible with metastatic disease. Electronically Signed   By: Jules Schick M.D.   On: 11/21/2023 15:51  IMPRESSION/PLAN: 1. 58 y.o. woman with***    I personally spent *** minutes in this encounter including chart review, reviewing radiological studies, meeting face-to-face with the patient, entering orders and completing documentation.    Arta Bihari, PA-C    Kenith Payer, MD  [facility]  Radiation Oncology Direct Dial: [phone]  Fax: [phone] [email or web address]  Skype  LinkedIn   This document serves as a record of services personally performed by Kenith Payer, MD and Keitha Pata, PA-C. It was created on their behalf by Florance Hun, a trained medical scribe. The creation of this record is based on the scribe's personal observations and the provider's statements to them. This document has been checked and approved by the attending provider.

## 2023-12-20 ENCOUNTER — Ambulatory Visit
Admission: RE | Admit: 2023-12-20 | Discharge: 2023-12-20 | Disposition: A | Discharge status: Home / Self Care | Payer: MEDICAID | Source: Ambulatory Visit | Attending: Urology | Admitting: Urology

## 2023-12-20 ENCOUNTER — Telehealth: Payer: Self-pay

## 2023-12-20 ENCOUNTER — Ambulatory Visit
Admit: 2023-12-20 | Discharge: 2023-12-20 | Disposition: A | Discharge status: Home / Self Care | Payer: MEDICAID | Attending: Radiation Oncology | Admitting: Radiation Oncology

## 2023-12-20 DIAGNOSIS — C7951 Secondary malignant neoplasm of bone: Secondary | ICD-10-CM

## 2023-12-20 DIAGNOSIS — G893 Neoplasm related pain (acute) (chronic): Secondary | ICD-10-CM | POA: Diagnosis not present

## 2023-12-20 LAB — CBC
HCT: 41.1 % (ref 36.0–46.0)
Hemoglobin: 13.3 g/dL (ref 12.0–15.0)
MCH: 28.3 pg (ref 26.0–34.0)
MCHC: 32.4 g/dL (ref 30.0–36.0)
MCV: 87.4 fL (ref 80.0–100.0)
Platelets: 480 10*3/uL — ABNORMAL HIGH (ref 150–400)
RBC: 4.7 MIL/uL (ref 3.87–5.11)
RDW: 15.7 % — ABNORMAL HIGH (ref 11.5–15.5)
WBC: 13.3 10*3/uL — ABNORMAL HIGH (ref 4.0–10.5)
nRBC: 0 % (ref 0.0–0.2)

## 2023-12-20 LAB — BASIC METABOLIC PANEL WITH GFR
Anion gap: 7 (ref 5–15)
BUN: 13 mg/dL (ref 6–20)
CO2: 25 mmol/L (ref 22–32)
Calcium: 9.3 mg/dL (ref 8.9–10.3)
Chloride: 99 mmol/L (ref 98–111)
Creatinine, Ser: 0.85 mg/dL (ref 0.44–1.00)
GFR, Estimated: >60 mL/min (ref >60)
Glucose, Bld: 117 mg/dL — ABNORMAL HIGH (ref 70–99)
Potassium: 4.3 mmol/L (ref 3.5–5.1)
Sodium: 131 mmol/L — ABNORMAL LOW (ref 135–145)

## 2023-12-20 MED ORDER — HYDROMORPHONE HCL 1 MG/ML IJ SOLN
1.0000 mg | INTRAMUSCULAR | Status: DC | PRN
  Administered 2023-12-20 – 2023-12-27 (×36): 1 mg via INTRAVENOUS
  Filled 2023-12-20 (×37): qty 1

## 2023-12-20 MED ORDER — HYDROMORPHONE HCL 1 MG/ML IJ SOLN
1.0000 mg | Freq: Once | INTRAMUSCULAR | Status: AC
  Administered 2023-12-20: 1 mg via INTRAVENOUS
  Filled 2023-12-20: qty 1

## 2023-12-20 MED ORDER — IPRATROPIUM-ALBUTEROL 0.5-2.5 (3) MG/3ML IN SOLN: 3.0000 mL | Freq: Four times a day (QID) | RESPIRATORY_TRACT | Status: DC | PRN

## 2023-12-20 NOTE — Progress Notes (Signed)
  Radiation Oncology         [phone]) [phone] ________________________________  Name: Regina [name] MRN: [id number]  Date: 12/20/2023  DOB: 08/07/1966  SIMULATION AND TREATMENT PLANNING NOTE    ICD-10-CM   1. Metastasis to bone [facility])  C79.51       DIAGNOSIS:  58 yo woman with a painful left sacral metastasis from right upper lung cancer  NARRATIVE:  The patient was brought to the CT Simulation planning suite.  Identity was confirmed.  All relevant records and images related to the planned course of therapy were reviewed.  The patient freely provided informed written consent to proceed with treatment after reviewing the details related to the planned course of therapy. The consent form was witnessed and verified by the simulation staff.  Then, the patient was set-up in a stable reproducible  supine position for radiation therapy.  CT images were obtained.  Surface markings were placed.  The CT images were loaded into the planning software.  Then the target and avoidance structures were contoured.  Treatment planning then occurred.  The radiation prescription was entered and confirmed.  Then, I designed and supervised the construction of a total of 3 medically necessary complex treatment devices consisting of leg positioner and MLC apertures to cover the treated hip area.  I have requested : 3D Simulation  I have requested a DVH of the following structures: Rectum, Bladder, femoral heads and target.  PLAN:  The patient will receive 20 Gy in 4 fractions.  ________________________________  Trilby Fujisawa Lorri Rota, M.D.

## 2023-12-20 NOTE — Progress Notes (Signed)
 Daily Progress Note   Patient Name: Regina [name]       Date: 12/20/2023 DOB: 03-27-1966  Age: 58 y.o. MRN#: [id number] Attending Physician: Montey Apa, DO Primary Care Physician: Marius Siemens, NP Admit Date: 12/18/2023  Reason for Consultation/Follow-up: Non pain symptom management and Pain control  Subjective: Complains of uncontrolled pain in abdomen and generalized back discomfort.   Length of Stay: 1  Current Medications: Scheduled Meds:   acetaminophen  1,000 mg Oral Q6H   cyclobenzaprine  10 mg Oral QHS   dexamethasone  3 mg Oral Q8H   Followed by   Cecily Cohen ON 12/23/2023] dexamethasone  2 mg Oral Q12H   diclofenac Sodium  2 g Topical QID   levETIRAcetam  500 mg Oral BID   metoprolol tartrate  25 mg Oral BID   pantoprazole  40 mg Oral Daily   polyethylene glycol  17 g Oral Daily   senna  1 tablet Oral BID    Continuous Infusions:   PRN Meds: acetaminophen **OR** acetaminophen, albuterol, HYDROmorphone (DILAUDID) injection, ondansetron **OR** ondansetron (ZOFRAN) IV, [DISCONTINUED] oxyCODONE **OR** oxyCODONE, traZODone  Physical Exam         Awake alert Mild distress due to pain Able to feed herself breakfast  Vital Signs: BP (!) 143/98 (BP Location: Left Arm)   Pulse 78   Temp (!) 97.5 F (36.4 C) (Oral)   Resp 18   Ht 5\' 6"  (1.676 m)   Wt 53.9 kg   LMP 06/06/2010   SpO2 100%   BMI 19.18 kg/m  SpO2: SpO2: 100 % O2 Device: O2 Device: Room Air O2 Flow Rate:    Intake/output summary:  Intake/Output Summary (Last 24 hours) at 12/20/2023 1344 Last data filed at 12/20/2023 0831 Gross per 24 hour  Intake 360 ml  Output 200 ml  Net 160 ml   LBM: Last BM Date : 12/19/23 Baseline Weight: Weight: 53.9 kg Most recent weight: Weight: 53.9 kg        Palliative Assessment/Data:      Patient Active Problem List   Diagnosis Date Noted   Cancer-related breakthrough pain 12/19/2023   High risk medication use 12/19/2023   Non-small cell lung cancer (HCC) 12/19/2023   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   Medication management 12/19/2023   Cancer associated pain 12/19/2023  Acute metabolic encephalopathy 12/13/2023   Expressive aphasia 12/13/2023   SIRS (systemic inflammatory response syndrome) (HCC) 12/13/2023   Lactic acidosis 12/13/2023   History of COVID-19 12/13/2023   Metabolic acidosis with increased anion gap and accumulation of organic acids 12/13/2023   Pathologic fracture 11/22/2023   Hypokalemia 11/22/2023   Closed fracture of shaft of right humerus 10/10/2023   Pressure injury of skin 10/10/2023   Cerebral intracranial abscess 10/07/2023   Palliative care encounter 10/04/2023   Pathological fracture of right humerus with delayed healing 10/04/2023   Lung cancer metastatic to brain (HCC) 10/04/2023   Vasogenic brain edema (HCC) 10/04/2023   Counseling and coordination of care 10/04/2023   Protein-calorie malnutrition, severe 10/04/2023   Metastasis to brain [facility]) 10/03/2023   Metastasis to bone (HCC) 10/03/2023   Cerebral edema (HCC) 10/01/2023   Primary adenocarcinoma of upper lobe of right lung (HCC) 09/22/2023   Pathological fracture in neoplastic disease, right humerus, initial encounter for fracture 09/19/2023   Neoplasm causing mass effect and brain compression on adjacent structures (HCC) 09/18/2023   COPD (chronic obstructive pulmonary disease) (HCC)    Anxiety    Hyponatremia 04/16/2018   Depression 04/16/2018   Hypertensive urgency    HTN (hypertension) 01/13/2016   Right anterior shoulder pain 07/18/2014   Postconcussion syndrome 05/28/2014    Palliative Care Assessment & Plan   Patient Profile:    Assessment:  Patient is a 58 year old female with a past medical history of 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  Of note patient had recent admission for 4/7-10 for management of encephalopathy related to cerebral edema from metastatic disease.  Patient initially refusing admission upon presentation to ER.  Palliative medicine team consulted to assist with complex medical decision making. Of note patient has been seen by palliative medicine team during prior admissions.  Patient had also been referred to outpatient palliative medicine follow-up at [facility] though is noted to have difficulties getting to appointments as she assist with care of her autistic brother who lives with her.  Recommendations/Plan: Continue current pain and non pain medication regimen and monitor hospital course.    Goals of Care and Additional Recommendations: Limitations on Scope of Treatment: Full Scope Treatment  Code Status:    Code Status Orders  (From admission, onward)           Start     Ordered   12/19/23 1045  Full code  Continuous       Question:  By:  Answer:  Consent: discussion documented in EHR   12/19/23 1045           Code Status History     Date Active Date Inactive Code Status Order ID Comments User Context   12/13/2023 0737 12/15/2023 1719 Full Code [id number]  Lena Qualia, MD ED   12/13/2023 0019 12/13/2023 0737 Full Code [id number]  Howerter, Gattis Kass, DO ED   11/22/2023 1953 11/25/2023 1920 Full Code [id number]  Selene Dais, MD ED   10/01/2023 2235 10/14/2023 0046 Full Code [id number]  Corrinne Din, MD ED   09/17/2023 1938 09/20/2023 2053 Full Code [id number]  Walton Guppy, MD ED   01/30/2020 1157 01/30/2020 2002 Full Code [id number]  Tama Fails, PA-C ED  04/16/2018 0009  04/16/2018 1750 Full Code [id number]  Fidencio Hue, MD ED      Advance Directive Documentation    Flowsheet Row Most Recent Value  Type of Advance Directive Healthcare Power of Attorney, Living will  Pre-existing out of facility DNR order (yellow form or pink MOST form) --  "MOST" Form in Place? --       Prognosis:  Unable to determine  Discharge Planning: To Be Determined  Care plan was discussed with  patient.   Thank you for allowing the Palliative Medicine Team to assist in the care of this patient. Mod MDM     Greater than 50%  of this time was spent counseling and coordinating care related to the above assessment and plan.  Lujean Sake, MD  Please contact Palliative Medicine Team phone at [phone] for questions and concerns.

## 2023-12-20 NOTE — Plan of Care (Signed)
  Problem: Education: Goal: Knowledge of risk factors and measures for prevention of condition will improve Outcome: Progressing   Problem: Coping: Goal: Psychosocial and spiritual needs will be supported Outcome: Progressing   Problem: Respiratory: Goal: Will maintain a patent airway Outcome: Progressing Goal: Complications related to the disease process, condition or treatment will be avoided or minimized Outcome: Progressing   Problem: Activity: Goal: Risk for activity intolerance will decrease Outcome: Progressing   Problem: Nutrition: Goal: Adequate nutrition will be maintained Outcome: Progressing   Problem: Pain Managment: Goal: General experience of comfort will improve and/or be controlled Outcome: Progressing

## 2023-12-20 NOTE — Progress Notes (Signed)
  Progress Note   Patient: Regina [name] ZOX:[id number] DOB: 30-Aug-1966 DOA: 12/18/2023     1 DOS: the patient was seen and examined on 12/20/2023   Brief hospital course: 58 year old female with stage IV non-small cell of the lung with mets to brain, bone, LN's, s/p stereotactic radiation surgery January 2025, pathological fracture of the right humerus s/p nailing and radiation to the humerus (completed radiation 3/21), recent admission 4/7-4/10 with encephalopathy related to cerebral edema from mets;  additional history of COPD, HTN, who presented to the ED with severe pain esp in the Left hip and right shoulder.   Patient was admitted for management of intractable pain requiring IV medications.  Palliative care is consulted.  Radiation oncology was also notified and planning for palliative radiation therapy this admission.    Assessment and Plan:  Stage IV NSCLC with cancer related pain  Left hip pain, suspect from sacral met (CT occult, PET +)  R shoulder pain, known met with hx pathologic Fx, fixation, radiation.  --Palliative Care is following for symptom management and goals of care discusssions --Radiation Oncology following, plans to radiation to sacral mets this admission --Pain control per orders, adjust regimen as needed     History of cerebral edema from brain mets:  Continue dexamethasone taper  Seizure disorder:  --Continue Keppra  COPD: Appears not on inhalers outpatient --Duonebs PRN  Hypertension:  --Continue metoprolol  GERD:  --Continue PPI  Psychosocial -- pt's brother with Autism lives with her.  Per notes, she is working to attempt to get him to a facility.  Often caring for her brother causes difficulty for pt getting to her own appointments. --TOC consulted      Subjective: Pt seen awake resting in bed today.  Pain was still 10/10 after 15 mg oxycodone and 0.5 mg IV dilaudid, but now improved after additional dose of 1 mg IV dilaudid.  Pt reports  excruciating pain and difficulty finding any comfortable position.  Denies other complaints.    Physical Exam: Vitals:   12/19/23 2132 12/20/23 0539 12/20/23 1018 12/20/23 1512  BP: (!) 140/91 (!) 144/86 (!) 143/98 (!) 157/83  Pulse: 90 65 78 (!) 59  Resp: 18 18  15   Temp: (!) 97.3 F (36.3 C) (!) 97.5 F (36.4 C)  (!) 97.4 F (36.3 C)  TempSrc: Oral Oral  Oral  SpO2: 100% 100% 100% 100%  Weight:      Height:       General exam: awake, alert, no acute distress, underweight HEENT: moist mucus membranes, hearing grossly normal  Respiratory system: CTAB, no wheezes, rales or rhonchi, normal respiratory effort. Cardiovascular system: normal S1/S2, RRR, no JVD, murmurs, rubs, gallops, no pedal edema.   Gastrointestinal system: soft, NT, ND, no HSM felt, +bowel sounds. Central nervous system: A&O x 3. no gross focal neurologic deficits, normal speech Extremities: moves all, no edema, normal tone Skin: dry, intact, normal temperature Psychiatry: normal mood, congruent affect, judgement and insight appear normal    Data Reviewed:  Notable labs -- Na 131, glucose 117 otherwise normal BMP CBC with WBC 15 >> 13.1, platelets 524 >> 480  Family Communication: None present. Pt updated in detail.  Disposition: Status is: Inpatient Remains inpatient appropriate because: requiring IV meds for pain control, inpatient radiation therapy is planned   Planned Discharge Destination: Home    Time spent: 45 minutes  Author: Pennie Banter, DO 12/20/2023 4:42 PM  For on call review [email or web address].

## 2023-12-20 NOTE — Transitions of Care (Post Inpatient/ED Visit) (Signed)
   12/20/2023  Name: Regina [name] MRN: [id number] DOB: 1966/08/08  Today's TOC FU Call Status:    Per EPIC patient readmitted - closing TOC program   Follow Up Plan: No further outreach attempts will be made at this time. We have been unable to contact the patient.    Goals Addressed             This Visit's Progress    [facility] Transitions of Care (TOC) Care Plan       Problems: per EPIC patient readmitted - closing program  Recent Hospitalization for treatment of Acute metabolic encephalopathy Unable to complete full Initial Assessment 12/16/23 - Patient abruptly stopped talking and TOC RN could not get patient to respond, Patient had told TOC RN she was home alone and after repeated attempts to get patient to respond, TOC RN requested Supervisor to call 911- After 911 was called and on their way, Patient came back on the line and said she switched over to take another call and had done so without making TOC RN aware- Patient voiced that she was upset that [facility] RN had someone call 911 and did not want to complete initial assessment/medication review today - patient requested call back another day Patient has [employer] and will need to be discussed with patient and [employer] notified with next call  Goal:  Over the next 30 days, the patient will not experience hospital readmission  Interventions: per EPIC patient readmitted - closing program  Transitions of Care: Doctor Visits  - discussed the importance of doctor visits Arranged PCP follow-up within 12-14 days (Care Guide Scheduled)  Oncology: Assessment of understanding of oncology diagnosis:  Reviewed upcoming provider appointments and treatment appointments Assessed available transportation to appointments and treatments. Has consistent/reliable transportation: Yes  Patient Self Care Activities:  Attend all scheduled provider appointments Call pharmacy for medication refills 3-7 days in advance of running out of  medications Call provider office for new concerns or questions  Notify RN Care Manager of TOC call rescheduling needs Participate in Transition of Care Program/Attend TOC scheduled calls Take medications as prescribed    Plan: per EPIC patient readmitted - closing program  Next PCP & Radiation Oncology appointments scheduled for: 12/27/23 Telephone follow up appointment with care management team member: patient ended call on 12/16/23 and requested a call another day  The patient has been provided with contact information for the care management team and has been advised to call with any health related questions or concerns.         Tonia Frankel RN, CCM   [facility]Population Health RN Care Manager [phone]

## 2023-12-20 NOTE — Plan of Care (Signed)

## 2023-12-21 ENCOUNTER — Other Ambulatory Visit: Payer: Self-pay

## 2023-12-21 ENCOUNTER — Ambulatory Visit
Admit: 2023-12-21 | Discharge: 2023-12-21 | Disposition: A | Discharge status: Home / Self Care | Payer: MEDICAID | Attending: Radiation Oncology | Admitting: Radiation Oncology

## 2023-12-21 ENCOUNTER — Other Ambulatory Visit: Payer: 59

## 2023-12-21 ENCOUNTER — Ambulatory Visit: Payer: 59

## 2023-12-21 ENCOUNTER — Ambulatory Visit: Payer: 59 | Admitting: Physician Assistant

## 2023-12-21 DIAGNOSIS — G893 Neoplasm related pain (acute) (chronic): Secondary | ICD-10-CM | POA: Diagnosis not present

## 2023-12-21 LAB — CBC
HCT: 42.9 % (ref 36.0–46.0)
Hemoglobin: 13.7 g/dL (ref 12.0–15.0)
MCH: 28.2 pg (ref 26.0–34.0)
MCHC: 31.9 g/dL (ref 30.0–36.0)
MCV: 88.5 fL (ref 80.0–100.0)
Platelets: 453 10*3/uL — ABNORMAL HIGH (ref 150–400)
RBC: 4.85 MIL/uL (ref 3.87–5.11)
RDW: 16.1 % — ABNORMAL HIGH (ref 11.5–15.5)
WBC: 16.5 10*3/uL — ABNORMAL HIGH (ref 4.0–10.5)
nRBC: 0 % (ref 0.0–0.2)

## 2023-12-21 LAB — RAD ONC ARIA SESSION SUMMARY
Course Elapsed Days: 0
Plan Fractions Treated to Date: 1
Plan Prescribed Dose Per Fraction: 5 Gy
Plan Total Fractions Prescribed: 4
Plan Total Prescribed Dose: 20 Gy
Reference Point Dosage Given to Date: 5 Gy
Reference Point Session Dosage Given: 5 Gy
Session Number: 1

## 2023-12-21 LAB — PHOSPHORUS: Phosphorus: 3.4 mg/dL (ref 2.5–4.6)

## 2023-12-21 LAB — BASIC METABOLIC PANEL WITH GFR
Anion gap: 10 (ref 5–15)
BUN: 14 mg/dL (ref 6–20)
CO2: 22 mmol/L (ref 22–32)
Calcium: 9.4 mg/dL (ref 8.9–10.3)
Chloride: 99 mmol/L (ref 98–111)
Creatinine, Ser: 0.77 mg/dL (ref 0.44–1.00)
GFR, Estimated: >60 mL/min (ref >60)
Glucose, Bld: 98 mg/dL (ref 70–99)
Potassium: 4.5 mmol/L (ref 3.5–5.1)
Sodium: 131 mmol/L — ABNORMAL LOW (ref 135–145)

## 2023-12-21 LAB — MAGNESIUM: Magnesium: 2.1 mg/dL (ref 1.7–2.4)

## 2023-12-21 NOTE — Progress Notes (Signed)
 Daily Progress Note   Patient Name: Regina [name]       Date: 12/21/2023 DOB: 05-13-1966  Age: 58 y.o. MRN#: [id number] Attending Physician: Audria Leather, MD Primary Care Physician: Marius Siemens, NP Admit Date: 12/18/2023  Reason for Consultation/Follow-up: Non pain symptom management and Pain control  Subjective: Complains of pain and discomfort in both of her hip joints, she recalls meeting with radiation oncology colleagues and is aware that for radiation treatments will be offered beginning today.  Length of Stay: 2  Current Medications: Scheduled Meds:   acetaminophen  1,000 mg Oral Q6H   cyclobenzaprine  10 mg Oral QHS   dexamethasone  3 mg Oral Q8H   Followed by   Cecily Cohen ON 12/23/2023] dexamethasone  2 mg Oral Q12H   diclofenac Sodium  2 g Topical QID   levETIRAcetam  500 mg Oral BID   metoprolol tartrate  25 mg Oral BID   pantoprazole  40 mg Oral Daily   polyethylene glycol  17 g Oral Daily   senna  1 tablet Oral BID    Continuous Infusions:   PRN Meds: acetaminophen **OR** acetaminophen, HYDROmorphone (DILAUDID) injection, ipratropium-albuterol, ondansetron **OR** ondansetron (ZOFRAN) IV, [DISCONTINUED] oxyCODONE **OR** oxyCODONE, traZODone  Physical Exam         Awake alert Mild distress due to pain Resting in bed  Vital Signs: BP (!) 151/91 (BP Location: Left Arm)   Pulse 71   Temp 98.2 F (36.8 C) (Oral)   Resp 18   Ht 5\' 6"  (1.676 m)   Wt 53.9 kg   LMP 06/06/2010   SpO2 99%   BMI 19.18 kg/m  SpO2: SpO2: 99 % O2 Device: O2 Device: Room Air O2 Flow Rate:    Intake/output summary:  Intake/Output Summary (Last 24 hours) at 12/21/2023 0827 Last data filed at 12/20/2023 1700 Gross per 24 hour  Intake 240 ml  Output 200 ml  Net 40 ml   LBM:  Last BM Date : 12/19/23 Baseline Weight: Weight: 53.9 kg Most recent weight: Weight: 53.9 kg       Palliative Assessment/Data:      Patient Active Problem List   Diagnosis Date Noted   Cancer-related breakthrough pain 12/19/2023   High risk medication use 12/19/2023   Non-small cell lung cancer (HCC) 12/19/2023   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) 12/19/2023   Medication management 12/19/2023   Cancer associated pain 12/19/2023   Acute metabolic encephalopathy 12/13/2023   Expressive aphasia 12/13/2023   SIRS (systemic inflammatory response syndrome) (HCC) 12/13/2023   Lactic acidosis 12/13/2023   History of COVID-19 12/13/2023   Metabolic acidosis with increased anion gap and accumulation of organic acids 12/13/2023   Pathologic fracture 11/22/2023   Hypokalemia 11/22/2023   Closed fracture of shaft of right humerus 10/10/2023   Pressure injury of skin 10/10/2023   Cerebral intracranial abscess 10/07/2023   Palliative care encounter 10/04/2023   Pathological fracture of right humerus with delayed healing 10/04/2023   Lung cancer metastatic to brain (HCC) 10/04/2023   Vasogenic brain edema (HCC) 10/04/2023   Counseling and coordination of care 10/04/2023   Protein-calorie malnutrition, severe 10/04/2023   Metastasis to brain [facility]) 10/03/2023   Metastasis to bone (HCC) 10/03/2023   Cerebral edema (HCC) 10/01/2023   Primary adenocarcinoma of upper lobe of right lung (HCC) 09/22/2023   Pathological fracture in neoplastic disease, right humerus, initial encounter for fracture 09/19/2023   Neoplasm causing mass effect and brain compression on adjacent structures (HCC) 09/18/2023   COPD (chronic obstructive pulmonary disease) (HCC)    Anxiety    Hyponatremia 04/16/2018   Depression 04/16/2018   Hypertensive urgency    HTN (hypertension) 01/13/2016   Right anterior shoulder pain 07/18/2014   Postconcussion syndrome 05/28/2014    Palliative Care Assessment & Plan   Patient  Profile:    Assessment:  Patient is a 59 year old female with a past medical history of 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  Of note patient had recent admission for 4/7-10 for management of encephalopathy related to cerebral edema from metastatic disease.  Patient initially refusing admission upon presentation to ER.  Palliative medicine team consulted to assist with complex medical decision making. Of note patient has been seen by palliative medicine team during prior admissions.  Patient had also been referred to outpatient palliative medicine follow-up at [facility] though is noted to have difficulties getting to appointments as she assist with care of her autistic brother who lives with her.  Recommendations/Plan: Continue current pain and non pain medication regimen and monitor hospital course.   Radiation oncology note reviewed, appreciate their input and assessment and recommendations, for radiation treatments to be attempted for-16 through 12-26-23.  Goals of Care and Additional Recommendations: Limitations on Scope of Treatment: Full Scope Treatment  Code Status:    Code Status Orders  (From admission, onward)           Start     Ordered   12/19/23 1045  Full code  Continuous       Question:  By:  Answer:  Consent: discussion documented in EHR   12/19/23 1045           Code Status History     Date Active Date Inactive Code Status Order ID Comments User Context   12/13/2023 0737 12/15/2023 1719 Full Code [id number]  Clydie Braun, MD ED   12/13/2023 0019 12/13/2023 0737 Full Code [id number]  Angie Fava, DO ED   11/22/2023 1953 11/25/2023 1920 Full Code [id number]  [name], MD ED   10/01/2023 2235 10/14/2023 0046 Full Code  [id number]  Corrinne Din, MD ED   09/17/2023 1938 09/20/2023 2053 Full Code [id number]  Walton Guppy, MD ED   01/30/2020 1157 01/30/2020 2002 Full Code [id number]  Tama Fails, PA-C ED   04/16/2018 0009 04/16/2018 1750 Full Code [id number]  Fidencio Hue, MD ED      Advance Directive Documentation    Flowsheet Row Most Recent Value  Type of Advance Directive Healthcare Power of Attorney, Living will  Pre-existing out of facility DNR order (yellow form or pink MOST form) --  "MOST" Form in Place? --       Prognosis:  Unable to determine  Discharge Planning: To Be Determined  Care plan was discussed with  patient.   Thank you for allowing the Palliative Medicine Team to assist in the care of this patient. Mod MDM     Greater than 50%  of this time was spent counseling and coordinating care related to the above assessment and plan.  Lujean Sake, MD  Please contact Palliative Medicine Team phone at [phone] for questions and concerns.

## 2023-12-21 NOTE — Evaluation (Addendum)
 Physical Therapy Evaluation Patient Details Name: Regina [name] MRN: [id number] DOB: [birth year] Today's Date: 12/21/2023  History of Present Illness  58 year old female who presented to the ED with severe pain esp in the Left hip and right shoulder. Pt with stage IV non-small cell of the lung with mets to brain, bone, LN's, s/p stereotactic radiation surgery January 2025, pathological fracture of the right humerus s/p nailing 10/10/23 and radiation to the humerus (completed radiation 3/21), recent admission 4/7-4/10 with covid and encephalopathy related to cerebral edema from mets;  additional history of COPD, HTN.  Clinical Impression  Pt is mobilizing well at an independent level, she ambulated 160' without an assistive device, no loss of balance. No further PT indicated. OT order placed to RUE, which is currently in a poorly fitting sling.         If plan is discharge home, recommend the following: Assist for transportation   Can travel by private vehicle        Equipment Recommendations None recommended by PT  Recommendations for Other Services  OT consult    Functional Status Assessment Patient has not had a recent decline in their functional status     Precautions / Restrictions Precautions Precautions: Other (comment) Recall of Precautions/Restrictions: Intact Precaution/Restrictions Comments: pt has sling on RUE; denies falls in past 6 months Required Braces or Orthoses: Sling Restrictions Weight Bearing Restrictions Per Provider Order: No      Mobility  Bed Mobility               General bed mobility comments: sitting up at edge of bed    Transfers Overall transfer level: Independent Equipment used: None Transfers: Sit to/from Stand Sit to Stand: Independent                Ambulation/Gait Ambulation/Gait assistance: Independent Gait Distance (Feet): 160 Feet Assistive device: None Gait Pattern/deviations: WFL(Within Functional Limits) Gait  velocity: WFL     General Gait Details: steady, no loss of balance  Stairs            Wheelchair Mobility     Tilt Bed    Modified Rankin (Stroke Patients Only)       Balance Overall balance assessment: Needs assistance   Sitting balance-Leahy Scale: Good     Standing balance support: No upper extremity supported, During functional activity Standing balance-Leahy Scale: Good                               Pertinent Vitals/Pain Pain Assessment Faces Pain Scale: Hurts even more (pt did not respond when asked for pain scale rating) Pain Location: "all over" Pain Descriptors / Indicators: Grimacing, Guarding, Discomfort Pain Intervention(s): Limited activity within patient's tolerance, Monitored during session, Premedicated before session    Home Living Family/patient expects to be discharged to:: Private residence Living Arrangements: Other relatives (brother and son) Available Help at Discharge: Family;Available 24 hours/day Type of Home: Apartment Home Access: Level entry       Home Layout: One level Home Equipment: Shower seat Additional Comments: Pt lives with son and brother, 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    Prior Function Prior Level of Function : Independent/Modified Independent;Driving             Mobility Comments: Ambulatory without AD, denies any other falls ADLs Comments: Per chart, pt works at [employer], pt reports she has not returned to work yet  Extremity/Trunk Assessment   Upper Extremity Assessment Upper Extremity Assessment: Defer to OT evaluation RUE Deficits / Details: recent R humeral fx and surgery, RUE in sling    Lower Extremity Assessment Lower Extremity Assessment: Overall WFL for tasks assessed    Cervical / Trunk Assessment Cervical / Trunk Assessment: Normal  Communication   Communication Factors Affecting Communication: Reduced clarity of speech    Cognition Arousal:  Alert Behavior During Therapy: WFL for tasks assessed/performed   PT - Cognitive impairments: Difficult to assess                       PT - Cognition Comments: pt resistant to answering questions Following commands: Intact       Cueing       General Comments      Exercises     Assessment/Plan    PT Assessment Patient does not need any further PT services  PT Problem List         PT Treatment Interventions      PT Goals (Current goals can be found in the Care Plan section)  Acute Rehab PT Goals Patient Stated Goal: none stated PT Goal Formulation: All assessment and education complete, DC therapy    Frequency       Co-evaluation               AM-PAC PT "6 Clicks" Mobility  Outcome Measure Help needed turning from your back to your side while in a flat bed without using bedrails?: None Help needed moving from lying on your back to sitting on the side of a flat bed without using bedrails?: None Help needed moving to and from a bed to a chair (including a wheelchair)?: None Help needed standing up from a chair using your arms (e.g., wheelchair or bedside chair)?: None Help needed to walk in hospital room?: None Help needed climbing 3-5 steps with a railing? : None 6 Click Score: 24    End of Session   Activity Tolerance: Patient tolerated treatment well Patient left: in bed;with call bell/phone within reach Nurse Communication: Mobility status      Time: 1610-9604 PT Time Calculation (min) (ACUTE ONLY): 10 min   Charges:   PT Evaluation $PT Eval Low Complexity: 1 Low   PT General Charges $$ ACUTE PT VISIT: 1 Visit         Daymon Hagemann PT 12/21/2023  Acute Rehabilitation Services  Office [phone]

## 2023-12-21 NOTE — Progress Notes (Signed)
 PROGRESS NOTE    Regina [name]  WUJ:[id number] DOB: January 03, 1966 DOA: 12/18/2023 PCP: [name], NP   Brief Narrative:  58 year old female with stage IV non-small cell of the lung with mets to brain, bone, LN's, s/p stereotactic radiation surgery January 2025, pathological fracture of the right humerus s/p nailing and radiation to the humerus (completed radiation 3/21), recent admission 4/7-4/10 with encephalopathy related to cerebral edema from mets;  additional history of COPD, HTN, who presented to the ED with severe pain esp in the Left hip and right shoulder.   Patient was admitted for management of intractable pain requiring IV medications.  Palliative care is consulted.  Radiation oncology was also notified and planning for palliative radiation therapy this admission.     Assessment & Plan:   Stage IV NSCLC with cancer related pain  Left hip pain, suspect from sacral met (CT occult, PET +)  R shoulder pain, known met with hx pathologic Fx, fixation, radiation.  --Palliative Care is following for symptom management and goals of care discusssions --Radiation Oncology following: plans to start radiation to sacral mets from today - Continue Decadron    History of cerebral edema from brain mets:  -Continue dexamethasone taper   Seizure disorder:  --Continue Keppra   COPD: Appears not on inhalers outpatient --Duonebs PRN   Hypertension:  -- Blood pressure intermittently on the higher side.  Continue metoprolol.  Might have to increase the dose of metoprolol.   GERD:  --Continue PPI   Psychosocial -- pt's brother with Autism lives with her.  Per notes, she is working to attempt to get him to a facility.  Often caring for her brother causes difficulty for pt getting to her own appointments. --TOC consulted   DVT prophylaxis: SCDs Code Status: Full Family Communication: None at bedside Disposition Plan: Status is: Inpatient Remains inpatient appropriate because: Of  severity of illness    Consultants: Palliative care/radiation oncology  Procedures: None  Antimicrobials: None   Subjective: Patient seen and examined at bedside.  Poor historian, slow to respond.  No seizures, vomiting, agitation reported.  Objective: Vitals:   12/20/23 1018 12/20/23 1512 12/20/23 2012 12/21/23 0510  BP: (!) 143/98 (!) 157/83 118/77 (!) 151/91  Pulse: 78 (!) 59 85 71  Resp:  15 18 18   Temp:  (!) 97.4 F (36.3 C) 99.3 F (37.4 C) 98.2 F (36.8 C)  TempSrc:  Oral Oral Oral  SpO2: 100% 100% 94% 99%  Weight:      Height:        Intake/Output Summary (Last 24 hours) at 12/21/2023 1032 Last data filed at 12/20/2023 1700 Gross per 24 hour  Intake 240 ml  Output --  Net 240 ml   Filed Weights   12/18/23 1724  Weight: 53.9 kg    Examination:  General exam: Appears calm and comfortable.  Looks chronically ill and deconditioned. Respiratory system: Bilateral decreased breath sounds at bases Cardiovascular system: S1 & S2 heard, Rate controlled Gastrointestinal system: Abdomen is nondistended, soft and nontender. Normal bowel sounds heard. Extremities: No cyanosis, clubbing, edema  Central nervous system: Awake, slow to respond, poor historian.  No focal neurological deficits. Moving extremities Skin: No rashes, lesions or ulcers Psychiatry: Flat affect.  Not agitated.   Data Reviewed: I have personally reviewed following labs and imaging studies  CBC: Recent Labs  Lab 12/18/23 1745 12/20/23 0550 12/21/23 0538  WBC 15.0* 13.3* 16.5*  HGB 14.7 13.3 13.7  HCT 43.8 41.1 42.9  MCV  84.2 87.4 88.5  PLT 524* 480* 453*   Basic Metabolic Panel: Recent Labs  Lab 12/18/23 1745 12/20/23 0550 12/21/23 0538  NA 132* 131* 131*  K 3.0* 4.3 4.5  CL 97* 99 99  CO2 21* 25 22  GLUCOSE 100* 117* 98  BUN 8 13 14   CREATININE 0.73 0.85 0.77  CALCIUM 9.4 9.3 9.4  MG  --   --  2.1  PHOS  --   --  3.4   GFR: Estimated Creatinine Clearance: 63.6 mL/min  (by C-G formula based on SCr of 0.77 mg/dL). Liver Function Tests: Recent Labs  Lab 12/18/23 1745  AST 26  ALT 17  ALKPHOS 59  BILITOT 0.4  PROT 7.2  ALBUMIN 3.8   No results for input(s): "LIPASE", "AMYLASE" in the last 168 hours. No results for input(s): "AMMONIA" in the last 168 hours. Coagulation Profile: No results for input(s): "INR", "PROTIME" in the last 168 hours. Cardiac Enzymes: No results for input(s): "CKTOTAL", "CKMB", "CKMBINDEX", "TROPONINI" in the last 168 hours. BNP (last 3 results) No results for input(s): "PROBNP" in the last 8760 hours. HbA1C: No results for input(s): "HGBA1C" in the last 72 hours. CBG: No results for input(s): "GLUCAP" in the last 168 hours. Lipid Profile: No results for input(s): "CHOL", "HDL", "LDLCALC", "TRIG", "CHOLHDL", "LDLDIRECT" in the last 72 hours. Thyroid Function Tests: No results for input(s): "TSH", "T4TOTAL", "FREET4", "T3FREE", "THYROIDAB" in the last 72 hours. Anemia Panel: No results for input(s): "VITAMINB12", "FOLATE", "FERRITIN", "TIBC", "IRON", "RETICCTPCT" in the last 72 hours. Sepsis Labs: No results for input(s): "PROCALCITON", "LATICACIDVEN" in the last 168 hours.  Recent Results (from the past 240 hours)  Resp panel by RT-PCR (RSV, Flu A&B, Covid) Urine, Catheterized     Status: Abnormal   Collection Time: 12/12/23  5:52 PM   Specimen: Urine, Catheterized; Nasal Swab  Result Value Ref Range Status   SARS Coronavirus 2 by RT PCR POSITIVE (A) NEGATIVE Final   Influenza A by PCR NEGATIVE NEGATIVE Final   Influenza B by PCR NEGATIVE NEGATIVE Final    Comment: (NOTE) The Xpert Xpress SARS-CoV-2/FLU/RSV plus assay is intended as an aid in the diagnosis of influenza from Nasopharyngeal swab specimens and should not be used as a sole basis for treatment. Nasal washings and aspirates are unacceptable for Xpert Xpress SARS-CoV-2/FLU/RSV testing.  Fact Sheet for  Patients: BloggerCourse.com  Fact Sheet for Healthcare Providers: SeriousBroker.it  This test is not yet approved or cleared by the [place] FDA and has been authorized for detection and/or diagnosis of SARS-CoV-2 by FDA under an Emergency Use Authorization (EUA). This EUA will remain in effect (meaning this test can be used) for the duration of the COVID-19 declaration under Section 564(b)(1) of the Act, 21 U.S.C. section 360bbb-3(b)(1), unless the authorization is terminated or revoked.     Resp Syncytial Virus by PCR NEGATIVE NEGATIVE Final    Comment: (NOTE) Fact Sheet for Patients: BloggerCourse.com  Fact Sheet for Healthcare Providers: SeriousBroker.it  This test is not yet approved or cleared by the [place] FDA and has been authorized for detection and/or diagnosis of SARS-CoV-2 by FDA under an Emergency Use Authorization (EUA). This EUA will remain in effect (meaning this test can be used) for the duration of the COVID-19 declaration under Section 564(b)(1) of the Act, 21 U.S.C. section 360bbb-3(b)(1), unless the authorization is terminated or revoked.  Performed at [facility], [address] [place], [place] [address]   Culture, blood (single) w Reflex  to ID Panel     Status: None   Collection Time: 12/13/23  8:00 AM   Specimen: BLOOD LEFT HAND  Result Value Ref Range Status   Specimen Description BLOOD LEFT HAND  Final   Special Requests   Final    BOTTLES DRAWN AEROBIC AND ANAEROBIC Blood Culture results may not be optimal due to an inadequate volume of blood received in culture bottles   Culture   Final    NO GROWTH 5 DAYS Performed at [facility], [address], [place], [place] [address]    Report Status 12/18/2023 FINAL  Final         Radiology Studies: No results found.      Scheduled Meds:  acetaminophen  1,000 mg  Oral Q6H   cyclobenzaprine  10 mg Oral QHS   dexamethasone  3 mg Oral Q8H   Followed by   Cecily Cohen ON 12/23/2023] dexamethasone  2 mg Oral Q12H   diclofenac Sodium  2 g Topical QID   levETIRAcetam  500 mg Oral BID   metoprolol tartrate  25 mg Oral BID   pantoprazole  40 mg Oral Daily   polyethylene glycol  17 g Oral Daily   senna  1 tablet Oral BID   Continuous Infusions:        Audria Leather, MD [facility] 12/21/2023, 10:32 AM

## 2023-12-22 ENCOUNTER — Other Ambulatory Visit: Payer: Self-pay

## 2023-12-22 ENCOUNTER — Ambulatory Visit
Admit: 2023-12-22 | Discharge: 2023-12-22 | Disposition: A | Discharge status: Home / Self Care | Payer: MEDICAID | Attending: Radiation Oncology | Admitting: Radiation Oncology

## 2023-12-22 DIAGNOSIS — G893 Neoplasm related pain (acute) (chronic): Secondary | ICD-10-CM | POA: Diagnosis not present

## 2023-12-22 LAB — RAD ONC ARIA SESSION SUMMARY
Course Elapsed Days: 1
Plan Fractions Treated to Date: 2
Plan Prescribed Dose Per Fraction: 5 Gy
Plan Total Fractions Prescribed: 4
Plan Total Prescribed Dose: 20 Gy
Reference Point Dosage Given to Date: 10 Gy
Reference Point Session Dosage Given: 5 Gy
Session Number: 2

## 2023-12-22 MED ORDER — AMLODIPINE BESYLATE 5 MG PO TABS
5.0000 mg | ORAL_TABLET | Freq: Every day | ORAL | Status: DC
  Administered 2023-12-22 – 2023-12-27 (×6): 5 mg via ORAL
  Filled 2023-12-22 (×6): qty 1

## 2023-12-22 NOTE — Progress Notes (Signed)
 Daily Progress Note   Patient Name: Regina [name]       Date: 12/22/2023 DOB: 04-04-1966  Age: 58 y.o. MRN#: [id number] Attending Physician: Audria Leather, MD Primary Care Physician: Marius Siemens, NP Admit Date: 12/18/2023  Reason for Consultation/Follow-up: Non pain symptom management and Pain control  Subjective:   radiation treatments beginning today. +pain  Length of Stay: 3  Current Medications: Scheduled Meds:  . acetaminophen  1,000 mg Oral Q6H  . cyclobenzaprine  10 mg Oral QHS  . dexamethasone  3 mg Oral Q8H   Followed by  . [START ON 12/23/2023] dexamethasone  2 mg Oral Q12H  . diclofenac Sodium  2 g Topical QID  . levETIRAcetam  500 mg Oral BID  . metoprolol tartrate  25 mg Oral BID  . pantoprazole  40 mg Oral Daily  . polyethylene glycol  17 g Oral Daily  . senna  1 tablet Oral BID    Continuous Infusions:   PRN Meds: acetaminophen **OR** acetaminophen, HYDROmorphone (DILAUDID) injection, ipratropium-albuterol, ondansetron **OR** ondansetron (ZOFRAN) IV, [DISCONTINUED] oxyCODONE **OR** oxyCODONE, traZODone  Physical Exam         Awake alert Mild distress due to pain Resting in bed  Vital Signs: BP (!) 162/96   Pulse 89   Temp 97.9 F (36.6 C) (Oral)   Resp 18   Ht 5\' 6"  (1.676 m)   Wt 53.9 kg   LMP 06/06/2010   SpO2 100%   BMI 19.18 kg/m  SpO2: SpO2: 100 % O2 Device: O2 Device: Room Air O2 Flow Rate:    Intake/output summary:  Intake/Output Summary (Last 24 hours) at 12/22/2023 1206 Last data filed at 12/22/2023 0900 Gross per 24 hour  Intake 120 ml  Output --  Net 120 ml   LBM: Last BM Date : 12/19/23 Baseline Weight: Weight: 53.9 kg Most recent weight: Weight: 53.9 kg       Palliative Assessment/Data:      Patient Active  Problem List   Diagnosis Date Noted  . Cancer-related breakthrough pain 12/19/2023  . High risk medication use 12/19/2023  . Non-small cell lung cancer (HCC) 12/19/2023  . 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 Medication management 12/19/2023  . Cancer associated pain 12/19/2023  . Acute metabolic encephalopathy 12/13/2023  . Expressive aphasia 12/13/2023  . SIRS (  systemic inflammatory response syndrome) (HCC) 12/13/2023  . Lactic acidosis 12/13/2023  . History of COVID-19 12/13/2023  . Metabolic acidosis with increased anion gap and accumulation of organic acids 12/13/2023  . Pathologic fracture 11/22/2023  . Hypokalemia 11/22/2023  . Closed fracture of shaft of right humerus 10/10/2023  . Pressure injury of skin 10/10/2023  . Cerebral intracranial abscess 10/07/2023  . Palliative care encounter 10/04/2023  . Pathological fracture of right humerus with delayed healing 10/04/2023  . Lung cancer metastatic to brain (HCC) 10/04/2023  . Vasogenic brain edema (HCC) 10/04/2023  . Counseling and coordination of care 10/04/2023  . Protein-calorie malnutrition, severe 10/04/2023  . Metastasis to brain (HCC) 10/03/2023  . Metastasis to bone (HCC) 10/03/2023  . Cerebral edema (HCC) 10/01/2023  . Primary adenocarcinoma of upper lobe of right lung (HCC) 09/22/2023  . Pathological fracture in neoplastic disease, right humerus, initial encounter for fracture 09/19/2023  . Neoplasm causing mass effect and brain compression on adjacent structures (HCC) 09/18/2023  . COPD (chronic obstructive pulmonary disease) (HCC)   . Anxiety   . Hyponatremia 04/16/2018  . Depression 04/16/2018  . Hypertensive urgency   . HTN (hypertension) 01/13/2016  . Right anterior shoulder pain 07/18/2014  . 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  Of note patient had recent admission for 4/7-10 for management of encephalopathy related to cerebral edema from metastatic disease.  Patient initially refusing admission upon presentation to ER.  Palliative medicine team consulted to assist with complex medical decision making. Of note patient has been seen by palliative medicine team during prior admissions.  Patient had also been referred to outpatient palliative medicine follow-up at [facility] though is noted to have difficulties getting to appointments as she assist with care of her autistic brother who lives with her.  Recommendations/Plan: Continue current pain and non pain medication regimen and monitor hospital course.   Radiation oncology note reviewed, appreciate their input and assessment and recommendations, for radiation treatments to be attempted through 12-26-23.  Goals of Care and Additional Recommendations: Limitations on Scope of Treatment: Full Scope Treatment  Code Status:    Code Status Orders  (From admission, onward)           Start     Ordered   12/19/23 1045  Full code  Continuous       Question:  By:  Answer:  Consent: discussion documented in EHR   12/19/23 1045           Code Status History     Date Active Date Inactive Code Status Order ID Comments User Context   12/13/2023 0737 12/15/2023 1719 Full Code [id number]  Clydie Braun, MD ED   12/13/2023 0019 12/13/2023 0737 Full Code [id number]  Angie Fava, DO ED   11/22/2023 1953 11/25/2023 1920 Full Code [id number]  [name], MD ED   10/01/2023 2235 10/14/2023 0046 Full Code [id number]  Gery Pray, MD ED   09/17/2023 1938 09/20/2023 2053 Full  Code [id number]  Briscoe Deutscher, MD ED   01/30/2020 1157 01/30/2020 2002 Full Code  [id number]  Tama Fails, PA-C ED   04/16/2018 0009 04/16/2018 1750 Full Code [id number]  Fidencio Hue, MD ED      Advance Directive Documentation    Flowsheet Row Most Recent Value  Type of Advance Directive Healthcare Power of Attorney, Living will  Pre-existing out of facility DNR order (yellow form or pink MOST form) --  "MOST" Form in Place? --       Prognosis:  Unable to determine  Discharge Planning: To Be Determined  Care plan was discussed with  IDT  Thank you for allowing the Palliative Medicine Team to assist in the care of this patient. low MDM     Greater than 50%  of this time was spent counseling and coordinating care related to the above assessment and plan.  Lujean Sake, MD  Please contact Palliative Medicine Team phone at [phone] for questions and concerns.

## 2023-12-22 NOTE — Plan of Care (Signed)

## 2023-12-22 NOTE — Progress Notes (Signed)
 Mobility Specialist - Progress Note   12/22/23 1011  Mobility  Activity Ambulated independently in hallway  Level of Assistance Independent  Assistive Device None  Distance Ambulated (ft) 80 ft  Activity Response Tolerated well  Mobility Referral Yes  Mobility visit 1 Mobility  Mobility Specialist Start Time (ACUTE ONLY) 1005  Mobility Specialist Stop Time (ACUTE ONLY) 1011  Mobility Specialist Time Calculation (min) (ACUTE ONLY) 6 min   Pt received in bed and agreeable to mobility. Distance limited d/t pain. No complaints during session. Pt to bed after session with all needs met.    [facility]

## 2023-12-22 NOTE — Progress Notes (Signed)
 PROGRESS NOTE    Regina [name]  ZOX:[id number] DOB: 1966-07-25 DOA: 12/18/2023 PCP: [name], NP   Brief Narrative:  58 year old female with stage IV non-small cell of the lung with mets to brain, bone, LN's, s/p stereotactic radiation surgery January 2025, pathological fracture of the right humerus s/p nailing and radiation to the humerus (completed radiation 3/21), recent admission 4/7-4/10 with encephalopathy related to cerebral edema from mets;  additional history of COPD, HTN, who presented to the ED with severe pain esp in the Left hip and right shoulder.   Patient was admitted for management of intractable pain requiring IV medications.  Palliative care is consulted.  Radiation oncology was also notified and planning for palliative radiation therapy this admission.     Assessment & Plan:   Stage IV NSCLC with cancer related pain  Left hip pain, suspect from sacral met (CT occult, PET +)  R shoulder pain, known met with hx pathologic Fx, fixation, radiation.  --Palliative Care is following for symptom management and goals of care discusssions --Radiation Oncology following: Started radiation to sacral mets from 12/21/2023 - Continue Decadron    History of cerebral edema from brain mets:  -Continue dexamethasone taper   Seizure disorder:  --Continue Keppra   COPD: Appears not on inhalers outpatient --Duonebs PRN   Hypertension:  -- Blood pressure intermittently on the higher side.  Continue metoprolol.  Might have to increase the dose of metoprolol.   GERD:  --Continue PPI   Psychosocial -- pt's brother with Autism lives with her.  Per notes, she is working to attempt to get him to a facility.  Often caring for her brother causes difficulty for pt getting to her own appointments. --TOC consulted   DVT prophylaxis: SCDs Code Status: Full Family Communication: None at bedside Disposition Plan: Status is: Inpatient Remains inpatient appropriate because: Of  severity of illness    Consultants: Palliative care/radiation oncology  Procedures: None  Antimicrobials: None   Subjective: Patient seen and examined at bedside.  Poor historian, still slow to respond.  No agitation, fever, vomiting reported.  Objective: Vitals:   12/21/23 1345 12/21/23 2034 12/22/23 0428 12/22/23 0744  BP: (!) 172/100 (!) 169/85 (!) 163/88 (!) 158/88  Pulse: 67 66 63 76  Resp: 20 18 18    Temp: 98.4 F (36.9 C) 97.9 F (36.6 C) 97.9 F (36.6 C)   TempSrc: Oral Oral Oral   SpO2: 98% 100% 100%   Weight:      Height:        Intake/Output Summary (Last 24 hours) at 12/22/2023 0832 Last data filed at 12/21/2023 1206 Gross per 24 hour  Intake 480 ml  Output --  Net 480 ml   Filed Weights   12/18/23 1724  Weight: 53.9 kg    Examination:  General: On room air.  No distress.  Chronically ill and deconditioned looking. ENT/neck: No thyromegaly.  JVD is not elevated  respiratory: Decreased breath sounds at bases bilaterally with some crackles; no wheezing  CVS: S1-S2 heard, rate controlled currently Abdominal: Soft, nontender, slightly distended; no organomegaly, bowel sounds are heard Extremities: Trace lower extremity edema; no cyanosis  CNS: Awake and alert. Slow to respond and a poor historian.  No focal neurologic deficit.  Moves extremities Lymph: No obvious lymphadenopathy Skin: No obvious ecchymosis/lesions  psych: Mostly flat affect.  Currently not agitated.  Musculoskeletal: No obvious joint swelling/deformity    Data Reviewed: I have personally reviewed following labs and imaging studies  CBC:  Recent Labs  Lab 12/18/23 1745 12/20/23 0550 12/21/23 0538  WBC 15.0* 13.3* 16.5*  HGB 14.7 13.3 13.7  HCT 43.8 41.1 42.9  MCV 84.2 87.4 88.5  PLT 524* 480* 453*   Basic Metabolic Panel: Recent Labs  Lab 12/18/23 1745 12/20/23 0550 12/21/23 0538  NA 132* 131* 131*  K 3.0* 4.3 4.5  CL 97* 99 99  CO2 21* 25 22  GLUCOSE 100* 117* 98   BUN 8 13 14   CREATININE 0.73 0.85 0.77  CALCIUM 9.4 9.3 9.4  MG  --   --  2.1  PHOS  --   --  3.4   GFR: Estimated Creatinine Clearance: 63.6 mL/min (by C-G formula based on SCr of 0.77 mg/dL). Liver Function Tests: Recent Labs  Lab 12/18/23 1745  AST 26  ALT 17  ALKPHOS 59  BILITOT 0.4  PROT 7.2  ALBUMIN 3.8   No results for input(s): "LIPASE", "AMYLASE" in the last 168 hours. No results for input(s): "AMMONIA" in the last 168 hours. Coagulation Profile: No results for input(s): "INR", "PROTIME" in the last 168 hours. Cardiac Enzymes: No results for input(s): "CKTOTAL", "CKMB", "CKMBINDEX", "TROPONINI" in the last 168 hours. BNP (last 3 results) No results for input(s): "PROBNP" in the last 8760 hours. HbA1C: No results for input(s): "HGBA1C" in the last 72 hours. CBG: No results for input(s): "GLUCAP" in the last 168 hours. Lipid Profile: No results for input(s): "CHOL", "HDL", "LDLCALC", "TRIG", "CHOLHDL", "LDLDIRECT" in the last 72 hours. Thyroid Function Tests: No results for input(s): "TSH", "T4TOTAL", "FREET4", "T3FREE", "THYROIDAB" in the last 72 hours. Anemia Panel: No results for input(s): "VITAMINB12", "FOLATE", "FERRITIN", "TIBC", "IRON", "RETICCTPCT" in the last 72 hours. Sepsis Labs: No results for input(s): "PROCALCITON", "LATICACIDVEN" in the last 168 hours.  Recent Results (from the past 240 hours)  Resp panel by RT-PCR (RSV, Flu A&B, Covid) Urine, Catheterized     Status: Abnormal   Collection Time: 12/12/23  5:52 PM   Specimen: Urine, Catheterized; Nasal Swab  Result Value Ref Range Status   SARS Coronavirus 2 by RT PCR POSITIVE (A) NEGATIVE Final   Influenza A by PCR NEGATIVE NEGATIVE Final   Influenza B by PCR NEGATIVE NEGATIVE Final    Comment: (NOTE) The Xpert Xpress SARS-CoV-2/FLU/RSV plus assay is intended as an aid in the diagnosis of influenza from Nasopharyngeal swab specimens and should not be used as a sole basis for treatment. Nasal  washings and aspirates are unacceptable for Xpert Xpress SARS-CoV-2/FLU/RSV testing.  Fact Sheet for Patients: BloggerCourse.com  Fact Sheet for Healthcare Providers: SeriousBroker.it  This test is not yet approved or cleared by the [place] FDA and has been authorized for detection and/or diagnosis of SARS-CoV-2 by FDA under an Emergency Use Authorization (EUA). This EUA will remain in effect (meaning this test can be used) for the duration of the COVID-19 declaration under Section 564(b)(1) of the Act, 21 U.S.C. section 360bbb-3(b)(1), unless the authorization is terminated or revoked.     Resp Syncytial Virus by PCR NEGATIVE NEGATIVE Final    Comment: (NOTE) Fact Sheet for Patients: BloggerCourse.com  Fact Sheet for Healthcare Providers: SeriousBroker.it  This test is not yet approved or cleared by the [place] FDA and has been authorized for detection and/or diagnosis of SARS-CoV-2 by FDA under an Emergency Use Authorization (EUA). This EUA will remain in effect (meaning this test can be used) for the duration of the COVID-19 declaration under Section 564(b)(1) of the Act, 21 U.S.C. section 360bbb-3(b)(1), unless  the authorization is terminated or revoked.  Performed at [facility], [address] [place], [place] [address]   Culture, blood (single) w Reflex to ID Panel     Status: None   Collection Time: 12/13/23  8:00 AM   Specimen: BLOOD LEFT HAND  Result Value Ref Range Status   Specimen Description BLOOD LEFT HAND  Final   Special Requests   Final    BOTTLES DRAWN AEROBIC AND ANAEROBIC Blood Culture results may not be optimal due to an inadequate volume of blood received in culture bottles   Culture   Final    NO GROWTH 5 DAYS Performed at [facility], [address] [place], [place] [address]    Report Status 12/18/2023 FINAL  Final          Radiology Studies: No results found.      Scheduled Meds:  acetaminophen  1,000 mg Oral Q6H   cyclobenzaprine  10 mg Oral QHS   dexamethasone  3 mg Oral Q8H   Followed by   Cecily Cohen ON 12/23/2023] dexamethasone  2 mg Oral Q12H   diclofenac Sodium  2 g Topical QID   levETIRAcetam  500 mg Oral BID   metoprolol tartrate  25 mg Oral BID   pantoprazole  40 mg Oral Daily   polyethylene glycol  17 g Oral Daily   senna  1 tablet Oral BID   Continuous Infusions:        Audria Leather, MD [facility] 12/22/2023, 8:32 AM

## 2023-12-23 ENCOUNTER — Ambulatory Visit: Payer: MEDICAID

## 2023-12-23 ENCOUNTER — Telehealth: Payer: Self-pay | Admitting: Radiology

## 2023-12-23 ENCOUNTER — Ambulatory Visit
Admit: 2023-12-23 | Discharge: 2023-12-23 | Disposition: A | Discharge status: Home / Self Care | Payer: MEDICAID | Attending: Radiation Oncology | Admitting: Radiation Oncology

## 2023-12-23 ENCOUNTER — Encounter: Payer: Self-pay | Admitting: Radiology

## 2023-12-23 ENCOUNTER — Other Ambulatory Visit: Payer: Self-pay

## 2023-12-23 DIAGNOSIS — G893 Neoplasm related pain (acute) (chronic): Secondary | ICD-10-CM | POA: Diagnosis not present

## 2023-12-23 LAB — RAD ONC ARIA SESSION SUMMARY
Course Elapsed Days: 2
Plan Fractions Treated to Date: 3
Plan Prescribed Dose Per Fraction: 5 Gy
Plan Total Fractions Prescribed: 4
Plan Total Prescribed Dose: 20 Gy
Reference Point Dosage Given to Date: 15 Gy
Reference Point Session Dosage Given: 5 Gy
Session Number: 3

## 2023-12-23 LAB — BASIC METABOLIC PANEL WITH GFR
Anion gap: 10 (ref 5–15)
BUN: 19 mg/dL (ref 6–20)
CO2: 24 mmol/L (ref 22–32)
Calcium: 9.3 mg/dL (ref 8.9–10.3)
Chloride: 98 mmol/L (ref 98–111)
Creatinine, Ser: 0.76 mg/dL (ref 0.44–1.00)
GFR, Estimated: >60 mL/min (ref >60)
Glucose, Bld: 97 mg/dL (ref 70–99)
Potassium: 4.1 mmol/L (ref 3.5–5.1)
Sodium: 132 mmol/L — ABNORMAL LOW (ref 135–145)

## 2023-12-23 LAB — CBC WITH DIFFERENTIAL/PLATELET
Abs Immature Granulocytes: 0.21 10*3/uL — ABNORMAL HIGH (ref 0.00–0.07)
Basophils Absolute: 0 10*3/uL (ref 0.0–0.1)
Basophils Relative: 0 %
Eosinophils Absolute: 0 10*3/uL (ref 0.0–0.5)
Eosinophils Relative: 0 %
HCT: 43.2 % (ref 36.0–46.0)
Hemoglobin: 13.8 g/dL (ref 12.0–15.0)
Immature Granulocytes: 1 %
Lymphocytes Relative: 11 %
Lymphs Abs: 1.8 10*3/uL (ref 0.7–4.0)
MCH: 28.6 pg (ref 26.0–34.0)
MCHC: 31.9 g/dL (ref 30.0–36.0)
MCV: 89.6 fL (ref 80.0–100.0)
Monocytes Absolute: 1.1 10*3/uL — ABNORMAL HIGH (ref 0.1–1.0)
Monocytes Relative: 7 %
Neutro Abs: 13 10*3/uL — ABNORMAL HIGH (ref 1.7–7.7)
Neutrophils Relative %: 81 %
Platelets: 567 10*3/uL — ABNORMAL HIGH (ref 150–400)
RBC: 4.82 MIL/uL (ref 3.87–5.11)
RDW: 16.1 % — ABNORMAL HIGH (ref 11.5–15.5)
WBC: 16.1 10*3/uL — ABNORMAL HIGH (ref 4.0–10.5)
nRBC: 0 % (ref 0.0–0.2)

## 2023-12-23 LAB — MAGNESIUM: Magnesium: 1.9 mg/dL (ref 1.7–2.4)

## 2023-12-23 NOTE — Progress Notes (Signed)
 Daily Progress Note   Patient Name: Regina [name]       Date: 12/23/2023 DOB: November 10, 1965  Age: 58 y.o. MRN#: [id number] Attending Physician: Audria Leather, MD Primary Care Physician: Marius Siemens, NP Admit Date: 12/18/2023  Reason for Consultation/Follow-up: Non pain symptom management and Pain control  Subjective:  Patient wants to leave the hospital, she believes that her symptoms are not being managed well, I pulled up her medication history and reviewed it with her. I discussed with her about scheduled Tylenol  as well as the PO Oxycodone  IR PRN and the IV Dilaudid  PRN and she has been getting PRN opioids on an as needed basis. Spent some time validating the patient's pain and attempting trust building and a therapeutic relationship.  Recommend continuing radiation treatments   Length of Stay: 4  Current Medications: Scheduled Meds:   acetaminophen   1,000 mg Oral Q6H   amLODipine   5 mg Oral Daily   cyclobenzaprine   10 mg Oral QHS   dexamethasone   2 mg Oral Q12H   diclofenac  Sodium  2 g Topical QID   levETIRAcetam   500 mg Oral BID   metoprolol  tartrate  25 mg Oral BID   pantoprazole   40 mg Oral Daily   polyethylene glycol  17 g Oral Daily   senna  1 tablet Oral BID    Continuous Infusions:   PRN Meds: acetaminophen  **OR** acetaminophen , HYDROmorphone  (DILAUDID ) injection, ipratropium-albuterol , ondansetron  **OR** ondansetron  (ZOFRAN ) IV, [DISCONTINUED] oxyCODONE  **OR** oxyCODONE , traZODone   Physical Exam         Awake alert Mild distress due to pain Resting in bed  Vital Signs: BP (!) 158/89 (BP Location: Left Arm)   Pulse 70   Temp 98.4 F (36.9 C) (Oral)   Resp 14   Ht 5\' 6"  (1.676 m)   Wt 53.9 kg   LMP 06/06/2010   SpO2 100%   BMI 19.18 kg/m  SpO2:  SpO2: 100 % O2 Device: O2 Device: Room Air O2 Flow Rate:    Intake/output summary:  Intake/Output Summary (Last 24 hours) at 12/23/2023 1015 Last data filed at 12/22/2023 1500 Gross per 24 hour  Intake 120 ml  Output --  Net 120 ml   LBM: Last BM Date : 12/19/23 Baseline Weight: Weight: 53.9 kg Most recent weight: Weight: 53.9 kg  Palliative Assessment/Data:      Patient Active Problem List   Diagnosis Date Noted   Cancer-related breakthrough pain 12/19/2023   High risk medication use 12/19/2023   Non-small cell lung cancer (HCC) 12/19/2023   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   Medication management 12/19/2023   Cancer associated pain 12/19/2023   Acute metabolic encephalopathy 12/13/2023   Expressive aphasia 12/13/2023   SIRS (systemic inflammatory response syndrome) (HCC) 12/13/2023   Lactic acidosis 12/13/2023   History of COVID-19 12/13/2023   Metabolic acidosis with increased anion gap and accumulation of organic acids 12/13/2023   Pathologic fracture 11/22/2023   Hypokalemia 11/22/2023   Closed fracture of shaft of right humerus 10/10/2023   Pressure injury of skin 10/10/2023   Cerebral intracranial abscess 10/07/2023   Palliative care encounter 10/04/2023   Pathological fracture of right humerus with delayed healing 10/04/2023   Lung cancer metastatic to brain (HCC) 10/04/2023   Vasogenic brain edema (HCC) 10/04/2023   Counseling and coordination of care 10/04/2023   Protein-calorie malnutrition, severe 10/04/2023   Metastasis to brain (HCC) 10/03/2023   Metastasis to bone (HCC) 10/03/2023   Cerebral edema (HCC) 10/01/2023   Primary adenocarcinoma of upper lobe of right lung (HCC) 09/22/2023   Pathological fracture in neoplastic disease, right humerus, initial encounter for fracture 09/19/2023   Neoplasm causing mass effect and brain compression on adjacent structures (HCC) 09/18/2023   COPD (chronic obstructive pulmonary disease) (HCC)     Anxiety    Hyponatremia 04/16/2018   Depression 04/16/2018   Hypertensive urgency    HTN (hypertension) 01/13/2016   Right anterior shoulder pain 07/18/2014   Postconcussion syndrome 05/28/2014    Palliative Care Assessment & Plan   Patient Profile:    Assessment:  Patient is a 58 year old female with a past medical history of 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  Of note patient had recent admission for 4/7-10 for management of encephalopathy related to cerebral edema from metastatic disease.  Patient initially refusing admission upon presentation to ER.  Palliative medicine team consulted to assist with complex medical decision making. Of note patient has been seen by palliative medicine team during prior admissions.  Patient had also been referred to outpatient palliative medicine follow-up at [facility] though is noted to have difficulties getting to appointments as she assist with care of her autistic brother who lives with her.  Recommendations/Plan: Continue current pain and non pain medication regimen and monitor hospital course. Re discussed with patient and bedside RN about her opioid regimen.  Radiation oncology note reviewed, appreciate their input and assessment and recommendations, for radiation treatments to be attempted through 12-26-23.  Goals of Care and Additional Recommendations: Limitations on Scope of Treatment: Full Scope Treatment  Code Status:    Code Status Orders  (From admission, onward)           Start     Ordered   12/19/23 1045  Full code  Continuous       Question:  By:  Answer:  Consent: discussion documented in EHR   12/19/23 1045           Code  Status History     Date Active Date Inactive Code Status Order ID Comments User Context  12/13/2023 0737 12/15/2023 1719 Full Code [id number]  Lena Qualia, MD ED   12/13/2023 0019 12/13/2023 0737 Full Code [id number]  Roxana Copier, DO ED   11/22/2023 1953 11/25/2023 1920 Full Code [id number]  Selene Dais, MD ED   10/01/2023 2235 10/14/2023 0046 Full Code [id number]  Corrinne Din, MD ED   09/17/2023 1938 09/20/2023 2053 Full Code [id number]  Walton Guppy, MD ED   01/30/2020 1157 01/30/2020 2002 Full Code [id number]  Tama Fails, PA-C ED   04/16/2018 0009 04/16/2018 1750 Full Code [id number]  Fidencio Hue, MD ED      Advance Directive Documentation    Flowsheet Row Most Recent Value  Type of Advance Directive Healthcare Power of Attorney, Living will  Pre-existing out of facility DNR order (yellow form or pink MOST form) --  "MOST" Form in Place? --       Prognosis:  Unable to determine  Discharge Planning: To Be Determined  Care plan was discussed with  IDT  Thank you for allowing the Palliative Medicine Team to assist in the care of this patient. Mod MDM     Greater than 50%  of this time was spent counseling and coordinating care related to the above assessment and plan.  Lujean Sake, MD  Please contact Palliative Medicine Team phone at [phone] for questions and concerns.

## 2023-12-23 NOTE — Plan of Care (Signed)

## 2023-12-23 NOTE — Progress Notes (Signed)
 Mobility Specialist - Progress Note   12/23/23 1128  Mobility  Activity Ambulated independently in hallway  Level of Assistance Independent  Assistive Device None  Distance Ambulated (ft) 500 ft  Activity Response Tolerated well  Mobility Referral Yes  Mobility visit 1 Mobility  Mobility Specialist Start Time (ACUTE ONLY) 1119  Mobility Specialist Stop Time (ACUTE ONLY) 1125  Mobility Specialist Time Calculation (min) (ACUTE ONLY) 6 min   Pt received in bed and agreeable to mobility. No complaints during session. Pt to bed after session with all needs met.    [facility]

## 2023-12-23 NOTE — Progress Notes (Signed)
 Regina [name] was scheduled for her first radiation treatment to the sacral mass today. Patient refused treatment when our transport team arrived today. Later, I personally met with the her to review our recommendations for palliative radiation. We had a detailed discussion about how this treatment is usually very well tolerated and expected to provide pain relief. She understands that if she proceeds with no treatment her pain will likely worsen.   After a lengthy discussion, Regina [name] stated she will think about her decision. At this time however, she is declining treatment because she "doesn't want to go through it" and would rather manage the pain on her own. She was given our number to contact with any further questions or concerns and will let us  know if she would like to move forward with radiation at any time.      Julio Ohm, PA-C

## 2023-12-23 NOTE — Progress Notes (Signed)
 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   Patient was admitted for management of intractable pain requiring IV medications.  Palliative care is consulted.  Radiation oncology was also notified and patient has been started on radiation.  Assessment & Plan:   Stage IV NSCLC with cancer related pain  Left hip pain, suspect from sacral met (CT occult, PET +)  R shoulder pain, known met with hx pathologic Fx, fixation, radiation.  --Palliative Care is following for symptom management and goals of care discusssions.  Continues to have severe pain. --Radiation Oncology following: Started radiation to sacral mets from 12/21/2023 - Continue Decadron     History of cerebral edema from brain mets:  -Continue dexamethasone  taper   Seizure disorder:  --Continue Keppra    COPD: Appears not on inhalers outpatient --Duonebs PRN   Hypertension:  -- Blood pressure intermittently on the higher side.  Continue metoprolol .  Might have to increase the dose of metoprolol .   GERD:  --Continue PPI   Psychosocial -- pt's brother with Autism lives with her.  Per notes, she is working to attempt to get him to a facility.  Often caring for her brother causes difficulty for pt getting to her own appointments. --TOC consulted   DVT prophylaxis: SCDs Code Status: Full Family Communication: None at bedside Disposition Plan: Status is: Inpatient Remains inpatient appropriate  because: Of severity of illness    Consultants: Palliative care/radiation oncology  Procedures: None  Antimicrobials: None   Subjective: Patient seen and examined at bedside.  Poor historian.  Wants to go home today but complains of 11 out of 10 pain.  No fever, chest pain or shortness of breath reported.  Objective: Vitals:   12/22/23 0959 12/22/23 1332 12/22/23 2129 12/23/23 0530  BP: (!) 162/96 (!) 157/89 (!) 161/97 (!) 158/89  Pulse: 89 68 65 70  Resp:  16 14 14   Temp:  97.8 F (36.6 C) 98.4 F (36.9 C) 98.4 F (36.9 C)  TempSrc:  Oral Oral Oral  SpO2:  99% 100% 100%  Weight:      Height:        Intake/Output Summary (Last 24 hours) at 12/23/2023 0953 Last data filed at 12/22/2023 1500 Gross per 24 hour  Intake 120 ml  Output --  Net 120 ml   Filed Weights   12/18/23 1724  Weight: 53.9 kg    Examination:  General: No acute distress.  Currently on room air.  Chronically ill and deconditioned looking. ENT/neck: No JVD elevation or palpable neck masses noted respiratory: Bilateral decreased breath sounds at bases with scattered crackles CVS: Rate mostly controlled; S1 and S2 are heard  abdominal: Soft, nontender, distended mildly; no organomegaly, bowel sounds are heard normally Extremities: No clubbing; mild lower extremity edema present CNS: Awake, still slow to respond.  No focal neurologic deficit.  Able to move extremities Lymph: No palpable lymphadenopathy Skin: No obvious petechiae/rashes psych: No signs of agitation.  Affect is mostly flat.  Does not participate  in conversation much.   Musculoskeletal: Right shoulder in sling  Data Reviewed: I have personally reviewed following labs and imaging studies  CBC: Recent Labs  Lab 12/18/23 1745 12/20/23 0550 12/21/23 0538 12/23/23 0551  WBC 15.0* 13.3* 16.5* 16.1*  NEUTROABS  --   --   --  13.0*  HGB 14.7 13.3 13.7 13.8  HCT 43.8 41.1 42.9 43.2  MCV 84.2 87.4 88.5 89.6  PLT 524* 480* 453* 567*    Basic Metabolic Panel: Recent Labs  Lab 12/18/23 1745 12/20/23 0550 12/21/23 0538 12/23/23 0551  NA 132* 131* 131* 132*  K 3.0* 4.3 4.5 4.1  CL 97* 99 99 98  CO2 21* 25 22 24   GLUCOSE 100* 117* 98 97  BUN 8 13 14 19   CREATININE 0.73 0.85 0.77 0.76  CALCIUM  9.4 9.3 9.4 9.3  MG  --   --  2.1 1.9  PHOS  --   --  3.4  --    GFR: Estimated Creatinine Clearance: 63.6 mL/min (by C-G formula based on SCr of 0.76 mg/dL). Liver Function Tests: Recent Labs  Lab 12/18/23 1745  AST 26  ALT 17  ALKPHOS 59  BILITOT 0.4  PROT 7.2  ALBUMIN 3.8   No results for input(s): "LIPASE", "AMYLASE" in the last 168 hours. No results for input(s): "AMMONIA" in the last 168 hours. Coagulation Profile: No results for input(s): "INR", "PROTIME" in the last 168 hours. Cardiac Enzymes: No results for input(s): "CKTOTAL", "CKMB", "CKMBINDEX", "TROPONINI" in the last 168 hours. BNP (last 3 results) No results for input(s): "PROBNP" in the last 8760 hours. HbA1C: No results for input(s): "HGBA1C" in the last 72 hours. CBG: No results for input(s): "GLUCAP" in the last 168 hours. Lipid Profile: No results for input(s): "CHOL", "HDL", "LDLCALC", "TRIG", "CHOLHDL", "LDLDIRECT" in the last 72 hours. Thyroid  Function Tests: No results for input(s): "TSH", "T4TOTAL", "FREET4", "T3FREE", "THYROIDAB" in the last 72 hours. Anemia Panel: No results for input(s): "VITAMINB12", "FOLATE", "FERRITIN", "TIBC", "IRON", "RETICCTPCT" in the last 72 hours. Sepsis Labs: No results for input(s): "PROCALCITON", "LATICACIDVEN" in the last 168 hours.  No results found for this or any previous visit (from the past 240 hours).        Radiology Studies: No results found.      Scheduled Meds:  acetaminophen   1,000 mg Oral Q6H   amLODipine   5 mg Oral Daily   cyclobenzaprine   10 mg Oral QHS   dexamethasone   2 mg Oral Q12H   diclofenac  Sodium  2 g Topical QID   levETIRAcetam   500 mg Oral BID   metoprolol   tartrate  25 mg Oral BID   pantoprazole   40 mg Oral Daily   polyethylene glycol  17 g Oral Daily   senna  1 tablet Oral BID   Continuous Infusions:        Audria Leather, MD [facility] 12/23/2023, 9:53 AM

## 2023-12-23 NOTE — Telephone Encounter (Signed)
 4/18 Patient called from inpatient stated after talking to [place], [place] she would like to proceed with Radiation Treatments.  I called Support RTT and forward call as requested.

## 2023-12-24 DIAGNOSIS — G893 Neoplasm related pain (acute) (chronic): Secondary | ICD-10-CM | POA: Diagnosis not present

## 2023-12-24 MED ORDER — OXYCODONE HCL ER 15 MG PO T12A
15.0000 mg | EXTENDED_RELEASE_TABLET | Freq: Two times a day (BID) | ORAL | Status: DC
  Administered 2023-12-24 – 2023-12-26 (×5): 15 mg via ORAL
  Filled 2023-12-24 (×5): qty 1

## 2023-12-24 NOTE — Progress Notes (Signed)
 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   Patient was admitted for management of intractable pain requiring IV medications.  Palliative care is consulted.  Radiation oncology following.  Assessment & Plan:   Stage IV NSCLC with cancer related pain  Left hip pain, suspect from sacral met (CT occult, PET +)  R shoulder pain, known met with hx pathologic Fx, fixation, radiation.  --Palliative Care is following for symptom management and goals of care discusssions.  Continues to have severe pain. --Radiation Oncology following: Radiation was supposed to start on 12/23/2023 but patient refused it initially but subsequently has agreed to start radiation. - Continue Decadron     History of cerebral edema from brain mets:  -Continue dexamethasone  taper   Seizure disorder:  --Continue Keppra    COPD: Appears not on inhalers outpatient --Duonebs PRN   Hypertension:  -- Blood pressure intermittently on the higher side.  Continue metoprolol .    GERD:  --Continue PPI   Psychosocial -- pt's brother with Autism lives with her.  Per notes, she is working to attempt to get him to a facility.  Often caring for her brother causes difficulty for pt getting to her own appointments. --TOC consulted   DVT prophylaxis: SCDs Code Status: Full Family Communication: None at bedside Disposition Plan: Status is: Inpatient Remains inpatient appropriate because: Of severity of  illness    Consultants: Palliative care/radiation oncology  Procedures: None  Antimicrobials: None   Subjective: Patient seen and examined at bedside.  Poor historian.  Continues to have severe intermittent pain.  No seizures, vomiting or agitation reported. Objective: Vitals:   12/23/23 0530 12/23/23 1026 12/23/23 1336 12/24/23 0513  BP: (!) 158/89 (!) 158/88 (!) 165/95 132/86  Pulse: 70 96 79 74  Resp: 14 16 20 14   Temp: 98.4 F (36.9 C) 98.5 F (36.9 C) 98.4 F (36.9 C) 97.7 F (36.5 C)  TempSrc: Oral Oral Oral Oral  SpO2: 100% 100% 100% 99%  Weight:      Height:       No intake or output data in the 24 hours ending 12/24/23 0838  Filed Weights   12/18/23 1724  Weight: 53.9 kg    Examination:  General: On room air.  No distress.  Looks chronically ill and deconditioned.   Respiratory: Decreased breath sounds at bases bilaterally with some crackles CVS: Currently rate controlled; S1-S2 heard  abdominal: Soft, nontender, slightly distended, no organomegaly; normal bowel sounds are heard  extremities: Trace lower extremity edema; no cyanosis.  Right shoulder in a sling.   Data Reviewed: I have personally reviewed following labs and imaging studies  CBC: Recent Labs  Lab 12/18/23 1745 12/20/23 0550 12/21/23 0538 12/23/23 0551  WBC 15.0* 13.3* 16.5* 16.1*  NEUTROABS  --   --   --  13.0*  HGB 14.7 13.3 13.7 13.8  HCT 43.8 41.1 42.9 43.2  MCV 84.2 87.4 88.5 89.6  PLT 524* 480* 453* 567*   Basic Metabolic Panel: Recent Labs  Lab 12/18/23 1745 12/20/23 0550 12/21/23 0538 12/23/23 0551  NA 132* 131* 131* 132*  K 3.0* 4.3 4.5 4.1  CL 97* 99 99 98  CO2 21* 25 22 24   GLUCOSE 100* 117* 98 97  BUN 8 13 14 19   CREATININE 0.73 0.85 0.77 0.76  CALCIUM  9.4 9.3 9.4 9.3  MG  --   --  2.1 1.9  PHOS  --   --  3.4  --    GFR: Estimated Creatinine Clearance: 63.6 mL/min (by C-G formula based on SCr of 0.76 mg/dL). Liver Function Tests: Recent Labs  Lab  12/18/23 1745  AST 26  ALT 17  ALKPHOS 59  BILITOT 0.4  PROT 7.2  ALBUMIN 3.8   No results for input(s): "LIPASE", "AMYLASE" in the last 168 hours. No results for input(s): "AMMONIA" in the last 168 hours. Coagulation Profile: No results for input(s): "INR", "PROTIME" in the last 168 hours. Cardiac Enzymes: No results for input(s): "CKTOTAL", "CKMB", "CKMBINDEX", "TROPONINI" in the last 168 hours. BNP (last 3 results) No results for input(s): "PROBNP" in the last 8760 hours. HbA1C: No results for input(s): "HGBA1C" in the last 72 hours. CBG: No results for input(s): "GLUCAP" in the last 168 hours. Lipid Profile: No results for input(s): "CHOL", "HDL", "LDLCALC", "TRIG", "CHOLHDL", "LDLDIRECT" in the last 72 hours. Thyroid  Function Tests: No results for input(s): "TSH", "T4TOTAL", "FREET4", "T3FREE", "THYROIDAB" in the last 72 hours. Anemia Panel: No results for input(s): "VITAMINB12", "FOLATE", "FERRITIN", "TIBC", "IRON", "RETICCTPCT" in the last 72 hours. Sepsis Labs: No results for input(s): "PROCALCITON", "LATICACIDVEN" in the last 168 hours.  No results found for this or any previous visit (from the past 240 hours).        Radiology Studies: No results found.      Scheduled Meds:  acetaminophen   1,000 mg Oral Q6H   amLODipine   5 mg Oral Daily   cyclobenzaprine   10 mg Oral QHS   dexamethasone   2 mg Oral Q12H   diclofenac  Sodium  2 g Topical QID   levETIRAcetam   500 mg Oral BID   metoprolol  tartrate  25 mg Oral BID   pantoprazole   40 mg Oral Daily   polyethylene glycol  17 g Oral Daily   senna  1 tablet Oral BID   Continuous Infusions:        Audria Leather, MD [facility] 12/24/2023, 8:38 AM

## 2023-12-24 NOTE — Plan of Care (Signed)

## 2023-12-24 NOTE — Progress Notes (Signed)
 Daily Progress Note   Patient Name: Regina [name]       Date: 12/24/2023 DOB: [birth year]  Age: 58 y.o. MRN#: [id number] Attending Physician: Audria Leather, MD Primary Care Physician: Marius Siemens, NP Admit Date: 12/18/2023  Reason for Consultation/Follow-up: Non pain symptom management and Pain control  Subjective:  Patient is awake alert, sitting by the edge of her bed.  Continues to complain of foot pain.  She did go for radiation on 12-23-2023.  Medication history reviewed and again discussed with the patient.  Offered active listening and supportive presence and rediscussed opioid regimen and offered assurance that we are keeping track of her pain medicine and want to keep her as comfortable as possible while she tolerates radiation treatments. Length of Stay: 5  Current Medications: Scheduled Meds:   acetaminophen   1,000 mg Oral Q6H   amLODipine   5 mg Oral Daily   cyclobenzaprine   10 mg Oral QHS   dexamethasone   2 mg Oral Q12H   diclofenac  Sodium  2 g Topical QID   levETIRAcetam   500 mg Oral BID   metoprolol  tartrate  25 mg Oral BID   oxyCODONE   15 mg Oral Q12H   pantoprazole   40 mg Oral Daily   polyethylene glycol  17 g Oral Daily   senna  1 tablet Oral BID    Continuous Infusions:   PRN Meds: acetaminophen  **OR** acetaminophen , HYDROmorphone  (DILAUDID ) injection, ipratropium-albuterol , ondansetron  **OR** ondansetron  (ZOFRAN ) IV, [DISCONTINUED] oxyCODONE  **OR** oxyCODONE , traZODone   Physical Exam         Awake alert Mild distress due to pain Resting in bed  Vital Signs: BP 139/87 (BP Location: Left Arm)   Pulse 75   Temp 97.7 F (36.5 C) (Oral)   Resp 14   Ht 5\' 6"  (1.676 m)   Wt 53.9 kg   LMP 06/06/2010   SpO2 99%   BMI 19.18 kg/m  SpO2: SpO2: 99 % O2  Device: O2 Device: Room Air O2 Flow Rate:    Intake/output summary:  Intake/Output Summary (Last 24 hours) at 12/24/2023 1114 Last data filed at 12/24/2023 0900 Gross per 24 hour  Intake 240 ml  Output --  Net 240 ml   LBM: Last BM Date : 12/20/23 Baseline Weight: Weight: 53.9 kg Most recent weight: Weight: 53.9 kg  Palliative Assessment/Data:      Patient Active Problem List   Diagnosis Date Noted   Cancer-related breakthrough pain 12/19/2023   High risk medication use 12/19/2023   Non-small cell lung cancer (HCC) 12/19/2023   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   Medication management 12/19/2023   Cancer associated pain 12/19/2023   Acute metabolic encephalopathy 12/13/2023   Expressive aphasia 12/13/2023   SIRS (systemic inflammatory response syndrome) (HCC) 12/13/2023   Lactic acidosis 12/13/2023   History of COVID-19 12/13/2023   Metabolic acidosis with increased anion gap and accumulation of organic acids 12/13/2023   Pathologic fracture 11/22/2023   Hypokalemia 11/22/2023   Closed fracture of shaft of right humerus 10/10/2023   Pressure injury of skin 10/10/2023   Cerebral intracranial abscess 10/07/2023   Palliative care encounter 10/04/2023   Pathological fracture of right humerus with delayed healing 10/04/2023   Lung cancer metastatic to brain (HCC) 10/04/2023   Vasogenic brain edema (HCC) 10/04/2023   Counseling and coordination of care 10/04/2023   Protein-calorie malnutrition, severe 10/04/2023   Metastasis to brain (HCC) 10/03/2023   Metastasis to bone (HCC) 10/03/2023   Cerebral edema (HCC) 10/01/2023   Primary adenocarcinoma of upper lobe of right lung (HCC) 09/22/2023   Pathological fracture in neoplastic disease, right humerus, initial encounter for fracture 09/19/2023   Neoplasm causing mass effect and brain compression on adjacent structures (HCC) 09/18/2023   COPD (chronic obstructive pulmonary disease) (HCC)    Anxiety     Hyponatremia 04/16/2018   Depression 04/16/2018   Hypertensive urgency    HTN (hypertension) 01/13/2016   Right anterior shoulder pain 07/18/2014   Postconcussion syndrome 05/28/2014    Palliative Care Assessment & Plan   Patient Profile:    Assessment:  Patient is a 58 year old female with a past medical history of 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  Of note patient had recent admission for 4/7-10 for management of encephalopathy related to cerebral edema from metastatic disease.  Patient initially refusing admission upon presentation to ER.  Palliative medicine team consulted to assist with complex medical decision making. Of note patient has been seen by palliative medicine team during prior admissions.  Patient had also been referred to outpatient palliative medicine follow-up at [facility] though is noted to have difficulties getting to appointments as she assist with care of her autistic brother who lives with her.  Recommendations/Plan: Pain medication regimen reviewed and discussed with the patient.  Start low-dose OxyContin  15 mg twice daily.  Continue oxycodone  immediate release p.o. as needed she also has IV Dilaudid  as needed as well as scheduled p.o. Tylenol . Radiation oncology note reviewed, appreciate their input and assessment and recommendations, for radiation treatments to be attempted through 12-26-23.  Goals of Care and Additional Recommendations: Limitations on Scope of Treatment: Full Scope Treatment  Code Status:    Code Status Orders  (From admission, onward)           Start     Ordered   12/19/23 1045  Full code  Continuous       Question:  By:  Answer:  Consent:  discussion documented in EHR   12/19/23 1045           Code Status History  Date Active Date Inactive Code Status Order ID Comments User Context   12/13/2023 0737 12/15/2023 1719 Full Code [id number]  Lena Qualia, MD ED   12/13/2023 0019 12/13/2023 0737 Full Code [id number]  Roxana Copier, DO ED   11/22/2023 1953 11/25/2023 1920 Full Code [id number]  Selene Dais, MD ED   10/01/2023 2235 10/14/2023 0046 Full Code [id number]  Corrinne Din, MD ED   09/17/2023 1938 09/20/2023 2053 Full Code [id number]  Walton Guppy, MD ED   01/30/2020 1157 01/30/2020 2002 Full Code [id number]  Tama Fails, PA-C ED   04/16/2018 0009 04/16/2018 1750 Full Code [id number]  Fidencio Hue, MD ED      Advance Directive Documentation    Flowsheet Row Most Recent Value  Type of Advance Directive Healthcare Power of Attorney, Living will  Pre-existing out of facility DNR order (yellow form or pink MOST form) --  "MOST" Form in Place? --       Prognosis:  Unable to determine  Discharge Planning: To Be Determined  Care plan was discussed with  IDT  Thank you for allowing the Palliative Medicine Team to assist in the care of this patient. Mod MDM     Greater than 50%  of this time was spent counseling and coordinating care related to the above assessment and plan.  Lujean Sake, MD  Please contact Palliative Medicine Team phone at [phone] for questions and concerns.

## 2023-12-24 NOTE — Progress Notes (Signed)
 Mobility Specialist - Progress Note   12/24/23 1010  Mobility  Activity Ambulated independently in hallway  Level of Assistance Independent  Assistive Device None  Distance Ambulated (ft) 460 ft  Activity Response Tolerated well  Mobility Referral Yes  Mobility visit 1 Mobility  Mobility Specialist Start Time (ACUTE ONLY) 1005  Mobility Specialist Stop Time (ACUTE ONLY) 1010  Mobility Specialist Time Calculation (min) (ACUTE ONLY) 5 min   Pt received in bed and agreeable to mobility. No complaints during session. Pt to bed after session with all needs met.    [facility]

## 2023-12-25 DIAGNOSIS — G893 Neoplasm related pain (acute) (chronic): Secondary | ICD-10-CM | POA: Diagnosis not present

## 2023-12-25 NOTE — Progress Notes (Signed)
 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   Patient was admitted for management of intractable pain requiring IV medications.  Palliative care is consulted.  Radiation oncology following.  Assessment & Plan:   Stage IV NSCLC with cancer related pain  Left hip pain, suspect from sacral met (CT occult, PET +)  R shoulder pain, known met with hx pathologic Fx, fixation, radiation.  --Palliative Care is following for symptom management and goals of care discusssions.  Continues to have severe pain.  Pain management as per palliative care --Radiation Oncology following: Radiation was supposed to start on 12/23/2023 but patient refused it initially but subsequently agreed to start radiation. - Continue Decadron     History of cerebral edema from brain mets:  -Continue dexamethasone  taper   Seizure disorder:  --Continue Keppra    COPD: Appears not on inhalers outpatient --Duonebs PRN   Hypertension:  -- Blood pressure intermittently on the higher side.  Continue metoprolol .    GERD:  --Continue PPI   Psychosocial -- pt's brother with Autism lives with her.  Per notes, she is working to attempt to get him to a facility.  Often caring for her brother causes difficulty for pt getting to her own appointments. --TOC consulted   DVT prophylaxis: SCDs Code Status: Full Family Communication: None at bedside Disposition Plan: Status is: Inpatient Remains inpatient  appropriate because: Of severity of illness    Consultants: Palliative care/radiation oncology  Procedures: None  Antimicrobials: None   Subjective: Patient seen and examined at bedside.  Poor historian.  Does not participate in conversation much.  Continues to have severe pain.  No seizures or vomiting reported. Objective: Vitals:   12/24/23 1356 12/24/23 2102 12/24/23 2157 12/25/23 0514  BP: (!) 146/89 (!) 162/105 (!) 141/86 (!) 145/92  Pulse: 70 87 68 83  Resp: 17 18  14   Temp: 98.3 F (36.8 C) 98.2 F (36.8 C)  98.2 F (36.8 C)  TempSrc: Oral Oral  Oral  SpO2: 100% 98%  99%  Weight:      Height:        Intake/Output Summary (Last 24 hours) at 12/25/2023 0832 Last data filed at 12/24/2023 0900 Gross per 24 hour  Intake 240 ml  Output --  Net 240 ml    Filed Weights   12/18/23 1724  Weight: 53.9 kg    Examination:  General: No acute distress.  Remains on room air.  Poor historian.  Flat affect.  Does not participate in conversation much.  Looks chronically ill and deconditioned.   Respiratory: Bilateral decreased breath sounds at bases with scattered crackles  CVS: S1-S2 heard; rate currently controlled abdominal: Soft, nontender, distended mildly, no organomegaly; bowel sounds normally heard  extremities: No clubbing; mild lower extremity edema present.  Right shoulder in a sling.   Data Reviewed: I have personally reviewed following labs and imaging studies  CBC: Recent Labs  Lab 12/18/23 1745 12/20/23 0550 12/21/23 0538 12/23/23 0551  WBC 15.0* 13.3* 16.5* 16.1*  NEUTROABS  --   --   --  13.0*  HGB 14.7 13.3 13.7 13.8  HCT 43.8 41.1 42.9 43.2  MCV 84.2 87.4 88.5 89.6  PLT 524* 480* 453* 567*   Basic Metabolic Panel: Recent Labs  Lab 12/18/23 1745 12/20/23 0550 12/21/23 0538 12/23/23 0551  NA 132* 131* 131* 132*  K 3.0* 4.3 4.5 4.1  CL 97* 99 99 98  CO2 21* 25 22 24   GLUCOSE 100* 117* 98 97  BUN 8 13 14 19   CREATININE 0.73 0.85 0.77  0.76  CALCIUM  9.4 9.3 9.4 9.3  MG  --   --  2.1 1.9  PHOS  --   --  3.4  --    GFR: Estimated Creatinine Clearance: 63.6 mL/min (by C-G formula based on SCr of 0.76 mg/dL). Liver Function Tests: Recent Labs  Lab 12/18/23 1745  AST 26  ALT 17  ALKPHOS 59  BILITOT 0.4  PROT 7.2  ALBUMIN 3.8   No results for input(s): "LIPASE", "AMYLASE" in the last 168 hours. No results for input(s): "AMMONIA" in the last 168 hours. Coagulation Profile: No results for input(s): "INR", "PROTIME" in the last 168 hours. Cardiac Enzymes: No results for input(s): "CKTOTAL", "CKMB", "CKMBINDEX", "TROPONINI" in the last 168 hours. BNP (last 3 results) No results for input(s): "PROBNP" in the last 8760 hours. HbA1C: No results for input(s): "HGBA1C" in the last 72 hours. CBG: No results for input(s): "GLUCAP" in the last 168 hours. Lipid Profile: No results for input(s): "CHOL", "HDL", "LDLCALC", "TRIG", "CHOLHDL", "LDLDIRECT" in the last 72 hours. Thyroid  Function Tests: No results for input(s): "TSH", "T4TOTAL", "FREET4", "T3FREE", "THYROIDAB" in the last 72 hours. Anemia Panel: No results for input(s): "VITAMINB12", "FOLATE", "FERRITIN", "TIBC", "IRON", "RETICCTPCT" in the last 72 hours. Sepsis Labs: No results for input(s): "PROCALCITON", "LATICACIDVEN" in the last 168 hours.  No results found for this or any previous visit (from the past 240 hours).        Radiology Studies: No results found.      Scheduled Meds:  acetaminophen   1,000 mg Oral Q6H   amLODipine   5 mg Oral Daily   cyclobenzaprine   10 mg Oral QHS   dexamethasone   2 mg Oral Q12H   diclofenac  Sodium  2 g Topical QID   levETIRAcetam   500 mg Oral BID   metoprolol  tartrate  25 mg Oral BID   oxyCODONE   15 mg Oral Q12H   pantoprazole   40 mg Oral Daily   polyethylene glycol  17 g Oral Daily   senna  1 tablet Oral BID   Continuous Infusions:        Audria Leather, MD [facility] 12/25/2023, 8:32 AM

## 2023-12-25 NOTE — Progress Notes (Signed)
 Daily Progress Note   Patient Name: Regina [name]       Date: 12/25/2023 DOB: 11-07-1965  Age: 58 y.o. MRN#: [id number] Attending Physician: Audria Leather, MD Primary Care Physician: Marius Siemens, NP Admit Date: 12/18/2023  Reason for Consultation/Follow-up: Non pain symptom management and Pain control  Subjective:  Patient is awake alert, sitting by the edge of her bed.  Medication history noted, patient participating with mobility specialist.  Length of Stay: 6  Current Medications: Scheduled Meds:   acetaminophen   1,000 mg Oral Q6H   amLODipine   5 mg Oral Daily   cyclobenzaprine   10 mg Oral QHS   dexamethasone   2 mg Oral Q12H   diclofenac  Sodium  2 g Topical QID   levETIRAcetam   500 mg Oral BID   metoprolol  tartrate  25 mg Oral BID   oxyCODONE   15 mg Oral Q12H   pantoprazole   40 mg Oral Daily   polyethylene glycol  17 g Oral Daily   senna  1 tablet Oral BID    Continuous Infusions:   PRN Meds: acetaminophen  **OR** acetaminophen , HYDROmorphone  (DILAUDID ) injection, ipratropium-albuterol , ondansetron  **OR** ondansetron  (ZOFRAN ) IV, [DISCONTINUED] oxyCODONE  **OR** oxyCODONE , traZODone   Physical Exam         Awake alert Mild distress due to pain Resting in bed  Vital Signs: BP (!) 159/62 (BP Location: Left Arm)   Pulse 88   Temp 98.2 F (36.8 C) (Oral)   Resp 14   Ht 5\' 6"  (1.676 m)   Wt 53.9 kg   LMP 06/06/2010   SpO2 99%   BMI 19.18 kg/m  SpO2: SpO2: 99 % O2 Device: O2 Device: Room Air O2 Flow Rate:    Intake/output summary:  Intake/Output Summary (Last 24 hours) at 12/25/2023 1128 Last data filed at 12/25/2023 1051 Gross per 24 hour  Intake 240 ml  Output --  Net 240 ml   LBM: Last BM Date : 12/20/23 Baseline Weight: Weight: 53.9 kg Most recent  weight: Weight: 53.9 kg       Palliative Assessment/Data:      Patient Active Problem List   Diagnosis Date Noted   Cancer-related breakthrough pain 12/19/2023   High risk medication use 12/19/2023   Non-small cell lung cancer (HCC) 12/19/2023   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   Medication management 12/19/2023  Cancer associated pain 12/19/2023   Acute metabolic encephalopathy 12/13/2023   Expressive aphasia 12/13/2023   SIRS (systemic inflammatory response syndrome) (HCC) 12/13/2023   Lactic acidosis 12/13/2023   History of COVID-19 12/13/2023   Metabolic acidosis with increased anion gap and accumulation of organic acids 12/13/2023   Pathologic fracture 11/22/2023   Hypokalemia 11/22/2023   Closed fracture of shaft of right humerus 10/10/2023   Pressure injury of skin 10/10/2023   Cerebral intracranial abscess 10/07/2023   Palliative care encounter 10/04/2023   Pathological fracture of right humerus with delayed healing 10/04/2023   Lung cancer metastatic to brain (HCC) 10/04/2023   Vasogenic brain edema (HCC) 10/04/2023   Counseling and coordination of care 10/04/2023   Protein-calorie malnutrition, severe 10/04/2023   Metastasis to brain [facility]) 10/03/2023   Metastasis to bone (HCC) 10/03/2023   Cerebral edema (HCC) 10/01/2023   Primary adenocarcinoma of upper lobe of right lung (HCC) 09/22/2023   Pathological fracture in neoplastic disease, right humerus, initial encounter for fracture 09/19/2023   Neoplasm causing mass effect and brain compression on adjacent structures (HCC) 09/18/2023   COPD (chronic obstructive pulmonary disease) (HCC)    Anxiety    Hyponatremia 04/16/2018   Depression 04/16/2018   Hypertensive urgency    HTN (hypertension) 01/13/2016   Right anterior shoulder pain 07/18/2014   Postconcussion syndrome 05/28/2014    Palliative Care Assessment & Plan   Patient Profile:    Assessment:  Patient is a 58 year old female with a past  medical history of 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  Of note patient had recent admission for 4/7-10 for management of encephalopathy related to cerebral edema from metastatic disease.  Patient initially refusing admission upon presentation to ER.  Palliative medicine team consulted to assist with complex medical decision making. Of note patient has been seen by palliative medicine team during prior admissions.  Patient had also been referred to outpatient palliative medicine follow-up at [facility] though is noted to have difficulties getting to appointments as she assist with care of her autistic brother who lives with her.  Recommendations/Plan: Pain medication regimen reviewed and discussed with the patient.  Continue OxyContin  15 mg twice daily.  Continue oxycodone  immediate release p.o. as needed she also has IV Dilaudid  as needed as well as scheduled p.o. Tylenol . Radiation oncology note reviewed, appreciate their input and assessment and recommendations, for radiation treatments to be attempted through 12-26-23. Agree with TOC consult Goals of Care and Additional Recommendations: Limitations on Scope of Treatment: Full Scope Treatment  Code Status:    Code Status Orders  (From admission, onward)           Start     Ordered   12/19/23 1045  Full code  Continuous       Question:  By:  Answer:  Consent: discussion documented in EHR   12/19/23 1045           Code Status History     Date Active Date Inactive Code Status Order ID Comments User Context   12/13/2023 0737 12/15/2023 1719 Full Code [id number]  Lena Qualia, MD ED   12/13/2023 0019 12/13/2023 0737 Full Code [id number]  Roxana Copier, DO ED   11/22/2023 1953 11/25/2023 1920 Full  Code [id number]  Doutova, Anastassia, MD ED   10/01/2023 2235 10/14/2023 0046 Full Code [id number]  Corrinne Din, MD ED   09/17/2023 1938 09/20/2023 2053 Full Code [id number]  Walton Guppy, MD ED   01/30/2020 1157 01/30/2020 2002 Full Code [id number]  Tama Fails, PA-C ED   04/16/2018 0009 04/16/2018 1750 Full Code [id number]  Fidencio Hue, MD ED      Advance Directive Documentation    Flowsheet Row Most Recent Value  Type of Advance Directive Healthcare Power of Attorney, Living will  Pre-existing out of facility DNR order (yellow form or pink MOST form) --  "MOST" Form in Place? --       Prognosis:  Unable to determine  Discharge Planning: To Be Determined  Care plan was discussed with  IDT  Thank you for allowing the Palliative Medicine Team to assist in the care of this patient. Mod MDM     Greater than 50%  of this time was spent counseling and coordinating care related to the above assessment and plan.  Lujean Sake, MD  Please contact Palliative Medicine Team phone at [phone] for questions and concerns.

## 2023-12-25 NOTE — Plan of Care (Signed)
   Problem: Education: Goal: Knowledge of risk factors and measures for prevention of condition will improve Outcome: Progressing   Problem: Coping: Goal: Psychosocial and spiritual needs will be supported Outcome: Progressing   Problem: Respiratory: Goal: Will maintain a patent airway Outcome: Progressing Goal: Complications related to the disease process, condition or treatment will be avoided or minimized Outcome: Progressing

## 2023-12-25 NOTE — Progress Notes (Signed)
 Mobility Specialist - Progress Note   12/25/23 1051  Mobility  Activity Ambulated independently in hallway  Level of Assistance Independent  Assistive Device None  Distance Ambulated (ft) 450 ft  Range of Motion/Exercises Active  Activity Response Tolerated well  Mobility Referral Yes  Mobility visit 1 Mobility  Mobility Specialist Start Time (ACUTE ONLY) 1041  Mobility Specialist Stop Time (ACUTE ONLY) 1051  Mobility Specialist Time Calculation (min) (ACUTE ONLY) 10 min   Pt was found sitting EOB and agreeable to ambulate. No complaints and returned to sit EOB with all needs met. Call bell in reach.  Lorna Rose Mobility Specialist

## 2023-12-25 NOTE — Plan of Care (Signed)
  Problem: Education: Goal: Knowledge of risk factors and measures for prevention of condition will improve Outcome: Progressing   Problem: Coping: Goal: Psychosocial and spiritual needs will be supported Outcome: Progressing   Problem: Respiratory: Goal: Will maintain a patent airway Outcome: Progressing Goal: Complications related to the disease process, condition or treatment will be avoided or minimized Outcome: Progressing   Problem: Education: Goal: Knowledge of General Education information will improve Description: Including pain rating scale, medication(s)/side effects and non-pharmacologic comfort measures Outcome: Progressing   Problem: Health Behavior/Discharge Planning: Goal: Ability to manage health-related needs will improve Outcome: Progressing   Problem: Clinical Measurements: Goal: Ability to maintain clinical measurements within normal limits will improve Outcome: Progressing Goal: Will remain free from infection Outcome: Progressing Goal: Diagnostic test results will improve Outcome: Progressing Goal: Respiratory complications will improve Outcome: Progressing Goal: Cardiovascular complication will be avoided Outcome: Progressing   

## 2023-12-26 ENCOUNTER — Ambulatory Visit
Admit: 2023-12-26 | Discharge: 2023-12-26 | Discharge status: Home / Self Care | Payer: MEDICAID | Attending: Radiation Oncology

## 2023-12-26 ENCOUNTER — Other Ambulatory Visit: Payer: Self-pay

## 2023-12-26 ENCOUNTER — Telehealth (INDEPENDENT_AMBULATORY_CARE_PROVIDER_SITE_OTHER): Payer: Self-pay | Admitting: Primary Care

## 2023-12-26 ENCOUNTER — Ambulatory Visit: Admission: RE | Admit: 2023-12-26 | Payer: MEDICAID | Source: Ambulatory Visit

## 2023-12-26 DIAGNOSIS — C3411 Malignant neoplasm of upper lobe, right bronchus or lung: Secondary | ICD-10-CM | POA: Diagnosis not present

## 2023-12-26 DIAGNOSIS — C7931 Secondary malignant neoplasm of brain: Secondary | ICD-10-CM | POA: Diagnosis not present

## 2023-12-26 DIAGNOSIS — G893 Neoplasm related pain (acute) (chronic): Secondary | ICD-10-CM | POA: Diagnosis not present

## 2023-12-26 DIAGNOSIS — C7951 Secondary malignant neoplasm of bone: Secondary | ICD-10-CM | POA: Diagnosis not present

## 2023-12-26 LAB — RAD ONC ARIA SESSION SUMMARY
Course Elapsed Days: 5
Plan Fractions Treated to Date: 4
Plan Prescribed Dose Per Fraction: 5 Gy
Plan Total Fractions Prescribed: 4
Plan Total Prescribed Dose: 20 Gy
Reference Point Dosage Given to Date: 20 Gy
Reference Point Session Dosage Given: 5 Gy
Session Number: 4

## 2023-12-26 MED ORDER — FLUCONAZOLE 150 MG PO TABS
150.0000 mg | ORAL_TABLET | Freq: Once | ORAL | Status: AC
  Administered 2023-12-26: 150 mg via ORAL
  Filled 2023-12-26: qty 1

## 2023-12-26 MED ORDER — OXYCODONE HCL ER 15 MG PO T12A
15.0000 mg | EXTENDED_RELEASE_TABLET | Freq: Three times a day (TID) | ORAL | Status: DC
  Administered 2023-12-26 – 2023-12-27 (×2): 15 mg via ORAL
  Filled 2023-12-26 (×2): qty 1

## 2023-12-26 NOTE — Progress Notes (Signed)
 Daily Progress Note   Patient Name: Regina [name]       Date: 12/26/2023 DOB: 04/27/1966  Age: 58 y.o. MRN#: [id number] Attending Physician: Audria Leather, MD Primary Care Physician: Marius Siemens, NP Admit Date: 12/18/2023  Reason for Consultation/Follow-up: Non pain symptom management and Pain control  Subjective: Tolerating radiation, ongoing efforts at pain control.   Length of Stay: 7  Current Medications: Scheduled Meds:   acetaminophen   1,000 mg Oral Q6H   amLODipine   5 mg Oral Daily   cyclobenzaprine   10 mg Oral QHS   dexamethasone   2 mg Oral Q12H   diclofenac  Sodium  2 g Topical QID   levETIRAcetam   500 mg Oral BID   metoprolol  tartrate  25 mg Oral BID   oxyCODONE   15 mg Oral Q12H   pantoprazole   40 mg Oral Daily   polyethylene glycol  17 g Oral Daily   senna  1 tablet Oral BID    Continuous Infusions:   PRN Meds: acetaminophen  **OR** acetaminophen , HYDROmorphone  (DILAUDID ) injection, ipratropium-albuterol , ondansetron  **OR** ondansetron  (ZOFRAN ) IV, [DISCONTINUED] oxyCODONE  **OR** oxyCODONE , traZODone   Physical Exam         Awake alert Mild distress due to pain Resting in bed  Vital Signs: BP 122/74 (BP Location: Left Arm)   Pulse 65   Temp 98.4 F (36.9 C) (Oral)   Resp 18   Ht 5\' 6"  (1.676 m)   Wt 53.9 kg   LMP 06/06/2010   SpO2 95%   BMI 19.18 kg/m  SpO2: SpO2: 95 % O2 Device: O2 Device: Room Air O2 Flow Rate:    Intake/output summary:  Intake/Output Summary (Last 24 hours) at 12/26/2023 1144 Last data filed at 12/25/2023 2000 Gross per 24 hour  Intake 480 ml  Output --  Net 480 ml   LBM: Last BM Date : 12/20/23 Baseline Weight: Weight: 53.9 kg Most recent weight: Weight: 53.9 kg       Palliative Assessment/Data:      Patient  Active Problem List   Diagnosis Date Noted   Cancer-related breakthrough pain 12/19/2023   High risk medication use 12/19/2023   Non-small cell lung cancer (HCC) 12/19/2023   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   Medication management 12/19/2023   Cancer associated pain 12/19/2023   Acute metabolic encephalopathy 12/13/2023   Expressive  aphasia 12/13/2023   SIRS (systemic inflammatory response syndrome) (HCC) 12/13/2023   Lactic acidosis 12/13/2023   History of COVID-19 12/13/2023   Metabolic acidosis with increased anion gap and accumulation of organic acids 12/13/2023   Pathologic fracture 11/22/2023   Hypokalemia 11/22/2023   Closed fracture of shaft of right humerus 10/10/2023   Pressure injury of skin 10/10/2023   Cerebral intracranial abscess 10/07/2023   Palliative care encounter 10/04/2023   Pathological fracture of right humerus with delayed healing 10/04/2023   Lung cancer metastatic to brain (HCC) 10/04/2023   Vasogenic brain edema (HCC) 10/04/2023   Counseling and coordination of care 10/04/2023   Protein-calorie malnutrition, severe 10/04/2023   Metastasis to brain [facility]) 10/03/2023   Metastasis to bone (HCC) 10/03/2023   Cerebral edema (HCC) 10/01/2023   Primary adenocarcinoma of upper lobe of right lung (HCC) 09/22/2023   Pathological fracture in neoplastic disease, right humerus, initial encounter for fracture 09/19/2023   Neoplasm causing mass effect and brain compression on adjacent structures (HCC) 09/18/2023   COPD (chronic obstructive pulmonary disease) (HCC)    Anxiety    Hyponatremia 04/16/2018   Depression 04/16/2018   Hypertensive urgency    HTN (hypertension) 01/13/2016   Right anterior shoulder pain 07/18/2014   Postconcussion syndrome 05/28/2014    Palliative Care Assessment & Plan   Patient Profile:    Assessment:  Patient is a 58 year old female with a past medical history of stage IV non-small cell lung cancer with metastatic disease  to brain, bone, and lymph nodes status post stereotactic radiation surgery January 2025, pathological fracture of right humerus status post nailing with radiation to humerus completed 11/2023, COPD, and hypertension who presented to ED on 12/18/2023 for management of severe pain.  Of note patient had recent admission for 4/7-10 for management of encephalopathy related to cerebral edema from metastatic disease.  Patient initially refusing admission upon presentation to ER.  Palliative medicine team consulted to assist with complex medical decision making. Of note patient has been seen by palliative medicine team during prior admissions.  Patient had also been referred to outpatient palliative medicine follow-up at [facility] though is noted to have difficulties getting to appointments as she assist with care of her autistic brother who lives with her.  Recommendations/Plan: Pain medication regimen reviewed and discussed with the patient.  Change OxyContin  to 15 mg Q 8 hours.  Continue oxycodone  immediate release p.o. as needed she also has IV Dilaudid  as needed as well as scheduled p.o. Tylenol . Radiation oncology note reviewed, appreciate their input and assessment and recommendations, for radiation treatments to be attempted through 12-26-23. Agree with TOC consult - recommend SNF rehab with palliative.  Goals of Care and Additional Recommendations: Limitations on Scope of Treatment: Full Scope Treatment  Code Status:    Code Status Orders  (From admission, onward)           Start     Ordered   12/19/23 1045  Full code  Continuous       Question:  By:  Answer:  Consent: discussion documented in EHR   12/19/23 1045           Code Status History     Date Active Date Inactive Code Status Order ID Comments User Context   12/13/2023 0737 12/15/2023 1719 Full Code [id number]  Lena Qualia, MD ED   12/13/2023 0019 12/13/2023 0737 Full Code [id number]  Roxana Copier, DO ED   11/22/2023 1953  11/25/2023 1920 Full Code [id number]  Doutova,  Mardy Shall, MD ED   10/01/2023 2235 10/14/2023 0046 Full Code [id number]  Corrinne Din, MD ED   09/17/2023 1938 09/20/2023 2053 Full Code [id number]  Walton Guppy, MD ED   01/30/2020 1157 01/30/2020 2002 Full Code [id number]  Tama Fails, PA-C ED   04/16/2018 0009 04/16/2018 1750 Full Code [id number]  Fidencio Hue, MD ED      Advance Directive Documentation    Flowsheet Row Most Recent Value  Type of Advance Directive Healthcare Power of Attorney, Living will  Pre-existing out of facility DNR order (yellow form or pink MOST form) --  "MOST" Form in Place? --       Prognosis:  Unable to determine  Discharge Planning: To Be Determined  Care plan was discussed with  IDT  Thank you for allowing the Palliative Medicine Team to assist in the care of this patient. Mod MDM     Greater than 50%  of this time was spent counseling and coordinating care related to the above assessment and plan.  Lujean Sake, MD  Please contact Palliative Medicine Team phone at [phone] for questions and concerns.

## 2023-12-26 NOTE — Progress Notes (Signed)
 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   Patient was admitted for management of intractable pain requiring IV medications.  Palliative care is consulted.  Radiation oncology following.  Assessment & Plan:   Stage IV NSCLC with cancer related pain  Left hip pain, suspect from sacral met (CT occult, PET +)  R shoulder pain, known met with hx pathologic Fx, fixation, radiation.  --Palliative Care is following for symptom management and goals of care discusssions.  Continues to have severe pain.  Pain management as per palliative care --Radiation Oncology following: Radiation was supposed to start on 12/23/2023 but patient refused it initially but subsequently agreed to start radiation. - Continue Decadron     History of cerebral edema from brain mets:  -Continue dexamethasone  taper   Seizure disorder:  --Continue Keppra    COPD: Appears not on inhalers outpatient --Duonebs PRN   Hypertension:  -- Blood pressure intermittently on the higher side.  Continue metoprolol .    GERD:  --Continue PPI   Psychosocial -- pt's brother with Autism lives with her.  Per notes, she is working to attempt to get him to a facility.  Often caring for her brother causes difficulty for pt getting to her own appointments. --TOC consulted   DVT prophylaxis: SCDs Code Status: Full Family Communication: None at bedside Disposition Plan: Status is: Inpatient Remains inpatient  appropriate because: Of severity of illness    Consultants: Palliative care/radiation oncology  Procedures: None  Antimicrobials: None   Subjective: Patient seen and examined at bedside.  Poor historian.  Does not participate in conversation much.  Still having significant severe pain.  No fever or vomiting reported.  Objective: Vitals:   12/25/23 1346 12/25/23 2241 12/26/23 0124 12/26/23 0523  BP: (!) 146/83 (!) 163/88 (!) 158/92 122/74  Pulse: 66 84 70 65  Resp: 15 18 20 18   Temp: 97.8 F (36.6 C) 98.5 F (36.9 C) 98 F (36.7 C) 98.4 F (36.9 C)  TempSrc: Oral Oral Oral Oral  SpO2: 100% 98% 100% 95%  Weight:      Height:        Intake/Output Summary (Last 24 hours) at 12/26/2023 0813 Last data filed at 12/25/2023 2000 Gross per 24 hour  Intake 720 ml  Output --  Net 720 ml    Filed Weights   12/18/23 1724  Weight: 53.9 kg    Examination:  General: Currently on room air.  No distress.  Poor historian.  Flat affect.  Does not participate in conversation much.  Looks chronically ill and deconditioned.   Respiratory: Decreased breath sounds at bases bilaterally, no wheezing CVS: Mostly rate controlled; S1 and S2 are heard  abdominal: Soft, nontender, distended slightly;, no organomegaly; bowel sounds are normally heard  extremities: Trace lower extremity edema present; no cyanosis.  Right shoulder in a sling.   Data Reviewed: I have personally reviewed following labs and imaging studies  CBC: Recent Labs  Lab 12/20/23 0550 12/21/23 0538 12/23/23 0551  WBC 13.3*  16.5* 16.1*  NEUTROABS  --   --  13.0*  HGB 13.3 13.7 13.8  HCT 41.1 42.9 43.2  MCV 87.4 88.5 89.6  PLT 480* 453* 567*   Basic Metabolic Panel: Recent Labs  Lab 12/20/23 0550 12/21/23 0538 12/23/23 0551  NA 131* 131* 132*  K 4.3 4.5 4.1  CL 99 99 98  CO2 25 22 24   GLUCOSE 117* 98 97  BUN 13 14 19   CREATININE 0.85 0.77 0.76  CALCIUM  9.3 9.4 9.3  MG  --  2.1 1.9  PHOS  --  3.4  --     GFR: Estimated Creatinine Clearance: 63.6 mL/min (by C-G formula based on SCr of 0.76 mg/dL). Liver Function Tests: No results for input(s): "AST", "ALT", "ALKPHOS", "BILITOT", "PROT", "ALBUMIN" in the last 168 hours.  No results for input(s): "LIPASE", "AMYLASE" in the last 168 hours. No results for input(s): "AMMONIA" in the last 168 hours. Coagulation Profile: No results for input(s): "INR", "PROTIME" in the last 168 hours. Cardiac Enzymes: No results for input(s): "CKTOTAL", "CKMB", "CKMBINDEX", "TROPONINI" in the last 168 hours. BNP (last 3 results) No results for input(s): "PROBNP" in the last 8760 hours. HbA1C: No results for input(s): "HGBA1C" in the last 72 hours. CBG: No results for input(s): "GLUCAP" in the last 168 hours. Lipid Profile: No results for input(s): "CHOL", "HDL", "LDLCALC", "TRIG", "CHOLHDL", "LDLDIRECT" in the last 72 hours. Thyroid  Function Tests: No results for input(s): "TSH", "T4TOTAL", "FREET4", "T3FREE", "THYROIDAB" in the last 72 hours. Anemia Panel: No results for input(s): "VITAMINB12", "FOLATE", "FERRITIN", "TIBC", "IRON", "RETICCTPCT" in the last 72 hours. Sepsis Labs: No results for input(s): "PROCALCITON", "LATICACIDVEN" in the last 168 hours.  No results found for this or any previous visit (from the past 240 hours).        Radiology Studies: No results found.      Scheduled Meds:  acetaminophen   1,000 mg Oral Q6H   amLODipine   5 mg Oral Daily   cyclobenzaprine   10 mg Oral QHS   dexamethasone   2 mg Oral Q12H   diclofenac  Sodium  2 g Topical QID   levETIRAcetam   500 mg Oral BID   metoprolol  tartrate  25 mg Oral BID   oxyCODONE   15 mg Oral Q12H   pantoprazole   40 mg Oral Daily   polyethylene glycol  17 g Oral Daily   senna  1 tablet Oral BID   Continuous Infusions:        Audria Leather, MD [facility] 12/26/2023, 8:13 AM

## 2023-12-26 NOTE — Telephone Encounter (Signed)
 Called pt to remind them about upcoming appt. Pt did not answer and could not LVM.

## 2023-12-26 NOTE — Plan of Care (Signed)

## 2023-12-26 NOTE — Progress Notes (Signed)
 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  Patient has not picked up her phone, responded to messages or showed up for many outpatient oncology appointments. - Medical oncology/Dr. Marguerita Shih following  COPD - No current exacerbation or symptoms reported - Continue to monitor closely  Right arm pain and fracture -Likely due to bone mets - Currently has sling on her right arm - Continue pain medications as ordered  Anxiety and depression - Continue mental health support as needed  Code Status Full  Subjective:  Patient seen awake alert and oriented x 3 sitting up in bed.  Discussed with patient reason for no-show at multiple appointments.  Patient reports that she has received many calls from [facility] and does not recall any phone calls from medical oncology.  Of note the phone number that patient has given to me is [phone] which is slightly different from the phone number in her chart.  No other acute distress is noted.    Objective:  Vitals:   12/26/23 0124 12/26/23 0523  BP: (!) 158/92 122/74  Pulse: 70 65  Resp: 20 18  Temp: 98 F (36.7 C) 98.4 F (36.9 C)  SpO2: 100% 95%     Intake/Output Summary (Last 24 hours) at 12/26/2023 1042 Last data filed at 12/25/2023 2000 Gross per 24 hour  Intake 720 ml  Output --  Net 720 ml     PHYSICAL EXAMINATION: ECOG PERFORMANCE STATUS: 1 - Symptomatic but completely ambulatory  Vitals:   12/26/23 0124 12/26/23 0523  BP: (!) 158/92 122/74  Pulse: 70 65  Resp: 20 18  Temp: 98 F (36.7 C) 98.4 F (36.9 C)  SpO2: 100% 95%   Filed Weights   12/18/23 1724  Weight: 118 lb 13.3 oz (53.9 kg)    GENERAL: alert, no distress and comfortable SKIN: skin color, texture, turgor are normal, no rashes or  significant lesions EYES: normal, conjunctiva are pink and non-injected, sclera clear OROPHARYNX: no exudate, no erythema and lips, buccal mucosa, and tongue normal  NECK: supple, thyroid  normal size, non-tender, without nodularity LYMPH: no palpable lymphadenopathy in the cervical, axillary or inguinal LUNGS: clear to auscultation and percussion with normal breathing effort HEART: regular rate & rhythm and no murmurs and no lower extremity edema ABDOMEN: abdomen soft, non-tender and normal bowel sounds MUSCULOSKELETAL: + Right upper extremity with sling intact PSYCH: alert & oriented x 3 with fluent speech NEURO: no focal motor/sensory deficits   All questions were answered. The patient knows to call the clinic with any problems, questions or concerns.   The total time spent in the appointment was 40 minutes encounter with patient including review of chart and various tests results, discussions about plan of care and coordination of care plan  Jacqualin Mate, NP 12/26/2023 10:42 AM    Labs Reviewed:  Lab Results  Component Value Date   WBC 16.1 (H) 12/23/2023   HGB 13.8 12/23/2023   HCT 43.2 12/23/2023   MCV 89.6 12/23/2023   PLT 567 (H) 12/23/2023   Recent Labs    09/18/23 0620 09/19/23 0405 12/12/23 1731 12/13/23 0523 12/14/23 0428 12/18/23 1745 12/20/23 0550 12/21/23 0538 12/23/23 0551  NA 135   < > 132* 135   < > 132* 131* 131* 132*  K 4.3   < >  3.4* 3.1*   < > 3.0* 4.3 4.5 4.1  CL 103   < > 97* 97*   < > 97* 99 99 98  CO2 19*   < > 18* 22   < > 21* 25 22 24   GLUCOSE 116*   < > 141* 133*   < > 100* 117* 98 97  BUN 8   < > <5* <5*   < > 8 13 14 19   CREATININE 0.71   < > 0.90 0.72   < > 0.73 0.85 0.77 0.76  CALCIUM  9.2   < > 10.1 9.1   < > 9.4 9.3 9.4 9.3  GFRNONAA >60   < > >60 >60   < > >60 >60 >60 >60  PROT 7.2   < > 7.7 7.7  --  7.2  --   --   --   ALBUMIN 3.6   < > 3.7 3.7  --  3.8  --   --   --   AST 23   < > 33 30  --  26  --   --   --   ALT 13   < > 14 14   --  17  --   --   --   ALKPHOS 98   < > 74 76  --  59  --   --   --   BILITOT 0.8   < > 1.2 1.0  --  0.4  --   --   --   BILIDIR <0.1  --   --   --   --   --   --   --   --   IBILI NOT CALCULATED  --   --   --   --   --   --   --   --    < > = values in this interval not displayed.    Studies Reviewed:  DG Humerus Right Result Date: 12/19/2023 CLINICAL DATA:  Metastatic lung cancer. Pathologic fracture with pain EXAM: RIGHT HUMERUS - 2+ VIEW COMPARISON:  11/21/2023 FINDINGS: Large lytic lesion in the humeral shaft with spanning intramedullary nail. The distal interlocking screw has backed out, eccentric tip position within the lower humeral shaft is unchanged although there is some new periosteal reaction around the screw. No new fracture when accounting for overlapping external artifact. Generalized osteopenia with permeative appearance throughout the humeral shaft, markedly progressed from 10/01/2023. IMPRESSION: Extensive destructive metastasis in the right humeral shaft with prominent progression since 10/01/2023. The lower interlocking screw of the medullary nail has backed out since 11/21/2023 with regional periosteal reaction likely from motion, tip being eccentric within the lower humeral shaft. Electronically Signed   By: Ronnette Coke M.D.   On: 12/19/2023 04:36   DG HIP UNILAT WITH PELVIS 2-3 VIEWS LEFT Result Date: 12/19/2023 CLINICAL DATA:  Metastatic cancer.  Leg pain. EXAM: DG HIP (WITH OR WITHOUT PELVIS) 3V LEFT COMPARISON:  12/06/2023 FINDINGS: There is no evidence of hip fracture or dislocation. There is no evidence of arthropathy or other focal bone abnormality. Calcified fibroid in the pelvis which is small. IMPRESSION: No acute finding or explanation for hip pain. Electronically Signed   By: Ronnette Coke M.D.   On: 12/19/2023 04:32   MR BRAIN W WO CONTRAST Result Date: 12/14/2023 CLINICAL DATA:  Metastatic disease evaluation. Stage IV non-small cell lung cancer treated with  radiation in January. EXAM: MRI HEAD WITHOUT AND WITH CONTRAST TECHNIQUE: Multiplanar, multiecho pulse  sequences of the brain and surrounding structures were obtained without and with intravenous contrast. CONTRAST:  5.5mL GADAVIST  GADOBUTROL  1 MMOL/ML IV SOLN COMPARISON:  CT 2 days ago.  MRI 10/01/2023 FINDINGS: Brain: Diffusion imaging does not show any acute or subacute infarction or other cause of restricted diffusion. Interval left frontal craniotomy for mass resection. No focal abnormality seen affecting the brainstem or cerebellum. Right cerebral hemisphere shows chronic small-vessel ischemic changes of the white matter as seen previously. On the left, there is been previous frontal craniotomy for partial resection of the previously seen mass lesion. Previous measurements were 3.2 x 3.4 x 2.5 cm. Today, the lesion measures 2.0 x 1.7 x 1.4 cm. Small resection cavity is present superior to the enhancing mass lesion. No second lesion is identified. The amount of regional vasogenic edema appears slightly reduced, with the slight decrease in mass effect and reduction in left to right shift from 1 cm to 4 mm. No hydrocephalus. No extra-axial collection. Vascular: Major vessels at the base of the brain show flow. Skull and upper cervical spine: Otherwise negative Sinuses/Orbits: Clear other than mild mucosal thickening of the left maxillary sinus. Orbits negative. Other: None IMPRESSION: Interval left frontal craniotomy for partial resection of the previously seen mass lesion. Previous measurements were 3.2 x 3.4 x 2.5 cm. Today, the lesion measures 2.0 x 1.7 x 1.4 cm. Small resection cavity is present superior to the enhancing mass lesion. The amount of regional vasogenic edema appears slightly reduced, with the slight decrease in mass effect and reduction in left to right shift from 1 cm to 4 mm. No second lesion is identified. Electronically Signed   By: Bettylou Brunner M.D.   On: 12/14/2023 15:06   EEG  adult Result Date: 12/14/2023 Arleene Lack, MD     12/14/2023 11:58 AM Patient Name: Luella Gardenhire MRN: [id number] Epilepsy Attending: Arleene Lack Referring Physician/Provider: Burton Casey, MD Date: 12/14/2023 Duration: 22.28 mins Patient history: 58yo F with ams. EEG to evaluate for seizure Level of alertness: Awake AEDs during EEG study: None Technical aspects: This EEG study was done with scalp electrodes positioned according to the 10-20 International system of electrode placement. Electrical activity was reviewed with band pass filter of 1-70Hz , sensitivity of 7 uV/mm, display speed of 2mm/sec with a 60Hz  notched filter applied as appropriate. EEG data were recorded continuously and digitally stored.  Video monitoring was available and reviewed as appropriate. Description: The posterior dominant rhythm consists of 7.5 Hz activity of moderate voltage (25-35 uV) seen predominantly in posterior head regions, symmetric and reactive to eye opening and eye closing. EEG showed continuous 3 to 6 Hz theta-delta slowing in left hemisphere. There is also 3-5Hz  sharply contoured theta-delta slowing in left frontal region consistent with breach artifact. Hyperventilation and photic stimulation were not performed.   ABNORMALITY - Breach artifact, left frontal region - Continuous slow, left hemisphere IMPRESSION: This study is suggestive of cortical dysfunction in left frontal region consistent with underlying craniotomy. Additionally there is cortical dysfunction arising from left hemisphere likely secondary to underlying structural abnormality. No seizures or definite epileptiform discharges were seen throughout the recording. Arleene Lack   CT Angio Chest PE W and/or Wo Contrast Result Date: 12/12/2023 CLINICAL DATA:  SVT positive D-dimer history of lung cancer EXAM: CT ANGIOGRAPHY CHEST WITH CONTRAST TECHNIQUE: Multidetector CT imaging of the chest was performed using the standard protocol during bolus  administration of intravenous contrast. Multiplanar CT image reconstructions and MIPs were obtained to  evaluate the vascular anatomy. RADIATION DOSE REDUCTION: This exam was performed according to the departmental dose-optimization program which includes automated exposure control, adjustment of the mA and/or kV according to patient size and/or use of iterative reconstruction technique. CONTRAST:  OMNIPAQUE  IOHEXOL  350 MG/ML SOLN COMPARISON:  PET CT 10/27/2023, CT 09/17/2023 FINDINGS: Cardiovascular: Satisfactory opacification of the pulmonary arteries to the segmental level. No evidence of pulmonary embolism. Mild atherosclerosis. No aneurysm. Normal cardiac size. No pericardial effusion Mediastinum/Nodes: Patent trachea. No thyroid  mass. Right supraclavicular nodes measuring up to 7 mm. Redemonstrated multiple enlarged paratracheal, bilateral hilar and subcarinal adenopathy consistent with metastatic disease. Index right paratracheal node measures about 15 mm on series 6, image 51, compared with 14 mm previously. Right hilar nodes measuring up to 14 mm on series 6, image 72, previously 14 mm. Left hilar nodes measuring up to 14 mm on series 6, image 80, previously 13 mm. Esophagus within normal limits. Lungs/Pleura: Emphysema. Honeycombing and fibrosis at the lung bases. No acute airspace disease. Pleuroparenchymal scarring at the apices. Lobulated right apical lung mass measuring 2.7 x 1.9 cm on series 8, image 19, previously 2.9 by 1.5 cm. Upper Abdomen: No acute abnormality. Musculoskeletal: No chest wall abnormality. No acute or significant osseous findings. Lytic lesion in the mid right humerus on scout image. Review of the MIP images confirms the above findings. IMPRESSION: 1. Negative for acute pulmonary embolus. 2. Emphysema. Fibrosis and honeycombing at the lung bases. No acute airspace disease. 3. Redemonstrated right apical lung mass consistent with known malignancy. Mediastinal and bilateral  hilar adenopathy consistent with metastatic disease, similar to prior. Lytic lesion in the mid right humerus on scout image consistent with metastatic disease. Aortic Atherosclerosis (ICD10-I70.0) and Emphysema (ICD10-J43.9). Electronically Signed   By: Esmeralda Hedge M.D.   On: 12/12/2023 23:40   CT ANGIO HEAD NECK W WO CM Result Date: 12/12/2023 CLINICAL DATA:  Initial evaluation for acute speech changes. EXAM: CT ANGIOGRAPHY HEAD AND NECK WITH AND WITHOUT CONTRAST TECHNIQUE: Multidetector CT imaging of the head and neck was performed using the standard protocol during bolus administration of intravenous contrast. Multiplanar CT image reconstructions and MIPs were obtained to evaluate the vascular anatomy. Carotid stenosis measurements (when applicable) are obtained utilizing NASCET criteria, using the distal internal carotid diameter as the denominator. RADIATION DOSE REDUCTION: This exam was performed according to the departmental dose-optimization program which includes automated exposure control, adjustment of the mA and/or kV according to patient size and/or use of iterative reconstruction technique. CONTRAST:  OMNIPAQUE  IOHEXOL  350 MG/ML SOLN COMPARISON:  Prior study from 10/10/2023 and earlier. FINDINGS: CT HEAD FINDINGS Brain: Persistent vasogenic edema throughout the left frontal lobe, consistent with no in mass lesion in this region. The mass itself is not well visualized on this noncontrast examination. Partial effacement of the left lateral ventricle with 4 mm of left-to-right shift. No other mass lesion or mass effect. No acute large vessel territory infarct. No hydrocephalus. Postoperative changes from prior left frontal craniotomy. Small residual postoperative collection subjacent to the craniotomy bone flap measures up to 4 mm in maximal thickness (series 9, image 27). Vascular: No abnormal hyperdense vessel. Skull: Post craniotomy changes on the left. Soft tissue swelling present at the  right occipital scalp. Calvarium intact. Sinuses/Orbits: Globes and orbital soft tissues within normal limits. Mucosal thickening with air-fluid level noted within the left maxillary sinus. Paranasal sinuses are otherwise clear. No mastoid effusion. Other: None. Review of the MIP images confirms the above findings CTA NECK  FINDINGS Aortic arch: Examination degraded by motion artifact. Visualized aortic arch within normal limits for caliber with standard 3 vessel morphology. No significant stenosis about the origin the great vessels. Right carotid system: Right common and internal carotid arteries are patent within the neck. No visible dissection. Atheromatous plaque about the right carotid bulb without hemodynamically significant greater than 50% stenosis. Left carotid system: Left common and internal carotid arteries are patent within the neck. No visible dissection. No hemodynamically significant stenosis about the left carotid artery system. Vertebral arteries: Both vertebral arteries arise from the subclavian arteries. Left vertebral artery dominant. Vertebral arteries are patent without visible dissection or stenosis. Skeleton: No worrisome osseous lesions. Other neck: No other acute abnormality within the neck. Upper chest: Emphysema. Irregular biapical pleuroparenchymal pleural based right upper lobe mass, consistent with patient's known lung cancer. Scattered enlarged mediastinal, hilar, and right axillary lymph nodes noted, compatible with nodal metastatic disease. Findings better evaluated on concomitant CT of the chest. Review of the MIP images confirms the above findings CTA HEAD FINDINGS Anterior circulation: Both internal carotid arteries widely patent to the termini without stenosis. A1 segments widely patent. Normal anterior communicating artery complex. Both anterior cerebral arteries widely patent to their distal aspects without stenosis. No M1 stenosis or occlusion. Normal MCA bifurcations. Distal  MCA branches well perfused and symmetric. Posterior circulation: Left V4 segment dominant and widely patent to the vertebrobasilar junction. Right vertebral artery terminates in PICA. Both PICA patent. Basilar patent without stenosis. Superior cerebellar and posterior cerebral arteries patent bilaterally. Venous sinuses: Patent allowing for timing the contrast bolus. Anatomic variants: As above.  No aneurysm. Review of the MIP images confirms the above findings IMPRESSION: CT HEAD: 1. Extensive vasogenic edema throughout the left frontal lobe, consistent with known mass in this region. The mass itself is not well visualized on this noncontrast examination. Associated regional mass effect with 4 mm of left-to-right shift. 2. Postoperative changes from prior left frontal craniotomy with small residual postoperative collection subjacent to the craniotomy bone flap as above. 3. No other acute intracranial abnormality. CTA HEAD AND NECK: 1. Negative CTA of the head and neck. No large vessel occlusion or other emergent finding. 2. Atheromatous plaque about the right carotid bulb without hemodynamically significant stenosis. 3. Irregular pleural based right upper lobe mass, consistent with patient's known lung cancer. Scattered enlarged mediastinal, hilar, and right axillary lymph nodes, compatible with nodal metastatic disease. Findings better evaluated on concomitant CT of the chest. 4. Aortic Atherosclerosis (ICD10-I70.0) and Emphysema (ICD10-J43.9). Electronically Signed   By: Virgia Griffins M.D.   On: 12/12/2023 23:28   DG Pelvis Portable Result Date: 12/12/2023 CLINICAL DATA:  Lung cancer with metastasis to bone.  Left hip pain. EXAM: PORTABLE PELVIS 1-2 VIEWS COMPARISON:  PET CT 10/27/2023 FINDINGS: Patient's known left sacral lesion is radiographically occult, also CT occult on prior PET. No evidence of acute fracture. Both hips are normally located. Pubic symphysis and sacroiliac joints are congruent.  IMPRESSION: 1. Patient's known left sacral lesion is radiographically occult. 2. No evidence of pathologic fracture. Electronically Signed   By: Chadwick Colonel M.D.   On: 12/12/2023 22:06   DG Chest Portable 1 View Result Date: 12/12/2023 CLINICAL DATA:  History of lung cancer with Mets to bone. Left hip pain. EXAM: PORTABLE CHEST 1 VIEW COMPARISON:  Chest radiograph 10/01/2023. CT 09/17/2023, PET 10/27/2023 FINDINGS: Stable heart size and mediastinal contours. Aortic atherosclerosis. Chronic lung disease with subpleural reticulation, peripheral and basilar predominant. Nodular airspace disease in  the right lung apex is faintly visualized by radiograph. No evidence of focal bone lesion by radiograph. IMPRESSION: 1. No radiographic evidence of bone lesion. 2. Chronic lung disease. Nodule at the right lung apex is faintly visualized by radiograph. Electronically Signed   By: Chadwick Colonel M.D.   On: 12/12/2023 22:05   CT Lumbar Spine Wo Contrast Result Date: 12/06/2023 CLINICAL DATA:  Low back pain EXAM: CT LUMBAR SPINE WITHOUT CONTRAST TECHNIQUE: Multidetector CT imaging of the lumbar spine was performed without intravenous contrast administration. Multiplanar CT image reconstructions were also generated. RADIATION DOSE REDUCTION: This exam was performed according to the departmental dose-optimization program which includes automated exposure control, adjustment of the mA and/or kV according to patient size and/or use of iterative reconstruction technique. COMPARISON:  CT 09/17/2023 FINDINGS: Segmentation: Partial sacralization of the L5 vertebral body. Alignment: Normal Vertebrae: No acute fracture or focal pathologic process. Paraspinal and other soft tissues: Negative. 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 Disc levels: Maintained IMPRESSION: No acute bony abnormality. Electronically Signed   By: Janeece Mechanic M.D.   On: 12/06/2023 20:30   CT FEMUR LEFT WO CONTRAST Result Date: 12/06/2023 CLINICAL DATA:  Hip  pain EXAM: CT OF THE LOWER LEFT EXTREMITY WITHOUT CONTRAST TECHNIQUE: Multidetector CT imaging of the lower left extremity was performed according to the standard protocol. RADIATION DOSE REDUCTION: This exam was performed according to the departmental dose-optimization program which includes automated exposure control, adjustment of the mA and/or kV according to patient size and/or use of iterative reconstruction technique. COMPARISON:  Plain films today. FINDINGS: Bones/Joint/Cartilage No acute bony abnormality. Specifically, no fracture, subluxation, or dislocation. No joint effusion. Ligaments Suboptimally assessed by CT. Muscles and Tendons Negative Soft tissues Negative IMPRESSION: No acute bony abnormality. Electronically Signed   By: Janeece Mechanic M.D.   On: 12/06/2023 20:27   DG Hip Unilat With Pelvis 2-3 Views Left Result Date: 12/06/2023 CLINICAL DATA:  Hip pain EXAM: DG HIP (WITH OR WITHOUT PELVIS) 2-3V LEFT COMPARISON:  None Available. FINDINGS: There is no evidence of hip fracture or dislocation. There is no evidence of arthropathy or other focal bone abnormality. IMPRESSION: Negative. Electronically Signed   By: Tyron Gallon M.D.   On: 12/06/2023 18:39

## 2023-12-26 NOTE — Progress Notes (Signed)
 Mobility Specialist - Progress Note   12/26/23 0909  Mobility  Activity Ambulated independently in hallway  Level of Assistance Independent  Assistive Device None  Distance Ambulated (ft) 460 ft  Range of Motion/Exercises Active  Activity Response Tolerated well  Mobility Referral Yes  Mobility visit 1 Mobility  Mobility Specialist Start Time (ACUTE ONLY) 0859  Mobility Specialist Stop Time (ACUTE ONLY) 0909  Mobility Specialist Time Calculation (min) (ACUTE ONLY) 10 min   Pt was found sitting EOB and agreeable to ambulate. At EOS returned to sit EOB with all needs met. Call bell in reach.  Lorna Rose Mobility Specialist

## 2023-12-27 ENCOUNTER — Inpatient Hospital Stay: Payer: MEDICAID | Admitting: Family

## 2023-12-27 ENCOUNTER — Other Ambulatory Visit (HOSPITAL_COMMUNITY): Payer: Self-pay

## 2023-12-27 ENCOUNTER — Inpatient Hospital Stay (INDEPENDENT_AMBULATORY_CARE_PROVIDER_SITE_OTHER): Payer: MEDICAID | Admitting: Primary Care

## 2023-12-27 ENCOUNTER — Other Ambulatory Visit (HOSPITAL_BASED_OUTPATIENT_CLINIC_OR_DEPARTMENT_OTHER): Payer: Self-pay

## 2023-12-27 ENCOUNTER — Other Ambulatory Visit: Payer: Self-pay

## 2023-12-27 ENCOUNTER — Ambulatory Visit: Payer: MEDICAID | Admitting: Radiation Oncology

## 2023-12-27 ENCOUNTER — Encounter: Payer: Self-pay | Admitting: Internal Medicine

## 2023-12-27 DIAGNOSIS — Z515 Encounter for palliative care: Secondary | ICD-10-CM | POA: Diagnosis not present

## 2023-12-27 DIAGNOSIS — G893 Neoplasm related pain (acute) (chronic): Secondary | ICD-10-CM | POA: Diagnosis not present

## 2023-12-27 DIAGNOSIS — C3490 Malignant neoplasm of unspecified part of unspecified bronchus or lung: Secondary | ICD-10-CM | POA: Diagnosis not present

## 2023-12-27 DIAGNOSIS — Z79899 Other long term (current) drug therapy: Secondary | ICD-10-CM | POA: Diagnosis not present

## 2023-12-27 MED ORDER — ACETAMINOPHEN 500 MG PO TABS: 1000.0000 mg | ORAL_TABLET | Freq: Four times a day (QID) | ORAL | Status: DC

## 2023-12-27 MED ORDER — OXYCODONE HCL 10 MG PO TABS
10.0000 mg | ORAL_TABLET | Freq: Four times a day (QID) | ORAL | 0 refills | Status: DC | PRN
Start: 2023-12-27 — End: 2024-01-28
  Filled 2023-12-27 (×2): qty 20, 5d supply, fill #0

## 2023-12-27 MED ORDER — CYCLOBENZAPRINE HCL 10 MG PO TABS
10.0000 mg | ORAL_TABLET | Freq: Every day | ORAL | 0 refills | Status: AC
  Filled 2023-12-27 (×2): qty 10, 10d supply, fill #0

## 2023-12-27 MED ORDER — SENNA 8.6 MG PO TABS
1.0000 | ORAL_TABLET | Freq: Two times a day (BID) | ORAL | 0 refills | Status: DC
  Filled 2023-12-27 (×2): qty 30, 15d supply, fill #0

## 2023-12-27 MED ORDER — OXYCODONE HCL ER 15 MG PO T12A
15.0000 mg | EXTENDED_RELEASE_TABLET | Freq: Three times a day (TID) | ORAL | 0 refills | Status: DC
  Filled 2023-12-27: qty 20, 7d supply, fill #0
  Filled 2023-12-27: qty 20, 8d supply, fill #0
  Filled 2023-12-28 – 2024-01-06 (×2): qty 20, 7d supply, fill #0

## 2023-12-27 MED ORDER — POLYETHYLENE GLYCOL 3350 17 G PO PACK
17.0000 g | PACK | Freq: Every day | ORAL | 0 refills | Status: DC
  Filled 2023-12-27 (×2): qty 14, 14d supply, fill #0

## 2023-12-27 MED ORDER — AMLODIPINE BESYLATE 5 MG PO TABS
5.0000 mg | ORAL_TABLET | Freq: Every day | ORAL | 0 refills | Status: DC
  Filled 2023-12-27 (×2): qty 30, 30d supply, fill #0

## 2023-12-27 NOTE — Radiation Completion Notes (Signed)
 Patient Name: JAELA, YEPEZ MRN: [id number] Date of Birth: Feb 02, 1966 Referring Physician: Madelyn Schick, M.D. Date of Service: 2023-12-27 Radiation Oncologist: Bartholome Ligas, M.D. [facility] [place]                             RADIATION ONCOLOGY END OF TREATMENT NOTE     Diagnosis: C79.51 Secondary malignant neoplasm of bone Staging on 2023-09-22: Primary adenocarcinoma of upper lobe of right lung (HCC) T=cT1c, N=cN3, M=cM1c2 Intent: Palliative     ==========DELIVERED PLANS==========  First Treatment Date: 2023-12-21 Last Treatment Date: 2023-12-26   Plan Name: Pelvis_L Site: Hip, Left Technique: 3D Mode: Photon Dose Per Fraction: 5 Gy Prescribed Dose (Delivered / Prescribed): 20 Gy / 20 Gy Prescribed Fxs (Delivered / Prescribed): 4 / 4     ==========ON TREATMENT VISIT DATES========== 2023-12-26     ==========UPCOMING VISITS==========       ==========APPENDIX - ON TREATMENT VISIT NOTES==========   See weekly On Treatment Notes in Epic for details in the Media tab (listed as Progress notes on the On Treatment Visit Dates listed above).

## 2023-12-27 NOTE — Progress Notes (Signed)
 Daily Progress Note   Patient Name: Regina [name]       Date: 12/27/2023 DOB: March 02, 1966  Age: 58 y.o. MRN#: [id number] Attending Physician: Audria Leather, MD Primary Care Physician: Marius Siemens, NP Admit Date: 12/18/2023 Length of Stay: 8 days  Reason for Consultation/Follow-up: Establishing goals of care  Subjective:   CC: Demands to leave the hospital now. Following up regarding complex medical decision making.   Subjective:  Reviewed EMR prior to presenting to bedside.  At time of EMR review patient received as needed oxycodone  15 mg x 2 doses and as needed IV Dilaudid  1 mg every 2 hour x 7 doses.  Patient was started on OxyContin  15 mg every 8 hours on 12/26/2023. Received epic message from RN noting that patient is wanting to leave now to go home to be with her brother.  Discussed care with hospitalist as well.  From a medical standpoint patient has stabilized.  Presented to bedside to see patient.  Patient up walking around without complaints.  Again introduced myself as a member of the palliative medicine team.  Patient demanding that she be allowed to leave the hospital now because she needs to go home to assist in her brother's care.  Her brother has autism and she noted that he had a seizure recently.  Noted deferred to hospitalist though plan would be for her to discharge today as per her request if she feels her pain is managed on current medications.  Patient states that it is, denies any adverse effects concerns, and she would like to leave the hospital now.  Noted would inform RN and hospitalist.  Recommended patient follow-up with PMT at [facility] as has been previously recommended.  Vital Signs:  BP (!) 139/92 (BP Location: Left Arm)   Pulse 82   Temp 98.4 F (36.9 C) (Oral)   Resp 14   Ht 5\' 6"  (1.676 m)   Wt 53.9 kg   LMP 06/06/2010   SpO2 100%   BMI 19.18 kg/m   Physical Exam: General: NAD, alert, walking around room, chronically ill-appearing, cachectic,  frail Cardiovascular: RRR Respiratory: no increased work of breathing noted, not in respiratory distress Neuro: A&Ox4 Psych: Frustrated  Imaging:  I personally reviewed recent imaging.   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  Of note patient had recent admission for 4/7-10 for management of encephalopathy related to cerebral edema from metastatic disease.  Patient initially refusing admission upon presentation to ER.  Palliative medicine team consulted to assist with complex medical decision making. Of note patient has been seen by palliative medicine team during prior admissions.  Patient had also been referred to outpatient palliative medicine follow-up at [facility] though is noted to have difficulties getting to appointments as she assist with care of her autistic brother who lives with her.  Recommendations/Plan: # Complex medical decision making/goals of care:  - Patient continued to elect for full scope of therapies during hospitalization.  Patient received radiation therapy during this hospitalization.  Recommend patient follow-up with PMD at [facility] continue symptom management and complex medical decision making.  -  Code Status: Full Code  # Symptom management:  Patient is receiving these palliative interventions for symptom management with an intent to improve quality of life.   - Pain, severe acute on chronic pain   - Continue OxyContin  15 mg every 8 hours scheduled   - Continue oxycodone  10-15 mg every 4 hours as needed  # Psychosocial Support:  - Niece  # Discharge Planning: Home with  Palliative Services  - Recommend patient follow-up with PMT at [facility]  Discussed with: hospitalist, RN, patient   Thank you for allowing the palliative care team to participate in the care Regina [name].  Barnett Libel, DO Palliative Care Provider PMT # [phone]  If patient remains symptomatic despite maximum doses, please call PMT at [phone] between 0700 and 1900. Outside of these hours, please call attending, as PMT does not have night coverage.

## 2023-12-27 NOTE — Plan of Care (Signed)
  Problem: Education: Goal: Knowledge of risk factors and measures for prevention of condition will improve 12/27/2023 1041 by Shayla Delude, RN Outcome: Completed/Met 12/27/2023 0827 by Shayla Delude, RN Outcome: Progressing   Problem: Coping: Goal: Psychosocial and spiritual needs will be supported 12/27/2023 1041 by Shayla Delude, RN Outcome: Completed/Met 12/27/2023 0827 by Shayla Delude, RN Outcome: Progressing   Problem: Respiratory: Goal: Will maintain a patent airway 12/27/2023 1041 by Shayla Delude, RN Outcome: Completed/Met 12/27/2023 0827 by Shayla Delude, RN Outcome: Progressing Goal: Complications related to the disease process, condition or treatment will be avoided or minimized 12/27/2023 1041 by Shayla Delude, RN Outcome: Completed/Met 12/27/2023 0827 by Shayla Delude, RN Outcome: Progressing   Problem: Education: Goal: Knowledge of General Education information will improve Description: Including pain rating scale, medication(s)/side effects and non-pharmacologic comfort measures 12/27/2023 1041 by Shayla Delude, RN Outcome: Completed/Met 12/27/2023 0827 by Shayla Delude, RN Outcome: Progressing   Problem: Health Behavior/Discharge Planning: Goal: Ability to manage health-related needs will improve 12/27/2023 1041 by Shayla Delude, RN Outcome: Completed/Met 12/27/2023 0827 by Shayla Delude, RN Outcome: Progressing   Problem: Clinical Measurements: Goal: Ability to maintain clinical measurements within normal limits will improve 12/27/2023 1041 by Shayla Delude, RN Outcome: Completed/Met 12/27/2023 0827 by Shayla Delude, RN Outcome: Progressing Goal: Will remain free from infection 12/27/2023 1041 by Shayla Delude, RN Outcome: Completed/Met 12/27/2023 0827 by Shayla Delude, RN Outcome: Progressing Goal: Diagnostic test results will improve 12/27/2023 1041 by Shayla Delude, RN Outcome: Completed/Met 12/27/2023 0827 by Shayla Delude, RN Outcome: Progressing Goal: Respiratory complications will improve 12/27/2023 1041 by Shayla Delude, RN Outcome: Completed/Met 12/27/2023 0827 by Shayla Delude, RN Outcome: Progressing Goal: Cardiovascular complication will be avoided 12/27/2023 1041 by Shayla Delude, RN Outcome: Completed/Met 12/27/2023 0827 by Shayla Delude, RN Outcome: Progressing   Problem: Activity: Goal: Risk for activity intolerance will decrease 12/27/2023 1041 by Shayla Delude, RN Outcome: Completed/Met 12/27/2023 0827 by Shayla Delude, RN Outcome: Progressing   Problem: Nutrition: Goal: Adequate nutrition will be maintained 12/27/2023 1041 by Shayla Delude, RN Outcome: Completed/Met 12/27/2023 0827 by Shayla Delude, RN Outcome: Progressing   Problem: Coping: Goal: Level of anxiety will decrease 12/27/2023 1041 by Shayla Delude, RN Outcome: Completed/Met 12/27/2023 0827 by Shayla Delude, RN Outcome: Progressing   Problem: Elimination: Goal: Will not experience complications related to bowel motility 12/27/2023 1041 by Shayla Delude, RN Outcome: Completed/Met 12/27/2023 0827 by Shayla Delude, RN Outcome: Progressing Goal: Will not experience complications related to urinary retention 12/27/2023 1041 by Shayla Delude, RN Outcome: Completed/Met 12/27/2023 0827 by Shayla Delude, RN Outcome: Progressing   Problem: Pain Managment: Goal: General experience of comfort will improve and/or be controlled 12/27/2023 1041 by Shayla Delude, RN Outcome: Completed/Met 12/27/2023 0827 by Shayla Delude, RN Outcome: Progressing   Problem: Safety: Goal: Ability to remain free from injury will improve 12/27/2023 1041 by Shayla Delude, RN Outcome: Completed/Met 12/27/2023 0827 by Shayla Delude, RN Outcome: Progressing   Problem: Skin Integrity: Goal: Risk for impaired skin integrity will decrease 12/27/2023 1041 by Shayla Delude, RN Outcome: Completed/Met 12/27/2023 0827 by  Shayla Delude, RN Outcome: Progressing

## 2023-12-27 NOTE — Plan of Care (Signed)

## 2023-12-27 NOTE — Discharge Summary (Signed)
 Physician Discharge Summary  Regina [name] ZOX:[id number] DOB: 27-Oct-1965 DOA: 12/18/2023  PCP: Marius Siemens, NP  Admit date: 12/18/2023 Discharge date: 12/27/2023  Admitted From: Home Disposition: Home  Recommendations for Outpatient Follow-up:  Follow up with PCP in 1 week with repeat CBC/BMP Outpatient follow-up with oncology and palliative care. 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    Patient was admitted for management of intractable pain requiring IV medications.  Palliative care is consulted.  Radiation oncology following.  Patient has completed inpatient radiation treatment.  She is still in significant pain but wants to go home today and palliative care has cleared the patient for discharge on current pain medication regimen.  Discharge patient home today.  Outpatient follow-up with PCP/oncology/palliative care.  Overall prognosis is poor.  Discharge Diagnoses:   Stage IV NSCLC with cancer related pain  Left hip pain, suspect from sacral met (CT occult, PET +)  R shoulder pain, known met with hx pathologic Fx, fixation, radiation.  --Palliative Care is following for symptom management and goals of care discusssions.  Continues to have severe pain.  Pain management as per palliative care --Radiation Oncology following: Radiation was  supposed to start on 12/23/2023 but patient refused it initially but subsequently agreed to start radiation. - Continue Decadron  - Patient has completed inpatient radiation treatment.  She is still in significant pain but wants to go home today and palliative care has cleared the patient for discharge on current pain medication regimen.  Discharge patient home today.  Outpatient follow-up with PCP/oncology/palliative care.  Overall prognosis is poor.     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   Hypertension:  -- Blood pressure intermittently on the higher side.  Continue metoprolol  and amlodipine .     GERD:  --Continue PPI   Psychosocial -- pt's brother with Autism lives with her.  Per notes, she is working to attempt to get him to a facility.  Often caring for her brother causes difficulty for pt getting to her own appointments. --TOC consulted   Discharge Instructions  Discharge Instructions     Diet general   Complete by: As directed    Increase activity slowly   Complete by: As directed       Allergies as of 12/27/2023       Reactions   Pork Allergy Nausea And Vomiting   Bee Venom Hives        Medication List     TAKE these medications    acetaminophen  500 MG tablet Commonly known as: TYLENOL  Take 2 tablets (1,000 mg total) by mouth every 6 (six) hours.   amLODipine  5 MG tablet Commonly known as: NORVASC  Take 1 tablet (5 mg total) by mouth daily.   cyclobenzaprine  10 MG tablet Commonly known as: FLEXERIL  Take 1  tablet (10 mg total) by mouth at bedtime for 10 days.   dexamethasone  2 MG tablet Commonly known as: DECADRON  Take 2 tablets (4 mg total) by mouth every 6 (six) hours for 3 days, THEN 1.5 tablets (3 mg total) every 8 (eight) hours for 5 days, THEN 1 tablet (2 mg total) every 12 (twelve) hours and stay on this till seen  by medical oncology/radiation oncology. Start taking on: December 15, 2023   diclofenac  Sodium 1 % Gel Commonly known as: Voltaren  Apply 2 g topically 4 (four) times daily.   levETIRAcetam  500 MG tablet Commonly known as: KEPPRA  Take 1 tablet (500 mg total) by mouth 2 (two) times daily.   metoprolol  tartrate 25 MG tablet Commonly known as: LOPRESSOR  Take 1 tablet (25 mg total) by mouth 2 (two) times daily.   oxyCODONE  15 mg 12 hr tablet Commonly known as: OXYCONTIN  Take 1 tablet (15 mg total) by mouth every 8 (eight) hours.   Oxycodone  HCl 10 MG Tabs Take 1 tablet (10 mg total) by mouth every 6 (six) hours as needed for moderate pain (pain score 4-6).   pantoprazole  40 MG tablet Commonly known as: Protonix  Take 1 tablet (40 mg total) by mouth daily.   polyethylene glycol 17 g packet Commonly known as: MIRALAX  / GLYCOLAX  Take 17 g by mouth daily.   senna 8.6 MG Tabs tablet Commonly known as: SENOKOT Take 1 tablet (8.6 mg total) by mouth 2 (two) times daily.        Follow-up Information     Marius Siemens, NP. Schedule an appointment as soon as possible for a visit in 1 week(s).   Specialty: Internal Medicine Contact information: [address] [place] [address] [phone]         Marlene Simas, MD Follow up.   Specialty: Oncology Why: At earliest convenience Contact information: [address] [place] [address] [phone]                Allergies  Allergen Reactions   Pork Allergy Nausea And Vomiting   Bee Venom Hives    Consultations: Oncology/palliative care/radiation oncology  Procedures/Studies: DG Humerus Right Result Date: 12/19/2023 CLINICAL DATA:  Metastatic lung cancer. Pathologic fracture with pain EXAM: RIGHT HUMERUS - 2+ VIEW COMPARISON:  11/21/2023 FINDINGS: Large lytic lesion in the humeral shaft with spanning intramedullary nail. The distal interlocking screw has backed out, eccentric tip position  within the lower humeral shaft is unchanged although there is some new periosteal reaction around the screw. No new fracture when accounting for overlapping external artifact. Generalized osteopenia with permeative appearance throughout the humeral shaft, markedly progressed from 10/01/2023. IMPRESSION: Extensive destructive metastasis in the right humeral shaft with prominent progression since 10/01/2023. The lower interlocking screw of the medullary nail has backed out since 11/21/2023 with regional periosteal reaction likely from motion, tip being eccentric within the lower humeral shaft. Electronically Signed   By: Ronnette Coke M.D.   On: 12/19/2023 04:36   DG HIP UNILAT WITH PELVIS 2-3 VIEWS LEFT Result Date: 12/19/2023 CLINICAL DATA:  Metastatic cancer.  Leg pain. EXAM: DG HIP (WITH OR WITHOUT PELVIS) 3V LEFT COMPARISON:  12/06/2023 FINDINGS: There is no evidence of hip fracture or dislocation. There is no evidence of arthropathy or other focal bone abnormality. Calcified fibroid in the pelvis which is small. IMPRESSION: No acute finding or explanation for hip pain. Electronically Signed   By: Ronnette Coke M.D.   On: 12/19/2023 04:32   MR BRAIN W  WO CONTRAST Result Date: 12/14/2023 CLINICAL DATA:  Metastatic disease evaluation. Stage IV non-small cell lung cancer treated with radiation in January. EXAM: MRI HEAD WITHOUT AND WITH CONTRAST TECHNIQUE: Multiplanar, multiecho pulse sequences of the brain and surrounding structures were obtained without and with intravenous contrast. CONTRAST:  5.5mL GADAVIST  GADOBUTROL  1 MMOL/ML IV SOLN COMPARISON:  CT 2 days ago.  MRI 10/01/2023 FINDINGS: Brain: Diffusion imaging does not show any acute or subacute infarction or other cause of restricted diffusion. Interval left frontal craniotomy for mass resection. No focal abnormality seen affecting the brainstem or cerebellum. Right cerebral hemisphere shows chronic small-vessel ischemic changes of the white matter  as seen previously. On the left, there is been previous frontal craniotomy for partial resection of the previously seen mass lesion. Previous measurements were 3.2 x 3.4 x 2.5 cm. Today, the lesion measures 2.0 x 1.7 x 1.4 cm. Small resection cavity is present superior to the enhancing mass lesion. No second lesion is identified. The amount of regional vasogenic edema appears slightly reduced, with the slight decrease in mass effect and reduction in left to right shift from 1 cm to 4 mm. No hydrocephalus. No extra-axial collection. Vascular: Major vessels at the base of the brain show flow. Skull and upper cervical spine: Otherwise negative Sinuses/Orbits: Clear other than mild mucosal thickening of the left maxillary sinus. Orbits negative. Other: None IMPRESSION: Interval left frontal craniotomy for partial resection of the previously seen mass lesion. Previous measurements were 3.2 x 3.4 x 2.5 cm. Today, the lesion measures 2.0 x 1.7 x 1.4 cm. Small resection cavity is present superior to the enhancing mass lesion. The amount of regional vasogenic edema appears slightly reduced, with the slight decrease in mass effect and reduction in left to right shift from 1 cm to 4 mm. No second lesion is identified. Electronically Signed   By: Bettylou Brunner M.D.   On: 12/14/2023 15:06   EEG adult Result Date: 12/14/2023 Arleene Lack, MD     12/14/2023 11:58 AM Patient Name: Keymora Grillot MRN: [id number] Epilepsy Attending: Arleene Lack Referring Physician/Provider: Burton Casey, MD Date: 12/14/2023 Duration: 22.28 mins Patient history: 58yo F with ams. EEG to evaluate for seizure Level of alertness: Awake AEDs during EEG study: None Technical aspects: This EEG study was done with scalp electrodes positioned according to the 10-20 International system of electrode placement. Electrical activity was reviewed with band pass filter of 1-70Hz , sensitivity of 7 uV/mm, display speed of 45mm/sec with a 60Hz  notched  filter applied as appropriate. EEG data were recorded continuously and digitally stored.  Video monitoring was available and reviewed as appropriate. Description: The posterior dominant rhythm consists of 7.5 Hz activity of moderate voltage (25-35 uV) seen predominantly in posterior head regions, symmetric and reactive to eye opening and eye closing. EEG showed continuous 3 to 6 Hz theta-delta slowing in left hemisphere. There is also 3-5Hz  sharply contoured theta-delta slowing in left frontal region consistent with breach artifact. Hyperventilation and photic stimulation were not performed.   ABNORMALITY - Breach artifact, left frontal region - Continuous slow, left hemisphere IMPRESSION: This study is suggestive of cortical dysfunction in left frontal region consistent with underlying craniotomy. Additionally there is cortical dysfunction arising from left hemisphere likely secondary to underlying structural abnormality. No seizures or definite epileptiform discharges were seen throughout the recording. Arleene Lack   CT Angio Chest PE W and/or Wo Contrast Result Date: 12/12/2023 CLINICAL DATA:  SVT positive D-dimer history of lung cancer EXAM:  CT ANGIOGRAPHY CHEST WITH CONTRAST TECHNIQUE: Multidetector CT imaging of the chest was performed using the standard protocol during bolus administration of intravenous contrast. Multiplanar CT image reconstructions and MIPs were obtained to evaluate the vascular anatomy. RADIATION DOSE REDUCTION: This exam was performed according to the departmental dose-optimization program which includes automated exposure control, adjustment of the mA and/or kV according to patient size and/or use of iterative reconstruction technique. CONTRAST:  OMNIPAQUE  IOHEXOL  350 MG/ML SOLN COMPARISON:  PET CT 10/27/2023, CT 09/17/2023 FINDINGS: Cardiovascular: Satisfactory opacification of the pulmonary arteries to the segmental level. No evidence of pulmonary embolism. Mild  atherosclerosis. No aneurysm. Normal cardiac size. No pericardial effusion Mediastinum/Nodes: Patent trachea. No thyroid  mass. Right supraclavicular nodes measuring up to 7 mm. Redemonstrated multiple enlarged paratracheal, bilateral hilar and subcarinal adenopathy consistent with metastatic disease. Index right paratracheal node measures about 15 mm on series 6, image 51, compared with 14 mm previously. Right hilar nodes measuring up to 14 mm on series 6, image 72, previously 14 mm. Left hilar nodes measuring up to 14 mm on series 6, image 80, previously 13 mm. Esophagus within normal limits. Lungs/Pleura: Emphysema. Honeycombing and fibrosis at the lung bases. No acute airspace disease. Pleuroparenchymal scarring at the apices. Lobulated right apical lung mass measuring 2.7 x 1.9 cm on series 8, image 19, previously 2.9 by 1.5 cm. Upper Abdomen: No acute abnormality. Musculoskeletal: No chest wall abnormality. No acute or significant osseous findings. Lytic lesion in the mid right humerus on scout image. Review of the MIP images confirms the above findings. IMPRESSION: 1. Negative for acute pulmonary embolus. 2. Emphysema. Fibrosis and honeycombing at the lung bases. No acute airspace disease. 3. Redemonstrated right apical lung mass consistent with known malignancy. Mediastinal and bilateral hilar adenopathy consistent with metastatic disease, similar to prior. Lytic lesion in the mid right humerus on scout image consistent with metastatic disease. Aortic Atherosclerosis (ICD10-I70.0) and Emphysema (ICD10-J43.9). Electronically Signed   By: Esmeralda Hedge M.D.   On: 12/12/2023 23:40   CT ANGIO HEAD NECK W WO CM Result Date: 12/12/2023 CLINICAL DATA:  Initial evaluation for acute speech changes. EXAM: CT ANGIOGRAPHY HEAD AND NECK WITH AND WITHOUT CONTRAST TECHNIQUE: Multidetector CT imaging of the head and neck was performed using the standard protocol during bolus administration of intravenous contrast.  Multiplanar CT image reconstructions and MIPs were obtained to evaluate the vascular anatomy. Carotid stenosis measurements (when applicable) are obtained utilizing NASCET criteria, using the distal internal carotid diameter as the denominator. RADIATION DOSE REDUCTION: This exam was performed according to the departmental dose-optimization program which includes automated exposure control, adjustment of the mA and/or kV according to patient size and/or use of iterative reconstruction technique. CONTRAST:  OMNIPAQUE  IOHEXOL  350 MG/ML SOLN COMPARISON:  Prior study from 10/10/2023 and earlier. FINDINGS: CT HEAD FINDINGS Brain: Persistent vasogenic edema throughout the left frontal lobe, consistent with no in mass lesion in this region. The mass itself is not well visualized on this noncontrast examination. Partial effacement of the left lateral ventricle with 4 mm of left-to-right shift. No other mass lesion or mass effect. No acute large vessel territory infarct. No hydrocephalus. Postoperative changes from prior left frontal craniotomy. Small residual postoperative collection subjacent to the craniotomy bone flap measures up to 4 mm in maximal thickness (series 9, image 27). Vascular: No abnormal hyperdense vessel. Skull: Post craniotomy changes on the left. Soft tissue swelling present at the right occipital scalp. Calvarium intact. Sinuses/Orbits: Globes and orbital soft tissues within normal  limits. Mucosal thickening with air-fluid level noted within the left maxillary sinus. Paranasal sinuses are otherwise clear. No mastoid effusion. Other: None. Review of the MIP images confirms the above findings CTA NECK FINDINGS Aortic arch: Examination degraded by motion artifact. Visualized aortic arch within normal limits for caliber with standard 3 vessel morphology. No significant stenosis about the origin the great vessels. Right carotid system: Right common and internal carotid arteries are patent within the  neck. No visible dissection. Atheromatous plaque about the right carotid bulb without hemodynamically significant greater than 50% stenosis. Left carotid system: Left common and internal carotid arteries are patent within the neck. No visible dissection. No hemodynamically significant stenosis about the left carotid artery system. Vertebral arteries: Both vertebral arteries arise from the subclavian arteries. Left vertebral artery dominant. Vertebral arteries are patent without visible dissection or stenosis. Skeleton: No worrisome osseous lesions. Other neck: No other acute abnormality within the neck. Upper chest: Emphysema. Irregular biapical pleuroparenchymal pleural based right upper lobe mass, consistent with patient's known lung cancer. Scattered enlarged mediastinal, hilar, and right axillary lymph nodes noted, compatible with nodal metastatic disease. Findings better evaluated on concomitant CT of the chest. Review of the MIP images confirms the above findings CTA HEAD FINDINGS Anterior circulation: Both internal carotid arteries widely patent to the termini without stenosis. A1 segments widely patent. Normal anterior communicating artery complex. Both anterior cerebral arteries widely patent to their distal aspects without stenosis. No M1 stenosis or occlusion. Normal MCA bifurcations. Distal MCA branches well perfused and symmetric. Posterior circulation: Left V4 segment dominant and widely patent to the vertebrobasilar junction. Right vertebral artery terminates in PICA. Both PICA patent. Basilar patent without stenosis. Superior cerebellar and posterior cerebral arteries patent bilaterally. Venous sinuses: Patent allowing for timing the contrast bolus. Anatomic variants: As above.  No aneurysm. Review of the MIP images confirms the above findings IMPRESSION: CT HEAD: 1. Extensive vasogenic edema throughout the left frontal lobe, consistent with known mass in this region. The mass itself is not well  visualized on this noncontrast examination. Associated regional mass effect with 4 mm of left-to-right shift. 2. Postoperative changes from prior left frontal craniotomy with small residual postoperative collection subjacent to the craniotomy bone flap as above. 3. No other acute intracranial abnormality. CTA HEAD AND NECK: 1. Negative CTA of the head and neck. No large vessel occlusion or other emergent finding. 2. Atheromatous plaque about the right carotid bulb without hemodynamically significant stenosis. 3. Irregular pleural based right upper lobe mass, consistent with patient's known lung cancer. Scattered enlarged mediastinal, hilar, and right axillary lymph nodes, compatible with nodal metastatic disease. Findings better evaluated on concomitant CT of the chest. 4. Aortic Atherosclerosis (ICD10-I70.0) and Emphysema (ICD10-J43.9). Electronically Signed   By: Virgia Griffins M.D.   On: 12/12/2023 23:28   DG Pelvis Portable Result Date: 12/12/2023 CLINICAL DATA:  Lung cancer with metastasis to bone.  Left hip pain. EXAM: PORTABLE PELVIS 1-2 VIEWS COMPARISON:  PET CT 10/27/2023 FINDINGS: Patient's known left sacral lesion is radiographically occult, also CT occult on prior PET. No evidence of acute fracture. Both hips are normally located. Pubic symphysis and sacroiliac joints are congruent. IMPRESSION: 1. Patient's known left sacral lesion is radiographically occult. 2. No evidence of pathologic fracture. Electronically Signed   By: Chadwick Colonel M.D.   On: 12/12/2023 22:06   DG Chest Portable 1 View Result Date: 12/12/2023 CLINICAL DATA:  History of lung cancer with Mets to bone. Left hip pain. EXAM: PORTABLE CHEST 1  VIEW COMPARISON:  Chest radiograph 10/01/2023. CT 09/17/2023, PET 10/27/2023 FINDINGS: Stable heart size and mediastinal contours. Aortic atherosclerosis. Chronic lung disease with subpleural reticulation, peripheral and basilar predominant. Nodular airspace disease in the right lung  apex is faintly visualized by radiograph. No evidence of focal bone lesion by radiograph. IMPRESSION: 1. No radiographic evidence of bone lesion. 2. Chronic lung disease. Nodule at the right lung apex is faintly visualized by radiograph. Electronically Signed   By: Chadwick Colonel M.D.   On: 12/12/2023 22:05   CT Lumbar Spine Wo Contrast Result Date: 12/06/2023 CLINICAL DATA:  Low back pain EXAM: CT LUMBAR SPINE WITHOUT CONTRAST TECHNIQUE: Multidetector CT imaging of the lumbar spine was performed without intravenous contrast administration. Multiplanar CT image reconstructions were also generated. RADIATION DOSE REDUCTION: This exam was performed according to the departmental dose-optimization program which includes automated exposure control, adjustment of the mA and/or kV according to patient size and/or use of iterative reconstruction technique. COMPARISON:  CT 09/17/2023 FINDINGS: Segmentation: Partial sacralization of the L5 vertebral body. Alignment: Normal Vertebrae: No acute fracture or focal pathologic process. Paraspinal and other soft tissues: Negative. 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 Disc levels: Maintained IMPRESSION: No acute bony abnormality. Electronically Signed   By: Janeece Mechanic M.D.   On: 12/06/2023 20:30   CT FEMUR LEFT WO CONTRAST Result Date: 12/06/2023 CLINICAL DATA:  Hip pain EXAM: CT OF THE LOWER LEFT EXTREMITY WITHOUT CONTRAST TECHNIQUE: Multidetector CT imaging of the lower left extremity was performed according to the standard protocol. RADIATION DOSE REDUCTION: This exam was performed according to the departmental dose-optimization program which includes automated exposure control, adjustment of the mA and/or kV according to patient size and/or use of iterative reconstruction technique. COMPARISON:  Plain films today. FINDINGS: Bones/Joint/Cartilage No acute bony abnormality. Specifically, no fracture, subluxation, or dislocation. No joint effusion. Ligaments Suboptimally  assessed by CT. Muscles and Tendons Negative Soft tissues Negative IMPRESSION: No acute bony abnormality. Electronically Signed   By: Janeece Mechanic M.D.   On: 12/06/2023 20:27   DG Hip Unilat With Pelvis 2-3 Views Left Result Date: 12/06/2023 CLINICAL DATA:  Hip pain EXAM: DG HIP (WITH OR WITHOUT PELVIS) 2-3V LEFT COMPARISON:  None Available. FINDINGS: There is no evidence of hip fracture or dislocation. There is no evidence of arthropathy or other focal bone abnormality. IMPRESSION: Negative. Electronically Signed   By: Tyron Gallon M.D.   On: 12/06/2023 18:39      Subjective: Patient seen and examined at bedside.  Poor historian.  Does not participate in conversation much.  Wants to go home today.  No fever, agitation reported.  Discharge Exam: Vitals:   12/26/23 2051 12/27/23 0444  BP: (!) 152/85 (!) 139/92  Pulse: 73 82  Resp: 14 14  Temp: 98.3 F (36.8 C) 98.4 F (36.9 C)  SpO2: 100% 100%    General: Pt is alert, awake, not in acute distress.  Slow to respond.  Poor historian.  On room air.  Flat affect. Cardiovascular: rate controlled, S1/S2 + Respiratory: bilateral decreased breath sounds at bases Abdominal: Soft, NT, ND, bowel sounds + Extremities: no edema, no cyanosis.  Right shoulder in sling.    The results of significant diagnostics from this hospitalization (including imaging, microbiology, ancillary and laboratory) are listed below for reference.     Microbiology: No results found for this or any previous visit (from the past 240 hours).   Labs: BNP (last 3 results) No results for input(s): "BNP" in the last 8760 hours. Basic  Metabolic Panel: Recent Labs  Lab 12/21/23 0538 12/23/23 0551  NA 131* 132*  K 4.5 4.1  CL 99 98  CO2 22 24  GLUCOSE 98 97  BUN 14 19  CREATININE 0.77 0.76  CALCIUM  9.4 9.3  MG 2.1 1.9  PHOS 3.4  --    Liver Function Tests: No results for input(s): "AST", "ALT", "ALKPHOS", "BILITOT", "PROT", "ALBUMIN" in the last 168  hours. No results for input(s): "LIPASE", "AMYLASE" in the last 168 hours. No results for input(s): "AMMONIA" in the last 168 hours. CBC: Recent Labs  Lab 12/21/23 0538 12/23/23 0551  WBC 16.5* 16.1*  NEUTROABS  --  13.0*  HGB 13.7 13.8  HCT 42.9 43.2  MCV 88.5 89.6  PLT 453* 567*   Cardiac Enzymes: No results for input(s): "CKTOTAL", "CKMB", "CKMBINDEX", "TROPONINI" in the last 168 hours. BNP: Invalid input(s): "POCBNP" CBG: No results for input(s): "GLUCAP" in the last 168 hours. D-Dimer No results for input(s): "DDIMER" in the last 72 hours. Hgb A1c No results for input(s): "HGBA1C" in the last 72 hours. Lipid Profile No results for input(s): "CHOL", "HDL", "LDLCALC", "TRIG", "CHOLHDL", "LDLDIRECT" in the last 72 hours. Thyroid  function studies No results for input(s): "TSH", "T4TOTAL", "T3FREE", "THYROIDAB" in the last 72 hours.  Invalid input(s): "FREET3" Anemia work up No results for input(s): "VITAMINB12", "FOLATE", "FERRITIN", "TIBC", "IRON", "RETICCTPCT" in the last 72 hours. Urinalysis    Component Value Date/Time   COLORURINE YELLOW 12/19/2023 0610   APPEARANCEUR HAZY (A) 12/19/2023 0610   LABSPEC 1.008 12/19/2023 0610   PHURINE 7.0 12/19/2023 0610   GLUCOSEU NEGATIVE 12/19/2023 0610   HGBUR NEGATIVE 12/19/2023 0610   BILIRUBINUR NEGATIVE 12/19/2023 0610   KETONESUR NEGATIVE 12/19/2023 0610   PROTEINUR NEGATIVE 12/19/2023 0610   NITRITE NEGATIVE 12/19/2023 0610   LEUKOCYTESUR SMALL (A) 12/19/2023 0610   Sepsis Labs Recent Labs  Lab 12/21/23 0538 12/23/23 0551  WBC 16.5* 16.1*   Microbiology No results found for this or any previous visit (from the past 240 hours).   Time coordinating discharge: 35 minutes  SIGNED:   Audria Leather, MD  [facility] 12/27/2023, 9:48 AM

## 2023-12-27 NOTE — Plan of Care (Signed)
  Problem: Health Behavior/Discharge Planning: Goal: Ability to manage health-related needs will improve Outcome: Progressing   Problem: Activity: Goal: Risk for activity intolerance will decrease Outcome: Progressing   Problem: Coping: Goal: Level of anxiety will decrease Outcome: Progressing   Problem: Pain Managment: Goal: General experience of comfort will improve and/or be controlled Outcome: Progressing

## 2023-12-28 ENCOUNTER — Other Ambulatory Visit (HOSPITAL_COMMUNITY): Payer: Self-pay

## 2023-12-28 ENCOUNTER — Other Ambulatory Visit: Payer: Self-pay

## 2023-12-28 ENCOUNTER — Telehealth: Payer: Self-pay | Admitting: *Deleted

## 2023-12-28 ENCOUNTER — Telehealth: Payer: Self-pay | Admitting: Internal Medicine

## 2023-12-28 NOTE — Transitions of Care (Post Inpatient/ED Visit) (Signed)
   12/28/2023  Name: Regina [name] MRN: [id number] DOB: 1966-01-07  Today's TOC FU Call Status: Today's TOC FU Call Status:: Unsuccessful Call (1st Attempt) Unsuccessful Call (1st Attempt) Date: 12/28/23  Attempted to reach the patient regarding the most recent Inpatient/ED visit.  Follow Up Plan: Additional outreach attempts will be made to reach the patient to complete the Transitions of Care (Post Inpatient/ED visit) call.   Arna Better RN, BSN [place]  [facility] RN Care Manager [phone]

## 2023-12-28 NOTE — Telephone Encounter (Signed)
"  We're sorry, your call cannot be completed please try again later" I was unable to reach Regina [name]. I will attempt to reach out through MyChart.

## 2023-12-29 ENCOUNTER — Telehealth: Payer: Self-pay | Admitting: Internal Medicine

## 2023-12-29 ENCOUNTER — Telehealth: Payer: Self-pay | Admitting: *Deleted

## 2023-12-29 NOTE — Transitions of Care (Post Inpatient/ED Visit) (Signed)
   12/29/2023  Name: Iyah Laguna MRN: [id number] DOB: 02/01/1966  Today's TOC FU Call Status: Today's TOC FU Call Status:: Unsuccessful Call (2nd Attempt) Unsuccessful Call (2nd Attempt) Date: 12/29/23  Attempted to reach the patient regarding the most recent Inpatient/ED visit.  Follow Up Plan: Additional outreach attempts will be made to reach the patient to complete the Transitions of Care (Post Inpatient/ED visit) call.   Arna Better RN, BSN [place]  [facility] RN Care Manager [phone]

## 2023-12-29 NOTE — Telephone Encounter (Signed)
 Attempted to call the patient per 4/23 staff message. The patient informed us  of a different phone number, [phone]. I attempted to contact the patient via updated number and the number is not an in service number.

## 2023-12-30 ENCOUNTER — Telehealth: Payer: Self-pay

## 2023-12-30 NOTE — Transitions of Care (Post Inpatient/ED Visit) (Signed)
   12/30/2023  Name: Regina [name] MRN: [id number] DOB: 25-Sep-1965  Today's TOC FU Call Status: Today's TOC FU Call Status:: Unsuccessful Call (3rd Attempt) Unsuccessful Call (3rd Attempt) Date: 12/30/23  Attempted to reach the patient regarding the most recent Inpatient/ED visit.  Follow Up Plan: No further outreach attempts will be made at this time. We have been unable to contact the patient.  Orpha Blade, RN, BSN, CEN [employer]- Transition of Care Team.  [facility] [phone]

## 2024-01-02 ENCOUNTER — Inpatient Hospital Stay: Payer: MEDICAID | Admitting: Nurse Practitioner

## 2024-01-03 ENCOUNTER — Other Ambulatory Visit: Payer: Self-pay

## 2024-01-03 ENCOUNTER — Other Ambulatory Visit (HOSPITAL_COMMUNITY): Payer: Self-pay

## 2024-01-04 ENCOUNTER — Other Ambulatory Visit (HOSPITAL_COMMUNITY): Payer: 59

## 2024-01-04 ENCOUNTER — Encounter: Payer: Self-pay | Admitting: Internal Medicine

## 2024-01-04 ENCOUNTER — Other Ambulatory Visit: Payer: Self-pay

## 2024-01-05 ENCOUNTER — Telehealth: Payer: Self-pay | Admitting: Medical Oncology

## 2024-01-05 ENCOUNTER — Other Ambulatory Visit: Payer: Self-pay

## 2024-01-05 ENCOUNTER — Inpatient Hospital Stay: Payer: MEDICAID | Attending: Internal Medicine | Admitting: Internal Medicine

## 2024-01-05 ENCOUNTER — Telehealth: Payer: Self-pay | Admitting: *Deleted

## 2024-01-05 ENCOUNTER — Other Ambulatory Visit (HOSPITAL_COMMUNITY): Payer: Self-pay

## 2024-01-05 ENCOUNTER — Telehealth: Payer: Self-pay | Admitting: Internal Medicine

## 2024-01-05 NOTE — Telephone Encounter (Signed)
 Willma's phone goes to an automated message "we're sorry your call cannot be completed." I called Clarise's niece [name], and she stated that the last time she spoke with Eilleen, it sounded like she dropped her phone and she has not been able to reach her since then. [name] also mentioned that she will call Isaac's son to see what is going on and she will have Tanda call us  to reschedule her appointment after she goes by to check on Kodee.

## 2024-01-05 NOTE — Telephone Encounter (Signed)
 PC to patient, both cell & home numbers, regarding missed appointment today.  Unable to leave message, recording states "call cannot be completed at this time."  Scheduling message sent to reschedule patient.

## 2024-01-05 NOTE — Telephone Encounter (Signed)
 Tessie answered her cell phone and I told her for May 12 .appts-lab ,[facility] and [employer]. . She confirmed her appts.

## 2024-01-06 ENCOUNTER — Encounter: Payer: Self-pay | Admitting: Internal Medicine

## 2024-01-06 ENCOUNTER — Other Ambulatory Visit (HOSPITAL_COMMUNITY): Payer: Self-pay

## 2024-01-06 ENCOUNTER — Other Ambulatory Visit: Payer: Self-pay

## 2024-01-09 ENCOUNTER — Encounter: Payer: Self-pay | Admitting: Internal Medicine

## 2024-01-11 ENCOUNTER — Ambulatory Visit: Payer: 59

## 2024-01-11 ENCOUNTER — Other Ambulatory Visit: Payer: Self-pay

## 2024-01-11 ENCOUNTER — Other Ambulatory Visit: Payer: 59

## 2024-01-11 ENCOUNTER — Ambulatory Visit: Payer: 59 | Admitting: Urology

## 2024-01-11 ENCOUNTER — Ambulatory Visit: Payer: 59 | Admitting: Internal Medicine

## 2024-01-11 NOTE — Progress Notes (Signed)
 Pt has no-showed multiple appts for palliative care and at this time the referral will be closed. Palliative services available upon re-consult.

## 2024-01-16 ENCOUNTER — Inpatient Hospital Stay: Payer: MEDICAID

## 2024-01-16 ENCOUNTER — Other Ambulatory Visit: Payer: Self-pay | Admitting: Internal Medicine

## 2024-01-16 ENCOUNTER — Other Ambulatory Visit (HOSPITAL_COMMUNITY): Payer: Self-pay

## 2024-01-16 ENCOUNTER — Other Ambulatory Visit: Payer: Self-pay

## 2024-01-16 ENCOUNTER — Inpatient Hospital Stay (HOSPITAL_BASED_OUTPATIENT_CLINIC_OR_DEPARTMENT_OTHER): Payer: MEDICAID | Admitting: Internal Medicine

## 2024-01-16 ENCOUNTER — Inpatient Hospital Stay: Payer: MEDICAID | Attending: Internal Medicine | Admitting: Internal Medicine

## 2024-01-16 ENCOUNTER — Other Ambulatory Visit: Payer: Self-pay | Admitting: Physician Assistant

## 2024-01-16 ENCOUNTER — Encounter: Payer: Self-pay | Admitting: *Deleted

## 2024-01-16 ENCOUNTER — Encounter: Payer: Self-pay | Admitting: Internal Medicine

## 2024-01-16 ENCOUNTER — Telehealth: Payer: Self-pay | Admitting: Medical Oncology

## 2024-01-16 VITALS — BP 156/79 | HR 94 | Temp 98.2°F | Resp 16 | Ht 66.0 in | Wt 114.1 lb

## 2024-01-16 VITALS — BP 155/77 | HR 94 | Temp 98.2°F | Resp 16 | Wt 114.0 lb

## 2024-01-16 DIAGNOSIS — C7931 Secondary malignant neoplasm of brain: Secondary | ICD-10-CM | POA: Diagnosis not present

## 2024-01-16 DIAGNOSIS — G893 Neoplasm related pain (acute) (chronic): Secondary | ICD-10-CM | POA: Insufficient documentation

## 2024-01-16 DIAGNOSIS — Z87891 Personal history of nicotine dependence: Secondary | ICD-10-CM | POA: Diagnosis not present

## 2024-01-16 DIAGNOSIS — C3411 Malignant neoplasm of upper lobe, right bronchus or lung: Secondary | ICD-10-CM | POA: Diagnosis not present

## 2024-01-16 DIAGNOSIS — C3490 Malignant neoplasm of unspecified part of unspecified bronchus or lung: Secondary | ICD-10-CM

## 2024-01-16 DIAGNOSIS — C7951 Secondary malignant neoplasm of bone: Secondary | ICD-10-CM | POA: Diagnosis not present

## 2024-01-16 LAB — CBC WITH DIFFERENTIAL (CANCER CENTER ONLY)
Abs Immature Granulocytes: 0.31 10*3/uL — ABNORMAL HIGH (ref 0.00–0.07)
Basophils Absolute: 0 10*3/uL (ref 0.0–0.1)
Basophils Relative: 0 %
Eosinophils Absolute: 0 10*3/uL (ref 0.0–0.5)
Eosinophils Relative: 0 %
HCT: 38.7 % (ref 36.0–46.0)
Hemoglobin: 13.7 g/dL (ref 12.0–15.0)
Immature Granulocytes: 3 %
Lymphocytes Relative: 8 %
Lymphs Abs: 0.9 10*3/uL (ref 0.7–4.0)
MCH: 28.9 pg (ref 26.0–34.0)
MCHC: 35.4 g/dL (ref 30.0–36.0)
MCV: 81.6 fL (ref 80.0–100.0)
Monocytes Absolute: 0.3 10*3/uL (ref 0.1–1.0)
Monocytes Relative: 3 %
Neutro Abs: 9.9 10*3/uL — ABNORMAL HIGH (ref 1.7–7.7)
Neutrophils Relative %: 86 %
Platelet Count: 422 10*3/uL — ABNORMAL HIGH (ref 150–400)
RBC: 4.74 MIL/uL (ref 3.87–5.11)
RDW: 17.2 % — ABNORMAL HIGH (ref 11.5–15.5)
WBC Count: 11.5 10*3/uL — ABNORMAL HIGH (ref 4.0–10.5)
nRBC: 0 % (ref 0.0–0.2)

## 2024-01-16 LAB — CMP (CANCER CENTER ONLY)
ALT: 20 U/L (ref 0–44)
AST: 21 U/L (ref 15–41)
Albumin: 4.1 g/dL (ref 3.5–5.0)
Alkaline Phosphatase: 81 U/L (ref 38–126)
Anion gap: 10 (ref 5–15)
BUN: 11 mg/dL (ref 6–20)
CO2: 28 mmol/L (ref 22–32)
Calcium: 9.5 mg/dL (ref 8.9–10.3)
Chloride: 96 mmol/L — ABNORMAL LOW (ref 98–111)
Creatinine: 0.61 mg/dL (ref 0.44–1.00)
GFR, Estimated: >60 mL/min (ref >60)
Glucose, Bld: 141 mg/dL — ABNORMAL HIGH (ref 70–99)
Potassium: 2.8 mmol/L — ABNORMAL LOW (ref 3.5–5.1)
Sodium: 134 mmol/L — ABNORMAL LOW (ref 135–145)
Total Bilirubin: 0.3 mg/dL (ref 0.0–1.2)
Total Protein: 7.1 g/dL (ref 6.5–8.1)

## 2024-01-16 MED ORDER — DEXAMETHASONE 1 MG PO TABS
ORAL_TABLET | ORAL | 0 refills | Status: DC
  Filled 2024-01-16: qty 42, 21d supply, fill #0

## 2024-01-16 MED ORDER — POTASSIUM CHLORIDE CRYS ER 20 MEQ PO TBCR
20.0000 meq | EXTENDED_RELEASE_TABLET | Freq: Two times a day (BID) | ORAL | 0 refills | Status: DC
  Filled 2024-01-16: qty 10, 5d supply, fill #0

## 2024-01-16 NOTE — Progress Notes (Signed)
 [facility] [address]  [place], [place] [address] [phone]   New Patient Evaluation  Date of Service: 01/16/24 Patient Name: Regina [name] Patient MRN: [id number] Patient DOB: March 21, 1966 Provider: Dorlene Footman K Sharad Vaneaton, MD  Identifying Statement:  Regina [name] is a 58 y.o. female with Lung cancer metastatic to brain [facility]) who presents for initial consultation and evaluation regarding cancer associated neurologic deficits.    Referring Provider: Marius Siemens, NP [address] [place],  [place] [address]  Primary Cancer:  Oncologic History: Oncology History  Primary adenocarcinoma of upper lobe of right lung (HCC)  09/22/2023 Initial Diagnosis   Primary adenocarcinoma of upper lobe of right lung (HCC)   09/22/2023 Cancer Staging   Staging form: Lung, AJCC V9 - Clinical stage from 09/22/2023: Stage IVB (cT1c, cN3, cM1c2) - Signed by Marlene Simas, MD on 09/22/2023 Stage prefix: Initial diagnosis Method of lymph node assessment: Clinical   01/23/2024 -  Chemotherapy   Patient is on Treatment Plan : LUNG NSCLC Pembrolizumab (200) q21d      CNS Oncologic History 10/06/23: Completes 24Gy/3 to left frontal metastasis with Dr. Lorri Rota 10/07/23: Planned resection aborted following perioperative purelent leakage  History of Present Illness: The patient's records from the referring physician were obtained and reviewed and the patient interviewed to confirm this HPI.  [name] presents for follow up, now having completed radiation for left frontal metastasis in late January.  She was hospitalized this past month for worsening confusion and cancer related pain from bone metastases.  She was started on decadron , discharged on 2mg  twice per day which she has been taking since 12/27/23.  Initially had presented with new onset seizure on 4/7.  No seizures since the initial hospitalization.  Still has quite a bit of pain in the right arm  which is limiting it functional use.  Medications: Current Outpatient Medications on File Prior to Visit  Medication Sig Dispense Refill   acetaminophen  (TYLENOL ) 500 MG tablet Take 2 tablets (1,000 mg total) by mouth every 6 (six) hours.     amLODipine  (NORVASC ) 5 MG tablet Take 1 tablet (5 mg total) by mouth daily. 30 tablet 0   diclofenac  Sodium (VOLTAREN ) 1 % GEL Apply 2 g topically 4 (four) times daily. 50 g 0   levETIRAcetam  (KEPPRA ) 500 MG tablet Take 1 tablet (500 mg total) by mouth 2 (two) times daily. 60 tablet 1   metoprolol  tartrate (LOPRESSOR ) 25 MG tablet Take 1 tablet (25 mg total) by mouth 2 (two) times daily. 60 tablet 1   oxyCODONE  (OXYCONTIN ) 15 mg 12 hr tablet Take 1 tablet (15 mg total) by mouth every 8 (eight) hours. 20 tablet 0   Oxycodone  HCl 10 MG TABS Take 1 tablet (10 mg total) by mouth every 6 (six) hours as needed for moderate pain (pain score 4-6). 20 tablet 0   pantoprazole  (PROTONIX ) 40 MG tablet Take 1 tablet (40 mg total) by mouth daily. 30 tablet 1   polyethylene glycol (MIRALAX  / GLYCOLAX ) 17 g packet Mix 1 packet (17 g) in 4-8 ounces of water and take by mouth daily. 14 each 0   potassium chloride  SA (KLOR-CON  M) 20 MEQ tablet Take 1 tablet (20 mEq total) by mouth 2 (two) times daily. 10 tablet 0   senna (SENOKOT) 8.6 MG TABS tablet Take 1 tablet (8.6 mg total) by mouth 2 (two) times daily. 30 tablet 0   No current facility-administered medications on file prior to visit.  Allergies:  Allergies  Allergen Reactions   Pork Allergy Nausea And Vomiting   Bee Venom Hives   Past Medical History:  Past Medical History:  Diagnosis Date   Allergy    Anxiety    Brain mass    COPD (chronic obstructive pulmonary disease) (HCC)    Emphysema of lung (HCC)    Hypertension    Past Surgical History:  Past Surgical History:  Procedure Laterality Date   APPLICATION OF CRANIAL NAVIGATION Left 10/07/2023   Procedure: APPLICATION OF CRANIAL NAVIGATION;   Surgeon: Audie Bleacher, MD;  Location: [facility] OR;  Service: Neurosurgery;  Laterality: Left;   CRANIOTOMY Left 10/07/2023   Procedure: FRONTAL CRANIOTOMY FOR ABSCESS;  Surgeon: Audie Bleacher, MD;  Location: [facility] OR;  Service: Neurosurgery;  Laterality: Left;   dental procedure     HUMERUS IM NAIL Right 10/10/2023   Procedure: INTRAMEDULLARY (IM) NAIL HUMERAL RIGHT;  Surgeon: Wilhelmenia Harada, MD;  Location: [facility] OR;  Service: Orthopedics;  Laterality: Right;   Social History:  Social History   Socioeconomic History   Marital status: Widowed    Spouse name: Not on file   Number of children: 1    Years of education: 23    Highest education level: Not on file  Occupational History   Occupation: Set designer: [employer]    Comment: Nurse, mental health   Tobacco Use   Smoking status: Former    Current packs/day: 0.00    Types: Cigarettes    Quit date: 10/31/2019    Years since quitting: 4.2   Smokeless tobacco: Never  Vaping Use   Vaping status: Never Used  Substance and Sexual Activity   Alcohol  use: Yes    Alcohol /week: 6.0 standard drinks of alcohol     Types: 6 Cans of beer per week   Drug use: Yes    Frequency: 4.0 times per week    Types: Marijuana   Sexual activity: Not Currently  Other Topics Concern   Not on file  Social History Narrative   Lives with her brother.    Son lives near by, 41 yo, married.    Social Drivers of Corporate investment banker Strain: Not on file  Food Insecurity: No Food Insecurity (12/19/2023)   Hunger Vital Sign    Worried About Running Out of Food in the Last Year: Never true    Ran Out of Food in the Last Year: Never true  Transportation Needs: No Transportation Needs (12/19/2023)   PRAPARE - Administrator, Civil Service (Medical): No    Lack of Transportation (Non-Medical): No  Physical Activity: Not on file  Stress: Not on file  Social Connections: Not on file  Intimate Partner Violence: Not At Risk  (12/19/2023)   Humiliation, Afraid, Rape, and Kick questionnaire    Fear of Current or Ex-Partner: No    Emotionally Abused: No    Physically Abused: No    Sexually Abused: No   Family History:  Family History  Problem Relation Age of Onset   Leukemia Mother    Hypertension Mother    Hypertension Father    Cancer Neg Hx    Diabetes Neg Hx    Heart disease Neg Hx    Colon cancer Neg Hx    Esophageal cancer Neg Hx    Rectal cancer Neg Hx    Stomach cancer Neg Hx     Review of Systems: Constitutional: Doesn't report fevers, chills or abnormal weight loss  Eyes: Doesn't report blurriness of vision Ears, nose, mouth, throat, and face: Doesn't report sore throat Respiratory: Doesn't report cough, dyspnea or wheezes Cardiovascular: Doesn't report palpitation, chest discomfort  Gastrointestinal:  Doesn't report nausea, constipation, diarrhea GU: Doesn't report incontinence Skin: Doesn't report skin rashes Neurological: Per HPI Musculoskeletal: Doesn't report joint pain Behavioral/Psych: Doesn't report anxiety  Physical Exam: Vitals:   01/16/24 1412  BP: (!) 155/77  Pulse: 94  Resp: 16  Temp: 98.2 F (36.8 C)  SpO2: 100%   KPS: 80. General: Alert, cooperative, pleasant, in no acute distress Head: Normal EENT: No conjunctival injection or scleral icterus.  Lungs: Resp effort normal Cardiac: Regular rate Abdomen: Non-distended abdomen Skin: No rashes cyanosis or petechiae. Extremities: No clubbing or edema  Neurologic Exam: Mental Status: Awake, alert, attentive to examiner. Oriented to self and environment. Language is fluent with intact comprehension, mild expressive dysphasia noted.  Cranial Nerves: Visual acuity is grossly normal. Visual fields are full. Extra-ocular movements intact. No ptosis. Face is symmetric Motor: Tone and bulk are normal. Power is full in both arms and legs, limited by pain in right arm. Reflexes are symmetric, no pathologic reflexes present.   Sensory: Intact to light touch Gait: Normal.   Labs: I have reviewed the data as listed    Component Value Date/Time   NA 134 (L) 01/16/2024 1007   NA 130 (L) 10/19/2017 0942   K 2.8 (L) 01/16/2024 1007   CL 96 (L) 01/16/2024 1007   CO2 28 01/16/2024 1007   GLUCOSE 141 (H) 01/16/2024 1007   BUN 11 01/16/2024 1007   BUN 4 (L) 10/19/2017 0942   CREATININE 0.61 01/16/2024 1007   CALCIUM  9.5 01/16/2024 1007   PROT 7.1 01/16/2024 1007   PROT 7.6 10/19/2017 0942   ALBUMIN 4.1 01/16/2024 1007   ALBUMIN 4.6 10/19/2017 0942   AST 21 01/16/2024 1007   ALT 20 01/16/2024 1007   ALKPHOS 81 01/16/2024 1007   BILITOT 0.3 01/16/2024 1007   GFRNONAA >60 01/16/2024 1007   GFRAA >60 01/30/2020 0814   Lab Results  Component Value Date   WBC 11.5 (H) 01/16/2024   NEUTROABS 9.9 (H) 01/16/2024   HGB 13.7 01/16/2024   HCT 38.7 01/16/2024   MCV 81.6 01/16/2024   PLT 422 (H) 01/16/2024    Imaging: CLINICAL DATA:  Metastatic disease evaluation. Stage IV non-small cell lung cancer treated with radiation in January.   EXAM: MRI HEAD WITHOUT AND WITH CONTRAST   TECHNIQUE: Multiplanar, multiecho pulse sequences of the brain and surrounding structures were obtained without and with intravenous contrast.   CONTRAST:  5.49mL GADAVIST  GADOBUTROL  1 MMOL/ML IV SOLN   COMPARISON:  CT 2 days ago.  MRI 10/01/2023   FINDINGS: Brain: Diffusion imaging does not show any acute or subacute infarction or other cause of restricted diffusion. Interval left frontal craniotomy for mass resection. No focal abnormality seen affecting the brainstem or cerebellum. Right cerebral hemisphere shows chronic small-vessel ischemic changes of the white matter as seen previously. On the left, there is been previous frontal craniotomy for partial resection of the previously seen mass lesion. Previous measurements were 3.2 x 3.4 x 2.5 cm. Today, the lesion measures 2.0 x 1.7 x 1.4 cm. Small resection cavity is  present superior to the enhancing mass lesion. No second lesion is identified. The amount of regional vasogenic edema appears slightly reduced, with the slight decrease in mass effect and reduction in left to right shift from 1 cm to 4 mm. No hydrocephalus.  No extra-axial collection.   Vascular: Major vessels at the base of the brain show flow.   Skull and upper cervical spine: Otherwise negative   Sinuses/Orbits: Clear other than mild mucosal thickening of the left maxillary sinus. Orbits negative.   Other: None   IMPRESSION: Interval left frontal craniotomy for partial resection of the previously seen mass lesion. Previous measurements were 3.2 x 3.4 x 2.5 cm. Today, the lesion measures 2.0 x 1.7 x 1.4 cm. Small resection cavity is present superior to the enhancing mass lesion. The amount of regional vasogenic edema appears slightly reduced, with the slight decrease in mass effect and reduction in left to right shift from 1 cm to 4 mm. No second lesion is identified.     Electronically Signed   By: Bettylou Brunner M.D.   On: 12/14/2023 15:06   Assessment/Plan Lung cancer metastatic to brain (HCC)  Higinio Love is clinically stable today, now having completed radiation for left frontal metastasis.  Craniotomy was not completed as planned due to concern for abscess intra-operatively.  Seizures are well controlled on Keppra  500mg  BID.    Recommended decreasing decadron  by 1mg  each week, starting with 3mg  daily tomorrow.  Dose may be modified if focal symptoms recur.  She will con't to follow up with Dr. Marguerita Shih for planned resumption of Keytruda.  We spent twenty additional minutes teaching regarding the natural history, biology, and historical experience in the treatment of neurologic complications of cancer.   We appreciate the opportunity to participate in the care of Kinnedy Gaines.   We ask that Higinio Love return to clinic in 2 months following next brain MRI, or  sooner as needed.  All questions were answered. The patient knows to call the clinic with any problems, questions or concerns. No barriers to learning were detected.  The total time spent in the encounter was 40 minutes and more than 50% was on counseling and review of test results   Mamie Searles, MD Medical Director of Neuro-Oncology [facility] [place] 01/16/24 2:14 PM

## 2024-01-16 NOTE — Progress Notes (Signed)
 [facility] [phone]   [phone]  OFFICE PROGRESS NOTE  Marius Siemens, NP [address] [place] [address]  DIAGNOSIS: Stage IV (T1c, N3, M1 C) non-small cell lung cancer, adenocarcinoma presented with right upper lobe lung mass in addition to extensive bilateral hilar, mediastinal as well as right supraclavicular lymphadenopathy and metastatic disease to the bone in the right arm as well as brain metastasis diagnosed in January 2025.    Molecular Studies: No actionable mutations   TMB: 14 mut/mb   PDL1" 90%   PRIOR THERAPY:  1) Craniotomy and SRS under the care of Dr. Michale Age and Dr. Lorri Rota on 10/06/23 and 10/07/23 2) Right humeral nailing by Dr. Hermina Loosen due to pathologic fracture on 10/10/23  CURRENT THERAPY: Pembrolizumab 200 Mg IV every 3 weeks.  First dose Jan 23 2024  INTERVAL HISTORY: Chaeli Breit 58 y.o. female returns to the clinic today for follow-up visit.Discussed the use of AI scribe software for clinical note transcription with the patient, who gave verbal consent to proceed.  History of Present Illness   Deondra Swaner is a 58 year old female with stage four non-small cell lung cancer who presents for evaluation and discussion of systemic treatment options.  Diagnosed with stage four non-small cell lung cancer, adenocarcinoma, in January 2025, with metastases to the brain, bones, and lymph nodes. Metastatic sites include a brain lesion, extensive bilateral hilar and mediastinal involvement, a tumor in the right upper lobe of the lung, right supraclavicular lymphadenopathy, and bone metastases.  Underwent a craniotomy with resection of the brain tumor followed by stereotactic radiosurgery to the brain lesion. Additionally, had right humeral nailing for a pathologic fracture in the right arm, followed by palliative radiation to that area. Experiences ongoing pain in the right arm, managed with Tylenol , though finds  it challenging to stay off the medication due to persistent pain.  Reports pain in the right arm. No nausea, vomiting, diarrhea, or headaches. Describes feeling 'laggy'. Aware of high blood pressure, which she attributes to the pain.         MEDICAL HISTORY: Past Medical History:  Diagnosis Date   Allergy    Anxiety    Brain mass    COPD (chronic obstructive pulmonary disease) (HCC)    Emphysema of lung (HCC)    Hypertension     ALLERGIES:  is allergic to pork allergy and bee venom.  MEDICATIONS:  Current Outpatient Medications  Medication Sig Dispense Refill   acetaminophen  (TYLENOL ) 500 MG tablet Take 2 tablets (1,000 mg total) by mouth every 6 (six) hours.     amLODipine  (NORVASC ) 5 MG tablet Take 1 tablet (5 mg total) by mouth daily. 30 tablet 0   diclofenac  Sodium (VOLTAREN ) 1 % GEL Apply 2 g topically 4 (four) times daily. 50 g 0   levETIRAcetam  (KEPPRA ) 500 MG tablet Take 1 tablet (500 mg total) by mouth 2 (two) times daily. 60 tablet 1   metoprolol  tartrate (LOPRESSOR ) 25 MG tablet Take 1 tablet (25 mg total) by mouth 2 (two) times daily. 60 tablet 1   oxyCODONE  (OXYCONTIN ) 15 mg 12 hr tablet Take 1 tablet (15 mg total) by mouth every 8 (eight) hours. 20 tablet 0   Oxycodone  HCl 10 MG TABS Take 1 tablet (10 mg total) by mouth every 6 (six) hours as needed for moderate pain (pain score 4-6). 20 tablet 0   pantoprazole  (PROTONIX ) 40 MG tablet Take 1 tablet (40 mg total) by  mouth daily. 30 tablet 1   polyethylene glycol (MIRALAX  / GLYCOLAX ) 17 g packet Mix 1 packet (17 g) in 4-8 ounces of water and take by mouth daily. 14 each 0   senna (SENOKOT) 8.6 MG TABS tablet Take 1 tablet (8.6 mg total) by mouth 2 (two) times daily. 30 tablet 0   No current facility-administered medications for this visit.    SURGICAL HISTORY:  Past Surgical History:  Procedure Laterality Date   APPLICATION OF CRANIAL NAVIGATION Left 10/07/2023   Procedure: APPLICATION OF CRANIAL NAVIGATION;   Surgeon: Audie Bleacher, MD;  Location: [facility] OR;  Service: Neurosurgery;  Laterality: Left;   CRANIOTOMY Left 10/07/2023   Procedure: FRONTAL CRANIOTOMY FOR ABSCESS;  Surgeon: Audie Bleacher, MD;  Location: [facility] OR;  Service: Neurosurgery;  Laterality: Left;   dental procedure     HUMERUS IM NAIL Right 10/10/2023   Procedure: INTRAMEDULLARY (IM) NAIL HUMERAL RIGHT;  Surgeon: Wilhelmenia Harada, MD;  Location: [facility] OR;  Service: Orthopedics;  Laterality: Right;    REVIEW OF SYSTEMS:  Constitutional: positive for fatigue Eyes: negative Ears, nose, mouth, throat, and face: negative Respiratory: negative Cardiovascular: negative Gastrointestinal: negative Genitourinary:negative Integument/breast: negative Hematologic/lymphatic: negative Musculoskeletal:positive for bone pain Neurological: negative Behavioral/Psych: negative Endocrine: negative Allergic/Immunologic: negative   PHYSICAL EXAMINATION: General appearance: alert, cooperative, fatigued, and no distress Head: Normocephalic, without obvious abnormality, atraumatic Neck: no adenopathy, no JVD, supple, symmetrical, trachea midline, and thyroid  not enlarged, symmetric, no tenderness/mass/nodules Lymph nodes: Cervical, supraclavicular, and axillary nodes normal. Resp: clear to auscultation bilaterally Back: symmetric, no curvature. ROM normal. No CVA tenderness. Cardio: regular rate and rhythm, S1, S2 normal, no murmur, click, rub or gallop GI: soft, non-tender; bowel sounds normal; no masses,  no organomegaly Extremities: extremities normal, atraumatic, no cyanosis or edema Neurologic: Alert and oriented X 3, normal strength and tone. Normal symmetric reflexes. Normal coordination and gait  ECOG PERFORMANCE STATUS: 1 - Symptomatic but completely ambulatory  Blood pressure (!) 156/79, pulse 94, temperature 98.2 F (36.8 C), temperature source Temporal, resp. rate 16, height 5\' 6"  (1.676 m), weight 114 lb 1.6 oz (51.8 kg), last menstrual  period 06/06/2010, SpO2 100%.  LABORATORY DATA: Lab Results  Component Value Date   WBC 11.5 (H) 01/16/2024   HGB 13.7 01/16/2024   HCT 38.7 01/16/2024   MCV 81.6 01/16/2024   PLT 422 (H) 01/16/2024      Chemistry      Component Value Date/Time   NA 132 (L) 12/23/2023 0551   NA 130 (L) 10/19/2017 0942   K 4.1 12/23/2023 0551   CL 98 12/23/2023 0551   CO2 24 12/23/2023 0551   BUN 19 12/23/2023 0551   BUN 4 (L) 10/19/2017 0942   CREATININE 0.76 12/23/2023 0551   CREATININE 0.51 11/02/2023 0845      Component Value Date/Time   CALCIUM  9.3 12/23/2023 0551   ALKPHOS 59 12/18/2023 1745   AST 26 12/18/2023 1745   AST 16 11/02/2023 0845   ALT 17 12/18/2023 1745   ALT 13 11/02/2023 0845   BILITOT 0.4 12/18/2023 1745   BILITOT 0.3 11/02/2023 0845       RADIOGRAPHIC STUDIES: DG Humerus Right Result Date: 12/19/2023 CLINICAL DATA:  Metastatic lung cancer. Pathologic fracture with pain EXAM: RIGHT HUMERUS - 2+ VIEW COMPARISON:  11/21/2023 FINDINGS: Large lytic lesion in the humeral shaft with spanning intramedullary nail. The distal interlocking screw has backed out, eccentric tip position within the lower humeral shaft is unchanged although there is some new periosteal reaction  around the screw. No new fracture when accounting for overlapping external artifact. Generalized osteopenia with permeative appearance throughout the humeral shaft, markedly progressed from 10/01/2023. IMPRESSION: Extensive destructive metastasis in the right humeral shaft with prominent progression since 10/01/2023. The lower interlocking screw of the medullary nail has backed out since 11/21/2023 with regional periosteal reaction likely from motion, tip being eccentric within the lower humeral shaft. Electronically Signed   By: Ronnette Coke M.D.   On: 12/19/2023 04:36   DG HIP UNILAT WITH PELVIS 2-3 VIEWS LEFT Result Date: 12/19/2023 CLINICAL DATA:  Metastatic cancer.  Leg pain. EXAM: DG HIP (WITH OR  WITHOUT PELVIS) 3V LEFT COMPARISON:  12/06/2023 FINDINGS: There is no evidence of hip fracture or dislocation. There is no evidence of arthropathy or other focal bone abnormality. Calcified fibroid in the pelvis which is small. IMPRESSION: No acute finding or explanation for hip pain. Electronically Signed   By: Ronnette Coke M.D.   On: 12/19/2023 04:32    ASSESSMENT AND PLAN: This is a very pleasant 58 years old African-American female with Stage IV (T1c, N3, M1 C) non-small cell lung cancer, adenocarcinoma presented with right upper lobe lung mass in addition to extensive bilateral hilar, mediastinal as well as right supraclavicular lymphadenopathy and metastatic disease to the bone in the right arm as well as brain metastasis diagnosed in January 2025.  Molecular studies showed no actionable mutation and she had PD-L1 expression of 90%. The patient underwent nailing of the right humeral lesion.    Stage 4 non-small cell lung cancer with metastases Stage 4 non-small cell lung cancer, adenocarcinoma subtype, diagnosed in January 2025, with metastases to brain, bilateral hilar and mediastinal regions, right supraclavicular lymph nodes, and bones. Post-craniotomy with resection of brain tumor and SRS to brain lesion. High PD-L1 expression suggests potential benefit from pembrolizumab (Keytruda) monotherapy, avoiding initial chemotherapy. Pembrolizumab is generally well-tolerated, with side effects including rash, diarrhea, and potential organ inflammation. Emphasized the importance of initiating treatment to prevent further cancer progression. - Initiate pembrolizumab (Keytruda) infusion next week, every three weeks. - Schedule follow-up appointment in four weeks for second round of treatment.  Pathological fracture of right humerus Pathological fracture of the right humerus secondary to metastatic bone disease. Post-surgical intervention with right humeral nailing. She reports ongoing pain in the  right arm, not yet receiving planned palliative radiation. Coordination with radiation oncology for treatment is necessary. - Coordinate with radiation oncology for palliative radiation to the right arm. - Consult palliative care team for pain management.  Hypertension Hypertension potentially exacerbated by pain from the pathological fracture. Advised to adhere to antihypertensive medication regimen and monitor blood pressure at home. - Continue antihypertensive medication. - Monitor blood pressure closely at home.  For the hypokalemia, I will order potassium chloride  20 meq p.o. daily for 10 days.  The patient was advised to call immediately if she has any concerning symptoms in the interval. The patient voices understanding of current disease status and treatment options and is in agreement with the current care plan.  All questions were answered. The patient knows to call the clinic with any problems, questions or concerns. We can certainly see the patient much sooner if necessary.  The total time spent in the appointment was 30 minutes.  Disclaimer: This note was dictated with voice recognition software. Similar sounding words can inadvertently be transcribed and may not be corrected upon review.

## 2024-01-16 NOTE — Telephone Encounter (Signed)
 Pt notified that potassium is low and Dr. Marguerita Shih sent in a prescription for potassium.

## 2024-01-17 ENCOUNTER — Other Ambulatory Visit (HOSPITAL_COMMUNITY): Payer: Self-pay

## 2024-01-17 ENCOUNTER — Telehealth: Payer: Self-pay | Admitting: Internal Medicine

## 2024-01-17 ENCOUNTER — Other Ambulatory Visit: Payer: Self-pay

## 2024-01-17 ENCOUNTER — Other Ambulatory Visit (INDEPENDENT_AMBULATORY_CARE_PROVIDER_SITE_OTHER): Payer: Self-pay | Admitting: Primary Care

## 2024-01-17 NOTE — Telephone Encounter (Signed)
 Patient scheduled appointments. Patient is aware of all appointment details.

## 2024-01-17 NOTE — Progress Notes (Signed)
 I was messaged by scheduler that the pt's education appt had to be moved from 5/16 to 5/15 at 9am, however was unable to reach the pt at the numbers listed. I called the listed mobile and home numbers, both times receiving "number cannot be completed as dialed" I called the pt's niece, [name], and let her know that we have been trying to reach the pt to let her know about the schedule change, but her listed numbers aren't working. [name] took note of the new date and time of the appt. I asked raycia to let the pt know we've been trying to reach her.

## 2024-01-18 ENCOUNTER — Other Ambulatory Visit (HOSPITAL_COMMUNITY): Payer: Self-pay

## 2024-01-18 ENCOUNTER — Other Ambulatory Visit: Payer: Self-pay

## 2024-01-18 ENCOUNTER — Ambulatory Visit: Payer: 59 | Admitting: Urology

## 2024-01-18 NOTE — Telephone Encounter (Signed)
 Requested medication (s) are due for refill today -provider review   Requested medication (s) are on the active medication list -yes  Future visit scheduled -yes  Last refill: 12/27/23 #20  Notes to clinic: non delegated Rx  Requested Prescriptions  Pending Prescriptions Disp Refills   Oxycodone  HCl 10 MG TABS 20 tablet 0    Sig: Take 1 tablet (10 mg total) by mouth every 6 (six) hours as needed for moderate pain (pain score 4-6).     Not Delegated - Analgesics:  Opioid Agonists Failed - 01/18/2024  9:24 AM      Failed - This refill cannot be delegated      Passed - Urine Drug Screen completed in last 360 days      Passed - Valid encounter within last 3 months    Recent Outpatient Visits           2 months ago Essential hypertension   [place] [facility] Marius Siemens, NP   7 months ago Influenza vaccination declined   [place] [facility] Marius Siemens, NP   1 year ago Hospital discharge follow-up   [place] [facility] Marius Siemens, NP   3 years ago Essential hypertension   [place] [facility] Marius Siemens, NP   4 years ago Tobacco abuse   [place] [facility] Marius Siemens, NP       Future Appointments             In 4 weeks Denece Finger, Meade Spencer, NP [place] [facility]               Requested Prescriptions  Pending Prescriptions Disp Refills   Oxycodone  HCl 10 MG TABS 20 tablet 0    Sig: Take 1 tablet (10 mg total) by mouth every 6 (six) hours as needed for moderate pain (pain score 4-6).     Not Delegated - Analgesics:  Opioid Agonists Failed - 01/18/2024  9:24 AM      Failed - This refill cannot be delegated      Passed - Urine Drug Screen completed in last 360 days      Passed - Valid encounter within last 3 months    Recent Outpatient Visits           2 months ago Essential hypertension   [place]  [facility] Marius Siemens, NP   7 months ago Influenza vaccination declined   [place] [facility] Marius Siemens, NP   1 year ago Hospital discharge follow-up   [place] [facility] Marius Siemens, NP   3 years ago Essential hypertension   [place] [facility] Marius Siemens, NP   4 years ago Tobacco abuse   [place] [facility] Marius Siemens, NP       Future Appointments             In 4 weeks Denece Finger, Meade Spencer, NP [place] [facility]

## 2024-01-19 ENCOUNTER — Inpatient Hospital Stay: Payer: MEDICAID

## 2024-01-19 ENCOUNTER — Other Ambulatory Visit (HOSPITAL_COMMUNITY): Payer: Self-pay

## 2024-01-19 ENCOUNTER — Other Ambulatory Visit: Payer: Self-pay

## 2024-01-20 ENCOUNTER — Other Ambulatory Visit (INDEPENDENT_AMBULATORY_CARE_PROVIDER_SITE_OTHER): Payer: Self-pay | Admitting: Primary Care

## 2024-01-20 ENCOUNTER — Other Ambulatory Visit: Payer: Self-pay

## 2024-01-20 ENCOUNTER — Other Ambulatory Visit (HOSPITAL_COMMUNITY): Payer: Self-pay

## 2024-01-20 ENCOUNTER — Other Ambulatory Visit: Payer: MEDICAID

## 2024-01-20 NOTE — Telephone Encounter (Signed)
 Please decline med refill

## 2024-01-21 ENCOUNTER — Other Ambulatory Visit: Payer: Self-pay | Admitting: Internal Medicine

## 2024-01-21 ENCOUNTER — Other Ambulatory Visit: Payer: Self-pay

## 2024-01-21 DIAGNOSIS — C3411 Malignant neoplasm of upper lobe, right bronchus or lung: Secondary | ICD-10-CM

## 2024-01-23 ENCOUNTER — Other Ambulatory Visit (HOSPITAL_COMMUNITY): Payer: Self-pay

## 2024-01-23 ENCOUNTER — Other Ambulatory Visit: Payer: Self-pay

## 2024-01-23 ENCOUNTER — Ambulatory Visit: Payer: MEDICAID

## 2024-01-23 ENCOUNTER — Other Ambulatory Visit (HOSPITAL_BASED_OUTPATIENT_CLINIC_OR_DEPARTMENT_OTHER): Payer: Self-pay

## 2024-01-23 ENCOUNTER — Other Ambulatory Visit (INDEPENDENT_AMBULATORY_CARE_PROVIDER_SITE_OTHER): Payer: Self-pay | Admitting: Primary Care

## 2024-01-23 ENCOUNTER — Other Ambulatory Visit: Payer: MEDICAID

## 2024-01-23 NOTE — Telephone Encounter (Signed)
 Requested medications are due for refill today.  yes  Requested medications are on the active medications list.  yes  Last refill. 12/27/2023 #20 0 rf  Future visit scheduled.   no  Notes to clinic.  Refill not delegated.    Requested Prescriptions  Pending Prescriptions Disp Refills   Oxycodone  HCl 10 MG TABS 20 tablet 0    Sig: Take 1 tablet (10 mg total) by mouth every 6 (six) hours as needed for moderate pain (pain score 4-6).     Not Delegated - Analgesics:  Opioid Agonists Failed - 01/23/2024  3:43 PM      Failed - This refill cannot be delegated      Passed - Urine Drug Screen completed in last 360 days      Passed - Valid encounter within last 3 months    Recent Outpatient Visits           2 months ago Essential hypertension   [place] [facility] Marius Siemens, NP   7 months ago Influenza vaccination declined   [place] [facility] Marius Siemens, NP   1 year ago Hospital discharge follow-up   [place] [facility] Marius Siemens, NP   3 years ago Essential hypertension   [place] [facility] Marius Siemens, NP   4 years ago Tobacco abuse   [place] [facility] Marius Siemens, NP       Future Appointments             In 3 weeks Denece Finger, Meade Spencer, NP [place] [facility]

## 2024-01-24 NOTE — Progress Notes (Signed)
 I reached out to pt regarding her missed infusion appt. No answer. LVM with my number requesting a return call.

## 2024-01-25 ENCOUNTER — Other Ambulatory Visit: Payer: Self-pay

## 2024-01-25 ENCOUNTER — Encounter: Payer: Self-pay | Admitting: Internal Medicine

## 2024-01-25 NOTE — Progress Notes (Signed)
 Patient left voicemail after receiving my card per my request on 01/19/24.  Returned call to patient introducing myself as Dance movement psychotherapist. Advised RN attempted to reach her and her phone was not working, therefore left a message with contact. Patient advised no one let her know her appointment was changed. Advised I would send message to RN to advise she can now reach patient at number listed as [phone] and she can contact me once she has been rescheduled to discuss further resources. She said ok.  Staff message sent to [facility].  Patient has my card to contact me and for any additional financial questions or concerns.

## 2024-01-27 ENCOUNTER — Other Ambulatory Visit: Payer: Self-pay

## 2024-01-27 ENCOUNTER — Other Ambulatory Visit (HOSPITAL_COMMUNITY): Payer: Self-pay

## 2024-01-27 ENCOUNTER — Emergency Department (HOSPITAL_COMMUNITY): Payer: MEDICAID

## 2024-01-27 ENCOUNTER — Emergency Department (HOSPITAL_COMMUNITY)
Admission: EM | Admit: 2024-01-27 | Discharge: 2024-01-28 | Disposition: A | Discharge status: Home / Self Care | Payer: MEDICAID | Attending: Emergency Medicine | Admitting: Emergency Medicine

## 2024-01-27 ENCOUNTER — Encounter (HOSPITAL_COMMUNITY): Payer: Self-pay | Admitting: Emergency Medicine

## 2024-01-27 DIAGNOSIS — R0602 Shortness of breath: Secondary | ICD-10-CM | POA: Diagnosis not present

## 2024-01-27 DIAGNOSIS — I10 Essential (primary) hypertension: Secondary | ICD-10-CM | POA: Diagnosis not present

## 2024-01-27 DIAGNOSIS — Z79899 Other long term (current) drug therapy: Secondary | ICD-10-CM | POA: Diagnosis not present

## 2024-01-27 DIAGNOSIS — J449 Chronic obstructive pulmonary disease, unspecified: Secondary | ICD-10-CM | POA: Insufficient documentation

## 2024-01-27 DIAGNOSIS — R0789 Other chest pain: Secondary | ICD-10-CM | POA: Insufficient documentation

## 2024-01-27 DIAGNOSIS — M79601 Pain in right arm: Secondary | ICD-10-CM | POA: Diagnosis not present

## 2024-01-27 DIAGNOSIS — Z85118 Personal history of other malignant neoplasm of bronchus and lung: Secondary | ICD-10-CM | POA: Diagnosis not present

## 2024-01-27 HISTORY — DX: Malignant (primary) neoplasm, unspecified: C80.1

## 2024-01-27 LAB — CBC
HCT: 39.9 % (ref 36.0–46.0)
Hemoglobin: 13.4 g/dL (ref 12.0–15.0)
MCH: 28.7 pg (ref 26.0–34.0)
MCHC: 33.6 g/dL (ref 30.0–36.0)
MCV: 85.4 fL (ref 80.0–100.0)
Platelets: 513 10*3/uL — ABNORMAL HIGH (ref 150–400)
RBC: 4.67 MIL/uL (ref 3.87–5.11)
RDW: 17.9 % — ABNORMAL HIGH (ref 11.5–15.5)
WBC: 15 10*3/uL — ABNORMAL HIGH (ref 4.0–10.5)
nRBC: 0 % (ref 0.0–0.2)

## 2024-01-27 LAB — BASIC METABOLIC PANEL WITH GFR
Anion gap: 10 (ref 5–15)
BUN: 15 mg/dL (ref 6–20)
CO2: 26 mmol/L (ref 22–32)
Calcium: 9.1 mg/dL (ref 8.9–10.3)
Chloride: 97 mmol/L — ABNORMAL LOW (ref 98–111)
Creatinine, Ser: 0.68 mg/dL (ref 0.44–1.00)
GFR, Estimated: >60 mL/min (ref >60)
Glucose, Bld: 88 mg/dL (ref 70–99)
Potassium: 3.6 mmol/L (ref 3.5–5.1)
Sodium: 133 mmol/L — ABNORMAL LOW (ref 135–145)

## 2024-01-27 LAB — TROPONIN I (HIGH SENSITIVITY): Troponin I (High Sensitivity): 35 ng/L — ABNORMAL HIGH (ref <18)

## 2024-01-27 LAB — BRAIN NATRIURETIC PEPTIDE: B Natriuretic Peptide: 103.3 pg/mL — ABNORMAL HIGH (ref 0.0–100.0)

## 2024-01-27 NOTE — ED Triage Notes (Signed)
 Patient reports central chest pain with SOB this afternoon , no fever or chills , denies emesis or diaphoresis .

## 2024-01-28 ENCOUNTER — Emergency Department (HOSPITAL_COMMUNITY): Payer: MEDICAID

## 2024-01-28 LAB — TROPONIN I (HIGH SENSITIVITY): Troponin I (High Sensitivity): 31 ng/L — ABNORMAL HIGH (ref <18)

## 2024-01-28 MED ORDER — OXYCODONE-ACETAMINOPHEN 5-325 MG PO TABS
1.0000 | ORAL_TABLET | Freq: Once | ORAL | Status: AC
  Administered 2024-01-28: 1 via ORAL
  Filled 2024-01-28: qty 1

## 2024-01-28 MED ORDER — IOHEXOL 350 MG/ML SOLN
75.0000 mL | Freq: Once | INTRAVENOUS | Status: AC | PRN
  Administered 2024-01-28: 75 mL via INTRAVENOUS

## 2024-01-28 NOTE — ED Notes (Signed)
 Patient transported to CT

## 2024-01-28 NOTE — Discharge Instructions (Signed)
 You were seen today for chest and arm pain.  Your workup today is reassuring.  Follow-up with your primary doctor and oncologist.  Your arm pain is likely related to prior pathologic fracture and surgery.

## 2024-01-28 NOTE — ED Provider Notes (Signed)
 [place] EMERGENCY DEPARTMENT AT [facility] Provider Note   CSN: [id number] Arrival date & time: 01/27/24  2221     History  Chief Complaint  Patient presents with   Chest Pain    Regina [name] is a 58 y.o. female.  HPI     This is a 58 year old female with a history of stage IV lung cancer undergoing treatment who presents with chest pain and shortness of breath.  She states she has had ongoing symptoms for the last 3 to 4 hours.  She describes it as both sharp and pressure.  She also reports pain in the right arm.  Prior injury to the right arm.  She has not had any recent fevers or cough.  No known history of blood clots and is not on anticoagulation.  Home Medications Prior to Admission medications   Medication Sig Start Date End Date Taking? Authorizing Provider  acetaminophen  (TYLENOL ) 500 MG tablet Take 2 tablets (1,000 mg total) by mouth every 6 (six) hours. 12/27/23  Yes Audria Leather, MD  amLODipine  (NORVASC ) 5 MG tablet Take 1 tablet (5 mg total) by mouth daily. 12/27/23  Yes Audria Leather, MD  dexamethasone  (DECADRON ) 1 MG tablet Take 3 tablets (3 mg total) by mouth daily with breakfast for 7 days, THEN 2 tablets (2 mg total) daily with breakfast for 7 days, THEN 1 tablet (1 mg total) daily with breakfast for 7 days. 01/16/24 02/08/24 Yes Vaslow, Zachary K, MD  diclofenac  Sodium (VOLTAREN ) 1 % GEL Apply 2 g topically 4 (four) times daily. Patient taking differently: Apply 2 g topically as needed. 12/10/23  Yes Zelaya, Oscar A, PA-C  levETIRAcetam  (KEPPRA ) 500 MG tablet Take 1 tablet (500 mg total) by mouth 2 (two) times daily. 12/15/23  Yes Ghimire, Estil Heman, MD  metoprolol  tartrate (LOPRESSOR ) 25 MG tablet Take 1 tablet (25 mg total) by mouth 2 (two) times daily. 12/15/23  Yes Ghimire, Estil Heman, MD  pantoprazole  (PROTONIX ) 40 MG tablet Take 1 tablet (40 mg total) by mouth daily. 12/15/23 12/14/24 Yes Ghimire, Estil Heman, MD  polyethylene glycol (MIRALAX  /  GLYCOLAX ) 17 g packet Mix 1 packet (17 g) in 4-8 ounces of water and take by mouth daily. 12/27/23  Yes Audria Leather, MD  potassium chloride  SA (KLOR-CON  M) 20 MEQ tablet Take 1 tablet (20 mEq total) by mouth 2 (two) times daily. 01/16/24  Yes Marlene Simas, MD  senna (SENOKOT) 8.6 MG TABS tablet Take 1 tablet (8.6 mg total) by mouth 2 (two) times daily. 12/27/23  Yes Audria Leather, MD      Allergies    Pork allergy and Bee venom    Review of Systems   Review of Systems  Constitutional:  Negative for fever.  Respiratory:  Positive for shortness of breath.   Cardiovascular:  Positive for chest pain.  Gastrointestinal:  Negative for abdominal pain, nausea and vomiting.  All other systems reviewed and are negative.   Physical Exam Updated Vital Signs BP (!) 173/90   Pulse 80   Temp 98 F (36.7 C) (Oral)   Resp 18   Ht 1.676 m (5\' 6" )   Wt 54.4 kg   LMP 06/06/2010   SpO2 100%   BMI 19.37 kg/m  Physical Exam Vitals and nursing note reviewed.  Constitutional:      Comments: Chronically ill-appearing but nontoxic, no acute distress  HENT:     Head: Normocephalic and atraumatic.  Eyes:     Pupils: Pupils are equal, round,  and reactive to light.  Cardiovascular:     Rate and Rhythm: Normal rate and regular rhythm.     Heart sounds: Normal heart sounds.  Pulmonary:     Effort: Pulmonary effort is normal. No respiratory distress.     Breath sounds: No wheezing.  Abdominal:     General: Bowel sounds are normal.     Palpations: Abdomen is soft.  Musculoskeletal:     Cervical back: Neck supple.     Comments: Right arm with scarring noted of the right upper arm, no significant swelling noted, 2+ radial pulse distally  Skin:    General: Skin is warm and dry.  Neurological:     Mental Status: She is alert and oriented to person, place, and time.     ED Results / Procedures / Treatments   Labs (all labs ordered are listed, but only abnormal results are displayed) Labs  Reviewed  BASIC METABOLIC PANEL WITH GFR - Abnormal; Notable for the following components:      Result Value   Sodium 133 (*)    Chloride 97 (*)    All other components within normal limits  CBC - Abnormal; Notable for the following components:   WBC 15.0 (*)    RDW 17.9 (*)    Platelets 513 (*)    All other components within normal limits  BRAIN NATRIURETIC PEPTIDE - Abnormal; Notable for the following components:   B Natriuretic Peptide 103.3 (*)    All other components within normal limits  TROPONIN I (HIGH SENSITIVITY) - Abnormal; Notable for the following components:   Troponin I (High Sensitivity) 35 (*)    All other components within normal limits  TROPONIN I (HIGH SENSITIVITY) - Abnormal; Notable for the following components:   Troponin I (High Sensitivity) 31 (*)    All other components within normal limits    EKG EKG Interpretation Date/Time:  Friday Jan 27 2024 22:31:08 EDT Ventricular Rate:  83 PR Interval:  132 QRS Duration:  66 QT Interval:  336 QTC Calculation: 394 R Axis:   31  Text Interpretation: Normal sinus rhythm Possible Left atrial enlargement Borderline ECG When compared with ECG of 19-Dec-2023 01:48, PREVIOUS ECG IS PRESENT Confirmed by Donita Furrow ([id number]) on 01/28/2024 12:21:58 AM  Radiology CT Angio Chest PE W and/or Wo Contrast Result Date: 01/28/2024 CLINICAL DATA:  Pulmonary embolism (PE) suspected, high prob. Chest pain EXAM: CT ANGIOGRAPHY CHEST WITH CONTRAST TECHNIQUE: Multidetector CT imaging of the chest was performed using the standard protocol during bolus administration of intravenous contrast. Multiplanar CT image reconstructions and MIPs were obtained to evaluate the vascular anatomy. RADIATION DOSE REDUCTION: This exam was performed according to the departmental dose-optimization program which includes automated exposure control, adjustment of the mA and/or kV according to patient size and/or use of iterative reconstruction technique.  CONTRAST:  75mL OMNIPAQUE  IOHEXOL  350 MG/ML SOLN COMPARISON:  CT angio chest 12/12/2023, PET CT 10/27/2023 FINDINGS: Cardiovascular: Satisfactory opacification of the pulmonary arteries to the segmental level. No evidence of pulmonary embolism. The main pulmonary artery is normal in caliber. Normal heart size. No significant pericardial effusion. The thoracic aorta is normal in caliber. No atherosclerotic plaque of the thoracic aorta. No coronary artery calcifications. Mediastinum/Nodes: Redemonstration of right hilar lymphadenopathy with as an example a 1.1 cm lymph node (7:162). Redemonstration of right axillary lymphadenopathy. As an example a 1.3 cm lymph node (5:30). No enlarged mediastinal, left hilar, or axillary lymph nodes. Thyroid  gland, trachea, and esophagus demonstrate no significant findings. Lungs/Pleura:  Paraseptal and centrilobular moderate emphysema. Bibasilar reticulations and lingular and lower lobe honeycombing. Interval decrease in size of a 2.3 x 1.6 cm (from 2.7 x 1.9 cm) right apical nodular opacity. Persistent biapical pleural/pulmonary scarring. No pulmonary mass. Trace right apical loculated pleural effusion. No pneumothorax. Upper Abdomen: No acute abnormality. Musculoskeletal: No chest wall abnormality. Interval increase in size of a manubrial lytic lesion with associated 3.4 x 2.1 cm soft tissue density (9:72, 5:32). No acute displaced fracture. Multilevel degenerative changes of the spine. Review of the MIP images confirms the above findings. IMPRESSION: 1. No pulmonary embolus. 2. Interval decrease in size of a 2.3 x 1.6 cm (from 2.7 x 1.9 cm) right apical nodular opacity consistent with known malignancy. 3. Persistent right hilar and axillary lymphadenopathy. 4. Interval increase in size of a metastatic osseous manubrial lytic lesion with associated 3.4 x 2.1 cm soft tissue density. 5. Trace right apical loculated pleural effusion. 6. Pulmonary fibrosis/interstitial lung disease as  well as emphysema. 7. Aortic Atherosclerosis (ICD10-I70.0) and Emphysema (ICD10-J43.9). Electronically Signed   By: Morgane  Naveau M.D.   On: 01/28/2024 01:38   DG Chest 2 View Result Date: 01/27/2024 CLINICAL DATA:  Chest pain EXAM: CHEST - 2 VIEW COMPARISON:  Radiograph and CT 12/12/2023 FINDINGS: Stable cardiomediastinal silhouette. Aortic atherosclerotic calcification. Chronic interstitial lung disease with biapical pleuroparenchymal scarring, bronchitic changes, and reticular opacities in the lower lungs. The known right apical mass is redemonstrated and better evaluated on CT. No pleural effusion or pneumothorax. No displaced rib fracture. Lytic lesion in the right mid humerus. IMPRESSION: Chronic interstitial lung disease similar to 12/12/2023. Known right apical mass is redemonstrated. Lytic lesion in the right mid humerus. Electronically Signed   By: Rozell Cornet M.D.   On: 01/27/2024 23:03    Procedures Procedures    Medications Ordered in ED Medications  iohexol  (OMNIPAQUE ) 350 MG/ML injection 75 mL (75 mLs Intravenous Contrast Given 01/28/24 0124)  oxyCODONE -acetaminophen  (PERCOCET/ROXICET) 5-325 MG per tablet 1 tablet (1 tablet Oral Given 01/28/24 0245)    ED Course/ Medical Decision Making/ A&P                                 Medical Decision Making Amount and/or Complexity of Data Reviewed Labs: ordered. Radiology: ordered.  Risk Prescription drug management.   This patient presents to the ED for concern of chest pain, arm pain, this involves an extensive number of treatment options, and is a complaint that carries with it a high risk of complications and morbidity.  I considered the following differential and admission for this acute, potentially life threatening condition.  The differential diagnosis includes ACS, PE, pneumothorax, pneumonia, chest wall pain  MDM:    This is a 58 year old female with a history of cancer who presents with chest and arm pain.  She is  nontoxic.  Vital signs notable for blood pressure of 151/95.  EKG shows no evidence of acute arrhythmia or ischemia.  Chest x-ray shows persistent mass and known lung cancer.  No history of PE but given her cancer history, she is high risk.  For this reason CT PE study was obtained.  There is no evidence of acute pulmonary embolism.  There is persistent adenopathy and lung mass as well as lytic lesions that were known.  Labs notable for troponin of 35 and then 31.  Most recent troponin 27.  Do not feel like this represents true ACS as EKG  is reassuring and troponin is flat and downtrending.  Will have her follow-up with cardiology.  Regarding her arm pain, likely related to prior injury and pathologic fracture.  Continue Tylenol  at home.  (Labs, imaging, consults)  Labs: I Ordered, and personally interpreted labs.  The pertinent results include: CBC, BMP, troponin x 2  Imaging Studies ordered: I ordered imaging studies including chest x-ray, CT PE study I independently visualized and interpreted imaging. I agree with the radiologist interpretation  Additional history obtained from chart review.  External records from outside source obtained and reviewed including oncology notes  Cardiac Monitoring: The patient was maintained on a cardiac monitor.  If on the cardiac monitor, I personally viewed and interpreted the cardiac monitored which showed an underlying rhythm of: Sinus  Reevaluation: After the interventions noted above, I reevaluated the patient and found that they have :improved  Social Determinants of Health:  lives independently  Disposition: Discharged  Co morbidities that complicate the patient evaluation  Past Medical History:  Diagnosis Date   Allergy    Anxiety    Brain mass    Cancer (HCC)    COPD (chronic obstructive pulmonary disease) (HCC)    Emphysema of lung (HCC)    Hypertension      Medicines Meds ordered this encounter  Medications   iohexol  (OMNIPAQUE )  350 MG/ML injection 75 mL   oxyCODONE -acetaminophen  (PERCOCET/ROXICET) 5-325 MG per tablet 1 tablet    Refill:  0    I have reviewed the patients home medicines and have made adjustments as needed  Problem List / ED Course: Problem List Items Addressed This Visit   None Visit Diagnoses       Atypical chest pain    -  Primary     Right arm pain                       Final Clinical Impression(s) / ED Diagnoses Final diagnoses:  Atypical chest pain  Right arm pain    Rx / DC Orders ED Discharge Orders     None         Rory Collard, MD 01/28/24 [phone]

## 2024-01-31 ENCOUNTER — Other Ambulatory Visit (HOSPITAL_COMMUNITY): Payer: Self-pay

## 2024-01-31 ENCOUNTER — Other Ambulatory Visit: Payer: Self-pay

## 2024-01-31 ENCOUNTER — Emergency Department (HOSPITAL_COMMUNITY)
Admission: EM | Admit: 2024-01-31 | Discharge: 2024-02-01 | Disposition: A | Discharge status: Home / Self Care | Payer: MEDICAID | Attending: Emergency Medicine | Admitting: Emergency Medicine

## 2024-01-31 ENCOUNTER — Emergency Department (HOSPITAL_COMMUNITY): Payer: MEDICAID

## 2024-01-31 DIAGNOSIS — M25511 Pain in right shoulder: Secondary | ICD-10-CM | POA: Insufficient documentation

## 2024-01-31 MED ORDER — ACETAMINOPHEN 500 MG PO TABS
1000.0000 mg | ORAL_TABLET | Freq: Once | ORAL | Status: AC
  Administered 2024-01-31: 1000 mg via ORAL
  Filled 2024-01-31: qty 2

## 2024-01-31 MED ORDER — OXYCODONE HCL 5 MG PO TABS
5.0000 mg | ORAL_TABLET | Freq: Once | ORAL | Status: AC
  Administered 2024-01-31: 5 mg via ORAL
  Filled 2024-01-31: qty 1

## 2024-01-31 NOTE — ED Triage Notes (Signed)
 Pt has lung CA with mets to her bones. She states that she came in tonight because her back pain has been worsening recently and she has nothing prescribed for pain.

## 2024-01-31 NOTE — ED Notes (Signed)
 Patient did not elope. Patient is waiting to be seen by a MD. Patient in in the room.

## 2024-01-31 NOTE — ED Provider Notes (Signed)
  EMERGENCY DEPARTMENT AT [facility] Provider Note   CSN: [id number] Arrival date & time: 01/31/24  2037     History No chief complaint on file.   HPI Regina [name] is a 58 y.o. female presenting for chief complaint of diffuse msk pain. States that she has metastatic cancer States that she has been having diffuse arthralgias in her body. Involves her right arm.  Recent visit for similar.  It appears that patient has an OP order for oxycontin  that is pending authorization from her insurance company. Patient states she was told she could not pick it up.  Denies fevers/chills/nausea vomitting syncope or SOB. Patient's recorded medical, surgical, social, medication list and allergies were reviewed in the Snapshot window as part of the initial history.   Review of Systems   Review of Systems  Constitutional:  Negative for chills and fever.  HENT:  Negative for ear pain and sore throat.   Eyes:  Negative for pain and visual disturbance.  Respiratory:  Negative for cough and shortness of breath.   Cardiovascular:  Negative for chest pain and palpitations.  Gastrointestinal:  Negative for abdominal pain and vomiting.  Genitourinary:  Negative for dysuria and hematuria.  Musculoskeletal:  Negative for arthralgias and back pain.  Skin:  Negative for color change and rash.  Neurological:  Negative for seizures and syncope.  All other systems reviewed and are negative.   Physical Exam Updated Vital Signs BP (!) 162/97 (BP Location: Left Arm)   Pulse 82   Temp 98.2 F (36.8 C) (Oral)   Resp 17   LMP 06/06/2010   SpO2 97%  Physical Exam Vitals and nursing note reviewed.  Constitutional:      General: She is not in acute distress.    Appearance: She is well-developed.  HENT:     Head: Normocephalic and atraumatic.  Eyes:     Conjunctiva/sclera: Conjunctivae normal.  Cardiovascular:     Rate and Rhythm: Normal rate and regular rhythm.     Heart sounds: No  murmur heard. Pulmonary:     Effort: Pulmonary effort is normal. No respiratory distress.     Breath sounds: Normal breath sounds.  Abdominal:     General: There is no distension.     Palpations: Abdomen is soft.     Tenderness: There is no abdominal tenderness. There is no right CVA tenderness or left CVA tenderness.  Musculoskeletal:        General: No swelling or tenderness. Normal range of motion.     Cervical back: Neck supple.  Skin:    General: Skin is warm and dry.  Neurological:     General: No focal deficit present.     Mental Status: She is alert and oriented to person, place, and time. Mental status is at baseline.     Cranial Nerves: No cranial nerve deficit.      ED Course/ Medical Decision Making/ A&P    Procedures Procedures   Medications Ordered in ED Medications  oxyCODONE  (Oxy IR/ROXICODONE ) immediate release tablet 5 mg (5 mg Oral Given 01/31/24 2354)  acetaminophen  (TYLENOL ) tablet 1,000 mg (1,000 mg Oral Given 01/31/24 2354)    Medical Decision Making:   Regina [name] is a 58 y.o. female who presented to the ED today with diffuse musculoskeletal pain detailed above.    Patient placed on continuous vitals and telemetry monitoring while in ED which was reviewed periodically.  Complete initial physical exam performed, notably the patient  was hemodynamically stable  no acute distress.    Reviewed and confirmed nursing documentation for past medical history, family history, social history.    Initial Assessment:   With the patient's presentation of these musculoskeletal pain, most likely diagnosis is cancer related pain. Other diagnoses were considered including (but not limited to) chest etiology including pneumonia, pneumothorax, ACS, PE. These are considered less likely due to history of present illness and physical exam findings.   This is most consistent with an acute life/limb threatening illness complicated by underlying chronic conditions.  Initial  Plan:  Screening labs including CBC and Metabolic panel to evaluate for infectious or metabolic etiology of disease.  CXR to evaluate for structural/infectious intrathoracic pathology.  EKG and single troponin to evaluate for cardiac pathology. Single troponin appropriate due to greater than 6 hours since maximal intensity of symptoms. Objective evaluation as below reviewed   Initial Study Results:   Laboratory  All laboratory results reviewed without evidence of clinically relevant pathology.    EKG EKG was reviewed independently. Rate, rhythm, axis, intervals all examined and without medically relevant abnormality. ST segments without concerns for elevations.    Radiology:  All images reviewed independently. Agree with radiology report at this time.   DG Chest Portable 1 View Result Date: 02/01/2024 CLINICAL DATA:  Chest pain and shortness of breath. EXAM: PORTABLE CHEST 1 VIEW COMPARISON:  Jan 27, 2024 FINDINGS: The heart size and mediastinal contours are within normal limits. Mild to moderate severity calcification of the aortic arch is seen. There is evidence of emphysematous lung disease, chronic appearing increased interstitial lung markings and bilateral areas of pulmonary fibrosis. There is stable biapical pleural thickening, scarring and/or atelectasis with hazy opacification along the lateral aspect of the right apex in the area of the patient's known right apical mass. No pleural effusion or pneumothorax is identified. A radiopaque intramedullary rod and proximal fixation screws are seen within the right humerus. An ill-defined lytic area is also seen along the medial aspect of the mid right humeral shaft. IMPRESSION: COPD and findings which likely correspond to the patient's known lateral right apical mass. Electronically Signed   By: Virgle Grime M.D.   On: 02/01/2024 00:12   CT Angio Chest PE W and/or Wo Contrast Result Date: 01/28/2024 CLINICAL DATA:  Pulmonary embolism (PE)  suspected, high prob. Chest pain EXAM: CT ANGIOGRAPHY CHEST WITH CONTRAST TECHNIQUE: Multidetector CT imaging of the chest was performed using the standard protocol during bolus administration of intravenous contrast. Multiplanar CT image reconstructions and MIPs were obtained to evaluate the vascular anatomy. RADIATION DOSE REDUCTION: This exam was performed according to the departmental dose-optimization program which includes automated exposure control, adjustment of the mA and/or kV according to patient size and/or use of iterative reconstruction technique. CONTRAST:  75mL OMNIPAQUE  IOHEXOL  350 MG/ML SOLN COMPARISON:  CT angio chest 12/12/2023, PET CT 10/27/2023 FINDINGS: Cardiovascular: Satisfactory opacification of the pulmonary arteries to the segmental level. No evidence of pulmonary embolism. The main pulmonary artery is normal in caliber. Normal heart size. No significant pericardial effusion. The thoracic aorta is normal in caliber. No atherosclerotic plaque of the thoracic aorta. No coronary artery calcifications. Mediastinum/Nodes: Redemonstration of right hilar lymphadenopathy with as an example a 1.1 cm lymph node (7:162). Redemonstration of right axillary lymphadenopathy. As an example a 1.3 cm lymph node (5:30). No enlarged mediastinal, left hilar, or axillary lymph nodes. Thyroid  gland, trachea, and esophagus demonstrate no significant findings. Lungs/Pleura: Paraseptal and centrilobular moderate emphysema. Bibasilar reticulations and lingular and lower lobe honeycombing.  Interval decrease in size of a 2.3 x 1.6 cm (from 2.7 x 1.9 cm) right apical nodular opacity. Persistent biapical pleural/pulmonary scarring. No pulmonary mass. Trace right apical loculated pleural effusion. No pneumothorax. Upper Abdomen: No acute abnormality. Musculoskeletal: No chest wall abnormality. Interval increase in size of a manubrial lytic lesion with associated 3.4 x 2.1 cm soft tissue density (9:72, 5:32). No acute  displaced fracture. Multilevel degenerative changes of the spine. Review of the MIP images confirms the above findings. IMPRESSION: 1. No pulmonary embolus. 2. Interval decrease in size of a 2.3 x 1.6 cm (from 2.7 x 1.9 cm) right apical nodular opacity consistent with known malignancy. 3. Persistent right hilar and axillary lymphadenopathy. 4. Interval increase in size of a metastatic osseous manubrial lytic lesion with associated 3.4 x 2.1 cm soft tissue density. 5. Trace right apical loculated pleural effusion. 6. Pulmonary fibrosis/interstitial lung disease as well as emphysema. 7. Aortic Atherosclerosis (ICD10-I70.0) and Emphysema (ICD10-J43.9). Electronically Signed   By: Morgane  Naveau M.D.   On: 01/28/2024 01:38   DG Chest 2 View Result Date: 01/27/2024 CLINICAL DATA:  Chest pain EXAM: CHEST - 2 VIEW COMPARISON:  Radiograph and CT 12/12/2023 FINDINGS: Stable cardiomediastinal silhouette. Aortic atherosclerotic calcification. Chronic interstitial lung disease with biapical pleuroparenchymal scarring, bronchitic changes, and reticular opacities in the lower lungs. The known right apical mass is redemonstrated and better evaluated on CT. No pleural effusion or pneumothorax. No displaced rib fracture. Lytic lesion in the right mid humerus. IMPRESSION: Chronic interstitial lung disease similar to 12/12/2023. Known right apical mass is redemonstrated. Lytic lesion in the right mid humerus. Electronically Signed   By: Rozell Cornet M.D.   On: 01/27/2024 23:03  Reassessment and Plan:   Chest x-ray consistent with prior, troponin consistent with prior.  Symptoms resolved after treatment of her pain in the emergency room.  Likely musculoskeletal related to her cancer.  Recommend follow-up with her primary cancer provider for possible palliative referral given the degree of her disease. Otherwise ambulatory tolerating p.o. intake.  Supportive care sent to her pharmacy in the interim.   Disposition:  I have  considered need for hospitalization, however, considering all of the above, I believe this patient is stable for discharge at this time.  Patient/family educated about specific return precautions for given chief complaint and symptoms.  Patient/family educated about follow-up with PCP.     Patient/family expressed understanding of return precautions and need for follow-up. Patient spoken to regarding all imaging and laboratory results and appropriate follow up for these results. All education provided in verbal form with additional information in written form. Time was allowed for answering of patient questions. Patient discharged.    Emergency Department Medication Summary:   Medications  oxyCODONE  (Oxy IR/ROXICODONE ) immediate release tablet 5 mg (5 mg Oral Given 01/31/24 2354)  acetaminophen  (TYLENOL ) tablet 1,000 mg (1,000 mg Oral Given 01/31/24 2354)         Clinical Impression:  1. Acute pain of right shoulder      Data Unavailable   Final Clinical Impression(s) / ED Diagnoses Final diagnoses:  Acute pain of right shoulder    Rx / DC Orders ED Discharge Orders          Ordered    oxyCODONE  (ROXICODONE ) 5 MG immediate release tablet  Every 6 hours PRN        02/01/24 0119    acetaminophen  (TYLENOL ) 500 MG tablet  Every 8 hours PRN        02/01/24 0119  Onetha Bile, MD 02/01/24 [phone]

## 2024-01-31 NOTE — ED Provider Notes (Deleted)
 I was informed this patient eloped from the ER prior to my evaluation.   Onetha Bile, MD 01/31/24 [phone]

## 2024-02-01 ENCOUNTER — Encounter: Payer: Self-pay | Admitting: Urology

## 2024-02-01 ENCOUNTER — Ambulatory Visit
Admission: RE | Admit: 2024-02-01 | Discharge: 2024-02-01 | Disposition: A | Discharge status: Home / Self Care | Payer: MEDICAID | Source: Ambulatory Visit | Attending: Urology | Admitting: Urology

## 2024-02-01 LAB — BASIC METABOLIC PANEL WITH GFR
Anion gap: 9 (ref 5–15)
BUN: 6 mg/dL (ref 6–20)
CO2: 27 mmol/L (ref 22–32)
Calcium: 8.9 mg/dL (ref 8.9–10.3)
Chloride: 96 mmol/L — ABNORMAL LOW (ref 98–111)
Creatinine, Ser: 0.75 mg/dL (ref 0.44–1.00)
GFR, Estimated: >60 mL/min (ref >60)
Glucose, Bld: 95 mg/dL (ref 70–99)
Potassium: 3.3 mmol/L — ABNORMAL LOW (ref 3.5–5.1)
Sodium: 132 mmol/L — ABNORMAL LOW (ref 135–145)

## 2024-02-01 LAB — CBC WITH DIFFERENTIAL/PLATELET
Abs Immature Granulocytes: 0.3 10*3/uL — ABNORMAL HIGH (ref 0.00–0.07)
Basophils Absolute: 0.1 10*3/uL (ref 0.0–0.1)
Basophils Relative: 1 %
Eosinophils Absolute: 0 10*3/uL (ref 0.0–0.5)
Eosinophils Relative: 1 %
HCT: 37.7 % (ref 36.0–46.0)
Hemoglobin: 12.5 g/dL (ref 12.0–15.0)
Immature Granulocytes: 4 %
Lymphocytes Relative: 13 %
Lymphs Abs: 1 10*3/uL (ref 0.7–4.0)
MCH: 28.7 pg (ref 26.0–34.0)
MCHC: 33.2 g/dL (ref 30.0–36.0)
MCV: 86.7 fL (ref 80.0–100.0)
Monocytes Absolute: 0.6 10*3/uL (ref 0.1–1.0)
Monocytes Relative: 8 %
Neutro Abs: 5.8 10*3/uL (ref 1.7–7.7)
Neutrophils Relative %: 73 %
Platelets: 376 10*3/uL (ref 150–400)
RBC: 4.35 MIL/uL (ref 3.87–5.11)
RDW: 18.1 % — ABNORMAL HIGH (ref 11.5–15.5)
WBC: 7.7 10*3/uL (ref 4.0–10.5)
nRBC: 0 % (ref 0.0–0.2)

## 2024-02-01 LAB — TROPONIN I (HIGH SENSITIVITY): Troponin I (High Sensitivity): 25 ng/L — ABNORMAL HIGH (ref <18)

## 2024-02-01 MED ORDER — ACETAMINOPHEN 500 MG PO TABS
1000.0000 mg | ORAL_TABLET | Freq: Three times a day (TID) | ORAL | 0 refills | Status: DC | PRN
Start: 2024-02-01 — End: 2024-04-06

## 2024-02-01 MED ORDER — OXYCODONE HCL 5 MG PO TABS: 5.0000 mg | ORAL_TABLET | Freq: Four times a day (QID) | ORAL | 0 refills | Status: DC | PRN

## 2024-02-01 NOTE — Progress Notes (Signed)
 I attempted to reach Mrs. Biscardi for her 1 month posttreatment follow-up call since completing palliative radiation to the painful left sacral metastasis but she was not available to take my call.  I did leave a message on her voicemail requesting she return my call.  Her her most recent visit with Dr. Liam Redhead, it sounds like she is having significant, persistent pain in the right upper extremity and he was recommending consideration for additional palliative radiation to the right upper arm.  I have requested our front office continue to try to reach her to offer a follow-up visit to discuss this with Dr. Lorri Rota.  Arta Bihari, MMS, PA-C [place]  [facility] Radiation Oncology Physician Assistant Direct Dial: [phone]  Fax: [phone]

## 2024-02-01 NOTE — Progress Notes (Signed)
 Primary adenocarcinoma of upper lobe of right lung (HCC).  Telephone appointment for follow up, due to Lung cancer metastatic to brain [facility]). I verified patient's identity x2 and began nursing interview.   Patient states issues as follows...   -Pain: diffused msk pain -rt arm 10/10 (oxycontin  is pending INS authorization) -Fatigue: Denies -Hair Loss: Denies -Skin: Redness (red splotches on chest) -Weakness: Denies -Loss of control of extremities: Denies -Headache: Denies -Seizure/ uncontrolled movement: Patient states "One past seizure." (could not provide any timeline details). -Vision: Blurry -Speech: Slurred -Confusion: Denies -Lungs: Occasional SOB. -Dexamethasone / steroids:Yes  Patient denies any other related issues at this time.   Meaningful use complete.   Patient aware of their telephone appointment w/ Ashlyn Bruning PA-C. I left my extension [phone] in case patient needs anything. Patient verbalized understanding. This concludes the nursing interview.   Patient preferred phone # [phone]   Regina Bodo, LPN

## 2024-02-02 ENCOUNTER — Other Ambulatory Visit: Payer: Self-pay

## 2024-02-05 ENCOUNTER — Other Ambulatory Visit: Payer: Self-pay

## 2024-02-06 ENCOUNTER — Other Ambulatory Visit (HOSPITAL_COMMUNITY): Payer: Self-pay

## 2024-02-06 ENCOUNTER — Emergency Department (HOSPITAL_COMMUNITY): Payer: MEDICAID

## 2024-02-06 ENCOUNTER — Other Ambulatory Visit: Payer: Self-pay

## 2024-02-06 ENCOUNTER — Inpatient Hospital Stay (HOSPITAL_COMMUNITY)
Admission: EM | Admit: 2024-02-06 | Discharge: 2024-02-09 | DRG: 948 | Disposition: A | Discharge status: Home / Self Care | Payer: MEDICAID | Attending: Internal Medicine | Admitting: Internal Medicine

## 2024-02-06 ENCOUNTER — Encounter (HOSPITAL_COMMUNITY): Payer: Self-pay | Admitting: Emergency Medicine

## 2024-02-06 ENCOUNTER — Inpatient Hospital Stay (HOSPITAL_COMMUNITY): Payer: MEDICAID

## 2024-02-06 DIAGNOSIS — J439 Emphysema, unspecified: Secondary | ICD-10-CM | POA: Diagnosis present

## 2024-02-06 DIAGNOSIS — R569 Unspecified convulsions: Secondary | ICD-10-CM | POA: Diagnosis present

## 2024-02-06 DIAGNOSIS — R4701 Aphasia: Secondary | ICD-10-CM | POA: Diagnosis present

## 2024-02-06 DIAGNOSIS — R220 Localized swelling, mass and lump, head: Secondary | ICD-10-CM | POA: Diagnosis present

## 2024-02-06 DIAGNOSIS — Z8249 Family history of ischemic heart disease and other diseases of the circulatory system: Secondary | ICD-10-CM

## 2024-02-06 DIAGNOSIS — Z91030 Bee allergy status: Secondary | ICD-10-CM | POA: Diagnosis not present

## 2024-02-06 DIAGNOSIS — C7931 Secondary malignant neoplasm of brain: Secondary | ICD-10-CM | POA: Diagnosis present

## 2024-02-06 DIAGNOSIS — G893 Neoplasm related pain (acute) (chronic): Principal | ICD-10-CM | POA: Diagnosis present

## 2024-02-06 DIAGNOSIS — M84521A Pathological fracture in neoplastic disease, right humerus, initial encounter for fracture: Secondary | ICD-10-CM | POA: Diagnosis present

## 2024-02-06 DIAGNOSIS — R29898 Other symptoms and signs involving the musculoskeletal system: Secondary | ICD-10-CM | POA: Insufficient documentation

## 2024-02-06 DIAGNOSIS — Z87891 Personal history of nicotine dependence: Secondary | ICD-10-CM | POA: Diagnosis not present

## 2024-02-06 DIAGNOSIS — Z7189 Other specified counseling: Secondary | ICD-10-CM | POA: Diagnosis not present

## 2024-02-06 DIAGNOSIS — Z515 Encounter for palliative care: Secondary | ICD-10-CM

## 2024-02-06 DIAGNOSIS — D630 Anemia in neoplastic disease: Secondary | ICD-10-CM | POA: Diagnosis present

## 2024-02-06 DIAGNOSIS — M79621 Pain in right upper arm: Secondary | ICD-10-CM | POA: Diagnosis present

## 2024-02-06 DIAGNOSIS — R471 Dysarthria and anarthria: Secondary | ICD-10-CM | POA: Insufficient documentation

## 2024-02-06 DIAGNOSIS — Z85118 Personal history of other malignant neoplasm of bronchus and lung: Secondary | ICD-10-CM

## 2024-02-06 DIAGNOSIS — C3490 Malignant neoplasm of unspecified part of unspecified bronchus or lung: Secondary | ICD-10-CM | POA: Diagnosis present

## 2024-02-06 DIAGNOSIS — R52 Pain, unspecified: Secondary | ICD-10-CM | POA: Diagnosis not present

## 2024-02-06 DIAGNOSIS — M79601 Pain in right arm: Secondary | ICD-10-CM | POA: Diagnosis not present

## 2024-02-06 DIAGNOSIS — M79602 Pain in left arm: Secondary | ICD-10-CM | POA: Diagnosis present

## 2024-02-06 DIAGNOSIS — C3411 Malignant neoplasm of upper lobe, right bronchus or lung: Secondary | ICD-10-CM | POA: Diagnosis present

## 2024-02-06 DIAGNOSIS — Z91014 Allergy to mammalian meats: Secondary | ICD-10-CM | POA: Diagnosis not present

## 2024-02-06 DIAGNOSIS — I10 Essential (primary) hypertension: Secondary | ICD-10-CM | POA: Diagnosis present

## 2024-02-06 DIAGNOSIS — Z79899 Other long term (current) drug therapy: Secondary | ICD-10-CM

## 2024-02-06 DIAGNOSIS — E876 Hypokalemia: Principal | ICD-10-CM | POA: Diagnosis present

## 2024-02-06 DIAGNOSIS — R531 Weakness: Secondary | ICD-10-CM | POA: Diagnosis present

## 2024-02-06 DIAGNOSIS — C7951 Secondary malignant neoplasm of bone: Secondary | ICD-10-CM | POA: Diagnosis present

## 2024-02-06 DIAGNOSIS — I498 Other specified cardiac arrhythmias: Secondary | ICD-10-CM

## 2024-02-06 LAB — I-STAT CG4 LACTIC ACID, ED: Lactic Acid, Venous: 1.2 mmol/L (ref 0.5–1.9)

## 2024-02-06 LAB — CBC
HCT: 37.7 % (ref 36.0–46.0)
Hemoglobin: 12.6 g/dL (ref 12.0–15.0)
MCH: 28.3 pg (ref 26.0–34.0)
MCHC: 33.4 g/dL (ref 30.0–36.0)
MCV: 84.7 fL (ref 80.0–100.0)
Platelets: 516 10*3/uL — ABNORMAL HIGH (ref 150–400)
RBC: 4.45 MIL/uL (ref 3.87–5.11)
RDW: 18 % — ABNORMAL HIGH (ref 11.5–15.5)
WBC: 8.4 10*3/uL (ref 4.0–10.5)
nRBC: 0 % (ref 0.0–0.2)

## 2024-02-06 LAB — TROPONIN I (HIGH SENSITIVITY)
Troponin I (High Sensitivity): 14 ng/L (ref <18)
Troponin I (High Sensitivity): 16 ng/L (ref <18)

## 2024-02-06 LAB — COMPREHENSIVE METABOLIC PANEL WITH GFR
ALT: 17 U/L (ref 0–44)
AST: 21 U/L (ref 15–41)
Albumin: 3.5 g/dL (ref 3.5–5.0)
Alkaline Phosphatase: 130 U/L — ABNORMAL HIGH (ref 38–126)
Anion gap: 13 (ref 5–15)
BUN: 8 mg/dL (ref 6–20)
CO2: 22 mmol/L (ref 22–32)
Calcium: 9.9 mg/dL (ref 8.9–10.3)
Chloride: 99 mmol/L (ref 98–111)
Creatinine, Ser: 0.54 mg/dL (ref 0.44–1.00)
GFR, Estimated: >60 mL/min (ref >60)
Glucose, Bld: 105 mg/dL — ABNORMAL HIGH (ref 70–99)
Potassium: 2.6 mmol/L — CL (ref 3.5–5.1)
Sodium: 134 mmol/L — ABNORMAL LOW (ref 135–145)
Total Bilirubin: 0.3 mg/dL (ref 0.0–1.2)
Total Protein: 6.9 g/dL (ref 6.5–8.1)

## 2024-02-06 LAB — MAGNESIUM: Magnesium: 1.6 mg/dL — ABNORMAL LOW (ref 1.7–2.4)

## 2024-02-06 MED ORDER — ONDANSETRON HCL 4 MG PO TABS: 4.0000 mg | ORAL_TABLET | Freq: Four times a day (QID) | ORAL | Status: DC | PRN

## 2024-02-06 MED ORDER — HYDROMORPHONE HCL 1 MG/ML IJ SOLN
1.0000 mg | Freq: Once | INTRAMUSCULAR | Status: AC
  Administered 2024-02-06: 1 mg via INTRAVENOUS
  Filled 2024-02-06: qty 1

## 2024-02-06 MED ORDER — PNEUMOCOCCAL 20-VAL CONJ VACC 0.5 ML IM SUSY
0.5000 mL | PREFILLED_SYRINGE | INTRAMUSCULAR | Status: DC
  Filled 2024-02-06: qty 0.5

## 2024-02-06 MED ORDER — OXYCODONE HCL 5 MG PO TABS
5.0000 mg | ORAL_TABLET | Freq: Four times a day (QID) | ORAL | Status: DC
  Administered 2024-02-06 – 2024-02-07 (×3): 5 mg via ORAL
  Filled 2024-02-06 (×3): qty 1

## 2024-02-06 MED ORDER — DEXAMETHASONE 4 MG PO TABS
6.0000 mg | ORAL_TABLET | Freq: Every day | ORAL | Status: DC
  Administered 2024-02-07 – 2024-02-09 (×3): 6 mg via ORAL
  Filled 2024-02-06 (×3): qty 2

## 2024-02-06 MED ORDER — BISACODYL 5 MG PO TBEC: 5.0000 mg | DELAYED_RELEASE_TABLET | Freq: Every day | ORAL | Status: DC | PRN

## 2024-02-06 MED ORDER — ONDANSETRON HCL 4 MG/2ML IJ SOLN: 4.0000 mg | Freq: Once | INTRAMUSCULAR | Status: DC

## 2024-02-06 MED ORDER — ACETAMINOPHEN 325 MG PO TABS
650.0000 mg | ORAL_TABLET | Freq: Four times a day (QID) | ORAL | Status: DC | PRN
  Administered 2024-02-08 – 2024-02-09 (×2): 650 mg via ORAL
  Filled 2024-02-06 (×2): qty 2

## 2024-02-06 MED ORDER — SODIUM CHLORIDE 0.9 % IV BOLUS
1000.0000 mL | Freq: Once | INTRAVENOUS | Status: AC
  Administered 2024-02-06: 1000 mL via INTRAVENOUS

## 2024-02-06 MED ORDER — POTASSIUM CHLORIDE CRYS ER 20 MEQ PO TBCR
40.0000 meq | EXTENDED_RELEASE_TABLET | Freq: Once | ORAL | Status: AC
  Administered 2024-02-06: 40 meq via ORAL
  Filled 2024-02-06: qty 2

## 2024-02-06 MED ORDER — ONDANSETRON HCL 4 MG/2ML IJ SOLN
4.0000 mg | Freq: Four times a day (QID) | INTRAMUSCULAR | Status: DC | PRN
Start: 2024-02-06 — End: 2024-02-09

## 2024-02-06 MED ORDER — DOCUSATE SODIUM 100 MG PO CAPS
100.0000 mg | ORAL_CAPSULE | Freq: Every day | ORAL | Status: DC
  Administered 2024-02-06 – 2024-02-08 (×3): 100 mg via ORAL
  Filled 2024-02-06 (×3): qty 1

## 2024-02-06 MED ORDER — METHYLPREDNISOLONE SODIUM SUCC 125 MG IJ SOLR
125.0000 mg | Freq: Once | INTRAMUSCULAR | Status: AC
  Administered 2024-02-06: 125 mg via INTRAVENOUS
  Filled 2024-02-06: qty 2

## 2024-02-06 MED ORDER — POTASSIUM CHLORIDE 10 MEQ/100ML IV SOLN
10.0000 meq | INTRAVENOUS | Status: AC
  Administered 2024-02-06 (×3): 10 meq via INTRAVENOUS
  Filled 2024-02-06 (×3): qty 100

## 2024-02-06 MED ORDER — HYDROMORPHONE HCL 1 MG/ML IJ SOLN
1.0000 mg | INTRAMUSCULAR | Status: DC | PRN
  Administered 2024-02-06 – 2024-02-07 (×3): 1 mg via INTRAVENOUS
  Filled 2024-02-06 (×3): qty 1

## 2024-02-06 MED ORDER — ACETAMINOPHEN 650 MG RE SUPP: 650.0000 mg | Freq: Four times a day (QID) | RECTAL | Status: DC | PRN

## 2024-02-06 MED ORDER — MAGNESIUM SULFATE IN D5W 1-5 GM/100ML-% IV SOLN
1.0000 g | Freq: Once | INTRAVENOUS | Status: AC
  Administered 2024-02-06: 1 g via INTRAVENOUS
  Filled 2024-02-06: qty 100

## 2024-02-06 NOTE — ED Provider Notes (Signed)
 [facility] EMERGENCY DEPARTMENT AT [facility] Provider Note   CSN: [id number] Arrival date & time: 02/06/24  1551     History  Chief Complaint  Patient presents with   Chest Pain    Regina [name] is a 58 y.o. female history of hypertension, metastatic lung cancer, here presenting with chest pain and leg pain.  Patient has metastatic lung cancer and is been followed with Dr. Liam Redhead.  Patient was seen here twice last week for diffuse pain.  Patient had a CTA that showed metastatic cancer and in particular has bone mets to the manubrium.  Patient states that she was prescribed oxycodone  but she still has chest pain and leg pain.  Patient states that the pain medicine is not controlling her pain.  Patient denies any fevers or chills  The history is provided by the patient.       Home Medications Prior to Admission medications   Medication Sig Start Date End Date Taking? Authorizing Provider  acetaminophen  (TYLENOL ) 500 MG tablet Take 2 tablets (1,000 mg total) by mouth every 8 (eight) hours as needed for mild pain (pain score 1-3) or moderate pain (pain score 4-6). 02/01/24   Onetha Bile, MD  amLODipine  (NORVASC ) 5 MG tablet Take 1 tablet (5 mg total) by mouth daily. 12/27/23   Audria Leather, MD  dexamethasone  (DECADRON ) 1 MG tablet Take 3 tablets (3 mg total) by mouth daily with breakfast for 7 days, THEN 2 tablets (2 mg total) daily with breakfast for 7 days, THEN 1 tablet (1 mg total) daily with breakfast for 7 days. 01/16/24 02/08/24  Vaslow, Zachary K, MD  diclofenac  Sodium (VOLTAREN ) 1 % GEL Apply 2 g topically 4 (four) times daily. Patient taking differently: Apply 2 g topically as needed. 12/10/23   Zelaya, Oscar A, PA-C  levETIRAcetam  (KEPPRA ) 500 MG tablet Take 1 tablet (500 mg total) by mouth 2 (two) times daily. 12/15/23   Ghimire, Estil Heman, MD  metoprolol  tartrate (LOPRESSOR ) 25 MG tablet Take 1 tablet (25 mg total) by mouth 2 (two) times daily. 12/15/23    Ghimire, Estil Heman, MD  oxyCODONE  (ROXICODONE ) 5 MG immediate release tablet Take 1 tablet (5 mg total) by mouth every 6 (six) hours as needed for severe pain (pain score 7-10). 02/01/24   Onetha Bile, MD  pantoprazole  (PROTONIX ) 40 MG tablet Take 1 tablet (40 mg total) by mouth daily. 12/15/23 12/14/24  Ghimire, Estil Heman, MD  polyethylene glycol (MIRALAX  / GLYCOLAX ) 17 g packet Mix 1 packet (17 g) in 4-8 ounces of water and take by mouth daily. 12/27/23   Audria Leather, MD  potassium chloride  SA (KLOR-CON  M) 20 MEQ tablet Take 1 tablet (20 mEq total) by mouth 2 (two) times daily. 01/16/24   Marlene Simas, MD  senna (SENOKOT) 8.6 MG TABS tablet Take 1 tablet (8.6 mg total) by mouth 2 (two) times daily. 12/27/23   Audria Leather, MD      Allergies    Pork allergy and Bee venom    Review of Systems   Review of Systems  Cardiovascular:  Positive for chest pain.  All other systems reviewed and are negative.   Physical Exam Updated Vital Signs BP (!) 151/72   Pulse (!) 107   Temp 98.6 F (37 C) (Oral)   Resp (!) 22   LMP 06/06/2010   SpO2 97%  Physical Exam Vitals and nursing note reviewed.  Constitutional:      Comments: Uncomfortable and chronically ill  HENT:  Head: Normocephalic.  Eyes:     Extraocular Movements: Extraocular movements intact.     Pupils: Pupils are equal, round, and reactive to light.  Cardiovascular:     Rate and Rhythm: Normal rate and regular rhythm.     Heart sounds: Normal heart sounds.  Pulmonary:     Effort: Pulmonary effort is normal.     Breath sounds: Normal breath sounds.  Abdominal:     General: Bowel sounds are normal.     Palpations: Abdomen is soft.  Musculoskeletal:        General: Normal range of motion.     Cervical back: Normal range of motion and neck supple.  Skin:    General: Skin is warm.     Capillary Refill: Capillary refill takes less than 2 seconds.  Neurological:     General: No focal deficit present.     Mental  Status: She is oriented to person, place, and time.  Psychiatric:        Mood and Affect: Mood normal.     ED Results / Procedures / Treatments   Labs (all labs ordered are listed, but only abnormal results are displayed) Labs Reviewed  CBC - Abnormal; Notable for the following components:      Result Value   RDW 18.0 (*)    Platelets 516 (*)    All other components within normal limits  COMPREHENSIVE METABOLIC PANEL WITH GFR - Abnormal; Notable for the following components:   Sodium 134 (*)    Potassium 2.6 (*)    Glucose, Bld 105 (*)    Alkaline Phosphatase 130 (*)    All other components within normal limits  MAGNESIUM  - Abnormal; Notable for the following components:   Magnesium  1.6 (*)    All other components within normal limits  I-STAT CG4 LACTIC ACID, ED  TROPONIN I (HIGH SENSITIVITY)  TROPONIN I (HIGH SENSITIVITY)    EKG EKG Interpretation Date/Time:  Monday February 06 2024 18:00:26 EDT Ventricular Rate:  105 PR Interval:  163 QRS Duration:  78 QT Interval:  333 QTC Calculation: 441 R Axis:   57  Text Interpretation: sinus arrhythmia Probable left atrial enlargement rate faster than previous Confirmed by Florette Hurry [phone]) on 02/06/2024 6:02:54 PM  Radiology DG Chest 2 View Result Date: 02/06/2024 CLINICAL DATA:  Shortness of breath with chest pain EXAM: CHEST - 2 VIEW COMPARISON:  Chest x-ray 01/31/2024.  CT of the chest 01/28/2024. FINDINGS: TAVR attic changes in the lung bases appear unchanged. Nodular density in the right lung apex is unchanged. There is no new focal lung infiltrate, pleural effusion or pneumothorax. The cardiomediastinal silhouette is within normal limits. IMPRESSION: 1. No active cardiopulmonary disease. 2. Stable nodular density in the right lung apex. Electronically Signed   By: Tyron Gallon M.D.   On: 02/06/2024 17:50    Procedures Procedures    CRITICAL CARE Performed by: Florette Hurry   Total critical care time: 38  minutes  Critical care time was exclusive of separately billable procedures and treating other patients.  Critical care was necessary to treat or prevent imminent or life-threatening deterioration.  Critical care was time spent personally by me on the following activities: development of treatment plan with patient and/or surrogate as well as nursing, discussions with consultants, evaluation of patient's response to treatment, examination of patient, obtaining history from patient or surrogate, ordering and performing treatments and interventions, ordering and review of laboratory studies, ordering and review of radiographic studies, pulse oximetry  and re-evaluation of patient's condition.   Medications Ordered in ED Medications  potassium chloride  10 mEq in 100 mL IVPB (10 mEq Intravenous New Bag/Given 02/06/24 1755)  magnesium  sulfate IVPB 1 g 100 mL (has no administration in time range)  ondansetron  (ZOFRAN ) injection 4 mg (has no administration in time range)  HYDROmorphone  (DILAUDID ) injection 1 mg (1 mg Intravenous Given 02/06/24 1725)  sodium chloride  0.9 % bolus 1,000 mL (1,000 mLs Intravenous New Bag/Given 02/06/24 1724)  potassium chloride  SA (KLOR-CON  M) CR tablet 40 mEq (40 mEq Oral Given 02/06/24 1755)    ED Course/ Medical Decision Making/ A&P                                 Medical Decision Making Regina [name] is a 58 y.o. female here with chest pain.  Patient has known mets to the bones including the manubrium from metastatic lung cancer.  I suspect that the pain is likely from metastatic cancer.  Patient just had the CTA last week that did not show any PE.  Plan to get CBC and CMP and troponin x 1 and chest x-ray.  Will give IV pain medicine and reassess.  6:26 PM I reviewed patient's labs and potassium is 2.6.  Magnesium  is low at 1.6.  Patient has been having nausea and diarrhea.  Patient also has new onset sinus ectopy and tachycardia.  Patient pain is improved with IV pain  meds.  At this point patient will be admitted for observation for hypokalemia and hypomagnesemia with EKG changes.  Problems Addressed: Hypokalemia: acute illness or injury Hypomagnesemia: acute illness or injury Sinus arrhythmia: acute illness or injury  Amount and/or Complexity of Data Reviewed Labs: ordered. Decision-making details documented in ED Course.  Risk Prescription drug management.    Final Clinical Impression(s) / ED Diagnoses Final diagnoses:  None    Rx / DC Orders ED Discharge Orders     None         Dalene Duck, MD 02/06/24 Darnell Elbe

## 2024-02-06 NOTE — ED Triage Notes (Signed)
 Patient c/o chest pain and SOB x 1 days. Patient report nausea denies vomiting. Hx of Lung Ca.

## 2024-02-06 NOTE — ED Provider Triage Note (Signed)
 Emergency Medicine Provider Triage Evaluation Note  Michell Giuliano , a 58 y.o. female  was evaluated in triage.  Pt complains of CP, SHOB, NV. Vomited three times today. Hx of lung cancer and mets to bone (currently not on chemo nor radiation). No diarrhea  Review of Systems  Positive: See hpi Negative: fevers  Physical Exam  BP (!) 156/86 (BP Location: Left Arm)   Pulse 80   Temp 98.6 F (37 C) (Oral)   Resp 18   LMP 06/06/2010   SpO2 97%  Gen:   Awake, no distress   Resp:  Normal effort  Cards:  Irregular rhythm, tachy from 80-120bpm MSK:   Moves extremities without difficulty. Chest ttp Other:  No abd ttp  Medical Decision Making  Medically screening exam initiated at 4:09 PM.  Appropriate orders placed.  Aleatha Stockhausen was informed that the remainder of the evaluation will be completed by another provider, this initial triage assessment does not replace that evaluation, and the importance of remaining in the ED until their evaluation is complete.  Labs, cxr, EKG ordered   Royann Cords, [place] 02/06/24 1615

## 2024-02-06 NOTE — H&P (Addendum)
 History and Physical    Patient: Regina [name] RUE:[id number] DOB: Sep 28, 1965 DOA: 02/06/2024 DOS: the patient was seen and examined on 02/06/2024 PCP: Marius Siemens, NP  Patient coming from: Home  Chief Complaint:  Chief Complaint  Patient presents with   Chest Pain   HPI: Regina [name] is a 58 y.o. female with medical history significant for non-small cell lung cancer with metastatic disease to the brain and bones.  The patient presented with intractable pain.  She is very emotional and her speech is very garbled since so she is interactive but I am not able to understand most of what she is saying. This seems to frustrate her more.  She is tearful.  She says "I do not know why I can't talk" She says the worst of her pain is in her left upper chest and both arms right worse than left.  Her right arm pain became even more severe 3 days ago when she reached for something.  Now the arm does not work.  She is not able to reach up or behind.   In the emergency department the patient did receive 1 dose of IV Dilaudid  but she was still rating her pain an 11 out of 10 at the time of my evaluation.   She shakes her head no that she does not have the oxycodone  or the Decadron  that are listed on her med list, but I am unable to under understand her explanation as to why She will be admitted for intractable pain. She says she has been eating and drinking fine she denies any significant diarrhea or constipation.  Review of Systems: unable to review all systems due to the inability of the patient to answer questions. Past Medical History:  Diagnosis Date   Allergy    Anxiety    Brain mass    Cancer (HCC)    COPD (chronic obstructive pulmonary disease) (HCC)    Emphysema of lung (HCC)    Hypertension    Past Surgical History:  Procedure Laterality Date   APPLICATION OF CRANIAL NAVIGATION Left 10/07/2023   Procedure: APPLICATION OF CRANIAL NAVIGATION;  Surgeon: Audie Bleacher, MD;  Location:  [facility] OR;  Service: Neurosurgery;  Laterality: Left;   CRANIOTOMY Left 10/07/2023   Procedure: FRONTAL CRANIOTOMY FOR ABSCESS;  Surgeon: Audie Bleacher, MD;  Location: [facility] OR;  Service: Neurosurgery;  Laterality: Left;   dental procedure     HUMERUS IM NAIL Right 10/10/2023   Procedure: INTRAMEDULLARY (IM) NAIL HUMERAL RIGHT;  Surgeon: Wilhelmenia Harada, MD;  Location: [facility] OR;  Service: Orthopedics;  Laterality: Right;   Social History:  reports that she quit smoking about 4 years ago. Her smoking use included cigarettes. She has never used smokeless tobacco. She reports current alcohol  use of about 6.0 standard drinks of alcohol  per week. She reports current drug use. Frequency: 4.00 times per week. Drug: Marijuana.  Allergies  Allergen Reactions   Pork Allergy Nausea And Vomiting   Bee Venom Hives    Family History  Problem Relation Age of Onset   Leukemia Mother    Hypertension Mother    Hypertension Father    Cancer Neg Hx    Diabetes Neg Hx    Heart disease Neg Hx    Colon cancer Neg Hx    Esophageal cancer Neg Hx    Rectal cancer Neg Hx    Stomach cancer Neg Hx     Prior to Admission medications   Medication Sig Start Date End Date Taking?  Authorizing Provider  acetaminophen  (TYLENOL ) 500 MG tablet Take 2 tablets (1,000 mg total) by mouth every 8 (eight) hours as needed for mild pain (pain score 1-3) or moderate pain (pain score 4-6). 02/01/24  Yes Onetha Bile, MD  dexamethasone  (DECADRON ) 1 MG tablet Take 3 tablets (3 mg total) by mouth daily with breakfast for 7 days, THEN 2 tablets (2 mg total) daily with breakfast for 7 days, THEN 1 tablet (1 mg total) daily with breakfast for 7 days. 01/16/24 02/08/24 Yes Vaslow, Zachary K, MD  amLODipine  (NORVASC ) 5 MG tablet Take 1 tablet (5 mg total) by mouth daily. 12/27/23   Audria Leather, MD  diclofenac  Sodium (VOLTAREN ) 1 % GEL Apply 2 g topically 4 (four) times daily. Patient taking differently: Apply 2 g topically as needed. 12/10/23    Zelaya, Oscar A, PA-C  levETIRAcetam  (KEPPRA ) 500 MG tablet Take 1 tablet (500 mg total) by mouth 2 (two) times daily. 12/15/23   Ghimire, Estil Heman, MD  metoprolol  tartrate (LOPRESSOR ) 25 MG tablet Take 1 tablet (25 mg total) by mouth 2 (two) times daily. 12/15/23   Ghimire, Estil Heman, MD  oxyCODONE  (ROXICODONE ) 5 MG immediate release tablet Take 1 tablet (5 mg total) by mouth every 6 (six) hours as needed for severe pain (pain score 7-10). 02/01/24   Onetha Bile, MD  pantoprazole  (PROTONIX ) 40 MG tablet Take 1 tablet (40 mg total) by mouth daily. 12/15/23 12/14/24  Ghimire, Estil Heman, MD  polyethylene glycol (MIRALAX  / GLYCOLAX ) 17 g packet Mix 1 packet (17 g) in 4-8 ounces of water and take by mouth daily. 12/27/23   Audria Leather, MD  potassium chloride  SA (KLOR-CON  M) 20 MEQ tablet Take 1 tablet (20 mEq total) by mouth 2 (two) times daily. 01/16/24   Marlene Simas, MD  senna (SENOKOT) 8.6 MG TABS tablet Take 1 tablet (8.6 mg total) by mouth 2 (two) times daily. 12/27/23   Audria Leather, MD    Physical Exam: Vitals:   02/06/24 1603 02/06/24 1615 02/06/24 1942  BP: (!) 156/86 (!) 151/72 (!) 150/90  Pulse: 80 (!) 107 88  Resp: 18 (!) 22 (!) 24  Temp: 98.6 F (37 C)    TempSrc: Oral    SpO2: 97% 97% 94%   Physical Exam:  General: No acute distress, well developed, well nourished HEENT: Normocephalic, atraumatic, PERRL Cardiovascular: Normal rate and rhythm. Distal pulses intact. Pulmonary: Normal pulmonary effort, normal breath sounds Gastrointestinal: Nondistended abdomen, soft, non-tender, normoactive bowel sounds Musculoskeletal:right arm has a large bony prominence protruding from her humerus,  There is some abnormal hyperpigmentation perhaps secondary to her previous radiation.  She holds the right arm in her lap and uses the left arm to move her right. no lower ext edema Neuro: Flaccid right arm, extremely garbled speech and difficulty with word finding. AAOx3. PSYCH:  Attentive and cooperative and tearful  Data Reviewed:  Results for orders placed or performed during the hospital encounter of 02/06/24 (from the past 24 hours)  CBC     Status: Abnormal   Collection Time: 02/06/24  4:17 PM  Result Value Ref Range   WBC 8.4 4.0 - 10.5 K/uL   RBC 4.45 3.87 - 5.11 MIL/uL   Hemoglobin 12.6 12.0 - 15.0 g/dL   HCT 16.1 09.6 - 04.5 %   MCV 84.7 80.0 - 100.0 fL   MCH 28.3 26.0 - 34.0 pg   MCHC 33.4 30.0 - 36.0 g/dL   RDW 40.9 (H) 81.1 - 91.4 %  Platelets 516 (H) 150 - 400 K/uL   nRBC 0.0 0.0 - 0.2 %  Troponin I (High Sensitivity)     Status: None   Collection Time: 02/06/24  4:17 PM  Result Value Ref Range   Troponin I (High Sensitivity) 14 <18 ng/L  Comprehensive metabolic panel     Status: Abnormal   Collection Time: 02/06/24  4:17 PM  Result Value Ref Range   Sodium 134 (L) 135 - 145 mmol/L   Potassium 2.6 (LL) 3.5 - 5.1 mmol/L   Chloride 99 98 - 111 mmol/L   CO2 22 22 - 32 mmol/L   Glucose, Bld 105 (H) 70 - 99 mg/dL   BUN 8 6 - 20 mg/dL   Creatinine, Ser 5.62 0.44 - 1.00 mg/dL   Calcium  9.9 8.9 - 10.3 mg/dL   Total Protein 6.9 6.5 - 8.1 g/dL   Albumin 3.5 3.5 - 5.0 g/dL   AST 21 15 - 41 U/L   ALT 17 0 - 44 U/L   Alkaline Phosphatase 130 (H) 38 - 126 U/L   Total Bilirubin 0.3 0.0 - 1.2 mg/dL   GFR, Estimated >13 >08 mL/min   Anion gap 13 5 - 15  Magnesium      Status: Abnormal   Collection Time: 02/06/24  4:17 PM  Result Value Ref Range   Magnesium  1.6 (L) 1.7 - 2.4 mg/dL  I-Stat CG4 Lactic Acid     Status: None   Collection Time: 02/06/24  4:34 PM  Result Value Ref Range   Lactic Acid, Venous 1.2 0.5 - 1.9 mmol/L     Assessment and Plan: Intractable cancer pain, especially in left chest and both arms -  - It appears she is supposed to be on scheduled oxycodone  as an outpatient.  Will order that now. She says one of her doctors was trying to stop her pain medication but I am not able to understand why. -Palliative care  assistance - Try to figure out why she is has been unable to get the OxyContin .  Perhaps a fentanyl  patch would be easier for her to obtain.  Try to ensure that some long-acting pain medication is available to her prior to discharge - Facilitate restart of radiation for pain control.  - Add Colace to prevent OIC.  2. Difficulty speaking - known metastatic disease to the brain.  She has had some cerebral edema with mild mass effect in the past.   -Will restart high-dose steroids empirically.  Get oncology opinion on what the steroid dose should be. - Consider repeat MRI brain  3. Face swelling - Await oncology opinion.  4. Hypokalemia/ Hypomagnesemia - Replete   Advance Care Planning:   Code Status: Full Code the patient names her daughter Kayleen Party as her surrogate decision maker and she wants to be full code.  Consults: Palliative care  Family Communication: None  Severity of Illness: The appropriate patient status for this patient is INPATIENT. Inpatient status is judged to be reasonable and necessary in order to provide the required intensity of service to ensure the patient's safety. The patient's presenting symptoms, physical exam findings, and initial radiographic and laboratory data in the context of their chronic comorbidities is felt to place them at high risk for further clinical deterioration. Furthermore, it is not anticipated that the patient will be medically stable for discharge from the hospital within 2 midnights of admission.   * I certify that at the point of admission it is my clinical judgment that the patient  will require inpatient hospital care spanning beyond 2 midnights from the point of admission due to high intensity of service, high risk for further deterioration and high frequency of surveillance required.*  Author: Willadean Hark, MD 02/06/2024 7:58 PM  For on call review [email or web address].

## 2024-02-07 ENCOUNTER — Ambulatory Visit
Admit: 2024-02-07 | Discharge: 2024-02-07 | Disposition: A | Discharge status: Home / Self Care | Payer: MEDICAID | Attending: Radiation Oncology | Admitting: Radiation Oncology

## 2024-02-07 DIAGNOSIS — Z7189 Other specified counseling: Secondary | ICD-10-CM

## 2024-02-07 DIAGNOSIS — C7951 Secondary malignant neoplasm of bone: Secondary | ICD-10-CM

## 2024-02-07 DIAGNOSIS — R4701 Aphasia: Secondary | ICD-10-CM

## 2024-02-07 DIAGNOSIS — Z515 Encounter for palliative care: Secondary | ICD-10-CM

## 2024-02-07 DIAGNOSIS — G893 Neoplasm related pain (acute) (chronic): Principal | ICD-10-CM

## 2024-02-07 LAB — BASIC METABOLIC PANEL WITH GFR
Anion gap: 9 (ref 5–15)
BUN: 5 mg/dL — ABNORMAL LOW (ref 6–20)
CO2: 22 mmol/L (ref 22–32)
Calcium: 9.3 mg/dL (ref 8.9–10.3)
Chloride: 103 mmol/L (ref 98–111)
Creatinine, Ser: 0.41 mg/dL — ABNORMAL LOW (ref 0.44–1.00)
GFR, Estimated: >60 mL/min (ref >60)
Glucose, Bld: 139 mg/dL — ABNORMAL HIGH (ref 70–99)
Potassium: 3.5 mmol/L (ref 3.5–5.1)
Sodium: 134 mmol/L — ABNORMAL LOW (ref 135–145)

## 2024-02-07 LAB — CBC
HCT: 37.3 % (ref 36.0–46.0)
Hemoglobin: 12.3 g/dL (ref 12.0–15.0)
MCH: 28.6 pg (ref 26.0–34.0)
MCHC: 33 g/dL (ref 30.0–36.0)
MCV: 86.7 fL (ref 80.0–100.0)
Platelets: 438 10*3/uL — ABNORMAL HIGH (ref 150–400)
RBC: 4.3 MIL/uL (ref 3.87–5.11)
RDW: 17.8 % — ABNORMAL HIGH (ref 11.5–15.5)
WBC: 7.2 10*3/uL (ref 4.0–10.5)
nRBC: 0 % (ref 0.0–0.2)

## 2024-02-07 LAB — MAGNESIUM: Magnesium: 1.7 mg/dL (ref 1.7–2.4)

## 2024-02-07 MED ORDER — POTASSIUM CHLORIDE CRYS ER 20 MEQ PO TBCR
40.0000 meq | EXTENDED_RELEASE_TABLET | Freq: Once | ORAL | Status: AC
  Administered 2024-02-07: 40 meq via ORAL
  Filled 2024-02-07: qty 2

## 2024-02-07 MED ORDER — AMLODIPINE BESYLATE 5 MG PO TABS
5.0000 mg | ORAL_TABLET | Freq: Every day | ORAL | Status: DC
  Administered 2024-02-07 – 2024-02-09 (×3): 5 mg via ORAL
  Filled 2024-02-07 (×3): qty 1

## 2024-02-07 MED ORDER — LEVETIRACETAM 500 MG PO TABS
500.0000 mg | ORAL_TABLET | Freq: Two times a day (BID) | ORAL | Status: DC
  Administered 2024-02-07 – 2024-02-09 (×5): 500 mg via ORAL
  Filled 2024-02-07 (×5): qty 1

## 2024-02-07 MED ORDER — MAGNESIUM SULFATE IN D5W 1-5 GM/100ML-% IV SOLN
1.0000 g | Freq: Once | INTRAVENOUS | Status: AC
  Administered 2024-02-07: 1 g via INTRAVENOUS
  Filled 2024-02-07: qty 100

## 2024-02-07 MED ORDER — NALOXONE HCL 0.4 MG/ML IJ SOLN: 0.4000 mg | INTRAMUSCULAR | Status: DC | PRN

## 2024-02-07 MED ORDER — OXYCODONE HCL 5 MG PO TABS
10.0000 mg | ORAL_TABLET | ORAL | Status: DC | PRN
  Administered 2024-02-07 – 2024-02-09 (×7): 15 mg via ORAL
  Filled 2024-02-07 (×7): qty 3

## 2024-02-07 MED ORDER — SENNOSIDES-DOCUSATE SODIUM 8.6-50 MG PO TABS
1.0000 | ORAL_TABLET | Freq: Every day | ORAL | Status: DC
  Administered 2024-02-07 – 2024-02-08 (×2): 1 via ORAL
  Filled 2024-02-07 (×2): qty 1

## 2024-02-07 MED ORDER — OXYCODONE HCL ER 15 MG PO T12A
15.0000 mg | EXTENDED_RELEASE_TABLET | Freq: Three times a day (TID) | ORAL | Status: DC
  Administered 2024-02-07 – 2024-02-09 (×6): 15 mg via ORAL
  Filled 2024-02-07 (×6): qty 1

## 2024-02-07 NOTE — Progress Notes (Signed)
 Cancelling Brain MRI for now.  Plan to pursue MRI brain as an outpatient per oncology/Vaslow/Carsyn Taubman.

## 2024-02-07 NOTE — Consult Note (Signed)
 Radiation Oncology         [phone] ________________________________  Inpatient Consultation: CT SIM SAME DAY  Name: Regina [name] MRN: [id number]  Date of Service: 02/06/2024 DOB: 01-13-1966  XL:[id number], Meade Spencer, NP  No ref. provider found   REFERRING PHYSICIAN: No ref. provider found  DIAGNOSIS: 58 year old woman with persistent and progressive painful osseous metastatic disease in the right humerus secondary to metastatic non-small cell lung cancer.    ICD-10-CM   1. Hypokalemia  E87.6     2. Hypomagnesemia  E83.42     3. Sinus arrhythmia  I49.8       HISTORY OF PRESENT ILLNESS: Regina [name] is a 58 y.o. female seen at the request of Dr. Danella Dunn.   In summary, she was initially found to have a left frontal brain tumor in 09/2023. 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 She was treated with three fractions of preoperative SRS, followed by craniotomy on 10/07/23. Dr. Michale Age was only able to do a partial resection due to the tissue appearing to be more of an abscess than actual tumor. She is also s/p right humeral IM nailing on 10/10/23 for a pathologic fracture and was referred to Dr. Marguerita Shih to discuss treatment options. She underwent staging PET scan on 10/27/23 showing an intensely hypermetabolic lobulated 2.6 cm peripheral apical RUL pulmonary nodule and a hypermetabolic 7 cm expansile lytic destructive  bone metastasis s/p transfixation of the right mid humerus shaft and a hypermetabolic left sacral bone metastasis, CT occult. She ultimately opted to forego treatment due to "feeling good" , but developed worsening right arm pain and was hospitalized on 11/22/23. She was seen by Dr. Lurena Sally, and received two doses of radiation to the left humeral head and briefly lost to follow up thereafter, as outreach  could not get a hold of the patient. She presented back to the ED on 12/18/23 with severe body pains, specifically in her legs and right arm. She underwent left hip x-ray, which was negative. A right humerus x-ray obtained at the same time showed extensive destructive metastasis in the right humeral shaft with prominent progression since 10/01/23 and the lower interlocking screw of the medullary nail had backed out since 11/21/23, with regional periosteal reaction likely from motion.  She elected to proceed with palliative radiation  to the left sacrum at that time and completed the recommended 5 fraction course of treatment 12/26/23 with significant improvement in her pelvic pain.  She has continued on Keytruda immunotherapy under the care of Dr. Liam Redhead but has continued with persistent, progressive pain in the right upper arm.  She is also continued in routine follow-up under the care of Dr. Mark Sil for management of seizures and metastatic brain disease. Postoperative MRI brain scan from 12/14/23 showed the treated lesion to be smaller and with less regional edema with slight decrease in mass effect and reduction in left to right shift from 1 cm to 4 mm. No new lesions were identified.  We have been kindly consulted today for consideration of additional palliative radiation to the painful right humeral shaft metastasis for improved pain management.  PREVIOUS RADIATION THERAPY: Yes  12/21/23 - 12/26/23: The left sacrum was treated to 20 Gy in 5 fractions of 4 Gy each  11/24/23 - 11/25/23: Right humeral head / 13 Gy in 2 fractions   10/04/23 - 10/06/23: Left  frontal brain / 24 Gy in 3 fractions (preop SRS)  PAST MEDICAL HISTORY:  Past Medical History:  Diagnosis Date   Allergy    Anxiety    Brain mass    Cancer (HCC)    COPD (chronic obstructive pulmonary disease) (HCC)    Emphysema of lung (HCC)    Hypertension       PAST SURGICAL HISTORY: Past Surgical History:  Procedure Laterality Date    APPLICATION OF CRANIAL NAVIGATION Left 10/07/2023   Procedure: APPLICATION OF CRANIAL NAVIGATION;  Surgeon: Audie Bleacher, MD;  Location: [facility] OR;  Service: Neurosurgery;  Laterality: Left;   CRANIOTOMY Left 10/07/2023   Procedure: FRONTAL CRANIOTOMY FOR ABSCESS;  Surgeon: Audie Bleacher, MD;  Location: [facility] OR;  Service: Neurosurgery;  Laterality: Left;   dental procedure     HUMERUS IM NAIL Right 10/10/2023   Procedure: INTRAMEDULLARY (IM) NAIL HUMERAL RIGHT;  Surgeon: Wilhelmenia Harada, MD;  Location: [facility] OR;  Service: Orthopedics;  Laterality: Right;    FAMILY HISTORY:  Family History  Problem Relation Age of Onset   Leukemia Mother    Hypertension Mother    Hypertension Father    Cancer Neg Hx    Diabetes Neg Hx    Heart disease Neg Hx    Colon cancer Neg Hx    Esophageal cancer Neg Hx    Rectal cancer Neg Hx    Stomach cancer Neg Hx     SOCIAL HISTORY:  Social History   Socioeconomic History   Marital status: Widowed    Spouse name: Not on file   Number of children: 1    Years of education: 78    Highest education level: Not on file  Occupational History   Occupation: Set designer: [employer]    Comment: Nurse, mental health   Tobacco Use   Smoking status: Former    Current packs/day: 0.00    Types: Cigarettes    Quit date: 10/31/2019    Years since quitting: 4.2   Smokeless tobacco: Never  Vaping Use   Vaping status: Never Used  Substance and Sexual Activity   Alcohol  use: Yes    Alcohol /week: 6.0 standard drinks of alcohol     Types: 6 Cans of beer per week   Drug use: Yes    Frequency: 4.0 times per week    Types: Marijuana   Sexual activity: Not Currently  Other Topics Concern   Not on file  Social History Narrative   Lives with her brother.    Son lives near by, 51 yo, married.    Social Drivers of Corporate investment banker Strain: Not on file  Food Insecurity: No Food Insecurity (02/06/2024)   Hunger Vital Sign    Worried About  Running Out of Food in the Last Year: Never true    Ran Out of Food in the Last Year: Never true  Recent Concern: Food Insecurity - Food Insecurity Present (01/16/2024)   Hunger Vital Sign    Worried About Running Out of Food in the Last Year: Often true    Ran Out of Food in the Last Year: Often true  Transportation Needs: No Transportation Needs (02/06/2024)   PRAPARE - Administrator, Civil Service (Medical): No    Lack of Transportation (Non-Medical): No  Physical Activity: Not on file  Stress: Not on file  Social Connections: Not on file  Intimate Partner Violence: Not At Risk (02/06/2024)   Humiliation, Afraid, Rape, and Kick  questionnaire    Fear of Current or Ex-Partner: No    Emotionally Abused: No    Physically Abused: No    Sexually Abused: No    ALLERGIES: Pork allergy and Bee venom  MEDICATIONS:  Current Facility-Administered Medications  Medication Dose Route Frequency Provider Last Rate Last Admin   acetaminophen  (TYLENOL ) tablet 650 mg  650 mg Oral Q6H PRN Willadean Hark, MD       Or   acetaminophen  (TYLENOL ) suppository 650 mg  650 mg Rectal Q6H PRN Willadean Hark, MD       amLODipine  (NORVASC ) tablet 5 mg  5 mg Oral Daily Hongalgi, Anand D, MD   5 mg at 02/07/24 1113   bisacodyl  (DULCOLAX) EC tablet 5 mg  5 mg Oral Daily PRN Willadean Hark, MD       dexamethasone  (DECADRON ) tablet 6 mg  6 mg Oral Daily Claiborne, Claudia, MD   6 mg at 02/07/24 4696   docusate sodium  (COLACE) capsule 100 mg  100 mg Oral QHS Claiborne, Claudia, MD   100 mg at 02/06/24 2244   HYDROmorphone  (DILAUDID ) injection 1 mg  1 mg Intravenous Q2H PRN Claiborne, Claudia, MD   1 mg at 02/07/24 0941   levETIRAcetam  (KEPPRA ) tablet 500 mg  500 mg Oral BID Hongalgi, Anand D, MD       magnesium  sulfate IVPB 1 g 100 mL  1 g Intravenous Once Hongalgi, Anand D, MD       naloxone  (NARCAN ) injection 0.4 mg  0.4 mg Intravenous PRN Hongalgi, Anand D, MD       ondansetron  (ZOFRAN )  injection 4 mg  4 mg Intravenous Once Yao, David Hsienta, MD       ondansetron  (ZOFRAN ) tablet 4 mg  4 mg Oral Q6H PRN Willadean Hark, MD       Or   ondansetron  (ZOFRAN ) injection 4 mg  4 mg Intravenous Q6H PRN Willadean Hark, MD       oxyCODONE  (Oxy IR/ROXICODONE ) immediate release tablet 10-15 mg  10-15 mg Oral Q4H PRN McIlquham, Julia B, NP       oxyCODONE  (OXYCONTIN ) 12 hr tablet 15 mg  15 mg Oral Q8H McIlquham, Julia B, NP   15 mg at 02/07/24 1409   pneumococcal 20-valent conjugate vaccine (PREVNAR 20) injection 0.5 mL  0.5 mL Intramuscular Tomorrow-1000 Willadean Hark, MD       potassium chloride  SA (KLOR-CON  M) CR tablet 40 mEq  40 mEq Oral Once Hongalgi, Anand D, MD       senna-docusate (Senokot-S) tablet 1 tablet  1 tablet Oral QHS McIlquham, Julia B, NP        REVIEW OF SYSTEMS:  On review of systems, the patient reports that she is doing fair overall with her only complaint being severe pain in the right upper arm. She denies any chest pain, shortness of breath, cough, fevers, chills, night sweats, unintended weight changes. She denies any bowel or bladder disturbances, and denies abdominal pain, nausea or vomiting. She denies any new musculoskeletal or joint aches or pains. A complete review of systems is obtained and is otherwise negative.    PHYSICAL EXAM:  Wt Readings from Last 3 Encounters:  02/06/24 119 lb 0.8 oz (54 kg)  02/01/24 119 lb (54 kg)  01/28/24 120 lb (54.4 kg)   Temp Readings from Last 3 Encounters:  02/07/24 98.4 F (36.9 C) (Oral)  02/01/24 98.2 F (36.8 C) (Oral)  01/28/24 98 F (36.7 C) (Oral)   BP Readings from Last  3 Encounters:  02/07/24 (!) 157/93  02/01/24 (!) 162/97  01/28/24 139/78   Pulse Readings from Last 3 Encounters:  02/07/24 88  02/01/24 82  01/28/24 71   Pain Assessment Pain Score: 9 /10  In general this is a cachectic appearing African American woman in no acute distress. She's alert and oriented x4 and appropriate  throughout the examination. Cardiopulmonary assessment is negative for acute distress and she exhibits normal effort.    KPS = 50  100 - Normal; no complaints; no evidence of disease. 90   - Able to carry on normal activity; minor signs or symptoms of disease. 80   - Normal activity with effort; some signs or symptoms of disease. 74   - Cares for self; unable to carry on normal activity or to do active work. 60   - Requires occasional assistance, but is able to care for most of his personal needs. 50   - Requires considerable assistance and frequent medical care. 40   - Disabled; requires special care and assistance. 30   - Severely disabled; hospital admission is indicated although death not imminent. 20   - Very sick; hospital admission necessary; active supportive treatment necessary. 10   - Moribund; fatal processes progressing rapidly. 0     - Dead  Karnofsky DA, Abelmann WH, Craver LS and Burchenal [facility] [phone]) The use of the nitrogen mustards in the palliative treatment of carcinoma: with particular reference to bronchogenic carcinoma Cancer 1 634-56  LABORATORY DATA:  Lab Results  Component Value Date   WBC 7.2 02/07/2024   HGB 12.3 02/07/2024   HCT 37.3 02/07/2024   MCV 86.7 02/07/2024   PLT 438 (H) 02/07/2024   Lab Results  Component Value Date   NA 134 (L) 02/07/2024   K 3.5 02/07/2024   CL 103 02/07/2024   CO2 22 02/07/2024   Lab Results  Component Value Date   ALT 17 02/06/2024   AST 21 02/06/2024   ALKPHOS 130 (H) 02/06/2024   BILITOT 0.3 02/06/2024     RADIOGRAPHY: DG Humerus Left Result Date: 02/06/2024 CLINICAL DATA:  Intractable pain. Non-small cell lung cancer with metastatic disease. EXAM: LEFT HUMERUS - 2+ VIEW COMPARISON:  None Available. FINDINGS: Slight cortical thinning in the lateral aspect of the mid humeral shaft which may represent an underlying lucent lesion. No pathologic fracture. Shoulder and elbow alignment are maintained. Unremarkable soft  tissues. IMPRESSION: Slight cortical thinning in the lateral aspect of the mid humeral shaft which may represent an underlying lucent lesion. No pathologic fracture. Electronically Signed   By: Chadwick Colonel M.D.   On: 02/06/2024 20:50   DG Humerus Right Result Date: 02/06/2024 CLINICAL DATA:  Intractable pain. Metastatic non-small cell lung cancer. EXAM: RIGHT HUMERUS - 2+ VIEW COMPARISON:  Radiograph 12/19/2023 FINDINGS: Humeral intramedullary nail with proximal and distal locking screw fixation. The distal locking screw extends into the soft tissues by 13 mm, unchanged from most recent prior exam. There is slight increase in adjacent periosteal reaction. Previous lucency in the mid humeral shaft has diminished with increasing bone stock. There is some periosteal reaction about the proximal aspect of the lesion. IMPRESSION: 1. Humeral intramedullary nail with proximal and distal locking screw fixation. The distal locking screw extends into the soft tissues by 13 mm, unchanged from most recent prior exam. There is increase in periosteal reaction which may be due to motion. 2. Previous lucent lesion in the mid humeral shaft has diminished with increasing bone stock. There is  some periosteal reaction about the proximal aspect of the lesion. Electronically Signed   By: Chadwick Colonel M.D.   On: 02/06/2024 20:48   DG Chest 2 View Result Date: 02/06/2024 CLINICAL DATA:  Shortness of breath with chest pain EXAM: CHEST - 2 VIEW COMPARISON:  Chest x-ray 01/31/2024.  CT of the chest 01/28/2024. FINDINGS: TAVR attic changes in the lung bases appear unchanged. Nodular density in the right lung apex is unchanged. There is no new focal lung infiltrate, pleural effusion or pneumothorax. The cardiomediastinal silhouette is within normal limits. IMPRESSION: 1. No active cardiopulmonary disease. 2. Stable nodular density in the right lung apex. Electronically Signed   By: Tyron Gallon M.D.   On: 02/06/2024 17:50   DG  Chest Portable 1 View Result Date: 02/01/2024 CLINICAL DATA:  Chest pain and shortness of breath. EXAM: PORTABLE CHEST 1 VIEW COMPARISON:  Jan 27, 2024 FINDINGS: The heart size and mediastinal contours are within normal limits. Mild to moderate severity calcification of the aortic arch is seen. There is evidence of emphysematous lung disease, chronic appearing increased interstitial lung markings and bilateral areas of pulmonary fibrosis. There is stable biapical pleural thickening, scarring and/or atelectasis with hazy opacification along the lateral aspect of the right apex in the area of the patient's known right apical mass. No pleural effusion or pneumothorax is identified. A radiopaque intramedullary rod and proximal fixation screws are seen within the right humerus. An ill-defined lytic area is also seen along the medial aspect of the mid right humeral shaft. IMPRESSION: COPD and findings which likely correspond to the patient's known lateral right apical mass. Electronically Signed   By: Virgle Grime M.D.   On: 02/01/2024 00:12   CT Angio Chest PE W and/or Wo Contrast Result Date: 01/28/2024 CLINICAL DATA:  Pulmonary embolism (PE) suspected, high prob. Chest pain EXAM: CT ANGIOGRAPHY CHEST WITH CONTRAST TECHNIQUE: Multidetector CT imaging of the chest was performed using the standard protocol during bolus administration of intravenous contrast. Multiplanar CT image reconstructions and MIPs were obtained to evaluate the vascular anatomy. RADIATION DOSE REDUCTION: This exam was performed according to the departmental dose-optimization program which includes automated exposure control, adjustment of the mA and/or kV according to patient size and/or use of iterative reconstruction technique. CONTRAST:  75mL OMNIPAQUE  IOHEXOL  350 MG/ML SOLN COMPARISON:  CT angio chest 12/12/2023, PET CT 10/27/2023 FINDINGS: Cardiovascular: Satisfactory opacification of the pulmonary arteries to the segmental level. No  evidence of pulmonary embolism. The main pulmonary artery is normal in caliber. Normal heart size. No significant pericardial effusion. The thoracic aorta is normal in caliber. No atherosclerotic plaque of the thoracic aorta. No coronary artery calcifications. Mediastinum/Nodes: Redemonstration of right hilar lymphadenopathy with as an example a 1.1 cm lymph node (7:162). Redemonstration of right axillary lymphadenopathy. As an example a 1.3 cm lymph node (5:30). No enlarged mediastinal, left hilar, or axillary lymph nodes. Thyroid  gland, trachea, and esophagus demonstrate no significant findings. Lungs/Pleura: Paraseptal and centrilobular moderate emphysema. Bibasilar reticulations and lingular and lower lobe honeycombing. Interval decrease in size of a 2.3 x 1.6 cm (from 2.7 x 1.9 cm) right apical nodular opacity. Persistent biapical pleural/pulmonary scarring. No pulmonary mass. Trace right apical loculated pleural effusion. No pneumothorax. Upper Abdomen: No acute abnormality. Musculoskeletal: No chest wall abnormality. Interval increase in size of a manubrial lytic lesion with associated 3.4 x 2.1 cm soft tissue density (9:72, 5:32). No acute displaced fracture. Multilevel degenerative changes of the spine. Review of the MIP images confirms the above  findings. IMPRESSION: 1. No pulmonary embolus. 2. Interval decrease in size of a 2.3 x 1.6 cm (from 2.7 x 1.9 cm) right apical nodular opacity consistent with known malignancy. 3. Persistent right hilar and axillary lymphadenopathy. 4. Interval increase in size of a metastatic osseous manubrial lytic lesion with associated 3.4 x 2.1 cm soft tissue density. 5. Trace right apical loculated pleural effusion. 6. Pulmonary fibrosis/interstitial lung disease as well as emphysema. 7. Aortic Atherosclerosis (ICD10-I70.0) and Emphysema (ICD10-J43.9). Electronically Signed   By: Morgane  Naveau M.D.   On: 01/28/2024 01:38   DG Chest 2 View Result Date: 01/27/2024 CLINICAL  DATA:  Chest pain EXAM: CHEST - 2 VIEW COMPARISON:  Radiograph and CT 12/12/2023 FINDINGS: Stable cardiomediastinal silhouette. Aortic atherosclerotic calcification. Chronic interstitial lung disease with biapical pleuroparenchymal scarring, bronchitic changes, and reticular opacities in the lower lungs. The known right apical mass is redemonstrated and better evaluated on CT. No pleural effusion or pneumothorax. No displaced rib fracture. Lytic lesion in the right mid humerus. IMPRESSION: Chronic interstitial lung disease similar to 12/12/2023. Known right apical mass is redemonstrated. Lytic lesion in the right mid humerus. Electronically Signed   By: Rozell Cornet M.D.   On: 01/27/2024 23:03      IMPRESSION/PLAN: 1. 58 y.o. woman with persistent painful osseous metastatic disease in the right humerus secondary to metastatic non-small cell lung cancer.  Today, we talked to the patient about the findings and workup thus far. We discussed the natural history of metastatic lung cancer and general treatment, highlighting the role of palliative radiotherapy in the management. We discussed the available radiation techniques, and focused on the details and logistics of delivery. We reviewed the anticipated acute and late sequelae associated with radiation in this setting. The patient was encouraged to ask questions that were answered to her stated satisfaction.  At the conclusion of our conversation, she is in agreement to proceed with the recommended 2-week course of daily palliative radiotherapy to the painful metastatic disease in the right humeral shaft.  She has freely signed written consent to proceed today in the office and a copy of this document will be placed in her medical record.  She will proceed with CT simulation/treatment planning this afternoon at 2:30 PM, in anticipation of beginning her treatments on Wednesday, 02/08/2024.  We enjoyed meeting with her again today and look forward to continuing  to participate in her care.   We personally spent 70 minutes in this encounter including chart review, reviewing radiological studies, meeting face-to-face with the patient, entering orders and completing documentation.    Arta Bihari, PA-C    Kenith Payer, MD  [facility]  Radiation Oncology Direct Dial: [phone]  Fax: [phone] [email or web address]  Skype  LinkedIn

## 2024-02-07 NOTE — Progress Notes (Addendum)
 PROGRESS NOTE   Regina [name]  ZOX:[id number]    DOB: 12-Aug-1966    DOA: 02/06/2024  PCP: Marius Siemens, NP   I have briefly reviewed patients previous medical records in [facility].   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  Admitted for intractable cancer-related pain.  Consulted radiation oncology, plan on initiating palliative XRT to right upper extremity.   Assessment & Plan:   Intractable cancer related pain/Stage IV non-small cell lung cancer (metastasis to right arm bone, lymph nodes, & brain metastasis) DG right humerus: Humeral intramedullary nail with proximal and distal locking screw fixation.  The distal locking screw extends into the soft tissue by 13 mm, unchanged from most recent prior exam.   DG left humerus: Slight cortical thinning in the lateral aspect of the mid humeral shaft which may represent an underlying lucent lesion.  No pathological fracture Consulted radiation oncology, communicated with Dr. Lorri Rota, plan on XRT planning this afternoon followed by initiation of treatment Multimodality pain control.  Appreciate palliative care input.  They have initiated OxyContin  15 mg every 8 hours, Oxy IR 10-15 mg every 4 hours as needed for breakthrough pain, Dilaudid  1 mg every 2 hours as needed.  Started on dexamethasone  6 mg p.o. daily (patient reports that she ran out,  not sure if she is on this or how much, awaiting pharmacy to complete med rec-May need to review with neuro-oncology).  Continue bowel regimen to avoid OIC.  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  However frustrated because she cannot express her self at times. As per Dr. Lorri Rota evaluation, agrees that she has some expressive aphasia but she seems sharper and more eloquent than her baseline.  Both he and Dr. Mark Sil agreed with outpatient brain MRI. Stable  Seizures Confirmed with patient that she takes Keppra , continue 500 Mg twice daily Seizure precautions  Hypokalemia Improved after replacement.  Will give additional 40 mEq today and follow-up BMP in a.m.  Hypomagnesemia Replaced  Facial swelling Noted by admitting physician but I did not appreciated during my exam. Monitor.  Essential hypertension Continue amlodipine  5 Mg daily.  Confirm that she takes amlodipine .  Rest of her home med rec is yet to be completed by pharmacy.?  Metoprolol .   Body mass index is 19.21 kg/m.   DVT prophylaxis: SCDs Start: 02/06/24 1931     Code Status: Full Code:  Family Communication: None at bedside Disposition:  Status is: Inpatient Remains inpatient appropriate because: Intractable pain requiring multimodality pain management, initiation of radiation therapy.     Consultants:   Radiation oncology Communicated with Dr. Mark Sil, Neuro-Oncology and Dr. Marguerita Shih via secure chat  Procedures:     Subjective:  Seen this morning.  Reports feeling better.  Still with mostly right upper arm pain and swelling.  Indicates speech impediment is chronic and unchanged since brain surgery.  Objective:   Vitals:   02/07/24 0613 02/07/24 1008 02/07/24 1113 02/07/24 1337  BP: (!) (P) 157/87 (!) 162/89 (!) 162/89 (!) 157/93  Pulse: (P) 84 92  88  Resp: (P) 20 16  16   Temp: (P) 98.7 F  (37.1 C) 98.7 F (37.1 C)  98.4 F (36.9 C)  TempSrc: (P) Oral Oral  Oral  SpO2: (P) 98% 95%  100%  Weight:      Height:        General exam: Young female, moderately built and nourished sitting up in bed without distress. Respiratory system: Clear to auscultation. Respiratory effort normal. Cardiovascular system: S1 & S2 heard, RRR. No JVD, murmurs, rubs, gallops or clicks. No pedal edema. Gastrointestinal system: Abdomen is nondistended, soft and nontender. No organomegaly or masses felt. Normal bowel sounds heard. Central nervous system: Alert and oriented.  Speech impediment/likely expressive aphasia.  Patient gets frustrated due to inability to say what she is thinking.  No focal deficits otherwise. Extremities: Symmetric 5 x 5 power.  Right upper arm with swelling, warmth, hyperpigmentation.  Limited movements of right upper extremity due to pain. Skin: No rashes, lesions or ulcers Psychiatry: Judgement and insight appear normal. Mood & affect appropriate.     Data Reviewed:   I have personally reviewed following labs and imaging studies   CBC: Recent Labs  Lab 02/06/24 1617 02/07/24 0537  WBC 8.4 7.2  HGB 12.6 12.3  HCT 37.7 37.3  MCV 84.7 86.7  PLT 516* 438*    Basic Metabolic Panel: Recent Labs  Lab 02/06/24 1617 02/07/24 0537  NA 134* 134*  K 2.6* 3.5  CL 99 103  CO2 22 22  GLUCOSE 105* 139*  BUN 8 5*  CREATININE 0.54 0.41*  CALCIUM  9.9 9.3  MG 1.6* 1.7    Liver Function Tests: Recent Labs  Lab 02/06/24 1617  AST 21  ALT 17  ALKPHOS 130*  BILITOT 0.3  PROT 6.9  ALBUMIN 3.5    CBG: No results for input(s): "GLUCAP" in the last 168 hours.  Microbiology Studies:  No results found for this or any previous visit (from the past 240 hours).  Radiology Studies:  DG Humerus Left Result Date: 02/06/2024 CLINICAL DATA:  Intractable pain. Non-small cell lung cancer with metastatic disease. EXAM: LEFT HUMERUS - 2+ VIEW COMPARISON:  None Available.  FINDINGS: Slight cortical thinning in the lateral aspect of the mid humeral shaft which may represent an underlying lucent lesion. No pathologic fracture. Shoulder and elbow alignment are maintained. Unremarkable soft tissues. IMPRESSION: Slight cortical thinning in the lateral aspect of the mid humeral shaft which may represent an underlying lucent lesion. No pathologic fracture. Electronically Signed   By: Chadwick Colonel M.D.   On: 02/06/2024 20:50   DG Humerus Right Result Date: 02/06/2024 CLINICAL DATA:  Intractable pain. Metastatic non-small cell lung cancer. EXAM: RIGHT HUMERUS - 2+ VIEW COMPARISON:  Radiograph 12/19/2023 FINDINGS: Humeral intramedullary nail with proximal and distal locking screw fixation. The distal locking screw extends into the soft tissues by 13 mm, unchanged from most recent prior exam. There is slight increase in adjacent periosteal reaction. Previous lucency in the mid humeral shaft has diminished with increasing bone stock. There is some periosteal reaction about the proximal aspect of the lesion. IMPRESSION: 1. Humeral intramedullary nail with proximal and distal locking screw fixation. The distal locking screw extends into the soft tissues by 13 mm, unchanged from most recent prior exam. There is increase in periosteal reaction which may  be due to motion. 2. Previous lucent lesion in the mid humeral shaft has diminished with increasing bone stock. There is some periosteal reaction about the proximal aspect of the lesion. Electronically Signed   By: Chadwick Colonel M.D.   On: 02/06/2024 20:48   DG Chest 2 View Result Date: 02/06/2024 CLINICAL DATA:  Shortness of breath with chest pain EXAM: CHEST - 2 VIEW COMPARISON:  Chest x-ray 01/31/2024.  CT of the chest 01/28/2024. FINDINGS: TAVR attic changes in the lung bases appear unchanged. Nodular density in the right lung apex is unchanged. There is no new focal lung infiltrate, pleural effusion or pneumothorax. The  cardiomediastinal silhouette is within normal limits. IMPRESSION: 1. No active cardiopulmonary disease. 2. Stable nodular density in the right lung apex. Electronically Signed   By: Tyron Gallon M.D.   On: 02/06/2024 17:50    Scheduled Meds:    amLODipine   5 mg Oral Daily   dexamethasone   6 mg Oral Daily   docusate sodium   100 mg Oral QHS   levETIRAcetam   500 mg Oral BID   ondansetron  (ZOFRAN ) IV  4 mg Intravenous Once   oxyCODONE   15 mg Oral Q8H   pneumococcal 20-valent conjugate vaccine  0.5 mL Intramuscular Tomorrow-1000   potassium chloride  SA  40 mEq Oral Once   senna-docusate  1 tablet Oral QHS    Continuous Infusions:    magnesium  sulfate bolus IVPB       LOS: 1 day     Aubrey Blas, MD,  FACP, [facility], [facility], [facility]   [facility] [place]      To contact the attending provider between 7A-7P or the covering provider during after hours 7P-7A, please log into the web site [email or web address] and access using universal [place] password for that web site. If you do not have the password, please call the hospital operator.  02/07/2024, 2:22 PM

## 2024-02-07 NOTE — Progress Notes (Signed)
  Radiation Oncology         [phone]) [phone] ________________________________  Name: Regina [name] MRN: [id number]  Date: 02/07/2024  DOB: 1966/04/19  INPATIENT   SIMULATION AND TREATMENT PLANNING NOTE    ICD-10-CM   1. Metastasis to bone [facility])  C79.51       DIAGNOSIS:  58 yo woman with a painful right humerus bone metastasis from right upper lung cancer, previously irradiated  NARRATIVE:  The patient was brought to the CT Simulation planning suite.  Identity was confirmed.  All relevant records and images related to the planned course of therapy were reviewed.  The patient freely provided informed written consent to proceed with treatment after reviewing the details related to the planned course of therapy. The consent form was witnessed and verified by the simulation staff.  Then, the patient was set-up in a stable reproducible  supine position for radiation therapy.  CT images were obtained.  Surface markings were placed.  The CT images were loaded into the planning software.  Then the target and avoidance structures were contoured.  Treatment planning then occurred.  The radiation prescription was entered and confirmed.  Then, I designed and supervised the construction of multiple medically necessary complex treatment devices including body positioner and MLCs.  I have requested : 3D Simulation  I have requested a DVH of the following structures: target, lungs, heart, brachial plexus and othere.   SPECIAL TREATMENT PROCEDURE:  The planned course of therapy using radiation constitutes a special treatment procedure. Special care is required in the management of this patient for the following reasons. This treatment constitutes a Special Treatment Procedure for the following reason: [ Retreatment in a previously radiated area requiring careful monitoring of increased risk of toxicity due to overlap of previous treatment..  The special nature of the planned course of radiotherapy will require  increased physician supervision and oversight to ensure patient's safety with optimal treatment outcomes.  This will require extended time and effort from me.  PLAN:  The patient will receive 30 Gy in 10 fractions, supplementing a previous abbreviated course of radiation.  ________________________________  Trilby Fujisawa Lorri Rota, M.D.

## 2024-02-07 NOTE — TOC Initial Note (Signed)
 Transition of Care [facility]) - Initial/Assessment Note    Patient Details  Name: Regina [name] MRN: [id number] Date of Birth: 05-Mar-1966  Transition of Care [facility]) CM/SW Contact:    Loreda Rodriguez, RN Phone Number:[phone]  02/07/2024, 4:00 PM  Clinical Narrative:                 [facility] acknowledges consult for "living situation." CM at bedside for assessment. Patient is alert to self and place but does have rambling conversation between answering assessment questions. Patient states that she is from home with brother and wants to discharge because she helps take care of her brother. Patient denies that she understands why the CM is asking about living situation. Patient states that she has no issues with her living situation and does not answer further questioning for CM attempting to assess the situation. Patient is currently continues to ramble about how she can not stay in the hospital for radiation. CM will attempt to reach out to family for clarification.   Expected Discharge Plan: Home/Self Care Barriers to Discharge: Continued Medical Work up   Patient Goals and CMS Choice Patient states their goals for this hospitalization and ongoing recovery are:: Wants to go home where she takes care of her brother. CMS Medicare.gov Compare Post Acute Care list provided to::  (n/a) Choice offered to / list presented to : NA [place] ownership interest in [facility].provided to::  (n/a)    Expected Discharge Plan and Services In-house Referral: NA Discharge Planning Services: CM Consult, NA Post Acute Care Choice: NA Living arrangements for the past 2 months: Apartment                 DME Arranged: N/A DME Agency: NA       HH Arranged: NA HH Agency: NA        Prior Living Arrangements/Services Living arrangements for the past 2 months: Apartment Lives with:: Siblings Patient language and need for interpreter reviewed:: No Do you feel safe going back to the place where  you live?: Yes      Need for Family Participation in Patient Care: Yes (Comment) Care giver support system in place?: Yes (comment) Current home services:  (n/a) Criminal Activity/Legal Involvement Pertinent to Current Situation/Hospitalization: No - Comment as needed  Activities of Daily Living   ADL Screening (condition at time of admission) Independently performs ADLs?: Yes (appropriate for developmental age) Is the patient deaf or have difficulty hearing?: No Does the patient have difficulty seeing, even when wearing glasses/contacts?: No Does the patient have difficulty concentrating, remembering, or making decisions?: Yes  Permission Sought/Granted Permission sought to share information with : Family Supports Permission granted to share information with : No              Emotional Assessment Appearance:: Appears stated age Attitude/Demeanor/Rapport: Inconsistent Affect (typically observed): Overwhelmed Orientation: : Oriented to Self, Oriented to Place Alcohol  / Substance Use: Not Applicable Psych Involvement: No (comment)  Admission diagnosis:  Hypokalemia [E87.6] Hypomagnesemia [E83.42] Sinus arrhythmia [I49.8] Intractable pain [R52] Patient Active Problem List   Diagnosis Date Noted   Intractable pain 02/06/2024   Cancer-related breakthrough pain 12/19/2023   High risk medication use 12/19/2023   Non-small cell lung cancer (HCC) 12/19/2023   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   Medication management 12/19/2023   Cancer associated pain 12/19/2023   Acute metabolic encephalopathy 12/13/2023   Expressive aphasia 12/13/2023   SIRS (systemic inflammatory response syndrome) (HCC) 12/13/2023   Lactic  acidosis 12/13/2023   History of COVID-19 12/13/2023   Metabolic acidosis with increased anion gap and accumulation of organic acids 12/13/2023   Pathologic fracture 11/22/2023   Hypokalemia 11/22/2023   Closed fracture of shaft of right humerus 10/10/2023    Pressure injury of skin 10/10/2023   Cerebral intracranial abscess 10/07/2023   Palliative care encounter 10/04/2023   Pathological fracture of right humerus with delayed healing 10/04/2023   Lung cancer metastatic to brain (HCC) 10/04/2023   Vasogenic brain edema (HCC) 10/04/2023   Counseling and coordination of care 10/04/2023   Protein-calorie malnutrition, severe 10/04/2023   Metastasis to brain [facility]) 10/03/2023   Metastasis to bone (HCC) 10/03/2023   Cerebral edema (HCC) 10/01/2023   Primary adenocarcinoma of upper lobe of right lung (HCC) 09/22/2023   Pathological fracture in neoplastic disease, right humerus, initial encounter for fracture 09/19/2023   Neoplasm causing mass effect and brain compression on adjacent structures (HCC) 09/18/2023   COPD (chronic obstructive pulmonary disease) (HCC)    Anxiety    Hyponatremia 04/16/2018   Depression 04/16/2018   Hypertensive urgency    HTN (hypertension) 01/13/2016   Right anterior shoulder pain 07/18/2014   Postconcussion syndrome 05/28/2014   PCP:  Marius Siemens, NP Pharmacy:   [facility] [address], [address] [place] [address] Phone: [phone] Fax: [phone]     Social Drivers of Health (SDOH) Social History: SDOH Screenings   Food Insecurity: No Food Insecurity (02/06/2024)  Recent Concern: Food Insecurity - Food Insecurity Present (01/16/2024)  Housing: High Risk (02/06/2024)  Transportation Needs: No Transportation Needs (02/06/2024)  Utilities: Not At Risk (02/06/2024)  Recent Concern: Utilities - At Risk (12/19/2023)  Depression (PHQ2-9): Low Risk  (01/16/2024)  Tobacco Use: Medium Risk (02/06/2024)   SDOH Interventions:     Readmission Risk Interventions    02/07/2024    3:53 PM 11/24/2023    4:23 PM 10/05/2023    9:51 AM  Readmission Risk Prevention Plan  Transportation Screening Complete Complete Complete  HRI or Home Care Consult   Complete   Social Work Consult for Recovery Care Planning/Counseling   Complete  Palliative Care Screening   Complete  Medication Review [facility]) Referral to Pharmacy Complete Complete  PCP or Specialist appointment within 3-5 days of discharge Complete Complete   HRI or Home Care Consult Complete Complete   SW Recovery Care/Counseling Consult Complete Complete   Palliative Care Screening Complete Complete   Skilled Nursing Facility Not Applicable Not Applicable

## 2024-02-07 NOTE — Consult Note (Signed)
 Palliative Care Consult Note                                  Date: 02/07/2024   Patient Name: Regina [name]  DOB: [birth year]  MRN: [id number]  Age / Sex: 58 y.o., female  PCP: Regina Siemens, NP Referring Physician: Casey Clay, MD  Reason for Consultation: Pain control  HPI/Patient Profile: 58 y.o. 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  Palliative Medicine has been consulted for help with pain management   Past Medical History:  Diagnosis Date   Allergy    Anxiety    Brain mass    Cancer (HCC)    COPD (chronic obstructive pulmonary disease) (HCC)    Emphysema of lung (HCC)    Hypertension      Clinical Assessment and Goals of Care:   Extensive chart review has been completed including labs, vital signs, imaging, progress/consult notes, orders, medications and available advance directive documents.  Review of MAR: In the past 24 hours, patient has received 5 mg IV dilaudid  and 15 mg PO oxycodone  IR.   [place] PMP was also reviewed. The following Rx were filled since discharge from the hospital on 4/22: Oxycontin  15 mg #20 (written 7/82, filled 5/2) Oxycodone  IR 10 mg #20 (written 4/22, filled 4/22) Oxycodone  IR 5 mg #15 (written 5/28, filled 5/28)  Patient is well-known to PMT from previous hospitalizations. She was referred to the outpatient Palliative Care clinic at the [facility] for ongoing pain management, but per their note on 5/7, she did not show for multiple appointments and the referral was closed.  Discussion: I met with patient at bedside to discuss goals of  care and symptom management. Created space and opportunity for patient to express thoughts and feelings regarding current medical situation. Values and goals of care were attempted to be elicited.  Patient reports pain in both arms, right worse than left, that radiates into the shoulders. She reports the pain is severe 10/10, and minimally relieved by current medications.   Discussed with patient the information from [facility] PMP as per above. Patient is argumentative about the dates and details of the prescriptions. She expresses frustration that she ran out of pain medication. Education provided that the hospital cannot provide more than 5-7 days of opioids at discharge, and that she must follow-up with the palliative care clinic for refills.   Offered to restart patient on her previous regimen from last admission. She is agreeable, acknowledging that the previous regimen was effective at relieving her pain.  A brief discussion was had today regarding advanced directives. We reviewed that her niece/Regina is her documented health care agent. We discussed code status and scopes of care. Patient has continued to elect full code and full scope of care.  Discussed the importance of continued conversation with the medical team regarding overall plan of care and treatment options.  Questions and concerns addressed. Plan for follow-up tomorrow.    Review of Systems  Musculoskeletal:        Bilateral arm pain    Objective:   Primary Diagnoses: Present on Admission:  Intractable pain  Pathological fracture in neoplastic disease, right humerus, initial encounter for fracture  Metastasis to brain [facility])  Lung cancer metastatic to brain [facility])   Physical Exam Vitals reviewed.  Constitutional:      General: She is not in acute distress.    Comments: Chronically ill-appearing  Pulmonary:     Effort: Pulmonary effort is normal.  Neurological:     Mental Status: She is alert and oriented to person,  place, and time.     Comments: Intermittent garbled speech  Psychiatric:     Comments: frustrated    Palliative Assessment/Data: PPS 60%     Assessment & Plan:   SUMMARY OF RECOMMENDATIONS   Continue full scope care PMT will continue to follow Patient will need a new referral to outpatient Palliative Care clinic at the cancer center  Primary Decision Maker: PATIENT  Existing Vynca/ACP Documentation: HCPOA document designating her niece Regina [name] as her health care agent.  Code Status/Advance Care Planning: Full code  Symptom Management:  OxyContin  15 mg every 8 hours Oxycodone  IR 10 to 15 mg every 4 hours as needed for breakthrough pain Continue Dilaudid  1 mg every 2 hours as needed for severe breakthrough pain Discontinue scheduled oxycodone  IR Senokot-S 1 tablet at bedtime  Prognosis:  Unable to determine  Discharge Planning:  Home   Thank you for allowing us  to participate in the care of Regina [name]   Time Total: 80 minutes  Detailed review of medical records (labs, imaging, vital signs), medically appropriate exam, discussed with treatment team, counseling and education to patient, family, & staff, documenting clinical information, medication management, coordination of care.   Signed by: Regina Scotland, NP Palliative Medicine Team  Team Phone # [phone]  For individual providers, please see AMION

## 2024-02-07 NOTE — Plan of Care (Signed)

## 2024-02-08 ENCOUNTER — Ambulatory Visit
Admit: 2024-02-08 | Discharge: 2024-02-08 | Disposition: A | Discharge status: Home / Self Care | Payer: MEDICAID | Attending: Radiation Oncology | Admitting: Radiation Oncology

## 2024-02-08 ENCOUNTER — Other Ambulatory Visit: Payer: Self-pay

## 2024-02-08 DIAGNOSIS — R52 Pain, unspecified: Secondary | ICD-10-CM | POA: Diagnosis not present

## 2024-02-08 DIAGNOSIS — M84521A Pathological fracture in neoplastic disease, right humerus, initial encounter for fracture: Secondary | ICD-10-CM | POA: Diagnosis not present

## 2024-02-08 DIAGNOSIS — C3490 Malignant neoplasm of unspecified part of unspecified bronchus or lung: Secondary | ICD-10-CM | POA: Diagnosis not present

## 2024-02-08 DIAGNOSIS — C7931 Secondary malignant neoplasm of brain: Secondary | ICD-10-CM | POA: Diagnosis not present

## 2024-02-08 LAB — CBC
HCT: 34.6 % — ABNORMAL LOW (ref 36.0–46.0)
Hemoglobin: 11.5 g/dL — ABNORMAL LOW (ref 12.0–15.0)
MCH: 28.5 pg (ref 26.0–34.0)
MCHC: 33.2 g/dL (ref 30.0–36.0)
MCV: 85.6 fL (ref 80.0–100.0)
Platelets: 480 10*3/uL — ABNORMAL HIGH (ref 150–400)
RBC: 4.04 MIL/uL (ref 3.87–5.11)
RDW: 17.8 % — ABNORMAL HIGH (ref 11.5–15.5)
WBC: 11.1 10*3/uL — ABNORMAL HIGH (ref 4.0–10.5)
nRBC: 0 % (ref 0.0–0.2)

## 2024-02-08 LAB — RAD ONC ARIA SESSION SUMMARY
Course Elapsed Days: 0
Plan Fractions Treated to Date: 1
Plan Prescribed Dose Per Fraction: 3 Gy
Plan Total Fractions Prescribed: 10
Plan Total Prescribed Dose: 30 Gy
Reference Point Dosage Given to Date: 3 Gy
Reference Point Session Dosage Given: 3 Gy
Session Number: 1

## 2024-02-08 LAB — BASIC METABOLIC PANEL WITH GFR
Anion gap: 10 (ref 5–15)
BUN: 9 mg/dL (ref 6–20)
CO2: 23 mmol/L (ref 22–32)
Calcium: 9.4 mg/dL (ref 8.9–10.3)
Chloride: 101 mmol/L (ref 98–111)
Creatinine, Ser: 0.52 mg/dL (ref 0.44–1.00)
GFR, Estimated: >60 mL/min (ref >60)
Glucose, Bld: 101 mg/dL — ABNORMAL HIGH (ref 70–99)
Potassium: 3.5 mmol/L (ref 3.5–5.1)
Sodium: 134 mmol/L — ABNORMAL LOW (ref 135–145)

## 2024-02-08 LAB — MAGNESIUM: Magnesium: 1.7 mg/dL (ref 1.7–2.4)

## 2024-02-08 NOTE — Progress Notes (Signed)
 Palliative Medicine Progress Note   Patient Name: Regina [name]       Date: 02/08/2024 DOB: 12-04-1965  Age: 58 y.o. MRN#: [id number] Attending Physician: Sedalia Dacosta, MD Primary Care Physician: Marius Siemens, NP Admit Date: 02/06/2024  Reason for Consultation/Follow-up: {Reason for Consult:23484}  HPI/Patient Profile:  58 y.o. 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   Palliative Medicine has been consulted for help with pain management  Subjective: Per MAR, patient has not had IV dilaudid  since 0941 yesterday morning.   Objective:  Physical Exam          Vital Signs: BP (!) 150/94 (BP Location: Left Arm)   Pulse (!) 103   Temp 98 F (36.7 C) (Oral)   Resp 16   Ht 5\' 6"  (1.676 m)   Wt 54 kg   LMP 06/06/2010   SpO2 96%   BMI 19.21 kg/m  SpO2: SpO2: 96 % O2 Device: O2 Device: Room Air O2 Flow Rate:    Intake/output summary:  Intake/Output Summary (Last 24 hours) at 02/08/2024 1441 Last data filed at 02/08/2024 1339 Gross per 24 hour  Intake 611.04 ml  Output --  Net 611.04 ml    LBM: Last BM Date :  (02/06/24)     Palliative Assessment/Data: ***     Palliative Medicine Assessment & Plan   Assessment: Principal Problem:   Intractable pain Active Problems:   Pathological fracture in neoplastic disease, right humerus, initial encounter for fracture   Metastasis to brain [facility])   Lung cancer metastatic to brain [facility])     Recommendations/Plan: ***  Goals of Care and Additional Recommendations: Limitations on Scope of Treatment: {Recommended Scope and Preferences:21019}  Code Status:   Prognosis:  {Palliative Care Prognosis:23504}  Discharge Planning: {Palliative dispostion:23505}  Care plan was discussed with ***  Thank you for allowing the Palliative Medicine Team to assist in the care of this patient.   ***   Wynetta Heckle, NP   Please contact Palliative Medicine Team phone at [phone] for  questions and concerns.  For individual providers, please see AMION.

## 2024-02-08 NOTE — Progress Notes (Signed)
 Regina [name]   DOB:Sep 25, 1965   LK#:[id number]      ASSESSMENT & PLAN:  Regina [name] is a 58 year old female patient with oncologic history significant for NSCLC with extensive bilateral hilar, mediastinal, right supraclavicular lymphadenopathy and mets to right arm and brain mets.  Medical oncology/Dr. Marguerita Shih follows.  Of note, patient has missed multiple outpatient medical oncology visits including missed starting pembrolizumab on 01/23/24.  Non Small Cell Lung Cancer with mets to brain, bone Cancer Related pain -- status post craniotomy and SRS in January 2025 -- status post right humerus nailing due to pathologic fracture 10/10/23 -- Planned outpatient Pembrolizumab every 3 weeks, first dose planned 01/23/24 for which she was a No-show.  VM left with no return call.     -- Patient No showed for several outpatient oncology visits and staff could not get through by phone. -- No chemo planned during this admission. -- continue judicious pain management -- Seen by Rad Onc with plans for palliative RT -- follows with Neuro Onc/Dr. Mark Sil for brain mets -- Medical Oncology/Dr. Marguerita Shih following.  Anemia, normocytic -- likely dilution effect +malignancy -- HGB 11.5 today -- No intervention required.  Continue to monitor CBC with diff   Code Status Full  Subjective:  Patient seen awake and alert sitting at side of bed.  States she only "came to handle my arm".  When asked about pain, she denies pain.  Noted she missed chemo appointment, states she did not know about it. No other acute distress is noted.   Objective:   Intake/Output Summary (Last 24 hours) at 02/08/2024 1112 Last data filed at 02/08/2024 0856 Gross per 24 hour  Intake 491.04 ml  Output --  Net 491.04 ml     PHYSICAL EXAMINATION: ECOG PERFORMANCE STATUS: 1 - Symptomatic but completely ambulatory  Vitals:   02/07/24 2109 02/08/24 0503  BP: (!) 155/98 (!) 153/94  Pulse: 85 76  Resp: 16 14  Temp: 98.5 F (36.9 C)  98.4 F (36.9 C)  SpO2: 97% 97%   Filed Weights   02/06/24 2213  Weight: 119 lb 0.8 oz (54 kg)    GENERAL: alert, no distress and comfortable SKIN: skin color, texture, turgor are normal, no rashes or significant lesions EYES: normal, conjunctiva are pink and non-injected, sclera clear OROPHARYNX: no exudate, no erythema and lips, buccal mucosa, and tongue normal  NECK: supple, thyroid  normal size, non-tender, without nodularity LYMPH: no palpable lymphadenopathy in the cervical, axillary or inguinal LUNGS: clear to auscultation and percussion with normal breathing effort HEART: regular rate & rhythm and no murmurs and no lower extremity edema ABDOMEN: abdomen soft, non-tender and normal bowel sounds MUSCULOSKELETAL: no cyanosis of digits and no clubbing  PSYCH: alert & oriented x 3 with fluent speech NEURO: no focal motor/sensory deficits   All questions were answered. The patient knows to call the clinic with any problems, questions or concerns.   The total time spent in the appointment was 40 minutes encounter with patient including review of chart and various tests results, discussions about plan of care and coordination of care plan  Jacqualin Mate, NP 02/08/2024 11:12 AM    Labs Reviewed:  Lab Results  Component Value Date   WBC 11.1 (H) 02/08/2024   HGB 11.5 (L) 02/08/2024   HCT 34.6 (L) 02/08/2024   MCV 85.6 02/08/2024   PLT 480 (H) 02/08/2024   Recent Labs    09/18/23 0620 09/19/23 0405 12/18/23 1745 12/20/23 0550 01/16/24 1007 01/27/24 2242 02/06/24  1617 02/07/24 0537 02/08/24 0545  NA 135   < > 132*   < > 134*   < > 134* 134* 134*  K 4.3   < > 3.0*   < > 2.8*   < > 2.6* 3.5 3.5  CL 103   < > 97*   < > 96*   < > 99 103 101  CO2 19*   < > 21*   < > 28   < > 22 22 23   GLUCOSE 116*   < > 100*   < > 141*   < > 105* 139* 101*  BUN 8   < > 8   < > 11   < > 8 5* 9  CREATININE 0.71   < > 0.73   < > 0.61   < > 0.54 0.41* 0.52  CALCIUM  9.2   < > 9.4   < > 9.5    < > 9.9 9.3 9.4  GFRNONAA >60   < > >60   < > >60   < > >60 >60 >60  PROT 7.2   < > 7.2  --  7.1  --  6.9  --   --   ALBUMIN 3.6   < > 3.8  --  4.1  --  3.5  --   --   AST 23   < > 26  --  21  --  21  --   --   ALT 13   < > 17  --  20  --  17  --   --   ALKPHOS 98   < > 59  --  81  --  130*  --   --   BILITOT 0.8   < > 0.4  --  0.3  --  0.3  --   --   BILIDIR <0.1  --   --   --   --   --   --   --   --   IBILI NOT CALCULATED  --   --   --   --   --   --   --   --    < > = values in this interval not displayed.    Studies Reviewed:  DG Humerus Left Result Date: 02/06/2024 CLINICAL DATA:  Intractable pain. Non-small cell lung cancer with metastatic disease. EXAM: LEFT HUMERUS - 2+ VIEW COMPARISON:  None Available. FINDINGS: Slight cortical thinning in the lateral aspect of the mid humeral shaft which may represent an underlying lucent lesion. No pathologic fracture. Shoulder and elbow alignment are maintained. Unremarkable soft tissues. IMPRESSION: Slight cortical thinning in the lateral aspect of the mid humeral shaft which may represent an underlying lucent lesion. No pathologic fracture. Electronically Signed   By: Chadwick Colonel M.D.   On: 02/06/2024 20:50   DG Humerus Right Result Date: 02/06/2024 CLINICAL DATA:  Intractable pain. Metastatic non-small cell lung cancer. EXAM: RIGHT HUMERUS - 2+ VIEW COMPARISON:  Radiograph 12/19/2023 FINDINGS: Humeral intramedullary nail with proximal and distal locking screw fixation. The distal locking screw extends into the soft tissues by 13 mm, unchanged from most recent prior exam. There is slight increase in adjacent periosteal reaction. Previous lucency in the mid humeral shaft has diminished with increasing bone stock. There is some periosteal reaction about the proximal aspect of the lesion. IMPRESSION: 1. Humeral intramedullary nail with proximal and distal locking screw fixation. The distal locking screw extends into the soft tissues by 13  mm,  unchanged from most recent prior exam. There is increase in periosteal reaction which may be due to motion. 2. Previous lucent lesion in the mid humeral shaft has diminished with increasing bone stock. There is some periosteal reaction about the proximal aspect of the lesion. Electronically Signed   By: Chadwick Colonel M.D.   On: 02/06/2024 20:48   DG Chest 2 View Result Date: 02/06/2024 CLINICAL DATA:  Shortness of breath with chest pain EXAM: CHEST - 2 VIEW COMPARISON:  Chest x-ray 01/31/2024.  CT of the chest 01/28/2024. FINDINGS: TAVR attic changes in the lung bases appear unchanged. Nodular density in the right lung apex is unchanged. There is no new focal lung infiltrate, pleural effusion or pneumothorax. The cardiomediastinal silhouette is within normal limits. IMPRESSION: 1. No active cardiopulmonary disease. 2. Stable nodular density in the right lung apex. Electronically Signed   By: Tyron Gallon M.D.   On: 02/06/2024 17:50   DG Chest Portable 1 View Result Date: 02/01/2024 CLINICAL DATA:  Chest pain and shortness of breath. EXAM: PORTABLE CHEST 1 VIEW COMPARISON:  Jan 27, 2024 FINDINGS: The heart size and mediastinal contours are within normal limits. Mild to moderate severity calcification of the aortic arch is seen. There is evidence of emphysematous lung disease, chronic appearing increased interstitial lung markings and bilateral areas of pulmonary fibrosis. There is stable biapical pleural thickening, scarring and/or atelectasis with hazy opacification along the lateral aspect of the right apex in the area of the patient's known right apical mass. No pleural effusion or pneumothorax is identified. A radiopaque intramedullary rod and proximal fixation screws are seen within the right humerus. An ill-defined lytic area is also seen along the medial aspect of the mid right humeral shaft. IMPRESSION: COPD and findings which likely correspond to the patient's known lateral right apical mass.  Electronically Signed   By: Virgle Grime M.D.   On: 02/01/2024 00:12   CT Angio Chest PE W and/or Wo Contrast Result Date: 01/28/2024 CLINICAL DATA:  Pulmonary embolism (PE) suspected, high prob. Chest pain EXAM: CT ANGIOGRAPHY CHEST WITH CONTRAST TECHNIQUE: Multidetector CT imaging of the chest was performed using the standard protocol during bolus administration of intravenous contrast. Multiplanar CT image reconstructions and MIPs were obtained to evaluate the vascular anatomy. RADIATION DOSE REDUCTION: This exam was performed according to the departmental dose-optimization program which includes automated exposure control, adjustment of the mA and/or kV according to patient size and/or use of iterative reconstruction technique. CONTRAST:  75mL OMNIPAQUE  IOHEXOL  350 MG/ML SOLN COMPARISON:  CT angio chest 12/12/2023, PET CT 10/27/2023 FINDINGS: Cardiovascular: Satisfactory opacification of the pulmonary arteries to the segmental level. No evidence of pulmonary embolism. The main pulmonary artery is normal in caliber. Normal heart size. No significant pericardial effusion. The thoracic aorta is normal in caliber. No atherosclerotic plaque of the thoracic aorta. No coronary artery calcifications. Mediastinum/Nodes: Redemonstration of right hilar lymphadenopathy with as an example a 1.1 cm lymph node (7:162). Redemonstration of right axillary lymphadenopathy. As an example a 1.3 cm lymph node (5:30). No enlarged mediastinal, left hilar, or axillary lymph nodes. Thyroid  gland, trachea, and esophagus demonstrate no significant findings. Lungs/Pleura: Paraseptal and centrilobular moderate emphysema. Bibasilar reticulations and lingular and lower lobe honeycombing. Interval decrease in size of a 2.3 x 1.6 cm (from 2.7 x 1.9 cm) right apical nodular opacity. Persistent biapical pleural/pulmonary scarring. No pulmonary mass. Trace right apical loculated pleural effusion. No pneumothorax. Upper Abdomen: No acute  abnormality. Musculoskeletal: No chest wall abnormality. Interval  increase in size of a manubrial lytic lesion with associated 3.4 x 2.1 cm soft tissue density (9:72, 5:32). No acute displaced fracture. Multilevel degenerative changes of the spine. Review of the MIP images confirms the above findings. IMPRESSION: 1. No pulmonary embolus. 2. Interval decrease in size of a 2.3 x 1.6 cm (from 2.7 x 1.9 cm) right apical nodular opacity consistent with known malignancy. 3. Persistent right hilar and axillary lymphadenopathy. 4. Interval increase in size of a metastatic osseous manubrial lytic lesion with associated 3.4 x 2.1 cm soft tissue density. 5. Trace right apical loculated pleural effusion. 6. Pulmonary fibrosis/interstitial lung disease as well as emphysema. 7. Aortic Atherosclerosis (ICD10-I70.0) and Emphysema (ICD10-J43.9). Electronically Signed   By: Morgane  Naveau M.D.   On: 01/28/2024 01:38   DG Chest 2 View Result Date: 01/27/2024 CLINICAL DATA:  Chest pain EXAM: CHEST - 2 VIEW COMPARISON:  Radiograph and CT 12/12/2023 FINDINGS: Stable cardiomediastinal silhouette. Aortic atherosclerotic calcification. Chronic interstitial lung disease with biapical pleuroparenchymal scarring, bronchitic changes, and reticular opacities in the lower lungs. The known right apical mass is redemonstrated and better evaluated on CT. No pleural effusion or pneumothorax. No displaced rib fracture. Lytic lesion in the right mid humerus. IMPRESSION: Chronic interstitial lung disease similar to 12/12/2023. Known right apical mass is redemonstrated. Lytic lesion in the right mid humerus. Electronically Signed   By: Rozell Cornet M.D.   On: 01/27/2024 23:03

## 2024-02-08 NOTE — Progress Notes (Signed)
 Mobility Specialist - Progress Note   02/08/24 0947  Mobility  Activity Ambulated independently in hallway  Level of Assistance Independent  Assistive Device None  Distance Ambulated (ft) 500 ft  Activity Response Tolerated well  Mobility Referral Yes  Mobility visit 1 Mobility  Mobility Specialist Start Time (ACUTE ONLY) [id number]  Mobility Specialist Stop Time (ACUTE ONLY) 0946  Mobility Specialist Time Calculation (min) (ACUTE ONLY) 9 min   Pt received in bed and agreeable to mobility. No complaints during session. Pt to EOB after session with all needs met.    [facility]

## 2024-02-08 NOTE — Plan of Care (Signed)
  Problem: Clinical Measurements: Goal: Will remain free from infection Outcome: Progressing   Problem: Activity: Goal: Risk for activity intolerance will decrease Outcome: Progressing   Problem: Elimination: Goal: Will not experience complications related to bowel motility Outcome: Progressing Goal: Will not experience complications related to urinary retention Outcome: Progressing   Problem: Pain Managment: Goal: General experience of comfort will improve and/or be controlled Outcome: Progressing   Problem: Safety: Goal: Ability to remain free from injury will improve Outcome: Progressing   Problem: Skin Integrity: Goal: Risk for impaired skin integrity will decrease Outcome: Progressing

## 2024-02-08 NOTE — Progress Notes (Signed)
 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 It appears that oncology has offered her treatment but she has yet to follow-up with them to start treatment. Her speech is difficult to comprehend and she states that this started after her brain surgery. Subjective:  States that left leg hurts.  She then stands and walks on it and shows me that she is limping.  States this started after the "brain surgery".  She is unable to elaborate further on it.  We discussed getting further imaging and she stated that it would cost too much money and she would rather just go to another doctor to figure out what was wrong.  Assessment and Plan: Principal Problem:   Intractable pain related to metastatic lung cancer -Current pain regimen and Decadron  appear to be working for her-if pain remains controlled, will discharge her tomorrow - Have updated oncology and was notified by Dr. Waylan Haggard at that the patient has missed appointments therefore treatment has not been started yet - XRT plan for right arm meds  Active Problems: Dysarthria - Started after craniectomy  Limping in left leg - She does have a left sacral bony metastasis on PET scan which could be causing her problem - Continue pain management        Code Status: Full Code Total time on patient care: 35 minutes DVT prophylaxis:  SCDs Start: 02/06/24 1931     Objective:   Vitals:   02/07/24 1337 02/07/24 2109 02/08/24 0503 02/08/24 1339  BP: (!) 157/93 (!) 155/98 (!) 153/94 (!) 150/94  Pulse: 88 85 76 (!) 103  Resp: 16 16 14 16   Temp: 98.4 F  (36.9 C) 98.5 F (36.9 C) 98.4 F (36.9 C) 98 F (36.7 C)  TempSrc: Oral Oral Oral Oral  SpO2: 100% 97% 97% 96%  Weight:      Height:       Filed Weights   02/06/24 2213  Weight: 54 kg   Exam: General exam: Appears comfortable  HEENT: oral mucosa moist Respiratory system: Clear to auscultation. Neuro: Severe dysarthria Cardiovascular system: S1 & S2 heard  Gastrointestinal system: Abdomen soft, non-tender, nondistended. Normal bowel sounds   Extremities: No cyanosis, clubbing or edema Psychiatry:  Mood & affect appropriate.      CBC: Recent Labs  Lab 02/06/24 1617 02/07/24 0537 02/08/24 0545  WBC 8.4 7.2 11.1*  HGB 12.6 12.3 11.5*  HCT 37.7 37.3 34.6*  MCV 84.7 86.7 85.6  PLT 516* 438* 480*   Basic Metabolic Panel: Recent Labs  Lab 02/06/24 1617 02/07/24 0537 02/08/24 0545  NA 134* 134* 134*  K 2.6* 3.5 3.5  CL 99 103 101  CO2 22 22 23   GLUCOSE 105* 139* 101*  BUN 8 5* 9  CREATININE 0.54 0.41* 0.52  CALCIUM  9.9 9.3 9.4  MG 1.6* 1.7 1.7     Scheduled Meds:  amLODipine   5 mg Oral Daily   dexamethasone   6 mg Oral Daily   docusate sodium   100 mg Oral QHS   levETIRAcetam   500 mg Oral BID   ondansetron  (ZOFRAN )  IV  4 mg Intravenous Once   oxyCODONE   15 mg Oral Q8H   pneumococcal 20-valent conjugate vaccine  0.5 mL Intramuscular Tomorrow-1000   senna-docusate  1 tablet Oral QHS    Imaging and lab data personally reviewed   Author: Cecil Vandyke  02/08/2024 7:16 PM  To contact [facility]>   Check the care team in [facility] and look for the attending/consulting [facility] provider listed  Log into [email or web address] and use [place]'s universal password   Go to> "[facility]"  and find provider  If you still have difficulty reaching the provider, please page the [facility] (Director on Call) for the Hospitalists listed on amion

## 2024-02-09 ENCOUNTER — Other Ambulatory Visit: Payer: Self-pay

## 2024-02-09 ENCOUNTER — Ambulatory Visit: Admission: RE | Admit: 2024-02-09 | Payer: MEDICAID | Source: Ambulatory Visit | Admitting: Urology

## 2024-02-09 ENCOUNTER — Telehealth (HOSPITAL_COMMUNITY): Payer: Self-pay | Admitting: Pharmacy Technician

## 2024-02-09 ENCOUNTER — Ambulatory Visit: Payer: MEDICAID

## 2024-02-09 ENCOUNTER — Other Ambulatory Visit (HOSPITAL_COMMUNITY): Payer: Self-pay

## 2024-02-09 ENCOUNTER — Ambulatory Visit
Admit: 2024-02-09 | Discharge: 2024-02-09 | Disposition: A | Discharge status: Home / Self Care | Payer: MEDICAID | Attending: Radiation Oncology | Admitting: Radiation Oncology

## 2024-02-09 ENCOUNTER — Encounter: Payer: Self-pay | Admitting: Internal Medicine

## 2024-02-09 ENCOUNTER — Ambulatory Visit
Admission: RE | Admit: 2024-02-09 | Discharge: 2024-02-09 | Disposition: A | Discharge status: Home / Self Care | Payer: MEDICAID | Source: Ambulatory Visit | Attending: Radiation Oncology | Admitting: Radiation Oncology

## 2024-02-09 DIAGNOSIS — M79601 Pain in right arm: Secondary | ICD-10-CM

## 2024-02-09 LAB — RAD ONC ARIA SESSION SUMMARY
Course Elapsed Days: 1
Plan Fractions Treated to Date: 2
Plan Prescribed Dose Per Fraction: 3 Gy
Plan Total Fractions Prescribed: 10
Plan Total Prescribed Dose: 30 Gy
Reference Point Dosage Given to Date: 6 Gy
Reference Point Session Dosage Given: 3 Gy
Session Number: 2

## 2024-02-09 MED ORDER — FLEET ENEMA RE ENEM
1.0000 | ENEMA | Freq: Every day | RECTAL | 12 refills | Status: DC | PRN
  Filled 2024-02-09: qty 133, 1d supply, fill #0
  Filled 2024-02-09: qty 230, 1d supply, fill #0

## 2024-02-09 MED ORDER — DOCUSATE SODIUM 100 MG PO CAPS
100.0000 mg | ORAL_CAPSULE | Freq: Every day | ORAL | 0 refills | Status: DC
  Filled 2024-02-09 (×2): qty 10, 10d supply, fill #0

## 2024-02-09 MED ORDER — DEXAMETHASONE 2 MG PO TABS
6.0000 mg | ORAL_TABLET | Freq: Every day | ORAL | 0 refills | Status: DC
  Filled 2024-02-09: qty 30, 30d supply, fill #0
  Filled 2024-02-09: qty 32, 10d supply, fill #0
  Filled 2024-02-09: qty 30, 30d supply, fill #0

## 2024-02-09 MED ORDER — DICLOFENAC SODIUM 1 % EX GEL
4.0000 g | Freq: Four times a day (QID) | CUTANEOUS | Status: DC
  Administered 2024-02-09: 4 g via TOPICAL
  Filled 2024-02-09: qty 100

## 2024-02-09 MED ORDER — SENNOSIDES-DOCUSATE SODIUM 8.6-50 MG PO TABS
2.0000 | ORAL_TABLET | Freq: Every day | ORAL | 0 refills | Status: DC
  Filled 2024-02-09: qty 60, 30d supply, fill #0
  Filled 2024-02-09: qty 180, 90d supply, fill #0

## 2024-02-09 MED ORDER — OXYCODONE HCL ER 15 MG PO T12A
15.0000 mg | EXTENDED_RELEASE_TABLET | Freq: Three times a day (TID) | ORAL | 0 refills | Status: DC
  Filled 2024-02-09: qty 14, 4d supply, fill #0
  Filled 2024-02-09: qty 14, 5d supply, fill #0

## 2024-02-09 MED ORDER — OXYCODONE HCL 10 MG PO TABS
10.0000 mg | ORAL_TABLET | ORAL | 0 refills | Status: DC | PRN
  Filled 2024-02-09: qty 30, 5d supply, fill #0
  Filled 2024-02-09: qty 30, 4d supply, fill #0

## 2024-02-09 MED ORDER — BISACODYL 10 MG RE SUPP
10.0000 mg | RECTAL | 0 refills | Status: DC | PRN
  Filled 2024-02-09: qty 12, 12d supply, fill #0

## 2024-02-09 MED ORDER — BISACODYL 5 MG PO TBEC
5.0000 mg | DELAYED_RELEASE_TABLET | Freq: Every day | ORAL | 0 refills | Status: DC | PRN
  Filled 2024-02-09 (×2): qty 30, 30d supply, fill #0

## 2024-02-09 NOTE — Evaluation (Signed)
 Physical Therapy Evaluation Patient Details Name: Regina [name] MRN: [id number] DOB: Feb 16, 1966 Today's Date: 02/09/2024  History of Present Illness  Regina [name] is a 58 y.o. female admitted 02/06/24 with medical history significant for non-small cell lung cancer with metastatic disease to the brain and bones.  The patient presented with intractable pain.  Clinical Impression  Pt admitted with above diagnosis. Pt had just returned from radiation, and is preapring for dc. Pt declines need for mobility training, but is accepting of assistance related to low back pain and antalgic gait. She reports chronic low back pain and limp which had onset of indentical timing 3 months ago. Pt reports that symptoms fluctuate in synchrony. Pt has no loss of flexion in her low back but has increased pain with extension reproducing c/c. Symptoms are likely vertebrogenic in nature with radicular symptoms from low back to anterior hip. Symptoms reproduced with both hip flexion and hip neutral lowering likelihood of hip pathology. Antalgic gait and low back pain are likely related. Pt would benefit from outpatient PT to address deficits. Pt currently with functional limitations due to the deficits listed below (see PT Problem List). Pt will benefit from acute skilled PT to increase their independence and safety with mobility to allow discharge.         If plan is discharge home, recommend the following:     Can travel by private vehicle        Equipment Recommendations None recommended by PT  Recommendations for Other Services       Functional Status Assessment       Precautions / Restrictions Precautions Precautions: Fall Restrictions Weight Bearing Restrictions Per Provider Order: No      Mobility  Bed Mobility     Rolling: Independent              Transfers Overall transfer level: Independent                 General transfer comment: pt moving about room ,returns from radiation  and is getting ready for dc    Ambulation/Gait Ambulation/Gait assistance: Independent (walks in room, declines to amb in hallway at this time)   Assistive device: None Gait Pattern/deviations: Antalgic, Wide base of support       General Gait Details: Pt has antalgic gait and dec WB on LLE in stance phase, inc weight shift but remains steady and stable  Stairs Stairs:  (Pt demonstrates functional strength and balance for home entry/exit)          Wheelchair Mobility     Tilt Bed    Modified Rankin (Stroke Patients Only)       Balance   Sitting-balance support: No upper extremity supported Sitting balance-Leahy Scale: Normal     Standing balance support: No upper extremity supported, During functional activity Standing balance-Leahy Scale: Good                               Pertinent Vitals/Pain Pain Assessment Pain Assessment: Faces Faces Pain Scale: Hurts even more Pain Location: low back and L hip posterior to anterior Pain Descriptors / Indicators: Aching, Guarding Pain Intervention(s): Limited activity within patient's tolerance, Monitored during session    Home Living   Living Arrangements: Other (Comment);Children;Other relatives (brother and son)               Home Equipment: None Additional Comments: Pt lives with son and brother, 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    Prior Function                       Extremity/Trunk Assessment                Communication   Communication Communication: Impaired Factors Affecting Communication: Reduced clarity of speech    Cognition                                         Cueing       General Comments      Exercises     Assessment/Plan    PT Assessment    PT Problem List Decreased activity tolerance;Decreased mobility       PT Treatment Interventions      PT Goals (Current goals can be found in the Care Plan  section)       Frequency       Co-evaluation               AM-PAC PT "6 Clicks" Mobility  Outcome Measure Help needed turning from your back to your side while in a flat bed without using bedrails?: None Help needed moving from lying on your back to sitting on the side of a flat bed without using bedrails?: None Help needed moving to and from a bed to a chair (including a wheelchair)?: None Help needed standing up from a chair using your arms (e.g., wheelchair or bedside chair)?: None Help needed to walk in hospital room?: None Help needed climbing 3-5 steps with a railing? : None 6 Click Score: 24    End of Session   Activity Tolerance: Patient tolerated treatment well Patient left: in bed;with family/visitor present Nurse Communication: Mobility status PT Visit Diagnosis: Pain;Difficulty in walking, not elsewhere classified (R26.2) Pain - Right/Left: Left Pain - part of body: Hip    Time: 0981-1914 PT Time Calculation (min) (ACUTE ONLY): 11 min   Charges:   PT Evaluation $PT Eval Low Complexity: 1 Low   PT General Charges $$ ACUTE PT VISIT: 1 Visit         Darien Eden PT Acute Rehabilitation Services Office: [phone] 02/09/2024   Serafin Dames 02/09/2024, 1:10 PM

## 2024-02-09 NOTE — Discharge Summary (Signed)
 Physician Discharge Summary  Regina [name] ZOX:[id number] DOB: 06-Apr-1966 DOA: 02/06/2024  PCP: Marius Siemens, NP  Admit date: 02/06/2024 Discharge date: 02/12/2024 Discharging to: home    Consults:  Med Oncology Palliative care Rad onc Procedures:  CT simulation 6/3   Discharge Diagnoses:   Principal Problem:   Arm pain, diffuse, right- due to metastatic lung cancer Active Problems:   Pathological fracture in neoplastic disease, right humerus, initial encounter for fracture   Lung cancer metastatic to brain [facility])   Left leg weakness   Dysarthria    Brief hospital course: This is a 58 year old female with 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 It appears that oncology has offered her treatment but she has yet to follow-up with them to start treatment. Her speech is difficult to comprehend and she states that this started after her brain surgery.   Assessment and Plan: Principal Problem:   Intractable right arm pain related to humeral metastasis, RUL lung cancer with h/o brain met s/p resection and SRS - discussed with Dr Neldon [place] notes that she has missed f/u appointments at the cancer center and therefore not yet on treatment- Lisa Rouson, oncology NP has met with patient and discussed the importance of follow up so that she can have treatment - She states that her upper right arm pain is still 9 out of 10 but then also states that current pain regimen is adequate for her and she dose not care to increase it - Continue oxycodone , OxyContin  and and Decadron -I have told her I am giving her a 7-day supply and she will need to follow-up as outpatient for further prescriptions-she confirms understanding - XRT for right arm x 2 weeks has been started   Active  Problems: Dysarthria - Started after craniectomy   Left leg weakness- multifactorial - she declined MRI and therefore I could not assess for nerve impingement - has mets in left sacrum, painful muscles in left buttock and lower back - agreeable to Voltaren  gel to lower back - has declined HHPT        Discharge Instructions  Discharge Instructions     Amb Referral to Palliative Care   Complete by: As directed    Please call patient's niece [name] to schedule.      Allergies as of 02/09/2024       Reactions   Pork Allergy Nausea And Vomiting   Bee Venom Hives        Medication List     TAKE these medications    acetaminophen  500 MG tablet Commonly known as: TYLENOL  Take 2 tablets (1,000 mg total) by mouth every 8 (eight) hours as needed for mild pain (pain score 1-3) or moderate pain (pain score 4-6).   amLODipine  5 MG tablet Commonly known as: NORVASC  Take 1 tablet (5 mg total) by mouth daily.   bisacodyl  5 MG EC tablet Generic drug: bisacodyl  Take 1 tablet (5 mg total) by mouth daily as needed for moderate constipation.   bisacodyl  10 MG suppository Commonly known as: Dulcolax Place 1 suppository (10 mg total) rectally as needed for moderate constipation.   dexamethasone  2 MG tablet Commonly known as: DECADRON  Take 3 tablets (6 mg total) by mouth daily. What changed:  medication strength See the new instructions.   diclofenac   Sodium 1 % Gel Commonly known as: Voltaren  Apply 2 g topically 4 (four) times daily.   docusate sodium  100 MG capsule Commonly known as: COLACE Take 1 capsule (100 mg total) by mouth at bedtime.   levETIRAcetam  500 MG tablet Commonly known as: KEPPRA  Take 1 tablet (500 mg total) by mouth 2 (two) times daily.   metoprolol  tartrate 25 MG tablet Commonly known as: LOPRESSOR  Take 1 tablet (25 mg total) by mouth 2 (two) times daily.   Oxycodone  HCl 10 MG Tabs Take 1-1.5 tablets (10-15 mg total) by mouth every 4 (four) hours  as needed for up to 7 days for breakthrough pain. What changed:  medication strength how much to take when to take this reasons to take this   OxyCONTIN  15 MG 12 hr tablet Generic drug: oxyCODONE  Take 1 tablet (15 mg total) by mouth every 8 (eight) hours for 7 days. What changed: You were already taking a medication with the same name, and this prescription was added. Make sure you understand how and when to take each.   pantoprazole  40 MG tablet Commonly known as: Protonix  Take 1 tablet (40 mg total) by mouth daily.   polyethylene glycol 17 g packet Commonly known as: MIRALAX  / GLYCOLAX  Mix 1 packet (17 g) in 4-8 ounces of water and take by mouth daily.   potassium chloride  SA 20 MEQ tablet Commonly known as: KLOR-CON  M Take 1 tablet (20 mEq total) by mouth 2 (two) times daily.   senna 8.6 MG Tabs tablet Commonly known as: SENOKOT Take 1 tablet (8.6 mg total) by mouth 2 (two) times daily.   sodium phosphate  Enem Place 133 mLs (1 enema total) rectally daily as needed for severe constipation.   Stool Softener/Laxative 50-8.6 MG tablet Generic drug: senna-docusate Take 2 tablets by mouth at bedtime.            The results of significant diagnostics from this hospitalization (including imaging, microbiology, ancillary and laboratory) are listed below for reference.    DG Humerus Left Result Date: 02/06/2024 CLINICAL DATA:  Intractable pain. Non-small cell lung cancer with metastatic disease. EXAM: LEFT HUMERUS - 2+ VIEW COMPARISON:  None Available. FINDINGS: Slight cortical thinning in the lateral aspect of the mid humeral shaft which may represent an underlying lucent lesion. No pathologic fracture. Shoulder and elbow alignment are maintained. Unremarkable soft tissues. IMPRESSION: Slight cortical thinning in the lateral aspect of the mid humeral shaft which may represent an underlying lucent lesion. No pathologic fracture. Electronically Signed   By: Chadwick Colonel M.D.    On: 02/06/2024 20:50   DG Humerus Right Result Date: 02/06/2024 CLINICAL DATA:  Intractable pain. Metastatic non-small cell lung cancer. EXAM: RIGHT HUMERUS - 2+ VIEW COMPARISON:  Radiograph 12/19/2023 FINDINGS: Humeral intramedullary nail with proximal and distal locking screw fixation. The distal locking screw extends into the soft tissues by 13 mm, unchanged from most recent prior exam. There is slight increase in adjacent periosteal reaction. Previous lucency in the mid humeral shaft has diminished with increasing bone stock. There is some periosteal reaction about the proximal aspect of the lesion. IMPRESSION: 1. Humeral intramedullary nail with proximal and distal locking screw fixation. The distal locking screw extends into the soft tissues by 13 mm, unchanged from most recent prior exam. There is increase in periosteal reaction which may be due to motion. 2. Previous lucent lesion in the mid humeral shaft has diminished with increasing bone stock. There is some periosteal reaction about the proximal aspect of  the lesion. Electronically Signed   By: Chadwick Colonel M.D.   On: 02/06/2024 20:48   DG Chest 2 View Result Date: 02/06/2024 CLINICAL DATA:  Shortness of breath with chest pain EXAM: CHEST - 2 VIEW COMPARISON:  Chest x-ray 01/31/2024.  CT of the chest 01/28/2024. FINDINGS: TAVR attic changes in the lung bases appear unchanged. Nodular density in the right lung apex is unchanged. There is no new focal lung infiltrate, pleural effusion or pneumothorax. The cardiomediastinal silhouette is within normal limits. IMPRESSION: 1. No active cardiopulmonary disease. 2. Stable nodular density in the right lung apex. Electronically Signed   By: Tyron Gallon M.D.   On: 02/06/2024 17:50   DG Chest Portable 1 View Result Date: 02/01/2024 CLINICAL DATA:  Chest pain and shortness of breath. EXAM: PORTABLE CHEST 1 VIEW COMPARISON:  Jan 27, 2024 FINDINGS: The heart size and mediastinal contours are within normal  limits. Mild to moderate severity calcification of the aortic arch is seen. There is evidence of emphysematous lung disease, chronic appearing increased interstitial lung markings and bilateral areas of pulmonary fibrosis. There is stable biapical pleural thickening, scarring and/or atelectasis with hazy opacification along the lateral aspect of the right apex in the area of the patient's known right apical mass. No pleural effusion or pneumothorax is identified. A radiopaque intramedullary rod and proximal fixation screws are seen within the right humerus. An ill-defined lytic area is also seen along the medial aspect of the mid right humeral shaft. IMPRESSION: COPD and findings which likely correspond to the patient's known lateral right apical mass. Electronically Signed   By: Virgle Grime M.D.   On: 02/01/2024 00:12   CT Angio Chest PE W and/or Wo Contrast Result Date: 01/28/2024 CLINICAL DATA:  Pulmonary embolism (PE) suspected, high prob. Chest pain EXAM: CT ANGIOGRAPHY CHEST WITH CONTRAST TECHNIQUE: Multidetector CT imaging of the chest was performed using the standard protocol during bolus administration of intravenous contrast. Multiplanar CT image reconstructions and MIPs were obtained to evaluate the vascular anatomy. RADIATION DOSE REDUCTION: This exam was performed according to the departmental dose-optimization program which includes automated exposure control, adjustment of the mA and/or kV according to patient size and/or use of iterative reconstruction technique. CONTRAST:  75mL OMNIPAQUE  IOHEXOL  350 MG/ML SOLN COMPARISON:  CT angio chest 12/12/2023, PET CT 10/27/2023 FINDINGS: Cardiovascular: Satisfactory opacification of the pulmonary arteries to the segmental level. No evidence of pulmonary embolism. The main pulmonary artery is normal in caliber. Normal heart size. No significant pericardial effusion. The thoracic aorta is normal in caliber. No atherosclerotic plaque of the thoracic  aorta. No coronary artery calcifications. Mediastinum/Nodes: Redemonstration of right hilar lymphadenopathy with as an example a 1.1 cm lymph node (7:162). Redemonstration of right axillary lymphadenopathy. As an example a 1.3 cm lymph node (5:30). No enlarged mediastinal, left hilar, or axillary lymph nodes. Thyroid  gland, trachea, and esophagus demonstrate no significant findings. Lungs/Pleura: Paraseptal and centrilobular moderate emphysema. Bibasilar reticulations and lingular and lower lobe honeycombing. Interval decrease in size of a 2.3 x 1.6 cm (from 2.7 x 1.9 cm) right apical nodular opacity. Persistent biapical pleural/pulmonary scarring. No pulmonary mass. Trace right apical loculated pleural effusion. No pneumothorax. Upper Abdomen: No acute abnormality. Musculoskeletal: No chest wall abnormality. Interval increase in size of a manubrial lytic lesion with associated 3.4 x 2.1 cm soft tissue density (9:72, 5:32). No acute displaced fracture. Multilevel degenerative changes of the spine. Review of the MIP images confirms the above findings. IMPRESSION: 1. No pulmonary embolus. 2. Interval  decrease in size of a 2.3 x 1.6 cm (from 2.7 x 1.9 cm) right apical nodular opacity consistent with known malignancy. 3. Persistent right hilar and axillary lymphadenopathy. 4. Interval increase in size of a metastatic osseous manubrial lytic lesion with associated 3.4 x 2.1 cm soft tissue density. 5. Trace right apical loculated pleural effusion. 6. Pulmonary fibrosis/interstitial lung disease as well as emphysema. 7. Aortic Atherosclerosis (ICD10-I70.0) and Emphysema (ICD10-J43.9). Electronically Signed   By: Morgane  Naveau M.D.   On: 01/28/2024 01:38   DG Chest 2 View Result Date: 01/27/2024 CLINICAL DATA:  Chest pain EXAM: CHEST - 2 VIEW COMPARISON:  Radiograph and CT 12/12/2023 FINDINGS: Stable cardiomediastinal silhouette. Aortic atherosclerotic calcification. Chronic interstitial lung disease with biapical  pleuroparenchymal scarring, bronchitic changes, and reticular opacities in the lower lungs. The known right apical mass is redemonstrated and better evaluated on CT. No pleural effusion or pneumothorax. No displaced rib fracture. Lytic lesion in the right mid humerus. IMPRESSION: Chronic interstitial lung disease similar to 12/12/2023. Known right apical mass is redemonstrated. Lytic lesion in the right mid humerus. Electronically Signed   By: Rozell Cornet M.D.   On: 01/27/2024 23:03   Labs:   Basic Metabolic Panel: Recent Labs  Lab 02/06/24 1617 02/07/24 0537 02/08/24 0545  NA 134* 134* 134*  K 2.6* 3.5 3.5  CL 99 103 101  CO2 22 22 23   GLUCOSE 105* 139* 101*  BUN 8 5* 9  CREATININE 0.54 0.41* 0.52  CALCIUM  9.9 9.3 9.4  MG 1.6* 1.7 1.7     CBC: Recent Labs  Lab 02/06/24 1617 02/07/24 0537 02/08/24 0545  WBC 8.4 7.2 11.1*  HGB 12.6 12.3 11.5*  HCT 37.7 37.3 34.6*  MCV 84.7 86.7 85.6  PLT 516* 438* 480*         SIGNED:   Sedalia Dacosta, MD  [facility] 02/12/2024, 5:42 PM Time taking on discharge: 50 minutes

## 2024-02-09 NOTE — Telephone Encounter (Signed)
 Pharmacy Patient Advocate Encounter  Received notification from [facility] that Prior Authorization for OxyCONTIN  15MG  er tablets  has been APPROVED from 02/09/2024 to 02/08/2025   PA #/Case ID/Reference #: [id number]

## 2024-02-09 NOTE — Plan of Care (Signed)
  Problem: Clinical Measurements: Goal: Will remain free from infection Outcome: Progressing   Problem: Elimination: Goal: Will not experience complications related to bowel motility Outcome: Progressing Goal: Will not experience complications related to urinary retention Outcome: Progressing   Problem: Pain Managment: Goal: General experience of comfort will improve and/or be controlled Outcome: Progressing   Problem: Safety: Goal: Ability to remain free from injury will improve Outcome: Progressing   Problem: Skin Integrity: Goal: Risk for impaired skin integrity will decrease Outcome: Progressing

## 2024-02-09 NOTE — Telephone Encounter (Signed)
 Pharmacy Patient Advocate Encounter   Received notification from Inpatient Request that prior authorization for OxyCONTIN  15MG  er tablets  is required/requested.   Insurance verification completed.   The patient is insured through [employer]  [place] .   Per test claim: PA required; PA submitted to above mentioned insurance via CoverMyMeds Key/confirmation #/EOC [id number] Status is pending

## 2024-02-09 NOTE — Plan of Care (Signed)

## 2024-02-10 ENCOUNTER — Other Ambulatory Visit (HOSPITAL_COMMUNITY): Payer: Self-pay

## 2024-02-10 ENCOUNTER — Telehealth: Payer: Self-pay

## 2024-02-10 ENCOUNTER — Ambulatory Visit
Admission: RE | Admit: 2024-02-10 | Discharge: 2024-02-10 | Disposition: A | Discharge status: Home / Self Care | Payer: MEDICAID | Source: Ambulatory Visit | Attending: Radiation Oncology | Admitting: Radiation Oncology

## 2024-02-10 ENCOUNTER — Other Ambulatory Visit (INDEPENDENT_AMBULATORY_CARE_PROVIDER_SITE_OTHER): Payer: Self-pay | Admitting: Primary Care

## 2024-02-10 ENCOUNTER — Telehealth: Payer: Self-pay | Admitting: *Deleted

## 2024-02-10 ENCOUNTER — Other Ambulatory Visit: Payer: Self-pay

## 2024-02-10 DIAGNOSIS — Z510 Encounter for antineoplastic radiation therapy: Secondary | ICD-10-CM | POA: Insufficient documentation

## 2024-02-10 DIAGNOSIS — C7951 Secondary malignant neoplasm of bone: Secondary | ICD-10-CM | POA: Insufficient documentation

## 2024-02-10 DIAGNOSIS — C3411 Malignant neoplasm of upper lobe, right bronchus or lung: Secondary | ICD-10-CM | POA: Insufficient documentation

## 2024-02-10 LAB — RAD ONC ARIA SESSION SUMMARY
Course Elapsed Days: 2
Plan Fractions Treated to Date: 3
Plan Prescribed Dose Per Fraction: 3 Gy
Plan Total Fractions Prescribed: 10
Plan Total Prescribed Dose: 30 Gy
Reference Point Dosage Given to Date: 9 Gy
Reference Point Session Dosage Given: 3 Gy
Session Number: 3

## 2024-02-10 NOTE — Telephone Encounter (Addendum)
 Received voicemail from the patient stating, "my face is exploding, the last time I was here, someone said they were going to give me moisturizer."  Returned the call and spoke with the patient, who reported that her "face is swollen up" and that she is in need of a moisturizer. Asked the patient if her face felt dry, as moisturizer would not address facial swelling. Informed the patient that it appears she may have reached the wrong department, and her call was transferred to Radiation for further evaluation and assistance.  Reminded patient of appt on Monday for 730 labs, 800 provider visit and 900 infusion.

## 2024-02-10 NOTE — Transitions of Care (Post Inpatient/ED Visit) (Signed)
   02/10/2024  Name: Regina [name] MRN: [id number] DOB: Jul 18, 1966  Today's TOC FU Call Status: Today's TOC FU Call Status:: Unsuccessful Call (1st Attempt) Unsuccessful Call (1st Attempt) Date: 02/10/24  Attempted to reach the patient regarding the most recent Inpatient/ED visit.  Follow Up Plan: Additional outreach attempts will be made to reach the patient to complete the Transitions of Care (Post Inpatient/ED visit) call.   Arna Better RN, BSN [place]  [facility] RN Care Manager [phone]

## 2024-02-10 NOTE — Telephone Encounter (Signed)
 RN attempted to call Ms. Okey was unable to reach her at this time message left to return or if its an emergency to dial 911 for assistance as our clinic will be closing soon.

## 2024-02-12 DIAGNOSIS — R471 Dysarthria and anarthria: Secondary | ICD-10-CM | POA: Insufficient documentation

## 2024-02-12 DIAGNOSIS — R29898 Other symptoms and signs involving the musculoskeletal system: Secondary | ICD-10-CM | POA: Insufficient documentation

## 2024-02-12 NOTE — Progress Notes (Unsigned)
 [facility] OFFICE PROGRESS NOTE  Marius Siemens, NP [address] [place] [address]  DIAGNOSIS:  Stage IV (T1c, N3, M1 C) non-small cell lung cancer, adenocarcinoma presented with right upper lobe lung mass in addition to extensive bilateral hilar, mediastinal as well as right supraclavicular lymphadenopathy and metastatic disease to the bone in the right arm as well as brain metastasis diagnosed in January 2025.    Molecular Studies: No actionable mutations   TMB: 14 mut/mb   PDL1" 90%  PRIOR THERAPY: 1) Craniotomy and SRS under the care of Dr. Michale Age and Dr. Lorri Rota on 10/06/23 and 10/07/23 2) Right humeral nailing by Dr. Hermina Loosen due to pathologic fracture on 10/10/23 3) 2 doses of palliative radiation to the humeral head under the care of Dr. Lurena Sally but lost to follow up in March 2025.  4) Palliative radiation to the left sacrum in 5 fractions on 12/26/23  CURRENT THERAPY:  1) Pembrolizumab 200 Mg IV every 3 weeks.  First dose 02/13/24 2) Palliative radiation to the right humeral shaft under the care of Dr. Lorri Rota. Last dose expected on 02/21/24  INTERVAL HISTORY: Regina [name] 58 y.o. female returns to the clinic today for a follow-up visit.  In summary the patient has stage IV non-small cell lung cancer.  She establish care in the clinic in late January/early February 2025. It has been very challenging to start systemic treatment for the patient as there issues with compliance.  Dr. Marguerita Shih recommended systemic treatment with pembrolizumab (immunotherapy).  She was supposed to start her first treatment originally in early March 2025.  She then was lost to follow-up. She reestablished in the clinic in May 2025 and saw Dr. Marguerita Shih and was supposed to start treatment on 01/23/2024 but the patient no-showed her appointments and have not been able to reach her via out reach on the telephone. The patient has been in the hospital since she was last seen in the clinic  and her appointments have been relayed to her.   She was most recently hospitalized last week on 02/06/2024.  She was discharged on 02/09/24.  She is feeling fair today but she is worried about being out of her blood pressure medicines for 2 days.  She typically takes Norvasc  5 mg.  He is scheduled to see her PCP on Wednesday.  He denies any signs and symptoms of her hypertension including chest pain, headaches, vision changes, or weakness.  She had a driver to her appointments today.  It looks like a prescription for Toprol  with 1 refill was sent to the pharmacy last month.   She is currently undergoing palliative radiation to the right humeral shaft under the care of Dr. Lorri Rota.  Her last day radiation is expected on 02/21/2024.  He states her energy comes and goes.  She denies any fever or chills.  She has night sweats.  She reports her appetite is pretty good.  She also states her breathing is "pretty good".  She denies any dyspnea on exertion, chest pain, cough, or hemoptysis.  She denies any headache or visual changes.  She sometimes has some mild itchiness on her back.  She also has some facial swelling likely secondary to Decadron .  She denies any nausea, vomiting, diarrhea, or constipation.  She is here today for repeat blood work before undergoing her first cycle of immunotherapy.   MEDICAL HISTORY: Past Medical History:  Diagnosis Date   Allergy    Anxiety    Brain mass  Cancer (HCC)    COPD (chronic obstructive pulmonary disease) (HCC)    Emphysema of lung (HCC)    Hypertension     ALLERGIES:  is allergic to pork allergy and bee venom.  MEDICATIONS:  Current Outpatient Medications  Medication Sig Dispense Refill   acetaminophen  (TYLENOL ) 500 MG tablet Take 2 tablets (1,000 mg total) by mouth every 8 (eight) hours as needed for mild pain (pain score 1-3) or moderate pain (pain score 4-6). 60 tablet 0   amLODipine  (NORVASC ) 5 MG tablet Take 1 tablet (5 mg total) by mouth daily.  30 tablet 0   bisacodyl  (DULCOLAX) 10 MG suppository Place 1 suppository (10 mg total) rectally as needed for moderate constipation. 12 suppository 0   bisacodyl  (DULCOLAX) 5 MG EC tablet Take 1 tablet (5 mg total) by mouth daily as needed for moderate constipation. 30 tablet 0   dexamethasone  (DECADRON ) 2 MG tablet Take 3 tablets (6 mg total) by mouth daily. 90 tablet 0   diclofenac  Sodium (VOLTAREN ) 1 % GEL Apply 2 g topically 4 (four) times daily. (Patient taking differently: Apply 2 g topically as needed.) 50 g 0   docusate sodium  (COLACE) 100 MG capsule Take 1 capsule (100 mg total) by mouth at bedtime. 10 capsule 0   levETIRAcetam  (KEPPRA ) 500 MG tablet Take 1 tablet (500 mg total) by mouth 2 (two) times daily. 60 tablet 1   metoprolol  tartrate (LOPRESSOR ) 25 MG tablet Take 1 tablet (25 mg total) by mouth 2 (two) times daily. 60 tablet 1   oxyCODONE  (OXYCONTIN ) 15 mg 12 hr tablet Take 1 tablet (15 mg total) by mouth every 8 (eight) hours for 7 days. 14 tablet 0   Oxycodone  HCl 10 MG TABS Take 1-1.5 tablets (10-15 mg total) by mouth every 4 (four) hours as needed for up to 7 days for breakthrough pain. 30 tablet 0   pantoprazole  (PROTONIX ) 40 MG tablet Take 1 tablet (40 mg total) by mouth daily. 30 tablet 1   polyethylene glycol (MIRALAX  / GLYCOLAX ) 17 g packet Mix 1 packet (17 g) in 4-8 ounces of water and take by mouth daily. 14 each 0   potassium chloride  SA (KLOR-CON  M) 20 MEQ tablet Take 1 tablet (20 mEq total) by mouth 2 (two) times daily. 10 tablet 0   senna (SENOKOT) 8.6 MG TABS tablet Take 1 tablet (8.6 mg total) by mouth 2 (two) times daily. 30 tablet 0   senna-docusate (SENOKOT-S) 8.6-50 MG tablet Take 2 tablets by mouth at bedtime. 180 tablet 0   sodium phosphate  (FLEET) ENEM Place 133 mLs (1 enema total) rectally daily as needed for severe constipation. 230 mL 12   No current facility-administered medications for this visit.    SURGICAL HISTORY:  Past Surgical History:   Procedure Laterality Date   APPLICATION OF CRANIAL NAVIGATION Left 10/07/2023   Procedure: APPLICATION OF CRANIAL NAVIGATION;  Surgeon: Audie Bleacher, MD;  Location: [facility] OR;  Service: Neurosurgery;  Laterality: Left;   CRANIOTOMY Left 10/07/2023   Procedure: FRONTAL CRANIOTOMY FOR ABSCESS;  Surgeon: Audie Bleacher, MD;  Location: [facility];  Service: Neurosurgery;  Laterality: Left;   dental procedure     HUMERUS IM NAIL Right 10/10/2023   Procedure: INTRAMEDULLARY (IM) NAIL HUMERAL RIGHT;  Surgeon: Wilhelmenia Harada, MD;  Location: [facility] OR;  Service: Orthopedics;  Laterality: Right;    REVIEW OF SYSTEMS:   Review of Systems  Constitutional: Positive for fatigue. Negative for appetite change, chills, fever and unexpected weight change.  HENT:  Negative for mouth sores, nosebleeds, sore throat and trouble swallowing.   Eyes: Negative for eye problems and icterus.  Respiratory: Negative for cough, hemoptysis, shortness of breath and wheezing.   Cardiovascular: Negative for chest pain. Positive for ankle swelling bilaterally.  Gastrointestinal: Negative for abdominal pain, constipation, diarrhea, nausea and vomiting.  Genitourinary: Negative for bladder incontinence, difficulty urinating, dysuria, frequency and hematuria.   Musculoskeletal: Negative for back pain, gait problem, neck pain and neck stiffness.  Skin: Negative for itching and rash.  Neurological: Negative for dizziness, extremity weakness, gait problem, headaches, light-headedness and seizures.  Hematological: Negative for adenopathy. Does not bruise/bleed easily.  Psychiatric/Behavioral: Negative for confusion, depression and sleep disturbance. The patient is not nervous/anxious.     PHYSICAL EXAMINATION:  Last menstrual period 06/06/2010.  ECOG PERFORMANCE STATUS: 1  Physical Exam  Constitutional: Oriented to person, place, and time and well-developed, well-nourished, and in no distress.  HENT:  Head: Normocephalic and  atraumatic.  Mouth/Throat: Oropharynx is clear and moist. No oropharyngeal exudate.  Eyes: Conjunctivae are normal. Right eye exhibits no discharge. Left eye exhibits no discharge. No scleral icterus.  Neck: Normal range of motion. Neck supple.  Cardiovascular: Normal rate, regular rhythm, normal heart sounds and intact distal pulses.   Pulmonary/Chest: Effort normal and breath sounds normal. No respiratory distress. No wheezes. No rales.  Abdominal: Soft. Bowel sounds are normal. Exhibits no distension and no mass. There is no tenderness.  Musculoskeletal: Normal range of motion. Ankle swelling bilaterally and moon facies. Her right arm is in a sling.  Lymphadenopathy:    No cervical adenopathy.  Neurological: Alert and oriented to person, place, and time. Exhibits normal muscle tone. Gait normal. Coordination normal.  Skin: Skin is warm and dry. No rash noted. Not diaphoretic. No erythema. No pallor.  Psychiatric: Mood, memory and judgment normal.  Vitals reviewed.  LABORATORY DATA: Lab Results  Component Value Date   WBC 11.1 (H) 02/08/2024   HGB 11.5 (L) 02/08/2024   HCT 34.6 (L) 02/08/2024   MCV 85.6 02/08/2024   PLT 480 (H) 02/08/2024      Chemistry      Component Value Date/Time   NA 134 (L) 02/08/2024 0545   NA 130 (L) 10/19/2017 0942   K 3.5 02/08/2024 0545   CL 101 02/08/2024 0545   CO2 23 02/08/2024 0545   BUN 9 02/08/2024 0545   BUN 4 (L) 10/19/2017 0942   CREATININE 0.52 02/08/2024 0545   CREATININE 0.61 01/16/2024 1007      Component Value Date/Time   CALCIUM  9.4 02/08/2024 0545   ALKPHOS 130 (H) 02/06/2024 1617   AST 21 02/06/2024 1617   AST 21 01/16/2024 1007   ALT 17 02/06/2024 1617   ALT 20 01/16/2024 1007   BILITOT 0.3 02/06/2024 1617   BILITOT 0.3 01/16/2024 1007       RADIOGRAPHIC STUDIES:  DG Humerus Left Result Date: 02/06/2024 CLINICAL DATA:  Intractable pain. Non-small cell lung cancer with metastatic disease. EXAM: LEFT HUMERUS - 2+ VIEW  COMPARISON:  None Available. FINDINGS: Slight cortical thinning in the lateral aspect of the mid humeral shaft which may represent an underlying lucent lesion. No pathologic fracture. Shoulder and elbow alignment are maintained. Unremarkable soft tissues. IMPRESSION: Slight cortical thinning in the lateral aspect of the mid humeral shaft which may represent an underlying lucent lesion. No pathologic fracture. Electronically Signed   By: Chadwick Colonel M.D.   On: 02/06/2024 20:50   DG Humerus Right Result Date: 02/06/2024 CLINICAL DATA:  Intractable pain. Metastatic non-small cell lung cancer. EXAM: RIGHT HUMERUS - 2+ VIEW COMPARISON:  Radiograph 12/19/2023 FINDINGS: Humeral intramedullary nail with proximal and distal locking screw fixation. The distal locking screw extends into the soft tissues by 13 mm, unchanged from most recent prior exam. There is slight increase in adjacent periosteal reaction. Previous lucency in the mid humeral shaft has diminished with increasing bone stock. There is some periosteal reaction about the proximal aspect of the lesion. IMPRESSION: 1. Humeral intramedullary nail with proximal and distal locking screw fixation. The distal locking screw extends into the soft tissues by 13 mm, unchanged from most recent prior exam. There is increase in periosteal reaction which may be due to motion. 2. Previous lucent lesion in the mid humeral shaft has diminished with increasing bone stock. There is some periosteal reaction about the proximal aspect of the lesion. Electronically Signed   By: Chadwick Colonel M.D.   On: 02/06/2024 20:48   DG Chest 2 View Result Date: 02/06/2024 CLINICAL DATA:  Shortness of breath with chest pain EXAM: CHEST - 2 VIEW COMPARISON:  Chest x-ray 01/31/2024.  CT of the chest 01/28/2024. FINDINGS: TAVR attic changes in the lung bases appear unchanged. Nodular density in the right lung apex is unchanged. There is no new focal lung infiltrate, pleural effusion or  pneumothorax. The cardiomediastinal silhouette is within normal limits. IMPRESSION: 1. No active cardiopulmonary disease. 2. Stable nodular density in the right lung apex. Electronically Signed   By: Tyron Gallon M.D.   On: 02/06/2024 17:50   DG Chest Portable 1 View Result Date: 02/01/2024 CLINICAL DATA:  Chest pain and shortness of breath. EXAM: PORTABLE CHEST 1 VIEW COMPARISON:  Jan 27, 2024 FINDINGS: The heart size and mediastinal contours are within normal limits. Mild to moderate severity calcification of the aortic arch is seen. There is evidence of emphysematous lung disease, chronic appearing increased interstitial lung markings and bilateral areas of pulmonary fibrosis. There is stable biapical pleural thickening, scarring and/or atelectasis with hazy opacification along the lateral aspect of the right apex in the area of the patient's known right apical mass. No pleural effusion or pneumothorax is identified. A radiopaque intramedullary rod and proximal fixation screws are seen within the right humerus. An ill-defined lytic area is also seen along the medial aspect of the mid right humeral shaft. IMPRESSION: COPD and findings which likely correspond to the patient's known lateral right apical mass. Electronically Signed   By: Virgle Grime M.D.   On: 02/01/2024 00:12   CT Angio Chest PE W and/or Wo Contrast Result Date: 01/28/2024 CLINICAL DATA:  Pulmonary embolism (PE) suspected, high prob. Chest pain EXAM: CT ANGIOGRAPHY CHEST WITH CONTRAST TECHNIQUE: Multidetector CT imaging of the chest was performed using the standard protocol during bolus administration of intravenous contrast. Multiplanar CT image reconstructions and MIPs were obtained to evaluate the vascular anatomy. RADIATION DOSE REDUCTION: This exam was performed according to the departmental dose-optimization program which includes automated exposure control, adjustment of the mA and/or kV according to patient size and/or use of  iterative reconstruction technique. CONTRAST:  75mL OMNIPAQUE  IOHEXOL  350 MG/ML SOLN COMPARISON:  CT angio chest 12/12/2023, PET CT 10/27/2023 FINDINGS: Cardiovascular: Satisfactory opacification of the pulmonary arteries to the segmental level. No evidence of pulmonary embolism. The main pulmonary artery is normal in caliber. Normal heart size. No significant pericardial effusion. The thoracic aorta is normal in caliber. No atherosclerotic plaque of the thoracic aorta. No coronary artery calcifications. Mediastinum/Nodes: Redemonstration of right hilar lymphadenopathy  with as an example a 1.1 cm lymph node (7:162). Redemonstration of right axillary lymphadenopathy. As an example a 1.3 cm lymph node (5:30). No enlarged mediastinal, left hilar, or axillary lymph nodes. Thyroid  gland, trachea, and esophagus demonstrate no significant findings. Lungs/Pleura: Paraseptal and centrilobular moderate emphysema. Bibasilar reticulations and lingular and lower lobe honeycombing. Interval decrease in size of a 2.3 x 1.6 cm (from 2.7 x 1.9 cm) right apical nodular opacity. Persistent biapical pleural/pulmonary scarring. No pulmonary mass. Trace right apical loculated pleural effusion. No pneumothorax. Upper Abdomen: No acute abnormality. Musculoskeletal: No chest wall abnormality. Interval increase in size of a manubrial lytic lesion with associated 3.4 x 2.1 cm soft tissue density (9:72, 5:32). No acute displaced fracture. Multilevel degenerative changes of the spine. Review of the MIP images confirms the above findings. IMPRESSION: 1. No pulmonary embolus. 2. Interval decrease in size of a 2.3 x 1.6 cm (from 2.7 x 1.9 cm) right apical nodular opacity consistent with known malignancy. 3. Persistent right hilar and axillary lymphadenopathy. 4. Interval increase in size of a metastatic osseous manubrial lytic lesion with associated 3.4 x 2.1 cm soft tissue density. 5. Trace right apical loculated pleural effusion. 6. Pulmonary  fibrosis/interstitial lung disease as well as emphysema. 7. Aortic Atherosclerosis (ICD10-I70.0) and Emphysema (ICD10-J43.9). Electronically Signed   By: Morgane  Naveau M.D.   On: 01/28/2024 01:38   DG Chest 2 View Result Date: 01/27/2024 CLINICAL DATA:  Chest pain EXAM: CHEST - 2 VIEW COMPARISON:  Radiograph and CT 12/12/2023 FINDINGS: Stable cardiomediastinal silhouette. Aortic atherosclerotic calcification. Chronic interstitial lung disease with biapical pleuroparenchymal scarring, bronchitic changes, and reticular opacities in the lower lungs. The known right apical mass is redemonstrated and better evaluated on CT. No pleural effusion or pneumothorax. No displaced rib fracture. Lytic lesion in the right mid humerus. IMPRESSION: Chronic interstitial lung disease similar to 12/12/2023. Known right apical mass is redemonstrated. Lytic lesion in the right mid humerus. Electronically Signed   By: Rozell Cornet M.D.   On: 01/27/2024 23:03     ASSESSMENT/PLAN:  This is a very pleasant 58 year old African-American female with stage IV (T1c, N3, M1 C) non-small cell lung cancer, adenocarcinoma.  The patient presented with a right upper lobe lung mass in addition to extensive bilateral hilar, mediastinal, and right supraclavicular lymphadenopathy.  She also has metastatic disease to the right arm and brain.  She was diagnosed in January 2025.  Her molecular studies show that she has no actionable mutations and her PD-L1 expression is 90%.  Thus far she has been treated with: 1) Craniotomy and SRS under the care of Dr. Michale Age and Dr. Lorri Rota on 10/06/23 and 10/07/23 2) Right humeral nailing by Dr. Hermina Loosen due to pathologic fracture on 10/10/23 3) 2 doses of palliative radiation to the humeral head under the care of Dr. Lurena Sally but lost to follow up in March 2025.  4) Palliative radiation to the left sacrum in 5 fractions on 12/26/23  She is currently undergoing palliative radiation to the right humerus under  the care of Dr. Lorri Rota.  The last day radiation is expected on 02/21/2024.  For systemic treatment, Dr. Marguerita Shih has recommended immunotherapy with Keytruda.  The patient has not been able to start treatment due to concerns with compliance.  The patient was seen with Dr. Marguerita Shih today.  Labs were reviewed.  Recommend that she proceed with cycle #1 today scheduled.  Will administer 0.2 mg of clonidine  for her hypertension.  I also sent her a few tablets of  Norvasc  until she can see her PCP later this week on 02/15/2024.  I gave the patient a copy of her schedule and I circled her upcoming appointments with her PCP as well as her next appointment at our clinic.  We will see her back for follow-up visit in 3 weeks for evaluation and repeat blood work before undergoing cycle #2.  I asked the patient to pick up her blood pressure medicine on the way home from the clinic today.  I also recommended that she monitor her blood pressure closely at home with a blood pressure cuff.  She was also instructed to pick up her metoprolol  from the [facility] specialty pharmacy.  Should she ever develop any hypertension and neurologic symptoms or chest pain, lightheadedness, shortness of breath, etc. she was advised to seek emergency room evaluation. She expressed understanding.    Steroid-induced facial swelling Facial swelling likely from previous steroid use. Current plan avoids steroids to ensure immunotherapy effectiveness. - Monitor for reduction in facial swelling without steroid use.  The patient was advised to call immediately if she has any concerning symptoms in the interval. The patient voices understanding of current disease status and treatment options and is in agreement with the current care plan. All questions were answered. The patient knows to call the clinic with any problems, questions or concerns. We can certainly see the patient much sooner if necessary   No orders of the defined types were  placed in this encounter.    Jadrian Bulman L Zamyah Wiesman, PA-C 02/12/24  ADDENDUM: Hematology/Oncology Attending: I had a face-to-face encounter with the patient today.  I reviewed her records, lab, and recommended her care plan.  This is a 58 years old African-American female with stage IV non-small cell lung cancer, adenocarcinoma that was diagnosed in January 2025 with no actionable mutation and positive PD-L1 expression of 90%.  She is status post craniotomy followed by SRS to the brain lesion followed by right humeral nailing for destructive pathologic fracture of the right humerus as well as palliative radiation to the left sacrum.  The patient was supposed to start systemic treatment with immunotherapy with single agent Keytruda several weeks ago but she did not show for her appointment as scheduled.  She was in the hospital recently and we again arrange for the patient to come today for the first dose of her treatment.  She is feeling fine except for the swelling of the face and lower extremity secondary to prolonged treatment with steroids. She is finally in agreement to proceed with the treatment today. I recommended for the patient to proceed with the first cycle of her treatment with immunotherapy with single agent Keytruda today.  She was not interested in a combined chemoimmunotherapy but with the high PD-L1 expression this is acceptable. She will come back for follow-up visit in 3 weeks for evaluation before starting cycle #2. The patient was advised to call immediately if she has any other concerning symptoms in the interval. The total time spent in the appointment was 30 minutes including review of chart and various tests results, discussions about plan of care and coordination of care plan .  Disclaimer: This note was dictated with voice recognition software. Similar sounding words can inadvertently be transcribed and may be missed upon review. Aurelio Blower, MD

## 2024-02-13 ENCOUNTER — Encounter: Payer: Self-pay | Admitting: *Deleted

## 2024-02-13 ENCOUNTER — Ambulatory Visit
Admission: RE | Admit: 2024-02-13 | Discharge: 2024-02-13 | Disposition: A | Discharge status: Home / Self Care | Payer: MEDICAID | Source: Ambulatory Visit | Attending: Radiation Oncology

## 2024-02-13 ENCOUNTER — Encounter: Payer: Self-pay | Admitting: Nurse Practitioner

## 2024-02-13 ENCOUNTER — Inpatient Hospital Stay: Payer: MEDICAID | Attending: Internal Medicine

## 2024-02-13 ENCOUNTER — Inpatient Hospital Stay: Payer: MEDICAID

## 2024-02-13 ENCOUNTER — Other Ambulatory Visit: Payer: Self-pay | Admitting: Physician Assistant

## 2024-02-13 ENCOUNTER — Other Ambulatory Visit: Payer: Self-pay

## 2024-02-13 ENCOUNTER — Other Ambulatory Visit (HOSPITAL_COMMUNITY): Payer: Self-pay

## 2024-02-13 ENCOUNTER — Inpatient Hospital Stay: Payer: MEDICAID | Admitting: Licensed Clinical Social Worker

## 2024-02-13 ENCOUNTER — Telehealth: Payer: Self-pay | Admitting: *Deleted

## 2024-02-13 ENCOUNTER — Inpatient Hospital Stay (HOSPITAL_BASED_OUTPATIENT_CLINIC_OR_DEPARTMENT_OTHER): Payer: MEDICAID | Admitting: Physician Assistant

## 2024-02-13 ENCOUNTER — Inpatient Hospital Stay: Payer: MEDICAID | Admitting: Nurse Practitioner

## 2024-02-13 ENCOUNTER — Encounter: Payer: Self-pay | Admitting: Internal Medicine

## 2024-02-13 VITALS — BP 192/93 | HR 65 | Temp 97.4°F | Resp 18 | Ht 66.0 in | Wt 122.3 lb

## 2024-02-13 VITALS — BP 165/95 | HR 80

## 2024-02-13 DIAGNOSIS — Z79620 Long term (current) use of immunosuppressive biologic: Secondary | ICD-10-CM | POA: Insufficient documentation

## 2024-02-13 DIAGNOSIS — C3490 Malignant neoplasm of unspecified part of unspecified bronchus or lung: Secondary | ICD-10-CM

## 2024-02-13 DIAGNOSIS — G893 Neoplasm related pain (acute) (chronic): Secondary | ICD-10-CM

## 2024-02-13 DIAGNOSIS — Z5112 Encounter for antineoplastic immunotherapy: Secondary | ICD-10-CM | POA: Diagnosis present

## 2024-02-13 DIAGNOSIS — Z79899 Other long term (current) drug therapy: Secondary | ICD-10-CM | POA: Insufficient documentation

## 2024-02-13 DIAGNOSIS — Z515 Encounter for palliative care: Secondary | ICD-10-CM | POA: Insufficient documentation

## 2024-02-13 DIAGNOSIS — I159 Secondary hypertension, unspecified: Secondary | ICD-10-CM

## 2024-02-13 DIAGNOSIS — C3411 Malignant neoplasm of upper lobe, right bronchus or lung: Secondary | ICD-10-CM | POA: Insufficient documentation

## 2024-02-13 DIAGNOSIS — C7931 Secondary malignant neoplasm of brain: Secondary | ICD-10-CM | POA: Insufficient documentation

## 2024-02-13 DIAGNOSIS — R220 Localized swelling, mass and lump, head: Secondary | ICD-10-CM | POA: Insufficient documentation

## 2024-02-13 DIAGNOSIS — I10 Essential (primary) hypertension: Secondary | ICD-10-CM | POA: Insufficient documentation

## 2024-02-13 DIAGNOSIS — C7951 Secondary malignant neoplasm of bone: Secondary | ICD-10-CM

## 2024-02-13 DIAGNOSIS — R530 Neoplastic (malignant) related fatigue: Secondary | ICD-10-CM

## 2024-02-13 LAB — RAD ONC ARIA SESSION SUMMARY
Course Elapsed Days: 5
Plan Fractions Treated to Date: 4
Plan Prescribed Dose Per Fraction: 3 Gy
Plan Total Fractions Prescribed: 10
Plan Total Prescribed Dose: 30 Gy
Reference Point Dosage Given to Date: 12 Gy
Reference Point Session Dosage Given: 3 Gy
Session Number: 4

## 2024-02-13 LAB — CBC WITH DIFFERENTIAL (CANCER CENTER ONLY)
Abs Immature Granulocytes: 0.45 10*3/uL — ABNORMAL HIGH (ref 0.00–0.07)
Basophils Absolute: 0 10*3/uL (ref 0.0–0.1)
Basophils Relative: 0 %
Eosinophils Absolute: 0 10*3/uL (ref 0.0–0.5)
Eosinophils Relative: 0 %
HCT: 35 % — ABNORMAL LOW (ref 36.0–46.0)
Hemoglobin: 12.3 g/dL (ref 12.0–15.0)
Immature Granulocytes: 4 %
Lymphocytes Relative: 9 %
Lymphs Abs: 1.1 10*3/uL (ref 0.7–4.0)
MCH: 28.9 pg (ref 26.0–34.0)
MCHC: 35.1 g/dL (ref 30.0–36.0)
MCV: 82.4 fL (ref 80.0–100.0)
Monocytes Absolute: 0.9 10*3/uL (ref 0.1–1.0)
Monocytes Relative: 7 %
Neutro Abs: 9.7 10*3/uL — ABNORMAL HIGH (ref 1.7–7.7)
Neutrophils Relative %: 80 %
Platelet Count: 731 10*3/uL — ABNORMAL HIGH (ref 150–400)
RBC: 4.25 MIL/uL (ref 3.87–5.11)
RDW: 17.7 % — ABNORMAL HIGH (ref 11.5–15.5)
WBC Count: 12.2 10*3/uL — ABNORMAL HIGH (ref 4.0–10.5)
nRBC: 0 % (ref 0.0–0.2)

## 2024-02-13 LAB — CMP (CANCER CENTER ONLY)
ALT: 10 U/L (ref 0–44)
AST: 12 U/L — ABNORMAL LOW (ref 15–41)
Albumin: 3.8 g/dL (ref 3.5–5.0)
Alkaline Phosphatase: 141 U/L — ABNORMAL HIGH (ref 38–126)
Anion gap: 9 (ref 5–15)
BUN: 15 mg/dL (ref 6–20)
CO2: 28 mmol/L (ref 22–32)
Calcium: 9.2 mg/dL (ref 8.9–10.3)
Chloride: 98 mmol/L (ref 98–111)
Creatinine: 0.65 mg/dL (ref 0.44–1.00)
GFR, Estimated: >60 mL/min (ref >60)
Glucose, Bld: 97 mg/dL (ref 70–99)
Potassium: 3.2 mmol/L — ABNORMAL LOW (ref 3.5–5.1)
Sodium: 135 mmol/L (ref 135–145)
Total Bilirubin: 0.2 mg/dL (ref 0.0–1.2)
Total Protein: 6.4 g/dL — ABNORMAL LOW (ref 6.5–8.1)

## 2024-02-13 LAB — TSH: TSH: 1.68 u[IU]/mL (ref 0.350–4.500)

## 2024-02-13 MED ORDER — SODIUM CHLORIDE 0.9 % IV SOLN
200.0000 mg | Freq: Once | INTRAVENOUS | Status: AC
  Administered 2024-02-13: 200 mg via INTRAVENOUS
  Filled 2024-02-13: qty 200

## 2024-02-13 MED ORDER — SODIUM CHLORIDE 0.9 % IV SOLN: INTRAVENOUS | Status: DC

## 2024-02-13 MED ORDER — CLONIDINE HCL 0.1 MG PO TABS
0.2000 mg | ORAL_TABLET | Freq: Once | ORAL | Status: AC
  Administered 2024-02-13: 0.2 mg via ORAL
  Filled 2024-02-13: qty 2

## 2024-02-13 MED ORDER — OXYCODONE HCL ER 15 MG PO T12A
15.0000 mg | EXTENDED_RELEASE_TABLET | Freq: Three times a day (TID) | ORAL | 0 refills | Status: DC
  Filled 2024-02-13: qty 60, 20d supply, fill #0

## 2024-02-13 MED ORDER — OXYCODONE HCL 10 MG PO TABS
10.0000 mg | ORAL_TABLET | ORAL | 0 refills | Status: DC | PRN
Start: 2024-02-13 — End: 2024-02-23
  Filled 2024-02-13: qty 60, 7d supply, fill #0

## 2024-02-13 MED ORDER — AMLODIPINE BESYLATE 5 MG PO TABS
5.0000 mg | ORAL_TABLET | Freq: Every day | ORAL | 0 refills | Status: DC
  Filled 2024-02-13: qty 7, 7d supply, fill #0

## 2024-02-13 NOTE — Progress Notes (Signed)
 Palliative Medicine [facility]  [phone] [phone]   Name: Regina [name] Date: 02/13/2024 MRN: [id number]  DOB: [birth year]  Patient Care Team: Marius Siemens, NP as PCP - General (Internal Medicine) Rosalita Combe, RN as Oncology Nurse Navigator Colie Dawes, MD as Attending Physician (Radiation Oncology)    REASON FOR CONSULTATION: Regina [name] is a 58 y.o. female with oncologic medical history including stage IV non-small cell lung cancer with bilateral hilar and supraclavicular lymphadenopathy, right arm bone involvement, and brain metastasis (09/2023) pathological fracture of right humerus status post nailing with radiation to humerus completed 11/2023. Palliative is seeing patient for symptom management and goals of care.    SOCIAL HISTORY:     reports that she quit smoking about 4 years ago. Her smoking use included cigarettes. She has never used smokeless tobacco. She reports current alcohol  use of about 6.0 standard drinks of alcohol  per week. She reports current drug use. Frequency: 4.00 times per week. Drug: Marijuana.  ADVANCE DIRECTIVES:  On file and reviewed  CODE STATUS: Full code  PAST MEDICAL HISTORY: Past Medical History:  Diagnosis Date   Allergy    Anxiety    Brain mass    Cancer (HCC)    COPD (chronic obstructive pulmonary disease) (HCC)    Emphysema of lung (HCC)    Hypertension     PAST SURGICAL HISTORY:  Past Surgical History:  Procedure Laterality Date   APPLICATION OF CRANIAL NAVIGATION Left 10/07/2023   Procedure: APPLICATION OF CRANIAL NAVIGATION;  Surgeon: Audie Bleacher, MD;  Location: [facility] OR;  Service: Neurosurgery;  Laterality: Left;   CRANIOTOMY Left 10/07/2023   Procedure: FRONTAL CRANIOTOMY FOR ABSCESS;  Surgeon: Audie Bleacher, MD;  Location: [facility] OR;  Service: Neurosurgery;  Laterality: Left;   dental procedure     HUMERUS IM NAIL Right 10/10/2023   Procedure: INTRAMEDULLARY (IM) NAIL  HUMERAL RIGHT;  Surgeon: Wilhelmenia Harada, MD;  Location: [facility] OR;  Service: Orthopedics;  Laterality: Right;    HEMATOLOGY/ONCOLOGY HISTORY:  Oncology History  Primary adenocarcinoma of upper lobe of right lung (HCC)  09/22/2023 Initial Diagnosis   Primary adenocarcinoma of upper lobe of right lung (HCC)   09/22/2023 Cancer Staging   Staging form: Lung, AJCC V9 - Clinical stage from 09/22/2023: Stage IVB (cT1c, cN3, cM1c2) - Signed by Marlene Simas, MD on 09/22/2023 Stage prefix: Initial diagnosis Method of lymph node assessment: Clinical   02/13/2024 -  Chemotherapy   Patient is on Treatment Plan : LUNG NSCLC Pembrolizumab (200) q21d       ALLERGIES:  is allergic to pork allergy and bee venom.  MEDICATIONS:  Current Outpatient Medications  Medication Sig Dispense Refill   acetaminophen  (TYLENOL ) 500 MG tablet Take 2 tablets (1,000 mg total) by mouth every 8 (eight) hours as needed for mild pain (pain score 1-3) or moderate pain (pain score 4-6). 60 tablet 0   amLODipine  (NORVASC ) 5 MG tablet Take 1 tablet (5 mg total) by mouth daily. 7 tablet 0   bisacodyl  (DULCOLAX) 10 MG suppository Place 1 suppository (10 mg total) rectally as needed for moderate constipation. 12 suppository 0   bisacodyl  (DULCOLAX) 5 MG EC tablet Take 1 tablet (5 mg total) by mouth daily as needed for moderate constipation. 30 tablet 0   dexamethasone  (DECADRON ) 2 MG tablet Take 3 tablets (6 mg total) by mouth daily. 90 tablet 0   diclofenac  Sodium (VOLTAREN ) 1 % GEL Apply 2 g topically 4 (four) times daily. (Patient  taking differently: Apply 2 g topically as needed.) 50 g 0   docusate sodium  (COLACE) 100 MG capsule Take 1 capsule (100 mg total) by mouth at bedtime. 10 capsule 0   levETIRAcetam  (KEPPRA ) 500 MG tablet Take 1 tablet (500 mg total) by mouth 2 (two) times daily. 60 tablet 1   metoprolol  tartrate (LOPRESSOR ) 25 MG tablet Take 1 tablet (25 mg total) by mouth 2 (two) times daily. 60 tablet 1   oxyCODONE   (OXYCONTIN ) 15 mg 12 hr tablet Take 1 tablet (15 mg total) by mouth every 8 (eight) hours. 60 tablet 0   Oxycodone  HCl 10 MG TABS Take 1-1.5 tablets (10-15 mg total) by mouth every 4 (four) hours as needed for up to 7 days. 60 tablet 0   pantoprazole  (PROTONIX ) 40 MG tablet Take 1 tablet (40 mg total) by mouth daily. 30 tablet 1   polyethylene glycol (MIRALAX  / GLYCOLAX ) 17 g packet Mix 1 packet (17 g) in 4-8 ounces of water and take by mouth daily. 14 each 0   potassium chloride  SA (KLOR-CON  M) 20 MEQ tablet Take 1 tablet (20 mEq total) by mouth 2 (two) times daily. 10 tablet 0   senna (SENOKOT) 8.6 MG TABS tablet Take 1 tablet (8.6 mg total) by mouth 2 (two) times daily. 30 tablet 0   senna-docusate (SENOKOT-S) 8.6-50 MG tablet Take 2 tablets by mouth at bedtime. 180 tablet 0   sodium phosphate  (FLEET) ENEM Place 133 mLs (1 enema total) rectally daily as needed for severe constipation. 230 mL 12   No current facility-administered medications for this visit.    VITAL SIGNS: LMP 06/06/2010  There were no vitals filed for this visit.  Estimated body mass index is 19.74 kg/m as calculated from the following:   Height as of an earlier encounter on 02/13/24: 5\' 6"  (1.676 m).   Weight as of an earlier encounter on 02/13/24: 122 lb 4.8 oz (55.5 kg).  LABS: CBC:    Component Value Date/Time   WBC 12.2 (H) 02/13/2024 0722   WBC 11.1 (H) 02/08/2024 0545   HGB 12.3 02/13/2024 0722   HGB 13.5 10/19/2017 0942   HCT 35.0 (L) 02/13/2024 0722   HCT 38.9 10/19/2017 0942   PLT 731 (H) 02/13/2024 0722   PLT 550 (H) 10/19/2017 0942   MCV 82.4 02/13/2024 0722   MCV 86 10/19/2017 0942   NEUTROABS 9.7 (H) 02/13/2024 0722   NEUTROABS 4.3 10/19/2017 0942   LYMPHSABS 1.1 02/13/2024 0722   LYMPHSABS 2.0 10/19/2017 0942   MONOABS 0.9 02/13/2024 0722   EOSABS 0.0 02/13/2024 0722   EOSABS 0.1 10/19/2017 0942   BASOSABS 0.0 02/13/2024 0722   BASOSABS 0.1 10/19/2017 0942   Comprehensive Metabolic Panel:     Component Value Date/Time   NA 135 02/13/2024 0722   NA 130 (L) 10/19/2017 0942   K 3.2 (L) 02/13/2024 0722   CL 98 02/13/2024 0722   CO2 28 02/13/2024 0722   BUN 15 02/13/2024 0722   BUN 4 (L) 10/19/2017 0942   CREATININE 0.65 02/13/2024 0722   GLUCOSE 97 02/13/2024 0722   CALCIUM  9.2 02/13/2024 0722   AST 12 (L) 02/13/2024 0722   ALT 10 02/13/2024 0722   ALKPHOS 141 (H) 02/13/2024 0722   BILITOT 0.2 02/13/2024 0722   PROT 6.4 (L) 02/13/2024 0722   PROT 7.6 10/19/2017 0942   ALBUMIN 3.8 02/13/2024 0722   ALBUMIN 4.6 10/19/2017 0942    RADIOGRAPHIC STUDIES: DG Humerus Left Result Date: 02/06/2024 CLINICAL  DATA:  Intractable pain. Non-small cell lung cancer with metastatic disease. EXAM: LEFT HUMERUS - 2+ VIEW COMPARISON:  None Available. FINDINGS: Slight cortical thinning in the lateral aspect of the mid humeral shaft which may represent an underlying lucent lesion. No pathologic fracture. Shoulder and elbow alignment are maintained. Unremarkable soft tissues. IMPRESSION: Slight cortical thinning in the lateral aspect of the mid humeral shaft which may represent an underlying lucent lesion. No pathologic fracture. Electronically Signed   By: Chadwick Colonel M.D.   On: 02/06/2024 20:50   DG Humerus Right Result Date: 02/06/2024 CLINICAL DATA:  Intractable pain. Metastatic non-small cell lung cancer. EXAM: RIGHT HUMERUS - 2+ VIEW COMPARISON:  Radiograph 12/19/2023 FINDINGS: Humeral intramedullary nail with proximal and distal locking screw fixation. The distal locking screw extends into the soft tissues by 13 mm, unchanged from most recent prior exam. There is slight increase in adjacent periosteal reaction. Previous lucency in the mid humeral shaft has diminished with increasing bone stock. There is some periosteal reaction about the proximal aspect of the lesion. IMPRESSION: 1. Humeral intramedullary nail with proximal and distal locking screw fixation. The distal locking screw extends  into the soft tissues by 13 mm, unchanged from most recent prior exam. There is increase in periosteal reaction which may be due to motion. 2. Previous lucent lesion in the mid humeral shaft has diminished with increasing bone stock. There is some periosteal reaction about the proximal aspect of the lesion. Electronically Signed   By: Chadwick Colonel M.D.   On: 02/06/2024 20:48   DG Chest 2 View Result Date: 02/06/2024 CLINICAL DATA:  Shortness of breath with chest pain EXAM: CHEST - 2 VIEW COMPARISON:  Chest x-ray 01/31/2024.  CT of the chest 01/28/2024. FINDINGS: TAVR attic changes in the lung bases appear unchanged. Nodular density in the right lung apex is unchanged. There is no new focal lung infiltrate, pleural effusion or pneumothorax. The cardiomediastinal silhouette is within normal limits. IMPRESSION: 1. No active cardiopulmonary disease. 2. Stable nodular density in the right lung apex. Electronically Signed   By: Tyron Gallon M.D.   On: 02/06/2024 17:50   DG Chest Portable 1 View Result Date: 02/01/2024 CLINICAL DATA:  Chest pain and shortness of breath. EXAM: PORTABLE CHEST 1 VIEW COMPARISON:  Jan 27, 2024 FINDINGS: The heart size and mediastinal contours are within normal limits. Mild to moderate severity calcification of the aortic arch is seen. There is evidence of emphysematous lung disease, chronic appearing increased interstitial lung markings and bilateral areas of pulmonary fibrosis. There is stable biapical pleural thickening, scarring and/or atelectasis with hazy opacification along the lateral aspect of the right apex in the area of the patient's known right apical mass. No pleural effusion or pneumothorax is identified. A radiopaque intramedullary rod and proximal fixation screws are seen within the right humerus. An ill-defined lytic area is also seen along the medial aspect of the mid right humeral shaft. IMPRESSION: COPD and findings which likely correspond to the patient's known  lateral right apical mass. Electronically Signed   By: Virgle Grime M.D.   On: 02/01/2024 00:12   CT Angio Chest PE W and/or Wo Contrast Result Date: 01/28/2024 CLINICAL DATA:  Pulmonary embolism (PE) suspected, high prob. Chest pain EXAM: CT ANGIOGRAPHY CHEST WITH CONTRAST TECHNIQUE: Multidetector CT imaging of the chest was performed using the standard protocol during bolus administration of intravenous contrast. Multiplanar CT image reconstructions and MIPs were obtained to evaluate the vascular anatomy. RADIATION DOSE REDUCTION: This exam was  performed according to the departmental dose-optimization program which includes automated exposure control, adjustment of the mA and/or kV according to patient size and/or use of iterative reconstruction technique. CONTRAST:  75mL OMNIPAQUE  IOHEXOL  350 MG/ML SOLN COMPARISON:  CT angio chest 12/12/2023, PET CT 10/27/2023 FINDINGS: Cardiovascular: Satisfactory opacification of the pulmonary arteries to the segmental level. No evidence of pulmonary embolism. The main pulmonary artery is normal in caliber. Normal heart size. No significant pericardial effusion. The thoracic aorta is normal in caliber. No atherosclerotic plaque of the thoracic aorta. No coronary artery calcifications. Mediastinum/Nodes: Redemonstration of right hilar lymphadenopathy with as an example a 1.1 cm lymph node (7:162). Redemonstration of right axillary lymphadenopathy. As an example a 1.3 cm lymph node (5:30). No enlarged mediastinal, left hilar, or axillary lymph nodes. Thyroid  gland, trachea, and esophagus demonstrate no significant findings. Lungs/Pleura: Paraseptal and centrilobular moderate emphysema. Bibasilar reticulations and lingular and lower lobe honeycombing. Interval decrease in size of a 2.3 x 1.6 cm (from 2.7 x 1.9 cm) right apical nodular opacity. Persistent biapical pleural/pulmonary scarring. No pulmonary mass. Trace right apical loculated pleural effusion. No pneumothorax.  Upper Abdomen: No acute abnormality. Musculoskeletal: No chest wall abnormality. Interval increase in size of a manubrial lytic lesion with associated 3.4 x 2.1 cm soft tissue density (9:72, 5:32). No acute displaced fracture. Multilevel degenerative changes of the spine. Review of the MIP images confirms the above findings. IMPRESSION: 1. No pulmonary embolus. 2. Interval decrease in size of a 2.3 x 1.6 cm (from 2.7 x 1.9 cm) right apical nodular opacity consistent with known malignancy. 3. Persistent right hilar and axillary lymphadenopathy. 4. Interval increase in size of a metastatic osseous manubrial lytic lesion with associated 3.4 x 2.1 cm soft tissue density. 5. Trace right apical loculated pleural effusion. 6. Pulmonary fibrosis/interstitial lung disease as well as emphysema. 7. Aortic Atherosclerosis (ICD10-I70.0) and Emphysema (ICD10-J43.9). Electronically Signed   By: Morgane  Naveau M.D.   On: 01/28/2024 01:38   DG Chest 2 View Result Date: 01/27/2024 CLINICAL DATA:  Chest pain EXAM: CHEST - 2 VIEW COMPARISON:  Radiograph and CT 12/12/2023 FINDINGS: Stable cardiomediastinal silhouette. Aortic atherosclerotic calcification. Chronic interstitial lung disease with biapical pleuroparenchymal scarring, bronchitic changes, and reticular opacities in the lower lungs. The known right apical mass is redemonstrated and better evaluated on CT. No pleural effusion or pneumothorax. No displaced rib fracture. Lytic lesion in the right mid humerus. IMPRESSION: Chronic interstitial lung disease similar to 12/12/2023. Known right apical mass is redemonstrated. Lytic lesion in the right mid humerus. Electronically Signed   By: Rozell Cornet M.D.   On: 01/27/2024 23:03    PERFORMANCE STATUS (ECOG) : 1 - Symptomatic but completely ambulatory  Review of Systems  Constitutional:  Positive for activity change, appetite change and fatigue.  Cardiovascular:  Positive for leg swelling.  Unless otherwise noted, a  complete review of systems is negative.  Physical Exam General: NAD Cardiovascular: regular rate and rhythm Pulmonary: clear ant fields Abdomen: soft, nontender, + bowel sounds Extremities: bilateral ankle edema, right arm in sling  Skin: no rashes Neurological: Alert and oriented x3  IMPRESSION: Discussed the use of AI scribe software for clinical note transcription with the patient, who gave verbal consent to proceed.  Ms. Prestridge was seen during her infusion for initial palliative visit. Patient has been scheduled multiple times to establish care however did not make it to any of the appointments. Recently hospitalized due to intractable pain and seen by our palliative team inpatient. No family present.  Patient alert and able to engage in discussions with some speech impairment.   I introduced myself, [place] RN, and Palliative's role in collaboration with the oncology team. Concept of Palliative Care was introduced as specialized medical care for people and their families living with serious illness.  It focuses on providing relief from the symptoms and stress of a serious illness.  The goal is to improve quality of life for both the patient and the family. Values and goals of care important to patient and family were attempted to be elicited.   Jalaina reports that she is now living with her aunt for additional support. She and family cares for her disabled brother as well. 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   At home patient reports she is able to perform most ADLs independently however with some limitations due to fatigue and pain. Appetite fluctuates some days are better than others. Weight is 122lbs.   Patient reports her biggest concern is making sure she has all of her medications and controlling her pain. We discussed her pain at length. She is complaining of pain in right arm, back, and chest wall area at times. Describes pain as  sharp, stabbing, and sudden. Reports current pain regimen is providing her with some relief however still with some pain. Her current regimen consist of Oxycontin  15mg  every 8 hours and oxycodone  IR 10-15mg  every 4 hours. She does not require use every 4 hours and since inclusion of extended release can go up to 5-6 hours at times.   Extensive medication, chart, and social history review completed. Patient denies any alcohol  or illicit drug use. Extensive discussions and explanation of palliative's role in collaboration with his oncology team to assist in his pain and symptom management. Education provided on criteria for ongoing pain support via palliative team including adhering to appointments. She verbalized understanding.   Goals of Care We discussed her current illness and what it means in the larger context of her on-going co-morbidities. Natural disease trajectory and expectations were discussed. Patient verbalizes understanding of overall condition. States she is hopeful she will improve but continues to be faced with health challenges.   I empathetically approached discussions regarding healthcare limitations, CODE STATUS, and advanced directives.  Patient has a documented advanced directive on file naming her niece Humberto Magnus as her healthcare decision-maker in the event she is unable to speak for herself.  Being independent with a decent quality of life is most important to her.  Patient request to remain a full code at this time.  Ms. Gajda is clear in her expressed wishes to continue to treat the treatable aggressively allow her every opportunity to continue to show improvement or stability while managing her symptoms aggressively.  I discussed the importance of continued conversation with family and their medical providers regarding overall plan of care and treatment options, ensuring decisions are within the context of the patients values and GOCs.  PLAN: Established therapeutic  relationship. Education provided on palliative's role in collaboration with their Oncology/Radiation team.  Cancer related pain Patient reports her pain is well-controlled on current regimen.  Extensive discussion provided on palliative role in her pain management and the importance of adhering to follow-up visits in order to maintain proper management of her pain and medication refills. - Continue OxyContin  15 mg every 8 hours.  Will provide refill to last until follow-up appointment. - Continue oxycodone  IR 10 days 15 mg every 4 hours as  needed for moderate-severe, or breakthrough pain. - Continue MiraLAX  for bowel regimen - Continue Senokot 2 tablets at bedtime for bowel regimen  Goals of care Encouraged ongoing discussions regarding goals of care and CODE STATUS.  Patient has a documented advanced directive on file with no desire to changes. - Patient is clear her expressed wishes to continue to treat the treatable allow her every opportunity to continue to show stability while aggressively managing her symptoms.  She desires no suffering.  Her quality of life is most important to her. - Advanced directives on file.  Her niece Humberto Magnus is her documented medical decision-maker. - Full code  I will plan to see patient back in 2-3 weeks.  Sooner if needed.   Patient expressed understanding and was in agreement with this plan. She also understands that She can call the clinic at any time with any questions, concerns, or complaints.   Thank you for your referral and allowing Palliative to assist in overall I will overhear [place] now I think it gave them all out Ms. Marysue Slevin's care.   Number and complexity of problems addressed: HIGH - 1 or more chronic illnesses with SEVERE exacerbation, progression, or side effects of treatment - advanced cancer, pain. Any controlled substances utilized were prescribed in the context of palliative care.  Visit consisted of counseling and education  dealing with the complex and emotionally intense issues of symptom management and palliative care in the setting of serious and potentially life-threatening illness.  Signed by: Dellia Ferguson, AGPCNP-BC Palliative Medicine Team/[facility]

## 2024-02-13 NOTE — Patient Instructions (Signed)

## 2024-02-13 NOTE — Progress Notes (Signed)
 Ok to proceed with Keytruda today with current Dex rx.  Jobe Mulder [place], [place], BCPS, BCOP 02/13/2024 9:32 AM

## 2024-02-13 NOTE — Progress Notes (Signed)
 DME order for cane placed with [employer] per MD request.

## 2024-02-13 NOTE — Transitions of Care (Post Inpatient/ED Visit) (Signed)
   02/13/2024  Name: Regina [name] MRN: [id number] DOB: 06/14/1966  Today's TOC FU Call Status: Today's TOC FU Call Status:: Unsuccessful Call (2nd Attempt) Unsuccessful Call (2nd Attempt) Date: 02/13/24  Attempted to reach the patient regarding the most recent Inpatient/ED visit.  Follow Up Plan: Additional outreach attempts will be made to reach the patient to complete the Transitions of Care (Post Inpatient/ED visit) call.   Arna Better RN, BSN [place]  [facility] RN Care Manager [phone]

## 2024-02-14 ENCOUNTER — Telehealth (INDEPENDENT_AMBULATORY_CARE_PROVIDER_SITE_OTHER): Payer: Self-pay | Admitting: Primary Care

## 2024-02-14 ENCOUNTER — Other Ambulatory Visit (HOSPITAL_COMMUNITY): Payer: Self-pay

## 2024-02-14 ENCOUNTER — Telehealth: Payer: Self-pay | Admitting: *Deleted

## 2024-02-14 ENCOUNTER — Encounter: Payer: Self-pay | Admitting: Internal Medicine

## 2024-02-14 LAB — T4: T4, Total: 2.9 ug/dL — ABNORMAL LOW (ref 4.5–12.0)

## 2024-02-14 NOTE — Progress Notes (Signed)
 [facility] CSW Progress Note  Visual merchandiser met with patient in infusion to check in.  Pt reports she is presently doing well.  Per pt she continues to reside w/ her brother and provide his care.  Pt's son also reportedly living primarily w/ pt and is providing transportation to and from appts.  Pt given a food bag from the [facility] pantry as well as contact information for CSW to assist w/ any needs that may arise.  Pt requesting a cane.  RN informed w/ request to have cane delivered to pt's home.  CSW to remain available throughout duration of treatment to provide support as appropriate.      Quenton Bruns, LCSW Clinical Social Worker [facility]    Patient is participating in a Managed Medicaid Plan:  Yes

## 2024-02-14 NOTE — Transitions of Care (Post Inpatient/ED Visit) (Signed)
   02/14/2024  Name: Regina [name] MRN: [id number] DOB: 1965/11/07  Today's TOC FU Call Status: Today's TOC FU Call Status:: Unsuccessful Call (3rd Attempt) Unsuccessful Call (3rd Attempt) Date: 02/14/24  Attempted to reach the patient regarding the most recent Inpatient/ED visit.  Follow Up Plan: No further outreach attempts will be made at this time. We have been unable to contact the patient.  Arna Better RN, BSN [place]  [facility] RN Care Manager [phone]

## 2024-02-14 NOTE — Telephone Encounter (Signed)
 Called to confirm appt. Pt will be present

## 2024-02-15 ENCOUNTER — Ambulatory Visit (INDEPENDENT_AMBULATORY_CARE_PROVIDER_SITE_OTHER): Admitting: Primary Care

## 2024-02-16 ENCOUNTER — Inpatient Hospital Stay
Admission: RE | Admit: 2024-02-16 | Discharge: 2024-02-16 | Disposition: A | Discharge status: Home / Self Care | Payer: Self-pay | Source: Ambulatory Visit | Attending: Radiation Oncology | Admitting: Radiation Oncology

## 2024-02-16 ENCOUNTER — Other Ambulatory Visit: Payer: Self-pay

## 2024-02-16 ENCOUNTER — Emergency Department (HOSPITAL_COMMUNITY)
Admission: EM | Admit: 2024-02-16 | Discharge: 2024-02-16 | Disposition: A | Discharge status: Home / Self Care | Payer: MEDICAID

## 2024-02-16 ENCOUNTER — Encounter (HOSPITAL_COMMUNITY): Payer: Self-pay

## 2024-02-16 DIAGNOSIS — I10 Essential (primary) hypertension: Secondary | ICD-10-CM | POA: Diagnosis not present

## 2024-02-16 DIAGNOSIS — J449 Chronic obstructive pulmonary disease, unspecified: Secondary | ICD-10-CM | POA: Diagnosis not present

## 2024-02-16 DIAGNOSIS — C3490 Malignant neoplasm of unspecified part of unspecified bronchus or lung: Secondary | ICD-10-CM | POA: Insufficient documentation

## 2024-02-16 DIAGNOSIS — Z5112 Encounter for antineoplastic immunotherapy: Secondary | ICD-10-CM | POA: Diagnosis not present

## 2024-02-16 DIAGNOSIS — E876 Hypokalemia: Secondary | ICD-10-CM | POA: Diagnosis not present

## 2024-02-16 DIAGNOSIS — Z79899 Other long term (current) drug therapy: Secondary | ICD-10-CM | POA: Diagnosis not present

## 2024-02-16 DIAGNOSIS — M7989 Other specified soft tissue disorders: Secondary | ICD-10-CM | POA: Diagnosis present

## 2024-02-16 LAB — COMPREHENSIVE METABOLIC PANEL WITH GFR
ALT: 18 U/L (ref 0–44)
AST: 30 U/L (ref 15–41)
Albumin: 3.1 g/dL — ABNORMAL LOW (ref 3.5–5.0)
Alkaline Phosphatase: 124 U/L (ref 38–126)
Anion gap: 12 (ref 5–15)
BUN: 8 mg/dL (ref 6–20)
CO2: 24 mmol/L (ref 22–32)
Calcium: 9.3 mg/dL (ref 8.9–10.3)
Chloride: 100 mmol/L (ref 98–111)
Creatinine, Ser: 0.51 mg/dL (ref 0.44–1.00)
GFR, Estimated: >60 mL/min (ref >60)
Glucose, Bld: 88 mg/dL (ref 70–99)
Potassium: 3.1 mmol/L — ABNORMAL LOW (ref 3.5–5.1)
Sodium: 136 mmol/L (ref 135–145)
Total Bilirubin: 0.5 mg/dL (ref 0.0–1.2)
Total Protein: 6 g/dL — ABNORMAL LOW (ref 6.5–8.1)

## 2024-02-16 LAB — CBC WITH DIFFERENTIAL/PLATELET
Abs Immature Granulocytes: 0.75 10*3/uL — ABNORMAL HIGH (ref 0.00–0.07)
Basophils Absolute: 0 10*3/uL (ref 0.0–0.1)
Basophils Relative: 0 %
Eosinophils Absolute: 0.1 10*3/uL (ref 0.0–0.5)
Eosinophils Relative: 1 %
HCT: 37 % (ref 36.0–46.0)
Hemoglobin: 12.1 g/dL (ref 12.0–15.0)
Immature Granulocytes: 8 %
Lymphocytes Relative: 17 %
Lymphs Abs: 1.7 10*3/uL (ref 0.7–4.0)
MCH: 28.6 pg (ref 26.0–34.0)
MCHC: 32.7 g/dL (ref 30.0–36.0)
MCV: 87.5 fL (ref 80.0–100.0)
Monocytes Absolute: 1.2 10*3/uL — ABNORMAL HIGH (ref 0.1–1.0)
Monocytes Relative: 13 %
Neutro Abs: 6 10*3/uL (ref 1.7–7.7)
Neutrophils Relative %: 61 %
Platelets: 605 10*3/uL — ABNORMAL HIGH (ref 150–400)
RBC: 4.23 MIL/uL (ref 3.87–5.11)
RDW: 18.3 % — ABNORMAL HIGH (ref 11.5–15.5)
WBC: 9.8 10*3/uL (ref 4.0–10.5)
nRBC: 0 % (ref 0.0–0.2)

## 2024-02-16 LAB — RAD ONC ARIA SESSION SUMMARY
Course Elapsed Days: 8
Plan Fractions Treated to Date: 5
Plan Prescribed Dose Per Fraction: 3 Gy
Plan Total Fractions Prescribed: 10
Plan Total Prescribed Dose: 30 Gy
Reference Point Dosage Given to Date: 15 Gy
Reference Point Session Dosage Given: 3 Gy
Session Number: 5

## 2024-02-16 MED ORDER — POTASSIUM CHLORIDE CRYS ER 20 MEQ PO TBCR
40.0000 meq | EXTENDED_RELEASE_TABLET | Freq: Once | ORAL | Status: AC
  Administered 2024-02-16: 40 meq via ORAL
  Filled 2024-02-16: qty 2

## 2024-02-16 MED ORDER — OXYCODONE HCL 5 MG PO TABS
10.0000 mg | ORAL_TABLET | Freq: Once | ORAL | Status: AC
  Administered 2024-02-16: 10 mg via ORAL
  Filled 2024-02-16: qty 2

## 2024-02-16 MED ORDER — AMLODIPINE BESYLATE 5 MG PO TABS
5.0000 mg | ORAL_TABLET | Freq: Once | ORAL | Status: AC
  Administered 2024-02-16: 5 mg via ORAL
  Filled 2024-02-16: qty 1

## 2024-02-16 NOTE — ED Notes (Signed)
 Patient able to ambulate a few steps and able to get up to a bedside commode

## 2024-02-16 NOTE — ED Notes (Addendum)
 Patient has an appointment at 1pm at cancer center will hold in room til after 12pm to take over to cancer center  States she has a ride home

## 2024-02-16 NOTE — Discharge Instructions (Addendum)
 Please expect a phone call from [facility] - your Tailored Care Management support. Your care manager will be able to connect you to community supports and resources that are in network with your insurance, [employer].   Compression stockings will help with the swelling that is a side effect from your immunotherapy treatment. Follow up with your primary care and oncology provider. Seek emergency care if experiencing any new or worsening symptoms.

## 2024-02-16 NOTE — ED Provider Notes (Signed)
 [name] EMERGENCY DEPARTMENT AT [facility] Provider Note   CSN: [id number] Arrival date & time: 02/16/24  4098     History  Chief Complaint  Patient presents with   Leg Swelling    Regina [name] is a 58 y.o. female with PMHx anxiety, COPD, HTN, metastatic NSCLC who presents to ED concerned for BL LE swelling that started yesterday. Patient stating that the swelling is causing her feet to hurt. Patient has not taken any medication for pain today. Patient stating that her last immunotherapy treatment was 2 days ago. Patient denies trauma to her feet. 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  Denies fever, dyspnea, nausea, vomiting, diarrhea, dysuria, hematuria.  Patient with Oncology appointment at 1:30PM today.   HPI     Home Medications Prior to Admission medications   Medication Sig Start Date End Date Taking? Authorizing Provider  acetaminophen  (TYLENOL ) 500 MG tablet Take 2 tablets (1,000 mg total) by mouth every 8 (eight) hours as needed for mild pain (pain score 1-3) or moderate pain (pain score 4-6). 02/01/24   Regina Bile, [name]  amLODipine  (NORVASC ) 5 MG tablet Take 1 tablet (5 mg total) by mouth daily. 02/13/24   Regina [name]  bisacodyl  (DULCOLAX) 10 MG suppository Place 1 suppository (10 mg total) rectally as needed for moderate constipation. 02/09/24   Regina Dacosta, [name]  bisacodyl  (DULCOLAX) 5 MG EC tablet Take 1 tablet (5 mg total) by mouth daily as needed for moderate constipation. 02/09/24   Regina [name]  dexamethasone  (DECADRON ) 2 MG tablet Take 3 tablets (6 mg total) by mouth daily. 02/10/24   Regina [name]  diclofenac  Sodium (VOLTAREN ) 1 % GEL Apply 2 g topically 4 (four) times daily. Patient taking differently: Apply 2 g topically as needed. 12/10/23   Regina [name]  docusate sodium  (COLACE) 100 MG capsule Take 1 capsule (100 mg total)  by mouth at bedtime. 02/09/24   Regina [name]  levETIRAcetam  (KEPPRA ) 500 MG tablet Take 1 tablet (500 mg total) by mouth 2 (two) times daily. 12/15/23   Regina [name]  metoprolol  tartrate (LOPRESSOR ) 25 MG tablet Take 1 tablet (25 mg total) by mouth 2 (two) times daily. 12/15/23   Regina [name]  oxyCODONE  (OXYCONTIN ) 15 mg 12 hr tablet Take 1 tablet (15 mg total) by mouth every 8 (eight) hours. 02/13/24   Regina [name]  Oxycodone  HCl 10 MG TABS Take 1-1.5 tablets (10-15 mg total) by mouth every 4 (four) hours as needed for up to 7 days. 02/13/24 02/21/24  Regina [name]  pantoprazole  (PROTONIX ) 40 MG tablet Take 1 tablet (40 mg total) by mouth daily. 12/15/23 12/14/24  Regina [name]  polyethylene glycol (MIRALAX  / GLYCOLAX ) 17 g packet Mix 1 packet (17 g) in 4-8 ounces of water and take by mouth daily. 12/27/23   Regina Leather, [name]  potassium chloride  SA (KLOR-CON  M) 20 MEQ tablet Take 1 tablet (20 mEq total) by mouth 2 (two) times daily. 01/16/24   Regina Simas, [name]  senna (SENOKOT) 8.6 MG TABS tablet Take 1 tablet (8.6 mg total) by mouth 2 (two) times daily. 12/27/23   Regina Leather, [name]  senna-docusate (SENOKOT-S) 8.6-50 MG tablet Take 2 tablets by mouth at bedtime. 02/09/24   Regina [name]  sodium phosphate  (FLEET) ENEM Place 133 mLs (1 enema total) rectally daily as needed for severe  constipation. 02/09/24   Regina [name]      Allergies    Pork allergy and Bee venom    Review of Systems   Review of Systems  Musculoskeletal:        Feet swelling    Physical Exam Updated Vital Signs BP (!) 163/84   Pulse (!) 58   Temp 98.2 F (36.8 C) (Oral)   Resp 16   LMP 06/06/2010   SpO2 96%  Physical Exam Vitals and nursing note reviewed.  Constitutional:      General: She is not in acute distress.    Appearance: She is not ill-appearing or toxic-appearing.  HENT:     Head: Normocephalic and atraumatic.     Mouth/Throat:      Mouth: Mucous membranes are moist.   Eyes:     General: No scleral icterus.       Right eye: No discharge.        Left eye: No discharge.     Conjunctiva/sclera: Conjunctivae normal.    Cardiovascular:     Rate and Rhythm: Normal rate and regular rhythm.     Pulses: Normal pulses.     Heart sounds: Normal heart sounds. No murmur heard. Pulmonary:     Effort: Pulmonary effort is normal. No respiratory distress.     Breath sounds: Normal breath sounds. No wheezing, rhonchi or rales.  Abdominal:     General: Abdomen is flat.   Musculoskeletal:     Right lower leg: No edema.     Left lower leg: No edema.     Comments: Moderate non-pitting swelling of BL feet without erythema or increased warmth. +2 pedal pulses. Area non-tense.   Skin:    General: Skin is warm and dry.     Findings: No rash.   Neurological:     General: No focal deficit present.     Mental Status: She is alert and oriented to person, place, and time. Mental status is at baseline.   Psychiatric:        Mood and Affect: Mood normal.     ED Results / Procedures / Treatments   Labs (all labs ordered are listed, but only abnormal results are displayed) Labs Reviewed  CBC WITH DIFFERENTIAL/PLATELET - Abnormal; Notable for the following components:      Result Value   RDW 18.3 (*)    Platelets 605 (*)    Monocytes Absolute 1.2 (*)    Abs Immature Granulocytes 0.75 (*)    All other components within normal limits  COMPREHENSIVE METABOLIC PANEL WITH GFR - Abnormal; Notable for the following components:   Potassium 3.1 (*)    Total Protein 6.0 (*)    Albumin 3.1 (*)    All other components within normal limits    EKG EKG Interpretation Date/Time:  Thursday February 16 2024 08:01:06 EDT Ventricular Rate:  86 PR Interval:  148 QRS Duration:  78 QT Interval:  329 QTC Calculation: 394 R Axis:   17  Text Interpretation: Sinus rhythm Atrial premature complexes Left atrial enlargement Nonspecific T  abnormalities, lateral leads Confirmed by Regina [name] [phone]) on 02/16/2024 8:03:20 AM  Radiology No results found.  Procedures Procedures    Medications Ordered in ED Medications  amLODipine  (NORVASC ) tablet 5 mg (has no administration in time range)  oxyCODONE  (Oxy IR/ROXICODONE ) immediate release tablet 10 mg (10 mg Oral Given 02/16/24 0749)  potassium chloride  SA (KLOR-CON  M) CR tablet 40 mEq (40 mEq Oral Given 02/16/24 0842)  ED Course/ Medical Decision Making/ A&P                                 Medical Decision Making Amount and/or Complexity of Data Reviewed Labs: ordered.  Risk Prescription drug management.   This patient presents to the ED for concern of feet swelling, this involves an extensive number of treatment options, and is a complaint that carries with it a high risk of complications and morbidity.  The differential diagnosis includes treatment side effect, AKI, CHF, DVT, cellulitis, trauma   Co morbidities that complicate the patient evaluation  anxiety, COPD, HTN, metastatic NSCLC   Additional history obtained:  Additional history obtained from 02/13/2024 Oncology note:   CURRENT THERAPY:   1) Pembrolizumab 200 Mg IV every 3 weeks.  First dose 02/13/24  2) Palliative radiation to the right humeral shaft under the care of Dr. Lorri Rota. Last dose expected on 02/21/24    Problem List / ED Course / Critical interventions / Medication management  Patient presents to ED concern for BL feet swelling since yesterday.  Patient's first dose of her immunotherapy was 6/9.  Patient denies any infectious symptoms recently.  Denies anginal chest pain or SOB.  Physical exam with moderate nonpitting swelling of BL feet.  Rest of physical exam reassuring.  Patient afebrile with stable vitals. Patient has not taken any pain medication for her bone met pain today - I provided her with Oxycodone  in ED which did help pain.  I Ordered, and personally interpreted labs.  CBC  without leukocytosis or anemia.  CMP with mild hypokalemia at 3.1.  Patient provided with oral potassium supplementation. The patient was maintained on a cardiac monitor.  I personally viewed and interpreted the cardiac monitored which showed an underlying rhythm of: Sinus rhythm Shared all results with patient. Answered all questions. It appears that patient's symptoms today are d/t her new immunotherapy. I recommended compression stockings and following up with her oncologist. Patient verbalized understanding of plan.  Patient becoming tearful stating that she starting to have to rely too much on her son given her cancer treatment side effects. Patient would like to rely less on son since he has his own job. I will reach out to social work to see if we can set patient up with any extra help at home. SW was able to reach out to patient's care manager who will be reaching out to patient about further opportunities for better assistance at home.  Patient's oncology appointment is in a couple of hours. She does not have a car and is requesting to stay in ED until her oncology appointment where she states that she will have someone pick her up after the appointment. I feel that this is appropriate. Patient requesting her home BP medication which I have ordered.  I have reviewed the patients home medicines and have made adjustments as needed The patient has been appropriately medically screened and/or stabilized in the ED. I have low suspicion for any other emergent medical condition which would require further screening, evaluation or treatment in the ED or require inpatient management. At time of discharge the patient is hemodynamically stable and in no acute distress. I have discussed work-up results and diagnosis with patient and answered all questions. Patient is agreeable with discharge plan. We discussed strict return precautions for returning to the emergency department and they verbalized understanding.      Ddx: These are considered  less likely d/t HPI and physical exam - AKI: CMP reassuring - CHF: patient denies anginal chest pain, SOB, and physical exam is reassuring  - DVT: swelling was not unilateral  - cellulitis: physical exam reassuring   Social Determinants of Health:  Cancer patient         Final Clinical Impression(s) / ED Diagnoses Final diagnoses:  Leg swelling    Rx / DC Orders ED Discharge Orders     None         [place], [place] 02/16/24 0955    Rolinda Climes, DO 02/16/24 1005

## 2024-02-16 NOTE — ED Triage Notes (Signed)
 Pt has bilateral lower leg swelling into feet. Pt reports this started yesterday all of the sudden. Pt cancer pt receiving chemo currently.

## 2024-02-16 NOTE — Progress Notes (Signed)
 CSW received secure chat from PA inquiring about HH vs SNF due to pt feeling that she is relying on her son too much. Pt's insurance does not have SNF benefits.   CSW outreached to [employer] to inquire about pt having a [facility]. Per ED hospital liaison, pt is followed by [facility] for Tailored Care Management services.

## 2024-02-17 ENCOUNTER — Ambulatory Visit: Payer: MEDICAID

## 2024-02-21 ENCOUNTER — Emergency Department (HOSPITAL_COMMUNITY): Payer: MEDICAID

## 2024-02-21 ENCOUNTER — Inpatient Hospital Stay (HOSPITAL_COMMUNITY)
Admission: EM | Admit: 2024-02-21 | Discharge: 2024-02-23 | DRG: 055 | Disposition: A | Discharge status: Home Health | Payer: MEDICAID | Attending: Internal Medicine | Admitting: Internal Medicine

## 2024-02-21 ENCOUNTER — Ambulatory Visit: Payer: MEDICAID

## 2024-02-21 ENCOUNTER — Encounter (HOSPITAL_COMMUNITY): Payer: Self-pay

## 2024-02-21 ENCOUNTER — Other Ambulatory Visit: Payer: Self-pay

## 2024-02-21 DIAGNOSIS — C3490 Malignant neoplasm of unspecified part of unspecified bronchus or lung: Secondary | ICD-10-CM

## 2024-02-21 DIAGNOSIS — R4701 Aphasia: Secondary | ICD-10-CM | POA: Diagnosis present

## 2024-02-21 DIAGNOSIS — G893 Neoplasm related pain (acute) (chronic): Secondary | ICD-10-CM

## 2024-02-21 DIAGNOSIS — Z79899 Other long term (current) drug therapy: Secondary | ICD-10-CM

## 2024-02-21 DIAGNOSIS — C3491 Malignant neoplasm of unspecified part of right bronchus or lung: Secondary | ICD-10-CM | POA: Diagnosis present

## 2024-02-21 DIAGNOSIS — Z681 Body mass index (BMI) 19 or less, adult: Secondary | ICD-10-CM

## 2024-02-21 DIAGNOSIS — Z923 Personal history of irradiation: Secondary | ICD-10-CM

## 2024-02-21 DIAGNOSIS — Z806 Family history of leukemia: Secondary | ICD-10-CM

## 2024-02-21 DIAGNOSIS — M7989 Other specified soft tissue disorders: Secondary | ICD-10-CM

## 2024-02-21 DIAGNOSIS — I10 Essential (primary) hypertension: Secondary | ICD-10-CM | POA: Diagnosis present

## 2024-02-21 DIAGNOSIS — Z87891 Personal history of nicotine dependence: Secondary | ICD-10-CM

## 2024-02-21 DIAGNOSIS — J811 Chronic pulmonary edema: Secondary | ICD-10-CM | POA: Diagnosis present

## 2024-02-21 DIAGNOSIS — E876 Hypokalemia: Secondary | ICD-10-CM | POA: Diagnosis present

## 2024-02-21 DIAGNOSIS — Z8249 Family history of ischemic heart disease and other diseases of the circulatory system: Secondary | ICD-10-CM

## 2024-02-21 DIAGNOSIS — C7951 Secondary malignant neoplasm of bone: Secondary | ICD-10-CM | POA: Diagnosis present

## 2024-02-21 DIAGNOSIS — Z91014 Allergy to mammalian meats: Secondary | ICD-10-CM

## 2024-02-21 DIAGNOSIS — Z91030 Bee allergy status: Secondary | ICD-10-CM

## 2024-02-21 DIAGNOSIS — C3411 Malignant neoplasm of upper lobe, right bronchus or lung: Secondary | ICD-10-CM

## 2024-02-21 DIAGNOSIS — Z515 Encounter for palliative care: Secondary | ICD-10-CM

## 2024-02-21 DIAGNOSIS — G9389 Other specified disorders of brain: Principal | ICD-10-CM

## 2024-02-21 DIAGNOSIS — E8770 Fluid overload, unspecified: Secondary | ICD-10-CM | POA: Diagnosis present

## 2024-02-21 DIAGNOSIS — C7931 Secondary malignant neoplasm of brain: Principal | ICD-10-CM | POA: Diagnosis present

## 2024-02-21 DIAGNOSIS — J439 Emphysema, unspecified: Secondary | ICD-10-CM | POA: Diagnosis present

## 2024-02-21 LAB — COMPREHENSIVE METABOLIC PANEL WITH GFR
ALT: 17 U/L (ref 0–44)
AST: 26 U/L (ref 15–41)
Albumin: 3.1 g/dL — ABNORMAL LOW (ref 3.5–5.0)
Alkaline Phosphatase: 136 U/L — ABNORMAL HIGH (ref 38–126)
Anion gap: 14 (ref 5–15)
BUN: <5 mg/dL — ABNORMAL LOW (ref 6–20)
CO2: 24 mmol/L (ref 22–32)
Calcium: 9.2 mg/dL (ref 8.9–10.3)
Chloride: 97 mmol/L — ABNORMAL LOW (ref 98–111)
Creatinine, Ser: 0.42 mg/dL — ABNORMAL LOW (ref 0.44–1.00)
GFR, Estimated: >60 mL/min (ref >60)
Glucose, Bld: 103 mg/dL — ABNORMAL HIGH (ref 70–99)
Potassium: 2.5 mmol/L — CL (ref 3.5–5.1)
Sodium: 135 mmol/L (ref 135–145)
Total Bilirubin: 0.5 mg/dL (ref 0.0–1.2)
Total Protein: 6.3 g/dL — ABNORMAL LOW (ref 6.5–8.1)

## 2024-02-21 LAB — I-STAT CHEM 8, ED
BUN: <3 mg/dL — ABNORMAL LOW (ref 6–20)
Calcium, Ion: 1.08 mmol/L — ABNORMAL LOW (ref 1.15–1.40)
Chloride: 96 mmol/L — ABNORMAL LOW (ref 98–111)
Creatinine, Ser: 0.5 mg/dL (ref 0.44–1.00)
Glucose, Bld: 97 mg/dL (ref 70–99)
HCT: 39 % (ref 36.0–46.0)
Hemoglobin: 13.3 g/dL (ref 12.0–15.0)
Potassium: 3.5 mmol/L (ref 3.5–5.1)
Sodium: 134 mmol/L — ABNORMAL LOW (ref 135–145)
TCO2: 27 mmol/L (ref 22–32)

## 2024-02-21 LAB — CBC WITH DIFFERENTIAL/PLATELET
Abs Immature Granulocytes: 0.26 10*3/uL — ABNORMAL HIGH (ref 0.00–0.07)
Basophils Absolute: 0.1 10*3/uL (ref 0.0–0.1)
Basophils Relative: 1 %
Eosinophils Absolute: 0.1 10*3/uL (ref 0.0–0.5)
Eosinophils Relative: 1 %
HCT: 36.9 % (ref 36.0–46.0)
Hemoglobin: 12.3 g/dL (ref 12.0–15.0)
Immature Granulocytes: 3 %
Lymphocytes Relative: 13 %
Lymphs Abs: 1.3 10*3/uL (ref 0.7–4.0)
MCH: 28.5 pg (ref 26.0–34.0)
MCHC: 33.3 g/dL (ref 30.0–36.0)
MCV: 85.4 fL (ref 80.0–100.0)
Monocytes Absolute: 1.2 10*3/uL — ABNORMAL HIGH (ref 0.1–1.0)
Monocytes Relative: 12 %
Neutro Abs: 6.9 10*3/uL (ref 1.7–7.7)
Neutrophils Relative %: 70 %
Platelets: 377 10*3/uL (ref 150–400)
RBC: 4.32 MIL/uL (ref 3.87–5.11)
RDW: 17.9 % — ABNORMAL HIGH (ref 11.5–15.5)
WBC: 9.8 10*3/uL (ref 4.0–10.5)
nRBC: 0 % (ref 0.0–0.2)

## 2024-02-21 LAB — MAGNESIUM: Magnesium: 1.5 mg/dL — ABNORMAL LOW (ref 1.7–2.4)

## 2024-02-21 LAB — AMMONIA: Ammonia: 29 umol/L (ref 9–35)

## 2024-02-21 LAB — CBG MONITORING, ED: Glucose-Capillary: 96 mg/dL (ref 70–99)

## 2024-02-21 LAB — PROTIME-INR
INR: 1 (ref 0.8–1.2)
Prothrombin Time: 13.2 s (ref 11.4–15.2)

## 2024-02-21 MED ORDER — OXYCODONE-ACETAMINOPHEN 5-325 MG PO TABS
1.0000 | ORAL_TABLET | Freq: Once | ORAL | Status: AC
  Administered 2024-02-21: 1 via ORAL
  Filled 2024-02-21: qty 1

## 2024-02-21 MED ORDER — SODIUM CHLORIDE 0.9 % IV SOLN: INTRAVENOUS | Status: AC

## 2024-02-21 MED ORDER — POTASSIUM CHLORIDE CRYS ER 20 MEQ PO TBCR
40.0000 meq | EXTENDED_RELEASE_TABLET | Freq: Once | ORAL | Status: AC
  Administered 2024-02-21: 40 meq via ORAL
  Filled 2024-02-21: qty 2

## 2024-02-21 MED ORDER — POTASSIUM CHLORIDE 10 MEQ/100ML IV SOLN
10.0000 meq | INTRAVENOUS | Status: AC
  Administered 2024-02-22 (×6): 10 meq via INTRAVENOUS
  Filled 2024-02-21 (×6): qty 100

## 2024-02-21 MED ORDER — MAGNESIUM SULFATE 2 GM/50ML IV SOLN
2.0000 g | Freq: Once | INTRAVENOUS | Status: AC
  Administered 2024-02-21: 2 g via INTRAVENOUS
  Filled 2024-02-21: qty 50

## 2024-02-21 NOTE — ED Notes (Signed)
 Pt earrings at bedside; taken off for CT scan

## 2024-02-21 NOTE — ED Provider Notes (Signed)
  EMERGENCY DEPARTMENT AT [facility] Provider Note   CSN: [id number] Arrival date & time: 02/21/24  1837     Patient presents with: Leg Swelling   Regina [name] is a 58 y.o. female.   HPI Patient presents for leg swelling.  Medical history includes COPD, anxiety, HTN, metastatic lung cancer, anxiety.  In January, she underwent craniotomy and SRS with Dr. Uvaldo Garfinkel in [place].  She had a pathologic fracture of right humerus in February, s/p nailing by Dr. Hermina Loosen.  She undergoes radiation to humeral head.  She underwent radiation to sacrum earlier this year.  She is currently on immunotherapy.  She arrives today via EMS from home.  She lives at home with family.  Family reported to EMS that she has had worsening BLE swelling, no urine output for the past 3 days, poor appetite.  Patient endorses concerns only of leg swelling which she states is chronic.  She states that she has continued to be able to walk.  Per family, her speech difficulty is baseline.  Per chart review, she was seen in the ED 5 days ago for leg swelling.  History per son over telephone: Due to her ongoing leg swelling, patient has had difficulty walking for the past week.  Prior to that, she was physically active.  He confirms that her speech difficulty is chronic.  He noticed a increase in the swelling since yesterday.  Previously, it was confined to her feet, it is now extending past her ankles.    Prior to Admission medications   Medication Sig Start Date End Date Taking? Authorizing Provider  acetaminophen  (TYLENOL ) 500 MG tablet Take 2 tablets (1,000 mg total) by mouth every 8 (eight) hours as needed for mild pain (pain score 1-3) or moderate pain (pain score 4-6). 02/01/24   Onetha Bile, MD  amLODipine  (NORVASC ) 5 MG tablet Take 1 tablet (5 mg total) by mouth daily. 02/13/24   Heilingoetter, Cassandra L, PA-C  bisacodyl  (DULCOLAX) 10 MG suppository Place 1 suppository (10 mg total) rectally as  needed for moderate constipation. 02/09/24   Sedalia Dacosta, MD  bisacodyl  (DULCOLAX) 5 MG EC tablet Take 1 tablet (5 mg total) by mouth daily as needed for moderate constipation. 02/09/24   Rizwan, Saima, MD  dexamethasone  (DECADRON ) 2 MG tablet Take 3 tablets (6 mg total) by mouth daily. 02/10/24   Rizwan, Saima, MD  diclofenac  Sodium (VOLTAREN ) 1 % GEL Apply 2 g topically 4 (four) times daily. Patient taking differently: Apply 2 g topically as needed. 12/10/23   Zelaya, Oscar A, PA-C  docusate sodium  (COLACE) 100 MG capsule Take 1 capsule (100 mg total) by mouth at bedtime. 02/09/24   Rizwan, Saima, MD  levETIRAcetam  (KEPPRA ) 500 MG tablet Take 1 tablet (500 mg total) by mouth 2 (two) times daily. 12/15/23   Ghimire, Estil Heman, MD  metoprolol  tartrate (LOPRESSOR ) 25 MG tablet Take 1 tablet (25 mg total) by mouth 2 (two) times daily. 12/15/23   Ghimire, Estil Heman, MD  oxyCODONE  (OXYCONTIN ) 15 mg 12 hr tablet Take 1 tablet (15 mg total) by mouth every 8 (eight) hours. 02/13/24   Pickenpack-Cousar, Athena N, NP  Oxycodone  HCl 10 MG TABS Take 1-1.5 tablets (10-15 mg total) by mouth every 4 (four) hours as needed for up to 7 days. 02/13/24 02/21/24  Pickenpack-Cousar, Athena N, NP  pantoprazole  (PROTONIX ) 40 MG tablet Take 1 tablet (40 mg total) by mouth daily. 12/15/23 12/14/24  Ghimire, Estil Heman, MD  polyethylene glycol (MIRALAX  /  GLYCOLAX ) 17 g packet Mix 1 packet (17 g) in 4-8 ounces of water and take by mouth daily. 12/27/23   Audria Leather, MD  potassium chloride  SA (KLOR-CON  M) 20 MEQ tablet Take 1 tablet (20 mEq total) by mouth 2 (two) times daily. 01/16/24   Marlene Simas, MD  senna (SENOKOT) 8.6 MG TABS tablet Take 1 tablet (8.6 mg total) by mouth 2 (two) times daily. 12/27/23   Audria Leather, MD  senna-docusate (SENOKOT-S) 8.6-50 MG tablet Take 2 tablets by mouth at bedtime. 02/09/24   Rizwan, Saima, MD  sodium phosphate  (FLEET) ENEM Place 133 mLs (1 enema total) rectally daily as needed for severe  constipation. 02/09/24   Rizwan, Saima, MD    Allergies: Pork allergy and Bee venom    Review of Systems  Constitutional:  Positive for activity change and appetite change.  Cardiovascular:  Positive for leg swelling.  Genitourinary:  Positive for decreased urine volume.  Neurological:  Positive for speech difficulty (Chronic).  All other systems reviewed and are negative.   Updated Vital Signs BP 138/73   Pulse (!) 110   Temp 99.7 F (37.6 C) (Oral)   Resp (!) 27   LMP 06/06/2010   SpO2 98%   Physical Exam Vitals and nursing note reviewed.  Constitutional:      General: She is not in acute distress.    Appearance: Normal appearance. She is well-developed. She is ill-appearing (Chronically). She is not toxic-appearing or diaphoretic.  HENT:     Head: Normocephalic and atraumatic.     Right Ear: External ear normal.     Left Ear: External ear normal.     Nose: Nose normal.     Mouth/Throat:     Mouth: Mucous membranes are moist.   Eyes:     Extraocular Movements: Extraocular movements intact.     Conjunctiva/sclera: Conjunctivae normal.    Cardiovascular:     Rate and Rhythm: Normal rate and regular rhythm.  Pulmonary:     Effort: Pulmonary effort is normal. No respiratory distress.  Chest:     Chest wall: No tenderness.  Abdominal:     General: There is no distension.     Palpations: Abdomen is soft.     Tenderness: There is no abdominal tenderness.   Musculoskeletal:        General: Tenderness present. No deformity.     Cervical back: Normal range of motion and neck supple.     Right lower leg: Edema present.     Left lower leg: Edema present.   Skin:    General: Skin is warm and dry.     Capillary Refill: Capillary refill takes less than 2 seconds.     Coloration: Skin is not jaundiced or pale.   Neurological:     Mental Status: She is alert.     Comments: Chronic speech difficulty, no other focal deficits.  Psychiatric:        Mood and Affect: Mood  normal.     (all labs ordered are listed, but only abnormal results are displayed) Labs Reviewed  COMPREHENSIVE METABOLIC PANEL WITH GFR - Abnormal; Notable for the following components:      Result Value   Potassium 2.5 (*)    Chloride 97 (*)    Glucose, Bld 103 (*)    BUN <5 (*)    Creatinine, Ser 0.42 (*)    Total Protein 6.3 (*)    Albumin 3.1 (*)    Alkaline Phosphatase 136 (*)  All other components within normal limits  CBC WITH DIFFERENTIAL/PLATELET - Abnormal; Notable for the following components:   RDW 17.9 (*)    Monocytes Absolute 1.2 (*)    Abs Immature Granulocytes 0.26 (*)    All other components within normal limits  MAGNESIUM  - Abnormal; Notable for the following components:   Magnesium  1.5 (*)    All other components within normal limits  I-STAT CHEM 8, ED - Abnormal; Notable for the following components:   Sodium 134 (*)    Chloride 96 (*)    BUN <3 (*)    Calcium , Ion 1.08 (*)    All other components within normal limits  AMMONIA  PROTIME-INR  URINALYSIS, ROUTINE W REFLEX MICROSCOPIC  BRAIN NATRIURETIC PEPTIDE  D-DIMER, QUANTITATIVE  CBG MONITORING, ED    EKG: None  Radiology: CT HEAD WO CONTRAST Result Date: 02/21/2024 CLINICAL DATA:  Mental status change, unknown cause. Edema of the lower leg and feet for the past few days. Hx of Brain Cancer. Family reports pt had brain surgery a month ago, since then pt baseline has been difficulty with communication and agitation due to the same difficulties EXAM: CT HEAD WITHOUT CONTRAST TECHNIQUE: Contiguous axial images were obtained from the base of the skull through the vertex without intravenous contrast. RADIATION DOSE REDUCTION: This exam was performed according to the departmental dose-optimization program which includes automated exposure control, adjustment of the mA and/or kV according to patient size and/or use of iterative reconstruction technique. COMPARISON:  MRI head 12/14/2023 FINDINGS: Brain:  Redemonstration of left frontotemporal encephalomalacia with known underlying mass better evaluated on MRI 12/14/2023 (2:18). Patchy and confluent areas of decreased attenuation are noted throughout the deep and periventricular white matter of the cerebral hemispheres bilaterally, compatible with chronic microvascular ischemic disease. No evidence of large-territorial acute infarction. No parenchymal hemorrhage. No new parenchymal mass lesion. No extra-axial collection. No mass effect or midline shift. No hydrocephalus. Basilar cisterns are patent. Vascular: No hyperdense vessel. Skull: Interval development of lytic calvarial fracture with question underlying soft tissue mass (2:25). Prior left frontal craniotomy. Sinuses/Orbits: Paranasal sinuses and mastoid air cells are clear. The orbits are unremarkable. Other: None. IMPRESSION: 1. Interval development of lytic calvarial fracture with question underlying soft tissue mass. Finding concerning for metastasis. Recommend MRI with and without contrast for further evaluation. 2. Redemonstration of left frontotemporal encephalomalacia with known underlying mass better evaluated on MRI 12/14/2023. Electronically Signed   By: Morgane  Naveau M.D.   On: 02/21/2024 19:50   DG Chest Port 1 View Result Date: 02/21/2024 CLINICAL DATA:  weakness EXAM: PORTABLE CHEST 1 VIEW COMPARISON:  Chest x-ray 02/06/2024 FINDINGS: The heart and mediastinal contours are within normal limits. Redemonstration of chronic coarsened interstitial markings at the lung bases with likely superimposed mild pulmonary edema. Biapical pleural/pulmonary scarring. No focal consolidation. No pleural effusion. No pneumothorax. No acute osseous abnormality. IMPRESSION: Mild pulmonary edema.  Underlying pulmonary fibrosis. Electronically Signed   By: Morgane  Naveau M.D.   On: 02/21/2024 19:25     Procedures   Medications Ordered in the ED  magnesium  sulfate IVPB 2 g 50 mL (has no administration in  time range)  oxyCODONE -acetaminophen  (PERCOCET/ROXICET) 5-325 MG per tablet 1 tablet (has no administration in time range)  potassium chloride  SA (KLOR-CON  M) CR tablet 40 mEq (has no administration in time range)  potassium chloride  10 mEq in 100 mL IVPB (has no administration in time range)  0.9 %  sodium chloride  infusion (has no administration in time range)  Medical Decision Making Amount and/or Complexity of Data Reviewed Labs: ordered. Radiology: ordered.  Risk Prescription drug management.   This patient presents to the ED for concern of leg swelling, this involves an extensive number of treatment options, and is a complaint that carries with it a high risk of complications and morbidity.  The differential diagnosis includes medication side effect, venous insufficiency, CHF, poor oncotic pressure, DVT, cellulitis   Co morbidities / Chronic conditions that complicate the patient evaluation  COPD, anxiety, HTN, metastatic lung cancer, anxiety   Additional history obtained:  Additional history obtained from EMR External records from outside source obtained and reviewed including EMS, patient's son   Lab Tests:  I Ordered, and personally interpreted labs.  The pertinent results include: Hypokalemia and hypomagnesemia are present.  Lab work is otherwise unremarkable.   Imaging Studies ordered:  I ordered imaging studies including chest x-ray, CT head, MRI brain I independently visualized and interpreted imaging which showed CT head showed a new lytic calvarial fracture with questionable underlying soft tissue mass. I agree with the radiologist interpretation   Cardiac Monitoring: / EKG:  The patient was maintained on a cardiac monitor.  I personally viewed and interpreted the cardiac monitored which showed an underlying rhythm of: Sinus rhythm.   Problem List / ED Course / Critical interventions / Medication management  Patient  presents via EMS from home for concern of worsening leg swelling.  She was seen in the ED 5 days ago for the same.  Per chart review, does not appear that any new treatments were initiated.  She is on amlodipine  at baseline.  On arrival in the ED, patient has speech difficulty consistent with expressive aphasia.  History from patient is limited.  She is able to answer basic questions and does state that her main concern is her leg swelling.  She endorses pain in distal bilateral legs, greater on the right.  Right leg is notable for increased swelling and tenderness.  Given limited history from patient, I did speak with her son over the telephone.  Patient's son states that his main concern is also the leg swelling.  He also states that patient is typically physically active but has not been able to walk for the past week.  He feels this is related to the painful swelling.  On exam, patient has limited function of her left leg.  This may be due to pain that is certainly worsened when she tries to move it.  It may be some weakness as well.  Lab work and imaging studies were ordered.  On CT scan of head, there does appear to be a new lytic lesion on the right frontal calvarium.  I spoke with neurosurgery, PA Maritza Sidles.  She reviewed imaging and discussed with Dr. Nat Badger.  They agree with MRI but do not feel that she needs transfer to [facility].  Plan will be for admission here at [facility] with MRI in the morning.  Patient's lab work was notable for hypokalemia and hypomagnesemia.  Replacement electrolytes were ordered.  Patient to be admitted for further management. I ordered medication including Percocet for analgesia; magnesium  sulfate for hypomagnesemia; potassium chloride  for hypokalemia Reevaluation of the patient after these medicines showed that the patient improved I have reviewed the patients home medicines and have made adjustments as needed   Consultations Obtained:  I requested consultation  with the neurosurgery team,  and discussed lab and imaging findings as well as pertinent plan - they recommend: Admission  here at [facility], MRI in the morning.   Social Determinants of Health:  Lives at home with son, expressive aphasia baseline     Final diagnoses:  Brain mass  Hypokalemia  Hypomagnesemia    ED Discharge Orders     None          Iva Mariner, MD 02/21/24 2317

## 2024-02-21 NOTE — ED Triage Notes (Addendum)
 Pt BIB EMS from home due to Edema of the lower leg and feet for the past few days. Hx of Brain Cancer. Family reports pt had brain surgery a month ago, since then pt baseline has been difficulty with communication and agitation due to the same difficulties. Family reports no urine output for the past 3 days. Pt follows commands and answer questions to the best of her ability. Pt report right sided weakness at baseline.  BP 164/86 HR 118 RR 20 CBG 130 20 ga IV LAC

## 2024-02-22 ENCOUNTER — Ambulatory Visit: Payer: MEDICAID

## 2024-02-22 ENCOUNTER — Other Ambulatory Visit: Payer: Self-pay | Admitting: Radiation Therapy

## 2024-02-22 ENCOUNTER — Inpatient Hospital Stay (HOSPITAL_COMMUNITY): Payer: MEDICAID

## 2024-02-22 ENCOUNTER — Ambulatory Visit
Admission: RE | Admit: 2024-02-22 | Discharge: 2024-02-22 | Disposition: A | Discharge status: Home / Self Care | Payer: MEDICAID | Source: Ambulatory Visit | Attending: Radiation Oncology

## 2024-02-22 ENCOUNTER — Other Ambulatory Visit: Payer: Self-pay

## 2024-02-22 ENCOUNTER — Telehealth: Payer: Self-pay | Admitting: Radiation Oncology

## 2024-02-22 DIAGNOSIS — M79662 Pain in left lower leg: Secondary | ICD-10-CM

## 2024-02-22 DIAGNOSIS — Z87891 Personal history of nicotine dependence: Secondary | ICD-10-CM | POA: Diagnosis not present

## 2024-02-22 DIAGNOSIS — Z681 Body mass index (BMI) 19 or less, adult: Secondary | ICD-10-CM | POA: Diagnosis not present

## 2024-02-22 DIAGNOSIS — R4701 Aphasia: Secondary | ICD-10-CM

## 2024-02-22 DIAGNOSIS — Z923 Personal history of irradiation: Secondary | ICD-10-CM | POA: Diagnosis not present

## 2024-02-22 DIAGNOSIS — E876 Hypokalemia: Secondary | ICD-10-CM

## 2024-02-22 DIAGNOSIS — I10 Essential (primary) hypertension: Secondary | ICD-10-CM | POA: Diagnosis present

## 2024-02-22 DIAGNOSIS — C7951 Secondary malignant neoplasm of bone: Secondary | ICD-10-CM | POA: Diagnosis present

## 2024-02-22 DIAGNOSIS — Z79899 Other long term (current) drug therapy: Secondary | ICD-10-CM | POA: Diagnosis not present

## 2024-02-22 DIAGNOSIS — G893 Neoplasm related pain (acute) (chronic): Secondary | ICD-10-CM | POA: Diagnosis present

## 2024-02-22 DIAGNOSIS — Z91014 Allergy to mammalian meats: Secondary | ICD-10-CM | POA: Diagnosis not present

## 2024-02-22 DIAGNOSIS — C3491 Malignant neoplasm of unspecified part of right bronchus or lung: Secondary | ICD-10-CM | POA: Diagnosis present

## 2024-02-22 DIAGNOSIS — Z8249 Family history of ischemic heart disease and other diseases of the circulatory system: Secondary | ICD-10-CM | POA: Diagnosis not present

## 2024-02-22 DIAGNOSIS — M7989 Other specified soft tissue disorders: Secondary | ICD-10-CM | POA: Diagnosis not present

## 2024-02-22 DIAGNOSIS — C7931 Secondary malignant neoplasm of brain: Secondary | ICD-10-CM | POA: Diagnosis present

## 2024-02-22 DIAGNOSIS — J811 Chronic pulmonary edema: Secondary | ICD-10-CM | POA: Diagnosis present

## 2024-02-22 DIAGNOSIS — E8770 Fluid overload, unspecified: Secondary | ICD-10-CM | POA: Diagnosis present

## 2024-02-22 DIAGNOSIS — G9389 Other specified disorders of brain: Secondary | ICD-10-CM

## 2024-02-22 DIAGNOSIS — Z91030 Bee allergy status: Secondary | ICD-10-CM | POA: Diagnosis not present

## 2024-02-22 DIAGNOSIS — Z806 Family history of leukemia: Secondary | ICD-10-CM | POA: Diagnosis not present

## 2024-02-22 DIAGNOSIS — J439 Emphysema, unspecified: Secondary | ICD-10-CM | POA: Diagnosis present

## 2024-02-22 LAB — URINALYSIS, ROUTINE W REFLEX MICROSCOPIC
Bilirubin Urine: NEGATIVE
Glucose, UA: NEGATIVE mg/dL
Ketones, ur: NEGATIVE mg/dL
Nitrite: POSITIVE — AB
Protein, ur: NEGATIVE mg/dL
Specific Gravity, Urine: 1.006 (ref 1.005–1.030)
pH: 7 (ref 5.0–8.0)

## 2024-02-22 LAB — BASIC METABOLIC PANEL WITH GFR
Anion gap: 12 (ref 5–15)
BUN: <5 mg/dL — ABNORMAL LOW (ref 6–20)
CO2: 23 mmol/L (ref 22–32)
Calcium: 8.6 mg/dL — ABNORMAL LOW (ref 8.9–10.3)
Chloride: 99 mmol/L (ref 98–111)
Creatinine, Ser: 0.4 mg/dL — ABNORMAL LOW (ref 0.44–1.00)
GFR, Estimated: >60 mL/min (ref >60)
Glucose, Bld: 93 mg/dL (ref 70–99)
Potassium: 3.3 mmol/L — ABNORMAL LOW (ref 3.5–5.1)
Sodium: 134 mmol/L — ABNORMAL LOW (ref 135–145)

## 2024-02-22 LAB — RAD ONC ARIA SESSION SUMMARY
Course Elapsed Days: 14
Plan Fractions Treated to Date: 6
Plan Prescribed Dose Per Fraction: 3 Gy
Plan Total Fractions Prescribed: 10
Plan Total Prescribed Dose: 30 Gy
Reference Point Dosage Given to Date: 18 Gy
Reference Point Session Dosage Given: 3 Gy
Session Number: 6

## 2024-02-22 LAB — CBC
HCT: 36.5 % (ref 36.0–46.0)
Hemoglobin: 11.9 g/dL — ABNORMAL LOW (ref 12.0–15.0)
MCH: 28.5 pg (ref 26.0–34.0)
MCHC: 32.6 g/dL (ref 30.0–36.0)
MCV: 87.5 fL (ref 80.0–100.0)
Platelets: 373 10*3/uL (ref 150–400)
RBC: 4.17 MIL/uL (ref 3.87–5.11)
RDW: 18 % — ABNORMAL HIGH (ref 11.5–15.5)
WBC: 9.9 10*3/uL (ref 4.0–10.5)
nRBC: 0 % (ref 0.0–0.2)

## 2024-02-22 LAB — D-DIMER, QUANTITATIVE: D-Dimer, Quant: 3.9 ug{FEU}/mL — ABNORMAL HIGH (ref 0.00–0.50)

## 2024-02-22 LAB — BRAIN NATRIURETIC PEPTIDE: B Natriuretic Peptide: 55.8 pg/mL (ref 0.0–100.0)

## 2024-02-22 MED ORDER — SODIUM CHLORIDE (PF) 0.9 % IJ SOLN
INTRAMUSCULAR | Status: AC
  Filled 2024-02-22: qty 50

## 2024-02-22 MED ORDER — DEXAMETHASONE 4 MG PO TABS
6.0000 mg | ORAL_TABLET | Freq: Every day | ORAL | Status: DC
  Administered 2024-02-22 – 2024-02-23 (×2): 6 mg via ORAL
  Filled 2024-02-22 (×3): qty 2

## 2024-02-22 MED ORDER — FENTANYL CITRATE PF 50 MCG/ML IJ SOSY
25.0000 ug | PREFILLED_SYRINGE | INTRAMUSCULAR | Status: DC | PRN
  Administered 2024-02-22: 25 ug via INTRAVENOUS
  Filled 2024-02-22: qty 1

## 2024-02-22 MED ORDER — ONDANSETRON HCL 4 MG PO TABS: 4.0000 mg | ORAL_TABLET | Freq: Four times a day (QID) | ORAL | Status: DC | PRN

## 2024-02-22 MED ORDER — ACETAMINOPHEN 325 MG PO TABS: 650.0000 mg | ORAL_TABLET | Freq: Four times a day (QID) | ORAL | Status: DC | PRN

## 2024-02-22 MED ORDER — ENOXAPARIN SODIUM 40 MG/0.4ML IJ SOSY
40.0000 mg | PREFILLED_SYRINGE | INTRAMUSCULAR | Status: DC
  Administered 2024-02-22 – 2024-02-23 (×2): 40 mg via SUBCUTANEOUS
  Filled 2024-02-22 (×2): qty 0.4

## 2024-02-22 MED ORDER — METOPROLOL TARTRATE 25 MG PO TABS
25.0000 mg | ORAL_TABLET | Freq: Two times a day (BID) | ORAL | Status: DC
  Administered 2024-02-22 – 2024-02-23 (×3): 25 mg via ORAL
  Filled 2024-02-22 (×3): qty 1

## 2024-02-22 MED ORDER — POTASSIUM CHLORIDE CRYS ER 20 MEQ PO TBCR: 40.0000 meq | EXTENDED_RELEASE_TABLET | Freq: Once | ORAL | Status: DC

## 2024-02-22 MED ORDER — MAGNESIUM SULFATE 2 GM/50ML IV SOLN
2.0000 g | Freq: Once | INTRAVENOUS | Status: AC
  Administered 2024-02-22: 2 g via INTRAVENOUS
  Filled 2024-02-22: qty 50

## 2024-02-22 MED ORDER — ACETAMINOPHEN 650 MG RE SUPP: 650.0000 mg | Freq: Four times a day (QID) | RECTAL | Status: DC | PRN

## 2024-02-22 MED ORDER — ONDANSETRON HCL 4 MG/2ML IJ SOLN
4.0000 mg | Freq: Four times a day (QID) | INTRAMUSCULAR | Status: DC | PRN
  Filled 2024-02-22: qty 2

## 2024-02-22 MED ORDER — OXYCODONE HCL ER 15 MG PO T12A
15.0000 mg | EXTENDED_RELEASE_TABLET | Freq: Three times a day (TID) | ORAL | Status: DC
  Administered 2024-02-22 – 2024-02-23 (×4): 15 mg via ORAL
  Filled 2024-02-22 (×4): qty 1

## 2024-02-22 MED ORDER — OXYCODONE HCL 5 MG PO TABS
10.0000 mg | ORAL_TABLET | ORAL | Status: DC | PRN
  Administered 2024-02-22 – 2024-02-23 (×4): 15 mg via ORAL
  Filled 2024-02-22 (×4): qty 3

## 2024-02-22 MED ORDER — PANTOPRAZOLE SODIUM 40 MG PO TBEC
40.0000 mg | DELAYED_RELEASE_TABLET | Freq: Every day | ORAL | Status: DC
  Administered 2024-02-22 – 2024-02-23 (×2): 40 mg via ORAL
  Filled 2024-02-22 (×2): qty 1

## 2024-02-22 MED ORDER — IOHEXOL 350 MG/ML SOLN
75.0000 mL | Freq: Once | INTRAVENOUS | Status: AC | PRN
  Administered 2024-02-22: 75 mL via INTRAVENOUS

## 2024-02-22 MED ORDER — GADOBUTROL 1 MMOL/ML IV SOLN
5.0000 mL | Freq: Once | INTRAVENOUS | Status: AC | PRN
  Administered 2024-02-22: 5 mL via INTRAVENOUS

## 2024-02-22 MED ORDER — AMLODIPINE BESYLATE 5 MG PO TABS
5.0000 mg | ORAL_TABLET | Freq: Every day | ORAL | Status: DC
  Administered 2024-02-22 – 2024-02-23 (×2): 5 mg via ORAL
  Filled 2024-02-22 (×2): qty 1

## 2024-02-22 MED ORDER — LEVETIRACETAM 500 MG PO TABS
500.0000 mg | ORAL_TABLET | Freq: Two times a day (BID) | ORAL | Status: DC
  Administered 2024-02-22 – 2024-02-23 (×3): 500 mg via ORAL
  Filled 2024-02-22 (×3): qty 1

## 2024-02-22 NOTE — Telephone Encounter (Signed)
 6/18 Received message from the call center patient is currently in hospital due to leg swelling and edema.  Email sent to [employer]4 machine/Support RTT and copied Ashlyn B, and [place] S., so they are aware.

## 2024-02-22 NOTE — Plan of Care (Signed)

## 2024-02-22 NOTE — ED Notes (Signed)
 ED TO INPATIENT HANDOFF REPORT  Name/Age/Gender Regina [name] 58 y.o. female  Code Status    Code Status Orders  (From admission, onward)           Start     Ordered   02/22/24 0327  Full code  Continuous       Question:  By:  Answer:  Consent: discussion documented in EHR   02/22/24 0328           Code Status History     Date Active Date Inactive Code Status Order ID Comments User Context   02/06/2024 1932 02/09/2024 1823 Full Code [id number]  Willadean Hark, MD ED   12/19/2023 1045 12/27/2023 1548 Full Code [id number]  Gaylin Ke, MD ED   12/13/2023 0737 12/15/2023 1719 Full Code [id number]  Lena Qualia, MD ED   12/13/2023 0019 12/13/2023 0737 Full Code [id number]  Howerter, Gattis Kass, DO ED   11/22/2023 1953 11/25/2023 1920 Full Code [id number]  Selene Dais, MD ED   10/01/2023 2235 10/14/2023 0046 Full Code [id number]  Corrinne Din, MD ED   09/17/2023 1938 09/20/2023 2053 Full Code [id number]  Walton Guppy, MD ED   01/30/2020 1157 01/30/2020 2002 Full Code [id number]  Tama Fails, PA-C ED   04/16/2018 0009 04/16/2018 1750 Full Code [id number]  Fidencio Hue, MD ED      Advance Directive Documentation    Flowsheet Row Most Recent Value  Type of Advance Directive Healthcare Power of Attorney, Living will  Pre-existing out of facility DNR order (yellow form or pink MOST form) --  MOST Form in Place? --    Home/SNF/Other Home  Chief Complaint Aphasia [R47.01]  Level of Care/Admitting Diagnosis ED Disposition     ED Disposition  Admit   Condition  --   Comment  Hospital [facility] [[id number]]  Level of Care: Telemetry [5]  Admit to tele based on following criteria: Eval of Syncope  May admit patient to [facility] or [facility] if equivalent level of care is available:: No  Covid Evaluation: Asymptomatic - no recent exposure (last 10 days) testing not required  Diagnosis: Aphasia [784.3.ICD-9-CM]  Admitting Physician: Myrl Askew [[id number]]  Attending Physician: Myrl Askew [[id number]]  Certification:: I certify this patient will need inpatient services for at least 2 midnights  Expected Medical Readiness: 02/24/2024          Medical History Past Medical History:  Diagnosis Date   Allergy    Anxiety    Brain mass    Cancer [facility])    COPD (chronic obstructive pulmonary disease) (HCC)    Emphysema of lung (HCC)    Hypertension     Allergies Allergies  Allergen Reactions   Pork Allergy Nausea And Vomiting   Bee Venom Hives    IV Location/Drains/Wounds Patient Lines/Drains/Airways Status     Active Line/Drains/Airways     Name Placement date Placement time Site Days   Peripheral IV 02/21/24 22 G Left;Posterior Hand 02/21/24  2005  Hand  1            Labs/Imaging Results for orders placed or performed during the hospital encounter of 02/21/24 (from the past 48 hours)  Urinalysis, Routine w reflex microscopic -Urine, Clean Catch     Status: Abnormal   Collection Time: 02/21/24  3:05 AM  Result Value Ref Range   Color, Urine YELLOW YELLOW   APPearance HAZY (A) CLEAR   Specific Gravity, Urine 1.006 1.005 - 1.030   pH  7.0 5.0 - 8.0   Glucose, UA NEGATIVE NEGATIVE mg/dL   Hgb urine dipstick SMALL (A) NEGATIVE   Bilirubin Urine NEGATIVE NEGATIVE   Ketones, ur NEGATIVE NEGATIVE mg/dL   Protein, ur NEGATIVE NEGATIVE mg/dL   Nitrite POSITIVE (A) NEGATIVE   Leukocytes,Ua MODERATE (A) NEGATIVE   RBC / HPF 0-5 0 - 5 RBC/hpf   WBC, UA 11-20 0 - 5 WBC/hpf   Bacteria, UA MANY (A) NONE SEEN   Squamous Epithelial / HPF 6-10 0 - 5 /HPF   Mucus PRESENT     Comment: Performed at [facility], [address], [place], [place] [address]  CBG monitoring, ED     Status: None   Collection Time: 02/21/24  7:53 PM  Result Value Ref Range   Glucose-Capillary 96 70 - 99 mg/dL    Comment: Glucose reference range applies only to samples taken after fasting for at least 8 hours.   Comprehensive metabolic panel     Status: Abnormal   Collection Time: 02/21/24  8:06 PM  Result Value Ref Range   Sodium 135 135 - 145 mmol/L   Potassium 2.5 (LL) 3.5 - 5.1 mmol/L    Comment: CRITICAL RESULT CALLED TO, READ BACK BY AND VERIFIED WITH: Ansel Bass, RN 02/21/24 2056 J. COLE REPEATED TO VERIFY    Chloride 97 (L) 98 - 111 mmol/L   CO2 24 22 - 32 mmol/L   Glucose, Bld 103 (H) 70 - 99 mg/dL    Comment: Glucose reference range applies only to samples taken after fasting for at least 8 hours.   BUN <5 (L) 6 - 20 mg/dL   Creatinine, Ser 0.16 (L) 0.44 - 1.00 mg/dL   Calcium  9.2 8.9 - 10.3 mg/dL   Total Protein 6.3 (L) 6.5 - 8.1 g/dL   Albumin 3.1 (L) 3.5 - 5.0 g/dL   AST 26 15 - 41 U/L   ALT 17 0 - 44 U/L   Alkaline Phosphatase 136 (H) 38 - 126 U/L   Total Bilirubin 0.5 0.0 - 1.2 mg/dL   GFR, Estimated >01 >09 mL/min    Comment: (NOTE) Calculated using the CKD-EPI Creatinine Equation (2021)    Anion gap 14 5 - 15    Comment: Performed at [facility], [address] [place], [place] [address]  CBC WITH DIFFERENTIAL     Status: Abnormal   Collection Time: 02/21/24  8:06 PM  Result Value Ref Range   WBC 9.8 4.0 - 10.5 K/uL   RBC 4.32 3.87 - 5.11 MIL/uL   Hemoglobin 12.3 12.0 - 15.0 g/dL   HCT 73.2 20.2 - 54.2 %   MCV 85.4 80.0 - 100.0 fL   MCH 28.5 26.0 - 34.0 pg   MCHC 33.3 30.0 - 36.0 g/dL   RDW 70.6 (H) 23.7 - 62.8 %   Platelets 377 150 - 400 K/uL   nRBC 0.0 0.0 - 0.2 %   Neutrophils Relative % 70 %   Neutro Abs 6.9 1.7 - 7.7 K/uL   Lymphocytes Relative 13 %   Lymphs Abs 1.3 0.7 - 4.0 K/uL   Monocytes Relative 12 %   Monocytes Absolute 1.2 (H) 0.1 - 1.0 K/uL   Eosinophils Relative 1 %   Eosinophils Absolute 0.1 0.0 - 0.5 K/uL   Basophils Relative 1 %   Basophils Absolute 0.1 0.0 - 0.1 K/uL   Immature Granulocytes 3 %   Abs Immature Granulocytes 0.26 (H) 0.00 - 0.07 K/uL  Comment: Performed at [facility], [address] [place], [place] [address]  Ammonia     Status: None   Collection Time: 02/21/24  8:06 PM  Result Value Ref Range   Ammonia 29 9 - 35 umol/L    Comment: Performed at [facility], [address] [place], [place] [address]  Magnesium      Status: Abnormal   Collection Time: 02/21/24  8:06 PM  Result Value Ref Range   Magnesium  1.5 (L) 1.7 - 2.4 mg/dL    Comment: Performed at [facility], [address], [place], [place] [address]  Protime-INR     Status: None   Collection Time: 02/21/24  8:06 PM  Result Value Ref Range   Prothrombin Time 13.2 11.4 - 15.2 seconds   INR 1.0 0.8 - 1.2    Comment: (NOTE) INR goal varies based on device and disease states. Performed at [facility], [address] [place], [place] [address]   Brain natriuretic peptide     Status: None   Collection Time: 02/21/24  8:06 PM  Result Value Ref Range   B Natriuretic Peptide 55.8 0.0 - 100.0 pg/mL    Comment: Performed at [facility], [address], [place], [place] [address]  I-stat chem 8, ED (not at [facility], [facility] or [facility])     Status: Abnormal   Collection Time: 02/21/24  8:24 PM  Result Value Ref Range   Sodium 134 (L) 135 - 145 mmol/L   Potassium 3.5 3.5 - 5.1 mmol/L   Chloride 96 (L) 98 - 111 mmol/L   BUN <3 (L) 6 - 20 mg/dL   Creatinine, Ser 1.30 0.44 - 1.00 mg/dL   Glucose, Bld 97 70 - 99 mg/dL    Comment: Glucose reference range applies only to samples taken after fasting for at least 8 hours.   Calcium , Ion 1.08 (L) 1.15 - 1.40 mmol/L   TCO2 27 22 - 32 mmol/L   Hemoglobin 13.3 12.0 - 15.0 g/dL   HCT 86.5 78.4 - 69.6 %  D-dimer, quantitative     Status: Abnormal   Collection Time: 02/21/24 11:56 PM  Result Value Ref Range   D-Dimer, Quant 3.90 (H) 0.00 - 0.50 ug/mL-FEU    Comment: (NOTE) At the manufacturer cut-off value of 0.5 g/mL FEU, this assay has a negative predictive value of 95-100%.This assay is  intended for use in conjunction with a clinical pretest probability (PTP) assessment model to exclude pulmonary embolism (PE) and deep venous thrombosis (DVT) in outpatients suspected of PE or DVT. Results should be correlated with clinical presentation. Performed at [facility], [address], [place], [place] [address]    CT HEAD WO CONTRAST Result Date: 02/21/2024 CLINICAL DATA:  Mental status change, unknown cause. Edema of the lower leg and feet for the past few days. Hx of Brain Cancer. Family reports pt had brain surgery a month ago, since then pt baseline has been difficulty with communication and agitation due to the same difficulties EXAM: CT HEAD WITHOUT CONTRAST TECHNIQUE: Contiguous axial images were obtained from the base of the skull through the vertex without intravenous contrast. RADIATION DOSE REDUCTION: This exam was performed according to the departmental dose-optimization program which includes automated exposure control, adjustment of the mA and/or kV according to patient size and/or use of iterative reconstruction technique. COMPARISON:  MRI head 12/14/2023 FINDINGS: Brain: Redemonstration of left frontotemporal encephalomalacia with known underlying mass better evaluated  on MRI 12/14/2023 (2:18). Patchy and confluent areas of decreased attenuation are noted throughout the deep and periventricular white matter of the cerebral hemispheres bilaterally, compatible with chronic microvascular ischemic disease. No evidence of large-territorial acute infarction. No parenchymal hemorrhage. No new parenchymal mass lesion. No extra-axial collection. No mass effect or midline shift. No hydrocephalus. Basilar cisterns are patent. Vascular: No hyperdense vessel. Skull: Interval development of lytic calvarial fracture with question underlying soft tissue mass (2:25). Prior left frontal craniotomy. Sinuses/Orbits: Paranasal sinuses and mastoid air cells are clear. The orbits  are unremarkable. Other: None. IMPRESSION: 1. Interval development of lytic calvarial fracture with question underlying soft tissue mass. Finding concerning for metastasis. Recommend MRI with and without contrast for further evaluation. 2. Redemonstration of left frontotemporal encephalomalacia with known underlying mass better evaluated on MRI 12/14/2023. Electronically Signed   By: Morgane  Naveau M.D.   On: 02/21/2024 19:50   DG Chest Port 1 View Result Date: 02/21/2024 CLINICAL DATA:  weakness EXAM: PORTABLE CHEST 1 VIEW COMPARISON:  Chest x-ray 02/06/2024 FINDINGS: The heart and mediastinal contours are within normal limits. Redemonstration of chronic coarsened interstitial markings at the lung bases with likely superimposed mild pulmonary edema. Biapical pleural/pulmonary scarring. No focal consolidation. No pleural effusion. No pneumothorax. No acute osseous abnormality. IMPRESSION: Mild pulmonary edema.  Underlying pulmonary fibrosis. Electronically Signed   By: Morgane  Naveau M.D.   On: 02/21/2024 19:25    Pending Labs Unresulted Labs (From admission, onward)     Start     Ordered   02/22/24 0500  CBC  Tomorrow morning,   R        02/22/24 0328   02/22/24 0500  Basic metabolic panel  Tomorrow morning,   R        02/22/24 0328            Vitals/Pain Today's Vitals   02/22/24 0200 02/22/24 0223 02/22/24 0336 02/22/24 0337  BP: 137/86  (!) 143/82   Pulse: 85  79   Resp: 19  18   Temp:   98.2 F (36.8 C)   TempSrc:   Oral   SpO2: 97%  98%   PainSc:  Asleep  8     Isolation Precautions No active isolations  Medications Medications  potassium chloride  10 mEq in 100 mL IVPB (10 mEq Intravenous New Bag/Given 02/22/24 0303)  0.9 %  sodium chloride  infusion ( Intravenous New Bag/Given 02/21/24 2353)  fentaNYL  (SUBLIMAZE ) injection 25 mcg (25 mcg Intravenous Given 02/22/24 0337)  oxyCODONE  (OXYCONTIN ) 12 hr tablet 15 mg (has no administration in time range)  oxyCODONE  (Oxy  IR/ROXICODONE ) immediate release tablet 10-15 mg (has no administration in time range)  amLODipine  (NORVASC ) tablet 5 mg (has no administration in time range)  metoprolol  tartrate (LOPRESSOR ) tablet 25 mg (has no administration in time range)  dexamethasone  (DECADRON ) tablet 6 mg (has no administration in time range)  pantoprazole  (PROTONIX ) EC tablet 40 mg (has no administration in time range)  levETIRAcetam  (KEPPRA ) tablet 500 mg (has no administration in time range)  enoxaparin  (LOVENOX ) injection 40 mg (has no administration in time range)  acetaminophen  (TYLENOL ) tablet 650 mg (has no administration in time range)    Or  acetaminophen  (TYLENOL ) suppository 650 mg (has no administration in time range)  ondansetron  (ZOFRAN ) tablet 4 mg (has no administration in time range)    Or  ondansetron  (ZOFRAN ) injection 4 mg (has no administration in time range)  magnesium  sulfate IVPB 2 g 50 mL (has no administration in  time range)  magnesium  sulfate IVPB 2 g 50 mL (0 g Intravenous Stopped 02/22/24 0043)  oxyCODONE -acetaminophen  (PERCOCET/ROXICET) 5-325 MG per tablet 1 tablet (1 tablet Oral Given 02/21/24 2342)  potassium chloride  SA (KLOR-CON  M) CR tablet 40 mEq (40 mEq Oral Given 02/21/24 2342)

## 2024-02-22 NOTE — ED Notes (Signed)
 CT scanner is currently down for calibration so delay in CTA at this time.

## 2024-02-22 NOTE — ED Notes (Signed)
 Iv team abs

## 2024-02-22 NOTE — Progress Notes (Addendum)
 [facility]                                                                              Regina [name], is a 58 y.o. female, DOB - [birth year], WUJ:[id number] Admit date - 02/21/2024    Outpatient Primary MD for the patient is Denece Finger, Meade Spencer, NP  LOS - 0  days  Chief Complaint  Patient presents with   Leg Swelling       Brief summary   Patient is a 58 year old female with COPD, HTN, metastatic lung CA status post craniotomy, pathologic fracture, XRT, currently on immunotherapy presented to ED with worsening lower extremity swelling and poor appetite.  Also reported changes in her speech.  She has had longstanding issues with speech difficulties however felt her aphasia had worsened. On arrival, noted to be tachycardiac, potassium 2.5, creatinine 0.4, magnesium  1.5, D-dimer 3.9. Chest x-ray showed pulmonary edema, CT head showed interval development of lytic fracture and soft tissue mass concerning for metastasis with recommended MRI  Assessment & Plan    Principal Problem:   Aphasia, brain metastasis - CT head showed interval development of lytic calvarial fracture with question underlying soft tissue mass, finding concerning for metastasis, recommended MRI - MRI of the brain showed treated left hemisphere brain metastasis is larger since 12/14/2023 now up to 33 mm long axis, indeterminate for pseudo progression versus true tumor progression, no new parenchymal brain metastasis.  Progressed right lateral convexity skull metastasis, up to 2.8 cm long axis -EDP discussed with neurosurgery on-call, who did not feel there was any acute neurosurgical issue, recommended MRI. - She has chronic speech difficulty but was able to converse with me. Unclear if she needs steroids or radiation at this point.  Requested oncology, Dr. Marguerita Shih, neuro-oncology and radiation oncology input. - Continue dexamethasone  6 mg daily, Keppra  500 mg twice daily for seizure prophylaxis, continue  PPI Addendum:  Per Dr. Mark Sil, if the aphasia has improved then continue dexamethasone , will review in tumor board for further management and follow-up outpatient in office  Hypokalemia -Replaced   COPD - Currently no acute wheezing,    Metastatic lung CA Lower extremity swelling - CT angiogram chest negative for acute PE, interval increase in size of right apical nodule, mildly progressive hilar and axillary lymphadenopathy, emphysema - Obtain venous US  lower extremity to rule out DVT    Estimated body mass index is 19.74 kg/m as calculated from the following:   Height as of 02/13/24: 5' 6 (1.676 m).   Weight as of 02/13/24: 55.5 kg.  Code Status: full code  DVT Prophylaxis:  enoxaparin  (LOVENOX ) injection 40 mg Start: 02/22/24 1000 SCDs Start: 02/22/24 0327   Level of Care: Level of care: Telemetry Family Communication: Updated patient Disposition Plan:      Remains inpatient appropriate:      Procedures:    Consultants:   Oncology, Dr. Marguerita Shih Neuro-oncology Radiation oncology  Antimicrobials:   Anti-infectives (From admission, onward)    None          Medications  amLODipine   5 mg Oral Daily   dexamethasone   6 mg Oral Daily   enoxaparin  (LOVENOX )  injection  40 mg Subcutaneous Q24H   levETIRAcetam   500 mg Oral BID   metoprolol  tartrate  25 mg Oral BID   oxyCODONE   15 mg Oral Q8H   pantoprazole   40 mg Oral Daily      Subjective:   Regina [name] was seen and examined today.  Slow to speak, otherwise following commands.  No acute dysarthria noted.  Patient denies dizziness, chest pain, nausea vomiting or abdominal pain.    Objective:   Vitals:   02/22/24 0336 02/22/24 0513 02/22/24 1016 02/22/24 1411  BP: (!) 143/82 (!) 154/80 (!) 143/82 (!) 141/78  Pulse: 79 85 84 84  Resp: 18 18 20 18   Temp: 98.2 F (36.8 C) 98 F (36.7 C) 97.9 F (36.6 C) 98.8 F (37.1 C)  TempSrc: Oral Oral Oral Oral  SpO2: 98% 94% 93% 93%    Intake/Output Summary  (Last 24 hours) at 02/22/2024 1456 Last data filed at 02/22/2024 1014 Gross per 24 hour  Intake 1340.69 ml  Output 400 ml  Net 940.69 ml     Wt Readings from Last 3 Encounters:  02/13/24 55.5 kg  02/06/24 54 kg  02/01/24 54 kg     Exam General: Alert and oriented x 3, NAD, chronic speech impairment, ill-appearing Cardiovascular: S1 S2 auscultated,  RRR Respiratory: Diminished breath sounds bases Gastrointestinal: Soft, nontender, nondistended, + bowel sounds Ext: + pedal edema bilaterally Neuro: Strength 5/5 upper and lower extremities bilaterally, slow speech otherwise no acute dysarthria Psych: Normal affect     Data Reviewed:  I have personally reviewed following labs    CBC Lab Results  Component Value Date   WBC 9.9 02/22/2024   RBC 4.17 02/22/2024   HGB 11.9 (L) 02/22/2024   HCT 36.5 02/22/2024   MCV 87.5 02/22/2024   MCH 28.5 02/22/2024   PLT 373 02/22/2024   MCHC 32.6 02/22/2024   RDW 18.0 (H) 02/22/2024   LYMPHSABS 1.3 02/21/2024   MONOABS 1.2 (H) 02/21/2024   EOSABS 0.1 02/21/2024   BASOSABS 0.1 02/21/2024     Last metabolic panel Lab Results  Component Value Date   NA 134 (L) 02/22/2024   K 3.3 (L) 02/22/2024   CL 99 02/22/2024   CO2 23 02/22/2024   BUN <5 (L) 02/22/2024   CREATININE 0.40 (L) 02/22/2024   GLUCOSE 93 02/22/2024   GFRNONAA >60 02/22/2024   GFRAA >60 01/30/2020   CALCIUM  8.6 (L) 02/22/2024   PHOS 3.4 12/21/2023   PROT 6.3 (L) 02/21/2024   ALBUMIN 3.1 (L) 02/21/2024   LABGLOB 3.0 10/19/2017   AGRATIO 1.5 10/19/2017   BILITOT 0.5 02/21/2024   ALKPHOS 136 (H) 02/21/2024   AST 26 02/21/2024   ALT 17 02/21/2024   ANIONGAP 12 02/22/2024    CBG (last 3)  Recent Labs    02/21/24 1953  GLUCAP 96      Coagulation Profile: Recent Labs  Lab 02/21/24 2006  INR 1.0     Radiology Studies: I have personally reviewed the imaging studies  CT Angio Chest Pulmonary Embolism (PE) W or WO Contrast Result Date:  02/22/2024 CLINICAL DATA:  Shortness of breath. Leg swelling. Metastatic lung cancer. Clinical concern for pulmonary embolus. EXAM: CT ANGIOGRAPHY CHEST WITH CONTRAST TECHNIQUE: Multidetector CT imaging of the chest was performed using the standard protocol during bolus administration of intravenous contrast. Multiplanar CT image reconstructions and MIPs were obtained to evaluate the vascular anatomy. RADIATION DOSE REDUCTION: This exam was performed according to the departmental dose-optimization program which includes  automated exposure control, adjustment of the mA and/or kV according to patient size and/or use of iterative reconstruction technique. CONTRAST:  75mL OMNIPAQUE  IOHEXOL  350 MG/ML SOLN COMPARISON:  01/28/2024 FINDINGS: Cardiovascular: The heart is enlarged. No substantial pericardial effusion. Mild atherosclerotic calcification is noted in the wall of the thoracic aorta. There is no filling defect within the opacified pulmonary arteries to suggest the presence of an acute pulmonary embolus. Mediastinum/Nodes: Upper normal mediastinal lymph nodes are similar to prior. 12 mm short axis right hilar node on 37/4 was 8 mm previously. 13 mm short axis left hilar node on 40/4 was 10 mm previously. 19 mm short axis right inferior hilar node on 50/4 was 12 mm short axis previously. The esophagus has normal imaging features. Right axillary and subpectoral lymphadenopathy again noted. Index right-sided node measured previously at 13 mm is 13 mm again today on image 21/4. Lungs/Pleura: Advanced changes of centrilobular and paraseptal emphysema noted with diffuse bilateral subpleural reticulation basilar predominant subpleural honeycombing, features consistent with fibrosis. No substantial pleural effusion. Right apical nodule measured previously at 2.3 x 1.6 cm is now 2.5 x 2.4 cm on image 28/6. no new focal dense consolidative opacity. Basilar interstitial markings and ground-glass opacity in the lower lobes is  progressive in the interval, likely technical as appearance is similar to that of a study from 12/12/2023. Upper Abdomen: Visualized portion of the upper abdomen shows no acute findings. Musculoskeletal: No worrisome lytic or sclerotic osseous abnormality. Status post ORIF right humerus. Review of the MIP images confirms the above findings. IMPRESSION: 1. No CT evidence for acute pulmonary embolus. 2. Interval increase in size of the right apical nodule. 3. Hilar and axillary lymphadenopathy is mildly progressive in the interval. 4. Stable right axillary and subpectoral lymphadenopathy. 5. Emphysema superimposed on chronic interstitial lung disease/pulmonary fibrosis. 6. Aortic Atherosclerosis (ICD10-I70.0) and Emphysema (ICD10-J43.9). Electronically Signed   By: Donnal Fusi M.D.   On: 02/22/2024 11:53   MR Brain W and Wo Contrast Result Date: 02/22/2024 CLINICAL DATA:  58 year old female with altered mental status. Metastatic non-small cell lung cancer, treated brain metastasis. EXAM: MRI HEAD WITHOUT AND WITH CONTRAST TECHNIQUE: Multiplanar, multiecho pulse sequences of the brain and surrounding structures were obtained without and with intravenous contrast. CONTRAST:  5mL GADAVIST  GADOBUTROL  1 MMOL/ML IV SOLN COMPARISON:  Head CT yesterday.  Restaging MRI 12/14/2023. FINDINGS: Brain: Confluent T2 and FLAIR hyperintensity in the left hemisphere persists. Mass effect on the right lateral ventricle has regressed since April. Mild rightward midline shift has not significantly changed (series 13, image 30). Underlying left anterior frontal operculum epicenter enhancing mass there is larger, now up to 33 x 25 x 22 mm (AP by transverse by CC), 2 cm long axis in April. The mass is mostly Iso intense on T2. And is partially diffusion restricted. Hemosiderin is stable to regressed. No other abnormal intra-axial brain enhancement. And no abnormal dural thickening or enhancement despite progressed skull disease detailed  below. No superimposed diffusion restriction suggestive of acute infarction. No increased mass affect, ventriculomegaly, extra-axial collection or acute intracranial hemorrhage. Cervicomedullary junction and pituitary are within normal limits. No new blood products. Patchy and widely scattered additional chronic white matter T2 and FLAIR hyperintensity is stable. Vascular: Major intracranial vascular flow voids are stable. Following contrast the major dural venous sinuses are enhancing and appear to be patent. Skull and upper cervical spine: Substantially new/progressed since April lytic and expansile right superior skull metastasis, enhancing and 28 by 26 x 14 mm. Extension  superficially into the scalp more so than intracranially (series 19, image 19). Smaller nearby enhancing right lateral skull metastasis series 20, image 120.-skull base and visible cervical vertebral marrow signal remain normal. Negative visible cervical spinal cord. Sinuses/Orbits: Stable, negative. Other: Stable mild right mastoid effusion. Visible internal auditory structures appear normal. IMPRESSION: 1. Treated left hemisphere brain metastasis is Larger since 12/14/2023, now up to 33 mm long axis (previously 20 mm), although confluent surrounding T2/FLAIR hyperintensity has not significantly changed, and mild regional mass effect has regressed. This is indeterminate for pseudoprogression versus true tumor progression. No new parenchymal brain metastasis. 2. Progressed right lateral convexity skull metastasis, large in expansile up to 2.8 cm long axis. Extension into the scalp more so than intracranially. No pachymeningeal thickening or underlying cerebral edema at this time. Electronically Signed   By: Marlise Simpers M.D.   On: 02/22/2024 11:06   CT HEAD WO CONTRAST Result Date: 02/21/2024 CLINICAL DATA:  Mental status change, unknown cause. Edema of the lower leg and feet for the past few days. Hx of Brain Cancer. Family reports pt had brain  surgery a month ago, since then pt baseline has been difficulty with communication and agitation due to the same difficulties EXAM: CT HEAD WITHOUT CONTRAST TECHNIQUE: Contiguous axial images were obtained from the base of the skull through the vertex without intravenous contrast. RADIATION DOSE REDUCTION: This exam was performed according to the departmental dose-optimization program which includes automated exposure control, adjustment of the mA and/or kV according to patient size and/or use of iterative reconstruction technique. COMPARISON:  MRI head 12/14/2023 FINDINGS: Brain: Redemonstration of left frontotemporal encephalomalacia with known underlying mass better evaluated on MRI 12/14/2023 (2:18). Patchy and confluent areas of decreased attenuation are noted throughout the deep and periventricular white matter of the cerebral hemispheres bilaterally, compatible with chronic microvascular ischemic disease. No evidence of large-territorial acute infarction. No parenchymal hemorrhage. No new parenchymal mass lesion. No extra-axial collection. No mass effect or midline shift. No hydrocephalus. Basilar cisterns are patent. Vascular: No hyperdense vessel. Skull: Interval development of lytic calvarial fracture with question underlying soft tissue mass (2:25). Prior left frontal craniotomy. Sinuses/Orbits: Paranasal sinuses and mastoid air cells are clear. The orbits are unremarkable. Other: None. IMPRESSION: 1. Interval development of lytic calvarial fracture with question underlying soft tissue mass. Finding concerning for metastasis. Recommend MRI with and without contrast for further evaluation. 2. Redemonstration of left frontotemporal encephalomalacia with known underlying mass better evaluated on MRI 12/14/2023. Electronically Signed   By: Morgane  Naveau M.D.   On: 02/21/2024 19:50   DG Chest Port 1 View Result Date: 02/21/2024 CLINICAL DATA:  weakness EXAM: PORTABLE CHEST 1 VIEW COMPARISON:  Chest x-ray  02/06/2024 FINDINGS: The heart and mediastinal contours are within normal limits. Redemonstration of chronic coarsened interstitial markings at the lung bases with likely superimposed mild pulmonary edema. Biapical pleural/pulmonary scarring. No focal consolidation. No pleural effusion. No pneumothorax. No acute osseous abnormality. IMPRESSION: Mild pulmonary edema.  Underlying pulmonary fibrosis. Electronically Signed   By: Morgane  Naveau M.D.   On: 02/21/2024 19:25       Kasheem Toner M.D. [facility] 02/22/2024, 2:56 PM  Available via Epic secure chat 7am-7pm After 7 pm, please refer to night coverage provider listed on amion.

## 2024-02-22 NOTE — Plan of Care (Signed)
  Problem: Education: Goal: Knowledge of General Education information will improve Description: Including pain rating scale, medication(s)/side effects and non-pharmacologic comfort measures Outcome: Progressing   Problem: Elimination: Goal: Will not experience complications related to bowel motility Outcome: Progressing   Problem: Pain Managment: Goal: General experience of comfort will improve and/or be controlled Outcome: Progressing   Problem: Safety: Goal: Ability to remain free from injury will improve Outcome: Progressing   Problem: Skin Integrity: Goal: Risk for impaired skin integrity will decrease Outcome: Progressing

## 2024-02-22 NOTE — H&P (Signed)
 History and Physical    Regina [name] ZOX:[id number] DOB: [birth year] DOA: 02/21/2024  PCP: Marius Siemens, NP   Chief Complaint:  aphasia, swelling  HPI: Regina [name] is a 58 y.o. female with medical history significant of COPD, hypertension, metastatic lung cancer status post craniotomy and pathologic fracture status post radiation who is currently on immunotherapy.  She is presented to the hospital via EMS due to worsening lower extremity swelling and poor appetite.  Patient also had changes in her speech.  She has had longstanding issues with speech difficulties however her aphasia has worsened.  She presented to the ER for further assessment.  On arrival she was tachycardic in low 100s and hemodynamically stable.  Labs were obtained which showed potassium 2.5, creatinine 0.40, WBC 9.8, hemoglobin 12.3, magnesium  1.5, D-dimer 3.9.  Patient underwent chest x-ray which showed pulmonary edema.  CT head showed interval development of lytic fracture and soft tissue mass concerning for metastasis with recommended MRI.  Patient was admitted further workup.   Review of Systems: Review of Systems  Constitutional:  Negative for chills and fever.  HENT: Negative.    Eyes: Negative.   Respiratory: Negative.    Cardiovascular:  Positive for leg swelling.  Gastrointestinal: Negative.   Genitourinary: Negative.   Musculoskeletal: Negative.   Skin: Negative.   Neurological: Negative.   Endo/Heme/Allergies: Negative.   Psychiatric/Behavioral: Negative.       As per HPI otherwise 10 point review of systems negative.   Allergies  Allergen Reactions   Pork Allergy Nausea And Vomiting   Bee Venom Hives    Past Medical History:  Diagnosis Date   Allergy    Anxiety    Brain mass    Cancer (HCC)    COPD (chronic obstructive pulmonary disease) (HCC)    Emphysema of lung (HCC)    Hypertension     Past Surgical History:  Procedure Laterality Date   APPLICATION OF CRANIAL NAVIGATION  Left 10/07/2023   Procedure: APPLICATION OF CRANIAL NAVIGATION;  Surgeon: Audie Bleacher, MD;  Location: [facility] OR;  Service: Neurosurgery;  Laterality: Left;   CRANIOTOMY Left 10/07/2023   Procedure: FRONTAL CRANIOTOMY FOR ABSCESS;  Surgeon: Audie Bleacher, MD;  Location: [facility] OR;  Service: Neurosurgery;  Laterality: Left;   dental procedure     HUMERUS IM NAIL Right 10/10/2023   Procedure: INTRAMEDULLARY (IM) NAIL HUMERAL RIGHT;  Surgeon: Wilhelmenia Harada, MD;  Location: [facility] OR;  Service: Orthopedics;  Laterality: Right;     reports that she quit smoking about 4 years ago. Her smoking use included cigarettes. She has never used smokeless tobacco. She reports current alcohol  use of about 6.0 standard drinks of alcohol  per week. She reports current drug use. Frequency: 4.00 times per week. Drug: Marijuana.  Family History  Problem Relation Age of Onset   Leukemia Mother    Hypertension Mother    Hypertension Father    Cancer Neg Hx    Diabetes Neg Hx    Heart disease Neg Hx    Colon cancer Neg Hx    Esophageal cancer Neg Hx    Rectal cancer Neg Hx    Stomach cancer Neg Hx     Prior to Admission medications   Medication Sig Start Date End Date Taking? Authorizing Provider  acetaminophen  (TYLENOL ) 500 MG tablet Take 2 tablets (1,000 mg total) by mouth every 8 (eight) hours as needed for mild pain (pain score 1-3) or moderate pain (pain score 4-6). 02/01/24   Onetha Bile, MD  amLODipine  (  NORVASC ) 5 MG tablet Take 1 tablet (5 mg total) by mouth daily. 02/13/24   Heilingoetter, Cassandra L, PA-C  bisacodyl  (DULCOLAX) 10 MG suppository Place 1 suppository (10 mg total) rectally as needed for moderate constipation. 02/09/24   Sedalia Dacosta, MD  bisacodyl  (DULCOLAX) 5 MG EC tablet Take 1 tablet (5 mg total) by mouth daily as needed for moderate constipation. 02/09/24   Rizwan, Saima, MD  dexamethasone  (DECADRON ) 2 MG tablet Take 3 tablets (6 mg total) by mouth daily. 02/10/24   Rizwan, Saima, MD   diclofenac  Sodium (VOLTAREN ) 1 % GEL Apply 2 g topically 4 (four) times daily. Patient taking differently: Apply 2 g topically as needed. 12/10/23   Zelaya, Oscar A, PA-C  docusate sodium  (COLACE) 100 MG capsule Take 1 capsule (100 mg total) by mouth at bedtime. 02/09/24   Rizwan, Saima, MD  levETIRAcetam  (KEPPRA ) 500 MG tablet Take 1 tablet (500 mg total) by mouth 2 (two) times daily. 12/15/23   Ghimire, Estil Heman, MD  metoprolol  tartrate (LOPRESSOR ) 25 MG tablet Take 1 tablet (25 mg total) by mouth 2 (two) times daily. 12/15/23   Ghimire, Estil Heman, MD  oxyCODONE  (OXYCONTIN ) 15 mg 12 hr tablet Take 1 tablet (15 mg total) by mouth every 8 (eight) hours. 02/13/24   Pickenpack-Cousar, Athena N, NP  pantoprazole  (PROTONIX ) 40 MG tablet Take 1 tablet (40 mg total) by mouth daily. 12/15/23 12/14/24  Ghimire, Estil Heman, MD  polyethylene glycol (MIRALAX  / GLYCOLAX ) 17 g packet Mix 1 packet (17 g) in 4-8 ounces of water and take by mouth daily. 12/27/23   Audria Leather, MD  potassium chloride  SA (KLOR-CON  M) 20 MEQ tablet Take 1 tablet (20 mEq total) by mouth 2 (two) times daily. 01/16/24   Marlene Simas, MD  senna (SENOKOT) 8.6 MG TABS tablet Take 1 tablet (8.6 mg total) by mouth 2 (two) times daily. 12/27/23   Audria Leather, MD  senna-docusate (SENOKOT-S) 8.6-50 MG tablet Take 2 tablets by mouth at bedtime. 02/09/24   Rizwan, Saima, MD  sodium phosphate  (FLEET) ENEM Place 133 mLs (1 enema total) rectally daily as needed for severe constipation. 02/09/24   Sedalia Dacosta, MD    Physical Exam: Vitals:   02/21/24 2138 02/21/24 2358 02/22/24 0157 02/22/24 0200  BP:  134/74  137/86  Pulse: (!) 110 83  85  Resp: (!) 27 18  19   Temp:  98.5 F (36.9 C) 98 F (36.7 C)   TempSrc:  Oral Oral   SpO2: 98% 94%  97%   Physical Exam Constitutional:      Appearance: She is normal weight.  HENT:     Head: Normocephalic.     Right Ear: Tympanic membrane normal.     Nose: Nose normal.     Mouth/Throat:     Mouth:  Mucous membranes are moist.     Pharynx: Oropharynx is clear.   Eyes:     Pupils: Pupils are equal, round, and reactive to light.    Cardiovascular:     Rate and Rhythm: Regular rhythm. Tachycardia present.     Pulses: Normal pulses.  Pulmonary:     Effort: Pulmonary effort is normal.  Abdominal:     General: Abdomen is flat.   Musculoskeletal:        General: Normal range of motion.     Cervical back: Normal range of motion.   Skin:    General: Skin is warm.     Capillary Refill: Capillary refill takes less than 2  seconds.   Neurological:     General: No focal deficit present.     Mental Status: She is alert.       Labs on Admission: I have personally reviewed the patients's labs and imaging studies.  Assessment/Plan Principal Problem:   Aphasia   # Expressive aphasia - Patient has reported history of speech difficulties - Possibly worsened and difficult to communicate on examination - No other new focal deficits - New lytic lesion on CT  Plan: Obtain MRI head  # Bilateral lower extremity swelling - Patient's chief complaint was swelling in bilateral lower extremities - D-dimer elevated - Mild shortness of breath - Hypervolemia  Plan: Obtain CTA pulmonary embolism study to assess for PE  # History of metastatic lung cancer-continue to monitor.  Patient is currently on immunotherapy.  Continue steroids, Keppra  for seizure prophylaxis  # Hypertension-continue metoprolol , amlodipine   # Hypokalemia-replete potassium  # Chronic cancer-related pain-Home pain regimen  # Hypomagnesemia-replete magnesium     Admission status: Inpatient Telemetry  Certification: The appropriate patient status for this patient is INPATIENT. Inpatient status is judged to be reasonable and necessary in order to provide the required intensity of service to ensure the patient's safety. The patient's presenting symptoms, physical exam findings, and initial radiographic and  laboratory data in the context of their chronic comorbidities is felt to place them at high risk for further clinical deterioration. Furthermore, it is not anticipated that the patient will be medically stable for discharge from the hospital within 2 midnights of admission.   * I certify that at the point of admission it is my clinical judgment that the patient will require inpatient hospital care spanning beyond 2 midnights from the point of admission due to high intensity of service, high risk for further deterioration and high frequency of surveillance required.Myrl Askew MD [facility] If 7PM-7AM, please contact night-coverage [email or web address]  02/22/2024, 3:29 AM

## 2024-02-23 ENCOUNTER — Other Ambulatory Visit (HOSPITAL_COMMUNITY): Payer: Self-pay

## 2024-02-23 ENCOUNTER — Ambulatory Visit
Admission: RE | Admit: 2024-02-23 | Discharge: 2024-02-23 | Disposition: A | Discharge status: Home / Self Care | Payer: MEDICAID | Source: Ambulatory Visit | Attending: Radiation Oncology | Admitting: Radiation Oncology

## 2024-02-23 ENCOUNTER — Ambulatory Visit: Payer: MEDICAID

## 2024-02-23 ENCOUNTER — Other Ambulatory Visit: Payer: Self-pay

## 2024-02-23 DIAGNOSIS — G893 Neoplasm related pain (acute) (chronic): Secondary | ICD-10-CM

## 2024-02-23 DIAGNOSIS — G9389 Other specified disorders of brain: Secondary | ICD-10-CM | POA: Diagnosis not present

## 2024-02-23 DIAGNOSIS — M7989 Other specified soft tissue disorders: Secondary | ICD-10-CM | POA: Diagnosis not present

## 2024-02-23 DIAGNOSIS — C3490 Malignant neoplasm of unspecified part of unspecified bronchus or lung: Secondary | ICD-10-CM

## 2024-02-23 DIAGNOSIS — R4701 Aphasia: Secondary | ICD-10-CM | POA: Diagnosis not present

## 2024-02-23 DIAGNOSIS — C3411 Malignant neoplasm of upper lobe, right bronchus or lung: Secondary | ICD-10-CM

## 2024-02-23 DIAGNOSIS — C7951 Secondary malignant neoplasm of bone: Secondary | ICD-10-CM

## 2024-02-23 LAB — RAD ONC ARIA SESSION SUMMARY
Course Elapsed Days: 15
Plan Fractions Treated to Date: 7
Plan Prescribed Dose Per Fraction: 3 Gy
Plan Total Fractions Prescribed: 10
Plan Total Prescribed Dose: 30 Gy
Reference Point Dosage Given to Date: 21 Gy
Reference Point Session Dosage Given: 3 Gy
Session Number: 7

## 2024-02-23 MED ORDER — OXYCODONE HCL 10 MG PO TABS
10.0000 mg | ORAL_TABLET | ORAL | 0 refills | Status: AC | PRN
  Filled 2024-02-23: qty 60, 7d supply, fill #0

## 2024-02-23 MED ORDER — LEVETIRACETAM 500 MG PO TABS
500.0000 mg | ORAL_TABLET | Freq: Two times a day (BID) | ORAL | 1 refills | Status: DC
  Filled 2024-02-23: qty 60, 30d supply, fill #0

## 2024-02-23 MED ORDER — ONDANSETRON HCL 4 MG PO TABS
4.0000 mg | ORAL_TABLET | Freq: Four times a day (QID) | ORAL | 0 refills | Status: DC | PRN
  Filled 2024-02-23: qty 20, 5d supply, fill #0

## 2024-02-23 MED ORDER — DEXAMETHASONE 2 MG PO TABS
6.0000 mg | ORAL_TABLET | Freq: Every day | ORAL | 0 refills | Status: DC
  Filled 2024-02-23: qty 30, 10d supply, fill #0

## 2024-02-23 MED ORDER — UNABLE TO FIND: 0 refills | Status: DC

## 2024-02-23 MED ORDER — POTASSIUM CHLORIDE CRYS ER 20 MEQ PO TBCR
20.0000 meq | EXTENDED_RELEASE_TABLET | Freq: Two times a day (BID) | ORAL | 0 refills | Status: DC
Start: 2024-02-23 — End: 2024-04-06
  Filled 2024-02-23: qty 10, 5d supply, fill #0

## 2024-02-23 MED ORDER — OXYCODONE HCL ER 15 MG PO T12A
15.0000 mg | EXTENDED_RELEASE_TABLET | Freq: Three times a day (TID) | ORAL | 0 refills | Status: DC
  Filled 2024-02-23: qty 60, 20d supply, fill #0

## 2024-02-23 NOTE — Progress Notes (Signed)
 AVS reviewed w/ pt who verbalized an understanding- no other questions- tele & PIV left in place until pt is done w/ radiation. Pt also waiting for walker to be delivered . No other questions at this time

## 2024-02-23 NOTE — Discharge Summary (Signed)
 Physician Discharge Summary   Patient: Regina [name] MRN: [id number] DOB: October 26, 1965  Admit date:     02/21/2024  Discharge date: 02/23/24  Discharge Physician: Bertram Brocks, MD   PCP: Marius Siemens, NP   Recommendations at discharge:   Continue dexamethasone  6 mg daily Continue Keppra  500 mg BID Continue XRT treatment as outlined, will complete on Tuesday, 02/28/2024 Outpatient follow-up with oncology, radiation oncology, neuro-oncology  Discharge Diagnoses: :   Aphasia Brain metastasis Metastatic lung cancer status post craniotomy  COPD Essential hypertension Hypokalemia   Hospital Course:  Patient is a 58 year old female with COPD, HTN, metastatic lung CA status post craniotomy, pathologic fracture, XRT, currently on immunotherapy presented to ED with worsening lower extremity swelling and poor appetite.  Also reported changes in her speech.  She has had longstanding issues with speech difficulties however felt her aphasia had worsened. On arrival, noted to be tachycardiac, potassium 2.5, creatinine 0.4, magnesium  1.5, D-dimer 3.9. Chest x-ray showed pulmonary edema, CT head showed interval development of lytic fracture and soft tissue mass concerning for metastasis with recommended MRI   Assessment and Plan:  Aphasia, brain metastasis - CT head showed interval development of lytic calvarial fracture with question underlying soft tissue mass, finding concerning for metastasis, recommended MRI - MRI of the brain showed treated left hemisphere brain metastasis is larger since 12/14/2023 now up to 33 mm long axis, indeterminate for pseudo progression versus true tumor progression, no new parenchymal brain metastasis.  Progressed right lateral convexity skull metastasis, up to 2.8 cm long axis -EDP discussed with neurosurgery on-call, who did not feel there was any acute neurosurgical issue, recommended MRI. - She has chronic speech difficulty, much improving today.  -  Discussed with neuro-oncology, Dr. Mark Sil, recommended if aphasia has improved, patient can be discharged home, continue dexamethasone  and Keppra   will review in tumor board for further management and follow-up outpatient in office -PT recommended home health PT OT, rolling walker   Hypokalemia -Replaced     COPD - Currently no acute wheezing,      Metastatic lung CA Lower extremity swelling - CT angiogram chest negative for acute PE, interval increase in size of right apical nodule, mildly progressive hilar and axillary lymphadenopathy, emphysema - Venous Doppler lower extremity negative for DVT -Currently receiving radiation therapy for right humerus to palliate pain, will complete XRT on 02/24/2024   Estimated body mass index is 19.74 kg/m as calculated from the following:   Height as of 02/13/24: 5' 6 (1.676 m).   Weight as of 02/13/24: 55.5 kg.        Pain control - [place]  Controlled Substance Reporting System database was reviewed. and patient was instructed, not to drive, operate heavy machinery, perform activities at heights, swimming or participation in water activities or provide baby-sitting services while on Pain, Sleep and Anxiety Medications; until their outpatient Physician has advised to do so again. Also recommended to not to take more than prescribed Pain, Sleep and Anxiety Medications.  Consultants: Radiation oncology, neuro-oncology, oncology via phone Procedures performed: None Disposition: Home Diet recommendation:  Discharge Diet Orders (From admission, onward)     Start     Ordered   02/23/24 0000  Diet general        02/23/24 1048            DISCHARGE MEDICATION: Allergies as of 02/23/2024       Reactions   Pork Allergy Nausea And Vomiting   Bee Venom Hives  Medication List     TAKE these medications    acetaminophen  500 MG tablet Commonly known as: TYLENOL  Take 2 tablets (1,000 mg total) by mouth every 8 (eight) hours as  needed for mild pain (pain score 1-3) or moderate pain (pain score 4-6).   amLODipine  5 MG tablet Commonly known as: NORVASC  Take 1 tablet (5 mg total) by mouth daily.   bisacodyl  5 MG EC tablet Generic drug: bisacodyl  Take 1 tablet (5 mg total) by mouth daily as needed for moderate constipation.   bisacodyl  10 MG suppository Commonly known as: Dulcolax Place 1 suppository (10 mg total) rectally as needed for moderate constipation.   dexamethasone  2 MG tablet Commonly known as: DECADRON  Take 3 tablets (6 mg total) by mouth daily.   diclofenac  Sodium 1 % Gel Commonly known as: Voltaren  Apply 2 g topically 4 (four) times daily. What changed:  when to take this reasons to take this   docusate sodium  100 MG capsule Commonly known as: COLACE Take 1 capsule (100 mg total) by mouth at bedtime.   levETIRAcetam  500 MG tablet Commonly known as: KEPPRA  Take 1 tablet (500 mg total) by mouth 2 (two) times daily.   metoprolol  tartrate 25 MG tablet Commonly known as: LOPRESSOR  Take 1 tablet (25 mg total) by mouth 2 (two) times daily.   ondansetron  4 MG tablet Commonly known as: ZOFRAN  Take 1 tablet (4 mg total) by mouth every 6 (six) hours as needed for nausea or vomiting.   Oxycodone  HCl 10 MG Tabs Take 1-1.5 tablets (10-15 mg total) by mouth every 4 (four) hours as needed for up to 7 days.   oxyCODONE  15 mg 12 hr tablet Commonly known as: OXYCONTIN  Take 1 tablet (15 mg total) by mouth every 8 (eight) hours.   pantoprazole  40 MG tablet Commonly known as: Protonix  Take 1 tablet (40 mg total) by mouth daily.   polyethylene glycol 17 g packet Commonly known as: MIRALAX  / GLYCOLAX  Mix 1 packet (17 g) in 4-8 ounces of water and take by mouth daily.   potassium chloride  SA 20 MEQ tablet Commonly known as: KLOR-CON  M Take 1 tablet (20 mEq total) by mouth 2 (two) times daily.   sodium phosphate  Enem Place 133 mLs (1 enema total) rectally daily as needed for severe  constipation.   Stool Softener/Laxative 50-8.6 MG tablet Generic drug: senna-docusate Take 2 tablets by mouth at bedtime.   UNABLE TO FIND Left AFO  For foot drop               Durable Medical Equipment  (From admission, onward)           Start     Ordered   02/23/24 1055  For home use only DME Walker rolling  Once       Question Answer Comment  Walker: With 5 Inch Wheels   Patient needs a walker to treat with the following condition Weakness      02/23/24 1055            Follow-up Information     Marius Siemens, NP. Schedule an appointment as soon as possible for a visit in 2 week(s).   Specialty: Internal Medicine Why: for hospital follow-up Contact information: [address] [place]  [address] [phone]         Vaslow, Zachary K, MD. Schedule an appointment as soon as possible for a visit in 2 week(s).   Specialties: Psychiatry, Neurology, Oncology Why: for hospital follow-up Contact information: [address] [place] [address] [phone]         Marlene Simas, MD Follow up on 03/05/2024.   Specialty: Oncology Why: at 8:30AM Contact information: [address] [place] [address] [phone]                Discharge Exam:  No complaints, has XRT today to right humerus at 1:45 PM, cleared by radiation oncology to discharge home after the session.  Patient aware to return for tomorrow's session and will complete XRT on Tuesday, 02/28/2024.  Speech clear, no acute FND's.  BP 132/78 (BP Location: Left Arm)   Pulse 80   Temp 98.3 F (36.8 C) (Oral)   Resp 17   LMP 06/06/2010   SpO2 98%    Physical Exam General: Alert and oriented x 3, NAD, speech improving Cardiovascular: S1 S2 clear, RRR.  Respiratory: CTAB, no wheezing Gastrointestinal: Soft, nontender, nondistended, NBS Ext: no pedal edema bilaterally Neuro: no new deficits Psych: Normal affect    Condition at discharge:  fair  The results of significant diagnostics from this hospitalization (including imaging, microbiology, ancillary and laboratory) are listed below for reference.   Imaging Studies: VAS US  LOWER EXTREMITY VENOUS (DVT) (ONLY [facility] & [facility]) Result Date: 02/22/2024  Lower Venous DVT Study Patient Name:  MYSTIQUE BJELLAND  Date of Exam:   02/22/2024 Medical Rec #: [id number]       Accession #:    [id number] Date of Birth: Feb 02, 1966       Patient Gender: F Patient Age:   58 years Exam Location:  [facility] Procedure:      VAS US  LOWER EXTREMITY VENOUS (DVT) Referring Phys: Myrl Askew --------------------------------------------------------------------------------  Indications: Swelling.  Risk Factors: Metastatic lung cancer on chemotherapy. Comparison Study: No previous exams Performing Technologist: Jody Hill RVT, RDMS  Examination Guidelines: A complete evaluation includes B-mode imaging, spectral Doppler, color Doppler, and power Doppler as needed of all accessible portions of each vessel. Bilateral testing is considered an integral part of a complete examination. Limited examinations for reoccurring indications may be performed as noted. The reflux portion of the exam is performed with the patient in reverse Trendelenburg.  +-----+---------------+---------+-----------+----------+--------------+ RIGHTCompressibilityPhasicitySpontaneityPropertiesThrombus Aging +-----+---------------+---------+-----------+----------+--------------+ CFV  Full           Yes      Yes                                 +-----+---------------+---------+-----------+----------+--------------+   +---------+---------------+---------+-----------+----------+--------------+ LEFT     CompressibilityPhasicitySpontaneityPropertiesThrombus Aging +---------+---------------+---------+-----------+----------+--------------+ CFV      Full           Yes      Yes                                  +---------+---------------+---------+-----------+----------+--------------+ SFJ      Full                                                        +---------+---------------+---------+-----------+----------+--------------+ FV Prox  Full           Yes      Yes                                 +---------+---------------+---------+-----------+----------+--------------+  FV Mid   Full           Yes      Yes                                 +---------+---------------+---------+-----------+----------+--------------+ FV DistalFull           Yes      Yes                                 +---------+---------------+---------+-----------+----------+--------------+ PFV      Full                                                        +---------+---------------+---------+-----------+----------+--------------+ POP      Full           Yes      Yes                                 +---------+---------------+---------+-----------+----------+--------------+ PTV      Full                                                        +---------+---------------+---------+-----------+----------+--------------+ PERO     Full                                                        +---------+---------------+---------+-----------+----------+--------------+     Summary: RIGHT: - No evidence of common femoral vein obstruction.  LEFT: - There is no evidence of deep vein thrombosis in the lower extremity.  - No cystic structure found in the popliteal fossa. Subcutaneous edema of calf and ankle.  *See table(s) above for measurements and observations. Electronically signed by Irvin Mantel on 02/22/2024 at 6:58:16 PM.    Final    CT Angio Chest Pulmonary Embolism (PE) W or WO Contrast Result Date: 02/22/2024 CLINICAL DATA:  Shortness of breath. Leg swelling. Metastatic lung cancer. Clinical concern for pulmonary embolus. EXAM: CT ANGIOGRAPHY CHEST WITH CONTRAST TECHNIQUE: Multidetector CT imaging of the  chest was performed using the standard protocol during bolus administration of intravenous contrast. Multiplanar CT image reconstructions and MIPs were obtained to evaluate the vascular anatomy. RADIATION DOSE REDUCTION: This exam was performed according to the departmental dose-optimization program which includes automated exposure control, adjustment of the mA and/or kV according to patient size and/or use of iterative reconstruction technique. CONTRAST:  75mL OMNIPAQUE  IOHEXOL  350 MG/ML SOLN COMPARISON:  01/28/2024 FINDINGS: Cardiovascular: The heart is enlarged. No substantial pericardial effusion. Mild atherosclerotic calcification is noted in the wall of the thoracic aorta. There is no filling defect within the opacified pulmonary arteries to suggest the presence of an acute pulmonary embolus. Mediastinum/Nodes: Upper normal mediastinal lymph nodes are similar to prior. 12 mm short axis right hilar node on 37/4 was 8 mm previously. 13  mm short axis left hilar node on 40/4 was 10 mm previously. 19 mm short axis right inferior hilar node on 50/4 was 12 mm short axis previously. The esophagus has normal imaging features. Right axillary and subpectoral lymphadenopathy again noted. Index right-sided node measured previously at 13 mm is 13 mm again today on image 21/4. Lungs/Pleura: Advanced changes of centrilobular and paraseptal emphysema noted with diffuse bilateral subpleural reticulation basilar predominant subpleural honeycombing, features consistent with fibrosis. No substantial pleural effusion. Right apical nodule measured previously at 2.3 x 1.6 cm is now 2.5 x 2.4 cm on image 28/6. no new focal dense consolidative opacity. Basilar interstitial markings and ground-glass opacity in the lower lobes is progressive in the interval, likely technical as appearance is similar to that of a study from 12/12/2023. Upper Abdomen: Visualized portion of the upper abdomen shows no acute findings. Musculoskeletal: No  worrisome lytic or sclerotic osseous abnormality. Status post ORIF right humerus. Review of the MIP images confirms the above findings. IMPRESSION: 1. No CT evidence for acute pulmonary embolus. 2. Interval increase in size of the right apical nodule. 3. Hilar and axillary lymphadenopathy is mildly progressive in the interval. 4. Stable right axillary and subpectoral lymphadenopathy. 5. Emphysema superimposed on chronic interstitial lung disease/pulmonary fibrosis. 6. Aortic Atherosclerosis (ICD10-I70.0) and Emphysema (ICD10-J43.9). Electronically Signed   By: Donnal Fusi M.D.   On: 02/22/2024 11:53   MR Brain W and Wo Contrast Result Date: 02/22/2024 CLINICAL DATA:  58 year old female with altered mental status. Metastatic non-small cell lung cancer, treated brain metastasis. EXAM: MRI HEAD WITHOUT AND WITH CONTRAST TECHNIQUE: Multiplanar, multiecho pulse sequences of the brain and surrounding structures were obtained without and with intravenous contrast. CONTRAST:  5mL GADAVIST  GADOBUTROL  1 MMOL/ML IV SOLN COMPARISON:  Head CT yesterday.  Restaging MRI 12/14/2023. FINDINGS: Brain: Confluent T2 and FLAIR hyperintensity in the left hemisphere persists. Mass effect on the right lateral ventricle has regressed since April. Mild rightward midline shift has not significantly changed (series 13, image 30). Underlying left anterior frontal operculum epicenter enhancing mass there is larger, now up to 33 x 25 x 22 mm (AP by transverse by CC), 2 cm long axis in April. The mass is mostly Iso intense on T2. And is partially diffusion restricted. Hemosiderin is stable to regressed. No other abnormal intra-axial brain enhancement. And no abnormal dural thickening or enhancement despite progressed skull disease detailed below. No superimposed diffusion restriction suggestive of acute infarction. No increased mass affect, ventriculomegaly, extra-axial collection or acute intracranial hemorrhage. Cervicomedullary junction  and pituitary are within normal limits. No new blood products. Patchy and widely scattered additional chronic white matter T2 and FLAIR hyperintensity is stable. Vascular: Major intracranial vascular flow voids are stable. Following contrast the major dural venous sinuses are enhancing and appear to be patent. Skull and upper cervical spine: Substantially new/progressed since April lytic and expansile right superior skull metastasis, enhancing and 28 by 26 x 14 mm. Extension superficially into the scalp more so than intracranially (series 19, image 19). Smaller nearby enhancing right lateral skull metastasis series 20, image 120.-skull base and visible cervical vertebral marrow signal remain normal. Negative visible cervical spinal cord. Sinuses/Orbits: Stable, negative. Other: Stable mild right mastoid effusion. Visible internal auditory structures appear normal. IMPRESSION: 1. Treated left hemisphere brain metastasis is Larger since 12/14/2023, now up to 33 mm long axis (previously 20 mm), although confluent surrounding T2/FLAIR hyperintensity has not significantly changed, and mild regional mass effect has regressed. This is indeterminate for pseudoprogression versus  true tumor progression. No new parenchymal brain metastasis. 2. Progressed right lateral convexity skull metastasis, large in expansile up to 2.8 cm long axis. Extension into the scalp more so than intracranially. No pachymeningeal thickening or underlying cerebral edema at this time. Electronically Signed   By: Marlise Simpers M.D.   On: 02/22/2024 11:06   CT HEAD WO CONTRAST Result Date: 02/21/2024 CLINICAL DATA:  Mental status change, unknown cause. Edema of the lower leg and feet for the past few days. Hx of Brain Cancer. Family reports pt had brain surgery a month ago, since then pt baseline has been difficulty with communication and agitation due to the same difficulties EXAM: CT HEAD WITHOUT CONTRAST TECHNIQUE: Contiguous axial images were obtained  from the base of the skull through the vertex without intravenous contrast. RADIATION DOSE REDUCTION: This exam was performed according to the departmental dose-optimization program which includes automated exposure control, adjustment of the mA and/or kV according to patient size and/or use of iterative reconstruction technique. COMPARISON:  MRI head 12/14/2023 FINDINGS: Brain: Redemonstration of left frontotemporal encephalomalacia with known underlying mass better evaluated on MRI 12/14/2023 (2:18). Patchy and confluent areas of decreased attenuation are noted throughout the deep and periventricular white matter of the cerebral hemispheres bilaterally, compatible with chronic microvascular ischemic disease. No evidence of large-territorial acute infarction. No parenchymal hemorrhage. No new parenchymal mass lesion. No extra-axial collection. No mass effect or midline shift. No hydrocephalus. Basilar cisterns are patent. Vascular: No hyperdense vessel. Skull: Interval development of lytic calvarial fracture with question underlying soft tissue mass (2:25). Prior left frontal craniotomy. Sinuses/Orbits: Paranasal sinuses and mastoid air cells are clear. The orbits are unremarkable. Other: None. IMPRESSION: 1. Interval development of lytic calvarial fracture with question underlying soft tissue mass. Finding concerning for metastasis. Recommend MRI with and without contrast for further evaluation. 2. Redemonstration of left frontotemporal encephalomalacia with known underlying mass better evaluated on MRI 12/14/2023. Electronically Signed   By: Morgane  Naveau M.D.   On: 02/21/2024 19:50   DG Chest Port 1 View Result Date: 02/21/2024 CLINICAL DATA:  weakness EXAM: PORTABLE CHEST 1 VIEW COMPARISON:  Chest x-ray 02/06/2024 FINDINGS: The heart and mediastinal contours are within normal limits. Redemonstration of chronic coarsened interstitial markings at the lung bases with likely superimposed mild pulmonary edema.  Biapical pleural/pulmonary scarring. No focal consolidation. No pleural effusion. No pneumothorax. No acute osseous abnormality. IMPRESSION: Mild pulmonary edema.  Underlying pulmonary fibrosis. Electronically Signed   By: Morgane  Naveau M.D.   On: 02/21/2024 19:25   DG Humerus Left Result Date: 02/06/2024 CLINICAL DATA:  Intractable pain. Non-small cell lung cancer with metastatic disease. EXAM: LEFT HUMERUS - 2+ VIEW COMPARISON:  None Available. FINDINGS: Slight cortical thinning in the lateral aspect of the mid humeral shaft which may represent an underlying lucent lesion. No pathologic fracture. Shoulder and elbow alignment are maintained. Unremarkable soft tissues. IMPRESSION: Slight cortical thinning in the lateral aspect of the mid humeral shaft which may represent an underlying lucent lesion. No pathologic fracture. Electronically Signed   By: Chadwick Colonel M.D.   On: 02/06/2024 20:50   DG Humerus Right Result Date: 02/06/2024 CLINICAL DATA:  Intractable pain. Metastatic non-small cell lung cancer. EXAM: RIGHT HUMERUS - 2+ VIEW COMPARISON:  Radiograph 12/19/2023 FINDINGS: Humeral intramedullary nail with proximal and distal locking screw fixation. The distal locking screw extends into the soft tissues by 13 mm, unchanged from most recent prior exam. There is slight increase in adjacent periosteal reaction. Previous lucency in the mid  humeral shaft has diminished with increasing bone stock. There is some periosteal reaction about the proximal aspect of the lesion. IMPRESSION: 1. Humeral intramedullary nail with proximal and distal locking screw fixation. The distal locking screw extends into the soft tissues by 13 mm, unchanged from most recent prior exam. There is increase in periosteal reaction which may be due to motion. 2. Previous lucent lesion in the mid humeral shaft has diminished with increasing bone stock. There is some periosteal reaction about the proximal aspect of the lesion.  Electronically Signed   By: Chadwick Colonel M.D.   On: 02/06/2024 20:48   DG Chest 2 View Result Date: 02/06/2024 CLINICAL DATA:  Shortness of breath with chest pain EXAM: CHEST - 2 VIEW COMPARISON:  Chest x-ray 01/31/2024.  CT of the chest 01/28/2024. FINDINGS: TAVR attic changes in the lung bases appear unchanged. Nodular density in the right lung apex is unchanged. There is no new focal lung infiltrate, pleural effusion or pneumothorax. The cardiomediastinal silhouette is within normal limits. IMPRESSION: 1. No active cardiopulmonary disease. 2. Stable nodular density in the right lung apex. Electronically Signed   By: Tyron Gallon M.D.   On: 02/06/2024 17:50   DG Chest Portable 1 View Result Date: 02/01/2024 CLINICAL DATA:  Chest pain and shortness of breath. EXAM: PORTABLE CHEST 1 VIEW COMPARISON:  Jan 27, 2024 FINDINGS: The heart size and mediastinal contours are within normal limits. Mild to moderate severity calcification of the aortic arch is seen. There is evidence of emphysematous lung disease, chronic appearing increased interstitial lung markings and bilateral areas of pulmonary fibrosis. There is stable biapical pleural thickening, scarring and/or atelectasis with hazy opacification along the lateral aspect of the right apex in the area of the patient's known right apical mass. No pleural effusion or pneumothorax is identified. A radiopaque intramedullary rod and proximal fixation screws are seen within the right humerus. An ill-defined lytic area is also seen along the medial aspect of the mid right humeral shaft. IMPRESSION: COPD and findings which likely correspond to the patient's known lateral right apical mass. Electronically Signed   By: Virgle Grime M.D.   On: 02/01/2024 00:12   CT Angio Chest PE W and/or Wo Contrast Result Date: 01/28/2024 CLINICAL DATA:  Pulmonary embolism (PE) suspected, high prob. Chest pain EXAM: CT ANGIOGRAPHY CHEST WITH CONTRAST TECHNIQUE: Multidetector CT  imaging of the chest was performed using the standard protocol during bolus administration of intravenous contrast. Multiplanar CT image reconstructions and MIPs were obtained to evaluate the vascular anatomy. RADIATION DOSE REDUCTION: This exam was performed according to the departmental dose-optimization program which includes automated exposure control, adjustment of the mA and/or kV according to patient size and/or use of iterative reconstruction technique. CONTRAST:  75mL OMNIPAQUE  IOHEXOL  350 MG/ML SOLN COMPARISON:  CT angio chest 12/12/2023, PET CT 10/27/2023 FINDINGS: Cardiovascular: Satisfactory opacification of the pulmonary arteries to the segmental level. No evidence of pulmonary embolism. The main pulmonary artery is normal in caliber. Normal heart size. No significant pericardial effusion. The thoracic aorta is normal in caliber. No atherosclerotic plaque of the thoracic aorta. No coronary artery calcifications. Mediastinum/Nodes: Redemonstration of right hilar lymphadenopathy with as an example a 1.1 cm lymph node (7:162). Redemonstration of right axillary lymphadenopathy. As an example a 1.3 cm lymph node (5:30). No enlarged mediastinal, left hilar, or axillary lymph nodes. Thyroid  gland, trachea, and esophagus demonstrate no significant findings. Lungs/Pleura: Paraseptal and centrilobular moderate emphysema. Bibasilar reticulations and lingular and lower lobe honeycombing. Interval decrease in  size of a 2.3 x 1.6 cm (from 2.7 x 1.9 cm) right apical nodular opacity. Persistent biapical pleural/pulmonary scarring. No pulmonary mass. Trace right apical loculated pleural effusion. No pneumothorax. Upper Abdomen: No acute abnormality. Musculoskeletal: No chest wall abnormality. Interval increase in size of a manubrial lytic lesion with associated 3.4 x 2.1 cm soft tissue density (9:72, 5:32). No acute displaced fracture. Multilevel degenerative changes of the spine. Review of the MIP images confirms the  above findings. IMPRESSION: 1. No pulmonary embolus. 2. Interval decrease in size of a 2.3 x 1.6 cm (from 2.7 x 1.9 cm) right apical nodular opacity consistent with known malignancy. 3. Persistent right hilar and axillary lymphadenopathy. 4. Interval increase in size of a metastatic osseous manubrial lytic lesion with associated 3.4 x 2.1 cm soft tissue density. 5. Trace right apical loculated pleural effusion. 6. Pulmonary fibrosis/interstitial lung disease as well as emphysema. 7. Aortic Atherosclerosis (ICD10-I70.0) and Emphysema (ICD10-J43.9). Electronically Signed   By: Morgane  Naveau M.D.   On: 01/28/2024 01:38   DG Chest 2 View Result Date: 01/27/2024 CLINICAL DATA:  Chest pain EXAM: CHEST - 2 VIEW COMPARISON:  Radiograph and CT 12/12/2023 FINDINGS: Stable cardiomediastinal silhouette. Aortic atherosclerotic calcification. Chronic interstitial lung disease with biapical pleuroparenchymal scarring, bronchitic changes, and reticular opacities in the lower lungs. The known right apical mass is redemonstrated and better evaluated on CT. No pleural effusion or pneumothorax. No displaced rib fracture. Lytic lesion in the right mid humerus. IMPRESSION: Chronic interstitial lung disease similar to 12/12/2023. Known right apical mass is redemonstrated. Lytic lesion in the right mid humerus. Electronically Signed   By: Rozell Cornet M.D.   On: 01/27/2024 23:03    Microbiology: Results for orders placed or performed during the hospital encounter of 12/12/23  Resp panel by RT-PCR (RSV, Flu A&B, Covid) Urine, Catheterized     Status: Abnormal   Collection Time: 12/12/23  5:52 PM   Specimen: Urine, Catheterized; Nasal Swab  Result Value Ref Range Status   SARS Coronavirus 2 by RT PCR POSITIVE (A) NEGATIVE Final   Influenza A by PCR NEGATIVE NEGATIVE Final   Influenza B by PCR NEGATIVE NEGATIVE Final    Comment: (NOTE) The Xpert Xpress SARS-CoV-2/FLU/RSV plus assay is intended as an aid in the diagnosis  of influenza from Nasopharyngeal swab specimens and should not be used as a sole basis for treatment. Nasal washings and aspirates are unacceptable for Xpert Xpress SARS-CoV-2/FLU/RSV testing.  Fact Sheet for Patients: BloggerCourse.com  Fact Sheet for Healthcare Providers: SeriousBroker.it  This test is not yet approved or cleared by the [place]  FDA and has been authorized for detection and/or diagnosis of SARS-CoV-2 by FDA under an Emergency Use Authorization (EUA). This EUA will remain in effect (meaning this test can be used) for the duration of the COVID-19 declaration under Section 564(b)(1) of the Act, 21 U.S.C. section 360bbb-3(b)(1), unless the authorization is terminated or revoked.     Resp Syncytial Virus by PCR NEGATIVE NEGATIVE Final    Comment: (NOTE) Fact Sheet for Patients: BloggerCourse.com  Fact Sheet for Healthcare Providers: SeriousBroker.it  This test is not yet approved or cleared by the [place]  FDA and has been authorized for detection and/or diagnosis of SARS-CoV-2 by FDA under an Emergency Use Authorization (EUA). This EUA will remain in effect (meaning this test can be used) for the duration of the COVID-19 declaration under Section 564(b)(1) of the Act, 21 U.S.C. section 360bbb-3(b)(1), unless the authorization is terminated or revoked.  Performed at  [facility], [address], [place], [place] [address]   Culture, blood (single) w Reflex to ID Panel     Status: None   Collection Time: 12/13/23  8:00 AM   Specimen: BLOOD LEFT HAND  Result Value Ref Range Status   Specimen Description BLOOD LEFT HAND  Final   Special Requests   Final    BOTTLES DRAWN AEROBIC AND ANAEROBIC Blood Culture results may not be optimal due to an inadequate volume of blood received in culture bottles   Culture   Final    NO GROWTH 5 DAYS Performed at  [facility], [address], [place], [place] [address]    Report Status 12/18/2023 FINAL  Final    Labs: CBC: Recent Labs  Lab 02/21/24 2006 02/21/24 2024 02/22/24 0418  WBC 9.8  --  9.9  NEUTROABS 6.9  --   --   HGB 12.3 13.3 11.9*  HCT 36.9 39.0 36.5  MCV 85.4  --  87.5  PLT 377  --  373   Basic Metabolic Panel: Recent Labs  Lab 02/21/24 2006 02/21/24 2024 02/22/24 0418  NA 135 134* 134*  K 2.5* 3.5 3.3*  CL 97* 96* 99  CO2 24  --  23  GLUCOSE 103* 97 93  BUN <5* <3* <5*  CREATININE 0.42* 0.50 0.40*  CALCIUM  9.2  --  8.6*  MG 1.5*  --   --    Liver Function Tests: Recent Labs  Lab 02/21/24 2006  AST 26  ALT 17  ALKPHOS 136*  BILITOT 0.5  PROT 6.3*  ALBUMIN 3.1*   CBG: Recent Labs  Lab 02/21/24 1953  GLUCAP 96    Discharge time spent: greater than 30 minutes.  Signed: Bertram Brocks, MD [facility] 02/23/2024

## 2024-02-23 NOTE — Progress Notes (Signed)
 Discharge medications delivered to bedside [facility]

## 2024-02-23 NOTE — Evaluation (Signed)
 Occupational Therapy Evaluation Patient Details Name: Regina [name] MRN: [id number] DOB: [birth year] Today's Date: 02/23/2024   History of Present Illness   58 year old female with COPD, HTN, metastatic lung CA status post craniotomy, pathologic fracture, XRT, currently on immunotherapy presented to ED with worsening lower extremity swelling and poor appetite.  Also reported changes in her speech.  She has had longstanding issues with speech difficulties however felt her aphasia had worsened. Imaging showed interval development of lytic fracture and soft tissue mass concerning for metastasis     Clinical Impressions PTA, patient was living at home with intermittent assistance from family but able to amb without AD and complete BADL's and light IADL's with independence with baseline aphasia. Currently, patient presents with deficits outlined below (see OT Problem list for details) most significantly increased generalized muscle weakness L>R, decreased UE ROM, L sided coordination, balance, activity tolerance and worsening aphasia impacting BADL's and functional mobility. Patient requires continued OT services while in acute hospital setting to progress function. OT recommending HHOT, shower seat and family support and assist which patient endorses is available post d/c hospital.      If plan is discharge home, recommend the following:   A little help with walking and/or transfers;A little help with bathing/dressing/bathroom;Assistance with cooking/housework;Assist for transportation;Help with stairs or ramp for entrance     Functional Status Assessment   Patient has had a recent decline in their functional status and demonstrates the ability to make significant improvements in function in a reasonable and predictable amount of time.     Equipment Recommendations   Tub/shower seat      Precautions/Restrictions   Precautions Precautions: Fall Restrictions Weight Bearing  Restrictions Per Provider Order: No     Mobility Bed Mobility Overal bed mobility: Needs Assistance Bed Mobility: Supine to Sit, Sit to Supine     Supine to sit: Modified independent (Device/Increase time)     General bed mobility comments: rails    Transfers Overall transfer level: Needs assistance Equipment used: Rolling walker (2 wheels) Transfers: Sit to/from Stand, Bed to chair/wheelchair/BSC Sit to Stand: Supervision     Step pivot transfers: Supervision     General transfer comment: amb to and from bathroom with noted increased L LE weakness      Balance Overall balance assessment: Mild deficits observed, not formally tested Sitting-balance support: No upper extremity supported Sitting balance-Leahy Scale: Normal     Standing balance support: During functional activity, Bilateral upper extremity supported, Reliant on assistive device for balance Standing balance-Leahy Scale: Fair                             ADL either performed or assessed with clinical judgement   ADL Overall ADL's : Needs assistance/impaired Eating/Feeding: Independent   Grooming: Wash/dry hands;Wash/dry face;Oral care;Sitting;Modified independent   Upper Body Bathing: Modified independent;Sitting   Lower Body Bathing: Supervison/ safety;Sit to/from stand   Upper Body Dressing : Modified independent;Sitting   Lower Body Dressing: Supervision/safety;Sit to/from stand   Toilet Transfer: Supervision/safety;Regular Toilet;Grab bars;Rolling walker (2 wheels);Ambulation       Tub/ Shower Transfer: Contact guard assist;Shower seat;Rolling walker (2 wheels) Tub/Shower Transfer Details (indicate cue type and reason): needs shower seat only has BSC Functional mobility during ADLs: Supervision/safety General ADL Comments: increased time due to mild weakness     Vision Baseline Vision/History: 0 No visual deficits Ability to See in Adequate Light: 0 Adequate  Pertinent Vitals/Pain Pain Assessment Pain Assessment: No/denies pain     Extremity/Trunk Assessment Upper Extremity Assessment Upper Extremity Assessment: Left hand dominant;Generalized weakness (overall decreased shoulder ROM patient reports is baseline due to CA)  L> R sided decreased coordination  Lower Extremity Assessment Lower Extremity Assessment: Defer to PT evaluation LLE Deficits / Details: foot drop noted while walking, pt unable to dorsiflex L ankle while sitting edge of bed, edema noted L midfoot, pt denies injury to L ankle/foot, reports sensation is intact to light touch LLE; knee ext +4/5, hip flexion -3/5 LLE Sensation: WNL LLE Coordination: decreased fine motor   Cervical / Trunk Assessment Cervical / Trunk Assessment: Normal   Communication Communication Communication: Impaired Factors Affecting Communication: Reduced clarity of speech;Difficulty expressing self;Other (comment) (baseline aphasia worsening as per patient report)   Cognition Arousal: Alert Behavior During Therapy: WFL for tasks assessed/performed                                 Following commands: Intact       Cueing  General Comments   Cueing Techniques: Verbal cues  no skin issues           Home Living Family/patient expects to be discharged to:: Private residence Living Arrangements: Other relatives (brother, neice and cousin) Available Help at Discharge: Family;Available 24 hours/day Type of Home: Apartment Home Access: Level entry     Home Layout: One level     Bathroom Shower/Tub: Tub/shower unit;Curtain   Firefighter: Standard Bathroom Accessibility: Yes How Accessible: Accessible via walker Home Equipment: BSC/3in1   Additional Comments: Pt lives with son and brother, 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      Prior Functioning/Environment Prior Level of Function : Independent/Modified Independent;Driving              Mobility Comments: Ambulatory without AD, denies any other falls ADLs Comments: left work due to medical issues, able to complete BADL's and has BSC now    OT Problem List: Decreased strength;Decreased activity tolerance;Impaired balance (sitting and/or standing);Decreased safety awareness   OT Treatment/Interventions: Self-care/ADL training;Therapeutic exercise;Neuromuscular education;Energy conservation;DME and/or AE instruction;Therapeutic activities;Patient/family education;Balance training      OT Goals(Current goals can be found in the care plan section)   Acute Rehab OT Goals Patient Stated Goal: to get stronger OT Goal Formulation: With patient Time For Goal Achievement: 03/08/24 Potential to Achieve Goals: Good ADL Goals Pt Will Perform Lower Body Bathing: with modified independence;sit to/from stand Pt Will Perform Lower Body Dressing: with modified independence;sit to/from stand Pt Will Transfer to Toilet: with modified independence;ambulating Pt Will Perform Toileting - Clothing Manipulation and hygiene: with modified independence;sit to/from stand Pt Will Perform Tub/Shower Transfer: Tub transfer;ambulating;shower seat Pt/caregiver will Perform Home Exercise Program: Increased strength;Independently;Both right and left upper extremity;With written HEP provided Additional ADL Goal #1: Patient will teach back 4/4 ECT's with ADL's and mobility indep   OT Frequency:  Min 2X/week       AM-PAC OT 6 Clicks Daily Activity     Outcome Measure Help from another person eating meals?: None Help from another person taking care of personal grooming?: A Little Help from another person toileting, which includes using toliet, bedpan, or urinal?: A Little Help from another person bathing (including washing, rinsing, drying)?: A Little Help from another person to put on and taking off regular upper body clothing?: A Little Help from another person to  put on and taking off  regular lower body clothing?: A Little 6 Click Score: 19   End of Session Equipment Utilized During Treatment: Gait belt;Rolling walker (2 wheels) Nurse Communication: Mobility status  Activity Tolerance: Patient tolerated treatment well Patient left: in bed;with bed alarm set;Other (comment) (EOB as per pt request)  OT Visit Diagnosis: Unsteadiness on feet (R26.81);Muscle weakness (generalized) (M62.81);Cognitive communication deficit (R41.841) Symptoms and signs involving cognitive functions:  (expressive aphasia)                Time: 0981-1914 OT Time Calculation (min): 22 min Charges:  OT General Charges $OT Visit: 1 Visit OT Evaluation $OT Eval Low Complexity: 1 Low  Kadince Boxley OT/L Acute Rehabilitation Department  [phone]  02/23/2024, 1:54 PM

## 2024-02-23 NOTE — Evaluation (Addendum)
 Physical Therapy Evaluation Patient Details Name: Regina [name] MRN: [id number] DOB: Sep 09, 1965 Today's Date: 02/23/2024  History of Present Illness  58 year old female with COPD, HTN, metastatic lung CA status post craniotomy, pathologic fracture, XRT, currently on immunotherapy presented to ED with worsening lower extremity swelling and poor appetite.  Also reported changes in her speech.  She has had longstanding issues with speech difficulties however felt her aphasia had worsened. Imaging showed interval development of lytic fracture and soft tissue mass concerning for metastasis  Clinical Impression  Pt admitted with above diagnosis. Pt presents with new L foot drop and associated steppage gait. She ambulated 71' with RW without loss of balance. Recommending L AFO and RW for home to reduce risk of falls.  Pt currently with functional limitations due to the deficits listed below (see PT Problem List). Pt will benefit from acute skilled PT to increase their independence and safety with mobility to allow discharge.           If plan is discharge home, recommend the following: A little help with bathing/dressing/bathroom;Assistance with cooking/housework;Assist for transportation;Help with stairs or ramp for entrance   Can travel by private vehicle        Equipment Recommendations Other (comment) (L AFO); rolling walker  Recommendations for Other Services       Functional Status Assessment Patient has had a recent decline in their functional status and demonstrates the ability to make significant improvements in function in a reasonable and predictable amount of time.     Precautions / Restrictions Precautions Precautions: Fall Recall of Precautions/Restrictions: Intact Precaution/Restrictions Comments: pt denies falls in past 6 months Restrictions Weight Bearing Restrictions Per Provider Order: No      Mobility  Bed Mobility Overal bed mobility: Needs Assistance Bed  Mobility: Supine to Sit     Supine to sit: Modified independent (Device/Increase time), HOB elevated, Used rails          Transfers Overall transfer level: Needs assistance Equipment used: Rolling walker (2 wheels) Transfers: Sit to/from Stand Sit to Stand: Supervision           General transfer comment: VCs hand placement    Ambulation/Gait Ambulation/Gait assistance: Supervision Gait Distance (Feet): 90 Feet Assistive device: Rolling walker (2 wheels) Gait Pattern/deviations: Steppage, Step-through pattern, Decreased stride length Gait velocity: decr     General Gait Details: noted steppage gait LLE, pt reports this is new, no loss of balance  Stairs            Wheelchair Mobility     Tilt Bed    Modified Rankin (Stroke Patients Only)       Balance   Sitting-balance support: No upper extremity supported Sitting balance-Leahy Scale: Normal     Standing balance support: During functional activity, Bilateral upper extremity supported, Reliant on assistive device for balance Standing balance-Leahy Scale: Fair                               Pertinent Vitals/Pain Pain Assessment Pain Assessment: No/denies pain Faces Pain Scale: No hurt    Home Living Family/patient expects to be discharged to:: Private residence Living Arrangements: Other relatives (brother, neice and cousin) Available Help at Discharge: Family;Available 24 hours/day Type of Home: Apartment Home Access: Level entry       Home Layout: One level Home Equipment: BSC/3in1 Additional Comments: Pt lives with son and brother, 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  Prior Function Prior Level of Function : Independent/Modified Independent;Driving             Mobility Comments: Ambulatory without AD, denies any other falls ADLs Comments: left work due to medical issues, able to complete BADL's and has BSC now     Extremity/Trunk Assessment    Upper Extremity Assessment Upper Extremity Assessment: Defer to OT evaluation    Lower Extremity Assessment Lower Extremity Assessment: LLE deficits/detail LLE Deficits / Details: foot drop noted while walking, pt unable to dorsiflex L ankle while sitting edge of bed, edema noted L midfoot, pt denies injury to L ankle/foot, reports sensation is intact to light touch LLE; knee ext +4/5, hip flexion -3/5 LLE Sensation: WNL LLE Coordination: decreased fine motor    Cervical / Trunk Assessment Cervical / Trunk Assessment: Normal  Communication   Communication Communication: Impaired Factors Affecting Communication: Reduced clarity of speech    Cognition Arousal: Alert Behavior During Therapy: WFL for tasks assessed/performed   PT - Cognitive impairments: No family/caregiver present to determine baseline, Difficult to assess Difficult to assess due to: Impaired communication                       Following commands: Intact       Cueing Cueing Techniques: Verbal cues     General Comments      Exercises     Assessment/Plan    PT Assessment Patient needs continued PT services  PT Problem List Decreased activity tolerance;Decreased mobility;Decreased strength       PT Treatment Interventions Gait training;Functional mobility training;Therapeutic activities;Therapeutic exercise;Balance training;Patient/family education    PT Goals (Current goals can be found in the Care Plan section)  Acute Rehab PT Goals Patient Stated Goal: go home PT Goal Formulation: With patient Time For Goal Achievement: 03/08/24 Potential to Achieve Goals: Fair    Frequency Min 3X/week     Co-evaluation               AM-PAC PT 6 Clicks Mobility  Outcome Measure Help needed turning from your back to your side while in a flat bed without using bedrails?: None Help needed moving from lying on your back to sitting on the side of a flat bed without using bedrails?: None Help  needed moving to and from a bed to a chair (including a wheelchair)?: None Help needed standing up from a chair using your arms (e.g., wheelchair or bedside chair)?: None Help needed to walk in hospital room?: A Little Help needed climbing 3-5 steps with a railing? : A Little 6 Click Score: 22    End of Session Equipment Utilized During Treatment: Gait belt Activity Tolerance: Patient tolerated treatment well Patient left: in bed;with bed alarm set;with call bell/phone within reach Nurse Communication: Mobility status PT Visit Diagnosis: Muscle weakness (generalized) (M62.81);Difficulty in walking, not elsewhere classified (R26.2);Other abnormalities of gait and mobility (R26.89)    Time: 8657-8469 PT Time Calculation (min) (ACUTE ONLY): 13 min   Charges:   PT Evaluation $PT Eval Moderate Complexity: 1 Mod   PT General Charges $$ ACUTE PT VISIT: 1 Visit        Daymon Deen PT 02/23/2024  Acute Rehabilitation Services  Office [phone]

## 2024-02-23 NOTE — Plan of Care (Signed)
  Problem: Education: Goal: Knowledge of General Education information will improve Description: Including pain rating scale, medication(s)/side effects and non-pharmacologic comfort measures Outcome: Progressing   Problem: Health Behavior/Discharge Planning: Goal: Ability to manage health-related needs will improve Outcome: Progressing   Problem: Clinical Measurements: Goal: Ability to maintain clinical measurements within normal limits will improve Outcome: Progressing Goal: Will remain free from infection Outcome: Progressing Goal: Diagnostic test results will improve Outcome: Progressing Goal: Respiratory complications will improve Outcome: Progressing Goal: Cardiovascular complication will be avoided Outcome: Progressing   Problem: Activity: Goal: Risk for activity intolerance will decrease Outcome: Progressing   Problem: Nutrition: Goal: Adequate nutrition will be maintained Outcome: Progressing   Problem: Pain Managment: Goal: General experience of comfort will improve and/or be controlled Outcome: Progressing   Problem: Skin Integrity: Goal: Risk for impaired skin integrity will decrease Outcome: Progressing

## 2024-02-24 ENCOUNTER — Ambulatory Visit: Admission: RE | Admit: 2024-02-24 | Payer: MEDICAID | Source: Ambulatory Visit

## 2024-02-24 ENCOUNTER — Other Ambulatory Visit: Payer: Self-pay

## 2024-02-24 ENCOUNTER — Other Ambulatory Visit: Payer: Self-pay | Admitting: Physician Assistant

## 2024-02-24 ENCOUNTER — Ambulatory Visit: Payer: MEDICAID

## 2024-02-24 ENCOUNTER — Other Ambulatory Visit (INDEPENDENT_AMBULATORY_CARE_PROVIDER_SITE_OTHER): Payer: Self-pay | Admitting: Primary Care

## 2024-02-24 ENCOUNTER — Other Ambulatory Visit (HOSPITAL_COMMUNITY): Payer: Self-pay

## 2024-02-24 ENCOUNTER — Telehealth: Payer: Self-pay | Admitting: *Deleted

## 2024-02-24 DIAGNOSIS — C3411 Malignant neoplasm of upper lobe, right bronchus or lung: Secondary | ICD-10-CM

## 2024-02-24 NOTE — Transitions of Care (Post Inpatient/ED Visit) (Signed)
   02/24/2024  Name: Regina [name] MRN: [id number] DOB: 06-15-1966  Today's TOC FU Call Status: Today's TOC FU Call Status:: Successful TOC FU Call Completed TOC FU Call Complete Date: 02/24/24 Patient's Name and Date of Birth confirmed.  Transition Care Management Follow-up Telephone Call Date of Discharge: 02/23/24 Discharge Facility: [facility]) Type of Discharge: Inpatient Admission Primary Inpatient Discharge Diagnosis:: Aphasia How have you been since you were released from the hospital?: Better Any questions or concerns?: No  Items Reviewed: Did you receive and understand the discharge instructions provided?: Yes Medications obtained,verified, and reconciled?: No Medications Not Reviewed Reasons:: Other: (Patient had to get off of the phone and unable to review medications) Any new allergies since your discharge?: No Dietary orders reviewed?: Yes Type of Diet Ordered:: general Do you have support at home?: Yes People in Home [RPT]: sibling(s) Name of Support/Comfort Primary Source: Brother/Jeff Cambria  Medications Reviewed Today:Unable to review medications today. Patient was in a hurry to end the call. Medications Reviewed Today   Medications were not reviewed in this encounter     Home Care and Equipment/Supplies: Were Home Health Services Ordered?: No Any new equipment or medical supplies ordered?: Yes Name of Medical supply agency?: rolling walker provided at discharge Were you able to get the equipment/medical supplies?: Yes Do you have any questions related to the use of the equipment/supplies?: No  Functional Questionnaire: Do you need assistance with bathing/showering or dressing?: No Do you need assistance with meal preparation?: No Do you need assistance with eating?: No Do you have difficulty maintaining continence: No Do you need assistance with getting out of bed/getting out of a chair/moving?: No Do you have difficulty managing or taking your  medications?: No  Follow up appointments reviewed: PCP Follow-up appointment confirmed?: No (Followed closely by Oncology and Palliative care) Specialist Hospital Follow-up appointment confirmed?: Yes Date of Specialist follow-up appointment?: 03/05/24 Follow-Up Specialty Provider:: Oncology and Palliative Do you need transportation to your follow-up appointment?: No Do you understand care options if your condition(s) worsen?: Yes-patient verbalized understanding  Ms. Harvie was in a hurry to end the call today. RNCM unable to perform complete assessment and medication review.   Arna Better RN, BSN [place]  [facility] RN Care Manager [phone]

## 2024-02-27 ENCOUNTER — Other Ambulatory Visit: Payer: Self-pay

## 2024-02-27 ENCOUNTER — Ambulatory Visit: Payer: MEDICAID

## 2024-02-27 ENCOUNTER — Encounter: Payer: Self-pay | Admitting: Internal Medicine

## 2024-02-27 ENCOUNTER — Other Ambulatory Visit (HOSPITAL_COMMUNITY): Payer: Self-pay

## 2024-02-27 NOTE — Telephone Encounter (Signed)
 Please sent to PCP

## 2024-02-28 ENCOUNTER — Ambulatory Visit: Payer: MEDICAID

## 2024-02-29 ENCOUNTER — Ambulatory Visit: Payer: MEDICAID

## 2024-02-29 ENCOUNTER — Inpatient Hospital Stay: Payer: MEDICAID

## 2024-03-01 ENCOUNTER — Ambulatory Visit: Payer: MEDICAID

## 2024-03-01 ENCOUNTER — Inpatient Hospital Stay: Payer: MEDICAID

## 2024-03-01 ENCOUNTER — Telehealth: Payer: Self-pay

## 2024-03-01 NOTE — Telephone Encounter (Signed)
 RN called Mrs. Broski it was no answer left message to return call.  Missed several appointments per Mrs. Oestreicher due to transportation issues.  Transportation has been arranged and still no show for appointments.

## 2024-03-02 ENCOUNTER — Ambulatory Visit: Payer: MEDICAID

## 2024-03-05 ENCOUNTER — Ambulatory Visit: Payer: MEDICAID

## 2024-03-05 ENCOUNTER — Inpatient Hospital Stay: Payer: MEDICAID | Admitting: Nurse Practitioner

## 2024-03-05 ENCOUNTER — Inpatient Hospital Stay: Payer: MEDICAID

## 2024-03-05 ENCOUNTER — Telehealth: Payer: Self-pay

## 2024-03-05 ENCOUNTER — Inpatient Hospital Stay: Payer: MEDICAID | Admitting: Internal Medicine

## 2024-03-05 NOTE — Telephone Encounter (Signed)
 Attempted to contact patient regarding missed appointments today. Voicemail box is full on the cell phone number, and unable to leave a message on the home number.

## 2024-03-06 ENCOUNTER — Ambulatory Visit: Payer: MEDICAID

## 2024-03-06 ENCOUNTER — Telehealth: Payer: Self-pay | Admitting: Radiation Therapy

## 2024-03-06 ENCOUNTER — Ambulatory Visit: Payer: MEDICAID | Attending: Urology

## 2024-03-06 ENCOUNTER — Other Ambulatory Visit: Payer: Self-pay

## 2024-03-06 DIAGNOSIS — C7951 Secondary malignant neoplasm of bone: Secondary | ICD-10-CM | POA: Insufficient documentation

## 2024-03-06 NOTE — Progress Notes (Signed)
 I reached out to the pt at her mobile number and home number. Unable to LVM at mobile number and home number could not be completed as dialed. I also reached out to the pt's niece, [name]. Her number went straight to VM. I LVM with my direct number and the main CC number [phone] and choose opt 4 to speak to one of Dr Blas desk nurses.

## 2024-03-06 NOTE — Telephone Encounter (Signed)
 Multiple attempts to reach Ms. Eyerman. No answer and unable to leave a message due to her mailbox being full.  I did speak with her niece, [name]. [name] said that she spoke with Yaslyn a couple days ago and that Keylie is telling her she has been coming to the appointments every day.   Devere Perch R.T(R)(T) Radiation Special Procedures Lead

## 2024-03-07 ENCOUNTER — Encounter: Payer: Self-pay | Admitting: Radiation Therapy

## 2024-03-07 ENCOUNTER — Ambulatory Visit: Payer: MEDICAID

## 2024-03-07 ENCOUNTER — Other Ambulatory Visit: Payer: Self-pay

## 2024-03-07 NOTE — Progress Notes (Addendum)
 We have not been able to contact the patient over the phone and she has missed all scheduled appointments for the cancer center since hospital discharge on 02/23/24. A well check call visit from [facility] police department was requested and sent out on Tuesday 7/1. When the officer arrived the patient's brother answered the door and stated that the patient's son had picked her up to take her to an appointment. Unfortunately, Regina [name] did not come to the cancer center for her scheduled treatment appointment. I later talked to her niece who said the son had picked up Regina [name] to take her to get some medication, but that he was not aware she had any appointments to attend.   Her son is not on our list of people that we can contact about her care or talk to about her appointments.   Her niece called Regina [name] again this morning (7/2) about the missed radiation treatments. Regina [name] was short with her niece stating that she was catching a bus to get to her appointment.   Devere Perch R.T(R)(T) Radiation Special Procedures Lead

## 2024-03-08 ENCOUNTER — Encounter (HOSPITAL_COMMUNITY): Payer: Self-pay | Admitting: Emergency Medicine

## 2024-03-08 ENCOUNTER — Other Ambulatory Visit: Payer: Self-pay

## 2024-03-08 ENCOUNTER — Telehealth: Payer: Self-pay | Admitting: *Deleted

## 2024-03-08 ENCOUNTER — Ambulatory Visit
Admission: RE | Admit: 2024-03-08 | Discharge: 2024-03-08 | Disposition: A | Discharge status: Home / Self Care | Payer: MEDICAID | Source: Ambulatory Visit | Attending: Urology | Admitting: Urology

## 2024-03-08 ENCOUNTER — Observation Stay (HOSPITAL_COMMUNITY)
Admission: EM | Admit: 2024-03-08 | Discharge: 2024-03-09 | Disposition: A | Discharge status: Home / Self Care | Payer: MEDICAID | Attending: Internal Medicine | Admitting: Internal Medicine

## 2024-03-08 ENCOUNTER — Emergency Department (HOSPITAL_COMMUNITY): Payer: MEDICAID

## 2024-03-08 ENCOUNTER — Ambulatory Visit: Payer: MEDICAID

## 2024-03-08 ENCOUNTER — Ambulatory Visit
Admission: RE | Admit: 2024-03-08 | Discharge: 2024-03-08 | Disposition: A | Discharge status: Home / Self Care | Payer: MEDICAID | Source: Ambulatory Visit | Attending: Radiation Oncology | Admitting: Radiation Oncology

## 2024-03-08 ENCOUNTER — Other Ambulatory Visit (HOSPITAL_COMMUNITY): Payer: Self-pay

## 2024-03-08 DIAGNOSIS — F12920 Cannabis use, unspecified with intoxication, uncomplicated: Secondary | ICD-10-CM | POA: Diagnosis not present

## 2024-03-08 DIAGNOSIS — J810 Acute pulmonary edema: Principal | ICD-10-CM | POA: Insufficient documentation

## 2024-03-08 DIAGNOSIS — Z1152 Encounter for screening for COVID-19: Secondary | ICD-10-CM | POA: Insufficient documentation

## 2024-03-08 DIAGNOSIS — R600 Localized edema: Secondary | ICD-10-CM | POA: Diagnosis present

## 2024-03-08 DIAGNOSIS — F10920 Alcohol use, unspecified with intoxication, uncomplicated: Secondary | ICD-10-CM | POA: Insufficient documentation

## 2024-03-08 DIAGNOSIS — G8929 Other chronic pain: Secondary | ICD-10-CM | POA: Diagnosis not present

## 2024-03-08 DIAGNOSIS — E876 Hypokalemia: Secondary | ICD-10-CM | POA: Insufficient documentation

## 2024-03-08 DIAGNOSIS — R0602 Shortness of breath: Principal | ICD-10-CM

## 2024-03-08 DIAGNOSIS — I10 Essential (primary) hypertension: Secondary | ICD-10-CM | POA: Insufficient documentation

## 2024-03-08 DIAGNOSIS — J449 Chronic obstructive pulmonary disease, unspecified: Secondary | ICD-10-CM | POA: Insufficient documentation

## 2024-03-08 DIAGNOSIS — K219 Gastro-esophageal reflux disease without esophagitis: Secondary | ICD-10-CM | POA: Diagnosis not present

## 2024-03-08 DIAGNOSIS — C7800 Secondary malignant neoplasm of unspecified lung: Secondary | ICD-10-CM | POA: Insufficient documentation

## 2024-03-08 LAB — BASIC METABOLIC PANEL WITH GFR
Anion gap: 11 (ref 5–15)
BUN: 6 mg/dL (ref 6–20)
CO2: 25 mmol/L (ref 22–32)
Calcium: 9.1 mg/dL (ref 8.9–10.3)
Chloride: 101 mmol/L (ref 98–111)
Creatinine, Ser: 0.58 mg/dL (ref 0.44–1.00)
GFR, Estimated: >60 mL/min (ref >60)
Glucose, Bld: 139 mg/dL — ABNORMAL HIGH (ref 70–99)
Potassium: 3.2 mmol/L — ABNORMAL LOW (ref 3.5–5.1)
Sodium: 137 mmol/L (ref 135–145)

## 2024-03-08 LAB — CBC WITH DIFFERENTIAL/PLATELET
Abs Immature Granulocytes: 0.12 10*3/uL — ABNORMAL HIGH (ref 0.00–0.07)
Basophils Absolute: 0 10*3/uL (ref 0.0–0.1)
Basophils Relative: 1 %
Eosinophils Absolute: 0 10*3/uL (ref 0.0–0.5)
Eosinophils Relative: 0 %
HCT: 40.1 % (ref 36.0–46.0)
Hemoglobin: 13.3 g/dL (ref 12.0–15.0)
Immature Granulocytes: 2 %
Lymphocytes Relative: 21 %
Lymphs Abs: 1.3 10*3/uL (ref 0.7–4.0)
MCH: 28.5 pg (ref 26.0–34.0)
MCHC: 33.2 g/dL (ref 30.0–36.0)
MCV: 85.9 fL (ref 80.0–100.0)
Monocytes Absolute: 0.4 10*3/uL (ref 0.1–1.0)
Monocytes Relative: 6 %
Neutro Abs: 4.5 10*3/uL (ref 1.7–7.7)
Neutrophils Relative %: 70 %
Platelets: 395 10*3/uL (ref 150–400)
RBC: 4.67 MIL/uL (ref 3.87–5.11)
RDW: 18.8 % — ABNORMAL HIGH (ref 11.5–15.5)
WBC: 6.4 10*3/uL (ref 4.0–10.5)
nRBC: 0 % (ref 0.0–0.2)

## 2024-03-08 LAB — RAD ONC ARIA SESSION SUMMARY
Course Elapsed Days: 29
Plan Fractions Treated to Date: 8
Plan Prescribed Dose Per Fraction: 3 Gy
Plan Total Fractions Prescribed: 10
Plan Total Prescribed Dose: 30 Gy
Reference Point Dosage Given to Date: 24 Gy
Reference Point Session Dosage Given: 3 Gy
Session Number: 8

## 2024-03-08 LAB — BRAIN NATRIURETIC PEPTIDE: B Natriuretic Peptide: 75.1 pg/mL (ref 0.0–100.0)

## 2024-03-08 LAB — TROPONIN I (HIGH SENSITIVITY)
Troponin I (High Sensitivity): 15 ng/L (ref <18)
Troponin I (High Sensitivity): 17 ng/L (ref <18)

## 2024-03-08 MED ORDER — ACETAMINOPHEN 650 MG RE SUPP
650.0000 mg | Freq: Four times a day (QID) | RECTAL | Status: DC | PRN
Start: 2024-03-08 — End: 2024-03-09

## 2024-03-08 MED ORDER — ONDANSETRON HCL 4 MG PO TABS: 4.0000 mg | ORAL_TABLET | Freq: Four times a day (QID) | ORAL | Status: DC | PRN

## 2024-03-08 MED ORDER — PANTOPRAZOLE SODIUM 40 MG PO TBEC
40.0000 mg | DELAYED_RELEASE_TABLET | Freq: Every day | ORAL | Status: DC
  Administered 2024-03-08 – 2024-03-09 (×2): 40 mg via ORAL
  Filled 2024-03-08 (×2): qty 1

## 2024-03-08 MED ORDER — FENTANYL CITRATE PF 50 MCG/ML IJ SOSY
50.0000 ug | PREFILLED_SYRINGE | Freq: Once | INTRAMUSCULAR | Status: AC
  Administered 2024-03-08: 50 ug via INTRAVENOUS
  Filled 2024-03-08: qty 1

## 2024-03-08 MED ORDER — OXYCODONE HCL ER 15 MG PO T12A
15.0000 mg | EXTENDED_RELEASE_TABLET | Freq: Three times a day (TID) | ORAL | Status: DC
  Administered 2024-03-08 – 2024-03-09 (×2): 15 mg via ORAL
  Filled 2024-03-08 (×2): qty 1

## 2024-03-08 MED ORDER — DOCUSATE SODIUM 100 MG PO CAPS
100.0000 mg | ORAL_CAPSULE | Freq: Every day | ORAL | Status: DC
  Administered 2024-03-08: 100 mg via ORAL
  Filled 2024-03-08: qty 1

## 2024-03-08 MED ORDER — AMLODIPINE BESYLATE 5 MG PO TABS
5.0000 mg | ORAL_TABLET | Freq: Every day | ORAL | Status: DC
  Administered 2024-03-09: 5 mg via ORAL
  Filled 2024-03-08: qty 1

## 2024-03-08 MED ORDER — BISACODYL 5 MG PO TBEC: 5.0000 mg | DELAYED_RELEASE_TABLET | Freq: Every day | ORAL | Status: DC | PRN

## 2024-03-08 MED ORDER — POTASSIUM CHLORIDE CRYS ER 20 MEQ PO TBCR
40.0000 meq | EXTENDED_RELEASE_TABLET | Freq: Once | ORAL | Status: AC
  Administered 2024-03-08: 40 meq via ORAL
  Filled 2024-03-08: qty 2

## 2024-03-08 MED ORDER — METOPROLOL TARTRATE 25 MG PO TABS
25.0000 mg | ORAL_TABLET | Freq: Two times a day (BID) | ORAL | Status: DC
  Administered 2024-03-08 – 2024-03-09 (×2): 25 mg via ORAL
  Filled 2024-03-08 (×2): qty 1

## 2024-03-08 MED ORDER — LEVETIRACETAM 500 MG PO TABS
500.0000 mg | ORAL_TABLET | Freq: Two times a day (BID) | ORAL | Status: DC
  Administered 2024-03-08 – 2024-03-09 (×2): 500 mg via ORAL
  Filled 2024-03-08 (×2): qty 1

## 2024-03-08 MED ORDER — ALBUTEROL SULFATE (2.5 MG/3ML) 0.083% IN NEBU: 2.5000 mg | INHALATION_SOLUTION | RESPIRATORY_TRACT | Status: DC | PRN

## 2024-03-08 MED ORDER — ENSURE PLUS HIGH PROTEIN PO LIQD
237.0000 mL | Freq: Two times a day (BID) | ORAL | Status: DC
  Administered 2024-03-09: 237 mL via ORAL

## 2024-03-08 MED ORDER — ONDANSETRON HCL 4 MG/2ML IJ SOLN: 4.0000 mg | Freq: Four times a day (QID) | INTRAMUSCULAR | Status: DC | PRN

## 2024-03-08 MED ORDER — ACETAMINOPHEN 325 MG PO TABS
650.0000 mg | ORAL_TABLET | Freq: Four times a day (QID) | ORAL | Status: DC | PRN
  Administered 2024-03-09: 650 mg via ORAL
  Filled 2024-03-08: qty 2

## 2024-03-08 MED ORDER — TRAZODONE HCL 50 MG PO TABS: 25.0000 mg | ORAL_TABLET | Freq: Every evening | ORAL | Status: DC | PRN

## 2024-03-08 MED ORDER — FUROSEMIDE 10 MG/ML IJ SOLN
40.0000 mg | Freq: Once | INTRAMUSCULAR | Status: AC
  Administered 2024-03-08: 40 mg via INTRAVENOUS
  Filled 2024-03-08: qty 4

## 2024-03-08 MED ORDER — POLYETHYLENE GLYCOL 3350 17 G PO PACK: 17.0000 g | PACK | Freq: Every day | ORAL | Status: DC | PRN

## 2024-03-08 MED ORDER — AMLODIPINE BESYLATE 5 MG PO TABS
5.0000 mg | ORAL_TABLET | Freq: Once | ORAL | Status: AC
Start: 2024-03-08 — End: 2024-03-08
  Administered 2024-03-08: 5 mg via ORAL
  Filled 2024-03-08: qty 1

## 2024-03-08 NOTE — Telephone Encounter (Signed)
 PC to patient's niece Raysia, attempted to call patient, no answer, voice mailbox is full - informed Raysia patient has MRI brain scheduled at [facility] on 03/13/24 at 10:50, she is to arrive at 10:20.  She also has F/U appointment with Dr [name] on 03/19/24 at 9:00.  Raysia verbalizes understanding, states she will inform the patient.

## 2024-03-08 NOTE — ED Notes (Signed)
 Patient is gone to the Cancer Center to get her chemo treatment will reassess her pain when she returns to the ED.

## 2024-03-08 NOTE — ED Provider Notes (Signed)
 [place] EMERGENCY DEPARTMENT AT [facility] Provider Note   CSN: [id number] Arrival date & time: 03/08/24  1229     Patient presents with: Leg Swelling and Cancer patient   Regina [name] is a 58 y.o. female patient with metastatic lung cancer to the brain who presents to the emergency department today for further evaluation of increasing lower extremity swelling.  Patient was just discharged in the hospital on 02/23/2024 for similar symptoms.  Per chart review patient had CT angiogram chest which was negative for PE secondary to the lower extremity swelling.  DVT studies were also negative.  Patient complaining of increased level of swelling over the last several days.  She does endorse some associated shortness of breath as well.  Reports associated orthopnea.  Denies chest pain, fever, chills.   HPI     Prior to Admission medications   Medication Sig Start Date End Date Taking? Authorizing Provider  acetaminophen  (TYLENOL ) 500 MG tablet Take 2 tablets (1,000 mg total) by mouth every 8 (eight) hours as needed for mild pain (pain score 1-3) or moderate pain (pain score 4-6). 02/01/24   Jerral Meth, MD  amLODipine  (NORVASC ) 5 MG tablet Take 1 tablet (5 mg total) by mouth daily. 02/13/24   Heilingoetter, Cassandra L, PA-C  bisacodyl  (DULCOLAX) 10 MG suppository Place 1 suppository (10 mg total) rectally as needed for moderate constipation. 02/09/24   Earley Saucer, MD  bisacodyl  (DULCOLAX) 5 MG EC tablet Take 1 tablet (5 mg total) by mouth daily as needed for moderate constipation. 02/09/24   Rizwan, Saima, MD  dexamethasone  (DECADRON ) 2 MG tablet Take 3 tablets (6 mg total) by mouth daily. 02/23/24   Rai, Nydia POUR, MD  diclofenac  Sodium (VOLTAREN ) 1 % GEL Apply 2 g topically 4 (four) times daily. Patient taking differently: Apply 2 g topically as needed. 12/10/23   Zelaya, Oscar A, PA-C  docusate sodium  (COLACE) 100 MG capsule Take 1 capsule (100 mg total) by mouth at bedtime.  02/09/24   Rizwan, Saima, MD  levETIRAcetam  (KEPPRA ) 500 MG tablet Take 1 tablet (500 mg total) by mouth 2 (two) times daily. 02/23/24   Rai, Nydia POUR, MD  metoprolol  tartrate (LOPRESSOR ) 25 MG tablet Take 1 tablet (25 mg total) by mouth 2 (two) times daily. 12/15/23   Ghimire, Donalda HERO, MD  ondansetron  (ZOFRAN ) 4 MG tablet Take 1 tablet (4 mg total) by mouth every 6 (six) hours as needed for nausea or vomiting. 02/23/24   Rai, Nydia POUR, MD  oxyCODONE  (OXYCONTIN ) 15 mg 12 hr tablet Take 1 tablet (15 mg total) by mouth every 8 (eight) hours. 02/23/24   Rai, Ripudeep POUR, MD  pantoprazole  (PROTONIX ) 40 MG tablet Take 1 tablet (40 mg total) by mouth daily. 12/15/23 12/14/24  Ghimire, Donalda HERO, MD  polyethylene glycol (MIRALAX  / GLYCOLAX ) 17 g packet Mix 1 packet (17 g) in 4-8 ounces of water and take by mouth daily. 12/27/23   Cheryle Page, MD  potassium chloride  SA (KLOR-CON  M) 20 MEQ tablet Take 1 tablet (20 mEq total) by mouth 2 (two) times daily. 02/23/24   Rai, Ripudeep K, MD  senna-docusate (SENOKOT-S) 8.6-50 MG tablet Take 2 tablets by mouth at bedtime. 02/09/24   Rizwan, Saima, MD  sodium phosphate  (FLEET) ENEM Place 133 mLs (1 enema total) rectally daily as needed for severe constipation. 02/09/24   Rizwan, Saima, MD  UNABLE TO FIND Left AFO  For foot drop 02/23/24   Rai, Nydia POUR, MD  Allergies: Pork allergy and Bee venom    Review of Systems  All other systems reviewed and are negative.   Updated Vital Signs BP 138/85   Pulse 80   Temp 98.7 F (37.1 C) (Oral)   Resp 17   LMP 06/06/2010   SpO2 98%   Physical Exam Vitals and nursing note reviewed.  Constitutional:      General: She is not in acute distress.    Appearance: Normal appearance.  HENT:     Head: Normocephalic and atraumatic.  Eyes:     General:        Right eye: No discharge.        Left eye: No discharge.  Cardiovascular:     Comments: Regular rate and rhythm.  S1/S2 are distinct without any evidence of  murmur, rubs, or gallops.  Radial pulses are 2+ bilaterally.  Dorsalis pedis pulses are 2+ bilaterally.  2+ pitting edema bilaterally up to the midshin. Pulmonary:     Comments: Clear to auscultation bilaterally.  Normal effort.  No respiratory distress.  No evidence of wheezes, rales, or rhonchi heard throughout. Abdominal:     General: Abdomen is flat. Bowel sounds are normal. There is no distension.     Tenderness: There is no abdominal tenderness. There is no guarding or rebound.  Musculoskeletal:        General: Normal range of motion.     Cervical back: Neck supple.  Skin:    General: Skin is warm and dry.     Findings: No rash.  Neurological:     General: No focal deficit present.     Mental Status: She is alert.  Psychiatric:        Mood and Affect: Mood normal.        Behavior: Behavior normal.     (all labs ordered are listed, but only abnormal results are displayed) Labs Reviewed  CBC WITH DIFFERENTIAL/PLATELET - Abnormal; Notable for the following components:      Result Value   RDW 18.8 (*)    Abs Immature Granulocytes 0.12 (*)    All other components within normal limits  BASIC METABOLIC PANEL WITH GFR - Abnormal; Notable for the following components:   Potassium 3.2 (*)    Glucose, Bld 139 (*)    All other components within normal limits  BRAIN NATRIURETIC PEPTIDE  TROPONIN I (HIGH SENSITIVITY)  TROPONIN I (HIGH SENSITIVITY)    EKG: EKG Interpretation Date/Time:  Thursday March 08 2024 13:47:59 EDT Ventricular Rate:  90 PR Interval:  148 QRS Duration:  78 QT Interval:  415 QTC Calculation: 508 R Axis:   24  Text Interpretation: Sinus rhythm Probable left atrial enlargement RSR' in V1 or V2, probably normal variant Borderline T abnormalities, anterior leads Borderline prolonged QT interval Confirmed by Darra Chew [id number]) on 03/08/2024 1:58:09 PM  Radiology: [facility] Chest 2 View Result Date: 03/08/2024 CLINICAL DATA:  Shortness of breath EXAM: CHEST - 2 VIEW  COMPARISON:  Chest radiograph 02/21/2024, CTA chest 02/22/2024. FINDINGS: Lungs are well inflated. Scarring in the lung apices and lung bases. Coarsened interstitial markings compatible with history of emphysema. Prominence of the central pulmonary vasculature with mild pulmonary edema noted. No pleural effusion or pneumothorax. Cardiomediastinal silhouette is similar to prior. Visualized osseous structures are unremarkable. Limited evaluation of the thoracic spine on lateral image. IMPRESSION: Mild pulmonary edema. Underlying emphysema and pulmonary scarring/fibrosis. Electronically Signed   By: Donnice Mania M.D.   On: 03/08/2024 14:05  Procedures   Medications Ordered in the ED  furosemide  (LASIX ) injection 40 mg (has no administration in time range)  amLODipine  (NORVASC ) tablet 5 mg (5 mg Oral Given 03/08/24 1403)  fentaNYL  (SUBLIMAZE ) injection 50 mcg (50 mcg Intravenous Given 03/08/24 1547)    Clinical Course as of 03/08/24 1720  Thu Mar 08, 2024  1718 CBC with Differential(!) Negative. [CF]  1718 Basic metabolic panel(!) Negative apart from some mild hypokalemia. [CF]  1718 Troponin I (High Sensitivity) Initial delta troponin normal. [CF]  1718 Brain natriuretic peptide Negative. [CF]  1718 DG Chest 2 View Mild pulmonary edema.  I do agree with radiology interpretation. [CF]    Clinical Course User Index [CF] Regina Cameron HERO, PA-C    Medical Decision Making Roselina Burgueno is a 58 y.o. female patient presents the emergency department today for further evaluation of shortness of breath and lower extremity edema.  Certainly could be cardiac causes.  Could also be sequelae of her cancer.  Will look for cardiac causes today.  Low suspicion for pulmonary embolism as the patient did have a negative CTA and DVT studies during her most recent admission.  Vital signs otherwise okay apartment some slight tachycardia on initial vitals.  Patient had some evidence of hypoxia with ambulating  with the pulse ox.  She dropped to 88% and became very short of breath.  Given her lower extremity edema and hypoxic episode while ambulating I do feel the patient would likely benefit from further evaluation in the hospital.  Will give her some Lasix  to try to get some of the fluid off.  I spoke with Dr. Roxane with [facility] who agrees to admit the patient. Patient currently stable for admission.    Amount and/or Complexity of Data Reviewed Labs: ordered. Decision-making details documented in ED Course. Radiology: ordered. Decision-making details documented in ED Course.  Risk Prescription drug management. Decision regarding hospitalization.     Final diagnoses:  Shortness of breath  Peripheral edema    ED Discharge Orders     None          Regina [name], [place] 03/08/24 1720    Regina Lavonia SAILOR, MD 03/10/24 [phone]

## 2024-03-08 NOTE — ED Triage Notes (Signed)
 Pt arriving POV with bilat foot/leg swelling for the past few days. Pt reports she missed her last chemo tx as well. Pt A&O upon arrival.

## 2024-03-08 NOTE — ED Notes (Signed)
 Radiation Oncology is coming to take pt to Radiology at 4 pm then will bring her back.

## 2024-03-08 NOTE — H&P (Signed)
 History and Physical  Nathaniel Yaden FMW:[id number] DOB: [birth year] DOA: 03/08/2024  PCP: Celestia Rosaline SQUIBB, NP   Chief Complaint: Leg swelling  HPI: Regina [name] is a 58 y.o. female with medical history significant for lung emphysema, COPD on room air, hypertension, lung cancer with brain metastasis being admitted to the hospital with lower extremity edema.  She was admitted to the hospital with the same chief complaint on 6/17 but unfortunately was found to have lytic fracture and soft tissue mass concerning for metastasis to the brain during that hospitalization.  Regarding her lower extremity edema, she had bilateral lower extremity Dopplers as well as CTA chest which ruled out DVT/PE.  She returned to the ER for evaluation today as she has continued and worsening bilateral lower extremity edema.  She denies any fevers, chills, shortness of breath, orthopnea, cough.  She has no open sores or wounds on her bilateral lower extremities, and denies any drainage.  States that she has tried elevating her legs, and also compression stockings without improvement.  However when asked if she has done this since her last hospital discharge on 6/19, she says that she has not.  In the emergency department, she was given a dose of IV Lasix and hospitalist admission was requested.  Review of Systems: Please see HPI for pertinent positives and negatives. A complete 10 system review of systems are otherwise negative.  Past Medical History:  Diagnosis Date   Allergy    Anxiety    Brain mass    Cancer (HCC)    COPD (chronic obstructive pulmonary disease) (HCC)    Emphysema of lung (HCC)    Hypertension    Past Surgical History:  Procedure Laterality Date   APPLICATION OF CRANIAL NAVIGATION Left 10/07/2023   Procedure: APPLICATION OF CRANIAL NAVIGATION;  Surgeon: Gillie Duncans, MD;  Location: [facility] OR;  Service: Neurosurgery;  Laterality: Left;   CRANIOTOMY Left 10/07/2023   Procedure: FRONTAL CRANIOTOMY  FOR ABSCESS;  Surgeon: Gillie Duncans, MD;  Location: [facility] OR;  Service: Neurosurgery;  Laterality: Left;   dental procedure     HUMERUS IM NAIL Right 10/10/2023   Procedure: INTRAMEDULLARY (IM) NAIL HUMERAL RIGHT;  Surgeon: Genelle Standing, MD;  Location: [facility] OR;  Service: Orthopedics;  Laterality: Right;   Social History:  reports that she quit smoking about 4 years ago. Her smoking use included cigarettes. She has never used smokeless tobacco. She reports current alcohol  use of about 6.0 standard drinks of alcohol  per week. She reports current drug use. Frequency: 4.00 times per week. Drug: Marijuana.  Allergies  Allergen Reactions   Pork Allergy Nausea And Vomiting   Bee Venom Hives    Family History  Problem Relation Age of Onset   Leukemia Mother    Hypertension Mother    Hypertension Father    Cancer Neg Hx    Diabetes Neg Hx    Heart disease Neg Hx    Colon cancer Neg Hx    Esophageal cancer Neg Hx    Rectal cancer Neg Hx    Stomach cancer Neg Hx      Prior to Admission medications   Medication Sig Start Date End Date Taking? Authorizing Provider  acetaminophen  (TYLENOL ) 500 MG tablet Take 2 tablets (1,000 mg total) by mouth every 8 (eight) hours as needed for mild pain (pain score 1-3) or moderate pain (pain score 4-6). 02/01/24   Jerral Meth, MD  amLODipine  (NORVASC ) 5 MG tablet Take 1 tablet (5 mg total) by mouth daily. 02/13/24  Heilingoetter, Cassandra L, PA-C  bisacodyl  (DULCOLAX) 10 MG suppository Place 1 suppository (10 mg total) rectally as needed for moderate constipation. 02/09/24   Earley Saucer, MD  bisacodyl  (DULCOLAX) 5 MG EC tablet Take 1 tablet (5 mg total) by mouth daily as needed for moderate constipation. 02/09/24   Rizwan, Saima, MD  dexamethasone  (DECADRON ) 2 MG tablet Take 3 tablets (6 mg total) by mouth daily. 02/23/24   Rai, Nydia POUR, MD  diclofenac  Sodium (VOLTAREN ) 1 % GEL Apply 2 g topically 4 (four) times daily. Patient taking differently: Apply 2  g topically as needed. 12/10/23   Zelaya, Oscar A, PA-C  docusate sodium  (COLACE) 100 MG capsule Take 1 capsule (100 mg total) by mouth at bedtime. 02/09/24   Rizwan, Saima, MD  levETIRAcetam  (KEPPRA ) 500 MG tablet Take 1 tablet (500 mg total) by mouth 2 (two) times daily. 02/23/24   Rai, Nydia POUR, MD  metoprolol  tartrate (LOPRESSOR ) 25 MG tablet Take 1 tablet (25 mg total) by mouth 2 (two) times daily. 12/15/23   Ghimire, Donalda HERO, MD  ondansetron  (ZOFRAN ) 4 MG tablet Take 1 tablet (4 mg total) by mouth every 6 (six) hours as needed for nausea or vomiting. 02/23/24   Rai, Nydia POUR, MD  oxyCODONE  (OXYCONTIN ) 15 mg 12 hr tablet Take 1 tablet (15 mg total) by mouth every 8 (eight) hours. 02/23/24   Rai, Nydia POUR, MD  pantoprazole  (PROTONIX ) 40 MG tablet Take 1 tablet (40 mg total) by mouth daily. 12/15/23 12/14/24  Ghimire, Donalda HERO, MD  polyethylene glycol (MIRALAX  / GLYCOLAX ) 17 g packet Mix 1 packet (17 g) in 4-8 ounces of water and take by mouth daily. 12/27/23   Cheryle Page, MD  potassium chloride  SA (KLOR-CON  M) 20 MEQ tablet Take 1 tablet (20 mEq total) by mouth 2 (two) times daily. 02/23/24   Rai, Ripudeep K, MD  senna-docusate (SENOKOT-S) 8.6-50 MG tablet Take 2 tablets by mouth at bedtime. 02/09/24   Rizwan, Saima, MD  sodium phosphate  (FLEET) ENEM Place 133 mLs (1 enema total) rectally daily as needed for severe constipation. 02/09/24   Rizwan, Saima, MD  UNABLE TO FIND Left AFO  For foot drop 02/23/24   Rai, Nydia POUR, MD    Physical Exam: BP 138/85   Pulse 80   Temp 98.7 F (37.1 C) (Oral)   Resp 17   LMP 06/06/2010   SpO2 98%  General:  Alert, oriented, calm, in no acute distress, resting comfortably on room air. Cardiovascular: RRR, no murmurs or rubs, she has 3+ pitting edema in the bilateral lower extremities up to the shin, calf is nontender Respiratory: clear to auscultation bilaterally, no wheezes, minimal bibasilar crackles  Abdomen: soft, nontender, nondistended, normal  bowel tones heard  Skin: dry, no rashes  Musculoskeletal: no joint effusions, normal range of motion  Psychiatric: appropriate affect, normal speech  Neurologic: extraocular muscles intact, clear speech, moving all extremities with intact sensorium         Labs on Admission:  Basic Metabolic Panel: Recent Labs  Lab 03/08/24 1302  NA 137  K 3.2*  CL 101  CO2 25  GLUCOSE 139*  BUN 6  CREATININE 0.58  CALCIUM  9.1   Liver Function Tests: No results for input(s): AST, ALT, ALKPHOS, BILITOT, PROT, ALBUMIN in the last 168 hours. No results for input(s): LIPASE, AMYLASE in the last 168 hours. No results for input(s): AMMONIA in the last 168 hours. CBC: Recent Labs  Lab 03/08/24 1302  WBC 6.4  NEUTROABS 4.5  HGB 13.3  HCT 40.1  MCV 85.9  PLT 395   Cardiac Enzymes: No results for input(s): CKTOTAL, CKMB, CKMBINDEX, TROPONINI in the last 168 hours. BNP (last 3 results) Recent Labs    01/27/24 2242 02/21/24 2006 03/08/24 1302  BNP 103.3* 55.8 75.1    ProBNP (last 3 results) No results for input(s): PROBNP in the last 8760 hours.  CBG: No results for input(s): GLUCAP in the last 168 hours.  Radiological Exams on Admission: DG Chest 2 View Result Date: 03/08/2024 CLINICAL DATA:  Shortness of breath EXAM: CHEST - 2 VIEW COMPARISON:  Chest radiograph 02/21/2024, CTA chest 02/22/2024. FINDINGS: Lungs are well inflated. Scarring in the lung apices and lung bases. Coarsened interstitial markings compatible with history of emphysema. Prominence of the central pulmonary vasculature with mild pulmonary edema noted. No pleural effusion or pneumothorax. Cardiomediastinal silhouette is similar to prior. Visualized osseous structures are unremarkable. Limited evaluation of the thoracic spine on lateral image. IMPRESSION: Mild pulmonary edema. Underlying emphysema and pulmonary scarring/fibrosis. Electronically Signed   By: Donnice Mania M.D.   On: 03/08/2024  14:05   Assessment/Plan Regina [name] is a 58 y.o. female with medical history significant for lung emphysema, COPD on room air, hypertension, lung cancer with brain metastasis being admitted to the hospital with lower extremity edema.   Lower extremity edema-though differential is broad, I feel that DVT or venous insufficiency are quite unlikely.  Most likely this is dependent edema related to her malnutrition. -Observation admission -Monitor for improvement after Lasix -Compression stockings -Elevate legs anytime she is in bed or at rest -Start nutritional supplement twice daily between meals, and consult RD  Hypokalemia-repleted orally, recheck in the morning  Metastatic lung cancer-currently receiving immunotherapy, as well as radiation  Hypertension-amlodipine , Lopressor   GERD-Protonix   Chronic pain of malignancy-continue OxyContin  every 8 hours, with stool regimen  DVT prophylaxis: SCDs only due to hypersensitivity    Code Status: Full Code  Consults called: None  Admission status: Observation  Time spent: 45 minutes  Kenda Kloehn CHRISTELLA Gail MD [facility] Pager [phone]  If 7PM-7AM, please contact night-coverage [email or web address] Password [facility]  03/08/2024, 5:36 PM

## 2024-03-09 ENCOUNTER — Other Ambulatory Visit: Payer: Self-pay

## 2024-03-09 ENCOUNTER — Ambulatory Visit: Payer: MEDICAID

## 2024-03-09 DIAGNOSIS — R0602 Shortness of breath: Principal | ICD-10-CM

## 2024-03-09 DIAGNOSIS — R600 Localized edema: Secondary | ICD-10-CM | POA: Diagnosis not present

## 2024-03-09 LAB — CBC
HCT: 36.6 % (ref 36.0–46.0)
Hemoglobin: 12.3 g/dL (ref 12.0–15.0)
MCH: 28.7 pg (ref 26.0–34.0)
MCHC: 33.6 g/dL (ref 30.0–36.0)
MCV: 85.3 fL (ref 80.0–100.0)
Platelets: 392 K/uL (ref 150–400)
RBC: 4.29 MIL/uL (ref 3.87–5.11)
RDW: 18.6 % — ABNORMAL HIGH (ref 11.5–15.5)
WBC: 7.6 K/uL (ref 4.0–10.5)
nRBC: 0 % (ref 0.0–0.2)

## 2024-03-09 LAB — COMPREHENSIVE METABOLIC PANEL WITH GFR
ALT: 15 U/L (ref 0–44)
AST: 22 U/L (ref 15–41)
Albumin: 3 g/dL — ABNORMAL LOW (ref 3.5–5.0)
Alkaline Phosphatase: 176 U/L — ABNORMAL HIGH (ref 38–126)
Anion gap: 13 (ref 5–15)
BUN: 6 mg/dL (ref 6–20)
CO2: 24 mmol/L (ref 22–32)
Calcium: 8.9 mg/dL (ref 8.9–10.3)
Chloride: 99 mmol/L (ref 98–111)
Creatinine, Ser: 0.59 mg/dL (ref 0.44–1.00)
GFR, Estimated: >60 mL/min (ref >60)
Glucose, Bld: 114 mg/dL — ABNORMAL HIGH (ref 70–99)
Potassium: 3 mmol/L — ABNORMAL LOW (ref 3.5–5.1)
Sodium: 136 mmol/L (ref 135–145)
Total Bilirubin: 0.4 mg/dL (ref 0.0–1.2)
Total Protein: 5.9 g/dL — ABNORMAL LOW (ref 6.5–8.1)

## 2024-03-09 LAB — MAGNESIUM: Magnesium: 1.8 mg/dL (ref 1.7–2.4)

## 2024-03-09 MED ORDER — POTASSIUM CHLORIDE CRYS ER 20 MEQ PO TBCR
40.0000 meq | EXTENDED_RELEASE_TABLET | ORAL | Status: AC
  Administered 2024-03-09 (×2): 40 meq via ORAL
  Filled 2024-03-09 (×2): qty 2

## 2024-03-09 MED ORDER — LOSARTAN POTASSIUM 25 MG PO TABS: 25.0000 mg | ORAL_TABLET | Freq: Every day | ORAL | 1 refills | Status: DC

## 2024-03-09 MED ORDER — FUROSEMIDE 20 MG PO TABS: 20.0000 mg | ORAL_TABLET | Freq: Every day | ORAL | 11 refills | Status: DC | PRN

## 2024-03-09 MED ORDER — FUROSEMIDE 40 MG PO TABS
40.0000 mg | ORAL_TABLET | Freq: Once | ORAL | Status: AC
  Administered 2024-03-09: 40 mg via ORAL
  Filled 2024-03-09: qty 1

## 2024-03-09 MED ORDER — FUROSEMIDE 20 MG PO TABS
20.0000 mg | ORAL_TABLET | Freq: Every day | ORAL | 11 refills | Status: DC | PRN
  Filled 2024-03-09: qty 30, 30d supply, fill #0

## 2024-03-09 MED ORDER — LOSARTAN POTASSIUM 25 MG PO TABS
25.0000 mg | ORAL_TABLET | Freq: Every day | ORAL | 1 refills | Status: DC
  Filled 2024-03-09: qty 30, 30d supply, fill #0

## 2024-03-09 NOTE — Discharge Summary (Signed)
 Physician Discharge Summary   Patient: Regina [name] MRN: [id number] DOB: 1965/12/28  Admit date:     03/08/2024  Discharge date: 03/09/24  Discharge Physician: Amaryllis Dare   PCP: Celestia Rosaline SQUIBB, NP   Recommendations at discharge:  Please obtain CBC and BMP and follow-up We switched amlodipine  with losartan  25 mg daily-please titrate the dose as appropriate Please make sure that patient uses compression stockings and leg elevation while resting. Follow-up with primary care provider  Discharge Diagnoses: Principal Problem:   Bilateral leg edema Active Problems:   Shortness of breath   Hospital Course: Taken from H&P.   Regina [name] is a 58 y.o. female with medical history significant for lung emphysema, COPD on room air, hypertension, lung cancer with brain metastasis being admitted to the hospital with lower extremity edema.  She was admitted to the hospital with the same chief complaint on 6/17 but unfortunately was found to have lytic fracture and soft tissue mass concerning for metastasis to the brain during that hospitalization.  Regarding her lower extremity edema, she had bilateral lower extremity Dopplers as well as CTA chest which ruled out DVT/PE.  She returned to the ER for evaluation today as she has continued and worsening bilateral lower extremity edema.  She received a dose of IV Lasix  in ED.  Labs with mild hypokalemia, albumin 3.1 and elevated alkaline phosphatase.  BNP was normal.  Chest x-ray with concern of mild pulmonary edema with underlying emphysema and pulmonary fibrosis.  7/4: Vital stable with potassium of 3 which is being repleted.  Magnesium  was 1.8.  Significant improvement in lower extremity edema, still having dependent bilateral feet edema.  Patient likely have dependent edema with albumin of 3 and chronic illness.  No shortness of breath today.  She was also on amlodipine  which can contribute to lower extremity edema so it was switched with  losartan .  She need to have a close follow-up with PCP for dose titration.  Patient was also given a prescription of Lasix  20 mg to use as needed for peripheral edema.  She was instructed to keep her legs elevated and use compression stocking while on feet.  She will continue the rest of her home medications with the changes as mentioned above and follow-up with her providers for further assistance.       Consultants: None Procedures performed: None Disposition: Home Diet recommendation:  Discharge Diet Orders (From admission, onward)     Start     Ordered   03/09/24 0000  Diet - low sodium heart healthy        03/09/24 1032           Regular diet DISCHARGE MEDICATION: Allergies as of 03/09/2024       Reactions   Pork Allergy Nausea And Vomiting   Bee Venom Hives        Medication List     STOP taking these medications    amLODipine  5 MG tablet Commonly known as: NORVASC        TAKE these medications    acetaminophen  500 MG tablet Commonly known as: TYLENOL  Take 2 tablets (1,000 mg total) by mouth every 8 (eight) hours as needed for mild pain (pain score 1-3) or moderate pain (pain score 4-6).   bisacodyl  5 MG EC tablet Generic drug: bisacodyl  Take 1 tablet (5 mg total) by mouth daily as needed for moderate constipation.   bisacodyl  10 MG suppository Commonly known as: Dulcolax Place 1 suppository (10 mg total) rectally as needed for  moderate constipation.   dexamethasone  2 MG tablet Commonly known as: DECADRON  Take 3 tablets (6 mg total) by mouth daily.   diclofenac  Sodium 1 % Gel Commonly known as: Voltaren  Apply 2 g topically 4 (four) times daily. What changed:  when to take this reasons to take this   docusate sodium  100 MG capsule Commonly known as: COLACE Take 1 capsule (100 mg total) by mouth at bedtime.   furosemide  20 MG tablet Commonly known as: Lasix  Take 1 tablet (20 mg total) by mouth daily as needed for fluid or edema.    levETIRAcetam  500 MG tablet Commonly known as: KEPPRA  Take 1 tablet (500 mg total) by mouth 2 (two) times daily.   losartan  25 MG tablet Commonly known as: COZAAR  Take 1 tablet (25 mg total) by mouth daily.   metoprolol  tartrate 25 MG tablet Commonly known as: LOPRESSOR  Take 1 tablet (25 mg total) by mouth 2 (two) times daily.   ondansetron  4 MG tablet Commonly known as: ZOFRAN  Take 1 tablet (4 mg total) by mouth every 6 (six) hours as needed for nausea or vomiting.   oxyCODONE  15 mg 12 hr tablet Commonly known as: OXYCONTIN  Take 1 tablet (15 mg total) by mouth every 8 (eight) hours.   pantoprazole  40 MG tablet Commonly known as: Protonix  Take 1 tablet (40 mg total) by mouth daily.   polyethylene glycol 17 g packet Commonly known as: MIRALAX  / GLYCOLAX  Mix 1 packet (17 g) in 4-8 ounces of water and take by mouth daily.   potassium chloride  SA 20 MEQ tablet Commonly known as: KLOR-CON  M Take 1 tablet (20 mEq total) by mouth 2 (two) times daily.   sodium phosphate  Enem Place 133 mLs (1 enema total) rectally daily as needed for severe constipation.   Stool Softener/Laxative 50-8.6 MG tablet Generic drug: senna-docusate Take 2 tablets by mouth at bedtime.   UNABLE TO FIND Left AFO  For foot drop        Follow-up Information     Celestia Rosaline SQUIBB, NP. Schedule an appointment as soon as possible for a visit in 1 week(s).   Specialty: Internal Medicine Contact information: [id number] [place] [address] [phone]                Discharge Exam: There were no vitals filed for this visit. General.  Severely malnourished lady, in no acute distress. Pulmonary.  Lungs clear bilaterally, normal respiratory effort. CV.  Regular rate and rhythm, no JVD, rub or murmur. Abdomen.  Soft, nontender, nondistended, BS positive. CNS.  Alert and oriented .  No focal neurologic deficit. Extremities.  No edema of legs, 2+ on bilateral feet  only. Psychiatry.  Judgment and insight appears normal.   Condition at discharge: stable  The results of significant diagnostics from this hospitalization (including imaging, microbiology, ancillary and laboratory) are listed below for reference.   Imaging Studies: DG Chest 2 View Result Date: 03/08/2024 CLINICAL DATA:  Shortness of breath EXAM: CHEST - 2 VIEW COMPARISON:  Chest radiograph 02/21/2024, CTA chest 02/22/2024. FINDINGS: Lungs are well inflated. Scarring in the lung apices and lung bases. Coarsened interstitial markings compatible with history of emphysema. Prominence of the central pulmonary vasculature with mild pulmonary edema noted. No pleural effusion or pneumothorax. Cardiomediastinal silhouette is similar to prior. Visualized osseous structures are unremarkable. Limited evaluation of the thoracic spine on lateral image. IMPRESSION: Mild pulmonary edema. Underlying emphysema and pulmonary scarring/fibrosis. Electronically Signed   By: Donnice Mania M.D.   On:  03/08/2024 14:05   VAS US  LOWER EXTREMITY VENOUS (DVT) (ONLY [facility] & [facility]) Result Date: 02/22/2024  Lower Venous DVT Study Patient Name:  JAILA SCHELLHORN  Date of Exam:   02/22/2024 Medical Rec #: [id number]       Accession #:    [id number] Date of Birth: [birth year]       Patient Gender: F Patient Age:   73 years Exam Location:  [facility] Procedure:      VAS US  LOWER EXTREMITY VENOUS (DVT) Referring Phys: LAMAR DESS --------------------------------------------------------------------------------  Indications: Swelling.  Risk Factors: Metastatic lung cancer on chemotherapy. Comparison Study: No previous exams Performing Technologist: Jody Hill RVT, RDMS  Examination Guidelines: A complete evaluation includes B-mode imaging, spectral Doppler, color Doppler, and power Doppler as needed of all accessible portions of each vessel. Bilateral testing is considered an integral part of a complete examination. Limited examinations for  reoccurring indications may be performed as noted. The reflux portion of the exam is performed with the patient in reverse Trendelenburg.  +-----+---------------+---------+-----------+----------+--------------+ RIGHTCompressibilityPhasicitySpontaneityPropertiesThrombus Aging +-----+---------------+---------+-----------+----------+--------------+ CFV  Full           Yes      Yes                                 +-----+---------------+---------+-----------+----------+--------------+   +---------+---------------+---------+-----------+----------+--------------+ LEFT     CompressibilityPhasicitySpontaneityPropertiesThrombus Aging +---------+---------------+---------+-----------+----------+--------------+ CFV      Full           Yes      Yes                                 +---------+---------------+---------+-----------+----------+--------------+ SFJ      Full                                                        +---------+---------------+---------+-----------+----------+--------------+ FV Prox  Full           Yes      Yes                                 +---------+---------------+---------+-----------+----------+--------------+ FV Mid   Full           Yes      Yes                                 +---------+---------------+---------+-----------+----------+--------------+ FV DistalFull           Yes      Yes                                 +---------+---------------+---------+-----------+----------+--------------+ PFV      Full                                                        +---------+---------------+---------+-----------+----------+--------------+ POP      Full  Yes      Yes                                 +---------+---------------+---------+-----------+----------+--------------+ PTV      Full                                                        +---------+---------------+---------+-----------+----------+--------------+  PERO     Full                                                        +---------+---------------+---------+-----------+----------+--------------+     Summary: RIGHT: - No evidence of common femoral vein obstruction.  LEFT: - There is no evidence of deep vein thrombosis in the lower extremity.  - No cystic structure found in the popliteal fossa. Subcutaneous edema of calf and ankle.  *See table(s) above for measurements and observations. Electronically signed by Fonda Rim on 02/22/2024 at 6:58:16 PM.    Final    CT Angio Chest Pulmonary Embolism (PE) W or WO Contrast Result Date: 02/22/2024 CLINICAL DATA:  Shortness of breath. Leg swelling. Metastatic lung cancer. Clinical concern for pulmonary embolus. EXAM: CT ANGIOGRAPHY CHEST WITH CONTRAST TECHNIQUE: Multidetector CT imaging of the chest was performed using the standard protocol during bolus administration of intravenous contrast. Multiplanar CT image reconstructions and MIPs were obtained to evaluate the vascular anatomy. RADIATION DOSE REDUCTION: This exam was performed according to the departmental dose-optimization program which includes automated exposure control, adjustment of the mA and/or kV according to patient size and/or use of iterative reconstruction technique. CONTRAST:  75mL OMNIPAQUE  IOHEXOL  350 MG/ML SOLN COMPARISON:  01/28/2024 FINDINGS: Cardiovascular: The heart is enlarged. No substantial pericardial effusion. Mild atherosclerotic calcification is noted in the wall of the thoracic aorta. There is no filling defect within the opacified pulmonary arteries to suggest the presence of an acute pulmonary embolus. Mediastinum/Nodes: Upper normal mediastinal lymph nodes are similar to prior. 12 mm short axis right hilar node on 37/4 was 8 mm previously. 13 mm short axis left hilar node on 40/4 was 10 mm previously. 19 mm short axis right inferior hilar node on 50/4 was 12 mm short axis previously. The esophagus has normal imaging features.  Right axillary and subpectoral lymphadenopathy again noted. Index right-sided node measured previously at 13 mm is 13 mm again today on image 21/4. Lungs/Pleura: Advanced changes of centrilobular and paraseptal emphysema noted with diffuse bilateral subpleural reticulation basilar predominant subpleural honeycombing, features consistent with fibrosis. No substantial pleural effusion. Right apical nodule measured previously at 2.3 x 1.6 cm is now 2.5 x 2.4 cm on image 28/6. no new focal dense consolidative opacity. Basilar interstitial markings and ground-glass opacity in the lower lobes is progressive in the interval, likely technical as appearance is similar to that of a study from 12/12/2023. Upper Abdomen: Visualized portion of the upper abdomen shows no acute findings. Musculoskeletal: No worrisome lytic or sclerotic osseous abnormality. Status post ORIF right humerus. Review of the MIP images confirms the above findings. IMPRESSION: 1. No CT evidence for acute pulmonary embolus. 2. Interval increase in size of the  right apical nodule. 3. Hilar and axillary lymphadenopathy is mildly progressive in the interval. 4. Stable right axillary and subpectoral lymphadenopathy. 5. Emphysema superimposed on chronic interstitial lung disease/pulmonary fibrosis. 6. Aortic Atherosclerosis (ICD10-I70.0) and Emphysema (ICD10-J43.9). Electronically Signed   By: Camellia Candle M.D.   On: 02/22/2024 11:53   MR Brain W and Wo Contrast Result Date: 02/22/2024 CLINICAL DATA:  58 year old female with altered mental status. Metastatic non-small cell lung cancer, treated brain metastasis. EXAM: MRI HEAD WITHOUT AND WITH CONTRAST TECHNIQUE: Multiplanar, multiecho pulse sequences of the brain and surrounding structures were obtained without and with intravenous contrast. CONTRAST:  5mL GADAVIST  GADOBUTROL  1 MMOL/ML IV SOLN COMPARISON:  Head CT yesterday.  Restaging MRI 12/14/2023. FINDINGS: Brain: Confluent T2 and FLAIR hyperintensity  in the left hemisphere persists. Mass effect on the right lateral ventricle has regressed since April. Mild rightward midline shift has not significantly changed (series 13, image 30). Underlying left anterior frontal operculum epicenter enhancing mass there is larger, now up to 33 x 25 x 22 mm (AP by transverse by CC), 2 cm long axis in April. The mass is mostly Iso intense on T2. And is partially diffusion restricted. Hemosiderin is stable to regressed. No other abnormal intra-axial brain enhancement. And no abnormal dural thickening or enhancement despite progressed skull disease detailed below. No superimposed diffusion restriction suggestive of acute infarction. No increased mass affect, ventriculomegaly, extra-axial collection or acute intracranial hemorrhage. Cervicomedullary junction and pituitary are within normal limits. No new blood products. Patchy and widely scattered additional chronic white matter T2 and FLAIR hyperintensity is stable. Vascular: Major intracranial vascular flow voids are stable. Following contrast the major dural venous sinuses are enhancing and appear to be patent. Skull and upper cervical spine: Substantially new/progressed since April lytic and expansile right superior skull metastasis, enhancing and 28 by 26 x 14 mm. Extension superficially into the scalp more so than intracranially (series 19, image 19). Smaller nearby enhancing right lateral skull metastasis series 20, image 120.-skull base and visible cervical vertebral marrow signal remain normal. Negative visible cervical spinal cord. Sinuses/Orbits: Stable, negative. Other: Stable mild right mastoid effusion. Visible internal auditory structures appear normal. IMPRESSION: 1. Treated left hemisphere brain metastasis is Larger since 12/14/2023, now up to 33 mm long axis (previously 20 mm), although confluent surrounding T2/FLAIR hyperintensity has not significantly changed, and mild regional mass effect has regressed. This is  indeterminate for pseudoprogression versus true tumor progression. No new parenchymal brain metastasis. 2. Progressed right lateral convexity skull metastasis, large in expansile up to 2.8 cm long axis. Extension into the scalp more so than intracranially. No pachymeningeal thickening or underlying cerebral edema at this time. Electronically Signed   By: VEAR Hurst M.D.   On: 02/22/2024 11:06   CT HEAD WO CONTRAST Result Date: 02/21/2024 CLINICAL DATA:  Mental status change, unknown cause. Edema of the lower leg and feet for the past few days. Hx of Brain Cancer. Family reports pt had brain surgery a month ago, since then pt baseline has been difficulty with communication and agitation due to the same difficulties EXAM: CT HEAD WITHOUT CONTRAST TECHNIQUE: Contiguous axial images were obtained from the base of the skull through the vertex without intravenous contrast. RADIATION DOSE REDUCTION: This exam was performed according to the departmental dose-optimization program which includes automated exposure control, adjustment of the mA and/or kV according to patient size and/or use of iterative reconstruction technique. COMPARISON:  MRI head 12/14/2023 FINDINGS: Brain: Redemonstration of left frontotemporal encephalomalacia with known underlying  mass better evaluated on MRI 12/14/2023 (2:18). Patchy and confluent areas of decreased attenuation are noted throughout the deep and periventricular white matter of the cerebral hemispheres bilaterally, compatible with chronic microvascular ischemic disease. No evidence of large-territorial acute infarction. No parenchymal hemorrhage. No new parenchymal mass lesion. No extra-axial collection. No mass effect or midline shift. No hydrocephalus. Basilar cisterns are patent. Vascular: No hyperdense vessel. Skull: Interval development of lytic calvarial fracture with question underlying soft tissue mass (2:25). Prior left frontal craniotomy. Sinuses/Orbits: Paranasal sinuses and  mastoid air cells are clear. The orbits are unremarkable. Other: None. IMPRESSION: 1. Interval development of lytic calvarial fracture with question underlying soft tissue mass. Finding concerning for metastasis. Recommend MRI with and without contrast for further evaluation. 2. Redemonstration of left frontotemporal encephalomalacia with known underlying mass better evaluated on MRI 12/14/2023. Electronically Signed   By: Morgane  Naveau M.D.   On: 02/21/2024 19:50   DG Chest Port 1 View Result Date: 02/21/2024 CLINICAL DATA:  weakness EXAM: PORTABLE CHEST 1 VIEW COMPARISON:  Chest x-ray 02/06/2024 FINDINGS: The heart and mediastinal contours are within normal limits. Redemonstration of chronic coarsened interstitial markings at the lung bases with likely superimposed mild pulmonary edema. Biapical pleural/pulmonary scarring. No focal consolidation. No pleural effusion. No pneumothorax. No acute osseous abnormality. IMPRESSION: Mild pulmonary edema.  Underlying pulmonary fibrosis. Electronically Signed   By: Morgane  Naveau M.D.   On: 02/21/2024 19:25    Microbiology: Results for orders placed or performed during the hospital encounter of 12/12/23  Resp panel by RT-PCR (RSV, Flu A&B, Covid) Urine, Catheterized     Status: Abnormal   Collection Time: 12/12/23  5:52 PM   Specimen: Urine, Catheterized; Nasal Swab  Result Value Ref Range Status   SARS Coronavirus 2 by RT PCR POSITIVE (A) NEGATIVE Final   Influenza A by PCR NEGATIVE NEGATIVE Final   Influenza B by PCR NEGATIVE NEGATIVE Final    Comment: (NOTE) The Xpert Xpress SARS-CoV-2/FLU/RSV plus assay is intended as an aid in the diagnosis of influenza from Nasopharyngeal swab specimens and should not be used as a sole basis for treatment. Nasal washings and aspirates are unacceptable for Xpert Xpress SARS-CoV-2/FLU/RSV testing.  Fact Sheet for Patients: BloggerCourse.com  Fact Sheet for Healthcare  Providers: SeriousBroker.it  This test is not yet approved or cleared by the [place]  FDA and has been authorized for detection and/or diagnosis of SARS-CoV-2 by FDA under an Emergency Use Authorization (EUA). This EUA will remain in effect (meaning this test can be used) for the duration of the COVID-19 declaration under Section 564(b)(1) of the Act, 21 U.S.C. section 360bbb-3(b)(1), unless the authorization is terminated or revoked.     Resp Syncytial Virus by PCR NEGATIVE NEGATIVE Final    Comment: (NOTE) Fact Sheet for Patients: BloggerCourse.com  Fact Sheet for Healthcare Providers: SeriousBroker.it  This test is not yet approved or cleared by the [place]  FDA and has been authorized for detection and/or diagnosis of SARS-CoV-2 by FDA under an Emergency Use Authorization (EUA). This EUA will remain in effect (meaning this test can be used) for the duration of the COVID-19 declaration under Section 564(b)(1) of the Act, 21 U.S.C. section 360bbb-3(b)(1), unless the authorization is terminated or revoked.  Performed at [facility], [address], [place], [place] [address]   Culture, blood (single) w Reflex to ID Panel     Status: None   Collection Time: 12/13/23  8:00 AM   Specimen: BLOOD LEFT HAND  Result  Value Ref Range Status   Specimen Description BLOOD LEFT HAND  Final   Special Requests   Final    BOTTLES DRAWN AEROBIC AND ANAEROBIC Blood Culture results may not be optimal due to an inadequate volume of blood received in culture bottles   Culture   Final    NO GROWTH 5 DAYS Performed at [facility], [address] [place], [place] [address]    Report Status 12/18/2023 FINAL  Final    Labs: CBC: Recent Labs  Lab 03/08/24 1302 03/09/24 0326  WBC 6.4 7.6  NEUTROABS 4.5  --   HGB 13.3 12.3  HCT 40.1 36.6  MCV 85.9 85.3  PLT 395 392   Basic Metabolic  Panel: Recent Labs  Lab 03/08/24 1302 03/09/24 0322 03/09/24 0326  NA 137  --  136  K 3.2*  --  3.0*  CL 101  --  99  CO2 25  --  24  GLUCOSE 139*  --  114*  BUN 6  --  6  CREATININE 0.58  --  0.59  CALCIUM  9.1  --  8.9  MG  --  1.8  --    Liver Function Tests: Recent Labs  Lab 03/09/24 0326  AST 22  ALT 15  ALKPHOS 176*  BILITOT 0.4  PROT 5.9*  ALBUMIN 3.0*   CBG: No results for input(s): GLUCAP in the last 168 hours.  Discharge time spent: greater than 30 minutes.  This record has been created using Conservation officer, historic buildings. Errors have been sought and corrected,but may not always be located. Such creation errors do not reflect on the standard of care.   Signed: Amaryllis Dare, MD [facility] 03/09/2024

## 2024-03-09 NOTE — Progress Notes (Signed)
 Initial Nutrition Assessment  DOCUMENTATION CODES:   Not applicable  INTERVENTION:  - Diet per MD.  - Ensure Plus High Protein po BID, each supplement provides 350 kcal and 20 grams of protein.  Patient now with discharge orders in.   NUTRITION DIAGNOSIS:   Increased nutrient needs related to chronic illness (lung cancer with brains mets; COPD) as evidenced by estimated needs.  GOAL:   Patient will meet greater than or equal to 90% of their needs  MONITOR:   PO intake, Supplement acceptance, Weight trends  REASON FOR ASSESSMENT:   Consult Assessment of nutrition requirement/status  ASSESSMENT:   58 y.o. female with PMH significant for lung emphysema, COPD on room air, HTN, lung cancer with brain metastasis admitted for lower extremity edema.  RD working remotely. Called patient via bedside telephone to obtain nutrition history but unable to reach patient.  Per chart review, patient's weight has fluctuated between 105-123# over the past year. There was no weight take this admission and last weight is from 6/9, so unable to determine weight trends over the past month. Given patient admitted for lower extremity edema, suspect current weight is likely elevated due to fluid.  Patient documented to have consumed 50% of her dinner yesterday. Will add Ensure. However, patient noted to be discharging today.   Medications reviewed and include: Colace, Protonix   Labs reviewed:  K+ 3.0   NUTRITION - FOCUSED PHYSICAL EXAM:  RD working remotely  Diet Order:   Diet Order             Diet - low sodium heart healthy           Diet regular Room service appropriate? Yes; Fluid consistency: Thin  Diet effective now                   EDUCATION NEEDS:  No education needs have been identified at this time  Skin:  Skin Assessment: Reviewed RN Assessment  Last BM:  unknown  Height:  Ht Readings from Last 1 Encounters:  03/08/24 5' 6 (1.676 m)   Weight:  Wt  Readings from Last 1 Encounters:  02/13/24 55.5 kg   Ideal Body Weight:  59.09 kg  BMI:  Body mass index is 19.74 kg/m.  Estimated Nutritional Needs:  Kcal:  1650-1950 kcals Protein:  85-95 grams Fluid:  >/= 1.7L    Trude Ned RD, LDN Contact via Secure Chat.

## 2024-03-09 NOTE — Plan of Care (Signed)

## 2024-03-09 NOTE — Progress Notes (Signed)
 IV removed, dressed, belongings packed, instructions reviewed with understanding verbalized, niece called to come pick up.

## 2024-03-09 NOTE — TOC Initial Note (Signed)
 Transition of Care [facility]) - Initial/Assessment Note    Patient Details  Name: Regina [name] MRN: [id number] Date of Birth: [birth year]  Transition of Care [facility]) CM/SW Contact:    Sonda Manuella Quill, RN Phone Number: 03/09/2024, 11:58 AM  Clinical Narrative:                 Beatris w/ pt in room; pt says she shares a home w/ her brother; she plans to return at d/c; pt identified POC [name] Rollene (friend) [phone]; pt says she her friend  will provide transportation; pt says she had difficulty paying for housing and utilities; she denied other SDOH risks; she has a walker; pt says she does not have HH services or home oxygen; she agreed to receive resources for social services, and financial assistance; pt informed resources placed in d/c instructions; she will make her own appt at agencies of choice; no TOC needs.  Expected Discharge Plan: Home/Self Care Barriers to Discharge: No Barriers Identified   Patient Goals and CMS Choice Patient states their goals for this hospitalization and ongoing recovery are:: home          Expected Discharge Plan and Services   Discharge Planning Services: CM Consult   Living arrangements for the past 2 months: Apartment Expected Discharge Date: 03/09/24               DME Arranged: N/A DME Agency: NA       HH Arranged: NA HH Agency: NA        Prior Living Arrangements/Services Living arrangements for the past 2 months: Apartment Lives with:: Relatives Patient language and need for interpreter reviewed:: Yes Do you feel safe going back to the place where you live?: Yes      Need for Family Participation in Patient Care: Yes (Comment) Care giver support system in place?: Yes (comment) Current home services: DME (walker) Criminal Activity/Legal Involvement Pertinent to Current Situation/Hospitalization: No - Comment as needed  Activities of Daily Living   ADL Screening (condition at time of admission) Independently performs  ADLs?: Yes (appropriate for developmental age) Is the patient deaf or have difficulty hearing?: No Does the patient have difficulty seeing, even when wearing glasses/contacts?: No Does the patient have difficulty concentrating, remembering, or making decisions?: Yes  Permission Sought/Granted Permission sought to share information with : Case Manager Permission granted to share information with : Yes, Verbal Permission Granted  Share Information with NAME: Case Manager     Permission granted to share info w Relationship: Walker Rollene (friend) [phone]     Emotional Assessment Appearance:: Appears stated age Attitude/Demeanor/Rapport: Gracious Affect (typically observed): Accepting Orientation: : Oriented to Self, Oriented to Place, Oriented to  Time, Oriented to Situation Alcohol  / Substance Use: Not Applicable Psych Involvement: No (comment)  Admission diagnosis:  Shortness of breath [R06.02] Peripheral edema [R60.0] Bilateral leg edema [R60.0] Patient Active Problem List   Diagnosis Date Noted   Shortness of breath 03/09/2024   Bilateral leg edema 03/08/2024   Aphasia 02/22/2024   Hypertension 02/13/2024   Encounter for antineoplastic immunotherapy 02/13/2024   Left leg weakness 02/12/2024   Dysarthria 02/12/2024   Arm pain, diffuse, right- due to metastatic lung cancer 02/06/2024   Cancer-related breakthrough pain 12/19/2023   High risk medication use 12/19/2023   Non-small cell lung cancer (HCC) 12/19/2023   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   Medication management 12/19/2023   Cancer associated pain 12/19/2023   Acute metabolic encephalopathy 12/13/2023   Expressive aphasia 12/13/2023  SIRS (systemic inflammatory response syndrome) (HCC) 12/13/2023   Lactic acidosis 12/13/2023   History of COVID-19 12/13/2023   Metabolic acidosis with increased anion gap and accumulation of organic acids 12/13/2023   Pathologic fracture 11/22/2023   Hypokalemia  11/22/2023   Closed fracture of shaft of right humerus 10/10/2023   Pressure injury of skin 10/10/2023   Cerebral intracranial abscess 10/07/2023   Palliative care encounter 10/04/2023   Pathological fracture of right humerus with delayed healing 10/04/2023   Lung cancer metastatic to brain (HCC) 10/04/2023   Vasogenic brain edema (HCC) 10/04/2023   Counseling and coordination of care 10/04/2023   Protein-calorie malnutrition, severe 10/04/2023   Metastasis to bone (HCC) 10/03/2023   Cerebral edema (HCC) 10/01/2023   Primary adenocarcinoma of upper lobe of right lung (HCC) 09/22/2023   Pathological fracture in neoplastic disease, right humerus, initial encounter for fracture 09/19/2023   Neoplasm causing mass effect and brain compression on adjacent structures (HCC) 09/18/2023   COPD (chronic obstructive pulmonary disease) (HCC)    Anxiety    Hyponatremia 04/16/2018   Depression 04/16/2018   Hypertensive urgency    HTN (hypertension) 01/13/2016   Right anterior shoulder pain 07/18/2014   Postconcussion syndrome 05/28/2014   PCP:  Celestia Rosaline SQUIBB, NP Pharmacy:   [facility] [address] [place] [address] Phone: [phone] Fax: [phone]  DARRYLE LONG - [facility] [address] [place] [address] Phone: [phone] Fax: [phone]     Social Drivers of Health (SDOH) Social History: SDOH Screenings   Food Insecurity: No Food Insecurity (03/09/2024)  Recent Concern: Food Insecurity - Food Insecurity Present (01/16/2024)  Housing: High Risk (03/09/2024)  Transportation Needs: No Transportation Needs (03/09/2024)  Utilities: At Risk (03/09/2024)  Depression (PHQ2-9): Low Risk  (01/16/2024)  Tobacco Use: Medium Risk (03/08/2024)   SDOH Interventions: Food Insecurity Interventions: Intervention Not Indicated, Inpatient TOC Housing Interventions: Community Resources Provided,  Inpatient TOC Transportation Interventions: Intervention Not Indicated, Inpatient TOC Utilities Interventions: Community Resources Provided, Inpatient TOC   Readmission Risk Interventions    02/07/2024    3:53 PM 11/24/2023    4:23 PM 10/05/2023    9:51 AM  Readmission Risk Prevention Plan  Transportation Screening Complete Complete Complete  HRI or Home Care Consult   Complete  Social Work Consult for Recovery Care Planning/Counseling   Complete  Palliative Care Screening   Complete  Medication Review Oceanographer) Referral to Pharmacy Complete Complete  PCP or Specialist appointment within 3-5 days of discharge Complete Complete   HRI or Home Care Consult Complete Complete   SW Recovery Care/Counseling Consult Complete Complete   Palliative Care Screening Complete Complete   Skilled Nursing Facility Not Applicable Not Applicable

## 2024-03-09 NOTE — Hospital Course (Addendum)
 Taken from H&P.   Regina [name] is a 58 y.o. female with medical history significant for lung emphysema, COPD on room air, hypertension, lung cancer with brain metastasis being admitted to the hospital with lower extremity edema.  She was admitted to the hospital with the same chief complaint on 6/17 but unfortunately was found to have lytic fracture and soft tissue mass concerning for metastasis to the brain during that hospitalization.  Regarding her lower extremity edema, she had bilateral lower extremity Dopplers as well as CTA chest which ruled out DVT/PE.  She returned to the ER for evaluation today as she has continued and worsening bilateral lower extremity edema.  She received a dose of IV Lasix  in ED.  Labs with mild hypokalemia, albumin 3.1 and elevated alkaline phosphatase.  BNP was normal.  Chest x-ray with concern of mild pulmonary edema with underlying emphysema and pulmonary fibrosis.  7/4: Vital stable with potassium of 3 which is being repleted.  Magnesium  was 1.8.  Significant improvement in lower extremity edema, still having dependent bilateral feet edema.  Patient likely have dependent edema with albumin of 3 and chronic illness.  No shortness of breath today.  She was also on amlodipine  which can contribute to lower extremity edema so it was switched with losartan .  She need to have a close follow-up with PCP for dose titration.  Patient was also given a prescription of Lasix  20 mg to use as needed for peripheral edema.  She was instructed to keep her legs elevated and use compression stocking while on feet.  She will continue the rest of her home medications with the changes as mentioned above and follow-up with her providers for further assistance.

## 2024-03-12 ENCOUNTER — Ambulatory Visit
Admission: RE | Admit: 2024-03-12 | Discharge: 2024-03-12 | Disposition: A | Discharge status: Home / Self Care | Payer: MEDICAID | Source: Ambulatory Visit | Attending: Radiation Oncology | Admitting: Radiation Oncology

## 2024-03-12 ENCOUNTER — Encounter (HOSPITAL_COMMUNITY): Payer: Self-pay

## 2024-03-12 ENCOUNTER — Emergency Department (HOSPITAL_COMMUNITY)
Admission: EM | Admit: 2024-03-12 | Discharge: 2024-03-12 | Disposition: A | Discharge status: Home / Self Care | Payer: MEDICAID | Attending: Emergency Medicine | Admitting: Emergency Medicine

## 2024-03-12 ENCOUNTER — Other Ambulatory Visit: Payer: Self-pay

## 2024-03-12 ENCOUNTER — Emergency Department (HOSPITAL_COMMUNITY): Payer: MEDICAID

## 2024-03-12 ENCOUNTER — Other Ambulatory Visit: Payer: Self-pay | Admitting: Nurse Practitioner

## 2024-03-12 DIAGNOSIS — I10 Essential (primary) hypertension: Secondary | ICD-10-CM | POA: Diagnosis not present

## 2024-03-12 DIAGNOSIS — J449 Chronic obstructive pulmonary disease, unspecified: Secondary | ICD-10-CM | POA: Insufficient documentation

## 2024-03-12 DIAGNOSIS — C7951 Secondary malignant neoplasm of bone: Secondary | ICD-10-CM | POA: Diagnosis present

## 2024-03-12 DIAGNOSIS — C7931 Secondary malignant neoplasm of brain: Secondary | ICD-10-CM

## 2024-03-12 DIAGNOSIS — Z79899 Other long term (current) drug therapy: Secondary | ICD-10-CM | POA: Insufficient documentation

## 2024-03-12 DIAGNOSIS — Z515 Encounter for palliative care: Secondary | ICD-10-CM

## 2024-03-12 DIAGNOSIS — G893 Neoplasm related pain (acute) (chronic): Secondary | ICD-10-CM

## 2024-03-12 DIAGNOSIS — M79601 Pain in right arm: Secondary | ICD-10-CM | POA: Insufficient documentation

## 2024-03-12 DIAGNOSIS — C3490 Malignant neoplasm of unspecified part of unspecified bronchus or lung: Secondary | ICD-10-CM | POA: Insufficient documentation

## 2024-03-12 DIAGNOSIS — C3411 Malignant neoplasm of upper lobe, right bronchus or lung: Secondary | ICD-10-CM

## 2024-03-12 LAB — COMPREHENSIVE METABOLIC PANEL WITH GFR
ALT: 11 U/L (ref 0–44)
AST: 25 U/L (ref 15–41)
Albumin: 3.2 g/dL — ABNORMAL LOW (ref 3.5–5.0)
Alkaline Phosphatase: 175 U/L — ABNORMAL HIGH (ref 38–126)
Anion gap: 17 — ABNORMAL HIGH (ref 5–15)
BUN: 6 mg/dL (ref 6–20)
CO2: 21 mmol/L — ABNORMAL LOW (ref 22–32)
Calcium: 8.9 mg/dL (ref 8.9–10.3)
Chloride: 100 mmol/L (ref 98–111)
Creatinine, Ser: 0.66 mg/dL (ref 0.44–1.00)
GFR, Estimated: >60 mL/min (ref >60)
Glucose, Bld: 74 mg/dL (ref 70–99)
Potassium: 3.5 mmol/L (ref 3.5–5.1)
Sodium: 138 mmol/L (ref 135–145)
Total Bilirubin: 0.8 mg/dL (ref 0.0–1.2)
Total Protein: 6.1 g/dL — ABNORMAL LOW (ref 6.5–8.1)

## 2024-03-12 LAB — RAD ONC ARIA SESSION SUMMARY
Course Elapsed Days: 33
Plan Fractions Treated to Date: 9
Plan Prescribed Dose Per Fraction: 3 Gy
Plan Total Fractions Prescribed: 10
Plan Total Prescribed Dose: 30 Gy
Reference Point Dosage Given to Date: 27 Gy
Reference Point Session Dosage Given: 3 Gy
Session Number: 9

## 2024-03-12 LAB — CBC
HCT: 46.3 % — ABNORMAL HIGH (ref 36.0–46.0)
Hemoglobin: 14.6 g/dL (ref 12.0–15.0)
MCH: 28.3 pg (ref 26.0–34.0)
MCHC: 31.5 g/dL (ref 30.0–36.0)
MCV: 89.7 fL (ref 80.0–100.0)
Platelets: 390 K/uL (ref 150–400)
RBC: 5.16 MIL/uL — ABNORMAL HIGH (ref 3.87–5.11)
RDW: 19.5 % — ABNORMAL HIGH (ref 11.5–15.5)
WBC: 7 K/uL (ref 4.0–10.5)
nRBC: 0 % (ref 0.0–0.2)

## 2024-03-12 MED ORDER — SODIUM BICARBONATE 8.4 % IV SOLN: 50.0000 meq | Freq: Once | INTRAVENOUS | Status: DC

## 2024-03-12 MED ORDER — OXYCODONE HCL ER 15 MG PO T12A: 15.0000 mg | EXTENDED_RELEASE_TABLET | Freq: Three times a day (TID) | ORAL | 0 refills | Status: DC

## 2024-03-12 MED ORDER — OXYCODONE-ACETAMINOPHEN 5-325 MG PO TABS
1.0000 | ORAL_TABLET | Freq: Once | ORAL | Status: DC
  Filled 2024-03-12: qty 1

## 2024-03-12 MED ORDER — OXYCODONE HCL 5 MG PO TABS
10.0000 mg | ORAL_TABLET | ORAL | Status: AC
  Administered 2024-03-12: 10 mg via ORAL
  Filled 2024-03-12: qty 2

## 2024-03-12 MED ORDER — OXYCODONE HCL 10 MG PO TABS: 5.0000 mg | ORAL_TABLET | ORAL | 0 refills | Status: DC | PRN

## 2024-03-12 NOTE — ED Provider Notes (Signed)
 [place] EMERGENCY DEPARTMENT AT [facility] Provider Note   CSN: [id number] Arrival date & time: 03/12/24  9495     Patient presents with: Arm Pain   Regina [name] is a 58 y.o. female.   HPI 58 year old female history of COPD, hypertension, lung cancer with brain mets, recently admitted for lower extremity edema.  Patient reports that she was to have radiation therapy on her right arm today but was brought to the wrong hospital.  She reports that she is on oxycodone  for pain but ran out of them and was unable to get it filled due to the holiday weekend.  Review of records says that she was admitted July 3 through July 4.  She reports she is currently under palliative care but not under hospice Review of records show that patient has stage IV non-small cell lung cancer with bilateral hilar and supraclavicular lymphadenopathy, right arm bone pain involvement and brain mets with pathological fracture of the right humerus  Prior to Admission medications   Medication Sig Start Date End Date Taking? Authorizing Provider  acetaminophen  (TYLENOL ) 500 MG tablet Take 2 tablets (1,000 mg total) by mouth every 8 (eight) hours as needed for mild pain (pain score 1-3) or moderate pain (pain score 4-6). 02/01/24   Jerral Meth, MD  bisacodyl  (DULCOLAX) 10 MG suppository Place 1 suppository (10 mg total) rectally as needed for moderate constipation. 02/09/24   Earley Saucer, MD  bisacodyl  (DULCOLAX) 5 MG EC tablet Take 1 tablet (5 mg total) by mouth daily as needed for moderate constipation. 02/09/24   Rizwan, Saima, MD  dexamethasone  (DECADRON ) 2 MG tablet Take 3 tablets (6 mg total) by mouth daily. 02/23/24   Rai, Nydia POUR, MD  diclofenac  Sodium (VOLTAREN ) 1 % GEL Apply 2 g topically 4 (four) times daily. Patient taking differently: Apply 2 g topically as needed. 12/10/23   Zelaya, Oscar A, PA-C  docusate sodium  (COLACE) 100 MG capsule Take 1 capsule (100 mg total) by mouth at bedtime.  02/09/24   Rizwan, Saima, MD  furosemide  (LASIX ) 20 MG tablet Take 1 tablet (20 mg total) by mouth daily as needed for fluid or edema. 03/09/24 03/09/25  Amin, Sumayya, MD  levETIRAcetam  (KEPPRA ) 500 MG tablet Take 1 tablet (500 mg total) by mouth 2 (two) times daily. 02/23/24   Rai, Nydia POUR, MD  losartan  (COZAAR ) 25 MG tablet Take 1 tablet (25 mg total) by mouth daily. 03/09/24   Amin, Sumayya, MD  metoprolol  tartrate (LOPRESSOR ) 25 MG tablet Take 1 tablet (25 mg total) by mouth 2 (two) times daily. 12/15/23   Ghimire, Donalda HERO, MD  ondansetron  (ZOFRAN ) 4 MG tablet Take 1 tablet (4 mg total) by mouth every 6 (six) hours as needed for nausea or vomiting. 02/23/24   Rai, Nydia POUR, MD  oxyCODONE  (OXYCONTIN ) 15 mg 12 hr tablet Take 1 tablet (15 mg total) by mouth every 8 (eight) hours. 02/23/24   Rai, Nydia POUR, MD  pantoprazole  (PROTONIX ) 40 MG tablet Take 1 tablet (40 mg total) by mouth daily. 12/15/23 12/14/24  Ghimire, Donalda HERO, MD  polyethylene glycol (MIRALAX  / GLYCOLAX ) 17 g packet Mix 1 packet (17 g) in 4-8 ounces of water and take by mouth daily. 12/27/23   Cheryle Page, MD  potassium chloride  SA (KLOR-CON  M) 20 MEQ tablet Take 1 tablet (20 mEq total) by mouth 2 (two) times daily. 02/23/24   Rai, Ripudeep K, MD  senna-docusate (SENOKOT-S) 8.6-50 MG tablet Take 2 tablets by mouth at  bedtime. 02/09/24   Rizwan, Saima, MD  sodium phosphate  (FLEET) ENEM Place 133 mLs (1 enema total) rectally daily as needed for severe constipation. 02/09/24   Rizwan, Saima, MD  UNABLE TO FIND Left AFO  For foot drop 02/23/24   Rai, Nydia POUR, MD    Allergies: Pork allergy and Bee venom    Review of Systems  Updated Vital Signs BP (!) 175/95   Pulse 82   Temp 98.5 F (36.9 C)   Resp 19   Ht 1.676 m (5' 6)   Wt 55.5 kg   LMP 06/06/2010   SpO2 100%   BMI 19.75 kg/m   Physical Exam Vitals reviewed.  Constitutional:      Appearance: Normal appearance.  HENT:     Head: Normocephalic.     Nose: Nose  normal.     Mouth/Throat:     Mouth: Mucous membranes are moist.  Eyes:     Pupils: Pupils are equal, round, and reactive to light.  Cardiovascular:     Rate and Rhythm: Normal rate.  Pulmonary:     Effort: Pulmonary effort is normal.     Breath sounds: Normal breath sounds.  Abdominal:     General: Abdomen is flat.     Palpations: Abdomen is soft.  Musculoskeletal:        General: Normal range of motion.     Cervical back: Normal range of motion.     Comments: Some diffuse palpation over tibia, fibula, and ankle Pulses intact No obvious external signs of trauma Right upper arm with diffuse tenderness to palpation  Skin:    General: Skin is warm.     Capillary Refill: Capillary refill takes less than 2 seconds.  Neurological:     General: No focal deficit present.  Psychiatric:        Mood and Affect: Mood normal.     (all labs ordered are listed, but only abnormal results are displayed) Labs Reviewed  CBC - Abnormal; Notable for the following components:      Result Value   RBC 5.16 (*)    HCT 46.3 (*)    RDW 19.5 (*)    All other components within normal limits  COMPREHENSIVE METABOLIC PANEL WITH GFR - Abnormal; Notable for the following components:   CO2 21 (*)    Total Protein 6.1 (*)    Albumin 3.2 (*)    Alkaline Phosphatase 175 (*)    Anion gap 17 (*)    All other components within normal limits    EKG: None  Radiology: DG Foot 2 Views Left Result Date: 03/12/2024 CLINICAL DATA:  Left lower extremity pain. Osseous metastatic disease. EXAM: LEFT FOOT - 2 VIEW; LEFT TIBIA AND FIBULA - 2 VIEW COMPARISON:  None Available. FINDINGS: No acute fracture or dislocation. The bones are osteopenic. Mild arthritic changes of the left knee. No discrete lesions. Mild soft tissue swelling of the dorsum of the foot. No radiopaque foreign object or soft tissue gas. IMPRESSION: 1. No acute fracture or dislocation. 2. Osteopenia. Electronically Signed   By: Vanetta Chou M.D.    On: 03/12/2024 10:16   DG Tibia/Fibula Left Result Date: 03/12/2024 CLINICAL DATA:  Left lower extremity pain. Osseous metastatic disease. EXAM: LEFT FOOT - 2 VIEW; LEFT TIBIA AND FIBULA - 2 VIEW COMPARISON:  None Available. FINDINGS: No acute fracture or dislocation. The bones are osteopenic. Mild arthritic changes of the left knee. No discrete lesions. Mild soft tissue swelling of the dorsum of  the foot. No radiopaque foreign object or soft tissue gas. IMPRESSION: 1. No acute fracture or dislocation. 2. Osteopenia. Electronically Signed   By: Vanetta Chou M.D.   On: 03/12/2024 10:16   DG Chest Port 1 View Result Date: 03/12/2024 CLINICAL DATA:  lung cancer. EXAM: PORTABLE CHEST 1 VIEW COMPARISON:  03/08/2024 FINDINGS: Redemonstration of nonspecific ill-defined opacity overlying the right lung apex, laterally, which corresponds to opacity seen on the chest CT scan from 02/22/2024. There also diffuse increased interstitial markings throughout bilateral lungs with lower lobe predominance and lower lobe honeycombing. Findings favor chronic interstitial lung disease, better evaluated on the recent chest CT scan. No acute consolidation or lung collapse. Bilateral costophrenic angles are clear. Stable cardio-mediastinal silhouette. No acute osseous abnormalities. The soft tissues are within normal limits. IMPRESSION: No acute cardiopulmonary abnormality. Redemonstration of chronic interstitial lung disease. Electronically Signed   By: Ree Molt M.D.   On: 03/12/2024 08:08   DG Shoulder Right Result Date: 03/12/2024 CLINICAL DATA:  Right arm pain. Status post ORIF for pathologic fracture from metastatic lung cancer. EXAM: RIGHT SHOULDER - 2+ VIEW COMPARISON:  Right humerus films from 02/06/2024 FINDINGS: Bones are diffusely demineralized. No evidence for shoulder separation or dislocation. Intramedullary nail with 2 proximal interlocking screws in the humerus has been incompletely visualized. Fracture of  the mid humeral diaphysis not well evaluated. IMPRESSION: 1. No acute bony findings. 2. Incomplete visualization of the right humerus status post ORIF for pathologic fracture. Electronically Signed   By: Camellia Candle M.D.   On: 03/12/2024 06:47     Procedures   Medications Ordered in the ED  oxyCODONE -acetaminophen  (PERCOCET/ROXICET) 5-325 MG per tablet 1 tablet (1 tablet Oral Not Given 03/12/24 0751)  oxyCODONE  (Oxy IR/ROXICODONE ) immediate release tablet 10 mg (10 mg Oral Given 03/12/24 0750)    Clinical Course as of 03/12/24 1201  Mon Mar 12, 2024  0935 CBC reviewed interpreted within normal limits [DR]  0935 Chest x-Elbridge Magowan without acute cardiopulmonary abnormality noted [DR]  0936 Right shoulder without acute abnormality noted [DR]  1120 Left foot x-Jakaylah Schlafer reviewed interpreted and within normal limits radiologist interpretation concurs [DR]  1121 Left tib-fib x-Laura Caldas reviewed interpreted and within normal limits and radiologist interpretation concurs [DR]    Clinical Course User Index [DR] Levander Houston, MD                                 Medical Decision Making Amount and/or Complexity of Data Reviewed Labs: ordered. Radiology: ordered.  Risk Prescription drug management.   69-year-old female with known lung cancer with mets to right humerus presents today with right arm and leg pain.  She was discharged from the hospital on July 3 and has not had her pain medications.  On her physical exam and workup here, she continues to appear to have etiology of mets to arm as her primary source of pain today.  She was evaluated with labs and imaging of her right shoulder and her left lower leg.  The left lower leg pain is newer.  There was no obvious fractures noted.  Labs remain essentially normal.  I have contacted her palliative care team.  She reportedly has limited compliance with visits.  They will send her medications to the pharmacy.  I have discussed need for follow-up with the patient.  We  have discussed return precautions and she voices understanding.     Final diagnoses:  Right arm  pain  Primary malignant neoplasm of lung metastatic to other site, unspecified laterality [facility])    ED Discharge Orders     None          Levander Houston, MD 03/12/24 1202

## 2024-03-12 NOTE — Progress Notes (Signed)
 Palliative:  Conversation with Dr. Levander, Dr. Marvine, and Levon Klein) [place]Cous[place] NP regarding care for Regina [name]. Regina [name] is well known to palliative team. Follows with Ms. Missouri NP for outpatient palliative and pain management. Unfortunately Regina [name] often misses appointments. Ms. Missouri will send in pain medication to pharmacy for Regina [name] with plans for Regina [name] to follow up with her in outpatient clinic.   No charge  Bernarda Kitty, NP Palliative Medicine Team Pager [phone] (Please see [email or web address] for schedule) Team Phone [phone]

## 2024-03-12 NOTE — ED Triage Notes (Addendum)
 Pt arrived from home via [facility] c/o right arm pain 10/10 and left leg pain 10/10. Pt denies any injury

## 2024-03-12 NOTE — Discharge Instructions (Addendum)
 The palliative care doctors are sending your medication into the pharmacy.  Please pick it up and take as prescribed.  You must keep your appointments with palliative care and your oncology appointments.

## 2024-03-13 ENCOUNTER — Ambulatory Visit: Payer: MEDICAID

## 2024-03-13 ENCOUNTER — Inpatient Hospital Stay: Admission: RE | Admit: 2024-03-13 | Payer: MEDICAID | Source: Ambulatory Visit

## 2024-03-14 ENCOUNTER — Telehealth: Payer: Self-pay | Admitting: *Deleted

## 2024-03-14 ENCOUNTER — Telehealth: Payer: Self-pay | Admitting: Radiation Therapy

## 2024-03-14 ENCOUNTER — Ambulatory Visit: Payer: MEDICAID

## 2024-03-14 ENCOUNTER — Other Ambulatory Visit: Payer: Self-pay

## 2024-03-14 NOTE — Telephone Encounter (Signed)
 Attempted PC to patient on both phone numbers, no answer, no voicemail available.  Contacted patient's niece, Walker - informed her patient missed her MRI appointment yesterday.  This has been rescheduled for 03/28/24 at 11:50, she needs to arrive at 11:20.  Her appointment with Dr [name] has also been rescheduled on 03/29/24 at 9:30.  Our transportation coordinator has been notified.  [name] verbalizes understanding & states she will tell the patient.

## 2024-03-14 NOTE — Telephone Encounter (Signed)
 I called the patient's niece to see if she can get in touch with the patient's son to bring her in for the final radiation treatment appointment today. She is going to call him for us  with hopes that she will be able to complete today.  Devere Perch R.T.(R)(T) Radiation Special Procedures Lead

## 2024-03-15 ENCOUNTER — Ambulatory Visit: Payer: MEDICAID

## 2024-03-16 ENCOUNTER — Ambulatory Visit: Payer: MEDICAID

## 2024-03-19 ENCOUNTER — Ambulatory Visit: Payer: MEDICAID

## 2024-03-19 ENCOUNTER — Other Ambulatory Visit (HOSPITAL_COMMUNITY): Payer: Self-pay

## 2024-03-19 ENCOUNTER — Ambulatory Visit: Payer: MEDICAID | Admitting: Internal Medicine

## 2024-03-19 ENCOUNTER — Other Ambulatory Visit: Payer: Self-pay

## 2024-03-20 ENCOUNTER — Ambulatory Visit: Payer: MEDICAID

## 2024-03-21 ENCOUNTER — Ambulatory Visit: Payer: MEDICAID

## 2024-03-22 ENCOUNTER — Ambulatory Visit: Payer: MEDICAID

## 2024-03-23 ENCOUNTER — Ambulatory Visit: Payer: MEDICAID

## 2024-03-24 NOTE — Care Plan (Signed)
  Problem: Pain Goal: Improvement in pain assessment Outcome: Progressing

## 2024-03-24 NOTE — Progress Notes (Signed)
 Hospitalist Daily Progress Note     Date: 03/24/2024        Room: 709/01 Hospital Day: 5 Attending Physician: Signe Dominick Ramus, MD  Regina [name] is a 58 y.o. woman who is admitted for Lung cancer metastatic to brain    (CMD).  Subjective / Interval History / ROS   History of Present Illness   Reason for Admit: Transient encephalopathy or acute transient worsening of aphasia.  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 She is status post craniotomy and SRS several months ago with subsequent vasogenic edema, initially managed with steroids. She has been off steroids for some time and is on Keppra  500 mg b.i.d. She was admitted after presenting with transient encephalopathy or acute worsening of aphasia. Her mental status improved by the next day. It is likely fasting ketosis due to malnutrition. She was started on IV thiamine  and dextrose  infusion. She has a history of hyponatremia and is on salt tablets. Her fluid drip included normal saline at a lower than maintenance rate, primarily for dextrose , which did not significantly affect sodium levels, which were slightly increased but below the normal range. She had significant thrombocytosis on admission, which is slightly improving, presumably due to inflammation and hypovolemia, and is now managed. She has persistent hypomagnesemia, and oral magnesium  supplementation is to be increased upon discharge. She appears safe for discharge today.  She has significant aphasia, is able to understand and process conversations, and has clear intent but struggles to find specific words, resulting in dysarthric phrasing and stuttering, especially when emotionally charged. She prefers not to involve her family in cancer care. The need for a proxy for decision-making was discussed. She declined physical therapy three times and occupational therapy twice. Occupational  therapy will attempt to see her once more today.  She reports difficulty sleeping and resting. She has no adverse effects from Keppra  and finds her current pain management regimen effective.  Still feeling a bit confused and declining discharge. Mag supplemented and again low  MEDICATIONS - Keppra : 500 mg, twice daily - OxyContin : 20 mg, three times daily - Oxycodone  IR: 10 mg, as needed - Losartan : 25 mg - Sodium chloride : 1 g, three times daily    Assessment and Plan  Regina [name] is a 58 y.o. woman admitted with Lung cancer metastatic to brain    (CMD). 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 Metastatic lung adenocarcinoma: Status post craniotomy and SRS. - Continue Keppra  500 mg b.i.d. - Increase oral magnesium  supplementation upon discharge. - Declined physical therapy three times and occupational therapy twice. Occupational therapy will attempt to see her once more today. - Discussed need for proxy for decision-making.  2. Hyponatremia: Chronic. - Continue sodium chloride  1 g t.i.d.  3. Thrombocytosis. - Managed presumptively due to inflammation and hypovolemia.  4. Hypomagnesemia: Persistent. - Increase oral magnesium  supplementation upon discharge.  5. Aphasia: Significant. - Able to understand and process conversations, clear  intent but struggles to find specific words, resulting in dysarthric phrasing and stuttering, especially when emotionally charged.  6. Seizure prophylaxis. - Continue Keppra  500 mg b.i.d.  7. Vasogenic edema: History. - Off steroids for some time.  8. Hypertension: Chronic. - Continue losartan  25 mg.  9. Chronic pain: Chronic. - Agreed to try OxyContin  20 mg b.i.d. with breakthrough oxycodone  IR 10 mg p.r.n.  Follow-up - Occupational therapy will attempt to see her once more today.  Emergency Contact:  Extended Emergency Contact Information Primary Emergency Contact: Crawford,[name] Mobile Phone: [phone] Relation: Relative  Code Status: Full Code   DVT Ppx: DVT Prophylaxis and Anticoagulants (From admission, onward)    Start     Stop Route Frequency Ordered   03/20/24 0900  aspirin  EC tablet 81 mg       Last Admin Time: 03/24/24 0943     -- oral Daily 03/19/24 2239   03/21/24 2100  enoxaparin  (LOVENOX ) syringe 30 mg       Last Admin Time: 03/23/24 2032     -- subQ At bedtime 03/21/24 0942       Current Diet:    Dietary Orders  (From admission, onward)               Ordered    Ensure Plus; Twice daily with Breakfast/Supper  Once       Question Answer Comment  [facility]: Ensure Plus   Frequency: Twice daily with Breakfast/Supper      03/21/24 1347    Adult Diet- Regular  Diet effective now       References:    Medical Nutrition Management (MNM) for Registered Dietitian  Question Answer Comment  Medical Nutrition Management By RD Yes, Medical Nutrition Management By RD   Diet type: Regular      03/20/24 0604           Estimated date of DC: Mar 23, 2024   OT Recommendation: Home with 24 hour supervision, Home with intermittent assistance OT Equipment Recommended:  (Will continue to assess)   Objective   Temp:  [98 F (36.7 C)-98.8 F (37.1 C)] 98 F (36.7 C) Heart Rate:  [80-87] 80 Resp:  [16-19] 18 BP: (145-169)/(83-94) 148/86   Intake/Output Summary (Last 24 hours) at 03/24/2024 2102 Last data filed at 03/24/2024 1639 Gross per 24 hour  Intake 816 ml  Output 0 ml  Net 816 ml    Physical Examination: Physical Exam Aphasia, frustrated by difficulty communicating concepts when occaisonally word substitutions with phrases recently uttered or otherwise salient complicate her ability to communicate a point.   Lines/Drains/Airways: Patient Lines/Drains/Airways Status     Active Peripherally inserted central catheter / Central venous catheter / Peripheral intravenous line / Drain / Airway / Intraosseous line / Epidural catheter / Arterial line / Line / Nasogastric/Orogastric Tube / Hemodialysis catheter / Umbilical venous catheter / Urethral Catheter / Intrauterine Pressure Catheter / Implantable Port / NHSN Urethral Catheter / NHSN Umbilical Catheter / Intentionally Retained Foreign Objects / AV Fistula / Peripheral Nerve Catheter     Name Placement date Placement time Site Days   Peripheral IV 03/23/24 22 G Anterior;Left Forearm 03/23/24  2204  Forearm  less than 1  Labs and Results  I have reviewed the following labs and studies Results Labs  - Potassium: 20 March 2024, 3.1-3.3  - Platelet count: 19 March 2024, 550  - Platelet count: 20 March 2024, 550  - Hemoglobin: 12.8  - White blood cell count: 4.7  - CK: 19 March 2024, 160  - A1c: 6.3  Imaging  - MRI brain: Slight decrease in size of left hemisphere brain metastasis with similar surrounding edema and mass effect. Linear overlying enhancement likely related to prior biopsy tract. Size approximately 3 x 2 x 2 cm, previously 3.3 x 2.5 x 2.2 cm. Decreased bulk in size of right lateral convexity skull metastasis. No new brain metastases or suprapubic abnormalities. No acute abnormality identified.  - CT head without contrast: 19 March 2024, Stable infiltrative neoplasm within left frontal bone. Stable lesion within right parietal bone.   Labs   - Sodium: Hyponatremic  - Potassium: Hypokalemic   Labs  - Sodium: Slightly increased, below normal range  - Thrombocytosis: Slightly improving  - Hypomagnesemia: Persistent  - A.M. cortisol: 15  Results from last 7 days  Lab Units 03/24/24 1835 03/23/24 0902 03/22/24 1112  WHITE BLOOD CELL COUNT 10*3/uL 5.39 5.07 4.89  HEMOGLOBIN g/dL 88.3* 87.9* 88.0*  HEMATOCRIT % 34.5* 35.4* 34.9*  PLATELET COUNT 10*3/uL 409 460* 507*  MEAN CORPUSCULAR VOLUME fL 84.6 84.9 84.9   Results from last 7 days  Lab Units 03/24/24 1835 03/23/24 0902 03/22/24 1113 03/21/24 0953  SODIUM mmol/L 138 138 138 134*  POTASSIUM mmol/L 3.7 3.6 3.8 3.7  CHLORIDE mmol/L 107 106 108* 102  CO2 mmol/L 24 26 26 25   ANION GAP mmol/L 7 6 4* 7  BUN mg/dL 5* 3* 3* 4*  CREATININE mg/dL 9.53* 9.51* 9.49* 9.52*  GLUCOSE mg/dL 84 891* 95 80  CALCIUM  mg/dL 8.3* 8.5* 8.3* 8.9  ALBUMIN g/dL 2.8* 2.9* 2.8* 3.0*  PHOSPHORUS mg/dL 3.5 3.7  --  2.5  MAGNESIUM  mg/dL 1.6* 2.0 1.5* 2.0   Results from last 7 days  Lab Units 03/24/24 1835 03/23/24 0902 03/22/24 1113 03/21/24 0953 03/20/24 1630 03/20/24 1629  ALT U/L  --   --  6* 7  --  9  AST U/L  --   --  17 20  --  22  ALP U/L  --   --  172* 180*  --  197*  GGT U/L  --   --   --   --  34  --   BILIRUBIN DIRECT mg/dL  --   --   --  0.0  --  0.1  BILIRUBIN TOTAL mg/dL  --   --  0.3 0.3  --  0.4  ALBUMIN g/dL 2.8* 2.9* 2.8* 3.0*  --  3.3*  TOTAL PROTEIN g/dL  --   --  5.4* 5.6*  --  6.4   Results from last 7 days  Lab Units 03/21/24 0953  INR  0.9  PROTIME seconds 12.4    Micro: No results found for this visit on 03/19/24 (from the past week).  Medications  Scheduled Medications: aspirin , 81 mg, oral, Daily enoxaparin , 30 mg, subcutaneous, At Bedtime folic acid , 1 mg, oral, Daily levETIRAcetam , 500 mg, oral, BID losartan , 25 mg, oral, Daily magnesium  sulfate, 4 g, intravenous, Once oxyCODONE , 20 mg, oral, Q12H SCH sertraline , 50 mg, oral,  Daily sodium chloride , 1 g, oral, TID with meals thiamine , 500 mg, intravenous, Daily  Followed by NOREEN ON 03/26/2024] thiamine , 100 mg, oral, Daily  PRN Medications: .  acetaminophen  .  albuterol  HFA .  dextrose  .  dextrose  .  oxyCODONE   Continuous Medications:     Electronically signed by: Signe Dominick Ramus, MD

## 2024-03-25 NOTE — Progress Notes (Signed)
 Hospitalist Daily Progress Note     Date: 03/25/2024        Room: 709/01 Hospital Day: 6 Attending Physician: Signe Dominick Ramus, MD  Regina [name] is a 58 y.o. woman who is admitted for Lung cancer metastatic to brain    (CMD).  Subjective / Interval History / ROS   History of Present Illness  Reason for Admit: Possible encephalopathy.  Regina [name] is a 58 year old woman with metastatic non-small cell lung cancer, which has spread to Regina [name] bones and brain. She has undergone brain stereotactic radiosurgery [facility] craniotomy, and radiation therapy for a pathologic fracture of Regina humerus and calvarial metastases. Regina [name] adherence to Regina [name] medical regimen has been inconsistent, and she has limited access to oncology care. 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 Few people are aware of Regina [name] medical complications and treatment plan. She suffers from significant aphasia due to a prior condition, which makes it difficult for Regina [name] to explain Regina reason for Regina [name] admission, possibly encephalopathy. She presented to Regina emergency room by EMS and is unable to express Regina [name] reason for admission. She is stable on Regina [name] pain regimen but is not ready for discharge.  Regina [name] reports leg pain and slight improvement in Regina [name] speech. She has an upcoming appointment with Regina [name] oncologist on Tuesday. She is taking salt tablets as part of Regina [name] treatment.  MEDICATIONS - Magnesium  - Thiamine  - Potassium    Assessment and Plan  Regina [name] is a 58 y.o. woman admitted with Lung cancer metastatic to brain    (CMD). 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 Metastatic lung adenocarcinoma: Status post craniotomy and SRS. - Continue Keppra  500 mg b.i.d. - Increase oral magnesium  supplementation upon discharge. - Declined physical therapy three times and occupational therapy twice. Occupational therapy will attempt to see Regina [name] once more today. - Discussed need for proxy for decision-making.  2. Hyponatremia: Chronic. - Continue sodium chloride  1 g t.i.d.  3. Thrombocytosis. - Managed presumptively due to inflammation and hypovolemia.  4. Hypomagnesemia: Persistent. - Increase oral magnesium  supplementation upon discharge.  5. Aphasia: Significant. - Able to understand and process conversations, clear intent but struggles to find specific words, resulting in dysarthric phrasing and stuttering, especially when emotionally charged.  6. Seizure prophylaxis. - Continue Keppra  500 mg b.i.d.  7. Vasogenic edema: History. - Off steroids for some time.  8. Hypertension: Chronic. - Continue losartan  25 mg.  9. Chronic pain: Chronic. - Agreed to try OxyContin  20 mg b.i.d. with breakthrough oxycodone  IR 10 mg p.r.n.  Follow-up - Occupational therapy will attempt to see Regina [name] once more today.   1. Metastatic lung adenocarcinoma: Stable. - Sodium levels slightly low upon admission,  possibly contributing to symptoms. - Off Decadron  for some time, possibly causing brain swelling recurrence. - Steroid levels checked, safe to discontinue Decadron . - Receiving fluids, calories, magnesium , potassium, and thiamine  to boost energy. - Effective pain medication regimen. - eval need for leg x-ray. - Additional  potassium administered, labs to be checked in Regina morning.  Follow-up - Call to niece later today.  Emergency Contact:  Extended Emergency Contact Information Primary Emergency Contact: Crawford,[name] Mobile Phone: [phone] Relation: Relative  Code Status: Full Code   DVT Ppx: DVT Prophylaxis and Anticoagulants (From admission, onward)    Start     Stop Route Frequency Ordered   03/20/24 0900  aspirin  EC tablet 81 mg       Last Admin Time: 03/25/24 0825     -- oral Daily 03/19/24 2239   03/21/24 2100  enoxaparin  (LOVENOX ) syringe 30 mg       Last Admin Time: 03/24/24 2103     -- subQ At bedtime 03/21/24 0942       Current Diet:    Dietary Orders  (From admission, onward)               Ordered    Ensure Plus; Twice daily with Breakfast/Supper  Once       Question Answer Comment  [facility]: Ensure Plus   Frequency: Twice daily with Breakfast/Supper      03/21/24 1347    Adult Diet- Regular  Diet effective now       References:    Medical Nutrition Management (MNM) for Registered Dietitian  Question Answer Comment  Medical Nutrition Management By RD Yes, Medical Nutrition Management By RD   Diet type: Regular      03/20/24 0604           Estimated date of DC: Mar 25, 2024   OT Recommendation: Home with 24 hour supervision, Home with intermittent assistance OT Equipment Recommended:  (Will continue to assess)   Objective   Temp:  [98 F (36.7 C)-99.5 F (37.5 C)] 99.5 F (37.5 C) Heart Rate:  [77-90] 81 Resp:  [16-19] 18 BP: (143-152)/(82-86) 143/82  Intake/Output Summary (Last 24 hours) at 03/25/2024 1847 Last data filed at 03/25/2024 1616 Gross per 24 hour  Intake 720 ml  Output 500 ml  Net 220 ml    Physical Examination: Physical Exam Aphasia, frustrated by difficulty communicating concepts when occaisonally word substitutions with phrases recently uttered or otherwise salient complicate Regina [name] ability to communicate a point.    General  Appearance: Normal Vital signs: Within normal limits HEENT: Within normal limits Respiratory: Within normal limits Cardiovascular: Normal heart sounds Back, Musculoskeletal: Left lower leg tenderness Skin: Warm and dry, no rash Neurological: Normal Psychiatric: Normal  Lines/Drains/Airways: [name] Lines/Drains/Airways Status     Active Peripherally inserted central catheter / Central venous catheter / Peripheral intravenous line / Drain / Airway / Intraosseous line / Epidural catheter / Arterial line / Line / Nasogastric/Orogastric Tube / Hemodialysis catheter / Umbilical venous catheter / Urethral Catheter / Intrauterine Pressure Catheter / Implantable Port / NHSN Urethral Catheter / NHSN Umbilical Catheter / Intentionally Retained Foreign Objects / AV Fistula / Peripheral Nerve Catheter     Name Placement date Placement time Site Days   Peripheral IV 03/23/24 22 G Anterior;Left Forearm 03/23/24  2204  Forearm  1             Labs and Results  I have reviewed Regina following labs and studies Results Labs  - Potassium: 20 March 2024, 3.1-3.3  - Platelet count: 19 March 2024, 550  - Platelet count: 20 March 2024, 550  - Hemoglobin: 12.8  - White blood cell count: 4.7  - CK: 19 March 2024, 160  - A1c: 6.3  Imaging  - MRI brain: Slight decrease in size of left hemisphere brain metastasis with similar surrounding edema and mass effect. Linear overlying enhancement likely related to prior biopsy tract. Size approximately 3 x 2 x 2 cm, previously 3.3 x 2.5 x 2.2 cm. Decreased bulk in size of right lateral convexity skull metastasis. No new brain metastases or suprapubic abnormalities. No acute abnormality identified.  - CT head without contrast: 19 March 2024, Stable infiltrative neoplasm within left frontal bone. Stable lesion within right parietal bone.   Labs  - Sodium: Hyponatremic  - Potassium: Hypokalemic   Labs  - Sodium: Slightly increased, below normal range  -  Thrombocytosis: Slightly improving  - Hypomagnesemia: Persistent  - A.M. cortisol: 15   Labs: Slightly low sodium levels  Results from last 7 days  Lab Units 03/25/24 0925 03/24/24 1835 03/23/24 0902  WHITE BLOOD CELL COUNT 10*3/uL 5.89 5.39 5.07  HEMOGLOBIN g/dL 88.2* 88.3* 87.9*  HEMATOCRIT % 34.9* 34.5* 35.4*  PLATELET COUNT 10*3/uL 446 409 460*  MEAN CORPUSCULAR VOLUME fL 84.9 84.6 84.9   Results from last 7 days  Lab Units 03/25/24 0925 03/24/24 1835 03/23/24 0902  SODIUM mmol/L 137 138 138  POTASSIUM mmol/L 3.5 3.7 3.6  CHLORIDE mmol/L 105 107 106  CO2 mmol/L 25 24 26   ANION GAP mmol/L 7 7 6   BUN mg/dL 6* 5* 3*  CREATININE mg/dL 9.54* 9.53* 9.51*  GLUCOSE mg/dL 893* 84 891*  CALCIUM  mg/dL 8.3* 8.3* 8.5*  ALBUMIN g/dL 2.8* 2.8* 2.9*  PHOSPHORUS mg/dL 3.4 3.5 3.7  MAGNESIUM  mg/dL 2.2 1.6* 2.0   Results from last 7 days  Lab Units 03/25/24 0925 03/24/24 1835 03/23/24 0902 03/22/24 1113 03/21/24 0953 03/20/24 1630 03/20/24 1629  ALT U/L 6*  --   --  6* 7  --  9  AST U/L 15  --   --  17 20  --  22  ALP U/L 155*  --   --  172* 180*  --  197*  GGT U/L  --   --   --   --   --  34  --   BILIRUBIN DIRECT mg/dL 0.1  --   --   --  0.0  --  0.1  BILIRUBIN TOTAL mg/dL 0.3  --   --  0.3 0.3  --  0.4  ALBUMIN g/dL 2.8* 2.8* 2.9* 2.8* 3.0*  --  3.3*  TOTAL PROTEIN g/dL 5.4*  --   --  5.4* 5.6*  --  6.4   Results from last 7 days  Lab Units 03/21/24 0953  INR  0.9  PROTIME seconds 12.4    Micro: No results found for this visit on 03/19/24 (from Regina past week).  Medications  Scheduled Medications: aspirin , 81 mg, oral, Daily enoxaparin , 30 mg, subcutaneous, At Bedtime folic acid , 1 mg, oral, Daily levETIRAcetam , 500 mg, oral, BID losartan , 25 mg, oral, Daily magnesium  oxide, 800 mg, oral, TID oxyCODONE , 20 mg, oral, Q12H SCH sertraline , 50 mg, oral, Daily sodium chloride , 1 g, oral, TID with meals thiamine , 500 mg, intravenous, Daily  Followed by NOREEN  ON 03/26/2024] thiamine , 100 mg, oral, Daily   PRN Medications: .  acetaminophen  .  albuterol  HFA .  dextrose  .  dextrose  .  oxyCODONE   Continuous Medications:     Electronically signed by: Signe Dominick Ramus, MD

## 2024-03-25 NOTE — Care Plan (Signed)
  Problem: Pain Goal: Improvement in pain assessment Outcome: Progressing

## 2024-03-26 ENCOUNTER — Ambulatory Visit: Payer: MEDICAID

## 2024-03-27 ENCOUNTER — Telehealth: Payer: Self-pay | Admitting: *Deleted

## 2024-03-27 ENCOUNTER — Inpatient Hospital Stay: Payer: MEDICAID | Admitting: Nurse Practitioner

## 2024-03-27 ENCOUNTER — Other Ambulatory Visit (HOSPITAL_COMMUNITY): Payer: Self-pay

## 2024-03-27 ENCOUNTER — Other Ambulatory Visit: Payer: Self-pay

## 2024-03-27 ENCOUNTER — Inpatient Hospital Stay: Payer: MEDICAID

## 2024-03-27 ENCOUNTER — Telehealth: Payer: Self-pay | Admitting: Physician Assistant

## 2024-03-27 ENCOUNTER — Encounter: Payer: Self-pay | Admitting: Internal Medicine

## 2024-03-27 ENCOUNTER — Inpatient Hospital Stay
Admission: RE | Admit: 2024-03-27 | Discharge: 2024-03-27 | Disposition: A | Discharge status: Home / Self Care | Payer: Self-pay | Source: Ambulatory Visit | Attending: Internal Medicine

## 2024-03-27 ENCOUNTER — Other Ambulatory Visit: Payer: Self-pay | Admitting: Physician Assistant

## 2024-03-27 ENCOUNTER — Inpatient Hospital Stay: Payer: MEDICAID | Attending: Internal Medicine

## 2024-03-27 ENCOUNTER — Inpatient Hospital Stay: Payer: MEDICAID | Admitting: Physician Assistant

## 2024-03-27 DIAGNOSIS — C3411 Malignant neoplasm of upper lobe, right bronchus or lung: Secondary | ICD-10-CM

## 2024-03-27 DIAGNOSIS — C3490 Malignant neoplasm of unspecified part of unspecified bronchus or lung: Secondary | ICD-10-CM

## 2024-03-27 NOTE — Telephone Encounter (Signed)
 Attempted to contact the patient to inform her of rescheduled appointment details but was unsuccessful. The patient is active on MyChart.

## 2024-03-27 NOTE — Radiation Completion Notes (Addendum)
  Radiation Oncology         [phone] ________________________________  Name: Regina [name] MRN: [id number]  Date: 03/12/2024  DOB: [birth year]  Referring Physician: ROSALINE BOHR, M.D. Date of Service: 2024-03-27 Radiation Oncologist: Adina Barge, M.D. [facility]     RADIATION ONCOLOGY END OF TREATMENT NOTE     Diagnosis: 58 yo woman with a painful right humerus bone metastasis from right upper lung cancer, previously irradiated   Intent: Palliative     ==========DELIVERED PLANS==========  First Treatment Date: 2024-02-07 Last Treatment Date: 2024-03-12   Plan Name: Ext_R Site: Humerus, Right Technique: 3D Mode: Photon Dose Per Fraction: 3 Gy Prescribed Dose (Delivered / Prescribed): 27 Gy / 30 Gy Prescribed Fxs (Delivered / Prescribed): 9 / 10     ==========ON TREATMENT VISIT DATES========== 2024-02-09, 2024-03-08    See weekly On Treatment Notes in Epic for details in the Media tab (listed as Progress notes on the On Treatment Visit Dates listed above).  She tolerated the treatments relatively well with only modest fatigue.  The patient has decided to transition her care to hospice.  ------------------------------------------------   [name], MD [facility]  Radiation Oncology Direct Dial: [phone]  Fax: [phone] [email or web address]  Skype  LinkedIn

## 2024-03-27 NOTE — Progress Notes (Signed)
 Navigator reached out to the pts niece, Regina [name], at this time to confirm if patient was getting discharged today. Regina [name] confirmed that that was the plan and she is waiting for someone to call her to pick her up. Navigator let Regina [name] know that the pt has appts for provider and infusion tomorrow. Regina [name] states she won't be able to take her to those appts. Navigator informs Regina [name] that she will reach out to transportation coordinator and ask that he reach out to her.  Navigator sent email to [employer], transportation coordinator at 4:26pm to let him know the pt is likely to be discharged and will need transportation. Navigator suggested reaching out to the pt's niece to arrange. Response received at 4:39 that he will address the pt's transportation needs.

## 2024-03-27 NOTE — Transitions of Care (Post Inpatient/ED Visit) (Signed)
   03/27/2024  Name: Regina [name] MRN: [id number] DOB: [birth year]  Today's TOC FU Call Status: Today's TOC FU Call Status:: Unsuccessful Call (1st Attempt) Unsuccessful Call (1st Attempt) Date: 03/27/24  Attempted to reach the patient regarding the most recent Inpatient/ED visit.  Follow Up Plan: Additional outreach attempts will be made to reach the patient to complete the Transitions of Care (Post Inpatient/ED visit) call.   Andrea Dimes RN, BSN [place]  [facility] RN Care Manager [phone]

## 2024-03-27 NOTE — Progress Notes (Signed)
 Case Management Discharge Note        CSN: [id number] DOB: [birth year] Service: General Medicine Location: 709/01  Patient Class: Inpatient  DC Disposition: : Home or Self Care  Discharge DC Disposition: : Home or Self Care  Discharge Referrals Case closed, patient/family agree with disposition plan: Yes           Dorothe VEAR Hover, RN

## 2024-03-28 ENCOUNTER — Emergency Department (HOSPITAL_COMMUNITY): Payer: MEDICAID

## 2024-03-28 ENCOUNTER — Encounter: Payer: Self-pay | Admitting: Hematology and Oncology

## 2024-03-28 ENCOUNTER — Inpatient Hospital Stay (HOSPITAL_COMMUNITY)
Admission: EM | Admit: 2024-03-28 | Discharge: 2024-04-06 | DRG: 100 | Disposition: A | Discharge status: Hospice | Payer: MEDICAID | Attending: Internal Medicine | Admitting: Internal Medicine

## 2024-03-28 ENCOUNTER — Other Ambulatory Visit: Payer: MEDICAID

## 2024-03-28 ENCOUNTER — Other Ambulatory Visit: Payer: Self-pay

## 2024-03-28 DIAGNOSIS — C7951 Secondary malignant neoplasm of bone: Secondary | ICD-10-CM | POA: Diagnosis present

## 2024-03-28 DIAGNOSIS — D72829 Elevated white blood cell count, unspecified: Secondary | ICD-10-CM | POA: Diagnosis present

## 2024-03-28 DIAGNOSIS — Z85118 Personal history of other malignant neoplasm of bronchus and lung: Secondary | ICD-10-CM

## 2024-03-28 DIAGNOSIS — R569 Unspecified convulsions: Secondary | ICD-10-CM | POA: Diagnosis not present

## 2024-03-28 DIAGNOSIS — G40909 Epilepsy, unspecified, not intractable, without status epilepticus: Secondary | ICD-10-CM | POA: Diagnosis present

## 2024-03-28 DIAGNOSIS — Z806 Family history of leukemia: Secondary | ICD-10-CM

## 2024-03-28 DIAGNOSIS — Z66 Do not resuscitate: Secondary | ICD-10-CM | POA: Diagnosis present

## 2024-03-28 DIAGNOSIS — F419 Anxiety disorder, unspecified: Secondary | ICD-10-CM | POA: Diagnosis present

## 2024-03-28 DIAGNOSIS — M4856XA Collapsed vertebra, not elsewhere classified, lumbar region, initial encounter for fracture: Secondary | ICD-10-CM | POA: Diagnosis present

## 2024-03-28 DIAGNOSIS — Z515 Encounter for palliative care: Secondary | ICD-10-CM | POA: Diagnosis not present

## 2024-03-28 DIAGNOSIS — G894 Chronic pain syndrome: Secondary | ICD-10-CM | POA: Diagnosis present

## 2024-03-28 DIAGNOSIS — I509 Heart failure, unspecified: Secondary | ICD-10-CM | POA: Diagnosis not present

## 2024-03-28 DIAGNOSIS — Z87891 Personal history of nicotine dependence: Secondary | ICD-10-CM | POA: Diagnosis not present

## 2024-03-28 DIAGNOSIS — I110 Hypertensive heart disease with heart failure: Secondary | ICD-10-CM | POA: Diagnosis present

## 2024-03-28 DIAGNOSIS — I471 Supraventricular tachycardia, unspecified: Secondary | ICD-10-CM | POA: Diagnosis present

## 2024-03-28 DIAGNOSIS — C3490 Malignant neoplasm of unspecified part of unspecified bronchus or lung: Secondary | ICD-10-CM | POA: Diagnosis present

## 2024-03-28 DIAGNOSIS — R4189 Other symptoms and signs involving cognitive functions and awareness: Secondary | ICD-10-CM | POA: Diagnosis present

## 2024-03-28 DIAGNOSIS — K5903 Drug induced constipation: Secondary | ICD-10-CM | POA: Diagnosis present

## 2024-03-28 DIAGNOSIS — G893 Neoplasm related pain (acute) (chronic): Secondary | ICD-10-CM | POA: Diagnosis present

## 2024-03-28 DIAGNOSIS — Z638 Other specified problems related to primary support group: Secondary | ICD-10-CM

## 2024-03-28 DIAGNOSIS — T402X5A Adverse effect of other opioids, initial encounter: Secondary | ICD-10-CM | POA: Diagnosis present

## 2024-03-28 DIAGNOSIS — C7931 Secondary malignant neoplasm of brain: Secondary | ICD-10-CM | POA: Diagnosis present

## 2024-03-28 DIAGNOSIS — E860 Dehydration: Secondary | ICD-10-CM | POA: Diagnosis present

## 2024-03-28 DIAGNOSIS — I4891 Unspecified atrial fibrillation: Secondary | ICD-10-CM | POA: Diagnosis present

## 2024-03-28 DIAGNOSIS — C3411 Malignant neoplasm of upper lobe, right bronchus or lung: Secondary | ICD-10-CM | POA: Diagnosis not present

## 2024-03-28 DIAGNOSIS — Z681 Body mass index (BMI) 19 or less, adult: Secondary | ICD-10-CM

## 2024-03-28 DIAGNOSIS — Z923 Personal history of irradiation: Secondary | ICD-10-CM

## 2024-03-28 DIAGNOSIS — Z91014 Allergy to mammalian meats: Secondary | ICD-10-CM

## 2024-03-28 DIAGNOSIS — D75839 Thrombocytosis, unspecified: Secondary | ICD-10-CM | POA: Diagnosis present

## 2024-03-28 DIAGNOSIS — R54 Age-related physical debility: Secondary | ICD-10-CM | POA: Diagnosis present

## 2024-03-28 DIAGNOSIS — I5032 Chronic diastolic (congestive) heart failure: Secondary | ICD-10-CM | POA: Diagnosis present

## 2024-03-28 DIAGNOSIS — Z818 Family history of other mental and behavioral disorders: Secondary | ICD-10-CM

## 2024-03-28 DIAGNOSIS — J439 Emphysema, unspecified: Secondary | ICD-10-CM | POA: Diagnosis present

## 2024-03-28 DIAGNOSIS — J449 Chronic obstructive pulmonary disease, unspecified: Secondary | ICD-10-CM | POA: Diagnosis present

## 2024-03-28 DIAGNOSIS — Z8249 Family history of ischemic heart disease and other diseases of the circulatory system: Secondary | ICD-10-CM | POA: Diagnosis not present

## 2024-03-28 DIAGNOSIS — G936 Cerebral edema: Secondary | ICD-10-CM | POA: Diagnosis present

## 2024-03-28 DIAGNOSIS — Z79891 Long term (current) use of opiate analgesic: Secondary | ICD-10-CM

## 2024-03-28 DIAGNOSIS — Z87898 Personal history of other specified conditions: Secondary | ICD-10-CM

## 2024-03-28 DIAGNOSIS — Z7189 Other specified counseling: Secondary | ICD-10-CM | POA: Diagnosis not present

## 2024-03-28 DIAGNOSIS — R4701 Aphasia: Secondary | ICD-10-CM | POA: Diagnosis present

## 2024-03-28 DIAGNOSIS — E872 Acidosis, unspecified: Secondary | ICD-10-CM | POA: Diagnosis present

## 2024-03-28 DIAGNOSIS — R64 Cachexia: Secondary | ICD-10-CM | POA: Diagnosis present

## 2024-03-28 DIAGNOSIS — Z79899 Other long term (current) drug therapy: Secondary | ICD-10-CM

## 2024-03-28 DIAGNOSIS — R000 Tachycardia, unspecified: Principal | ICD-10-CM

## 2024-03-28 LAB — COMPREHENSIVE METABOLIC PANEL WITH GFR
ALT: 9 U/L (ref 0–44)
AST: 24 U/L (ref 15–41)
Albumin: 3 g/dL — ABNORMAL LOW (ref 3.5–5.0)
Alkaline Phosphatase: 168 U/L — ABNORMAL HIGH (ref 38–126)
Anion gap: 18 — ABNORMAL HIGH (ref 5–15)
BUN: <5 mg/dL — ABNORMAL LOW (ref 6–20)
CO2: 20 mmol/L — ABNORMAL LOW (ref 22–32)
Calcium: 9 mg/dL (ref 8.9–10.3)
Chloride: 96 mmol/L — ABNORMAL LOW (ref 98–111)
Creatinine, Ser: 0.59 mg/dL (ref 0.44–1.00)
GFR, Estimated: >60 mL/min (ref >60)
Glucose, Bld: 130 mg/dL — ABNORMAL HIGH (ref 70–99)
Potassium: 3.2 mmol/L — ABNORMAL LOW (ref 3.5–5.1)
Sodium: 134 mmol/L — ABNORMAL LOW (ref 135–145)
Total Bilirubin: 0.9 mg/dL (ref 0.0–1.2)
Total Protein: 6.9 g/dL (ref 6.5–8.1)

## 2024-03-28 LAB — I-STAT CG4 LACTIC ACID, ED
Lactic Acid, Venous: 2.9 mmol/L (ref 0.5–1.9)
Lactic Acid, Venous: 1.6 mmol/L (ref 0.5–1.9)

## 2024-03-28 LAB — LIPASE, BLOOD: Lipase: 20 U/L (ref 11–51)

## 2024-03-28 LAB — I-STAT CHEM 8, ED
BUN: <3 mg/dL — ABNORMAL LOW (ref 6–20)
Calcium, Ion: 0.98 mmol/L — ABNORMAL LOW (ref 1.15–1.40)
Chloride: 97 mmol/L — ABNORMAL LOW (ref 98–111)
Creatinine, Ser: 0.4 mg/dL — ABNORMAL LOW (ref 0.44–1.00)
Glucose, Bld: 128 mg/dL — ABNORMAL HIGH (ref 70–99)
HCT: 44 % (ref 36.0–46.0)
Hemoglobin: 15 g/dL (ref 12.0–15.0)
Potassium: 3.1 mmol/L — ABNORMAL LOW (ref 3.5–5.1)
Sodium: 134 mmol/L — ABNORMAL LOW (ref 135–145)
TCO2: 25 mmol/L (ref 22–32)

## 2024-03-28 LAB — URINALYSIS, ROUTINE W REFLEX MICROSCOPIC
Bilirubin Urine: NEGATIVE
Glucose, UA: 50 mg/dL — AB
Hgb urine dipstick: NEGATIVE
Ketones, ur: 5 mg/dL — AB
Leukocytes,Ua: NEGATIVE
Nitrite: NEGATIVE
Protein, ur: 100 mg/dL — AB
Specific Gravity, Urine: 1.01 (ref 1.005–1.030)
pH: 8 (ref 5.0–8.0)

## 2024-03-28 LAB — CBC
HCT: 42.2 % (ref 36.0–46.0)
Hemoglobin: 13.8 g/dL (ref 12.0–15.0)
MCH: 28.4 pg (ref 26.0–34.0)
MCHC: 32.7 g/dL (ref 30.0–36.0)
MCV: 86.8 fL (ref 80.0–100.0)
Platelets: 569 K/uL — ABNORMAL HIGH (ref 150–400)
RBC: 4.86 MIL/uL (ref 3.87–5.11)
RDW: 17.4 % — ABNORMAL HIGH (ref 11.5–15.5)
WBC: 10.6 K/uL — ABNORMAL HIGH (ref 4.0–10.5)
nRBC: 0 % (ref 0.0–0.2)

## 2024-03-28 LAB — RAPID URINE DRUG SCREEN, HOSP PERFORMED
Amphetamines: NOT DETECTED
Barbiturates: NOT DETECTED
Benzodiazepines: NOT DETECTED
Cocaine: NOT DETECTED
Opiates: NOT DETECTED
Tetrahydrocannabinol: POSITIVE — AB

## 2024-03-28 LAB — TROPONIN I (HIGH SENSITIVITY)
Troponin I (High Sensitivity): 14 ng/L (ref <18)
Troponin I (High Sensitivity): 16 ng/L (ref <18)

## 2024-03-28 LAB — CBG MONITORING, ED: Glucose-Capillary: 132 mg/dL — ABNORMAL HIGH (ref 70–99)

## 2024-03-28 LAB — ETHANOL: Alcohol, Ethyl (B): <15 mg/dL (ref <15)

## 2024-03-28 MED ORDER — DEXAMETHASONE SODIUM PHOSPHATE 10 MG/ML IJ SOLN
10.0000 mg | Freq: Once | INTRAMUSCULAR | Status: AC
  Administered 2024-03-28: 10 mg via INTRAVENOUS
  Filled 2024-03-28: qty 1

## 2024-03-28 MED ORDER — METOPROLOL TARTRATE 25 MG PO TABS
25.0000 mg | ORAL_TABLET | Freq: Two times a day (BID) | ORAL | Status: DC
  Administered 2024-03-28: 25 mg via ORAL
  Filled 2024-03-28: qty 1

## 2024-03-28 MED ORDER — DILTIAZEM HCL-DEXTROSE 125-5 MG/125ML-% IV SOLN (PREMIX)
5.0000 mg/h | INTRAVENOUS | Status: DC
  Administered 2024-03-28: 5 mg/h via INTRAVENOUS
  Administered 2024-03-29: 15 mg/h via INTRAVENOUS
  Filled 2024-03-28 (×4): qty 125

## 2024-03-28 MED ORDER — LEVETIRACETAM (KEPPRA) 500 MG/5 ML ADULT IV PUSH
2000.0000 mg | Freq: Once | INTRAVENOUS | Status: AC
  Administered 2024-03-28: 2000 mg via INTRAVENOUS

## 2024-03-28 MED ORDER — DEXAMETHASONE SODIUM PHOSPHATE 4 MG/ML IJ SOLN
4.0000 mg | Freq: Two times a day (BID) | INTRAMUSCULAR | Status: DC
  Administered 2024-03-28 – 2024-04-03 (×12): 4 mg via INTRAVENOUS
  Filled 2024-03-28 (×12): qty 1

## 2024-03-28 MED ORDER — POTASSIUM CHLORIDE 10 MEQ/100ML IV SOLN
10.0000 meq | INTRAVENOUS | Status: AC
  Administered 2024-03-28 (×2): 10 meq via INTRAVENOUS
  Filled 2024-03-28: qty 100

## 2024-03-28 MED ORDER — IOHEXOL 350 MG/ML SOLN
60.0000 mL | Freq: Once | INTRAVENOUS | Status: AC | PRN
  Administered 2024-03-28: 60 mL via INTRAVENOUS

## 2024-03-28 MED ORDER — MAGNESIUM SULFATE 2 GM/50ML IV SOLN
2.0000 g | Freq: Once | INTRAVENOUS | Status: AC
  Administered 2024-03-28: 2 g via INTRAVENOUS

## 2024-03-28 MED ORDER — POTASSIUM CHLORIDE CRYS ER 20 MEQ PO TBCR
40.0000 meq | EXTENDED_RELEASE_TABLET | Freq: Once | ORAL | Status: AC
  Administered 2024-03-28: 40 meq via ORAL
  Filled 2024-03-28: qty 2

## 2024-03-28 MED ORDER — LORAZEPAM 2 MG/ML IJ SOLN: 1.0000 mg | Freq: Once | INTRAMUSCULAR | Status: DC | PRN

## 2024-03-28 MED ORDER — DILTIAZEM HCL 25 MG/5ML IV SOLN
15.0000 mg | Freq: Once | INTRAVENOUS | Status: AC
  Administered 2024-03-28: 15 mg via INTRAVENOUS

## 2024-03-28 MED ORDER — LORAZEPAM 2 MG/ML IJ SOLN: 1.0000 mg | Freq: Once | INTRAMUSCULAR | Status: AC

## 2024-03-28 MED ORDER — LACTATED RINGERS IV BOLUS
1000.0000 mL | Freq: Once | INTRAVENOUS | Status: AC
  Administered 2024-03-28: 1000 mL via INTRAVENOUS

## 2024-03-28 MED ORDER — LORAZEPAM 2 MG/ML IJ SOLN
INTRAMUSCULAR | Status: AC
  Administered 2024-03-28: 1 mg via INTRAVENOUS
  Filled 2024-03-28: qty 1

## 2024-03-28 NOTE — ED Triage Notes (Signed)
 Pt to the ed from home via ems with a CC of weakness. Pt friend was visiting and called 911 because pt was increasingly altered and having some expressive aphasia. Family and friend could not give a LSW time. Pt also has a painful swollen left arm. Pt unable to get out of bed when normally independent.

## 2024-03-28 NOTE — ED Provider Notes (Signed)
 [place] EMERGENCY DEPARTMENT AT [facility] Provider Note   CSN: [id number] Arrival date & time: 03/28/24  1358     Patient presents with: Weakness   Regina [name] is a 58 y.o. female.   This is a 57 year old female presenting emergency department by EMS.  Reportedly called out for chest pain/shortness of breath.  EMS reported that patient's family stated that she was more confused than normal.  Does have a history of aphasia.  Patient is difficult historian secondary to her aphasia, but able to answer short questions.  Endorses some vision changes, chest pain, shortness of breath as well as abdominal pain.  She did have a vomit bag with her that had nonbloody nonbilious vomit in it.   Weakness      Prior to Admission medications   Medication Sig Start Date End Date Taking? Authorizing Provider  acetaminophen  (TYLENOL ) 500 MG tablet Take 2 tablets (1,000 mg total) by mouth every 8 (eight) hours as needed for mild pain (pain score 1-3) or moderate pain (pain score 4-6). 02/01/24   Jerral Meth, MD  bisacodyl  (DULCOLAX) 10 MG suppository Place 1 suppository (10 mg total) rectally as needed for moderate constipation. 02/09/24   Earley Saucer, MD  bisacodyl  (DULCOLAX) 5 MG EC tablet Take 1 tablet (5 mg total) by mouth daily as needed for moderate constipation. 02/09/24   Rizwan, Saima, MD  dexamethasone  (DECADRON ) 2 MG tablet Take 3 tablets (6 mg total) by mouth daily. 02/23/24   Rai, Nydia POUR, MD  diclofenac  Sodium (VOLTAREN ) 1 % GEL Apply 2 g topically 4 (four) times daily. Patient taking differently: Apply 2 g topically as needed. 12/10/23   Zelaya, Oscar A, PA-C  docusate sodium  (COLACE) 100 MG capsule Take 1 capsule (100 mg total) by mouth at bedtime. 02/09/24   Rizwan, Saima, MD  furosemide  (LASIX ) 20 MG tablet Take 1 tablet (20 mg total) by mouth daily as needed for fluid or edema. 03/09/24 03/09/25  Amin, Sumayya, MD  levETIRAcetam  (KEPPRA ) 500 MG tablet Take 1 tablet (500  mg total) by mouth 2 (two) times daily. 02/23/24   Rai, Nydia POUR, MD  losartan  (COZAAR ) 25 MG tablet Take 1 tablet (25 mg total) by mouth daily. 03/09/24   Amin, Sumayya, MD  metoprolol  tartrate (LOPRESSOR ) 25 MG tablet Take 1 tablet (25 mg total) by mouth 2 (two) times daily. 12/15/23   Ghimire, Donalda HERO, MD  ondansetron  (ZOFRAN ) 4 MG tablet Take 1 tablet (4 mg total) by mouth every 6 (six) hours as needed for nausea or vomiting. 02/23/24   Rai, Nydia POUR, MD  oxyCODONE  (OXYCONTIN ) 15 mg 12 hr tablet Take 1 tablet (15 mg total) by mouth every 8 (eight) hours. 03/12/24   Pickenpack-Cousar, Athena N, NP  oxyCODONE  10 MG TABS Take 0.5 tablets (5 mg total) by mouth every 4 (four) hours as needed for severe pain (pain score 7-10). 03/12/24   Pickenpack-Cousar, Fannie SAILOR, NP  pantoprazole  (PROTONIX ) 40 MG tablet Take 1 tablet (40 mg total) by mouth daily. 12/15/23 12/14/24  Ghimire, Donalda HERO, MD  polyethylene glycol (MIRALAX  / GLYCOLAX ) 17 g packet Mix 1 packet (17 g) in 4-8 ounces of water and take by mouth daily. 12/27/23   Cheryle Page, MD  potassium chloride  SA (KLOR-CON  M) 20 MEQ tablet Take 1 tablet (20 mEq total) by mouth 2 (two) times daily. 02/23/24   Rai, Ripudeep K, MD  senna-docusate (SENOKOT-S) 8.6-50 MG tablet Take 2 tablets by mouth at bedtime. 02/09/24  Rizwan, Saima, MD  sodium phosphate  (FLEET) ENEM Place 133 mLs (1 enema total) rectally daily as needed for severe constipation. 02/09/24   Rizwan, Saima, MD  UNABLE TO FIND Left AFO  For foot drop 02/23/24   Rai, Nydia POUR, MD    Allergies: Pork allergy and Bee venom    Review of Systems  Neurological:  Positive for weakness.    Updated Vital Signs BP (!) 174/114 (BP Location: Right Arm)   Pulse (!) 111   Temp 98.6 F (37 C) (Rectal)   Resp (!) 28   Ht 5' 6 (1.676 m)   Wt 55 kg   LMP 06/06/2010   SpO2 98%   BMI 19.57 kg/m   Physical Exam Vitals and nursing note reviewed.  Constitutional:      Comments: Cachectic  HENT:      Head: Normocephalic.     Nose: Nose normal.     Mouth/Throat:     Mouth: Mucous membranes are moist.  Eyes:     Extraocular Movements: Extraocular movements intact.     Pupils: Pupils are equal, round, and reactive to light.  Cardiovascular:     Rate and Rhythm: Regular rhythm. Tachycardia present.  Pulmonary:     Effort: Pulmonary effort is normal.     Breath sounds: Normal breath sounds.  Abdominal:     General: Abdomen is flat.     Palpations: Abdomen is soft.     Tenderness: There is abdominal tenderness. There is no guarding or rebound.  Musculoskeletal:     Right lower leg: No edema.     Left lower leg: No edema.  Skin:    General: Skin is warm and dry.     Capillary Refill: Capillary refill takes less than 2 seconds.  Neurological:     Mental Status: She is alert.     Comments: Aphasic.  No facial droop.  No localizing weakness.  Normal sensation.  Psychiatric:        Mood and Affect: Mood normal.        Behavior: Behavior normal.     (all labs ordered are listed, but only abnormal results are displayed) Labs Reviewed  COMPREHENSIVE METABOLIC PANEL WITH GFR - Abnormal; Notable for the following components:      Result Value   Sodium 134 (*)    Potassium 3.2 (*)    Chloride 96 (*)    CO2 20 (*)    Glucose, Bld 130 (*)    BUN <5 (*)    Albumin 3.0 (*)    Alkaline Phosphatase 168 (*)    Anion gap 18 (*)    All other components within normal limits  CBC - Abnormal; Notable for the following components:   WBC 10.6 (*)    RDW 17.4 (*)    Platelets 569 (*)    All other components within normal limits  CBG MONITORING, ED - Abnormal; Notable for the following components:   Glucose-Capillary 132 (*)    All other components within normal limits  I-STAT CG4 LACTIC ACID, ED - Abnormal; Notable for the following components:   Lactic Acid, Venous 2.9 (*)    All other components within normal limits  I-STAT CHEM 8, ED - Abnormal; Notable for the following components:    Sodium 134 (*)    Potassium 3.1 (*)    Chloride 97 (*)    BUN <3 (*)    Creatinine, Ser 0.40 (*)    Glucose, Bld 128 (*)    Calcium ,  Ion 0.98 (*)    All other components within normal limits  CULTURE, BLOOD (ROUTINE X 2)  CULTURE, BLOOD (ROUTINE X 2)  LIPASE, BLOOD  URINALYSIS, ROUTINE W REFLEX MICROSCOPIC  ETHANOL  RAPID URINE DRUG SCREEN, HOSP PERFORMED  I-STAT CG4 LACTIC ACID, ED  TROPONIN I (HIGH SENSITIVITY)    EKG: None  Radiology: DG Chest Portable 1 View Result Date: 03/28/2024 CLINICAL DATA:  Chest pain EXAM: PORTABLE CHEST 1 VIEW COMPARISON:  Chest x-ray performed March 12, 2024 FINDINGS: Numerous leads overlie the patient. Mild interstitial prominence, similar. No significant pleural effusion or pneumothorax. IMPRESSION: 1. Chronic appearing interstitial changes. 2. No pleural effusion or pneumothorax. Electronically Signed   By: Maude Naegeli M.D.   On: 03/28/2024 16:01   CT Angio Chest PE W and/or Wo Contrast Result Date: 03/28/2024 CLINICAL DATA:  Concern for pulmonary edema.  Also sepsis. EXAM: CT ANGIOGRAPHY CHEST CT ABDOMEN AND PELVIS WITH CONTRAST TECHNIQUE: Multidetector CT imaging of the chest was performed using the standard protocol during bolus administration of intravenous contrast. Multiplanar CT image reconstructions and MIPs were obtained to evaluate the vascular anatomy. Multidetector CT imaging of the abdomen and pelvis was performed using the standard protocol during bolus administration of intravenous contrast. RADIATION DOSE REDUCTION: This exam was performed according to the departmental dose-optimization program which includes automated exposure control, adjustment of the mA and/or kV according to patient size and/or use of iterative reconstruction technique. CONTRAST:  60mL OMNIPAQUE  IOHEXOL  350 MG/ML SOLN COMPARISON:  Chest radiograph dated 03/28/2024 and CT dated 03/23/2024. FINDINGS: Evaluation of this exam is limited due to respiratory motion and streak  artifact caused by patient's arms. CTA CHEST FINDINGS Cardiovascular: There is no cardiomegaly or pericardial effusion. Mild atherosclerotic calcification of the thoracic aorta. No aneurysmal dilatation or dissection. No pulmonary artery embolus identified. Mediastinum/Nodes: There is bilateral hilar and mediastinal adenopathy measuring 12 mm in short axis on the right. Subcarinal lymph node measures approximately 2.2 cm. Overall similar or slightly improved adenopathy compared to prior CT. The esophagus is grossly unremarkable. No mediastinal fluid collection. Lungs/Pleura: Background of emphysema on biapical subpleural scarring. Interval decrease in the size of the right apical subpleural nodule. A 1.8 cm subpleural bandlike density in the right apex (22/9) likely residual infiltrate or scarring. Continued follow-up recommended. No new nodule. No consolidative changes. There is no pleural effusion or pneumothorax. The central airways are patent. Musculoskeletal: No acute osseous pathology. Right humeral intramedullary nail. Review of the MIP images confirms the above findings. CT ABDOMEN and PELVIS FINDINGS No intra-abdominal free air or free fluid. Hepatobiliary: The liver is unremarkable. No biliary dilatation. The gallbladder is unremarkable Pancreas: Unremarkable. No pancreatic ductal dilatation or surrounding inflammatory changes. Spleen: Normal in size without focal abnormality. Adrenals/Urinary Tract: The adrenal glands unremarkable. There is no hydronephrosis on either side. There is symmetric enhancement and excretion of contrast by both kidneys. The visualized ureters and urinary bladder appear unremarkable. Stomach/Bowel: There is no bowel obstruction. Mild diffuse thickened appearance of the colon may be related to underdistention or represent colitis. Clinical correlation is recommended. Several small scattered colonic diverticula. The appendix is normal. Vascular/Lymphatic: Mild aortoiliac  atherosclerotic disease. The IVC is unremarkable no portal venous gas. There is no adenopathy. Reproductive: Several calcified uterine fibroids. No suspicious adnexal masses. Other: None Musculoskeletal: Osteopenia with degenerative changes. L5 compression fracture with approximately 40-50% loss of vertebral body height, likely acute or subacute. Clinical correlation recommended. Review of the MIP images confirms the above findings. IMPRESSION: 1. No  CT evidence of pulmonary artery embolus. 2. Interval decrease in the size of the right apical subpleural nodule. A 1.8 cm subpleural bandlike density in the right apex likely residual infiltrate or scarring. Continued follow-up recommended. 3. Distension of the colon versus colitis.  No bowel obstruction. 4. Mild colonic diverticulosis. 5. L5 compression fracture, likely acute or subacute. 6. Aortic Atherosclerosis (ICD10-I70.0) and Emphysema (ICD10-J43.9). Electronically Signed   By: Vanetta Chou M.D.   On: 03/28/2024 16:00   CT ABDOMEN PELVIS W CONTRAST Result Date: 03/28/2024 CLINICAL DATA:  Concern for pulmonary edema.  Also sepsis. EXAM: CT ANGIOGRAPHY CHEST CT ABDOMEN AND PELVIS WITH CONTRAST TECHNIQUE: Multidetector CT imaging of the chest was performed using the standard protocol during bolus administration of intravenous contrast. Multiplanar CT image reconstructions and MIPs were obtained to evaluate the vascular anatomy. Multidetector CT imaging of the abdomen and pelvis was performed using the standard protocol during bolus administration of intravenous contrast. RADIATION DOSE REDUCTION: This exam was performed according to the departmental dose-optimization program which includes automated exposure control, adjustment of the mA and/or kV according to patient size and/or use of iterative reconstruction technique. CONTRAST:  60mL OMNIPAQUE  IOHEXOL  350 MG/ML SOLN COMPARISON:  Chest radiograph dated 03/28/2024 and CT dated 03/23/2024. FINDINGS:  Evaluation of this exam is limited due to respiratory motion and streak artifact caused by patient's arms. CTA CHEST FINDINGS Cardiovascular: There is no cardiomegaly or pericardial effusion. Mild atherosclerotic calcification of the thoracic aorta. No aneurysmal dilatation or dissection. No pulmonary artery embolus identified. Mediastinum/Nodes: There is bilateral hilar and mediastinal adenopathy measuring 12 mm in short axis on the right. Subcarinal lymph node measures approximately 2.2 cm. Overall similar or slightly improved adenopathy compared to prior CT. The esophagus is grossly unremarkable. No mediastinal fluid collection. Lungs/Pleura: Background of emphysema on biapical subpleural scarring. Interval decrease in the size of the right apical subpleural nodule. A 1.8 cm subpleural bandlike density in the right apex (22/9) likely residual infiltrate or scarring. Continued follow-up recommended. No new nodule. No consolidative changes. There is no pleural effusion or pneumothorax. The central airways are patent. Musculoskeletal: No acute osseous pathology. Right humeral intramedullary nail. Review of the MIP images confirms the above findings. CT ABDOMEN and PELVIS FINDINGS No intra-abdominal free air or free fluid. Hepatobiliary: The liver is unremarkable. No biliary dilatation. The gallbladder is unremarkable Pancreas: Unremarkable. No pancreatic ductal dilatation or surrounding inflammatory changes. Spleen: Normal in size without focal abnormality. Adrenals/Urinary Tract: The adrenal glands unremarkable. There is no hydronephrosis on either side. There is symmetric enhancement and excretion of contrast by both kidneys. The visualized ureters and urinary bladder appear unremarkable. Stomach/Bowel: There is no bowel obstruction. Mild diffuse thickened appearance of the colon may be related to underdistention or represent colitis. Clinical correlation is recommended. Several small scattered colonic  diverticula. The appendix is normal. Vascular/Lymphatic: Mild aortoiliac atherosclerotic disease. The IVC is unremarkable no portal venous gas. There is no adenopathy. Reproductive: Several calcified uterine fibroids. No suspicious adnexal masses. Other: None Musculoskeletal: Osteopenia with degenerative changes. L5 compression fracture with approximately 40-50% loss of vertebral body height, likely acute or subacute. Clinical correlation recommended. Review of the MIP images confirms the above findings. IMPRESSION: 1. No CT evidence of pulmonary artery embolus. 2. Interval decrease in the size of the right apical subpleural nodule. A 1.8 cm subpleural bandlike density in the right apex likely residual infiltrate or scarring. Continued follow-up recommended. 3. Distension of the colon versus colitis.  No bowel obstruction. 4. Mild colonic diverticulosis.  5. L5 compression fracture, likely acute or subacute. 6. Aortic Atherosclerosis (ICD10-I70.0) and Emphysema (ICD10-J43.9). Electronically Signed   By: Vanetta Chou M.D.   On: 03/28/2024 16:00   CT Head Wo Contrast Result Date: 03/28/2024 CLINICAL DATA:  Neuro deficit, acute, stroke suspected EXAM: CT HEAD WITHOUT CONTRAST TECHNIQUE: Contiguous axial images were obtained from the base of the skull through the vertex without intravenous contrast. RADIATION DOSE REDUCTION: This exam was performed according to the departmental dose-optimization program which includes automated exposure control, adjustment of the mA and/or kV according to patient size and/or use of iterative reconstruction technique. COMPARISON:  CT head 03/19/2024, MRI head 02/22/2024 FINDINGS: Brain: Patchy and confluent areas of decreased attenuation are noted throughout the deep and periventricular white matter of the cerebral hemispheres bilaterally, compatible with chronic microvascular ischemic disease. No evidence of large-territorial acute infarction. No parenchymal hemorrhage.  Redemonstration of a left frontal lobe 2.5 cm mass with associated marked vasogenic edema leading to grossly stable 4 mm left-to-right midline shift. No extra-axial collection. No hydrocephalus. Basilar cisterns are patent. Vascular: No hyperdense vessel. Skull: No acute fracture or focal lesion.  Prior left craniotomy. Sinuses/Orbits: Paranasal sinuses and mastoid air cells are clear. The orbits are unremarkable. Other: None. 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 Electronically Signed   By: Morgane  Naveau M.D.   On: 03/28/2024 15:53     .Critical Care  Performed by: Neysa Caron PARAS, DO Authorized by: Neysa Caron PARAS, DO   Critical care provider statement:    Critical care time (minutes):  30   Critical care was necessary to treat or prevent imminent or life-threatening deterioration of the following conditions:  Cardiac failure, circulatory failure and CNS failure or compromise   Critical care was time spent personally by me on the following activities:  Development of treatment plan with patient or surrogate, discussions with consultants, evaluation of patient's response to treatment, examination of patient, ordering and review of laboratory studies, ordering and review of radiographic studies, ordering and performing treatments and interventions, pulse oximetry, re-evaluation of patient's condition and review of old charts .Cardioversion  Date/Time: 03/28/2024 4:05 PM  Performed by: Neysa Caron PARAS, DO Authorized by: Neysa Caron PARAS, DO   Consent:    Consent obtained:  Emergent situation Pre-procedure details:    Cardioversion basis:  Emergent   Rhythm:  Supraventricular tachycardia   Electrode placement:  Anterior-posterior Attempt one:    Cardioversion mode:  Synchronous   Waveform:  Biphasic   Shock (Joules):  200   Shock outcome:  Conversion to normal sinus rhythm Post-procedure  details:    Patient tolerance of procedure:  Tolerated well, no immediate complications    Medications Ordered in the ED  diltiazem  (CARDIZEM ) 125 mg in dextrose  5% 125 mL (1 mg/mL) infusion (5 mg/hr Intravenous New Bag/Given 03/28/24 1532)  potassium chloride  10 mEq in 100 mL IVPB (10 mEq Intravenous New Bag/Given 03/28/24 1540)  dexamethasone  (DECADRON ) injection 10 mg (has no administration in time range)  levETIRAcetam  (KEPPRA ) undiluted injection 2,000 mg (2,000 mg Intravenous Given 03/28/24 1534)  diltiazem  (CARDIZEM ) injection 15 mg (15 mg Intravenous Given 03/28/24 1533)  magnesium  sulfate IVPB 2 g 50 mL (0 g Intravenous Stopped 03/28/24 1558)  LORazepam  (ATIVAN ) injection 1 mg (1 mg Intravenous Given 03/28/24 1534)  lactated ringers  bolus 1,000 mL (0 mLs Intravenous Stopped 03/28/24 1558)  iohexol  (OMNIPAQUE ) 350 MG/ML injection 60 mL (60 mLs Intravenous  Contrast Given 03/28/24 1544)                                    Medical Decision Making This is a 58 year old female with complex past medical history to include 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  Slightly tachycardic on arrival and tachypneic.  Was quite hypertensive with a blood pressure 205/112, but maintaining oxygen saturation on room air.  No localizing deficits on my initial exam.  However I was called to bedside with patient having had witnessed tonic-clonic seizure for roughly 30 seconds.  Was given 1 mg of Ativan .  Seizure stopped.  Loaded with Keppra .  She then was having runs of SVT/A-fib RVR heart rate in the 150s.  She then had a run of SVT heart rate in the 220s, she was still postictal, had thready pulses.  Therefore cardioverted, back to sinus tachycardia, but then devolved into A-fib with RVR.  Given diltiazem  bolus and started on a drip.  Per chart review does have a history of hypomagnesemia, given 2 g of mag as well as a liter of normal saline.  Taken emergently to the CT scan  to evaluate intracranial hemorrhage/mass effect given her metastatic disease.  I did not appreciate obvious hemorrhage.  Given her complaint of shortness of breath as well as abdominal pain.  CT chest ordered and CT abdomen ordered.  Initial lab work at time of signout with some dehydration with low sodium and chloride and potassium.  Receiving fluids.  Elevated lactate of 2.9 likely secondary to her seizure.  Minor leukocytosis.  I did speak with patient's niece who is listed as patient's point of contact stated that aunt is a full code.  She also noted that her aunt was unable to get some of her medications at the pharmacy yesterday, but is unsure which ones.  Care signed out to afternoon team, CT scans pending.  But will likely need admission for what appears to be new onset A-fib RVR currently on diltiazem   Amount and/or Complexity of Data Reviewed External Data Reviewed:     Details: Admitted to [facility]; per their discharge summary is supposed to be on Keppra  as well as magnesium  supplementation. Labs: ordered. Radiology: ordered.  Risk Prescription drug management. Parenteral controlled substances. Drug therapy requiring intensive monitoring for toxicity. Decision regarding hospitalization. Diagnosis or treatment significantly limited by social determinants of health. Risk Details: Poor health literacy.      Final diagnoses:  None    ED Discharge Orders     None          Neysa Caron PARAS, DO 03/28/24 1611

## 2024-03-28 NOTE — ED Notes (Signed)
 Pt  right eye drooping and right arm weaker then left. Advised MD Patt and he reported to bedside.

## 2024-03-28 NOTE — ED Notes (Signed)
 Called CCMD and placed pt on monitor.

## 2024-03-28 NOTE — H&P (Signed)
 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 - She also has Aphasia at baseline, has been poorly compliant with her cancer treatments lately, she is also on Keppra  for seizure prophylaxis however unclear if she is compliant . -This is her third hospitalization in July, early July hospitalized at [facility] long with fluid overload, CHF exacerbation, subsequently admitted 7/14-7/22, with generalized body pains, dyspnea, encephalopathy treated with supportive care and discharged home 2 days ago. -Presented to the emergency room with weakness today, unable to provide much meaningful history, while in the ED she had a tonic-clonic seizure was given 1 mg of Ativan , seizure stopped.  Case discussed with neurology and she was loaded with Keppra  .after this she had an episode of SVT, treated with adenosine followed by cardioversion.  Now slowly improving, unable to provide much meaningful history Labs noted sodium of 134, potassium 3.4, BUN less than 5, creatinine 0.5, WBC 10.6, hemoglobin 13, UDS positive for cannabis - CTA, noted no PE, right apical subpleural nodule, L5 compression fracture, acute versus subacute , Left frontal lobe 2.5 cm mass with associated marked vasogenic edema leading to grossly stable 4 mm left-to-right midline shift.  Review of Systems: Unable to obtain  Past Medical History:  Diagnosis Date   Allergy    Anxiety    Brain mass    Cancer (HCC)    COPD (chronic obstructive pulmonary disease) (HCC)    Emphysema of lung (HCC)    Hypertension     Past Surgical History:  Procedure  Laterality Date   APPLICATION OF CRANIAL NAVIGATION Left 10/07/2023   Procedure: APPLICATION OF CRANIAL NAVIGATION;  Surgeon: Gillie Duncans, MD;  Location: [facility] OR;  Service: Neurosurgery;  Laterality: Left;   CRANIOTOMY Left 10/07/2023   Procedure: FRONTAL CRANIOTOMY FOR ABSCESS;  Surgeon: Gillie Duncans, MD;  Location: [facility] OR;  Service: Neurosurgery;  Laterality: Left;   dental procedure     HUMERUS IM NAIL Right 10/10/2023   Procedure: INTRAMEDULLARY (IM) NAIL HUMERAL RIGHT;  Surgeon: Genelle Standing, MD;  Location: [facility] OR;  Service: Orthopedics;  Laterality: Right;     reports that she quit smoking about 4 years ago. Her smoking use included cigarettes. She has never used smokeless tobacco. She reports current alcohol  use of about 6.0 standard drinks of alcohol  per week. She reports current drug use. Frequency: 4.00 times per week. Drug: Marijuana.  Allergies  Allergen Reactions   Pork Allergy Nausea And Vomiting   Bee Venom Hives    Family History  Problem Relation Age of Onset   Leukemia Mother    Hypertension Mother    Hypertension Father    Cancer Neg Hx    Diabetes Neg Hx    Heart disease Neg Hx    Colon cancer Neg Hx    Esophageal cancer Neg Hx    Rectal cancer Neg Hx    Stomach cancer Neg Hx      Prior to Admission medications   Medication Sig Start Date End Date Taking? Authorizing Provider  acetaminophen  (TYLENOL ) 500 MG tablet Take 2 tablets (1,000 mg total) by mouth every 8 (eight) hours as needed for mild pain (pain score 1-3)  or moderate pain (pain score 4-6). 02/01/24   Jerral Meth, MD  bisacodyl  (DULCOLAX) 10 MG suppository Place 1 suppository (10 mg total) rectally as needed for moderate constipation. 02/09/24   Earley Saucer, MD  bisacodyl  (DULCOLAX) 5 MG EC tablet Take 1 tablet (5 mg total) by mouth daily as needed for moderate constipation. 02/09/24   Rizwan, Saima, MD  dexamethasone  (DECADRON ) 2 MG tablet Take 3 tablets (6 mg total) by mouth daily. 02/23/24    Rai, Nydia POUR, MD  diclofenac  Sodium (VOLTAREN ) 1 % GEL Apply 2 g topically 4 (four) times daily. Patient taking differently: Apply 2 g topically as needed. 12/10/23   Zelaya, Oscar A, PA-C  docusate sodium  (COLACE) 100 MG capsule Take 1 capsule (100 mg total) by mouth at bedtime. 02/09/24   Rizwan, Saima, MD  furosemide  (LASIX ) 20 MG tablet Take 1 tablet (20 mg total) by mouth daily as needed for fluid or edema. 03/09/24 03/09/25  Amin, Sumayya, MD  levETIRAcetam  (KEPPRA ) 500 MG tablet Take 1 tablet (500 mg total) by mouth 2 (two) times daily. 02/23/24   Rai, Nydia POUR, MD  losartan  (COZAAR ) 25 MG tablet Take 1 tablet (25 mg total) by mouth daily. 03/09/24   Amin, Sumayya, MD  metoprolol  tartrate (LOPRESSOR ) 25 MG tablet Take 1 tablet (25 mg total) by mouth 2 (two) times daily. 12/15/23   Ghimire, Donalda HERO, MD  ondansetron  (ZOFRAN ) 4 MG tablet Take 1 tablet (4 mg total) by mouth every 6 (six) hours as needed for nausea or vomiting. 02/23/24   Rai, Nydia POUR, MD  oxyCODONE  (OXYCONTIN ) 15 mg 12 hr tablet Take 1 tablet (15 mg total) by mouth every 8 (eight) hours. 03/12/24   Pickenpack-Cousar, Fannie SAILOR, NP  oxyCODONE  10 MG TABS Take 0.5 tablets (5 mg total) by mouth every 4 (four) hours as needed for severe pain (pain score 7-10). 03/12/24   Pickenpack-Cousar, Fannie SAILOR, NP  pantoprazole  (PROTONIX ) 40 MG tablet Take 1 tablet (40 mg total) by mouth daily. 12/15/23 12/14/24  Ghimire, Donalda HERO, MD  polyethylene glycol (MIRALAX  / GLYCOLAX ) 17 g packet Mix 1 packet (17 g) in 4-8 ounces of water and take by mouth daily. 12/27/23   Cheryle Page, MD  potassium chloride  SA (KLOR-CON  M) 20 MEQ tablet Take 1 tablet (20 mEq total) by mouth 2 (two) times daily. 02/23/24   Rai, Ripudeep K, MD  senna-docusate (SENOKOT-S) 8.6-50 MG tablet Take 2 tablets by mouth at bedtime. 02/09/24   Rizwan, Saima, MD  sodium phosphate  (FLEET) ENEM Place 133 mLs (1 enema total) rectally daily as needed for severe constipation. 02/09/24   Rizwan, Saima,  MD  UNABLE TO FIND Left AFO  For foot drop 02/23/24   Davia Nydia POUR, MD    Physical Exam: Vitals:   03/28/24 1545 03/28/24 1556 03/28/24 1558 03/28/24 1737  BP: (!) 174/114   (!) 157/104  Pulse: (!) 111   (!) 115  Resp: (!) 28   (!) 22  Temp:  98.6 F (37 C) 98.6 F (37 C)   TempSrc:  Rectal Rectal   SpO2: 98%   100%  Weight:      Height:          Niccoli ill frail cachectic, sitting up in bed, awake alert, oriented to self and partly to place, cognitive deficits, dysarthria Respiratory: Poor air movement bilaterally Cardiovascular: S1S2/RRR tachycardic Abdomen: soft, non tender, Bowel sounds positive.  Musculoskeletal: No joint deformity upper and lower extremities. Ext: no edema Skin: no  rashes Neurologic: As all extremities, localizing signs, aphasia, dysarthria Psychiatric: Affect, anxious  Labs on Admission: I have personally reviewed following labs and imaging studies  CBC: Recent Labs  Lab 03/28/24 1414 03/28/24 1445  WBC 10.6*  --   HGB 13.8 15.0  HCT 42.2 44.0  MCV 86.8  --   PLT 569*  --    Basic Metabolic Panel: Recent Labs  Lab 03/28/24 1414 03/28/24 1445  NA 134* 134*  K 3.2* 3.1*  CL 96* 97*  CO2 20*  --   GLUCOSE 130* 128*  BUN <5* <3*  CREATININE 0.59 0.40*  CALCIUM  9.0  --    GFR: Estimated Creatinine Clearance: 64.9 mL/min (A) (by C-G formula based on SCr of 0.4 mg/dL (L)). Liver Function Tests: Recent Labs  Lab 03/28/24 1414  AST 24  ALT 9  ALKPHOS 168*  BILITOT 0.9  PROT 6.9  ALBUMIN 3.0*   Recent Labs  Lab 03/28/24 1414  LIPASE 20   No results for input(s): AMMONIA in the last 168 hours. Coagulation Profile: No results for input(s): INR, PROTIME in the last 168 hours. Cardiac Enzymes: No results for input(s): CKTOTAL, CKMB, CKMBINDEX, TROPONINI in the last 168 hours. BNP (last 3 results) No results for input(s): PROBNP in the last 8760 hours. HbA1C: No results for input(s): HGBA1C in the  last 72 hours. CBG: Recent Labs  Lab 03/28/24 1430  GLUCAP 132*   Lipid Profile: No results for input(s): CHOL, HDL, LDLCALC, TRIG, CHOLHDL, LDLDIRECT in the last 72 hours. Thyroid  Function Tests: No results for input(s): TSH, T4TOTAL, FREET4, T3FREE, THYROIDAB in the last 72 hours. Anemia Panel: No results for input(s): VITAMINB12, FOLATE, FERRITIN, TIBC, IRON, RETICCTPCT in the last 72 hours. Urine analysis:    Component Value Date/Time   COLORURINE COLORLESS (A) 03/28/2024 1530   APPEARANCEUR CLEAR 03/28/2024 1530   LABSPEC 1.010 03/28/2024 1530   PHURINE 8.0 03/28/2024 1530   GLUCOSEU 50 (A) 03/28/2024 1530   HGBUR NEGATIVE 03/28/2024 1530   BILIRUBINUR NEGATIVE 03/28/2024 1530   KETONESUR 5 (A) 03/28/2024 1530   PROTEINUR 100 (A) 03/28/2024 1530   NITRITE NEGATIVE 03/28/2024 1530   LEUKOCYTESUR NEGATIVE 03/28/2024 1530   Sepsis Labs: @LABRCNTIP (procalcitonin:4,lacticidven:4) )No results found for this or any previous visit (from the past 240 hours).   Radiological Exams on Admission: DG Chest Portable 1 View Result Date: 03/28/2024 CLINICAL DATA:  Chest pain EXAM: PORTABLE CHEST 1 VIEW COMPARISON:  Chest x-ray performed March 12, 2024 FINDINGS: Numerous leads overlie the patient. Mild interstitial prominence, similar. No significant pleural effusion or pneumothorax. IMPRESSION: 1. Chronic appearing interstitial changes. 2. No pleural effusion or pneumothorax. Electronically Signed   By: Maude Naegeli M.D.   On: 03/28/2024 16:01   CT Angio Chest PE W and/or Wo Contrast Result Date: 03/28/2024 CLINICAL DATA:  Concern for pulmonary edema.  Also sepsis. EXAM: CT ANGIOGRAPHY CHEST CT ABDOMEN AND PELVIS WITH CONTRAST TECHNIQUE: Multidetector CT imaging of the chest was performed using the standard protocol during bolus administration of intravenous contrast. Multiplanar CT image reconstructions and MIPs were obtained to evaluate the vascular anatomy.  Multidetector CT imaging of the abdomen and pelvis was performed using the standard protocol during bolus administration of intravenous contrast. RADIATION DOSE REDUCTION: This exam was performed according to the departmental dose-optimization program which includes automated exposure control, adjustment of the mA and/or kV according to patient size and/or use of iterative reconstruction technique. CONTRAST:  60mL OMNIPAQUE  IOHEXOL  350 MG/ML SOLN COMPARISON:  Chest radiograph dated  03/28/2024 and CT dated 03/23/2024. FINDINGS: Evaluation of this exam is limited due to respiratory motion and streak artifact caused by patient's arms. CTA CHEST FINDINGS Cardiovascular: There is no cardiomegaly or pericardial effusion. Mild atherosclerotic calcification of the thoracic aorta. No aneurysmal dilatation or dissection. No pulmonary artery embolus identified. Mediastinum/Nodes: There is bilateral hilar and mediastinal adenopathy measuring 12 mm in short axis on the right. Subcarinal lymph node measures approximately 2.2 cm. Overall similar or slightly improved adenopathy compared to prior CT. The esophagus is grossly unremarkable. No mediastinal fluid collection. Lungs/Pleura: Background of emphysema on biapical subpleural scarring. Interval decrease in the size of the right apical subpleural nodule. A 1.8 cm subpleural bandlike density in the right apex (22/9) likely residual infiltrate or scarring. Continued follow-up recommended. No new nodule. No consolidative changes. There is no pleural effusion or pneumothorax. The central airways are patent. Musculoskeletal: No acute osseous pathology. Right humeral intramedullary nail. Review of the MIP images confirms the above findings. CT ABDOMEN and PELVIS FINDINGS No intra-abdominal free air or free fluid. Hepatobiliary: The liver is unremarkable. No biliary dilatation. The gallbladder is unremarkable Pancreas: Unremarkable. No pancreatic ductal dilatation or surrounding  inflammatory changes. Spleen: Normal in size without focal abnormality. Adrenals/Urinary Tract: The adrenal glands unremarkable. There is no hydronephrosis on either side. There is symmetric enhancement and excretion of contrast by both kidneys. The visualized ureters and urinary bladder appear unremarkable. Stomach/Bowel: There is no bowel obstruction. Mild diffuse thickened appearance of the colon may be related to underdistention or represent colitis. Clinical correlation is recommended. Several small scattered colonic diverticula. The appendix is normal. Vascular/Lymphatic: Mild aortoiliac atherosclerotic disease. The IVC is unremarkable no portal venous gas. There is no adenopathy. Reproductive: Several calcified uterine fibroids. No suspicious adnexal masses. Other: None Musculoskeletal: Osteopenia with degenerative changes. L5 compression fracture with approximately 40-50% loss of vertebral body height, likely acute or subacute. Clinical correlation recommended. Review of the MIP images confirms the above findings. IMPRESSION: 1. No CT evidence of pulmonary artery embolus. 2. Interval decrease in the size of the right apical subpleural nodule. A 1.8 cm subpleural bandlike density in the right apex likely residual infiltrate or scarring. Continued follow-up recommended. 3. Distension of the colon versus colitis.  No bowel obstruction. 4. Mild colonic diverticulosis. 5. L5 compression fracture, likely acute or subacute. 6. Aortic Atherosclerosis (ICD10-I70.0) and Emphysema (ICD10-J43.9). Electronically Signed   By: Vanetta Chou M.D.   On: 03/28/2024 16:00   CT ABDOMEN PELVIS W CONTRAST Result Date: 03/28/2024 CLINICAL DATA:  Concern for pulmonary edema.  Also sepsis. EXAM: CT ANGIOGRAPHY CHEST CT ABDOMEN AND PELVIS WITH CONTRAST TECHNIQUE: Multidetector CT imaging of the chest was performed using the standard protocol during bolus administration of intravenous contrast. Multiplanar CT image  reconstructions and MIPs were obtained to evaluate the vascular anatomy. Multidetector CT imaging of the abdomen and pelvis was performed using the standard protocol during bolus administration of intravenous contrast. RADIATION DOSE REDUCTION: This exam was performed according to the departmental dose-optimization program which includes automated exposure control, adjustment of the mA and/or kV according to patient size and/or use of iterative reconstruction technique. CONTRAST:  60mL OMNIPAQUE  IOHEXOL  350 MG/ML SOLN COMPARISON:  Chest radiograph dated 03/28/2024 and CT dated 03/23/2024. FINDINGS: Evaluation of this exam is limited due to respiratory motion and streak artifact caused by patient's arms. CTA CHEST FINDINGS Cardiovascular: There is no cardiomegaly or pericardial effusion. Mild atherosclerotic calcification of the thoracic aorta. No aneurysmal dilatation or dissection. No pulmonary artery embolus identified.  Mediastinum/Nodes: There is bilateral hilar and mediastinal adenopathy measuring 12 mm in short axis on the right. Subcarinal lymph node measures approximately 2.2 cm. Overall similar or slightly improved adenopathy compared to prior CT. The esophagus is grossly unremarkable. No mediastinal fluid collection. Lungs/Pleura: Background of emphysema on biapical subpleural scarring. Interval decrease in the size of the right apical subpleural nodule. A 1.8 cm subpleural bandlike density in the right apex (22/9) likely residual infiltrate or scarring. Continued follow-up recommended. No new nodule. No consolidative changes. There is no pleural effusion or pneumothorax. The central airways are patent. Musculoskeletal: No acute osseous pathology. Right humeral intramedullary nail. Review of the MIP images confirms the above findings. CT ABDOMEN and PELVIS FINDINGS No intra-abdominal free air or free fluid. Hepatobiliary: The liver is unremarkable. No biliary dilatation. The gallbladder is unremarkable  Pancreas: Unremarkable. No pancreatic ductal dilatation or surrounding inflammatory changes. Spleen: Normal in size without focal abnormality. Adrenals/Urinary Tract: The adrenal glands unremarkable. There is no hydronephrosis on either side. There is symmetric enhancement and excretion of contrast by both kidneys. The visualized ureters and urinary bladder appear unremarkable. Stomach/Bowel: There is no bowel obstruction. Mild diffuse thickened appearance of the colon may be related to underdistention or represent colitis. Clinical correlation is recommended. Several small scattered colonic diverticula. The appendix is normal. Vascular/Lymphatic: Mild aortoiliac atherosclerotic disease. The IVC is unremarkable no portal venous gas. There is no adenopathy. Reproductive: Several calcified uterine fibroids. No suspicious adnexal masses. Other: None Musculoskeletal: Osteopenia with degenerative changes. L5 compression fracture with approximately 40-50% loss of vertebral body height, likely acute or subacute. Clinical correlation recommended. Review of the MIP images confirms the above findings. IMPRESSION: 1. No CT evidence of pulmonary artery embolus. 2. Interval decrease in the size of the right apical subpleural nodule. A 1.8 cm subpleural bandlike density in the right apex likely residual infiltrate or scarring. Continued follow-up recommended. 3. Distension of the colon versus colitis.  No bowel obstruction. 4. Mild colonic diverticulosis. 5. L5 compression fracture, likely acute or subacute. 6. Aortic Atherosclerosis (ICD10-I70.0) and Emphysema (ICD10-J43.9). Electronically Signed   By: Vanetta Chou M.D.   On: 03/28/2024 16:00   CT Head Wo Contrast Result Date: 03/28/2024 CLINICAL DATA:  Neuro deficit, acute, stroke suspected EXAM: CT HEAD WITHOUT CONTRAST TECHNIQUE: Contiguous axial images were obtained from the base of the skull through the vertex without intravenous contrast. RADIATION DOSE REDUCTION:  This exam was performed according to the departmental dose-optimization program which includes automated exposure control, adjustment of the mA and/or kV according to patient size and/or use of iterative reconstruction technique. COMPARISON:  CT head 03/19/2024, MRI head 02/22/2024 FINDINGS: Brain: Patchy and confluent areas of decreased attenuation are noted throughout the deep and periventricular white matter of the cerebral hemispheres bilaterally, compatible with chronic microvascular ischemic disease. No evidence of large-territorial acute infarction. No parenchymal hemorrhage. Redemonstration of a left frontal lobe 2.5 cm mass with associated marked vasogenic edema leading to grossly stable 4 mm left-to-right midline shift. No extra-axial collection. No hydrocephalus. Basilar cisterns are patent. Vascular: No hyperdense vessel. Skull: No acute fracture or focal lesion.  Prior left craniotomy. Sinuses/Orbits: Paranasal sinuses and mastoid air cells are clear. The orbits are unremarkable. Other: None. 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 Electronically Signed   By: Morgane  Naveau M.D.   On: 03/28/2024 15:53    EKG: Independently reviewed. SVT  Assessment/Plan Principal Problem:  Seizure (HCC)   Brain metastasis   Aphasia -called Neuro dr.Lindzen, who recommended increasing Keppra  to 1gm BID, they will not follow for now -monitor, seizure precaution -Called and updated niece who strongly suspected poor compliance with meds as well - CT with 2.5 cm mass with associated vasogenic edema largely stable, restart Decadron  - She is followed by Dr. [name] and Dr. [name] but has not followed up in a while - Followed by palliative care as well - Called and updated niece, recommended palliative consult for goals of care, she might be more appropriate for home hospice, this was discussed with the  niece    SVT - replace K, check Mag  -Start Metoprolol   -check ECHo    Primary adenocarcinoma of upper lobe of right lung (HCC)   Metastasis to bone (HCC) - Has not had treatment for this in a very long time    COPD (chronic obstructive pulmonary disease) (HCC) -Stable    DVT prophylaxis: SCDs Code Status: Full code Family Communication: No family at bedside, called and updated niece Verneita Cleveland at [phone] Disposition Plan: TBD Consults called: palliative, will add Dr.Mohamed and Dr.Vaslow to Rx teams Admission status: inpatient,  Sigurd Pac MD [facility]   03/28/2024, 5:45 PM

## 2024-03-28 NOTE — ED Notes (Signed)
 Patient was seen having a seizure. RN and EDP made aware are now at bedside. Pharmacy is at bedside.

## 2024-03-28 NOTE — ED Provider Notes (Signed)
  Physical Exam  BP (!) 174/114 (BP Location: Right Arm)   Pulse (!) 111   Temp 98.6 F (37 C) (Rectal)   Resp (!) 28   Ht 5' 6 (1.676 m)   Wt 55 kg   LMP 06/06/2010   SpO2 98%   BMI 19.57 kg/m   Physical Exam  Procedures  Procedures  ED Course / MDM    Medical Decision Making Care assumed at 3 PM.  Patient is here with seizure and then developed SVT and required cardioversion.  Patient has known brain tumor and known seizure disorder on Keppra .  Signed out pending results of CT head and chest abdomen pelvis.  4:15 PM Patient is back to baseline.  Patient's heart rate is in the low 100s.  Previous provider started patient on Cardizem  drip to maintain her heart rate.  Patient also received 2 g of Keppra  bolus. Discussed with Dr. Lindzen from neurology. He recommend increase keppra  to 1 g BID.  I reviewed results of CT head and chest abdomen pelvis.  Patient has known left frontal lobe mass with vasogenic edema.  Patient CT chest abdomen pelvis did not show any significant changes.  At this point hospitalist to admit for seizure and SVT with persistent tachycardia.  Of note, patient supposed to have MRI brain with and without contrast scheduled by oncology today.  Problems Addressed: Seizure [facility]): acute illness or injury Tachycardia: acute illness or injury  Amount and/or Complexity of Data Reviewed Labs: ordered. Decision-making details documented in ED Course. Radiology: ordered and independent interpretation performed. Decision-making details documented in ED Course.  Risk Prescription drug management. Decision regarding hospitalization.          Patt Alm Macho, MD 03/28/24 [phone]

## 2024-03-29 ENCOUNTER — Encounter (HOSPITAL_COMMUNITY): Payer: Self-pay | Admitting: Internal Medicine

## 2024-03-29 ENCOUNTER — Other Ambulatory Visit: Payer: Self-pay | Admitting: Radiation Therapy

## 2024-03-29 ENCOUNTER — Inpatient Hospital Stay: Payer: MEDICAID | Admitting: Internal Medicine

## 2024-03-29 ENCOUNTER — Inpatient Hospital Stay (HOSPITAL_COMMUNITY): Payer: MEDICAID

## 2024-03-29 DIAGNOSIS — Z515 Encounter for palliative care: Secondary | ICD-10-CM

## 2024-03-29 DIAGNOSIS — C7931 Secondary malignant neoplasm of brain: Secondary | ICD-10-CM

## 2024-03-29 DIAGNOSIS — R4701 Aphasia: Secondary | ICD-10-CM

## 2024-03-29 DIAGNOSIS — C7951 Secondary malignant neoplasm of bone: Secondary | ICD-10-CM | POA: Diagnosis not present

## 2024-03-29 DIAGNOSIS — R569 Unspecified convulsions: Secondary | ICD-10-CM | POA: Diagnosis not present

## 2024-03-29 DIAGNOSIS — I509 Heart failure, unspecified: Secondary | ICD-10-CM | POA: Diagnosis not present

## 2024-03-29 DIAGNOSIS — Z7189 Other specified counseling: Secondary | ICD-10-CM

## 2024-03-29 DIAGNOSIS — C3490 Malignant neoplasm of unspecified part of unspecified bronchus or lung: Secondary | ICD-10-CM

## 2024-03-29 DIAGNOSIS — C3411 Malignant neoplasm of upper lobe, right bronchus or lung: Secondary | ICD-10-CM

## 2024-03-29 LAB — ECHOCARDIOGRAM COMPLETE
AR max vel: 2.39 cm2
AV Area VTI: 2.3 cm2
AV Area mean vel: 2.28 cm2
AV Mean grad: 3 mmHg
AV Peak grad: 5.6 mmHg
Ao pk vel: 1.18 m/s
Area-P 1/2: 5.66 cm2
Height: 66 in
S' Lateral: 1.5 cm
Weight: 1940.05 [oz_av]

## 2024-03-29 LAB — COMPREHENSIVE METABOLIC PANEL WITH GFR
ALT: 11 U/L (ref 0–44)
AST: 19 U/L (ref 15–41)
Albumin: 3.2 g/dL — ABNORMAL LOW (ref 3.5–5.0)
Alkaline Phosphatase: 167 U/L — ABNORMAL HIGH (ref 38–126)
Anion gap: 15 (ref 5–15)
BUN: 9 mg/dL (ref 6–20)
CO2: 22 mmol/L (ref 22–32)
Calcium: 9.6 mg/dL (ref 8.9–10.3)
Chloride: 95 mmol/L — ABNORMAL LOW (ref 98–111)
Creatinine, Ser: 0.57 mg/dL (ref 0.44–1.00)
GFR, Estimated: >60 mL/min (ref >60)
Glucose, Bld: 163 mg/dL — ABNORMAL HIGH (ref 70–99)
Potassium: 4.3 mmol/L (ref 3.5–5.1)
Sodium: 132 mmol/L — ABNORMAL LOW (ref 135–145)
Total Bilirubin: 0.8 mg/dL (ref 0.0–1.2)
Total Protein: 7.4 g/dL (ref 6.5–8.1)

## 2024-03-29 LAB — CBC WITH DIFFERENTIAL/PLATELET
Abs Immature Granulocytes: 0.4 K/uL — ABNORMAL HIGH (ref 0.00–0.07)
Basophils Absolute: 0.1 K/uL (ref 0.0–0.1)
Basophils Relative: 1 %
Eosinophils Absolute: 0 K/uL (ref 0.0–0.5)
Eosinophils Relative: 0 %
HCT: 44.4 % (ref 36.0–46.0)
Hemoglobin: 14.9 g/dL (ref 12.0–15.0)
Immature Granulocytes: 4 %
Lymphocytes Relative: 8 %
Lymphs Abs: 0.9 K/uL (ref 0.7–4.0)
MCH: 28.1 pg (ref 26.0–34.0)
MCHC: 33.6 g/dL (ref 30.0–36.0)
MCV: 83.6 fL (ref 80.0–100.0)
Monocytes Absolute: 0.8 K/uL (ref 0.1–1.0)
Monocytes Relative: 7 %
Neutro Abs: 9.2 K/uL — ABNORMAL HIGH (ref 1.7–7.7)
Neutrophils Relative %: 80 %
Platelets: 671 K/uL — ABNORMAL HIGH (ref 150–400)
RBC: 5.31 MIL/uL — ABNORMAL HIGH (ref 3.87–5.11)
RDW: 17.2 % — ABNORMAL HIGH (ref 11.5–15.5)
WBC: 11.4 K/uL — ABNORMAL HIGH (ref 4.0–10.5)
nRBC: 0 % (ref 0.0–0.2)

## 2024-03-29 LAB — MAGNESIUM
Magnesium: 2 mg/dL (ref 1.7–2.4)
Magnesium: 1.9 mg/dL (ref 1.7–2.4)

## 2024-03-29 LAB — TSH: TSH: 0.557 u[IU]/mL (ref 0.350–4.500)

## 2024-03-29 LAB — PHOSPHORUS: Phosphorus: 4.1 mg/dL (ref 2.5–4.6)

## 2024-03-29 MED ORDER — LEVETIRACETAM (KEPPRA) 500 MG/5 ML ADULT IV PUSH
1000.0000 mg | Freq: Two times a day (BID) | INTRAVENOUS | Status: DC
  Administered 2024-03-29 – 2024-03-30 (×4): 1000 mg via INTRAVENOUS
  Filled 2024-03-29 (×4): qty 10

## 2024-03-29 MED ORDER — OXYCODONE HCL 5 MG PO TABS
10.0000 mg | ORAL_TABLET | ORAL | Status: DC | PRN
  Administered 2024-03-29 – 2024-04-04 (×12): 10 mg via ORAL
  Filled 2024-03-29 (×12): qty 2

## 2024-03-29 MED ORDER — METOPROLOL TARTRATE 25 MG PO TABS
25.0000 mg | ORAL_TABLET | Freq: Two times a day (BID) | ORAL | Status: DC
  Administered 2024-03-29 – 2024-03-30 (×4): 25 mg via ORAL
  Filled 2024-03-29 (×4): qty 1

## 2024-03-29 NOTE — Progress Notes (Signed)
*  PRELIMINARY RESULTS* Echocardiogram 2D Echocardiogram has been performed.  Regina [name] Stallion 03/29/2024, 9:38 AM

## 2024-03-29 NOTE — Progress Notes (Signed)
Patient in red mews at start of shift

## 2024-03-29 NOTE — TOC Initial Note (Signed)
 Transition of Care [facility]) - Initial/Assessment Note    Patient Details  Name: Regina [name] MRN: [id number] Date of Birth: 08/13/1966  Transition of Care [facility]) CM/SW Contact:    Regina GORMAN Kindle, LCSW Phone Number: 03/29/2024, 3:32 PM  Clinical Narrative:                 CSW spoke with patient's niece, Regina [name], and provided contact info for [facility] and DSS to assist her in placing patient's brother with Autism. She does not know any of his information so CSW explained potential barriers without having Guardianship. CSW explained Guardianship process as well.      Barriers to Discharge: Continued Medical Work up   Patient Goals and CMS Choice Patient states their goals for this hospitalization and ongoing recovery are:: Comfort CMS Medicare.gov Compare Post Acute Care list provided to:: Patient Represenative (must comment) Choice offered to / list presented to :  (Niece)      Expected Discharge Plan and Services In-house Referral: Clinical Social Work, Hospice / Palliative Care     Living arrangements for the past 2 months: Apartment                                      Prior Living Arrangements/Services Living arrangements for the past 2 months: Apartment Lives with:: Siblings, Adult Children Patient language and need for interpreter reviewed:: Yes Do you feel safe going back to the place where you live?: Yes      Need for Family Participation in Patient Care: Yes (Comment) Care giver support system in place?: Yes (comment) Current home services: DME (walker) Criminal Activity/Legal Involvement Pertinent to Current Situation/Hospitalization: No - Comment as needed  Activities of Daily Living   ADL Screening (condition at time of admission) Independently performs ADLs?: Yes (appropriate for developmental age) Is the patient deaf or have difficulty hearing?: No Does the patient have difficulty seeing, even when wearing glasses/contacts?: No Does the patient have  difficulty concentrating, remembering, or making decisions?: Yes  Permission Sought/Granted Permission sought to share information with : Facility Medical sales representative, Family Supports Permission granted to share information with : No        Permission granted to share info w Relationship: Walker Cleveland (niece) [phone]     Emotional Assessment Appearance:: Appears stated age Attitude/Demeanor/Rapport: Unable to Assess Affect (typically observed): Unable to Assess Orientation: : Oriented to Self Alcohol  / Substance Use: Not Applicable Psych Involvement: No (comment)  Admission diagnosis:  Seizure (HCC) [R56.9] Tachycardia [R00.0] Patient Active Problem List   Diagnosis Date Noted   SVT (supraventricular tachycardia) (HCC) 03/28/2024   Seizure (HCC) 03/28/2024   Shortness of breath 03/09/2024   Bilateral leg edema 03/08/2024   Aphasia 02/22/2024   Hypertension 02/13/2024   Encounter for antineoplastic immunotherapy 02/13/2024   Left leg weakness 02/12/2024   Dysarthria 02/12/2024   Arm pain, diffuse, right- due to metastatic lung cancer 02/06/2024   Cancer-related breakthrough pain 12/19/2023   High risk medication use 12/19/2023   Non-small cell lung cancer (HCC) 12/19/2023   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   Medication management 12/19/2023   Cancer associated pain 12/19/2023   Acute metabolic encephalopathy 12/13/2023   Expressive aphasia 12/13/2023   SIRS (systemic inflammatory response syndrome) (HCC) 12/13/2023   Lactic acidosis 12/13/2023   History of COVID-19 12/13/2023   Metabolic acidosis with increased anion gap and accumulation of organic acids 12/13/2023   Pathologic  fracture 11/22/2023   Hypokalemia 11/22/2023   Closed fracture of shaft of right humerus 10/10/2023   Pressure injury of skin 10/10/2023   Cerebral intracranial abscess 10/07/2023   Palliative care encounter 10/04/2023   Pathological fracture of right humerus with delayed  healing 10/04/2023   Lung cancer metastatic to brain (HCC) 10/04/2023   Vasogenic brain edema (HCC) 10/04/2023   Counseling and coordination of care 10/04/2023   Protein-calorie malnutrition, severe 10/04/2023   Metastasis to bone (HCC) 10/03/2023   Cerebral edema (HCC) 10/01/2023   Primary adenocarcinoma of upper lobe of right lung (HCC) 09/22/2023   Pathological fracture in neoplastic disease, right humerus, initial encounter for fracture 09/19/2023   Neoplasm causing mass effect and brain compression on adjacent structures (HCC) 09/18/2023   COPD (chronic obstructive pulmonary disease) (HCC)    Anxiety    Hyponatremia 04/16/2018   Depression 04/16/2018   Hypertensive urgency    HTN (hypertension) 01/13/2016   Right anterior shoulder pain 07/18/2014   Postconcussion syndrome 05/28/2014   PCP:  Regina Rosaline SQUIBB, NP Pharmacy:   [facility] [phone] - [place], [place] - [address] AT [facility] [address] [name] [address] Phone: [phone] Fax: [phone]  [facility] [address], [address] [place] [address] Phone: [phone] Fax: [phone]  [place] [name] - [facility] [address] [place] [place] [address] Phone: [phone] Fax: [phone]     Social Drivers of Health (SDOH) Social History: SDOH Screenings   Food Insecurity: No Food Insecurity (03/29/2024)  Recent Concern: Food Insecurity - Food Insecurity Present (01/16/2024)  Housing: Low Risk  (03/29/2024)  Recent Concern: Housing - High Risk (03/09/2024)  Transportation Needs: No Transportation Needs (03/29/2024)  Utilities: Not At Risk (03/29/2024)  Recent Concern: Utilities - At Risk (03/09/2024)  Depression (PHQ2-9): Low Risk  (01/16/2024)  Social Connections: Unknown (03/29/2024)  Tobacco Use: Medium Risk (03/12/2024)   SDOH Interventions:     Readmission Risk Interventions     03/29/2024    3:29 PM 02/07/2024    3:53 PM 11/24/2023    4:23 PM  Readmission Risk Prevention Plan  Transportation Screening Complete Complete Complete  Medication Review Oceanographer) Complete Referral to Pharmacy Complete  PCP or Specialist appointment within 3-5 days of discharge Complete Complete Complete  HRI or Home Care Consult Complete Complete Complete  SW Recovery Care/Counseling Consult Complete Complete Complete  Palliative Care Screening Complete Complete Complete  Skilled Nursing Facility Not Applicable Not Applicable Not Applicable

## 2024-03-29 NOTE — Evaluation (Signed)
 Clinical/Bedside Swallow Evaluation Patient Details  Name: Regina [name] MRN: [id number] Date of Birth: [birth year]  Today's Date: 03/29/2024 Time: SLP Start Time (ACUTE ONLY): 9048 SLP Stop Time (ACUTE ONLY): 1006 SLP Time Calculation (min) (ACUTE ONLY): 15 min  Past Medical History:  Past Medical History:  Diagnosis Date   Allergy    Anxiety    Brain mass    Cancer (HCC)    COPD (chronic obstructive pulmonary disease) (HCC)    Emphysema of lung (HCC)    Hypertension    Past Surgical History:  Past Surgical History:  Procedure Laterality Date   APPLICATION OF CRANIAL NAVIGATION Left 10/07/2023   Procedure: APPLICATION OF CRANIAL NAVIGATION;  Surgeon: Gillie Duncans, MD;  Location: [facility] OR;  Service: Neurosurgery;  Laterality: Left;   CRANIOTOMY Left 10/07/2023   Procedure: FRONTAL CRANIOTOMY FOR ABSCESS;  Surgeon: Gillie Duncans, MD;  Location: [facility] OR;  Service: Neurosurgery;  Laterality: Left;   dental procedure     HUMERUS IM NAIL Right 10/10/2023   Procedure: INTRAMEDULLARY (IM) NAIL HUMERAL RIGHT;  Surgeon: Genelle Standing, MD;  Location: [facility] OR;  Service: Orthopedics;  Laterality: Right;   HPI:  Pt is a 58 yo female presenting 7/23 with AMS and weakness. She had tonic-clonic seizure in the ED. Pt had two recent hospitalizations earlier in July [facility] for fluid overload/CHF exacerbation; outside hospital with generalized body pains, dyspnea,  encephalopathy, and had clinical swallow eval this admission that was [facility]). 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 She is missing dentition and seems to indicate that she typically wears dentures. Swallowing appears to be functional with purees and thin liquids but pt is hesitant to eat solids offered. When she did, she took very tiny pieces at a time and mastication was prolonged. Question if she typically wears dentures for intake. Recommend that she initiate Dys 3 (mechanical soft) diet and thin liquids. SLP will f/u acutely but do not anticipate that she will need ongoing f/u for dysphagia post-discharge.   SLP Visit Diagnosis: Dysphagia, unspecified (R13.10)    Aspiration Risk       Diet Recommendation Dysphagia 3 (Mech soft);Thin liquid    Liquid Administration via: Cup;Straw Medication Administration: Whole meds with liquid Supervision: Staff to assist with self feeding Compensations: Slow rate;Small sips/bites Postural Changes: Seated upright at 90 degrees    Other  Recommendations Oral Care Recommendations: Oral care BID     Assistance Recommended at Discharge    Functional Status Assessment Patient has had a recent decline in their functional status and demonstrates the ability to make significant improvements in function in a reasonable and predictable amount of time.  Frequency and Duration min 2x/week  2 weeks       Prognosis Prognosis for improved oropharyngeal function: Good Barriers to Reach Goals: Language deficits;Cognitive deficits      Swallow Study   General HPI: Pt is a 57 yo female presenting 7/23 with AMS and weakness. She had tonic-clonic seizure in the ED. Pt had two recent hospitalizations earlier in July [facility] for fluid overload/CHF exacerbation; outside hospital  with generalized body pains, dyspnea,  encephalopathy, and had clinical swallow eval this admission that was [facility]). PMH includes:  metastatic non-small cell lung cancer, bone and brain metastasis, s/p brain stereotactic radiosurgery [place]), craniotomy in 1/25 (residual aphasia at  baseline) and radiation therapy for a pathologic fracture of the humerus and calvarial metastases, COPD, hypertension, anxiety Type of Study: Bedside Swallow Evaluation Previous Swallow Assessment: see HPI Diet Prior to this Study: NPO Temperature Spikes Noted: No Respiratory Status: Nasal cannula History of Recent Intubation: No Behavior/Cognition: Alert;Cooperative;Pleasant mood;Other (Comment) (aphasia) Oral Cavity Assessment: Within Functional Limits Oral Care Completed by SLP: No Oral Cavity - Dentition: Missing dentition Vision: Functional for self-feeding Self-Feeding Abilities: Able to feed self;Needs set up Patient Positioning: Upright in bed Baseline Vocal Quality: Normal Volitional Cough: Weak Volitional Swallow: Able to elicit (with cues)    Oral/Motor/Sensory Function Overall Oral Motor/Sensory Function:  (inconsistent command following, ? mild R facial weakness)   Ice Chips Ice chips: Not tested   Thin Liquid Thin Liquid: Within functional limits Presentation: Cup;Self Fed;Straw    Nectar Thick Nectar Thick Liquid: Not tested   Honey Thick Honey Thick Liquid: Not tested   Puree Puree: Within functional limits Presentation: Self Fed;Spoon   Solid     Solid: Impaired Presentation: Self Fed Oral Phase Impairments: Impaired mastication      Leita SAILOR., M.A. CCC-SLP Acute Rehabilitation Services Office: [phone]  Secure chat preferred  03/29/2024,10:15 AM

## 2024-03-29 NOTE — Progress Notes (Signed)
 Regina [name]   DOB:Jan 08, 1966   FM#:[id number]      ASSESSMENT & PLAN:  Regina [name] is a 58 year old female patient with medical history significant for NSCLC with extensive bilateral hilar, mediastinal, right supraclavicular lymphadenopathy and mets to right arm and brain mets. Patient has missed several outpatient appointments in the past.  Patient with multiple ED visits and admissions noted. Recently admitted [facility] 03/19/24.  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  Navigator/staff unable to contact.  -- First dose Pembrolizumab  given 02/13/24.     -- No chemo planned during this admission. -- continue judicious pain management -- Status post palliative RT to humeral head x 2 doses, however patient lost to follow up in March 2025. Then had palliative RT to left sacrum x 5 doses in April 2025.   -- Recommend Palliative, patient with poor prognosis.  Hospice recommended.   -- Medical Oncology/Dr. [name] following.   Leukocytosis -- WBC 11.4 -- May be steroid induced -- Afebrile, monitor fever curve -- Continue to monitor CBC with diff  Thrombocytosis -- platelets elevated 671K -- Likely reactive -- Continue to monitor CBC with diff    Code Status DNR-Limited  Subjective:  Patient seen laying in bed resting comfortably, lethargic and mumbling when awakens. Speech is not coherent.  Nods her head when asked if she's in pain.  No shortness of breath noted.  Chronically ill-appearing.    Objective:   Intake/Output Summary (Last 24 hours) at 03/29/2024 1327 Last data filed at 03/28/2024 2106 Gross per 24 hour  Intake 35.65 ml  Output --  Net 35.65 ml     PHYSICAL EXAMINATION: ECOG PERFORMANCE STATUS: 4 - Bedbound  Vitals:   03/29/24 1000 03/29/24 1130  BP:  (!) 140/98  Pulse:  99  Resp: (!) 21   Temp:  98.4 F (36.9 C)  SpO2:     Filed Weights   03/28/24 1411  Weight: 121 lb 4.1 oz (55 kg)    GENERAL: +chronically ill-appearing +lethargic +cachectic SKIN: skin color, texture, turgor are normal, no rashes or significant lesions EYES: normal, conjunctiva are pink and non-injected, sclera clear OROPHARYNX: no exudate, no erythema and lips, buccal mucosa, and tongue normal  NECK: supple, thyroid  normal size, non-tender, without nodularity LYMPH: no palpable lymphadenopathy in the cervical, axillary or inguinal LUNGS: clear to auscultation and percussion with normal breathing effort HEART: regular rate & rhythm and no murmurs and no lower extremity edema ABDOMEN: abdomen soft, non-tender and normal bowel sounds MUSCULOSKELETAL: no cyanosis of digits and no clubbing  PSYCH: +lethargic    All questions were answered. The patient knows to call the clinic with any problems, questions or concerns.   The total time spent in the appointment was 40 minutes encounter with patient including review of chart and various tests results, discussions about plan of care and coordination of care plan  Olam JINNY Brunner, NP 03/29/2024 1:27 PM    Labs Reviewed:  Lab Results  Component Value Date   WBC 11.4 (H)  03/29/2024   HGB 14.9 03/29/2024   HCT 44.4 03/29/2024   MCV 83.6 03/29/2024   PLT 671 (H) 03/29/2024   Recent Labs    09/18/23 0620 09/19/23 0405 03/12/24 1015 03/28/24 1414 03/28/24 1445 03/29/24 1103  NA 135   < > 138 134* 134* 132*  K 4.3   < > 3.5 3.2* 3.1* 4.3  CL 103   < > 100 96* 97* 95*  CO2 19*   < > 21* 20*  --  22   GLUCOSE 116*   < > 74 130* 128* 163*  BUN 8   < > 6 <5* <3* 9  CREATININE 0.71   < > 0.66 0.59 0.40* 0.57  CALCIUM  9.2   < > 8.9 9.0  --  9.6  GFRNONAA >60   < > >60 >60  --  >60  PROT 7.2   < > 6.1* 6.9  --  7.4  ALBUMIN 3.6   < > 3.2* 3.0*  --  3.2*  AST 23   < > 25 24  --  19  ALT 13   < > 11 9  --  11  ALKPHOS 98   < > 175* 168*  --  167*  BILITOT 0.8   < > 0.8 0.9  --  0.8  BILIDIR <0.1  --   --   --   --   --   IBILI NOT CALCULATED  --   --   --   --   --    < > = values in this interval not displayed.    Studies Reviewed:  ECHOCARDIOGRAM COMPLETE Result Date: 03/29/2024    ECHOCARDIOGRAM REPORT   Patient Name:   Regina [name] Date of Exam: 03/29/2024 Medical Rec #:  [id number]      Height:       66.0 in Accession #:    [id number]     Weight:       121.3 lb Date of Birth:  1965-10-27      BSA:          1.617 m Patient Age:    58 years       BP:           159/97 mmHg Patient Gender: F              HR:           106 bpm. Exam Location:  Inpatient Procedure: 2D Echo, Color Doppler and Cardiac Doppler (Both Spectral and Color            Flow Doppler were utilized during procedure). Indications:    CHF  History:        Patient has no prior history of Echocardiogram examinations.  Sonographer:    Benard Stallion Referring Phys: [phone] PREETHA JOSEPH IMPRESSIONS  1. Mild intercavitary gradient. Left ventricular ejection fraction, by estimation, is 70 to 75%. The left ventricle has hyperdynamic function. The left ventricle has no regional wall motion abnormalities. There is mild concentric left ventricular hypertrophy. Left ventricular diastolic parameters are indeterminate.  2. Right ventricular systolic function is mildly reduced. The right ventricular size is normal.  3. The mitral valve is normal in structure. No evidence of mitral valve regurgitation. No evidence of mitral stenosis.  4. The aortic valve is normal in structure. Aortic valve regurgitation is not visualized. No aortic stenosis is  present. FINDINGS  Left Ventricle: Mild intercavitary gradient. Left ventricular ejection fraction, by estimation, is 70 to 75%.  The left ventricle has hyperdynamic function. The left ventricle has no regional wall motion abnormalities. The left ventricular internal cavity size was normal in size. There is mild concentric left ventricular hypertrophy. Left ventricular diastolic parameters are indeterminate. Right Ventricle: The right ventricular size is normal. No increase in right ventricular wall thickness. Right ventricular systolic function is mildly reduced. Left [facility]: Left atrial size was normal in size. Right [facility]: Right atrial size was normal in size. Pericardium: There is no evidence of pericardial effusion. Mitral Valve: The mitral valve is normal in structure. No evidence of mitral valve regurgitation. No evidence of mitral valve stenosis. Tricuspid Valve: The tricuspid valve is normal in structure. Tricuspid valve regurgitation is mild . No evidence of tricuspid stenosis. Aortic Valve: The aortic valve is normal in structure. Aortic valve regurgitation is not visualized. No aortic stenosis is present. Aortic valve mean gradient measures 3.0 mmHg. Aortic valve peak gradient measures 5.6 mmHg. Aortic valve area, by VTI measures 2.30 cm. Pulmonic Valve: The pulmonic valve was normal in structure. Pulmonic valve regurgitation is not visualized. No evidence of pulmonic stenosis. Aorta: The aortic root is normal in size and structure. Venous: The inferior vena cava was not well visualized. IAS/Shunts: The interatrial septum was not assessed.  LEFT VENTRICLE PLAX 2D LVIDd:         2.60 cm   Diastology LVIDs:         1.50 cm   LV e' medial:    3.59 cm/s LV PW:         1.10 cm   LV E/e' medial:  19.4 LV IVS:        1.20 cm   LV e' lateral:   8.59 cm/s LVOT diam:     1.90 cm   LV E/e' lateral: 8.1 LV SV:         39 LV SV Index:   24 LVOT Area:     2.84 cm  RIGHT VENTRICLE RV Basal diam:  2.80 cm RV Mid diam:     2.70 cm RV S prime:     5.98 cm/s TAPSE (M-mode): 1.9 cm LEFT ATRIUM           Index        RIGHT ATRIUM          Index LA diam:      1.70 cm 1.05 cm/m   RA Area:     8.49 cm LA Vol (A2C): 31.0 ml 19.18 ml/m  RA Volume:   15.90 ml 9.84 ml/m LA Vol (A4C): 13.0 ml 8.04 ml/m  AORTIC VALVE AV Area (Vmax):    2.39 cm AV Area (Vmean):   2.28 cm AV Area (VTI):     2.30 cm AV Vmax:           118.00 cm/s AV Vmean:          80.100 cm/s AV VTI:            0.172 m AV Peak Grad:      5.6 mmHg AV Mean Grad:      3.0 mmHg LVOT Vmax:         99.40 cm/s LVOT Vmean:        64.300 cm/s LVOT VTI:          0.139 m LVOT/AV VTI ratio: 0.81  AORTA Ao Root diam: 2.50 cm Ao Asc diam:  3.00 cm MITRAL VALVE               TRICUSPID VALVE  MV Area (PHT): 5.66 cm    TR Peak grad:   41.7 mmHg MV Decel Time: 134 msec    TR Vmax:        323.00 cm/s MV E velocity: 69.70 cm/s MV A velocity: 91.30 cm/s  SHUNTS MV E/A ratio:  0.76        Systemic VTI:  0.14 m                            Systemic Diam: 1.90 cm Morene Brownie Electronically signed by Morene Brownie Signature Date/Time: 03/29/2024/12:45:53 PM    Final    DG Chest Portable 1 View Result Date: 03/28/2024 CLINICAL DATA:  Chest pain EXAM: PORTABLE CHEST 1 VIEW COMPARISON:  Chest x-ray performed March 12, 2024 FINDINGS: Numerous leads overlie the patient. Mild interstitial prominence, similar. No significant pleural effusion or pneumothorax. IMPRESSION: 1. Chronic appearing interstitial changes. 2. No pleural effusion or pneumothorax. Electronically Signed   By: Maude Naegeli M.D.   On: 03/28/2024 16:01   CT Angio Chest PE W and/or Wo Contrast Result Date: 03/28/2024 CLINICAL DATA:  Concern for pulmonary edema.  Also sepsis. EXAM: CT ANGIOGRAPHY CHEST CT ABDOMEN AND PELVIS WITH CONTRAST TECHNIQUE: Multidetector CT imaging of the chest was performed using the standard protocol during bolus administration of intravenous contrast. Multiplanar CT image reconstructions and MIPs were  obtained to evaluate the vascular anatomy. Multidetector CT imaging of the abdomen and pelvis was performed using the standard protocol during bolus administration of intravenous contrast. RADIATION DOSE REDUCTION: This exam was performed according to the departmental dose-optimization program which includes automated exposure control, adjustment of the mA and/or kV according to patient size and/or use of iterative reconstruction technique. CONTRAST:  60mL OMNIPAQUE  IOHEXOL  350 MG/ML SOLN COMPARISON:  Chest radiograph dated 03/28/2024 and CT dated 03/23/2024. FINDINGS: Evaluation of this exam is limited due to respiratory motion and streak artifact caused by patient's arms. CTA CHEST FINDINGS Cardiovascular: There is no cardiomegaly or pericardial effusion. Mild atherosclerotic calcification of the thoracic aorta. No aneurysmal dilatation or dissection. No pulmonary artery embolus identified. Mediastinum/Nodes: There is bilateral hilar and mediastinal adenopathy measuring 12 mm in short axis on the right. Subcarinal lymph node measures approximately 2.2 cm. Overall similar or slightly improved adenopathy compared to prior CT. The esophagus is grossly unremarkable. No mediastinal fluid collection. Lungs/Pleura: Background of emphysema on biapical subpleural scarring. Interval decrease in the size of the right apical subpleural nodule. A 1.8 cm subpleural bandlike density in the right apex (22/9) likely residual infiltrate or scarring. Continued follow-up recommended. No new nodule. No consolidative changes. There is no pleural effusion or pneumothorax. The central airways are patent. Musculoskeletal: No acute osseous pathology. Right humeral intramedullary nail. Review of the MIP images confirms the above findings. CT ABDOMEN and PELVIS FINDINGS No intra-abdominal free air or free fluid. Hepatobiliary: The liver is unremarkable. No biliary dilatation. The gallbladder is unremarkable Pancreas: Unremarkable. No  pancreatic ductal dilatation or surrounding inflammatory changes. Spleen: Normal in size without focal abnormality. Adrenals/Urinary Tract: The adrenal glands unremarkable. There is no hydronephrosis on either side. There is symmetric enhancement and excretion of contrast by both kidneys. The visualized ureters and urinary bladder appear unremarkable. Stomach/Bowel: There is no bowel obstruction. Mild diffuse thickened appearance of the colon may be related to underdistention or represent colitis. Clinical correlation is recommended. Several small scattered colonic diverticula. The appendix is normal. Vascular/Lymphatic: Mild aortoiliac atherosclerotic disease. The IVC is unremarkable no portal  venous gas. There is no adenopathy. Reproductive: Several calcified uterine fibroids. No suspicious adnexal masses. Other: None Musculoskeletal: Osteopenia with degenerative changes. L5 compression fracture with approximately 40-50% loss of vertebral body height, likely acute or subacute. Clinical correlation recommended. Review of the MIP images confirms the above findings. IMPRESSION: 1. No CT evidence of pulmonary artery embolus. 2. Interval decrease in the size of the right apical subpleural nodule. A 1.8 cm subpleural bandlike density in the right apex likely residual infiltrate or scarring. Continued follow-up recommended. 3. Distension of the colon versus colitis.  No bowel obstruction. 4. Mild colonic diverticulosis. 5. L5 compression fracture, likely acute or subacute. 6. Aortic Atherosclerosis (ICD10-I70.0) and Emphysema (ICD10-J43.9). Electronically Signed   By: Vanetta Chou M.D.   On: 03/28/2024 16:00   CT ABDOMEN PELVIS W CONTRAST Result Date: 03/28/2024 CLINICAL DATA:  Concern for pulmonary edema.  Also sepsis. EXAM: CT ANGIOGRAPHY CHEST CT ABDOMEN AND PELVIS WITH CONTRAST TECHNIQUE: Multidetector CT imaging of the chest was performed using the standard protocol during bolus administration of intravenous  contrast. Multiplanar CT image reconstructions and MIPs were obtained to evaluate the vascular anatomy. Multidetector CT imaging of the abdomen and pelvis was performed using the standard protocol during bolus administration of intravenous contrast. RADIATION DOSE REDUCTION: This exam was performed according to the departmental dose-optimization program which includes automated exposure control, adjustment of the mA and/or kV according to patient size and/or use of iterative reconstruction technique. CONTRAST:  60mL OMNIPAQUE  IOHEXOL  350 MG/ML SOLN COMPARISON:  Chest radiograph dated 03/28/2024 and CT dated 03/23/2024. FINDINGS: Evaluation of this exam is limited due to respiratory motion and streak artifact caused by patient's arms. CTA CHEST FINDINGS Cardiovascular: There is no cardiomegaly or pericardial effusion. Mild atherosclerotic calcification of the thoracic aorta. No aneurysmal dilatation or dissection. No pulmonary artery embolus identified. Mediastinum/Nodes: There is bilateral hilar and mediastinal adenopathy measuring 12 mm in short axis on the right. Subcarinal lymph node measures approximately 2.2 cm. Overall similar or slightly improved adenopathy compared to prior CT. The esophagus is grossly unremarkable. No mediastinal fluid collection. Lungs/Pleura: Background of emphysema on biapical subpleural scarring. Interval decrease in the size of the right apical subpleural nodule. A 1.8 cm subpleural bandlike density in the right apex (22/9) likely residual infiltrate or scarring. Continued follow-up recommended. No new nodule. No consolidative changes. There is no pleural effusion or pneumothorax. The central airways are patent. Musculoskeletal: No acute osseous pathology. Right humeral intramedullary nail. Review of the MIP images confirms the above findings. CT ABDOMEN and PELVIS FINDINGS No intra-abdominal free air or free fluid. Hepatobiliary: The liver is unremarkable. No biliary dilatation. The  gallbladder is unremarkable Pancreas: Unremarkable. No pancreatic ductal dilatation or surrounding inflammatory changes. Spleen: Normal in size without focal abnormality. Adrenals/Urinary Tract: The adrenal glands unremarkable. There is no hydronephrosis on either side. There is symmetric enhancement and excretion of contrast by both kidneys. The visualized ureters and urinary bladder appear unremarkable. Stomach/Bowel: There is no bowel obstruction. Mild diffuse thickened appearance of the colon may be related to underdistention or represent colitis. Clinical correlation is recommended. Several small scattered colonic diverticula. The appendix is normal. Vascular/Lymphatic: Mild aortoiliac atherosclerotic disease. The IVC is unremarkable no portal venous gas. There is no adenopathy. Reproductive: Several calcified uterine fibroids. No suspicious adnexal masses. Other: None Musculoskeletal: Osteopenia with degenerative changes. L5 compression fracture with approximately 40-50% loss of vertebral body height, likely acute or subacute. Clinical correlation recommended. Review of the MIP images confirms the above findings. IMPRESSION: 1.  No CT evidence of pulmonary artery embolus. 2. Interval decrease in the size of the right apical subpleural nodule. A 1.8 cm subpleural bandlike density in the right apex likely residual infiltrate or scarring. Continued follow-up recommended. 3. Distension of the colon versus colitis.  No bowel obstruction. 4. Mild colonic diverticulosis. 5. L5 compression fracture, likely acute or subacute. 6. Aortic Atherosclerosis (ICD10-I70.0) and Emphysema (ICD10-J43.9). Electronically Signed   By: Vanetta Chou M.D.   On: 03/28/2024 16:00   CT Head Wo Contrast Result Date: 03/28/2024 CLINICAL DATA:  Neuro deficit, acute, stroke suspected EXAM: CT HEAD WITHOUT CONTRAST TECHNIQUE: Contiguous axial images were obtained from the base of the skull through the vertex without intravenous contrast.  RADIATION DOSE REDUCTION: This exam was performed according to the departmental dose-optimization program which includes automated exposure control, adjustment of the mA and/or kV according to patient size and/or use of iterative reconstruction technique. COMPARISON:  CT head 03/19/2024, MRI head 02/22/2024 FINDINGS: Brain: Patchy and confluent areas of decreased attenuation are noted throughout the deep and periventricular white matter of the cerebral hemispheres bilaterally, compatible with chronic microvascular ischemic disease. No evidence of large-territorial acute infarction. No parenchymal hemorrhage. Redemonstration of a left frontal lobe 2.5 cm mass with associated marked vasogenic edema leading to grossly stable 4 mm left-to-right midline shift. No extra-axial collection. No hydrocephalus. Basilar cisterns are patent. Vascular: No hyperdense vessel. Skull: No acute fracture or focal lesion.  Prior left craniotomy. Sinuses/Orbits: Paranasal sinuses and mastoid air cells are clear. The orbits are unremarkable. Other: None. 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 Electronically Signed   By: Morgane  Naveau M.D.   On: 03/28/2024 15:53   DG Foot 2 Views Left Result Date: 03/12/2024 CLINICAL DATA:  Left lower extremity pain. Osseous metastatic disease. EXAM: LEFT FOOT - 2 VIEW; LEFT TIBIA AND FIBULA - 2 VIEW COMPARISON:  None Available. FINDINGS: No acute fracture or dislocation. The bones are osteopenic. Mild arthritic changes of the left knee. No discrete lesions. Mild soft tissue swelling of the dorsum of the foot. No radiopaque foreign object or soft tissue gas. IMPRESSION: 1. No acute fracture or dislocation. 2. Osteopenia. Electronically Signed   By: Vanetta Chou M.D.   On: 03/12/2024 10:16   DG Tibia/Fibula Left Result Date: 03/12/2024 CLINICAL DATA:  Left lower extremity pain. Osseous metastatic  disease. EXAM: LEFT FOOT - 2 VIEW; LEFT TIBIA AND FIBULA - 2 VIEW COMPARISON:  None Available. FINDINGS: No acute fracture or dislocation. The bones are osteopenic. Mild arthritic changes of the left knee. No discrete lesions. Mild soft tissue swelling of the dorsum of the foot. No radiopaque foreign object or soft tissue gas. IMPRESSION: 1. No acute fracture or dislocation. 2. Osteopenia. Electronically Signed   By: Vanetta Chou M.D.   On: 03/12/2024 10:16   DG Chest Port 1 View Result Date: 03/12/2024 CLINICAL DATA:  lung cancer. EXAM: PORTABLE CHEST 1 VIEW COMPARISON:  03/08/2024 FINDINGS: Redemonstration of nonspecific ill-defined opacity overlying the right lung apex, laterally, which corresponds to opacity seen on the chest CT scan from 02/22/2024. There also diffuse increased interstitial markings throughout bilateral lungs with lower lobe predominance and lower lobe honeycombing. Findings favor chronic interstitial lung disease, better evaluated on the recent chest CT scan. No acute consolidation or lung collapse. Bilateral costophrenic angles are clear. Stable cardio-mediastinal silhouette. No acute osseous abnormalities. The soft tissues are within normal limits. IMPRESSION: No acute  cardiopulmonary abnormality. Redemonstration of chronic interstitial lung disease. Electronically Signed   By: Ree Molt M.D.   On: 03/12/2024 08:08   DG Shoulder Right Result Date: 03/12/2024 CLINICAL DATA:  Right arm pain. Status post ORIF for pathologic fracture from metastatic lung cancer. EXAM: RIGHT SHOULDER - 2+ VIEW COMPARISON:  Right humerus films from 02/06/2024 FINDINGS: Bones are diffusely demineralized. No evidence for shoulder separation or dislocation. Intramedullary nail with 2 proximal interlocking screws in the humerus has been incompletely visualized. Fracture of the mid humeral diaphysis not well evaluated. IMPRESSION: 1. No acute bony findings. 2. Incomplete visualization of the right humerus  status post ORIF for pathologic fracture. Electronically Signed   By: Camellia Candle M.D.   On: 03/12/2024 06:47   DG Chest 2 View Result Date: 03/08/2024 CLINICAL DATA:  Shortness of breath EXAM: CHEST - 2 VIEW COMPARISON:  Chest radiograph 02/21/2024, CTA chest 02/22/2024. FINDINGS: Lungs are well inflated. Scarring in the lung apices and lung bases. Coarsened interstitial markings compatible with history of emphysema. Prominence of the central pulmonary vasculature with mild pulmonary edema noted. No pleural effusion or pneumothorax. Cardiomediastinal silhouette is similar to prior. Visualized osseous structures are unremarkable. Limited evaluation of the thoracic spine on lateral image. IMPRESSION: Mild pulmonary edema. Underlying emphysema and pulmonary scarring/fibrosis. Electronically Signed   By: Donnice Mania M.D.   On: 03/08/2024 14:05

## 2024-03-29 NOTE — Evaluation (Signed)
 Occupational Therapy Evaluation Patient Details Name: Regina [name] MRN: [id number] DOB: 1966/08/07 Today's Date: 03/29/2024   History of Present Illness   Pt is a 26 female presenting to [facility] 03/28/24 with weakness, had a tonic-clonic seizure while in ED. Pt with prior hospitalizations subsequently admitted 7/14-7/22, with generalized body pains, dyspnea, encephalopathy. PMH of brain metastasis, cancer, COPD, emphysema, HTN,  hx of craniotomy     Clinical Impressions Pt admitted for above, PTA pt lived with her family who would be with her 24/7 and assist with iADLs, pt ind with her ADLs and ambulates mod I with RW. Pt currently presenting as generally weak with low activity tolerance, reporting increased pain shooting down LLE. Pt does have hx of L5 compression fx. Her RUE is pain limited with decreased ROM. Pt currently needing min A to setup A for ADLs. Ambulates with CGA + RW short room level distance but c/o dizziness despite VSS.      If plan is discharge home, recommend the following:   A little help with bathing/dressing/bathroom;Supervision due to cognitive status     Functional Status Assessment   Patient has had a recent decline in their functional status and demonstrates the ability to make significant improvements in function in a reasonable and predictable amount of time.     Equipment Recommendations   None recommended by OT (pt has rec DME)     Recommendations for Other Services         Precautions/Restrictions   Precautions Precautions: Fall Recall of Precautions/Restrictions: Intact Restrictions Weight Bearing Restrictions Per Provider Order: Yes RUE Weight Bearing Per Provider Order:  (awaiting clarification) Other Position/Activity Restrictions: 2/6 note indicates pt can use RUE for AROM/AAROM to tolerance. Can strengthen as she can tolerate no formal WB restrictions.     Mobility Bed Mobility Overal bed mobility: Needs Assistance Bed  Mobility: Supine to Sit, Sit to Supine     Supine to sit: Contact guard, Used rails Sit to supine: Contact guard assist   General bed mobility comments: To the L    Transfers Overall transfer level: Needs assistance Equipment used: Rolling walker (2 wheels) Transfers: Sit to/from Stand Sit to Stand: Contact guard assist           General transfer comment: cues for hand placement      Balance Overall balance assessment: Needs assistance Sitting-balance support: No upper extremity supported, Feet supported Sitting balance-Leahy Scale: Good Sitting balance - Comments: good control of dynamic bal.   Standing balance support: Bilateral upper extremity supported, During functional activity, Reliant on assistive device for balance Standing balance-Leahy Scale: Poor Standing balance comment: RW support                           ADL either performed or assessed with clinical judgement   ADL Overall ADL's : Needs assistance/impaired Eating/Feeding: Bed level;Set up   Grooming: Standing;Contact guard assist   Upper Body Bathing: Sitting;Minimal assistance   Lower Body Bathing: Sitting/lateral leans;Set up   Upper Body Dressing : Sitting;Minimal assistance   Lower Body Dressing: Sitting/lateral leans;Set up Lower Body Dressing Details (indicate cue type and reason): doff/don socks Toilet Transfer: Contact guard assist;Rolling walker (2 wheels);Ambulation Toilet Transfer Details (indicate cue type and reason): limited distance         Functional mobility during ADLs: Contact guard assist;Rolling walker (2 wheels)       Vision         Perception  Praxis         Pertinent Vitals/Pain Pain Assessment Pain Assessment: Faces Faces Pain Scale: Hurts little more Pain Location: LLE when WB, shooting pain. also RUE with PROM Pain Descriptors / Indicators: Grimacing, Shooting, Aching, Discomfort Pain Intervention(s): Monitored during session,  Repositioned, Limited activity within patient's tolerance     Extremity/Trunk Assessment Upper Extremity Assessment Upper Extremity Assessment: RUE deficits/detail;Generalized weakness (LUE ROM WFL,) RUE Deficits / Details: AROM shoulder flex 20-30 deg. elbow flex AROM ~110 deg. Pain with any further PROM. RUE: Shoulder pain with ROM           Communication Communication Communication: Impaired Factors Affecting Communication: Reduced clarity of speech;Difficulty expressing self;Other (comment) (baseline aphasia)   Cognition Arousal: Alert Behavior During Therapy: WFL for tasks assessed/performed Cognition: Difficult to assess, No family/caregiver present to determine baseline Difficult to assess due to: Impaired communication           OT - Cognition Comments: pt with hx of expressive aphasia, follows commands well. Needs increased time to process.                 Following commands: Impaired Following commands impaired: Follows one step commands with increased time     Cueing  General Comments   Cueing Techniques: Verbal cues;Gestural cues  Pt c/o dizziness with prolonged standing. BP 103/79 during initial stand, 105/82 with prolonged standing. Sp02 stable on RA >96%   Exercises     Shoulder Instructions      Home Living Family/patient expects to be discharged to:: Private residence Living Arrangements: Other relatives;Children (lives wiht niece and son) Available Help at Discharge: Family;Available 24 hours/day Type of Home: Apartment Home Access: Level entry     Home Layout: One level     Bathroom Shower/Tub: Tub/shower unit;Curtain   Bathroom Toilet: Standard Bathroom Accessibility: Yes   Home Equipment: BSC/3in1;Rolling Walker (2 wheels);Shower seat;Grab bars - toilet   Additional Comments: auctistic brother stays with them, she does make some efforts to help out but niece mostly assist.      Prior Functioning/Environment Prior Level of  Function : Independent/Modified Independent;Driving             Mobility Comments: mod i with RW ADLs Comments: ind with ADLs. family manages medications.    OT Problem List: Pain;Impaired balance (sitting and/or standing);Decreased strength;Decreased activity tolerance;Impaired UE functional use;Decreased range of motion   OT Treatment/Interventions: Self-care/ADL training;Patient/family education;Therapeutic exercise;Balance training;Therapeutic activities;DME and/or AE instruction      OT Goals(Current goals can be found in the care plan section)   Acute Rehab OT Goals Patient Stated Goal: To get stronger, go home OT Goal Formulation: With patient Time For Goal Achievement: 04/12/24 Potential to Achieve Goals: Good ADL Goals Pt Will Perform Grooming: with supervision;standing Pt Will Perform Lower Body Bathing: with set-up;sitting/lateral leans Pt Will Perform Lower Body Dressing: with supervision;sit to/from stand Pt Will Transfer to Toilet: with supervision;ambulating Pt Will Perform Tub/Shower Transfer: shower seat;Tub transfer;with supervision   OT Frequency:  Min 2X/week    Co-evaluation              AM-PAC OT 6 Clicks Daily Activity     Outcome Measure Help from another person eating meals?: A Little Help from another person taking care of personal grooming?: A Little Help from another person toileting, which includes using toliet, bedpan, or urinal?: A Little Help from another person bathing (including washing, rinsing, drying)?: A Little Help from another person to put on and taking off  regular upper body clothing?: A Little Help from another person to put on and taking off regular lower body clothing?: A Little 6 Click Score: 18   End of Session Equipment Utilized During Treatment: Gait belt;Rolling walker (2 wheels) Nurse Communication: Mobility status  Activity Tolerance: Patient tolerated treatment well Patient left: in bed;with call bell/phone  within reach;with bed alarm set  OT Visit Diagnosis: Unsteadiness on feet (R26.81);Muscle weakness (generalized) (M62.81);Pain Pain - Right/Left: Left Pain - part of body: Leg                Time: 1500-1533 OT Time Calculation (min): 33 min Charges:  OT General Charges $OT Visit: 1 Visit OT Evaluation $OT Eval Moderate Complexity: 1 Mod OT Treatments $Therapeutic Activity: 8-22 mins  03/29/2024  AB, OTR/L  Acute Rehabilitation Services  Office: [phone]   Curtistine JONETTA Das 03/29/2024, 5:46 PM

## 2024-03-29 NOTE — Progress Notes (Signed)
 Cardizem  drip stopped at this time per MD for hr 63 and bp 95/68

## 2024-03-29 NOTE — Progress Notes (Addendum)
 PROGRESS NOTE    Regina [name]  FMW:[id number] DOB: 05/22/1966 DOA: 03/28/2024 PCP: Celestia Rosaline SQUIBB, NP  Chief Complaint  Patient presents with   Weakness    Brief Narrative:    Regina [name] is Regina [name] 58/F w metastatic non-small cell lung cancer, bone and brain metastasis, sp brain stereotactic radiosurgery [facility]), craniotomy in 1/25 and radiation therapy for Regina [name] pathologic fracture of the humerus and calvarial metastases, COPD, hypertension, anxiety.  She also has Aphasia at baseline, has been poorly compliant with her cancer treatments lately, she is also on Keppra  for seizure prophylaxis however unclear if she is compliant .  This is her third hospitalization in July, early July hospitalized at [facility] long with fluid overload, CHF exacerbation, subsequently admitted 7/14-7/22, with generalized body pains, dyspnea, encephalopathy treated with supportive care and discharged home 2 days ago.  Presented to the emergency room with weakness today, unable to provide much meaningful history, while in the ED she had Regina [name] tonic-clonic seizure was given 1 mg of Ativan , seizure stopped.  Case discussed with neurology and she was loaded with Keppra  .after this she had an episode of SVT, treated with adenosine followed by cardioversion.  Now slowly improving, unable to provide much meaningful history Labs noted sodium of 134, potassium 3.4, BUN less than 5, creatinine 0.5, WBC 10.6, hemoglobin 13, UDS positive for cannabis  - CTA, noted no PE, right apical subpleural nodule, L5 compression fracture, acute versus subacute , Left frontal lobe 2.5 cm mass with associated marked vasogenic edema leading to grossly stable 4 mm left-to-right midline shift.  Assessment & Plan:   Principal Problem:   Seizure (HCC) Active Problems:   Cerebral edema (HCC)   Primary adenocarcinoma of upper lobe of right lung (HCC)   Metastasis to bone (HCC)   COPD (chronic obstructive pulmonary disease) (HCC)   Lung cancer  metastatic to brain (HCC)   Aphasia   SVT (supraventricular tachycardia) (HCC)  Goals of Care Discussed with Regina [name].  She's difficult to understand at points.  When I discussed with her if she wanted to prioritize treatment vs comfort, she seemed to favor comfort - when I asked about hospice, it was unclear if she understood.  Given her confusion, I think it will be important to have family involved in goals of care conversations.  Her niece Regina [name] says she's previously signed HCPOA paperwork.  Patient has autistic brother in home and also lives with estranged son, it sounds like.  Regina [name] planning to be available for goc conversations today, palliative has been consulted.    Seizure [facility]) Brain metastasis Aphasia -admitting provider discussed with Dr. Merrianne, who recommended increasing Keppra  to 1gm BID, they will not follow for now - CT with L frontal lobe 2.5 cm mass with marked vasogenic edema - 4 mm L to R midline shift - decadron  - keppra , increased dose per neurology - She is followed by Dr. [name] and Dr. [name] but has not followed up in Regina [name] while - messaged on secure chat - appreciate palliative assistance with GOC conversations   SVT Sinus tachy - labs pending today  -Metoprolol , on dilt gtt - still tachy, but sinus  -check ECHo - CT PE protocol with no PE     Primary adenocarcinoma of upper lobe of right lung (HCC)   Metastasis to bone (HCC) - Has not had treatment for this in [name] very long time   Chronic Pain Will start short acting oxycodone , appears had prescription for ER last filled 6/9  COPD (chronic obstructive pulmonary disease) (HCC) -Stable  Distension of colon vs colitis - no abdominal symptoms, monitor for now  L5 compression fracture Noted    DVT prophylaxis: SCD Code Status: discussed today, encouraged continued conversation Family Communication: niece Disposition:   Status is: Inpatient Remains inpatient appropriate because: need for  ongoing care   Consultants:  Neuro oncology oncology  Procedures:  none  Antimicrobials:  Anti-infectives (From admission, onward)    None       Subjective: Asking about pain meds Difficult to understand  Discussed with niece - initially she thought her aunts lack of engagement with care was fear, now thinks there's Regina [name] cognitive component - family had arranged transportation, she'd say she was going, but she wasn't - Regina [name] signed HCPOA paperwork in the past - son lives in home with her, but they're estranged.  She cares for her autistic brother (not meaningfully since January)  Objective: Vitals:   03/29/24 0000 03/29/24 0400 03/29/24 0624 03/29/24 0816  BP: (!) 168/104 (!) 165/96 (!) 159/97   Pulse: (!) 102  (!) 115 (!) 117  Resp: (!) 28 (!) 28    Temp:  98.2 F (36.8 C)  98.6 F (37 C)  TempSrc:  Oral  Oral  SpO2: 96%     Weight:      Height:        Intake/Output Summary (Last 24 hours) at 03/29/2024 0843 Last data filed at 03/28/2024 2106 Gross per 24 hour  Intake 35.65 ml  Output --  Net 35.65 ml   Filed Weights   03/28/24 1411  Weight: 55 kg    Examination:  General exam: chronically ill appearing Respiratory system: unlabored Cardiovascular system: RRR, tachycardic Gastrointestinal system: Abdomen is nondistended, soft and nontender.  Central nervous system: Alert, but disoriented. Not consistently following commands, moving all extremities.  Extremities: no LEE   Data Reviewed: I have personally reviewed following labs and imaging studies  CBC: Recent Labs  Lab 03/28/24 1414 03/28/24 1445  WBC 10.6*  --   HGB 13.8 15.0  HCT 42.2 44.0  MCV 86.8  --   PLT 569*  --     Basic Metabolic Panel: Recent Labs  Lab 03/28/24 1414 03/28/24 1445 03/29/24 0439  NA 134* 134*  --   K 3.2* 3.1*  --   CL 96* 97*  --   CO2 20*  --   --   GLUCOSE 130* 128*  --   BUN <5* <3*  --   CREATININE 0.59 0.40*  --   CALCIUM  9.0  --   --   MG  --   --   2.0    GFR: Estimated Creatinine Clearance: 64.9 mL/min ([name]) (by C-G formula based on SCr of 0.4 mg/dL (L)).  Liver Function Tests: Recent Labs  Lab 03/28/24 1414  AST 24  ALT 9  ALKPHOS 168*  BILITOT 0.9  PROT 6.9  ALBUMIN 3.0*    CBG: Recent Labs  Lab 03/28/24 1430  GLUCAP 132*     No results found for this or any previous visit (from the past 240 hours).       Radiology Studies: DG Chest Portable 1 View Result Date: 03/28/2024 CLINICAL DATA:  Chest pain EXAM: PORTABLE CHEST 1 VIEW COMPARISON:  Chest x-ray performed March 12, 2024 FINDINGS: Numerous leads overlie the patient. Mild interstitial prominence, similar. No significant pleural effusion or pneumothorax. IMPRESSION: 1. Chronic appearing interstitial changes. 2. No pleural effusion or pneumothorax. Electronically Signed   By:  Maude Naegeli M.D.   On: 03/28/2024 16:01   CT Angio Chest PE W and/or Wo Contrast Result Date: 03/28/2024 CLINICAL DATA:  Concern for pulmonary edema.  Also sepsis. EXAM: CT ANGIOGRAPHY CHEST CT ABDOMEN AND PELVIS WITH CONTRAST TECHNIQUE: Multidetector CT imaging of the chest was performed using the standard protocol during bolus administration of intravenous contrast. Multiplanar CT image reconstructions and MIPs were obtained to evaluate the vascular anatomy. Multidetector CT imaging of the abdomen and pelvis was performed using the standard protocol during bolus administration of intravenous contrast. RADIATION DOSE REDUCTION: This exam was performed according to the departmental dose-optimization program which includes automated exposure control, adjustment of the mA and/or kV according to patient size and/or use of iterative reconstruction technique. CONTRAST:  60mL OMNIPAQUE  IOHEXOL  350 MG/ML SOLN COMPARISON:  Chest radiograph dated 03/28/2024 and CT dated 03/23/2024. FINDINGS: Evaluation of this exam is limited due to respiratory motion and streak artifact caused by patient's arms. CTA CHEST  FINDINGS Cardiovascular: There is no cardiomegaly or pericardial effusion. Mild atherosclerotic calcification of the thoracic aorta. No aneurysmal dilatation or dissection. No pulmonary artery embolus identified. Mediastinum/Nodes: There is bilateral hilar and mediastinal adenopathy measuring 12 mm in short axis on the right. Subcarinal lymph node measures approximately 2.2 cm. Overall similar or slightly improved adenopathy compared to prior CT. The esophagus is grossly unremarkable. No mediastinal fluid collection. Lungs/Pleura: Background of emphysema on biapical subpleural scarring. Interval decrease in the size of the right apical subpleural nodule. Stephon Weathers 1.8 cm subpleural bandlike density in the right apex (22/9) likely residual infiltrate or scarring. Continued follow-up recommended. No new nodule. No consolidative changes. There is no pleural effusion or pneumothorax. The central airways are patent. Musculoskeletal: No acute osseous pathology. Right humeral intramedullary nail. Review of the MIP images confirms the above findings. CT ABDOMEN and PELVIS FINDINGS No intra-abdominal free air or free fluid. Hepatobiliary: The liver is unremarkable. No biliary dilatation. The gallbladder is unremarkable Pancreas: Unremarkable. No pancreatic ductal dilatation or surrounding inflammatory changes. Spleen: Normal in size without focal abnormality. Adrenals/Urinary Tract: The adrenal glands unremarkable. There is no hydronephrosis on either side. There is symmetric enhancement and excretion of contrast by both kidneys. The visualized ureters and urinary bladder appear unremarkable. Stomach/Bowel: There is no bowel obstruction. Mild diffuse thickened appearance of the colon may be related to underdistention or represent colitis. Clinical correlation is recommended. Several small scattered colonic diverticula. The appendix is normal. Vascular/Lymphatic: Mild aortoiliac atherosclerotic disease. The IVC is unremarkable no  portal venous gas. There is no adenopathy. Reproductive: Several calcified uterine fibroids. No suspicious adnexal masses. Other: None Musculoskeletal: Osteopenia with degenerative changes. L5 compression fracture with approximately 40-50% loss of vertebral body height, likely acute or subacute. Clinical correlation recommended. Review of the MIP images confirms the above findings. IMPRESSION: 1. No CT evidence of pulmonary artery embolus. 2. Interval decrease in the size of the right apical subpleural nodule. Jermane Brayboy 1.8 cm subpleural bandlike density in the right apex likely residual infiltrate or scarring. Continued follow-up recommended. 3. Distension of the colon versus colitis.  No bowel obstruction. 4. Mild colonic diverticulosis. 5. L5 compression fracture, likely acute or subacute. 6. Aortic Atherosclerosis (ICD10-I70.0) and Emphysema (ICD10-J43.9). Electronically Signed   By: Vanetta Chou M.D.   On: 03/28/2024 16:00   CT ABDOMEN PELVIS W CONTRAST Result Date: 03/28/2024 CLINICAL DATA:  Concern for pulmonary edema.  Also sepsis. EXAM: CT ANGIOGRAPHY CHEST CT ABDOMEN AND PELVIS WITH CONTRAST TECHNIQUE: Multidetector CT imaging of the chest was performed  using the standard protocol during bolus administration of intravenous contrast. Multiplanar CT image reconstructions and MIPs were obtained to evaluate the vascular anatomy. Multidetector CT imaging of the abdomen and pelvis was performed using the standard protocol during bolus administration of intravenous contrast. RADIATION DOSE REDUCTION: This exam was performed according to the departmental dose-optimization program which includes automated exposure control, adjustment of the mA and/or kV according to patient size and/or use of iterative reconstruction technique. CONTRAST:  60mL OMNIPAQUE  IOHEXOL  350 MG/ML SOLN COMPARISON:  Chest radiograph dated 03/28/2024 and CT dated 03/23/2024. FINDINGS: Evaluation of this exam is limited due to respiratory motion  and streak artifact caused by patient's arms. CTA CHEST FINDINGS Cardiovascular: There is no cardiomegaly or pericardial effusion. Mild atherosclerotic calcification of the thoracic aorta. No aneurysmal dilatation or dissection. No pulmonary artery embolus identified. Mediastinum/Nodes: There is bilateral hilar and mediastinal adenopathy measuring 12 mm in short axis on the right. Subcarinal lymph node measures approximately 2.2 cm. Overall similar or slightly improved adenopathy compared to prior CT. The esophagus is grossly unremarkable. No mediastinal fluid collection. Lungs/Pleura: Background of emphysema on biapical subpleural scarring. Interval decrease in the size of the right apical subpleural nodule. Saulo Anthis 1.8 cm subpleural bandlike density in the right apex (22/9) likely residual infiltrate or scarring. Continued follow-up recommended. No new nodule. No consolidative changes. There is no pleural effusion or pneumothorax. The central airways are patent. Musculoskeletal: No acute osseous pathology. Right humeral intramedullary nail. Review of the MIP images confirms the above findings. CT ABDOMEN and PELVIS FINDINGS No intra-abdominal free air or free fluid. Hepatobiliary: The liver is unremarkable. No biliary dilatation. The gallbladder is unremarkable Pancreas: Unremarkable. No pancreatic ductal dilatation or surrounding inflammatory changes. Spleen: Normal in size without focal abnormality. Adrenals/Urinary Tract: The adrenal glands unremarkable. There is no hydronephrosis on either side. There is symmetric enhancement and excretion of contrast by both kidneys. The visualized ureters and urinary bladder appear unremarkable. Stomach/Bowel: There is no bowel obstruction. Mild diffuse thickened appearance of the colon may be related to underdistention or represent colitis. Clinical correlation is recommended. Several small scattered colonic diverticula. The appendix is normal. Vascular/Lymphatic: Mild aortoiliac  atherosclerotic disease. The IVC is unremarkable no portal venous gas. There is no adenopathy. Reproductive: Several calcified uterine fibroids. No suspicious adnexal masses. Other: None Musculoskeletal: Osteopenia with degenerative changes. L5 compression fracture with approximately 40-50% loss of vertebral body height, likely acute or subacute. Clinical correlation recommended. Review of the MIP images confirms the above findings. IMPRESSION: 1. No CT evidence of pulmonary artery embolus. 2. Interval decrease in the size of the right apical subpleural nodule. Ramiyah Mcclenahan 1.8 cm subpleural bandlike density in the right apex likely residual infiltrate or scarring. Continued follow-up recommended. 3. Distension of the colon versus colitis.  No bowel obstruction. 4. Mild colonic diverticulosis. 5. L5 compression fracture, likely acute or subacute. 6. Aortic Atherosclerosis (ICD10-I70.0) and Emphysema (ICD10-J43.9). Electronically Signed   By: Vanetta Chou M.D.   On: 03/28/2024 16:00   CT Head Wo Contrast Result Date: 03/28/2024 CLINICAL DATA:  Neuro deficit, acute, stroke suspected EXAM: CT HEAD WITHOUT CONTRAST TECHNIQUE: Contiguous axial images were obtained from the base of the skull through the vertex without intravenous contrast. RADIATION DOSE REDUCTION: This exam was performed according to the departmental dose-optimization program which includes automated exposure control, adjustment of the mA and/or kV according to patient size and/or use of iterative reconstruction technique. COMPARISON:  CT head 03/19/2024, MRI head 02/22/2024 FINDINGS: Brain: Patchy and confluent areas of decreased  attenuation are noted throughout the deep and periventricular white matter of the cerebral hemispheres bilaterally, compatible with chronic microvascular ischemic disease. No evidence of large-territorial acute infarction. No parenchymal hemorrhage. Redemonstration of Kupono Marling left frontal lobe 2.5 cm mass with associated marked vasogenic  edema leading to grossly stable 4 mm left-to-right midline shift. No extra-axial collection. No hydrocephalus. Basilar cisterns are patent. Vascular: No hyperdense vessel. Skull: No acute fracture or focal lesion.  Prior left craniotomy. Sinuses/Orbits: Paranasal sinuses and mastoid air cells are clear. The orbits are unremarkable. Other: None. 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 Electronically Signed   By: Morgane  Naveau M.D.   On: 03/28/2024 15:53        Scheduled Meds:  dexamethasone  (DECADRON ) injection  4 mg Intravenous Q12H   metoprolol  tartrate  25 mg Oral BID   Continuous Infusions:  diltiazem  (CARDIZEM ) infusion 15 mg/hr (03/29/24 0238)     LOS: 1 day    Time spent: over 30 min    Meliton Monte, MD [facility]   To contact the attending provider between 7A-7P or the covering provider during after hours 7P-7A, please log into the web site [email or web address] and access using universal [place] password for that web site. If you do not have the password, please call the hospital operator.  03/29/2024, 8:43 AM

## 2024-03-29 NOTE — Consult Note (Signed)
 Palliative Care Consult Note                                  Date: 03/29/2024   Patient Name: Regina [name]  DOB: 07/16/1966  MRN: [id number]  Age / Sex: 58 y.o., female  PCP: Celestia Rosaline SQUIBB, NP Referring Physician: Perri DELENA Meliton Mickey., *  Reason for Consultation: Establishing goals of care  HPI/Patient Profile: 58 y.o. female  with past medical history of metastatic non-small cell lung cancer with bone and brain metastases status post brain stereotactic radiosurgery [facility]), craniotomy in 09/2023, and radiation therapy for a pathologic fracture of the humerus and calvarial metastases, COPD, hypertension, and anxiety.  She presented to the ED on 03/28/2024 with weakness and was unable to provide much meaningful history. While in the ED, she had a tonic clonic seizure, and was given 1 mg of Ativan  and loaded with Keppra .  She also had an episode of SVT, treated with adenosine followed by cardioversion. Imaging showed no PE, right apical subpleural nodule, L5 compression fracture (acute versus subacute), frontal lobe 2.5 cm mass with associated marked vasogenic edema and grossly stable 4 mm left-to-right midline shift.  Palliative Medicine has been consulted for goals of care discussions. Patient and family are faced with anticipatory care needs and complex medical decision making.   Clinical Assessment and Goals of Care:   Extensive chart review has been completed including labs, vital signs, imaging, progress/consult notes, orders, medications and available advance directive documents.    Update received from RN. Patient assessed at bedside. She tells me she is not doing good and endorses feeling tired. She does seem to have insight into her current medical situation.   I spoke with her niece/[name] to discuss diagnosis, prognosis, GOC, EOL wishes, disposition, and options.  Patient is known to PMT from previous hospitalizations.  I  re-introduced Palliative Medicine as specialized medical care for people living with serious illness. It focuses on providing relief from the symptoms and stress of a serious illness.   Created space and opportunity for patient and family to express thoughts and feelings regarding current medical situation. Values and goals of care were attempted to be elicited.  Discussion: We discussed patient's current illness and what it means in the larger context of her ongoing co-morbidities. Current clinical status was reviewed.  We discussed the natural trajectory of metastatic cancer as a terminal condition.  Niece verbalizes that patient's condition has been rapidly declining.  The difference between full scope medical intervention and comfort care was considered.  I introduced the concept of a comfort path, as de-escalating full scope medical interventions and allowing a natural course to occur. Discussed that the goal is comfort and dignity rather than cure/prolonging life.  Introduced hospice philosophy and provided information on home versus residential hospice services.   Discussed code status. I encouraged consideration of DNR/DNI status and provided education on evidence-based poor outcomes in similar hospitalized patients, as the cause of cardiac arrest would likely be associated with advanced illness rather than a reversible condition.  Niece agrees that DNR status is appropriate in the setting of patient's terminal illness.  Niece shares that patient lives in an apartment with her brother (who is autistic). Niece expresses concern that patient is no longer able to care for her brother and is interested in having him placed in a group home. Discussed need for TOC involvement for guidance regarding this process.  Niece is open to comfort-focused care and hospice, but states that home hospice is not an option. She would consider transition to an inpatient hospice facility if patient is eligible for  this level of care.  Questions and concerns addressed.  Emotional support provided.  Plan is to follow-up on Saturday 7/26, to meet in person with niece and patient's son regarding next steps of care.   Review of Systems  Constitutional:  Positive for fatigue.    Objective:   Primary Diagnoses: Present on Admission:  Cerebral edema (HCC)  Metastasis to bone [facility])  Primary adenocarcinoma of upper lobe of right lung (HCC)  Lung cancer metastatic to brain [facility])  COPD (chronic obstructive pulmonary disease) (HCC)  Aphasia   Physical Exam Vitals reviewed.  Constitutional:      General: She is awake. She is not in acute distress.    Appearance: She is ill-appearing.  Pulmonary:     Effort: Pulmonary effort is normal.  Neurological:     Mental Status: She is confused.  Psychiatric:        Mood and Affect: Affect is flat.        Cognition and Memory: Memory is impaired.     Palliative Assessment/Data: PPS 30%      Assessment & Plan:   SUMMARY OF RECOMMENDATIONS   Code status changed to DNR/DNI Continue current supportive interventions Niece/HCPOA is open to inpatient hospice facility if patient is eligible TOC referral to assist with counseling for patient's brother PMT will continue to support - plan to meet with niece and son on Saturday 7/26  Primary Decision Maker: HCPOA  Existing Vynca/ACP Documentation: HCPOA document naming niece [name] Bendall-Crawford as surrogate decision maker  Prognosis:  Poor overall  Discharge Planning:  To Be Determined   Discussed with: Dr. Perri, [facility], RN   Thank you for allowing us  to participate in the care of Jennica Cundy   Time Total: 80 minutes  Detailed review of medical records (labs, imaging, vital signs), medically appropriate exam, discussed with treatment team, counseling and education to patient, family, & staff, documenting clinical information, coordination of care.   Signed by: Recardo Loll,  NP Palliative Medicine Team  Team Phone # [phone]  For individual providers, please see [facility]              '

## 2024-03-29 NOTE — Plan of Care (Signed)

## 2024-03-30 DIAGNOSIS — R569 Unspecified convulsions: Secondary | ICD-10-CM | POA: Diagnosis not present

## 2024-03-30 NOTE — Evaluation (Addendum)
 Physical Therapy Evaluation Patient Details Name: Regina [name] MRN: [id number] DOB: 1966-07-27 Today's Date: 03/30/2024  History of Present Illness  Pt is a 58 y/o F admitted on 03/28/24 after presenting with c/o weakness. While in ED, pt had tonic-clonic seizure, later an episode of SVT. Pt noted to have acute vs subacute L5 compression fx, L frontal lobe mass leading to 4mm L>R midline shift. This is pt's 3rd hospital admission this month. 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 Pt with limited ability to provide PLOF/home set up information, does report she ambulates with RW & denies falls. On this date, pt is able to complete bed mobility with PT educating her on log rolling, pt ambulates in room with RW & CGA but does experience LOB when attempting to impulsively step EOB without RW & requires mod assist to correct. At this time, pt unsafe to d/c home alone, recommend post acute rehab <3 hours therapy/day unless pt does have assistance at home, then recommend d/c with HHPT f/u.      If plan is discharge home, recommend the following: A little help with walking and/or transfers;Assistance with cooking/housework;A little help with bathing/dressing/bathroom;Assist for transportation;Help with stairs or ramp for entrance   Can travel by private vehicle   Yes    Equipment Recommendations  (TBD)  Recommendations for Other Services       Functional Status Assessment Patient has had a recent decline in their functional status and demonstrates the ability to make significant improvements in function in a reasonable and predictable amount of time.     Precautions / Restrictions Precautions Precautions: Fall Precaution/Restrictions Comments: hx of  aphasia Restrictions Weight Bearing Restrictions Per Provider Order: No      Mobility  Bed Mobility Overal bed mobility: Needs Assistance Bed Mobility: Sidelying to Sit, Rolling, Sit to Sidelying Rolling: Supervision, Used rails Sidelying to sit: Supervision, HOB elevated, Used rails   Sit to supine: Supervision, HOB elevated, Used rails   General bed mobility comments: cuing re: log rolling to reduce back pain, exit R side of bed    Transfers Overall transfer level: Needs assistance Equipment used: Rolling walker (2 wheels) Transfers: Sit to/from Stand Sit to Stand: Contact guard assist           General transfer comment: sit>stand from EOB    Ambulation/Gait Ambulation/Gait assistance: Contact guard assist Gait Distance (Feet): 20 Feet Assistive device: Rolling walker (2 wheels) Gait Pattern/deviations: Decreased step length - right, Decreased step length - left, Decreased stride length Gait velocity: decreased     General Gait Details: Pt only agreeable to ambulating in room, not willing to go out of room. When pt returned to EOB, PT cues pt to take steps to R along EOB to [facility] but pt impulsive, attempting to let go of walker when doing so, requires mod assist to correct LOB & sit EOB.  Stairs            Wheelchair Mobility     Tilt Bed    Modified Rankin (Stroke Patients Only)       Balance Overall balance assessment: Needs assistance Sitting-balance support: Feet supported, No upper extremity supported Sitting balance-Leahy Scale: Fair     Standing balance support: Bilateral upper extremity supported, During functional activity, Reliant on assistive device for balance  Standing balance-Leahy Scale: Fair                               Pertinent Vitals/Pain Pain Assessment Pain Assessment: Faces Faces Pain Scale: Hurts even more Pain Location: LLE with touch, movement Pain Descriptors / Indicators: Grimacing, Discomfort Pain  Intervention(s): Limited activity within patient's tolerance, Monitored during session, Repositioned,nurse made aware    Home Living Family/patient expects to be discharged to:: Private residence Living Arrangements: Other relatives;Children (neice & (?estranged) son) Available Help at Discharge: Family;Available 24 hours/day Type of Home: Apartment Home Access: Level entry       Home Layout: One level Home Equipment: BSC/3in1;Rolling Walker (2 wheels);Shower seat;Grab bars - toilet Additional Comments: auctistic brother stays with them, she does make some efforts to help out but niece mostly assist.    Prior Function Prior Level of Function : Independent/Modified Independent;Driving             Mobility Comments: mod i with RW ADLs Comments: ind with ADLs. family manages medications.     Extremity/Trunk Assessment   Upper Extremity Assessment Upper Extremity Assessment: Defer to OT evaluation;Generalized weakness    Lower Extremity Assessment Lower Extremity Assessment: Generalized weakness       Communication   Communication Communication: Impaired Factors Affecting Communication: Reduced clarity of speech;Difficulty expressing self;Other (comment) (hx of aphasia)    Cognition Arousal: Alert Behavior During Therapy: WFL for tasks assessed/performed   PT - Cognitive impairments: Difficult to assess Difficult to assess due to: Impaired communication                       Following commands: Impaired Following commands impaired: Follows one step commands with increased time     Cueing Cueing Techniques: Verbal cues, Gestural cues     General Comments      Exercises     Assessment/Plan    PT Assessment Patient needs continued PT services  PT Problem List Decreased strength;Pain;Decreased cognition;Decreased balance;Decreased activity tolerance;Decreased safety awareness;Decreased knowledge of use of DME;Decreased mobility       PT Treatment  Interventions DME instruction;Balance training;Gait training;Neuromuscular re-education;Functional mobility training;Therapeutic activities;Patient/family education;Therapeutic exercise    PT Goals (Current goals can be found in the Care Plan section)  Acute Rehab PT Goals Patient Stated Goal: none stated PT Goal Formulation: Patient unable to participate in goal setting Time For Goal Achievement: 04/13/24 Potential to Achieve Goals: Fair    Frequency Min 2X/week     Co-evaluation               AM-PAC PT 6 Clicks Mobility  Outcome Measure Help needed turning from your back to your side while in a flat bed without using bedrails?: None Help needed moving from lying on your back to sitting on the side of a flat bed without using bedrails?: None Help needed moving to and from a bed to a chair (including a wheelchair)?: A Little Help needed standing up from a chair using your arms (e.g., wheelchair or bedside chair)?: A Little Help needed to walk in hospital room?: A Little Help needed climbing 3-5 steps with a railing? : A Little 6 Click Score: 20    End of Session   Activity Tolerance: Patient tolerated treatment well Patient left: in bed;with call bell/phone within reach;with bed alarm set Nurse Communication: Mobility status PT Visit Diagnosis: Unsteadiness on feet (R26.81);Muscle weakness (generalized) (M62.81);Other abnormalities of gait and mobility (R26.89)  Time: 8887-8876 PT Time Calculation (min) (ACUTE ONLY): 11 min   Charges:   PT Evaluation $PT Eval Moderate Complexity: 1 Mod   PT General Charges $$ ACUTE PT VISIT: 1 Visit         Richerd Pinal, PT, DPT 03/30/24, 12:48 PM   Richerd CHRISTELLA Pinal 03/30/2024, 12:46 PM

## 2024-03-30 NOTE — Plan of Care (Signed)

## 2024-03-30 NOTE — Progress Notes (Signed)
 Speech Language Pathology Treatment: Dysphagia  Patient Details Name: Leili Eskenazi MRN: [id number] DOB: [birth year] Today's Date: 03/30/2024 Time: 9086-9078 SLP Time Calculation (min) (ACUTE ONLY): 8 min  Assessment / Plan / Recommendation Clinical Impression  Pt drank thin liquids via straw, self-feeding and self-pacing with no overt s/s of dysphagia or aspiration. She continues to decline solids and did not eat any for SLP today, including regular and pureed textures offered. With her aphasia she has trouble providing information about how meals have been going, and there is no intake that I can see documented in the chart. Discussed with RN - will leave on current diet for now, and nursing can soften diet if there is any concern about the consistency contributing to limited intake. Swallowing overall appears to be pretty functional though so hopeful that she will do well when she chooses to eat. SLP will f/u briefly and as appropriate (note GOC meeting planned for this weekend with inpatient hospice being considered).    HPI HPI: Pt is a 58 yo female presenting 7/23 with AMS and weakness. She had tonic-clonic seizure in the ED. Pt had two recent hospitalizations earlier in July [facility] for fluid overload/CHF exacerbation; outside hospital with generalized body pains, dyspnea,  encephalopathy, and had clinical swallow eval this admission that was [facility]). PMH includes:  metastatic non-small cell lung cancer, bone and brain metastasis, s/p brain stereotactic radiosurgery [facility]), craniotomy in 1/25 (residual aphasia at baseline) and radiation therapy for a pathologic fracture of the humerus and calvarial metastases, COPD, hypertension, anxiety      SLP Plan  Continue with current plan of care          Recommendations  Diet recommendations: Dysphagia 3 (mechanical soft);Thin liquid Liquids provided via: Cup;Straw Medication Administration: Whole meds with liquid Supervision: Staff to assist with  self feeding Compensations: Slow rate;Small sips/bites Postural Changes and/or Swallow Maneuvers: Seated upright 90 degrees                  Oral care BID     Dysphagia, unspecified (R13.10)     Continue with current plan of care     Leita SAILOR., M.A. CCC-SLP Acute Rehabilitation Services Office: [phone]  Secure chat preferred   03/30/2024, 10:01 AM

## 2024-03-30 NOTE — Progress Notes (Signed)
 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  She also has Aphasia at baseline, has been poorly compliant with her cancer treatments lately, she is also on Keppra  for seizure prophylaxis however unclear if she is compliant .  This is her third hospitalization in July, early July hospitalized at [facility] long with fluid overload, CHF exacerbation, subsequently admitted 7/14-7/22, with generalized body pains, dyspnea, encephalopathy treated with supportive care and discharged home 2 days ago.  Presented to the emergency room with weakness today, unable to provide much meaningful history, while in the ED she had Mariadelosang Wynns tonic-clonic seizure was given 1 mg of Ativan , seizure stopped.  Case discussed with neurology and she was loaded with Keppra  .after this she had an episode of SVT, treated with adenosine followed by cardioversion.  Now slowly improving, unable to provide much meaningful history Labs noted sodium of 134, potassium 3.4, BUN less than 5, creatinine 0.5, WBC 10.6, hemoglobin 13, UDS positive for cannabis  - CTA, noted no PE, right apical subpleural nodule, L5 compression fracture, acute versus subacute , Left frontal lobe 2.5 cm mass with associated marked vasogenic edema leading to grossly stable 4 mm left-to-right midline shift.  Assessment & Plan:   Principal Problem:   Seizure (HCC) Active Problems:   Cerebral edema (HCC)   Primary adenocarcinoma of upper lobe of right lung (HCC)   Metastasis to bone (HCC)   COPD (chronic obstructive pulmonary disease) (HCC)   Lung cancer  metastatic to brain [facility])   Aphasia   SVT (supraventricular tachycardia) (HCC)  Goals of Care Appreciate palliative assistance.  They're looking into inpatient hospice facility if eligible.   Seizure (HCC) Brain metastasis Aphasia - admitting provider discussed with Dr. Merrianne, who recommended increasing Keppra  to 1gm BID, they will not follow for now - CT with L frontal lobe 2.5 cm mass with marked vasogenic edema - 4 mm L to R midline shift - decadron  - keppra , increased dose per neurology - She is followed by Dr. [name] and Dr. [name] but has not followed up in [place] while - messaged on secure chat (per secure chat message, Dr. [name] felt hospice reasonable) - appreciate palliative assistance with GOC conversations   SVT Sinus tachy  -Metoprolol  - still tachy, but sinus  -EF 70-75%, hyperdynamic - CT PE protocol with no PE     Primary adenocarcinoma of upper lobe of right lung (HCC)   Metastasis to bone (HCC) - Has not had treatment for this in [place] very long time   Chronic Pain Will start short acting oxycodone , appears had prescription for ER last filled 6/9     COPD (chronic obstructive pulmonary disease) (HCC) -Stable  Distension of colon vs colitis - no abdominal symptoms, monitor for now  L5 compression fracture Noted    DVT prophylaxis: SCD Code Status: discussed today, encouraged continued conversation Family Communication: niece Disposition:   Status is: Inpatient Remains inpatient appropriate because: need for ongoing care   Consultants:  Neuro oncology oncology  Procedures:  none  Antimicrobials:  Anti-infectives (From admission, onward)    None  Subjective: Difficult to understand sometimes, confused  Objective: Vitals:   03/30/24 0830 03/30/24 1100 03/30/24 1153 03/30/24 1200  BP: (!) 178/105 (!) 181/110  (!) 157/102  Pulse: 98 96  86  Resp: (!) 22 (!) 25  (!) 23  Temp: 98.4 F (36.9 C)  98 F (36.7 C)   TempSrc: Oral      SpO2: 97% 96%  97%  Weight:      Height:       No intake or output data in the 24 hours ending 03/30/24 1407  Filed Weights   03/28/24 1411  Weight: 55 kg    Examination:  General: chronically ill appearing, confused. Cardiovascular: RRR Lungs: unclear Abdomen: Soft, nontender, nondistended  Neurological: confused, moving all extremities Extremities: No clubbing or cyanosis. No edema.   Data Reviewed: I have personally reviewed following labs and imaging studies  CBC: Recent Labs  Lab 03/28/24 1414 03/28/24 1445 03/29/24 1103  WBC 10.6*  --  11.4*  NEUTROABS  --   --  9.2*  HGB 13.8 15.0 14.9  HCT 42.2 44.0 44.4  MCV 86.8  --  83.6  PLT 569*  --  671*    Basic Metabolic Panel: Recent Labs  Lab 03/28/24 1414 03/28/24 1445 03/29/24 0439 03/29/24 1103  NA 134* 134*  --  132*  K 3.2* 3.1*  --  4.3  CL 96* 97*  --  95*  CO2 20*  --   --  22  GLUCOSE 130* 128*  --  163*  BUN <5* <3*  --  9  CREATININE 0.59 0.40*  --  0.57  CALCIUM  9.0  --   --  9.6  MG  --   --  2.0 1.9  PHOS  --   --   --  4.1    GFR: Estimated Creatinine Clearance: 64.9 mL/min (by C-G formula based on SCr of 0.57 mg/dL).  Liver Function Tests: Recent Labs  Lab 03/28/24 1414 03/29/24 1103  AST 24 19  ALT 9 11  ALKPHOS 168* 167*  BILITOT 0.9 0.8  PROT 6.9 7.4  ALBUMIN 3.0* 3.2*    CBG: Recent Labs  Lab 03/28/24 1430  GLUCAP 132*     Recent Results (from the past 240 hours)  Culture, blood (routine x 2)     Status: None (Preliminary result)   Collection Time: 03/28/24  5:00 PM   Specimen: BLOOD RIGHT HAND  Result Value Ref Range Status   Specimen Description BLOOD RIGHT HAND  Final   Special Requests   Final    BOTTLES DRAWN AEROBIC ONLY Blood Culture results may not be optimal due to an inadequate volume of blood received in culture bottles   Culture   Final    NO GROWTH 2 DAYS Performed at [facility], [address], [place], [place] [address]    Report  Status PENDING  Incomplete  Culture, blood (routine x 2)     Status: None (Preliminary result)   Collection Time: 03/28/24  5:08 PM   Specimen: BLOOD RIGHT HAND  Result Value Ref Range Status   Specimen Description BLOOD RIGHT HAND  Final   Special Requests   Final    BOTTLES DRAWN AEROBIC ONLY Blood Culture results may not be optimal due to an inadequate volume of blood received in culture bottles   Culture   Final    NO GROWTH 2 DAYS Performed at [facility], [address], [place], [place] [address]  Report Status PENDING  Incomplete         Radiology Studies: ECHOCARDIOGRAM COMPLETE Result Date: 03/29/2024    ECHOCARDIOGRAM REPORT   Patient Name:   LIDDIE CHICHESTER Date of Exam: 03/29/2024 Medical Rec #:  [id number]      Height:       66.0 in Accession #:    [id number]     Weight:       121.3 lb Date of Birth:  Jun 26, 1966      BSA:          1.617 m Patient Age:    58 years       BP:           159/97 mmHg Patient Gender: F              HR:           106 bpm. Exam Location:  Inpatient Procedure: 2D Echo, Color Doppler and Cardiac Doppler (Both Spectral and Color            Flow Doppler were utilized during procedure). Indications:    CHF  History:        Patient has no prior history of Echocardiogram examinations.  Sonographer:    Benard Stallion Referring Phys: [phone] PREETHA JOSEPH IMPRESSIONS  1. Mild intercavitary gradient. Left ventricular ejection fraction, by estimation, is 70 to 75%. The left ventricle has hyperdynamic function. The left ventricle has no regional wall motion abnormalities. There is mild concentric left ventricular hypertrophy. Left ventricular diastolic parameters are indeterminate.  2. Right ventricular systolic function is mildly reduced. The right ventricular size is normal.  3. The mitral valve is normal in structure. No evidence of mitral valve regurgitation. No evidence of mitral stenosis.  4. The aortic valve is normal in structure. Aortic valve  regurgitation is not visualized. No aortic stenosis is present. FINDINGS  Left Ventricle: Mild intercavitary gradient. Left ventricular ejection fraction, by estimation, is 70 to 75%. The left ventricle has hyperdynamic function. The left ventricle has no regional wall motion abnormalities. The left ventricular internal cavity size was normal in size. There is mild concentric left ventricular hypertrophy. Left ventricular diastolic parameters are indeterminate. Right Ventricle: The right ventricular size is normal. No increase in right ventricular wall thickness. Right ventricular systolic function is mildly reduced. Left Atrium: Left atrial size was normal in size. Right Atrium: Right atrial size was normal in size. Pericardium: There is no evidence of pericardial effusion. Mitral Valve: The mitral valve is normal in structure. No evidence of mitral valve regurgitation. No evidence of mitral valve stenosis. Tricuspid Valve: The tricuspid valve is normal in structure. Tricuspid valve regurgitation is mild . No evidence of tricuspid stenosis. Aortic Valve: The aortic valve is normal in structure. Aortic valve regurgitation is not visualized. No aortic stenosis is present. Aortic valve mean gradient measures 3.0 mmHg. Aortic valve peak gradient measures 5.6 mmHg. Aortic valve area, by VTI measures 2.30 cm. Pulmonic Valve: The pulmonic valve was normal in structure. Pulmonic valve regurgitation is not visualized. No evidence of pulmonic stenosis. Aorta: The aortic root is normal in size and structure. Venous: The inferior vena cava was not well visualized. IAS/Shunts: The interatrial septum was not assessed.  LEFT VENTRICLE PLAX 2D LVIDd:         2.60 cm   Diastology LVIDs:         1.50 cm   LV e' medial:    3.59 cm/s LV PW:  1.10 cm   LV E/e' medial:  19.4 LV IVS:        1.20 cm   LV e' lateral:   8.59 cm/s LVOT diam:     1.90 cm   LV E/e' lateral: 8.1 LV SV:         39 LV SV Index:   24 LVOT Area:     2.84  cm  RIGHT VENTRICLE RV Basal diam:  2.80 cm RV Mid diam:    2.70 cm RV S prime:     5.98 cm/s TAPSE (M-mode): 1.9 cm LEFT ATRIUM           Index        RIGHT ATRIUM          Index LA diam:      1.70 cm 1.05 cm/m   RA Area:     8.49 cm LA Vol (A2C): 31.0 ml 19.18 ml/m  RA Volume:   15.90 ml 9.84 ml/m LA Vol (A4C): 13.0 ml 8.04 ml/m  AORTIC VALVE AV Area (Vmax):    2.39 cm AV Area (Vmean):   2.28 cm AV Area (VTI):     2.30 cm AV Vmax:           118.00 cm/s AV Vmean:          80.100 cm/s AV VTI:            0.172 m AV Peak Grad:      5.6 mmHg AV Mean Grad:      3.0 mmHg LVOT Vmax:         99.40 cm/s LVOT Vmean:        64.300 cm/s LVOT VTI:          0.139 m LVOT/AV VTI ratio: 0.81  AORTA Ao Root diam: 2.50 cm Ao Asc diam:  3.00 cm MITRAL VALVE               TRICUSPID VALVE MV Area (PHT): 5.66 cm    TR Peak grad:   41.7 mmHg MV Decel Time: 134 msec    TR Vmax:        323.00 cm/s MV E velocity: 69.70 cm/s MV [place] velocity: 91.30 cm/s  SHUNTS MV E/Chaelyn Bunyan ratio:  0.76        Systemic VTI:  0.14 m                            Systemic Diam: 1.90 cm Morene Brownie Electronically signed by Morene Brownie Signature Date/Time: 03/29/2024/12:45:53 PM    Final    DG Chest Portable 1 View Result Date: 03/28/2024 CLINICAL DATA:  Chest pain EXAM: PORTABLE CHEST 1 VIEW COMPARISON:  Chest x-ray performed March 12, 2024 FINDINGS: Numerous leads overlie the patient. Mild interstitial prominence, similar. No significant pleural effusion or pneumothorax. IMPRESSION: 1. Chronic appearing interstitial changes. 2. No pleural effusion or pneumothorax. Electronically Signed   By: Maude Naegeli M.D.   On: 03/28/2024 16:01   CT Angio Chest PE W and/or Wo Contrast Result Date: 03/28/2024 CLINICAL DATA:  Concern for pulmonary edema.  Also sepsis. EXAM: CT ANGIOGRAPHY CHEST CT ABDOMEN AND PELVIS WITH CONTRAST TECHNIQUE: Multidetector CT imaging of the chest was performed using the standard protocol during bolus administration of intravenous  contrast. Multiplanar CT image reconstructions and MIPs were obtained to evaluate the vascular anatomy. Multidetector CT imaging of the abdomen and pelvis was performed using the standard protocol during bolus administration of intravenous  contrast. RADIATION DOSE REDUCTION: This exam was performed according to the departmental dose-optimization program which includes automated exposure control, adjustment of the mA and/or kV according to patient size and/or use of iterative reconstruction technique. CONTRAST:  60mL OMNIPAQUE  IOHEXOL  350 MG/ML SOLN COMPARISON:  Chest radiograph dated 03/28/2024 and CT dated 03/23/2024. FINDINGS: Evaluation of this exam is limited due to respiratory motion and streak artifact caused by patient's arms. CTA CHEST FINDINGS Cardiovascular: There is no cardiomegaly or pericardial effusion. Mild atherosclerotic calcification of the thoracic aorta. No aneurysmal dilatation or dissection. No pulmonary artery embolus identified. Mediastinum/Nodes: There is bilateral hilar and mediastinal adenopathy measuring 12 mm in short axis on the right. Subcarinal lymph node measures approximately 2.2 cm. Overall similar or slightly improved adenopathy compared to prior CT. The esophagus is grossly unremarkable. No mediastinal fluid collection. Lungs/Pleura: Background of emphysema on biapical subpleural scarring. Interval decrease in the size of the right apical subpleural nodule. Chrissa Meetze 1.8 cm subpleural bandlike density in the right apex (22/9) likely residual infiltrate or scarring. Continued follow-up recommended. No new nodule. No consolidative changes. There is no pleural effusion or pneumothorax. The central airways are patent. Musculoskeletal: No acute osseous pathology. Right humeral intramedullary nail. Review of the MIP images confirms the above findings. CT ABDOMEN and PELVIS FINDINGS No intra-abdominal free air or free fluid. Hepatobiliary: The liver is unremarkable. No biliary dilatation. The  gallbladder is unremarkable Pancreas: Unremarkable. No pancreatic ductal dilatation or surrounding inflammatory changes. Spleen: Normal in size without focal abnormality. Adrenals/Urinary Tract: The adrenal glands unremarkable. There is no hydronephrosis on either side. There is symmetric enhancement and excretion of contrast by both kidneys. The visualized ureters and urinary bladder appear unremarkable. Stomach/Bowel: There is no bowel obstruction. Mild diffuse thickened appearance of the colon may be related to underdistention or represent colitis. Clinical correlation is recommended. Several small scattered colonic diverticula. The appendix is normal. Vascular/Lymphatic: Mild aortoiliac atherosclerotic disease. The IVC is unremarkable no portal venous gas. There is no adenopathy. Reproductive: Several calcified uterine fibroids. No suspicious adnexal masses. Other: None Musculoskeletal: Osteopenia with degenerative changes. L5 compression fracture with approximately 40-50% loss of vertebral body height, likely acute or subacute. Clinical correlation recommended. Review of the MIP images confirms the above findings. IMPRESSION: 1. No CT evidence of pulmonary artery embolus. 2. Interval decrease in the size of the right apical subpleural nodule. Zyrah Wiswell 1.8 cm subpleural bandlike density in the right apex likely residual infiltrate or scarring. Continued follow-up recommended. 3. Distension of the colon versus colitis.  No bowel obstruction. 4. Mild colonic diverticulosis. 5. L5 compression fracture, likely acute or subacute. 6. Aortic Atherosclerosis (ICD10-I70.0) and Emphysema (ICD10-J43.9). Electronically Signed   By: Vanetta Chou M.D.   On: 03/28/2024 16:00   CT ABDOMEN PELVIS W CONTRAST Result Date: 03/28/2024 CLINICAL DATA:  Concern for pulmonary edema.  Also sepsis. EXAM: CT ANGIOGRAPHY CHEST CT ABDOMEN AND PELVIS WITH CONTRAST TECHNIQUE: Multidetector CT imaging of the chest was performed using the  standard protocol during bolus administration of intravenous contrast. Multiplanar CT image reconstructions and MIPs were obtained to evaluate the vascular anatomy. Multidetector CT imaging of the abdomen and pelvis was performed using the standard protocol during bolus administration of intravenous contrast. RADIATION DOSE REDUCTION: This exam was performed according to the departmental dose-optimization program which includes automated exposure control, adjustment of the mA and/or kV according to patient size and/or use of iterative reconstruction technique. CONTRAST:  60mL OMNIPAQUE  IOHEXOL  350 MG/ML SOLN COMPARISON:  Chest radiograph dated 03/28/2024 and CT  dated 03/23/2024. FINDINGS: Evaluation of this exam is limited due to respiratory motion and streak artifact caused by patient's arms. CTA CHEST FINDINGS Cardiovascular: There is no cardiomegaly or pericardial effusion. Mild atherosclerotic calcification of the thoracic aorta. No aneurysmal dilatation or dissection. No pulmonary artery embolus identified. Mediastinum/Nodes: There is bilateral hilar and mediastinal adenopathy measuring 12 mm in short axis on the right. Subcarinal lymph node measures approximately 2.2 cm. Overall similar or slightly improved adenopathy compared to prior CT. The esophagus is grossly unremarkable. No mediastinal fluid collection. Lungs/Pleura: Background of emphysema on biapical subpleural scarring. Interval decrease in the size of the right apical subpleural nodule. Mihail Prettyman 1.8 cm subpleural bandlike density in the right apex (22/9) likely residual infiltrate or scarring. Continued follow-up recommended. No new nodule. No consolidative changes. There is no pleural effusion or pneumothorax. The central airways are patent. Musculoskeletal: No acute osseous pathology. Right humeral intramedullary nail. Review of the MIP images confirms the above findings. CT ABDOMEN and PELVIS FINDINGS No intra-abdominal free air or free fluid.  Hepatobiliary: The liver is unremarkable. No biliary dilatation. The gallbladder is unremarkable Pancreas: Unremarkable. No pancreatic ductal dilatation or surrounding inflammatory changes. Spleen: Normal in size without focal abnormality. Adrenals/Urinary Tract: The adrenal glands unremarkable. There is no hydronephrosis on either side. There is symmetric enhancement and excretion of contrast by both kidneys. The visualized ureters and urinary bladder appear unremarkable. Stomach/Bowel: There is no bowel obstruction. Mild diffuse thickened appearance of the colon may be related to underdistention or represent colitis. Clinical correlation is recommended. Several small scattered colonic diverticula. The appendix is normal. Vascular/Lymphatic: Mild aortoiliac atherosclerotic disease. The IVC is unremarkable no portal venous gas. There is no adenopathy. Reproductive: Several calcified uterine fibroids. No suspicious adnexal masses. Other: None Musculoskeletal: Osteopenia with degenerative changes. L5 compression fracture with approximately 40-50% loss of vertebral body height, likely acute or subacute. Clinical correlation recommended. Review of the MIP images confirms the above findings. IMPRESSION: 1. No CT evidence of pulmonary artery embolus. 2. Interval decrease in the size of the right apical subpleural nodule. Oris Staffieri 1.8 cm subpleural bandlike density in the right apex likely residual infiltrate or scarring. Continued follow-up recommended. 3. Distension of the colon versus colitis.  No bowel obstruction. 4. Mild colonic diverticulosis. 5. L5 compression fracture, likely acute or subacute. 6. Aortic Atherosclerosis (ICD10-I70.0) and Emphysema (ICD10-J43.9). Electronically Signed   By: Vanetta Chou M.D.   On: 03/28/2024 16:00   CT Head Wo Contrast Result Date: 03/28/2024 CLINICAL DATA:  Neuro deficit, acute, stroke suspected EXAM: CT HEAD WITHOUT CONTRAST TECHNIQUE: Contiguous axial images were obtained from  the base of the skull through the vertex without intravenous contrast. RADIATION DOSE REDUCTION: This exam was performed according to the departmental dose-optimization program which includes automated exposure control, adjustment of the mA and/or kV according to patient size and/or use of iterative reconstruction technique. COMPARISON:  CT head 03/19/2024, MRI head 02/22/2024 FINDINGS: Brain: Patchy and confluent areas of decreased attenuation are noted throughout the deep and periventricular white matter of the cerebral hemispheres bilaterally, compatible with chronic microvascular ischemic disease. No evidence of large-territorial acute infarction. No parenchymal hemorrhage. Redemonstration of Nella Botsford left frontal lobe 2.5 cm mass with associated marked vasogenic edema leading to grossly stable 4 mm left-to-right midline shift. No extra-axial collection. No hydrocephalus. Basilar cisterns are patent. Vascular: No hyperdense vessel. Skull: No acute fracture or focal lesion.  Prior left craniotomy. Sinuses/Orbits: Paranasal sinuses and mastoid air cells are clear. The orbits are unremarkable. Other: None. IMPRESSION:  Left frontal lobe 2.5 cm mass with associated marked vasogenic edema leading to grossly stable 4 mm left-to-right midline shift. Electronically Signed   By: Morgane  Naveau M.D.   On: 03/28/2024 15:53        Scheduled Meds:  dexamethasone  (DECADRON ) injection  4 mg Intravenous Q12H   levETIRAcetam   1,000 mg Intravenous Q12H   metoprolol  tartrate  25 mg Oral BID   Continuous Infusions:  diltiazem  (CARDIZEM ) infusion Stopped (03/29/24 1800)     LOS: 2 days    Time spent: over 30 min    Meliton Monte, MD [facility]   To contact the attending provider between 7A-7P or the covering provider during after hours 7P-7A, please log into the web site [email or web address] and access using universal [place] password for that web site. If you do not have the password, please call the  hospital operator.  03/30/2024, 2:07 PM

## 2024-03-31 DIAGNOSIS — R569 Unspecified convulsions: Secondary | ICD-10-CM | POA: Diagnosis not present

## 2024-03-31 MED ORDER — HYDRALAZINE HCL 50 MG PO TABS
50.0000 mg | ORAL_TABLET | Freq: Once | ORAL | Status: AC
  Administered 2024-03-31: 50 mg via ORAL
  Filled 2024-03-31: qty 1

## 2024-03-31 MED ORDER — LEVETIRACETAM 250 MG PO TABS
1000.0000 mg | ORAL_TABLET | Freq: Two times a day (BID) | ORAL | Status: DC
  Administered 2024-03-31 – 2024-04-06 (×13): 1000 mg via ORAL
  Filled 2024-03-31 (×13): qty 4

## 2024-03-31 MED ORDER — LOSARTAN POTASSIUM 50 MG PO TABS
25.0000 mg | ORAL_TABLET | Freq: Every day | ORAL | Status: DC
  Administered 2024-03-31 – 2024-04-06 (×7): 25 mg via ORAL
  Filled 2024-03-31 (×7): qty 1

## 2024-03-31 MED ORDER — METOPROLOL TARTRATE 25 MG PO TABS
25.0000 mg | ORAL_TABLET | Freq: Two times a day (BID) | ORAL | Status: DC
  Administered 2024-03-31 (×2): 25 mg via ORAL
  Filled 2024-03-31 (×2): qty 1

## 2024-03-31 NOTE — Progress Notes (Addendum)
 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  She also has Aphasia at baseline, has been poorly compliant with her cancer treatments lately, she is also on Keppra  for seizure prophylaxis however unclear if she is compliant .  This is her third hospitalization in July, early July hospitalized at [facility] long with fluid overload, CHF exacerbation, subsequently admitted 7/14-7/22, with generalized body pains, dyspnea, encephalopathy treated with supportive care and discharged home 2 days ago.  Presented to the emergency room with weakness today, unable to provide much meaningful history, while in the ED she had Nazier Neyhart tonic-clonic seizure was given 1 mg of Ativan , seizure stopped.  Case discussed with neurology and she was loaded with Keppra  .after this she had an episode of SVT, treated with adenosine followed by cardioversion.  Now slowly improving, unable to provide much meaningful history Labs noted sodium of 134, potassium 3.4, BUN less than 5, creatinine 0.5, WBC 10.6, hemoglobin 13, UDS positive for cannabis  - CTA, noted no PE, right apical subpleural nodule, L5 compression fracture, acute versus subacute , Left frontal lobe 2.5 cm mass with associated marked vasogenic edema leading to grossly stable 4 mm left-to-right midline shift.  Assessment & Plan:   Principal Problem:   Seizure (HCC) Active Problems:   Cerebral edema (HCC)   Primary adenocarcinoma of upper lobe of right lung (HCC)   Metastasis to bone (HCC)   COPD (chronic obstructive pulmonary disease) (HCC)   Lung cancer  metastatic to brain [facility])   Aphasia   SVT (supraventricular tachycardia) (HCC)  Goals of Care Appreciate palliative assistance.  They're looking into inpatient hospice facility if eligible. Planning additional GOC conversations today with family.   Seizure [facility]) Brain metastasis Aphasia - admitting provider discussed with Dr. Lindzen, who recommended increasing Keppra  to 1gm BID, they will not follow for now - CT with L frontal lobe 2.5 cm mass with marked vasogenic edema - 4 mm L to R midline shift - decadron  - keppra , increased dose per neurology - She is followed by Dr. [name] and Dr. [name] but has not followed up in [place] while - messaged on secure chat (per secure chat message, Dr. [name] felt hospice reasonable) - appreciate palliative assistance with GOC conversations   SVT Sinus tachy  -Metoprolol  - still tachy, but sinus  -EF 70-75%, hyperdynamic - CT PE protocol with no PE   Hypertension - resume home losartan , continue metoprolol     Primary adenocarcinoma of upper lobe of right lung (HCC)   Metastasis to bone [facility]) - Has not had treatment for this in [place] very long time   Chronic Pain Will start short acting oxycodone , appears had prescription for ER last filled 6/9     COPD (chronic obstructive pulmonary disease) (HCC) -Stable  Distension of colon vs colitis - no abdominal symptoms, monitor for now  L5 compression fracture Noted    DVT prophylaxis: SCD Code Status: discussed today, encouraged continued conversation Family Communication: niece 7/26 and 7/24 Disposition:   Status is: Inpatient Remains inpatient appropriate because: need for ongoing care   Consultants:  Neuro oncology oncology  Procedures:  none  Antimicrobials:  Anti-infectives (From admission, onward)    None       Subjective: Pleasantly confused   Objective: Vitals:   03/31/24 0300 03/31/24 0633 03/31/24 0814 03/31/24 1145  BP: (!) 175/108 (!) 161/102 (!) 158/104 (!)  157/108  Pulse: 92  82 76  Resp: 18  20 18   Temp: 97.9 F (36.6 C)  97.7 F (36.5 C) 97.6 F (36.4 C)  TempSrc: Oral  Oral Oral  SpO2:      Weight:      Height:        Intake/Output Summary (Last 24 hours) at 03/31/2024 1254 Last data filed at 03/31/2024 0900 Gross per 24 hour  Intake 240 ml  Output --  Net 240 ml    Filed Weights   03/28/24 1411  Weight: 55 kg    Examination:  General: No acute distress. Cardiovascular: RRR Lungs: unlabored Neurological: moving all extremities - pleasantly confused Extremities: No clubbing or cyanosis. No edema.  Data Reviewed: I have personally reviewed following labs and imaging studies  CBC: Recent Labs  Lab 03/28/24 1414 03/28/24 1445 03/29/24 1103  WBC 10.6*  --  11.4*  NEUTROABS  --   --  9.2*  HGB 13.8 15.0 14.9  HCT 42.2 44.0 44.4  MCV 86.8  --  83.6  PLT 569*  --  671*    Basic Metabolic Panel: Recent Labs  Lab 03/28/24 1414 03/28/24 1445 03/29/24 0439 03/29/24 1103  NA 134* 134*  --  132*  K 3.2* 3.1*  --  4.3  CL 96* 97*  --  95*  CO2 20*  --   --  22  GLUCOSE 130* 128*  --  163*  BUN <5* <3*  --  9  CREATININE 0.59 0.40*  --  0.57  CALCIUM  9.0  --   --  9.6  MG  --   --  2.0 1.9  PHOS  --   --   --  4.1    GFR: Estimated Creatinine Clearance: 64.9 mL/min (by C-G formula based on SCr of 0.57 mg/dL).  Liver Function Tests: Recent Labs  Lab 03/28/24 1414 03/29/24 1103  AST 24 19  ALT 9 11  ALKPHOS 168* 167*  BILITOT 0.9 0.8  PROT 6.9 7.4  ALBUMIN 3.0* 3.2*    CBG: Recent Labs  Lab 03/28/24 1430  GLUCAP 132*     Recent Results (from the past 240 hours)  Culture, blood (routine x 2)     Status: None (Preliminary result)   Collection Time: 03/28/24  5:00 PM   Specimen: BLOOD RIGHT HAND  Result Value Ref Range Status   Specimen Description BLOOD RIGHT HAND  Final   Special Requests   Final    BOTTLES DRAWN AEROBIC ONLY Blood Culture results may not be optimal due to an inadequate  volume of blood received in culture bottles   Culture   Final    NO GROWTH 2 DAYS Performed at [facility], [address] [place], [place] [address]    Report Status PENDING  Incomplete  Culture, blood (routine x 2)     Status: None (Preliminary result)   Collection Time: 03/28/24  5:08 PM   Specimen: BLOOD RIGHT HAND  Result Value Ref Range Status   Specimen Description BLOOD RIGHT HAND  Final   Special Requests   Final    BOTTLES DRAWN AEROBIC ONLY Blood Culture results may not be optimal due to an inadequate volume of blood  received in culture bottles   Culture   Final    NO GROWTH 2 DAYS Performed at [facility], [address], [place], [place] [address]    Report Status PENDING  Incomplete         Radiology Studies: No results found.       Scheduled Meds:  dexamethasone  (DECADRON ) injection  4 mg Intravenous Q12H   levETIRAcetam   1,000 mg Oral BID   metoprolol  tartrate  25 mg Oral BID   Continuous Infusions:     LOS: 3 days    Time spent: over 30 min    Meliton Monte, MD [facility]   To contact the attending provider between 7A-7P or the covering provider during after hours 7P-7A, please log into the web site [email or web address] and access using universal [place] password for that web site. If you do not have the password, please call the hospital operator.  03/31/2024, 12:54 PM

## 2024-03-31 NOTE — Progress Notes (Signed)
 Palliative Medicine Progress Note   Patient Name: Regina [name]       Date: 03/31/2024 DOB: [birth year]  Age: 58 y.o. MRN#: [id number] Attending Physician: Perri DELENA Meliton Mickey., * Primary Care Physician: Celestia Rosaline SQUIBB, NP Admit Date: 03/28/2024  Reason for Consultation/Follow-up: {Reason for Consult:23484}  HPI/Patient Profile: 58 y.o. 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  She presented to the ED on 03/28/2024 with weakness and was unable to provide much meaningful history. While in the ED, she had a tonic clonic seizure, and was given 1 mg of Ativan  and loaded with Keppra .  She also had an episode of SVT, treated with adenosine followed by cardioversion. Imaging showed no PE, right apical subpleural nodule, L5 compression fracture (acute versus subacute), frontal lobe 2.5 cm mass with associated marked vasogenic edema and grossly stable 4 mm left-to-right midline shift.   Palliative Medicine has been consulted for goals of care discussions. Patient and family are faced with anticipatory care needs and complex medical decision making.   Subjective: ***  Objective:  Physical Exam          Vital Signs: BP (!) 157/108 (BP Location: Left Arm)   Pulse 76   Temp 97.6 F (36.4 C) (Oral)   Resp 18   Ht 5' 6 (1.676 m)   Wt 55 kg   LMP 06/06/2010   SpO2 92%   BMI 19.57 kg/m  SpO2: SpO2: 92 % O2 Device: O2 Device: Room Air O2 Flow Rate:    Intake/output summary:  Intake/Output Summary (Last 24 hours) at 03/31/2024 1326 Last  data filed at 03/31/2024 0900 Gross per 24 hour  Intake 240 ml  Output --  Net 240 ml    LBM: Last BM Date : 03/31/24     Palliative Assessment/Data: ***     Palliative Medicine Assessment & Plan   Assessment: Principal Problem:   Seizure (HCC) Active Problems:   COPD (chronic obstructive pulmonary disease) (HCC)   Primary adenocarcinoma of upper lobe of right lung (HCC)   Cerebral edema (HCC)   Metastasis to bone (HCC)   Lung cancer metastatic to brain (  HCC)   Aphasia   SVT (supraventricular tachycardia) (HCC)    Recommendations/Plan: ***  Goals of Care and Additional Recommendations: Limitations on Scope of Treatment: {Recommended Scope and Preferences:21019}  Code Status:   Prognosis:  {Palliative Care Prognosis:23504}  Discharge Planning: {Palliative dispostion:23505}  Care plan was discussed with ***  Thank you for allowing the Palliative Medicine Team to assist in the care of this patient.   ***   Recardo KATHEE Loll, NP   Please contact Palliative Medicine Team phone at [phone] for questions and concerns.  For individual providers, please see AMION.

## 2024-03-31 NOTE — Progress Notes (Signed)
 Patient BP 183/123 after administration of hydralazine  50 mg PO. MD notified. Awaiting response from MD. Patient is asymptomatic. No acute adverse change in patient condition. Will continue to monitor.

## 2024-03-31 NOTE — Progress Notes (Signed)
 On assessment, patient is extremely confused and forgetful. Patient is alert. Disoriented x 4. Speech is clear but nonsensical. Patient is irritable. Patient c/o headache. Patient stated that she did not eat her dinner. No appetite. Change in LOC, per report from day shift nurse assessment, patient was A&O x4. Patient is now alert and disoriented x4. Patient BP elevated at 189/113 at 2145. Opyd MD notified @ 2159. No new orders given. BP medicine administered at 2223. Patient BP at 0015 194/116. Patient BP has continually increased since beginning of shift. According to VS assessment, BP had been elevated during day shift on 03/30/2024. Metoprolol  is not effective at this time to lower BP. Opyd MD notified. Awaiting response from MD. No acute adverse change in patient condition. Will continue to monitor.

## 2024-04-01 ENCOUNTER — Inpatient Hospital Stay (HOSPITAL_COMMUNITY): Payer: MEDICAID

## 2024-04-01 DIAGNOSIS — R569 Unspecified convulsions: Secondary | ICD-10-CM | POA: Diagnosis not present

## 2024-04-01 LAB — BASIC METABOLIC PANEL WITH GFR
Anion gap: 12 (ref 5–15)
BUN: 23 mg/dL — ABNORMAL HIGH (ref 6–20)
CO2: 23 mmol/L (ref 22–32)
Calcium: 9.6 mg/dL (ref 8.9–10.3)
Chloride: 98 mmol/L (ref 98–111)
Creatinine, Ser: 0.84 mg/dL (ref 0.44–1.00)
GFR, Estimated: >60 mL/min (ref >60)
Glucose, Bld: 136 mg/dL — ABNORMAL HIGH (ref 70–99)
Potassium: 4.1 mmol/L (ref 3.5–5.1)
Sodium: 133 mmol/L — ABNORMAL LOW (ref 135–145)

## 2024-04-01 LAB — CBC
HCT: 50.8 % — ABNORMAL HIGH (ref 36.0–46.0)
Hemoglobin: 17 g/dL — ABNORMAL HIGH (ref 12.0–15.0)
MCH: 28.4 pg (ref 26.0–34.0)
MCHC: 33.5 g/dL (ref 30.0–36.0)
MCV: 84.8 fL (ref 80.0–100.0)
Platelets: 683 K/uL — ABNORMAL HIGH (ref 150–400)
RBC: 5.99 MIL/uL — ABNORMAL HIGH (ref 3.87–5.11)
RDW: 17.8 % — ABNORMAL HIGH (ref 11.5–15.5)
WBC: 12.1 K/uL — ABNORMAL HIGH (ref 4.0–10.5)
nRBC: 0 % (ref 0.0–0.2)

## 2024-04-01 LAB — PHOSPHORUS: Phosphorus: 3.2 mg/dL (ref 2.5–4.6)

## 2024-04-01 LAB — MAGNESIUM: Magnesium: 2.4 mg/dL (ref 1.7–2.4)

## 2024-04-01 MED ORDER — DICLOFENAC SODIUM 1 % EX GEL
2.0000 g | Freq: Four times a day (QID) | CUTANEOUS | Status: DC
  Administered 2024-04-01 – 2024-04-06 (×17): 2 g via TOPICAL
  Filled 2024-04-01: qty 100

## 2024-04-01 MED ORDER — METOPROLOL TARTRATE 50 MG PO TABS
50.0000 mg | ORAL_TABLET | Freq: Two times a day (BID) | ORAL | Status: DC
  Administered 2024-04-01 – 2024-04-06 (×11): 50 mg via ORAL
  Filled 2024-04-01 (×11): qty 1

## 2024-04-01 MED ORDER — HYDRALAZINE HCL 50 MG PO TABS
50.0000 mg | ORAL_TABLET | Freq: Once | ORAL | Status: AC
  Administered 2024-04-01: 50 mg via ORAL
  Filled 2024-04-01: qty 1

## 2024-04-01 NOTE — Progress Notes (Signed)
 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  She also has Aphasia at baseline, has been poorly compliant with her cancer treatments lately, she is also on Keppra  for seizure prophylaxis however unclear if she is compliant .  This is her third hospitalization in July, early July hospitalized at [facility] long with fluid overload, CHF exacerbation, subsequently admitted 7/14-7/22, with generalized body pains, dyspnea, encephalopathy treated with supportive care and discharged home 2 days ago.  Presented to the emergency room with weakness today, unable to provide much meaningful history, while in the ED she had Hildred Pharo tonic-clonic seizure was given 1 mg of Ativan , seizure stopped.  Case discussed with neurology and she was loaded with Keppra  .after this she had an episode of SVT, treated with adenosine followed by cardioversion.  Now slowly improving, unable to provide much meaningful history Labs noted sodium of 134, potassium 3.4, BUN less than 5, creatinine 0.5, WBC 10.6, hemoglobin 13, UDS positive for cannabis  - CTA, noted no PE, right apical subpleural nodule, L5 compression fracture, acute versus subacute , Left frontal lobe 2.5 cm mass with associated marked vasogenic edema leading to grossly stable 4 mm left-to-right midline shift.  Assessment & Plan:   Principal Problem:   Seizure (HCC) Active Problems:   Cerebral edema (HCC)   Primary adenocarcinoma of upper lobe of right lung (HCC)   Metastasis to bone (HCC)   COPD (chronic obstructive pulmonary disease) (HCC)   Lung cancer  metastatic to brain [facility])   Aphasia   SVT (supraventricular tachycardia) (HCC)  Goals of Care Appreciate palliative assistance.  They're looking into inpatient hospice facility if eligible. Planning additional GOC conversations today with family.   Seizure [facility]) Brain metastasis Aphasia Cognitive Deficit  - admitting provider discussed with Dr. Lindzen, who recommended increasing Keppra  to 1gm BID, they will not follow for now - has significant aphasia - difficult to tell how much she understands related to her aphasia, occasionally appropriate, suspect she may have some underlying cognitive deficit as well though difficult to parse   - CT with L frontal lobe 2.5 cm mass with marked vasogenic edema - 4 mm L to R midline shift - decadron  - keppra , increased dose per neurology - She is followed by Dr. [name] and Dr. [name] but has not followed up in [place] while - messaged on secure chat (per secure chat message, Dr. [name] felt hospice reasonable) - appreciate palliative assistance with GOC conversations   SVT Sinus tachy - SVT this AM -> resolved prior to EKG being taken  -Metoprolol  - will increase to 50 mg BID - still tachy, but sinus  -EF 70-75%, hyperdynamic - CT PE protocol with no PE   Hypertension - resume home losartan , continue metoprolol  - had elevated BP's overnight, 190's systolic - got dose of hydralazine  - monitor with resumed losartan /increased metop - doesn't appear she was symptomatic     Primary adenocarcinoma of upper lobe of right lung (HCC)   Metastasis to bone (HCC) - Has not had treatment for this in [place] very long time   Chronic Pain Will start short acting oxycodone ,  appears had prescription for ER last filled 6/9    COPD (chronic obstructive pulmonary disease) (HCC) -Stable  Distension of colon vs colitis - no abdominal symptoms, monitor for now  L5 compression fracture Noted    DVT prophylaxis: SCD Code Status: discussed today, encouraged  continued conversation Family Communication: niece 7/26 and 7/24 Disposition:   Status is: Inpatient Remains inpatient appropriate because: need for ongoing care   Consultants:  Neuro oncology oncology  Procedures:  none  Antimicrobials:  Anti-infectives (From admission, onward)    None       Subjective: C/o L ankle pain Difficult to understand with her aphasia  Objective: Vitals:   04/01/24 0045 04/01/24 0155 04/01/24 0400 04/01/24 0817  BP: (!) 198/130 (!) 192/120  (!) 136/99  Pulse: 92  87 89  Resp: 15   18  Temp: 98.2 F (36.8 C)  98 F (36.7 C) (!) 97.5 F (36.4 C)  TempSrc: Oral  Oral Oral  SpO2: 100%   97%  Weight:      Height:       No intake or output data in the 24 hours ending 04/01/24 1015   Filed Weights   03/28/24 1411  Weight: 55 kg    Examination:  General: No acute distress. Cardiovascular: RR Lungs: unlabored Neurological: alert, aphasia, difficult to understand at times - seems pleasantly confused Extremities: TTP to L ankle, no swelling or redness  Data Reviewed: I have personally reviewed following labs and imaging studies  CBC: Recent Labs  Lab 03/28/24 1414 03/28/24 1445 03/29/24 1103  WBC 10.6*  --  11.4*  NEUTROABS  --   --  9.2*  HGB 13.8 15.0 14.9  HCT 42.2 44.0 44.4  MCV 86.8  --  83.6  PLT 569*  --  671*    Basic Metabolic Panel: Recent Labs  Lab 03/28/24 1414 03/28/24 1445 03/29/24 0439 03/29/24 1103  NA 134* 134*  --  132*  K 3.2* 3.1*  --  4.3  CL 96* 97*  --  95*  CO2 20*  --   --  22  GLUCOSE 130* 128*  --  163*  BUN <5* <3*  --  9  CREATININE 0.59 0.40*  --  0.57  CALCIUM  9.0  --   --  9.6  MG  --   --  2.0 1.9  PHOS  --   --   --  4.1    GFR: Estimated Creatinine Clearance: 64.9 mL/min (by C-G formula based on SCr of 0.57 mg/dL).  Liver Function Tests: Recent Labs  Lab 03/28/24 1414 03/29/24 1103  AST 24 19  ALT 9 11  ALKPHOS 168* 167*  BILITOT 0.9 0.8  PROT 6.9 7.4  ALBUMIN  3.0* 3.2*    CBG: Recent Labs  Lab 03/28/24 1430  GLUCAP 132*     Recent Results (from the past 240 hours)  Culture, blood (routine x 2)     Status: None (Preliminary result)   Collection Time: 03/28/24  5:00 PM   Specimen: BLOOD RIGHT HAND  Result Value Ref Range Status   Specimen Description BLOOD RIGHT HAND  Final   Special Requests   Final    BOTTLES DRAWN AEROBIC ONLY Blood Culture results may not be optimal due to an inadequate volume of blood received in culture bottles   Culture   Final    NO GROWTH 4 DAYS Performed at [facility], [address] [place], [place] [address]    Report Status  PENDING  Incomplete  Culture, blood (routine x 2)     Status: None (Preliminary result)   Collection Time: 03/28/24  5:08 PM   Specimen: BLOOD RIGHT HAND  Result Value Ref Range Status   Specimen Description BLOOD RIGHT HAND  Final   Special Requests   Final    BOTTLES DRAWN AEROBIC ONLY Blood Culture results may not be optimal due to an inadequate volume of blood received in culture bottles   Culture   Final    NO GROWTH 4 DAYS Performed at [facility], [address], [place], [place] [address]    Report Status PENDING  Incomplete         Radiology Studies: No results found.       Scheduled Meds:  dexamethasone  (DECADRON ) injection  4 mg Intravenous Q12H   levETIRAcetam   1,000 mg Oral BID   losartan   25 mg Oral Daily   metoprolol  tartrate  25 mg Oral BID   Continuous Infusions:     LOS: 4 days    Time spent: over 30 min    Meliton Monte, MD [facility]   To contact the attending provider between 7A-7P or the covering provider during after hours 7P-7A, please log into the web site [email or web address] and access using universal [place] password for that web site. If you do not have the password, please call the hospital operator.  04/01/2024, 10:15 AM

## 2024-04-01 NOTE — Progress Notes (Signed)
 Patient neurological functioning has decreased. Patient has expressive aphasia. On Friday, March 30, 2024, 7P-7A shift patient was able to communicate with this nurse with clear speech. Today her speech is incoherent. Patient sustained SVT 160 HR for 8 min. Stat EKG performed. Heart rate quickly drop from 140s to below 100 beats/min before EKG was taken. Patient HR has been WDL since SVT resolved on its own. SVT. Patient was asymptomatic. MD notified. No response.  Patient BP was elevated again >150 diastolic; > 100 systolic. MD notified. Metoprolol  administered and is not effective for the second night in a row. Hydralazine  50mg  was administered and was somewhat effective. Patient would benefit from hydralazine  at a higher dose. Last check BP was 144/91. No adverse change in patient condition. Will continue to monitor.

## 2024-04-01 NOTE — Progress Notes (Signed)
   04/01/24 0910  Mobility  Activity Stood at bedside  Level of Assistance Contact guard assist, steadying assist  Assistive Device Front wheel walker  RUE Weight Bearing Per Provider Order  (No Order)  Activity Response Tolerated fair  Mobility Referral Yes  Mobility visit 1 Mobility  Mobility Specialist Start Time (ACUTE ONLY) 0910  Mobility Specialist Stop Time (ACUTE ONLY) 0934  Mobility Specialist Time Calculation (min) (ACUTE ONLY) 24 min   Mobility Specialist: Progress Note  Pre-Mobility:      HR 96, SpO2 97%, BP 127/80 During Mobility: HR 103,                   BP 97/58 Post-Mobility:    HR 90, SpO2 99%, BP 136/88  Pt agreeable to mobility session - received in bed. C/o L arm pain rated 10/10, pain in feet, and increasing dizziness. Returned to bed with all needs met - call bell within reach. Bed alarm on.   Virgle Boards, BS Mobility Specialist Please contact via SecureChat or  Rehab office at [phone].

## 2024-04-01 NOTE — Progress Notes (Signed)
 Palliative Medicine Progress Note   Patient Name: Regina [name]       Date: 04/01/2024 DOB: 11/29/1965  Age: 58 y.o. MRN#: [id number] Attending Physician: Perri DELENA Meliton Mickey., * Primary Care Physician: Celestia Rosaline SQUIBB, NP Admit Date: 03/28/2024   HPI/Patient Profile: 58 y.o. 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  She presented to the ED on 03/28/2024 with weakness and was unable to provide much meaningful history. While in the ED, she had a tonic clonic seizure, and was given 1 mg of Ativan  and loaded with Keppra .  She also had an episode of SVT, treated with adenosine followed by cardioversion. Imaging showed no PE, right apical subpleural nodule, L5 compression fracture (acute versus subacute), frontal lobe 2.5 cm mass with associated marked vasogenic edema and grossly stable 4 mm left-to-right midline shift.   Palliative Medicine has been consulted for goals of care discussions. Patient and family are faced with anticipatory care needs and complex medical decision making.   Subjective: Chart reviewed.  Update received from RN.  Patient assessed at bedside.  She is more lucid today compared to my previous visits.  Her Regina intake remains minimal.  Her lunch tray remains at bedside and appears mostly untouched.  Patient states she understands her current medical situation, and is agreeable to referral to a hospice facility.  She is quite tearful, at one point stating I am only 58 years old. Emotional support provided.  I later spoke  with  with her niece by phone.  Niece is agreeable to referral to inpatient hospice, but request time to decide between [facility] versus [facility] - she states that she will make a decision by tomorrow.  Discussed plan to continue current supportive interventions for now until disposition is determined.  Discussed that if patient is not eligible for inpatient hospice, options would be home with hospice versus SNF/rehab.  Although home with hospice would not be ideal, niece is more open to this idea compared to during our initial consult.   Objective:  Physical Exam Vitals reviewed.  Constitutional:      General: She is not in  acute distress.    Appearance: She is ill-appearing.  Pulmonary:     Effort: Pulmonary effort is normal.  Neurological:     Mental Status: She is alert.     Motor: Weakness present.     Comments: aphasia            Palliative Medicine Assessment & Plan   Assessment: Principal Problem:   Seizure (HCC) Active Problems:   COPD (chronic obstructive pulmonary disease) (HCC)   Primary adenocarcinoma of upper lobe of right lung (HCC)   Cerebral edema (HCC)   Metastasis to bone (HCC)   Lung cancer metastatic to brain (HCC)   Aphasia   SVT (supraventricular tachycardia) (HCC)    Recommendations/Plan: Continue  current supportive interventions Referral to inpatient hospice - niece considering [place] versus [facility] and will have a decision by tomorrow Niece understands steroids and antiseizure medications would be discontinued on transfer to inpatient hospice PMT will continue to follow  Code Status: DNR - Limited   Prognosis:  Overall poor  Discharge Planning: To Be Determined  Care plan was discussed with Dr. Perri  Thank you for allowing the Palliative Medicine Team to assist in the care of this patient.   Time: 52 minutes   Recardo KATHEE Loll, NP   Please contact Palliative Medicine Team phone at [phone] for questions and  concerns.  For individual providers, please see AMION.

## 2024-04-02 ENCOUNTER — Ambulatory Visit: Payer: MEDICAID | Admitting: Physician Assistant

## 2024-04-02 ENCOUNTER — Ambulatory Visit: Payer: MEDICAID

## 2024-04-02 ENCOUNTER — Inpatient Hospital Stay (HOSPITAL_COMMUNITY): Payer: MEDICAID

## 2024-04-02 ENCOUNTER — Inpatient Hospital Stay: Payer: MEDICAID

## 2024-04-02 ENCOUNTER — Inpatient Hospital Stay: Payer: MEDICAID | Attending: Internal Medicine

## 2024-04-02 LAB — CULTURE, BLOOD (ROUTINE X 2)
Culture: NO GROWTH
Culture: NO GROWTH

## 2024-04-02 MED ORDER — OXYCODONE HCL ER 10 MG PO T12A
10.0000 mg | EXTENDED_RELEASE_TABLET | Freq: Two times a day (BID) | ORAL | Status: DC
  Administered 2024-04-02 – 2024-04-05 (×7): 10 mg via ORAL
  Filled 2024-04-02 (×7): qty 1

## 2024-04-02 NOTE — Plan of Care (Signed)
 Patient BP has improved with increase in metoprolol  dose. During the night there was no need to administer hydralazine  for BP control as opposed to Friday, March 30, 2024, Saturday, March 31, 2024. Patient has expressive aphasia. With lowered BP patient cognitive ability has improved. Level of understanding and responses to questions has improved. Some parts of patient's speech continues to be incoherent.  Patient refuses SCDs. No adverse changes in condition. Will continue to monitor.

## 2024-04-02 NOTE — Plan of Care (Signed)

## 2024-04-02 NOTE — Progress Notes (Signed)
 Patient BP has improved with increase in metoprolol  dose. During the night there was no need to administer hydralazine  for BP control as opposed to Friday, March 30, 2024, Saturday, March 31, 2024. Patient has expressive aphasia. With lowered BP patient cognitive ability has improved. Level of understanding and responses to questions has improved. Some parts of patient's speech continues to be incoherent.  Patient refuses SCDs. No adverse changes in condition. Will continue to monitor.

## 2024-04-02 NOTE — Progress Notes (Signed)
 [facility] [phone]  [facility]  Hospice hospital liaison note   Received request from [facility] for family interest in hospice inpatient unit. Went to the bedside to see how patient is doing, She was walking with walker with PT.    Chart reviewed by hospice physician and at this time patient does not meet criteria for hospice inpatient unit.    She is appropriate for hospice services in the home or LTC facility and we would be happy to reassess for inpatient unit appropriateness at a later time if requested.    Thank you for the opportunity to participate in this patient's care.    Greig Basket BSN, RN Hospice hospital liaison [phone]

## 2024-04-02 NOTE — TOC Progression Note (Addendum)
 Transition of Care [facility]) - Progression Note    Patient Details  Name: Regina [name] MRN: [id number] Date of Birth: Jan 05, 1966  Transition of Care [facility] CM/SW Contact  Inocente GORMAN Kindle, LCSW Phone Number: 04/02/2024, 12:40 PM  Clinical Narrative:    12:40pmCSW spoke with patient's niece. Family has decided on [facility] as their first choice. CSW sent referral for review.    1:47 PM-Per [facility], patient was up walking with therapy so does not qualify for facility Hospice at this time. CSW requested [facility] evaluate as well. Will discuss with niece.   Expected Discharge Plan: Hospice Medical Facility Barriers to Discharge: Hospice Bed not available               Expected Discharge Plan and Services In-house Referral: Clinical Social Work, Hospice / Palliative Care     Living arrangements for the past 2 months: Apartment                                       Social Drivers of Health (SDOH) Interventions SDOH Screenings   Food Insecurity: No Food Insecurity (03/29/2024)  Recent Concern: Food Insecurity - Food Insecurity Present (01/16/2024)  Housing: Low Risk  (03/29/2024)  Recent Concern: Housing - High Risk (03/09/2024)  Transportation Needs: No Transportation Needs (03/29/2024)  Utilities: Not At Risk (03/29/2024)  Recent Concern: Utilities - At Risk (03/09/2024)  Depression (PHQ2-9): Low Risk  (01/16/2024)  Social Connections: Unknown (03/29/2024)  Tobacco Use: Medium Risk (03/12/2024)    Readmission Risk Interventions    03/29/2024    3:29 PM 02/07/2024    3:53 PM 11/24/2023    4:23 PM  Readmission Risk Prevention Plan  Transportation Screening Complete Complete Complete  Medication Review [facility]) Complete Referral to Pharmacy Complete  PCP or Specialist appointment within 3-5 days of discharge Complete Complete Complete  HRI or Home Care Consult Complete Complete Complete  SW Recovery Care/Counseling Consult Complete Complete  Complete  Palliative Care Screening Complete Complete Complete  Skilled Nursing Facility Not Applicable Not Applicable Not Applicable

## 2024-04-02 NOTE — Progress Notes (Signed)
 Speech Language Pathology Treatment: Dysphagia  Patient Details Name: Regina [name] MRN: [id number] DOB: 02-05-1966 Today's Date: 04/02/2024 Time: 8562-8554 SLP Time Calculation (min) (ACUTE ONLY): 8 min  Assessment / Plan / Recommendation Clinical Impression  Pt was seen for f/u with some documentation of intake noted in the chart. She denies any difficulty, and there is no overt dysphagia noted during PO trials today consisting of regular solids and thin liquids. Education was given about regular vs mechanical soft diet textures. Pt indicates that she likes the mechanical soft diet and prefers for her food to come already cut up (SLP asked in multiple ways, and with teach back given aphasia). SLP will therefore leave on mechanical soft (Dys 3) diet and thin liquids. Note that she would be fine to advance to regular solids if her preference changes (MD can advance). SLP to sign off.    HPI HPI: Pt is a 58 yo female presenting 7/23 with AMS and weakness. She had tonic-clonic seizure in the ED. Pt had two recent hospitalizations earlier in July [facility] for fluid overload/CHF exacerbation; outside hospital with generalized body pains, dyspnea,  encephalopathy, and had clinical swallow eval this admission that was [facility]). 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 CCC-SLP Acute Rehabilitation Services Office: [phone]  Secure chat preferred   04/02/2024, 3:00 PM

## 2024-04-02 NOTE — Progress Notes (Signed)
 Occupational Therapy Treatment Patient Details Name: Regina [name] MRN: [id number] DOB: 11-Apr-1966 Today's Date: 04/02/2024   History of present illness Pt is a 58 y/o F admitted on 03/28/24 after presenting with c/o weakness. While in ED, pt had tonic-clonic seizure, later an episode of SVT. Pt noted to have acute vs subacute L5 compression fx, [name] frontal lobe mass leading to 4mm [name] midline shift. This is pt's 3rd hospital admission this month. PMH: non-small cell lung CA with bone & brain metastases, s/p brain SRS, craniotomy 1/25, radiation therapy for pathological fx of humerus & calvarial metastases, COPD, HTN, anxiety, aphasia, seizures   OT comments  Patient received in supine and agreeable to OT treatment. Patient was supervision to get to EOB and CGA to ambulate to sink. Patient performed self care seated/stadning at sink while on 2 liters O2 with SpO2 98% and HR increased to 121 while standing. Patient in recliner at end of session and provided setup for breakfast. Discharge recommendations continue to be appropriate. Acute OT to continue to follow to address established goals.       If plan is discharge home, recommend the following:  A little help with bathing/dressing/bathroom;Supervision due to cognitive status   Equipment Recommendations  None recommended by OT (pt has rec DME)    Recommendations for Other Services      Precautions / Restrictions Precautions Precautions: Fall Recall of Precautions/Restrictions: Intact Precaution/Restrictions Comments: hx of aphasia Restrictions Weight Bearing Restrictions Per Provider Order: No Other Position/Activity Restrictions: 2/6 note indicates pt can use RUE for AROM/AAROM to tolerance. Can strengthen as she can tolerate no formal WB restrictions.       Mobility Bed Mobility Overal bed mobility: Needs Assistance Bed Mobility: Supine to Sit     Supine to sit: Supervision, Used rails     General bed mobility comments: cues  for sequencing    Transfers Overall transfer level: Needs assistance Equipment used: Rolling walker (2 wheels) Transfers: Sit to/from Stand Sit to Stand: Contact guard assist           General transfer comment: CGA to power up and for mobility to sink     Balance Overall balance assessment: Needs assistance Sitting-balance support: Feet supported, No upper extremity supported Sitting balance-Leahy Scale: Fair Sitting balance - Comments: EOB   Standing balance support: Single extremity supported, Bilateral upper extremity supported, During functional activity Standing balance-Leahy Scale: Fair Standing balance comment: stood at sink for self care                           ADL either performed or assessed with clinical judgement   ADL Overall ADL's : Needs assistance/impaired Eating/Feeding: Set up;Sitting   Grooming: Wash/dry hands;Wash/dry face;Oral care;Contact guard assist;Standing   Upper Body Bathing: Contact guard assist;Standing   Lower Body Bathing: Contact guard assist;Sit to/from stand   Upper Body Dressing : Sitting;Minimal assistance Upper Body Dressing Details (indicate cue type and reason): gown Lower Body Dressing: Sitting/lateral leans;Set up Lower Body Dressing Details (indicate cue type and reason): doff/don socks Toilet Transfer: Contact guard assist;Rolling walker (2 wheels);Ambulation Toilet Transfer Details (indicate cue type and reason): simulated         Functional mobility during ADLs: Contact guard assist;Rolling walker (2 wheels)      Extremity/Trunk Assessment Upper Extremity Assessment RUE Deficits / Details: AROM shoulder flex 20-30 deg. elbow flex AROM ~110 deg. Pain with any further PROM. RUE: Shoulder pain with ROM  Vision       Perception     Praxis     Communication Communication Communication: Impaired Factors Affecting Communication: Reduced clarity of speech;Difficulty expressing self;Other  (comment) (hx of aphasia)   Cognition Arousal: Alert Behavior During Therapy: WFL for tasks assessed/performed Cognition: Difficult to assess, No family/caregiver present to determine baseline Difficult to assess due to: Impaired communication           OT - Cognition Comments: increased time to process                 Following commands: Impaired Following commands impaired: Follows one step commands with increased time      Cueing   Cueing Techniques: Verbal cues, Gestural cues  Exercises      Shoulder Instructions       General Comments No complaints of dizziness, BP on EOB 138/93 (105), SpO2 98% on 2 liters, HR 121 om standing, BP at end of session in sitting 140/102 (109), HR 101    Pertinent Vitals/ Pain       Pain Assessment Pain Assessment: 0-10 Faces Pain Scale: Hurts a little bit Pain Location: generalized Pain Descriptors / Indicators: Grimacing, Discomfort Pain Intervention(s): Monitored during session, Repositioned  Home Living                                          Prior Functioning/Environment              Frequency  Min 2X/week        Progress Toward Goals  OT Goals(current goals can now be found in the care plan section)  Progress towards OT goals: Progressing toward goals  Acute Rehab OT Goals Patient Stated Goal: none stated OT Goal Formulation: With patient Time For Goal Achievement: 04/12/24 Potential to Achieve Goals: Good ADL Goals Pt Will Perform Grooming: with supervision;standing Pt Will Perform Lower Body Bathing: with set-up;sitting/lateral leans Pt Will Perform Lower Body Dressing: with supervision;sit to/from stand Pt Will Transfer to Toilet: with supervision;ambulating Pt Will Perform Tub/Shower Transfer: shower seat;Tub transfer;with supervision  Plan      Co-evaluation                 AM-PAC OT 6 Clicks Daily Activity     Outcome Measure   Help from another person eating  meals?: A Little Help from another person taking care of personal grooming?: A Little Help from another person toileting, which includes using toliet, bedpan, or urinal?: A Little Help from another person bathing (including washing, rinsing, drying)?: A Little Help from another person to put on and taking off regular upper body clothing?: A Little Help from another person to put on and taking off regular lower body clothing?: A Little 6 Click Score: 18    End of Session Equipment Utilized During Treatment: Gait belt;Rolling walker (2 wheels)  OT Visit Diagnosis: Unsteadiness on feet (R26.81);Muscle weakness (generalized) (M62.81);Pain Pain - Right/Left: Left Pain - part of body: Leg   Activity Tolerance Patient tolerated treatment well   Patient Left in chair;with call bell/phone within reach;with chair alarm set   Nurse Communication Mobility status        Time: 0721-0749 OT Time Calculation (min): 28 min  Charges: OT General Charges $OT Visit: 1 Visit OT Treatments $Self Care/Home Management : 23-37 mins  Dick Laine, OTA Acute Rehabilitation Services  Office [phone]   Regina [name]  Regina [name] 04/02/2024, 11:11 AM

## 2024-04-02 NOTE — Progress Notes (Signed)
 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  She also has Aphasia at baseline, has been poorly compliant with her cancer treatments lately, she is also on Keppra  for seizure prophylaxis however unclear if she is compliant .  This is her third hospitalization in July, early July hospitalized at [facility] long with fluid overload, CHF exacerbation, subsequently admitted 7/14-7/22, with generalized body pains, dyspnea, encephalopathy treated with supportive care and discharged home 2 days ago.  Presented to the emergency room with weakness today, unable to provide much meaningful history, while in the ED she had Jacquese Cassarino tonic-clonic seizure was given 1 mg of Ativan , seizure stopped.  Case discussed with neurology and she was loaded with Keppra  .after this she had an episode of SVT, treated with adenosine followed by cardioversion.  Now slowly improving, unable to provide much meaningful history Labs noted sodium of 134, potassium 3.4, BUN less than 5, creatinine 0.5, WBC 10.6, hemoglobin 13, UDS positive for cannabis  - CTA, noted no PE, right apical subpleural nodule, L5 compression fracture, acute versus subacute , Left frontal lobe 2.5 cm mass with associated marked vasogenic edema leading to grossly stable 4 mm left-to-right midline shift.  Assessment & Plan:   Principal Problem:   Seizure (HCC) Active Problems:   Cerebral edema (HCC)   Primary adenocarcinoma of upper lobe of right lung (HCC)   Metastasis to bone (HCC)   COPD (chronic obstructive pulmonary disease) (HCC)   Lung cancer  metastatic to brain [facility])   Aphasia   SVT (supraventricular tachycardia) (HCC)  Goals of Care Appreciate palliative assistance.  They're looking into inpatient hospice facility if eligible. Planning additional GOC conversations today with family.   Seizure [facility]) Brain metastasis Aphasia Cognitive Deficit  - admitting provider discussed with Dr. Merrianne, who recommended increasing Keppra  to 1gm BID, they will not follow for now - has significant aphasia - difficult to tell how much she understands related to her aphasia, occasionally appropriate, suspect she may have some underlying cognitive deficit as well though difficult to parse sometimes.  She's been appropriate today and demonstrates understanding of her admission and our plans for hospice.  - CT with L frontal lobe 2.5 cm mass with marked vasogenic edema - 4 mm L to R midline shift - decadron  - keppra , increased dose per neurology - She is followed by Dr. [name] and Dr. [name] but has not followed up in Yazmin Locher while - messaged on secure chat (per secure chat message, Dr. [name] felt hospice reasonable) - appreciate palliative assistance with GOC conversations   SVT Sinus tachy - SVT 7/27 am -> resolved prior to EKG being taken  -Metoprolol  - will increase to 50 mg BID - intermittently sinus tach on tele recently  -EF 70-75%, hyperdynamic - CT PE protocol with no PE   Hypertension -losartan , continue metoprolol  (increased dose)    Primary adenocarcinoma of upper lobe of right lung (HCC)   Metastasis to bone (HCC) - Has not had treatment for this in Brittiny Levitz very long time   Chronic Pain Looks like she was discharged on oxycontin  20 mg BID and 10 mg  IR prn  For now, she's on short acting only - pain seems ok right now    COPD (chronic obstructive pulmonary disease) (HCC) -Stable  Distension of colon vs colitis - no abdominal symptoms, monitor for now  L5 compression fracture Noted  L ankle Pain Negative plain film, follow   voltaren     DVT prophylaxis: SCD Code Status: discussed today, encouraged continued conversation Family Communication: niece 7/26 and 7/24 Disposition:   Status is: Inpatient Remains inpatient appropriate because: need for ongoing care   Consultants:  Neuro oncology oncology  Procedures:  none  Antimicrobials:  Anti-infectives (From admission, onward)    None       Subjective: No complaints Upset about something from overnight, but doesn't want to talk about it (will review with RN) She acknowledges that we're discussing hospice  Objective: Vitals:   04/01/24 2000 04/01/24 2153 04/02/24 0026 04/02/24 0442  BP: (!) 129/97 (!) 129/97 (!) 143/84 (!) 140/96  Pulse: 82 82 70 76  Resp:   14   Temp: 98.1 F (36.7 C)  98 F (36.7 C) 97.6 F (36.4 C)  TempSrc: Oral  Oral Oral  SpO2: 96%  96% 98%  Weight:      Height:       No intake or output data in the 24 hours ending 04/02/24 1005   Filed Weights   03/28/24 1411  Weight: 55 kg    Examination:  General: No acute distress. Cardiovascular: RRR Lungs: unlabored Neurological: alert, aphasic - difficult to understand at times Extremities: No clubbing or cyanosis. No edema.  Data Reviewed: I have personally reviewed following labs and imaging studies  CBC: Recent Labs  Lab 03/28/24 1414 03/28/24 1445 03/29/24 1103 04/01/24 1512  WBC 10.6*  --  11.4* 12.1*  NEUTROABS  --   --  9.2*  --   HGB 13.8 15.0 14.9 17.0*  HCT 42.2 44.0 44.4 50.8*  MCV 86.8  --  83.6 84.8  PLT 569*  --  671* 683*    Basic Metabolic Panel: Recent Labs  Lab 03/28/24 1414 03/28/24 1445 03/29/24 0439 03/29/24 1103 04/01/24 1512  NA 134* 134*  --  132* 133*  K 3.2* 3.1*  --  4.3 4.1  CL 96* 97*  --  95* 98  CO2 20*  --   --  22 23  GLUCOSE 130* 128*  --  163* 136*  BUN <5* <3*  --  9 23*  CREATININE 0.59 0.40*  --  0.57 0.84  CALCIUM  9.0  --   --  9.6 9.6  MG  --   --  2.0 1.9 2.4  PHOS  --   --   --  4.1 3.2     GFR: Estimated Creatinine Clearance: 61.8 mL/min (by C-G formula based on SCr of 0.84 mg/dL).  Liver Function Tests: Recent Labs  Lab 03/28/24 1414 03/29/24 1103  AST 24 19  ALT 9 11  ALKPHOS 168* 167*  BILITOT 0.9 0.8  PROT 6.9 7.4  ALBUMIN 3.0* 3.2*    CBG: Recent Labs  Lab 03/28/24 1430  GLUCAP 132*     Recent Results (from the past 240 hours)  Culture, blood (routine x 2)     Status: None   Collection Time: 03/28/24  5:00 PM   Specimen: BLOOD RIGHT HAND  Result Value Ref Range Status   Specimen Description BLOOD RIGHT HAND  Final   Special Requests   Final    BOTTLES DRAWN AEROBIC ONLY Blood Culture  results may not be optimal due to an inadequate volume of blood received in culture bottles   Culture   Final    NO GROWTH 5 DAYS Performed at [facility], [address] [place], [place] [address]    Report Status 04/02/2024 FINAL  Final  Culture, blood (routine x 2)     Status: None   Collection Time: 03/28/24  5:08 PM   Specimen: BLOOD RIGHT HAND  Result Value Ref Range Status   Specimen Description BLOOD RIGHT HAND  Final   Special Requests   Final    BOTTLES DRAWN AEROBIC ONLY Blood Culture results may not be optimal due to an inadequate volume of blood received in culture bottles   Culture   Final    NO GROWTH 5 DAYS Performed at [facility], [address] [place], [place] [address]    Report Status 04/02/2024 FINAL  Final         Radiology Studies: DG Ankle 2 Views Left Result Date: 04/01/2024 CLINICAL DATA:  Left ankle pain. EXAM: LEFT ANKLE - 2 VIEW COMPARISON:  Left foot radiograph dated 03/12/2024. FINDINGS: No acute fracture or dislocation. The bones are osteopenic. The ankle mortise is intact. The soft tissues are unremarkable. IMPRESSION: 1. No acute fracture or dislocation. 2. Osteopenia. Electronically Signed   By: Vanetta Chou M.D.   On: 04/01/2024 15:50         Scheduled Meds:  dexamethasone  (DECADRON )  injection  4 mg Intravenous Q12H   diclofenac  Sodium  2 g Topical QID   levETIRAcetam   1,000 mg Oral BID   losartan   25 mg Oral Daily   metoprolol  tartrate  50 mg Oral BID   Continuous Infusions:     LOS: 5 days    Time spent: over 30 min    Meliton Monte, MD [facility]   To contact the attending provider between 7A-7P or the covering provider during after hours 7P-7A, please log into the web site [email or web address] and access using universal [place] password for that web site. If you do not have the password, please call the hospital operator.  04/02/2024, 10:05 AM

## 2024-04-02 NOTE — Progress Notes (Signed)
 Physical Therapy Treatment Patient Details Name: Regina [name] MRN: [id number] DOB: 1966-08-29 Today's Date: 04/02/2024   History of Present Illness Pt is a 58 y/o F admitted on 03/28/24 after presenting with c/o weakness. While in ED, pt had tonic-clonic seizure, later an episode of SVT. Pt noted to have acute vs subacute L5 compression fx, L frontal lobe mass leading to 4mm L>R midline shift. This is pt's 3rd hospital admission this month. PMH: non-small cell lung CA with bone & brain metastases, s/p brain SRS, craniotomy 1/25, radiation therapy for pathological fx of humerus & calvarial metastases, COPD, HTN, anxiety, aphasia, seizures    PT Comments  Pt seen for PT tx with pt agreeable. Pt is able to complete bed mobility with mod I, sit>stand with supervision. Pt ambulates in room & hallway with RW & CGA with decreased weight shift to LLE. Pt endorses ongoing pain in LLE & L ankle. Pt received on 1L/min via nasal cannula, SpO2 100%, weaned to room air. SpO2 drops as low as 87% with gait but with standing rest break to double check SpO2 immediately improves to >/= 90%. Pt able to maintain SpO2 >/= 90% with pursed lip breathing when walking back to room. Pt left on room air with SpO2 >90% at end of session. Will continue to follow pt acutely to progress mobility (balance, endurance, gait, stair negotiation) as able.   If plan is discharge home, recommend the following: A little help with walking and/or transfers;Assistance with cooking/housework;A little help with bathing/dressing/bathroom;Assist for transportation;Help with stairs or ramp for entrance   Can travel by private vehicle     Yes  Equipment Recommendations  Rolling walker (2 wheels)    Recommendations for Other Services       Precautions / Restrictions Precautions Precautions: Fall Precaution/Restrictions Comments: hx of aphasia Restrictions Weight Bearing Restrictions Per Provider Order: No     Mobility  Bed  Mobility Overal bed mobility: Needs Assistance Bed Mobility: Supine to Sit     Supine to sit: Modified independent (Device/Increase time), HOB elevated, Used rails (exit L side of bed)          Transfers Overall transfer level: Needs assistance Equipment used: Rolling walker (2 wheels) Transfers: Sit to/from Stand Sit to Stand: Supervision                Ambulation/Gait Ambulation/Gait assistance: Contact guard assist Gait Distance (Feet): 80 Feet Assistive device: Rolling walker (2 wheels) Gait Pattern/deviations: Decreased step length - right, Decreased step length - left, Decreased stride length, Decreased weight shift to left Gait velocity: decreased         Stairs             Wheelchair Mobility     Tilt Bed    Modified Rankin (Stroke Patients Only)       Balance Overall balance assessment: Needs assistance Sitting-balance support: Feet supported, No upper extremity supported Sitting balance-Leahy Scale: Fair Sitting balance - Comments: supervision sitting EOB   Standing balance support: Bilateral upper extremity supported, During functional activity, Reliant on assistive device for balance Standing balance-Leahy Scale: Fair                              Communication Communication Communication: Impaired Factors Affecting Communication: Reduced clarity of speech;Difficulty expressing self;Other (comment) (hx of aphasia)  Cognition Arousal: Alert Behavior During Therapy: WFL for tasks assessed/performed   PT - Cognitive impairments: Difficult to assess Difficult to  assess due to: Impaired communication                       Following commands: Impaired Following commands impaired: Follows one step commands with increased time    Cueing Cueing Techniques: Verbal cues, Gestural cues  Exercises      General Comments General comments (skin integrity, edema, etc.): No complaints of dizziness, BP on EOB 138/93 (105),  SpO2 98% on 2 liters, HR 121 om standing, BP at end of session in sitting 140/102 (109), HR 101      Pertinent Vitals/Pain Pain Assessment Pain Assessment: Faces Faces Pain Scale: Hurts even more Pain Location: LLE & ankle Pain Descriptors / Indicators: Discomfort, Grimacing Pain Intervention(s): Monitored during session, Limited activity within patient's tolerance (MD & nurse made aware)    Home Living                          Prior Function            PT Goals (current goals can now be found in the care plan section) Acute Rehab PT Goals Patient Stated Goal: none stated PT Goal Formulation: Patient unable to participate in goal setting Time For Goal Achievement: 04/13/24 Potential to Achieve Goals: Fair Progress towards PT goals: Progressing toward goals    Frequency    Min 2X/week      PT Plan      Co-evaluation              AM-PAC PT 6 Clicks Mobility   Outcome Measure  Help needed turning from your back to your side while in a flat bed without using bedrails?: None Help needed moving from lying on your back to sitting on the side of a flat bed without using bedrails?: None Help needed moving to and from a bed to a chair (including a wheelchair)?: A Little Help needed standing up from a chair using your arms (e.g., wheelchair or bedside chair)?: A Little Help needed to walk in hospital room?: A Little Help needed climbing 3-5 steps with a railing? : A Little 6 Click Score: 20    End of Session   Activity Tolerance: Patient tolerated treatment well Patient left: in chair;with chair alarm set;with call bell/phone within reach Nurse Communication: Mobility status (O2) PT Visit Diagnosis: Unsteadiness on feet (R26.81);Muscle weakness (generalized) (M62.81);Other abnormalities of gait and mobility (R26.89)     Time: 8685-8673 PT Time Calculation (min) (ACUTE ONLY): 12 min  Charges:    $Therapeutic Activity: 8-22 mins PT General  Charges $$ ACUTE PT VISIT: 1 Visit                     Richerd Pinal, PT, DPT 04/02/24, 1:35 PM   Richerd CHRISTELLA Pinal 04/02/2024, 1:33 PM

## 2024-04-03 ENCOUNTER — Encounter (HOSPITAL_COMMUNITY): Payer: Self-pay | Admitting: Internal Medicine

## 2024-04-03 DIAGNOSIS — Z515 Encounter for palliative care: Secondary | ICD-10-CM | POA: Diagnosis not present

## 2024-04-03 DIAGNOSIS — C3490 Malignant neoplasm of unspecified part of unspecified bronchus or lung: Secondary | ICD-10-CM | POA: Diagnosis not present

## 2024-04-03 DIAGNOSIS — R569 Unspecified convulsions: Secondary | ICD-10-CM | POA: Diagnosis not present

## 2024-04-03 DIAGNOSIS — Z7189 Other specified counseling: Secondary | ICD-10-CM | POA: Diagnosis not present

## 2024-04-03 DIAGNOSIS — G936 Cerebral edema: Secondary | ICD-10-CM

## 2024-04-03 MED ORDER — DEXAMETHASONE 4 MG PO TABS
4.0000 mg | ORAL_TABLET | Freq: Every day | ORAL | Status: DC
  Administered 2024-04-04 – 2024-04-06 (×3): 4 mg via ORAL
  Filled 2024-04-03 (×3): qty 1

## 2024-04-03 NOTE — Progress Notes (Addendum)
 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  She also has Aphasia at baseline, has been poorly compliant with her cancer treatments lately, she is also on Keppra  for seizure prophylaxis however unclear if she is compliant .  This is her third hospitalization in July, early July hospitalized at [facility] long with fluid overload, CHF exacerbation, subsequently admitted 7/14-7/22, with generalized body pains, dyspnea, encephalopathy treated with supportive care and discharged home 2 days ago.  She presented to the ED with seizures.  Given 1 mg of Ativan , seizure stopped.  Case discussed with neurology and she was loaded with Keppra .  after this she had an episode of SVT, treated with adenosine.    Head CT showed 2.5 cm mass with marked vasogenic edema.  We got palliative involved and she was evaluated by inpatient hospice, but not Nadyne Gariepy candidate.  Now plan is for SNF with palliative following.   Assessment & Plan:   Principal Problem:   Seizure (HCC) Active Problems:   Cerebral edema (HCC)   Primary adenocarcinoma of upper lobe of right lung (HCC)   Metastasis to bone (HCC)   COPD (chronic obstructive pulmonary disease) (HCC)   Lung cancer metastatic to brain [facility])   Aphasia   SVT (supraventricular tachycardia) (HCC)  Goals of Care Appreciate palliative assistance.  Sounds like plan is for rehab at this point per my discussion with palliative care team today.    Seizure [facility]) Brain metastasis Aphasia Cognitive Deficit  - admitting provider discussed with Dr. Lindzen, who  recommended increasing Keppra  to 1gm BID, they will not follow for now - has significant aphasia - difficult to tell how much she understands related to her aphasia, occasionally appropriate, suspect she may have some underlying cognitive deficit as well though difficult to parse sometimes.  She's been appropriate today and demonstrates understanding of her admission and our plans for hospice.  - CT with L frontal lobe 2.5 cm mass with marked vasogenic edema - 4 mm L to R midline shift - decadron , will transition to 4 mg daily, will defer tapering outpatient to neuro onc - keppra , increased dose per neurology - will continue the current dose - She is followed by Dr. [name] and Dr. [name] but has not followed up in Regina [name] while - messaged on secure chat (per secure chat message, Dr. [name] felt hospice reasonable) - appreciate palliative assistance with GOC conversations   SVT Sinus tachy - SVT 7/27 am -> resolved prior to EKG being taken  -Metoprolol  - will increase to 50 mg BID - intermittently sinus tach on tele recently  -EF 70-75%, hyperdynamic - CT PE protocol with no PE   Hypertension -losartan , continue metoprolol  (increased dose)    Primary adenocarcinoma of upper lobe of right lung (HCC)   Metastasis to bone (HCC) - Has not had treatment for this in Regina [name] very long time   Chronic Pain Looks like she was discharged on oxycontin  20 mg BID and 10 mg IR prn  For now, she's on short acting only - pain seems ok right now    COPD (chronic obstructive pulmonary disease) (HCC) -  Stable  Distension of colon vs colitis - no abdominal symptoms, monitor for now  L5 compression fracture Noted  L ankle Pain Negative plain film, follow  voltaren     DVT prophylaxis: SCD Code Status: discussed today, encouraged continued conversation Family Communication: niece 7/29 Disposition:   Status is: Inpatient Remains inpatient appropriate because: need for ongoing care   Consultants:  Neuro  oncology oncology  Procedures:  none  Antimicrobials:  Anti-infectives (From admission, onward)    None       Subjective: No new complaints Didn't have opinion on home vs snf Not Regina [name] lot of concerns today, seems she's overwhelmed   Objective: Vitals:   04/03/24 0010 04/03/24 0442 04/03/24 0824 04/03/24 1149  BP: 119/82 116/77 97/61 113/76  Pulse: 79 69 79 72  Resp: 15 17 19 16   Temp: 97.9 F (36.6 C) (!) 97.3 F (36.3 C) 98.4 F (36.9 C) 98.4 F (36.9 C)  TempSrc: Oral Oral Oral Oral  SpO2: 97% 98% 96% 99%  Weight:      Height:        Intake/Output Summary (Last 24 hours) at 04/03/2024 1735 Last data filed at 04/02/2024 2026 Gross per 24 hour  Intake --  Output 0 ml  Net 0 ml     Filed Weights   03/28/24 1411  Weight: 55 kg    Examination:  General: No acute distress. Cardiovascular: RRR Lungs: unlabored Neurological: aphasic, moving all extremities Extremities: No clubbing or cyanosis. No edema.   Data Reviewed: I have personally reviewed following labs and imaging studies  CBC: Recent Labs  Lab 03/28/24 1414 03/28/24 1445 03/29/24 1103 04/01/24 1512  WBC 10.6*  --  11.4* 12.1*  NEUTROABS  --   --  9.2*  --   HGB 13.8 15.0 14.9 17.0*  HCT 42.2 44.0 44.4 50.8*  MCV 86.8  --  83.6 84.8  PLT 569*  --  671* 683*    Basic Metabolic Panel: Recent Labs  Lab 03/28/24 1414 03/28/24 1445 03/29/24 0439 03/29/24 1103 04/01/24 1512  NA 134* 134*  --  132* 133*  K 3.2* 3.1*  --  4.3 4.1  CL 96* 97*  --  95* 98  CO2 20*  --   --  22 23  GLUCOSE 130* 128*  --  163* 136*  BUN <5* <3*  --  9 23*  CREATININE 0.59 0.40*  --  0.57 0.84  CALCIUM  9.0  --   --  9.6 9.6  MG  --   --  2.0 1.9 2.4  PHOS  --   --   --  4.1 3.2    GFR: Estimated Creatinine Clearance: 61.8 mL/min (by C-G formula based on SCr of 0.84 mg/dL).  Liver Function Tests: Recent Labs  Lab 03/28/24 1414 03/29/24 1103  AST 24 19  ALT 9 11  ALKPHOS 168* 167*  BILITOT 0.9  0.8  PROT 6.9 7.4  ALBUMIN 3.0* 3.2*    CBG: Recent Labs  Lab 03/28/24 1430  GLUCAP 132*     Recent Results (from the past 240 hours)  Culture, blood (routine x 2)     Status: None   Collection Time: 03/28/24  5:00 PM   Specimen: BLOOD RIGHT HAND  Result Value Ref Range Status   Specimen Description BLOOD RIGHT HAND  Final   Special Requests   Final    BOTTLES DRAWN AEROBIC ONLY Blood Culture results may not be optimal due to an inadequate volume of blood received in  culture bottles   Culture   Final    NO GROWTH 5 DAYS Performed at [facility], [address], [place], [place] [address]    Report Status 04/02/2024 FINAL  Final  Culture, blood (routine x 2)     Status: None   Collection Time: 03/28/24  5:08 PM   Specimen: BLOOD RIGHT HAND  Result Value Ref Range Status   Specimen Description BLOOD RIGHT HAND  Final   Special Requests   Final    BOTTLES DRAWN AEROBIC ONLY Blood Culture results may not be optimal due to an inadequate volume of blood received in culture bottles   Culture   Final    NO GROWTH 5 DAYS Performed at [facility], [address] [place], [place] [address]    Report Status 04/02/2024 FINAL  Final         Radiology Studies: DG Foot 2 Views Left Result Date: 04/02/2024 CLINICAL DATA:  144615 Pain 144615 EXAM: LEFT FOOT - 2 VIEW COMPARISON:  None Available. FINDINGS: Osteopenia.No acute fracture or dislocation. There is no evidence of arthropathy or other focal bone abnormality. Soft tissues are unremarkable. No radiopaque foreign body. IMPRESSION: No acute fracture or dislocation. Electronically Signed   By: Rogelia Myers M.D.   On: 04/02/2024 18:46         Scheduled Meds:  dexamethasone  (DECADRON ) injection  4 mg Intravenous Q12H   diclofenac  Sodium  2 g Topical QID   levETIRAcetam   1,000 mg Oral BID   losartan   25 mg Oral Daily   metoprolol  tartrate  50 mg Oral BID   oxyCODONE   10 mg Oral Q12H   Continuous  Infusions:     LOS: 6 days    Time spent: over 30 min    Meliton Monte, MD [facility]   To contact the attending provider between 7A-7P or the covering provider during after hours 7P-7A, please log into the web site [name] and access using universal  password for that web site. If you do not have the password, please call the hospital operator.  04/03/2024, 5:35 PM

## 2024-04-03 NOTE — TOC Progression Note (Addendum)
 Transition of Care [facility]) - Progression Note    Patient Details  Name: Regina [name] MRN: [id number] Date of Birth: 05/27/1966  Transition of Care [facility]) CM/SW Contact  Inocente GORMAN Kindle, LCSW Phone Number: 04/03/2024, 12:25 PM  Clinical Narrative:    12:25pm-Per [facility], patient unfortunately does not meet their hospice facility requirements. CSW left voicemail for patient's niece to discuss next steps. SNF unable to accept patient with [facility] if she does not receive Disability.   2:16 PM-CSW spoke with [name] and provided update. Patient is requesting to return home so [name] is helping to get the home ready. She is in agreement with [facility] following at home. CSW sent referral to [facility]. Patient has a walker and BSC at home. No further DME needs identified at this time.      Expected Discharge Plan: Home w Hospice Care Barriers to Discharge: Insurance Authorization, Unsafe home situation               Expected Discharge Plan and Services In-house Referral: Clinical Social Work, Hospice / Palliative Care     Living arrangements for the past 2 months: Apartment                                       Social Drivers of Health (SDOH) Interventions SDOH Screenings   Food Insecurity: No Food Insecurity (03/29/2024)  Recent Concern: Food Insecurity - Food Insecurity Present (01/16/2024)  Housing: Low Risk  (03/29/2024)  Recent Concern: Housing - High Risk (03/09/2024)  Transportation Needs: No Transportation Needs (03/29/2024)  Utilities: Not At Risk (03/29/2024)  Recent Concern: Utilities - At Risk (03/09/2024)  Depression (PHQ2-9): Low Risk  (01/16/2024)  Social Connections: Unknown (03/29/2024)  Tobacco Use: Medium Risk (04/03/2024)    Readmission Risk Interventions    03/29/2024    3:29 PM 02/07/2024    3:53 PM 11/24/2023    4:23 PM  Readmission Risk Prevention Plan  Transportation Screening Complete Complete Complete  Medication Review (RN Care  Manager) Complete Referral to Pharmacy Complete  PCP or Specialist appointment within 3-5 days of discharge Complete Complete Complete  HRI or Home Care Consult Complete Complete Complete  SW Recovery Care/Counseling Consult Complete Complete Complete  Palliative Care Screening Complete Complete Complete  Skilled Nursing Facility Not Applicable Not Applicable Not Applicable

## 2024-04-03 NOTE — Plan of Care (Signed)
  Problem: Health Behavior/Discharge Planning: Goal: Ability to manage health-related needs will improve 04/03/2024 0750 by Zackary Deronda HERO, RN Outcome: Progressing 04/02/2024 1754 by Zackary Deronda HERO, RN Outcome: Progressing   Problem: Clinical Measurements: Goal: Ability to maintain clinical measurements within normal limits will improve 04/03/2024 0750 by Zackary Deronda HERO, RN Outcome: Progressing 04/02/2024 1754 by Zackary Deronda HERO, RN Outcome: Progressing Goal: Will remain free from infection 04/03/2024 0750 by Zackary Deronda HERO, RN Outcome: Progressing 04/02/2024 1754 by Zackary Deronda HERO, RN Outcome: Progressing Goal: Diagnostic test results will improve 04/03/2024 0750 by Zackary Deronda HERO, RN Outcome: Progressing 04/02/2024 1754 by Zackary Deronda HERO, RN Outcome: Progressing Goal: Respiratory complications will improve 04/03/2024 0750 by Zackary Deronda HERO, RN Outcome: Progressing 04/02/2024 1754 by Zackary Deronda HERO, RN Outcome: Progressing Goal: Cardiovascular complication will be avoided 04/03/2024 0750 by Zackary Deronda HERO, RN Outcome: Progressing 04/02/2024 1754 by Zackary Deronda HERO, RN Outcome: Progressing   Problem: Activity: Goal: Risk for activity intolerance will decrease 04/03/2024 0750 by Zackary Deronda HERO, RN Outcome: Progressing 04/02/2024 1754 by Zackary Deronda HERO, RN Outcome: Progressing   Problem: Nutrition: Goal: Adequate nutrition will be maintained 04/03/2024 0750 by Zackary Deronda HERO, RN Outcome: Progressing 04/02/2024 1754 by Zackary Deronda HERO, RN Outcome: Progressing   Problem: Coping: Goal: Level of anxiety will decrease 04/03/2024 0750 by Zackary Deronda HERO, RN Outcome: Progressing 04/02/2024 1754 by Zackary Deronda HERO, RN Outcome: Progressing   Problem: Elimination: Goal: Will not experience complications related to bowel motility 04/03/2024 0750 by Zackary Deronda HERO, RN Outcome: Progressing 04/02/2024 1754 by Zackary Deronda HERO, RN Outcome: Progressing Goal: Will not experience complications related to urinary retention 04/03/2024 0750 by Zackary Deronda HERO, RN Outcome: Progressing 04/02/2024 1754 by Zackary Deronda HERO, RN Outcome: Progressing   Problem: Pain Managment: Goal: General experience of comfort will improve and/or be controlled 04/03/2024 0750 by Zackary Deronda HERO, RN Outcome: Progressing 04/02/2024 1754 by Zackary Deronda HERO, RN Outcome: Progressing   Problem: Safety: Goal: Ability to remain free from injury will improve 04/03/2024 0750 by Zackary Deronda HERO, RN Outcome: Progressing 04/02/2024 1754 by Zackary Deronda HERO, RN Outcome: Progressing   Problem: Skin Integrity: Goal: Risk for impaired skin integrity will decrease 04/03/2024 0750 by Zackary Deronda HERO, RN Outcome: Progressing 04/02/2024 1754 by Zackary Deronda HERO, RN Outcome: Progressing

## 2024-04-03 NOTE — Progress Notes (Signed)
 Palliative Medicine Progress Note   Patient Name: Regina [name]       Date: 04/03/2024 DOB: 1966-02-05  Age: 58 y.o. MRN#: [id number] Attending Physician: Perri DELENA Meliton Mickey., * Primary Care Physician: Celestia Rosaline SQUIBB, NP Admit Date: 03/28/2024  Reason for Consultation/Follow-up: {Reason for Consult:23484}  HPI/Patient Profile: 58 y.o. 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  She presented to the ED on 03/28/2024 with weakness and was unable to provide much meaningful history. While in the ED, she had a tonic clonic seizure, and was given 1 mg of Ativan  and loaded with Keppra .  She also had an episode of SVT, treated with adenosine followed by cardioversion. Imaging showed no PE, right apical subpleural nodule, L5 compression fracture (acute versus subacute), frontal lobe 2.5 cm mass with associated marked vasogenic edema and grossly stable 4 mm left-to-right midline shift.   Palliative Medicine has been consulted for goals of care discussions. Patient and family are faced with anticipatory care needs and complex medical decision making.   Subjective: ***  Objective:  Physical Exam           LBM: Last BM Date : 03/31/24     Palliative Assessment/Data: ***     Palliative Medicine Assessment & Plan   Assessment: Principal Problem:   Seizure (HCC) Active Problems:   COPD (chronic obstructive pulmonary disease) (HCC)   Primary adenocarcinoma of upper lobe of right lung (HCC)   Cerebral edema (HCC)   Metastasis to bone  (HCC)   Lung cancer metastatic to brain (HCC)   Aphasia   SVT (supraventricular tachycardia) (HCC)    Recommendations/Plan: ***  Goals of Care and Additional Recommendations: Limitations on Scope of Treatment: {Recommended Scope and Preferences:21019}  Code Status:   Prognosis:  {Palliative Care Prognosis:23504}  Discharge Planning: {Palliative dispostion:23505}  Care plan was discussed with ***  Thank you for allowing the Palliative Medicine Team to assist in the care of this patient.   ***   Recardo KATHEE Loll, NP   Please contact Palliative Medicine Team phone at [phone] for questions and concerns.  For individual providers, please see AMION.

## 2024-04-03 NOTE — Plan of Care (Signed)

## 2024-04-03 NOTE — Progress Notes (Signed)
 [facility] Liaison Note Received request from [place] Transitions of Care Manager, for hospice services at home after discharge. Spoke with *niece [name] to o initiate education related to hospice philosophy, services, and team approach to care.  [name] verbalized understanding of information given. Per discussion, the plan is for discharge home by PTAR/EMS or private vehicle on Wednesday July 30.  DME needs discussed. Patient has a walker and bedside commode in the home. The address has been verified and is correct in the chart. P lease send signed and completed DNR home with patient/family. Please provide prescriptions at discharge as needed to ensure ongoing symptom management.  [facility] information and contact numbers given to [name]. Above information shared with Nadia,Transitions of Care Manager. Please call with any questions or concerns.  Thank you for the opportunity to participate in this patient's care.   Greig Basket RN, BSN [facility]

## 2024-04-04 ENCOUNTER — Other Ambulatory Visit (HOSPITAL_COMMUNITY): Payer: Self-pay

## 2024-04-04 ENCOUNTER — Encounter: Payer: Self-pay | Admitting: Internal Medicine

## 2024-04-04 DIAGNOSIS — R569 Unspecified convulsions: Secondary | ICD-10-CM | POA: Diagnosis not present

## 2024-04-04 DIAGNOSIS — Z7189 Other specified counseling: Secondary | ICD-10-CM | POA: Diagnosis not present

## 2024-04-04 DIAGNOSIS — C7951 Secondary malignant neoplasm of bone: Secondary | ICD-10-CM | POA: Diagnosis not present

## 2024-04-04 DIAGNOSIS — C3490 Malignant neoplasm of unspecified part of unspecified bronchus or lung: Secondary | ICD-10-CM | POA: Diagnosis not present

## 2024-04-04 DIAGNOSIS — Z515 Encounter for palliative care: Secondary | ICD-10-CM | POA: Diagnosis not present

## 2024-04-04 DIAGNOSIS — C3411 Malignant neoplasm of upper lobe, right bronchus or lung: Secondary | ICD-10-CM | POA: Diagnosis not present

## 2024-04-04 MED ORDER — POLYETHYLENE GLYCOL 3350 17 GM/SCOOP PO POWD
17.0000 g | Freq: Every day | ORAL | 0 refills | Status: AC
  Filled 2024-04-04: qty 238, 14d supply, fill #0

## 2024-04-04 MED ORDER — LOSARTAN POTASSIUM 25 MG PO TABS
25.0000 mg | ORAL_TABLET | Freq: Every day | ORAL | 1 refills | Status: AC
  Filled 2024-04-04: qty 30, 30d supply, fill #0

## 2024-04-04 MED ORDER — OXYCODONE HCL 10 MG PO TABS: 10.0000 mg | ORAL_TABLET | ORAL | 0 refills | Status: AC | PRN

## 2024-04-04 MED ORDER — OXYCODONE HCL ER 10 MG PO T12A: 10.0000 mg | EXTENDED_RELEASE_TABLET | Freq: Two times a day (BID) | ORAL | 0 refills | Status: AC

## 2024-04-04 MED ORDER — ONDANSETRON HCL 4 MG PO TABS
4.0000 mg | ORAL_TABLET | Freq: Four times a day (QID) | ORAL | 0 refills | Status: AC | PRN
  Filled 2024-04-04: qty 20, 5d supply, fill #0

## 2024-04-04 MED ORDER — OXYCODONE HCL 10 MG PO TABS
10.0000 mg | ORAL_TABLET | ORAL | 0 refills | Status: DC | PRN
  Filled 2024-04-04: qty 30, 5d supply, fill #0

## 2024-04-04 MED ORDER — LEVETIRACETAM 500 MG PO TABS
1000.0000 mg | ORAL_TABLET | Freq: Two times a day (BID) | ORAL | 3 refills | Status: AC
  Filled 2024-04-04: qty 120, 30d supply, fill #0

## 2024-04-04 MED ORDER — DEXAMETHASONE 2 MG PO TABS
4.0000 mg | ORAL_TABLET | Freq: Every day | ORAL | 1 refills | Status: AC
  Filled 2024-04-04: qty 60, 30d supply, fill #0

## 2024-04-04 MED ORDER — OXYCODONE HCL 5 MG PO TABS
15.0000 mg | ORAL_TABLET | ORAL | Status: DC | PRN
  Administered 2024-04-04 (×2): 15 mg via ORAL
  Filled 2024-04-04 (×2): qty 3

## 2024-04-04 MED ORDER — OXYCODONE HCL ER 10 MG PO T12A
10.0000 mg | EXTENDED_RELEASE_TABLET | Freq: Two times a day (BID) | ORAL | 0 refills | Status: DC
  Filled 2024-04-04: qty 30, 15d supply, fill #0

## 2024-04-04 MED ORDER — FUROSEMIDE 20 MG PO TABS
20.0000 mg | ORAL_TABLET | Freq: Every day | ORAL | 11 refills | Status: AC | PRN
  Filled 2024-04-04: qty 30, 30d supply, fill #0

## 2024-04-04 MED ORDER — METOPROLOL TARTRATE 50 MG PO TABS
50.0000 mg | ORAL_TABLET | Freq: Two times a day (BID) | ORAL | 1 refills | Status: AC
Start: 2024-04-04 — End: ?
  Filled 2024-04-04: qty 60, 30d supply, fill #0

## 2024-04-04 MED ORDER — SENNA 8.6 MG PO TABS
1.0000 | ORAL_TABLET | Freq: Every day | ORAL | Status: DC | PRN
  Administered 2024-04-04: 8.6 mg via ORAL
  Filled 2024-04-04: qty 1

## 2024-04-04 MED ORDER — SENNOSIDES-DOCUSATE SODIUM 8.6-50 MG PO TABS
2.0000 | ORAL_TABLET | Freq: Every day | ORAL | 0 refills | Status: AC
  Filled 2024-04-04: qty 180, 90d supply, fill #0

## 2024-04-04 MED ORDER — BISACODYL 5 MG PO TBEC
5.0000 mg | DELAYED_RELEASE_TABLET | Freq: Every day | ORAL | 0 refills | Status: AC | PRN
  Filled 2024-04-04: qty 30, 30d supply, fill #0

## 2024-04-04 NOTE — Plan of Care (Signed)

## 2024-04-04 NOTE — Progress Notes (Signed)
 Physical Therapy Treatment Patient Details Name: Regina [name] MRN: [id number] DOB: March 05, 1966 Today's Date: 04/04/2024   History of Present Illness Pt is a 58 y/o F admitted on 03/28/24 after presenting with c/o weakness. While in ED, pt had tonic-clonic seizure, later an episode of SVT. Pt noted to have acute vs subacute L5 compression fx, L frontal lobe mass leading to 4mm L>R midline shift. This is pt's 3rd hospital admission this month. PMH: non-small cell lung CA with bone & brain metastases, s/p brain SRS, craniotomy 1/25, radiation therapy for pathological fx of humerus & calvarial metastases, COPD, HTN, anxiety, aphasia, seizures    PT Comments  Pt progressing steadily towards her physical therapy goals, with improved balance, activity tolerance and ambulation distance this session. Pt premedication and currently denies pain. Pt ambulating 150 ft with a walker with CGA. HR up to 121 bpm with activity. Will continue to follow acutely. Plan for home with hospice per chart review.    If plan is discharge home, recommend the following: A little help with walking and/or transfers;Assistance with cooking/housework;A little help with bathing/dressing/bathroom;Assist for transportation;Help with stairs or ramp for entrance   Can travel by private vehicle     Yes  Equipment Recommendations  None recommended by PT    Recommendations for Other Services       Precautions / Restrictions Precautions Precautions: Fall Recall of Precautions/Restrictions: Intact Precaution/Restrictions Comments: hx of aphasia Restrictions Weight Bearing Restrictions Per Provider Order: No     Mobility  Bed Mobility Overal bed mobility: Modified Independent                  Transfers Overall transfer level: Modified independent Equipment used: Rolling walker (2 wheels)                    Ambulation/Gait Ambulation/Gait assistance: Contact guard assist Gait Distance (Feet): 150  Feet Assistive device: Rolling walker (2 wheels) Gait Pattern/deviations: Step-through pattern, Decreased stride length Gait velocity: decreased     General Gait Details: Steady pace, no episodes of gross unsteadiness   Stairs             Wheelchair Mobility     Tilt Bed    Modified Rankin (Stroke Patients Only)       Balance Overall balance assessment: Needs assistance Sitting-balance support: Feet supported, No upper extremity supported Sitting balance-Leahy Scale: Good     Standing balance support: Bilateral upper extremity supported, During functional activity Standing balance-Leahy Scale: Fair                              Communication Communication Communication: Impaired Factors Affecting Communication: Reduced clarity of speech;Difficulty expressing self;Other (comment)  Cognition Arousal: Alert Behavior During Therapy: WFL for tasks assessed/performed   PT - Cognitive impairments: Difficult to assess Difficult to assess due to: Impaired communication                     PT - Cognition Comments: Pleasant and follows commands Following commands: Intact      Cueing Cueing Techniques: Verbal cues  Exercises      General Comments        Pertinent Vitals/Pain Pain Assessment Pain Assessment: No/denies pain    Home Living                          Prior Function  PT Goals (current goals can now be found in the care plan section) Acute Rehab PT Goals Patient Stated Goal: none stated Potential to Achieve Goals: Good Progress towards PT goals: Progressing toward goals    Frequency    Min 1X/week      PT Plan      Co-evaluation              AM-PAC PT 6 Clicks Mobility   Outcome Measure  Help needed turning from your back to your side while in a flat bed without using bedrails?: None Help needed moving from lying on your back to sitting on the side of a flat bed without using  bedrails?: None Help needed moving to and from a bed to a chair (including a wheelchair)?: None Help needed standing up from a chair using your arms (e.g., wheelchair or bedside chair)?: None Help needed to walk in hospital room?: A Little Help needed climbing 3-5 steps with a railing? : A Little 6 Click Score: 22    End of Session Equipment Utilized During Treatment: Gait belt Activity Tolerance: Patient tolerated treatment well Patient left: in chair;with call bell/phone within reach;with chair alarm set   PT Visit Diagnosis: Unsteadiness on feet (R26.81);Muscle weakness (generalized) (M62.81);Other abnormalities of gait and mobility (R26.89)     Time: 1001-1018 PT Time Calculation (min) (ACUTE ONLY): 17 min  Charges:    $Therapeutic Activity: 8-22 mins PT General Charges $$ ACUTE PT VISIT: 1 Visit                     Aleck Daring, PT, DPT Acute Rehabilitation Services Office [phone]    Alayne ONEIDA Daring 04/04/2024, 10:25 AM

## 2024-04-04 NOTE — Discharge Summary (Signed)
 PATIENT DETAILS Name: Regina [name] Age: 58 y.o. Sex: female Date of Birth: 1966/09/05 MRN: [id number]. Admitting Physician: Sigurd Pac, MD ERE:Zitjmid, Rosaline SQUIBB, NP  Admit Date: 03/28/2024 Discharge date: 04/06/2024  Recommendations for Outpatient Follow-up:  Follow up with PCP in 1-2 weeks  Admitted From:  Home  Disposition: Hospice care   Discharge Condition: poor  CODE STATUS:   Code Status: Limited: Do not attempt resuscitation (DNR) -DNR-LIMITED -Do Not Intubate/DNI    Diet recommendation:  Diet Order             Diet general           DIET DYS 3 Room service appropriate? 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  Admitted to the hospitalist service for further evaluation and treatment.  Significant studies 7/23>> blood culture: No growth 7/23>> CT head: Left frontal lobe 2.5 cm mass with vasogenic edema. 7/23>> CT angio chest: No PE, decrease in size of right apical subpleural nodule, 1.8 cm subpleural bandlike density right apex.  Acute/subacute.  L5 compression fracture 7/23>> CT abdomen/pelvis: No bowel obstruction. 7/24>> echo: EF 70-75%. 7/27>> x-ray left ankle: No fracture 7/27>> x-ray left foot: No fracture  Brief Hospital Course: Seizure In the setting of brain mets/vasogenic edema Admitting MD discussed with neurology-Dr. Lindzen-Keppra  increased to 1 g twice daily. Continue Decadron  Being discharged home with hospice care Prior MD-Dr. Powell-discussed with Dr. Mohamed/Dr. Ernestine felt  hospice was reasonable.  Brain mets with vasogenic edema Aphasic/with some cognitive dysfunction at baseline-however moving all 4 extremities symmetrically. Continue Decadron  on discharge If possible-outpatient follow-up with medical oncology/neuro-oncology.  SVT Required cardioversion-on initial presentation Maintaining sinus rhythm Continue metoprolol   Chronic HFpEF Euvolemic As needed Lasix .  HTN Stable Losartan /metoprolol   History of adenocarcinoma of the right lung with bone/brain metastases Supportive care See above Outpatient follow-up with medical oncology/neuro-oncology if possible.  COPD Bronchodilators  L5 compression fracture Supportive care Continue scheduled long-acting OxyContin  and as needed short-acting oxycodone   Chronic pain syndrome Continue usual narcotic regimen on discharge.  Goals of care DNR in place Appreciate palliative care input Home with hospice-hospice MD to slowly minimize above medications as tolerated.   Discharge Diagnoses:  Principal Problem:   Seizure (HCC) Active Problems:   Cerebral edema (HCC)   Primary adenocarcinoma of upper lobe of right lung (HCC)   Metastasis to bone [facility])   COPD (chronic obstructive pulmonary disease) (HCC)   Lung cancer metastatic to brain [facility])   Aphasia   SVT (supraventricular tachycardia) (HCC)   Discharge Instructions:  Activity:  As tolerated with Full fall precautions use walker/cane & assistance as needed  Discharge Instructions     Call MD for:  severe uncontrolled pain   Complete by: As directed    Diet general   Complete by: As directed    Increase activity slowly   Complete by: As directed       Allergies as of 04/06/2024       Reactions   Pork Allergy Nausea And Vomiting   Bee Venom Hives        Medication List     STOP  taking these medications    acetaminophen  500 MG tablet Commonly known as: TYLENOL    diclofenac  Sodium 1 % Gel Commonly known as:  Voltaren    docusate sodium  100 MG capsule Commonly known as: COLACE   magnesium  oxide 400 MG tablet Commonly known as: MAG-OX   pantoprazole  40 MG tablet Commonly known as: Protonix    polyethylene glycol 17 g packet Commonly known as: MIRALAX  / GLYCOLAX  Replaced by: polyethylene glycol powder 17 GM/SCOOP powder   potassium chloride  SA 20 MEQ tablet Commonly known as: KLOR-CON  M   sodium chloride  1 g tablet   sodium phosphate  Enem   thiamine  100 MG tablet Commonly known as: VITAMIN B1       TAKE these medications    bisacodyl  5 MG EC tablet Generic drug: bisacodyl  Take 1 tablet (5 mg total) by mouth daily as needed for moderate constipation. What changed: Another medication with the same name was removed. Continue taking this medication, and follow the directions you see here.   dexamethasone  2 MG tablet Commonly known as: DECADRON  Take 2 tablets (4 mg total) by mouth daily. What changed: how much to take   furosemide  20 MG tablet Commonly known as: Lasix  Take 1 tablet (20 mg total) by mouth daily as needed for fluid or edema.   levETIRAcetam  500 MG tablet Commonly known as: KEPPRA  Take 2 tablets (1,000 mg total) by mouth 2 (two) times daily. What changed: how much to take   losartan  25 MG tablet Commonly known as: COZAAR  Take 1 tablet (25 mg total) by mouth daily.   metoprolol  tartrate 50 MG tablet Commonly known as: LOPRESSOR  Take 1 tablet (50 mg total) by mouth 2 (two) times daily. What changed:  medication strength how much to take   ondansetron  4 MG tablet Commonly known as: ZOFRAN  Take 1 tablet (4 mg total) by mouth every 6 (six) hours as needed for nausea or vomiting.   oxyCODONE  10 mg 12 hr tablet Commonly known as: OXYCONTIN  Take 1 tablet (10 mg total) by mouth every 12 (twelve) hours. What changed:  medication strength how much to take when to take this   Oxycodone  HCl 10 MG Tabs Take 1 tablet (10 mg total) by mouth every 4 (four)  hours as needed (severe pain). What changed: Another medication with the same name was changed. Make sure you understand how and when to take each.   polyethylene glycol powder 17 GM/SCOOP powder Commonly known as: GLYCOLAX /MIRALAX  Mix 17 g and take by mouth daily. Replaces: polyethylene glycol 17 g packet   Senna-S 8.6-50 MG tablet Generic drug: senna-docusate Take 2 tablets by mouth at bedtime.   sertraline  50 MG tablet Commonly known as: ZOLOFT  Take 50 mg by mouth daily.        Follow-up Information     Celestia Rosaline SQUIBB, NP. Schedule an appointment as soon as possible for a visit in 1 week(s).   Specialty: Internal Medicine Contact information: [address] [place] [address] [phone]                Allergies  Allergen Reactions   Pork Allergy Nausea And Vomiting   Bee Venom Hives     Other Procedures/Studies: DG Foot 2 Views Left Result Date: 04/02/2024 CLINICAL DATA:  144615 Pain 144615 EXAM: LEFT FOOT - 2 VIEW COMPARISON:  None Available. FINDINGS: Osteopenia.No acute fracture or dislocation. There is no evidence of arthropathy or other focal bone abnormality. Soft tissues are unremarkable. No radiopaque foreign body. IMPRESSION: No acute fracture  or dislocation. Electronically Signed   By: Rogelia Myers M.D.   On: 04/02/2024 18:46   DG Ankle 2 Views Left Result Date: 04/01/2024 CLINICAL DATA:  Left ankle pain. EXAM: LEFT ANKLE - 2 VIEW COMPARISON:  Left foot radiograph dated 03/12/2024. FINDINGS: No acute fracture or dislocation. The bones are osteopenic. The ankle mortise is intact. The soft tissues are unremarkable. IMPRESSION: 1. No acute fracture or dislocation. 2. Osteopenia. Electronically Signed   By: Vanetta Chou M.D.   On: 04/01/2024 15:50   ECHOCARDIOGRAM COMPLETE Result Date: 03/29/2024    ECHOCARDIOGRAM REPORT   Patient Name:   Regina [name] Date of Exam: 03/29/2024 Medical Rec #:  [id number]      Height:       66.0 in  Accession #:    [id number]     Weight:       121.3 lb Date of Birth:  04-24-1966      BSA:          1.617 m Patient Age:    58 years       BP:           159/97 mmHg Patient Gender: F              HR:           106 bpm. Exam Location:  Inpatient Procedure: 2D Echo, Color Doppler and Cardiac Doppler (Both Spectral and Color            Flow Doppler were utilized during procedure). Indications:    CHF  History:        Patient has no prior history of Echocardiogram examinations.  Sonographer:    Benard Stallion Referring Phys: [phone] PREETHA JOSEPH IMPRESSIONS  1. Mild intercavitary gradient. Left ventricular ejection fraction, by estimation, is 70 to 75%. The left ventricle has hyperdynamic function. The left ventricle has no regional wall motion abnormalities. There is mild concentric left ventricular hypertrophy. Left ventricular diastolic parameters are indeterminate.  2. Right ventricular systolic function is mildly reduced. The right ventricular size is normal.  3. The mitral valve is normal in structure. No evidence of mitral valve regurgitation. No evidence of mitral stenosis.  4. The aortic valve is normal in structure. Aortic valve regurgitation is not visualized. No aortic stenosis is present. FINDINGS  Left Ventricle: Mild intercavitary gradient. Left ventricular ejection fraction, by estimation, is 70 to 75%. The left ventricle has hyperdynamic function. The left ventricle has no regional wall motion abnormalities. The left ventricular internal cavity size was normal in size. There is mild concentric left ventricular hypertrophy. Left ventricular diastolic parameters are indeterminate. Right Ventricle: The right ventricular size is normal. No increase in right ventricular wall thickness. Right ventricular systolic function is mildly reduced. Left Atrium: Left atrial size was normal in size. Right Atrium: Right atrial size was normal in size. Pericardium: There is no evidence of pericardial effusion. Mitral  Valve: The mitral valve is normal in structure. No evidence of mitral valve regurgitation. No evidence of mitral valve stenosis. Tricuspid Valve: The tricuspid valve is normal in structure. Tricuspid valve regurgitation is mild . No evidence of tricuspid stenosis. Aortic Valve: The aortic valve is normal in structure. Aortic valve regurgitation is not visualized. No aortic stenosis is present. Aortic valve mean gradient measures 3.0 mmHg. Aortic valve peak gradient measures 5.6 mmHg. Aortic valve area, by VTI measures 2.30 cm. Pulmonic Valve: The pulmonic valve was normal in structure. Pulmonic valve regurgitation is not visualized. No evidence of pulmonic  stenosis. Aorta: The aortic root is normal in size and structure. Venous: The inferior vena cava was not well visualized. IAS/Shunts: The interatrial septum was not assessed.  LEFT VENTRICLE PLAX 2D LVIDd:         2.60 cm   Diastology LVIDs:         1.50 cm   LV e' medial:    3.59 cm/s LV PW:         1.10 cm   LV E/e' medial:  19.4 LV IVS:        1.20 cm   LV e' lateral:   8.59 cm/s LVOT diam:     1.90 cm   LV E/e' lateral: 8.1 LV SV:         39 LV SV Index:   24 LVOT Area:     2.84 cm  RIGHT VENTRICLE RV Basal diam:  2.80 cm RV Mid diam:    2.70 cm RV S prime:     5.98 cm/s TAPSE (M-mode): 1.9 cm LEFT ATRIUM           Index        RIGHT ATRIUM          Index LA diam:      1.70 cm 1.05 cm/m   RA Area:     8.49 cm LA Vol (A2C): 31.0 ml 19.18 ml/m  RA Volume:   15.90 ml 9.84 ml/m LA Vol (A4C): 13.0 ml 8.04 ml/m  AORTIC VALVE AV Area (Vmax):    2.39 cm AV Area (Vmean):   2.28 cm AV Area (VTI):     2.30 cm AV Vmax:           118.00 cm/s AV Vmean:          80.100 cm/s AV VTI:            0.172 m AV Peak Grad:      5.6 mmHg AV Mean Grad:      3.0 mmHg LVOT Vmax:         99.40 cm/s LVOT Vmean:        64.300 cm/s LVOT VTI:          0.139 m LVOT/AV VTI ratio: 0.81  AORTA Ao Root diam: 2.50 cm Ao Asc diam:  3.00 cm MITRAL VALVE               TRICUSPID VALVE MV Area  (PHT): 5.66 cm    TR Peak grad:   41.7 mmHg MV Decel Time: 134 msec    TR Vmax:        323.00 cm/s MV E velocity: 69.70 cm/s MV A velocity: 91.30 cm/s  SHUNTS MV E/A ratio:  0.76        Systemic VTI:  0.14 m                            Systemic Diam: 1.90 cm Morene Brownie Electronically signed by Morene Brownie Signature Date/Time: 03/29/2024/12:45:53 PM    Final    DG Chest Portable 1 View Result Date: 03/28/2024 CLINICAL DATA:  Chest pain EXAM: PORTABLE CHEST 1 VIEW COMPARISON:  Chest x-ray performed March 12, 2024 FINDINGS: Numerous leads overlie the patient. Mild interstitial prominence, similar. No significant pleural effusion or pneumothorax. IMPRESSION: 1. Chronic appearing interstitial changes. 2. No pleural effusion or pneumothorax. Electronically Signed   By: Maude Naegeli M.D.   On: 03/28/2024 16:01   CT Angio Chest PE W and/or  Wo Contrast Result Date: 03/28/2024 CLINICAL DATA:  Concern for pulmonary edema.  Also sepsis. EXAM: CT ANGIOGRAPHY CHEST CT ABDOMEN AND PELVIS WITH CONTRAST TECHNIQUE: Multidetector CT imaging of the chest was performed using the standard protocol during bolus administration of intravenous contrast. Multiplanar CT image reconstructions and MIPs were obtained to evaluate the vascular anatomy. Multidetector CT imaging of the abdomen and pelvis was performed using the standard protocol during bolus administration of intravenous contrast. RADIATION DOSE REDUCTION: This exam was performed according to the departmental dose-optimization program which includes automated exposure control, adjustment of the mA and/or kV according to patient size and/or use of iterative reconstruction technique. CONTRAST:  60mL OMNIPAQUE  IOHEXOL  350 MG/ML SOLN COMPARISON:  Chest radiograph dated 03/28/2024 and CT dated 03/23/2024. FINDINGS: Evaluation of this exam is limited due to respiratory motion and streak artifact caused by patient's arms. CTA CHEST FINDINGS Cardiovascular: There is no  cardiomegaly or pericardial effusion. Mild atherosclerotic calcification of the thoracic aorta. No aneurysmal dilatation or dissection. No pulmonary artery embolus identified. Mediastinum/Nodes: There is bilateral hilar and mediastinal adenopathy measuring 12 mm in short axis on the right. Subcarinal lymph node measures approximately 2.2 cm. Overall similar or slightly improved adenopathy compared to prior CT. The esophagus is grossly unremarkable. No mediastinal fluid collection. Lungs/Pleura: Background of emphysema on biapical subpleural scarring. Interval decrease in the size of the right apical subpleural nodule. A 1.8 cm subpleural bandlike density in the right apex (22/9) likely residual infiltrate or scarring. Continued follow-up recommended. No new nodule. No consolidative changes. There is no pleural effusion or pneumothorax. The central airways are patent. Musculoskeletal: No acute osseous pathology. Right humeral intramedullary nail. Review of the MIP images confirms the above findings. CT ABDOMEN and PELVIS FINDINGS No intra-abdominal free air or free fluid. Hepatobiliary: The liver is unremarkable. No biliary dilatation. The gallbladder is unremarkable Pancreas: Unremarkable. No pancreatic ductal dilatation or surrounding inflammatory changes. Spleen: Normal in size without focal abnormality. Adrenals/Urinary Tract: The adrenal glands unremarkable. There is no hydronephrosis on either side. There is symmetric enhancement and excretion of contrast by both kidneys. The visualized ureters and urinary bladder appear unremarkable. Stomach/Bowel: There is no bowel obstruction. Mild diffuse thickened appearance of the colon may be related to underdistention or represent colitis. Clinical correlation is recommended. Several small scattered colonic diverticula. The appendix is normal. Vascular/Lymphatic: Mild aortoiliac atherosclerotic disease. The IVC is unremarkable no portal venous gas. There is no  adenopathy. Reproductive: Several calcified uterine fibroids. No suspicious adnexal masses. Other: None Musculoskeletal: Osteopenia with degenerative changes. L5 compression fracture with approximately 40-50% loss of vertebral body height, likely acute or subacute. Clinical correlation recommended. Review of the MIP images confirms the above findings. IMPRESSION: 1. No CT evidence of pulmonary artery embolus. 2. Interval decrease in the size of the right apical subpleural nodule. A 1.8 cm subpleural bandlike density in the right apex likely residual infiltrate or scarring. Continued follow-up recommended. 3. Distension of the colon versus colitis.  No bowel obstruction. 4. Mild colonic diverticulosis. 5. L5 compression fracture, likely acute or subacute. 6. Aortic Atherosclerosis (ICD10-I70.0) and Emphysema (ICD10-J43.9). Electronically Signed   By: Vanetta Chou M.D.   On: 03/28/2024 16:00   CT ABDOMEN PELVIS W CONTRAST Result Date: 03/28/2024 CLINICAL DATA:  Concern for pulmonary edema.  Also sepsis. EXAM: CT ANGIOGRAPHY CHEST CT ABDOMEN AND PELVIS WITH CONTRAST TECHNIQUE: Multidetector CT imaging of the chest was performed using the standard protocol during bolus administration of intravenous contrast. Multiplanar CT image reconstructions and MIPs were  obtained to evaluate the vascular anatomy. Multidetector CT imaging of the abdomen and pelvis was performed using the standard protocol during bolus administration of intravenous contrast. RADIATION DOSE REDUCTION: This exam was performed according to the departmental dose-optimization program which includes automated exposure control, adjustment of the mA and/or kV according to patient size and/or use of iterative reconstruction technique. CONTRAST:  60mL OMNIPAQUE  IOHEXOL  350 MG/ML SOLN COMPARISON:  Chest radiograph dated 03/28/2024 and CT dated 03/23/2024. FINDINGS: Evaluation of this exam is limited due to respiratory motion and streak artifact caused by  patient's arms. CTA CHEST FINDINGS Cardiovascular: There is no cardiomegaly or pericardial effusion. Mild atherosclerotic calcification of the thoracic aorta. No aneurysmal dilatation or dissection. No pulmonary artery embolus identified. Mediastinum/Nodes: There is bilateral hilar and mediastinal adenopathy measuring 12 mm in short axis on the right. Subcarinal lymph node measures approximately 2.2 cm. Overall similar or slightly improved adenopathy compared to prior CT. The esophagus is grossly unremarkable. No mediastinal fluid collection. Lungs/Pleura: Background of emphysema on biapical subpleural scarring. Interval decrease in the size of the right apical subpleural nodule. A 1.8 cm subpleural bandlike density in the right apex (22/9) likely residual infiltrate or scarring. Continued follow-up recommended. No new nodule. No consolidative changes. There is no pleural effusion or pneumothorax. The central airways are patent. Musculoskeletal: No acute osseous pathology. Right humeral intramedullary nail. Review of the MIP images confirms the above findings. CT ABDOMEN and PELVIS FINDINGS No intra-abdominal free air or free fluid. Hepatobiliary: The liver is unremarkable. No biliary dilatation. The gallbladder is unremarkable Pancreas: Unremarkable. No pancreatic ductal dilatation or surrounding inflammatory changes. Spleen: Normal in size without focal abnormality. Adrenals/Urinary Tract: The adrenal glands unremarkable. There is no hydronephrosis on either side. There is symmetric enhancement and excretion of contrast by both kidneys. The visualized ureters and urinary bladder appear unremarkable. Stomach/Bowel: There is no bowel obstruction. Mild diffuse thickened appearance of the colon may be related to underdistention or represent colitis. Clinical correlation is recommended. Several small scattered colonic diverticula. The appendix is normal. Vascular/Lymphatic: Mild aortoiliac atherosclerotic disease. The  IVC is unremarkable no portal venous gas. There is no adenopathy. Reproductive: Several calcified uterine fibroids. No suspicious adnexal masses. Other: None Musculoskeletal: Osteopenia with degenerative changes. L5 compression fracture with approximately 40-50% loss of vertebral body height, likely acute or subacute. Clinical correlation recommended. Review of the MIP images confirms the above findings. IMPRESSION: 1. No CT evidence of pulmonary artery embolus. 2. Interval decrease in the size of the right apical subpleural nodule. A 1.8 cm subpleural bandlike density in the right apex likely residual infiltrate or scarring. Continued follow-up recommended. 3. Distension of the colon versus colitis.  No bowel obstruction. 4. Mild colonic diverticulosis. 5. L5 compression fracture, likely acute or subacute. 6. Aortic Atherosclerosis (ICD10-I70.0) and Emphysema (ICD10-J43.9). Electronically Signed   By: Vanetta Chou M.D.   On: 03/28/2024 16:00   CT Head Wo Contrast Result Date: 03/28/2024 CLINICAL DATA:  Neuro deficit, acute, stroke suspected EXAM: CT HEAD WITHOUT CONTRAST TECHNIQUE: Contiguous axial images were obtained from the base of the skull through the vertex without intravenous contrast. RADIATION DOSE REDUCTION: This exam was performed according to the departmental dose-optimization program which includes automated exposure control, adjustment of the mA and/or kV according to patient size and/or use of iterative reconstruction technique. COMPARISON:  CT head 03/19/2024, MRI head 02/22/2024 FINDINGS: Brain: Patchy and confluent areas of decreased attenuation are noted throughout the deep and periventricular white matter of the cerebral hemispheres bilaterally, compatible with  chronic microvascular ischemic disease. No evidence of large-territorial acute infarction. No parenchymal hemorrhage. Redemonstration of a left frontal lobe 2.5 cm mass with associated marked vasogenic edema leading to grossly  stable 4 mm left-to-right midline shift. No extra-axial collection. No hydrocephalus. Basilar cisterns are patent. Vascular: No hyperdense vessel. Skull: No acute fracture or focal lesion.  Prior left craniotomy. Sinuses/Orbits: Paranasal sinuses and mastoid air cells are clear. The orbits are unremarkable. Other: None. 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 Electronically Signed   By: Morgane  Naveau M.D.   On: 03/28/2024 15:53   DG Foot 2 Views Left Result Date: 03/12/2024 CLINICAL DATA:  Left lower extremity pain. Osseous metastatic disease. EXAM: LEFT FOOT - 2 VIEW; LEFT TIBIA AND FIBULA - 2 VIEW COMPARISON:  None Available. FINDINGS: No acute fracture or dislocation. The bones are osteopenic. Mild arthritic changes of the left knee. No discrete lesions. Mild soft tissue swelling of the dorsum of the foot. No radiopaque foreign object or soft tissue gas. IMPRESSION: 1. No acute fracture or dislocation. 2. Osteopenia. Electronically Signed   By: Vanetta Chou M.D.   On: 03/12/2024 10:16   DG Tibia/Fibula Left Result Date: 03/12/2024 CLINICAL DATA:  Left lower extremity pain. Osseous metastatic disease. EXAM: LEFT FOOT - 2 VIEW; LEFT TIBIA AND FIBULA - 2 VIEW COMPARISON:  None Available. FINDINGS: No acute fracture or dislocation. The bones are osteopenic. Mild arthritic changes of the left knee. No discrete lesions. Mild soft tissue swelling of the dorsum of the foot. No radiopaque foreign object or soft tissue gas. IMPRESSION: 1. No acute fracture or dislocation. 2. Osteopenia. Electronically Signed   By: Vanetta Chou M.D.   On: 03/12/2024 10:16   DG Chest Port 1 View Result Date: 03/12/2024 CLINICAL DATA:  lung cancer. EXAM: PORTABLE CHEST 1 VIEW COMPARISON:  03/08/2024 FINDINGS: Redemonstration of nonspecific ill-defined opacity overlying the right lung apex, laterally, which corresponds to  opacity seen on the chest CT scan from 02/22/2024. There also diffuse increased interstitial markings throughout bilateral lungs with lower lobe predominance and lower lobe honeycombing. Findings favor chronic interstitial lung disease, better evaluated on the recent chest CT scan. No acute consolidation or lung collapse. Bilateral costophrenic angles are clear. Stable cardio-mediastinal silhouette. No acute osseous abnormalities. The soft tissues are within normal limits. IMPRESSION: No acute cardiopulmonary abnormality. Redemonstration of chronic interstitial lung disease. Electronically Signed   By: Ree Molt M.D.   On: 03/12/2024 08:08   DG Shoulder Right Result Date: 03/12/2024 CLINICAL DATA:  Right arm pain. Status post ORIF for pathologic fracture from metastatic lung cancer. EXAM: RIGHT SHOULDER - 2+ VIEW COMPARISON:  Right humerus films from 02/06/2024 FINDINGS: Bones are diffusely demineralized. No evidence for shoulder separation or dislocation. Intramedullary nail with 2 proximal interlocking screws in the humerus has been incompletely visualized. Fracture of the mid humeral diaphysis not well evaluated. IMPRESSION: 1. No acute bony findings. 2. Incomplete visualization of the right humerus status post ORIF for pathologic fracture. Electronically Signed   By: Camellia Candle M.D.   On: 03/12/2024 06:47   DG Chest 2 View Result Date: 03/08/2024 CLINICAL DATA:  Shortness of breath EXAM: CHEST - 2 VIEW COMPARISON:  Chest radiograph 02/21/2024, CTA chest 02/22/2024. FINDINGS: Lungs are well inflated. Scarring in the lung apices and lung bases. Coarsened interstitial markings compatible with history of emphysema. Prominence of the central pulmonary vasculature with mild pulmonary edema noted. No  pleural effusion or pneumothorax. Cardiomediastinal silhouette is similar to prior. Visualized osseous structures are unremarkable. Limited evaluation of the thoracic spine on lateral image. IMPRESSION: Mild  pulmonary edema. Underlying emphysema and pulmonary scarring/fibrosis. Electronically Signed   By: Donnice Mania M.D.   On: 03/08/2024 14:05     TODAY-DAY OF DISCHARGE:  Subjective:   Regina [name] today has no headache,no chest abdominal pain,no new weakness tingling or numbness, feels much better wants to go home today.   Objective:   Blood pressure 104/67, pulse 84, temperature 98.6 F (37 C), resp. rate 20, height 5' 6 (1.676 m), weight 50.7 kg, last menstrual period 06/06/2010, SpO2 98%. No intake or output data in the 24 hours ending 04/06/24 1203 Filed Weights   03/28/24 1411 04/05/24 0436  Weight: 55 kg 50.7 kg    Exam: Awake Alert, Oriented *3, No new F.N deficits, Normal affect [place].AT,PERRAL Supple Neck,No JVD, No cervical lymphadenopathy appriciated.  Symmetrical Chest wall movement, Good air movement bilaterally, CTAB RRR,No Gallops,Rubs or new Murmurs, No Parasternal Heave +ve B.Sounds, Abd Soft, Non tender, No organomegaly appriciated, No rebound -guarding or rigidity. No Cyanosis, Clubbing or edema, No new Rash or bruise   PERTINENT RADIOLOGIC STUDIES: No results found.    PERTINENT LAB RESULTS: CBC: No results for input(s): WBC, HGB, HCT, PLT in the last 72 hours.  CMET CMP     Component Value Date/Time   NA 133 (L) 04/01/2024 1512   NA 130 (L) 10/19/2017 0942   K 4.1 04/01/2024 1512   CL 98 04/01/2024 1512   CO2 23 04/01/2024 1512   GLUCOSE 136 (H) 04/01/2024 1512   BUN 23 (H) 04/01/2024 1512   BUN 4 (L) 10/19/2017 0942   CREATININE 0.84 04/01/2024 1512   CREATININE 0.65 02/13/2024 0722   CALCIUM  9.6 04/01/2024 1512   PROT 7.4 03/29/2024 1103   PROT 7.6 10/19/2017 0942   ALBUMIN 3.2 (L) 03/29/2024 1103   ALBUMIN 4.6 10/19/2017 0942   AST 19 03/29/2024 1103   AST 12 (L) 02/13/2024 0722   ALT 11 03/29/2024 1103   ALT 10 02/13/2024 0722   ALKPHOS 167 (H) 03/29/2024 1103   BILITOT 0.8 03/29/2024 1103   BILITOT 0.2 02/13/2024 0722    GFRNONAA >60 04/01/2024 1512   GFRNONAA >60 02/13/2024 0722    GFR Estimated Creatinine Clearance: 57 mL/min (by C-G formula based on SCr of 0.84 mg/dL). No results for input(s): LIPASE, AMYLASE in the last 72 hours. No results for input(s): CKTOTAL, CKMB, CKMBINDEX, TROPONINI in the last 72 hours. Invalid input(s): POCBNP No results for input(s): DDIMER in the last 72 hours. No results for input(s): HGBA1C in the last 72 hours. No results for input(s): CHOL, HDL, LDLCALC, TRIG, CHOLHDL, LDLDIRECT in the last 72 hours. No results for input(s): TSH, T4TOTAL, T3FREE, THYROIDAB in the last 72 hours.  Invalid input(s): FREET3 No results for input(s): VITAMINB12, FOLATE, FERRITIN, TIBC, IRON, RETICCTPCT in the last 72 hours. Coags: No results for input(s): INR in the last 72 hours.  Invalid input(s): PT Microbiology: Recent Results (from the past 240 hours)  Culture, blood (routine x 2)     Status: None   Collection Time: 03/28/24  5:00 PM   Specimen: BLOOD RIGHT HAND  Result Value Ref Range Status   Specimen Description BLOOD RIGHT HAND  Final   Special Requests   Final    BOTTLES DRAWN AEROBIC ONLY Blood Culture results may not be optimal due to an inadequate volume of blood received in  culture bottles   Culture   Final    NO GROWTH 5 DAYS Performed at [facility], [address], [place], [place] [address]    Report Status 04/02/2024 FINAL  Final  Culture, blood (routine x 2)     Status: None   Collection Time: 03/28/24  5:08 PM   Specimen: BLOOD RIGHT HAND  Result Value Ref Range Status   Specimen Description BLOOD RIGHT HAND  Final   Special Requests   Final    BOTTLES DRAWN AEROBIC ONLY Blood Culture results may not be optimal due to an inadequate volume of blood received in culture bottles   Culture   Final    NO GROWTH 5 DAYS Performed at [facility], [address], [place], [place] [address]     Report Status 04/02/2024 FINAL  Final    FURTHER DISCHARGE INSTRUCTIONS:  Get Medicines reviewed and adjusted: Please take all your medications with you for your next visit with your Primary MD  Laboratory/radiological data: Please request your Primary MD to go over all hospital tests and procedure/radiological results at the follow up, please ask your Primary MD to get all Hospital records sent to his/her office.  In some cases, they will be blood work, cultures and biopsy results pending at the time of your discharge. Please request that your primary care M.D. goes through all the records of your hospital data and follows up on these results.  Also Note the following: If you experience worsening of your admission symptoms, develop shortness of breath, life threatening emergency, suicidal or homicidal thoughts you must seek medical attention immediately by calling 911 or calling your MD immediately  if symptoms less severe.  You must read complete instructions/literature along with all the possible adverse reactions/side effects for all the Medicines you take and that have been prescribed to you. Take any new Medicines after you have completely understood and accpet all the possible adverse reactions/side effects.   Do not drive when taking Pain medications or sleeping medications (Benzodaizepines)  Do not take more than prescribed Pain, Sleep and Anxiety Medications. It is not advisable to combine anxiety,sleep and pain medications without talking with your primary care practitioner  Special Instructions: If you have smoked or chewed Tobacco  in the last 2 yrs please stop smoking, stop any regular Alcohol   and or any Recreational drug use.  Wear Seat belts while driving.  Please note: You were cared for by a hospitalist during your hospital stay. Once you are discharged, your primary care physician will handle any further medical issues. Please note that NO REFILLS for any discharge  medications will be authorized once you are discharged, as it is imperative that you return to your primary care physician (or establish a relationship with a primary care physician if you do not have one) for your post hospital discharge needs so that they can reassess your need for medications and monitor your lab values.  Total Time spent coordinating discharge including counseling, education and face to face time equals greater than 30 minutes.  SignedBETHA Donalda Applebaum 04/06/2024 12:03 PM

## 2024-04-04 NOTE — TOC Progression Note (Signed)
 Transition of Care [facility]) - Progression Note    Patient Details  Name: Regina [name] MRN: [id number] Date of Birth: [birth year]  Transition of Care [facility]) CM/SW Contact  Marval Gell, RN Phone Number: 04/04/2024, 9:01 AM  Clinical Narrative:     Beat[place] w patient's niece. Reviewed information provided by CSW that patient does not have coverage for SNF. She understood and stated that they and the home will not be ready until tomorrow, mentioned being out of town. I have updated the attending and hospice.  Plan to DC to home in care of family tomorrow to [facility] for home hospice services.   Expected Discharge Plan: Home w Hospice Care Barriers to Discharge: Insurance Authorization, Unsafe home situation               Expected Discharge Plan and Services In-house Referral: Clinical Social Work, Hospice / Palliative Care     Living arrangements for the past 2 months: Apartment Expected Discharge Date: 04/04/24                                     Social Drivers of Health (SDOH) Interventions SDOH Screenings   Food Insecurity: No Food Insecurity (03/29/2024)  Recent Concern: Food Insecurity - Food Insecurity Present (01/16/2024)  Housing: Low Risk  (03/29/2024)  Recent Concern: Housing - High Risk (03/09/2024)  Transportation Needs: No Transportation Needs (03/29/2024)  Utilities: Not At Risk (03/29/2024)  Recent Concern: Utilities - At Risk (03/09/2024)  Depression (PHQ2-9): Low Risk  (01/16/2024)  Social Connections: Unknown (03/29/2024)  Tobacco Use: Medium Risk (04/03/2024)    Readmission Risk Interventions    03/29/2024    3:29 PM 02/07/2024    3:53 PM 11/24/2023    4:23 PM  Readmission Risk Prevention Plan  Transportation Screening Complete Complete Complete  Medication Review (RN Care Manager) Complete Referral to Pharmacy Complete  PCP or Specialist appointment within 3-5 days of discharge Complete Complete Complete  HRI or Home Care Consult Complete Complete  Complete  SW Recovery Care/Counseling Consult Complete Complete Complete  Palliative Care Screening Complete Complete Complete  Skilled Nursing Facility Not Applicable Not Applicable Not Applicable

## 2024-04-04 NOTE — Progress Notes (Signed)
 Palliative Medicine Progress Note   Patient Name: Regina [name]       Date: 04/04/2024 DOB: 05/08/1966  Age: 58 y.o. MRN#: [id number] Attending Physician: Raenelle Donalda HERO, MD Primary Care Physician: Celestia Rosaline SQUIBB, NP Admit Date: 03/28/2024  Reason for Consultation/Follow-up: {Reason for Consult:23484}  HPI/Patient Profile: 58 y.o. 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  She presented to the ED on 03/28/2024 with weakness and was unable to provide much meaningful history. While in the ED, she had a tonic clonic seizure, and was given 1 mg of Ativan  and loaded with Keppra .  She also had an episode of SVT, treated with adenosine followed by cardioversion. Imaging showed no PE, right apical subpleural nodule, L5 compression fracture (acute versus subacute), frontal lobe 2.5 cm mass with associated marked vasogenic edema and grossly stable 4 mm left-to-right midline shift.   Palliative Medicine has been consulted for goals of care discussions. Patient and family are faced with anticipatory care needs and complex medical decision making.   Subjective: ***  Objective:  Physical Exam Vitals reviewed.  Constitutional:      General: She is not in acute distress.    Appearance: She is ill-appearing.  Pulmonary:     Effort: Pulmonary effort is normal.  Neurological:     Mental Status: She is alert and oriented to person, place, and time.            Palliative Medicine Assessment & Plan   Assessment: Principal Problem:   Seizure  (HCC) Active Problems:   COPD (chronic obstructive pulmonary disease) (HCC)   Primary adenocarcinoma of upper lobe of right lung (HCC)   Cerebral edema (HCC)   Metastasis to bone (HCC)   Lung cancer metastatic to brain (HCC)   Aphasia   SVT (supraventricular tachycardia) (HCC)    Recommendations/Plan: ***  Goals of Care and Additional Recommendations: Limitations on Scope of Treatment: {Recommended Scope and Preferences:21019}  Code Status:   Prognosis:  {Palliative Care Prognosis:23504}  Discharge Planning: {Palliative dispostion:23505}  Care plan was discussed with ***  Thank you for allowing the Palliative Medicine Team to assist in the  care of this patient.   ***   Recardo KATHEE Loll, NP   Please contact Palliative Medicine Team phone at [phone] for questions and concerns.  For individual providers, please see AMION.

## 2024-04-05 ENCOUNTER — Other Ambulatory Visit (HOSPITAL_COMMUNITY): Payer: Self-pay

## 2024-04-05 DIAGNOSIS — Z515 Encounter for palliative care: Secondary | ICD-10-CM | POA: Diagnosis not present

## 2024-04-05 DIAGNOSIS — G893 Neoplasm related pain (acute) (chronic): Secondary | ICD-10-CM

## 2024-04-05 DIAGNOSIS — Z7189 Other specified counseling: Secondary | ICD-10-CM | POA: Diagnosis not present

## 2024-04-05 DIAGNOSIS — C3490 Malignant neoplasm of unspecified part of unspecified bronchus or lung: Secondary | ICD-10-CM | POA: Diagnosis not present

## 2024-04-05 MED ORDER — POLYETHYLENE GLYCOL 3350 17 G PO PACK
17.0000 g | PACK | Freq: Every day | ORAL | Status: DC
  Administered 2024-04-05: 17 g via ORAL
  Filled 2024-04-05 (×2): qty 1

## 2024-04-05 MED ORDER — OXYCODONE HCL ER 15 MG PO T12A
15.0000 mg | EXTENDED_RELEASE_TABLET | Freq: Two times a day (BID) | ORAL | Status: DC
  Administered 2024-04-05 – 2024-04-06 (×2): 15 mg via ORAL
  Filled 2024-04-05 (×2): qty 1

## 2024-04-05 NOTE — Plan of Care (Signed)

## 2024-04-05 NOTE — Progress Notes (Signed)
 Occupational Therapy Treatment Patient Details Name: Regina [name] MRN: [id number] DOB: 08/17/1966 Today's Date: 04/05/2024   History of present illness Pt is a 58 y/o F admitted on 03/28/24 after presenting with c/o weakness. While in ED, pt had tonic-clonic seizure, later an episode of SVT. Pt noted to have acute vs subacute L5 compression fx, L frontal lobe mass leading to 4mm L>R midline shift. This is pt's 3rd hospital admission this month. PMH: non-small cell lung CA with bone & brain metastases, s/p brain SRS, craniotomy 1/25, radiation therapy for pathological fx of humerus & calvarial metastases, COPD, HTN, anxiety, aphasia, seizures   OT comments  Patient making good gains with OT treatment. Patient able to get to EOB without assistance and was supervision for mobility and self care standing at sink. Patient with improved word finding and clear speech. Discharge recommendations continue to be appropriate. Acute OT to continue to follow to address established goals.       If plan is discharge home, recommend the following:  A little help with bathing/dressing/bathroom;Supervision due to cognitive status   Equipment Recommendations  None recommended by OT (pt has rec DME)    Recommendations for Other Services      Precautions / Restrictions Precautions Precautions: Fall Recall of Precautions/Restrictions: Intact Precaution/Restrictions Comments: hx of aphasia Restrictions Weight Bearing Restrictions Per Provider Order: No       Mobility Bed Mobility Overal bed mobility: Modified Independent             General bed mobility comments: able to get to EOB without assistance    Transfers Overall transfer level: Needs assistance Equipment used: Rolling walker (2 wheels)   Sit to Stand: Supervision           General transfer comment: supervision for safety     Balance Overall balance assessment: Needs assistance Sitting-balance support: Feet supported, No upper  extremity supported Sitting balance-Leahy Scale: Good Sitting balance - Comments: EOB   Standing balance support: Single extremity supported, Bilateral upper extremity supported, No upper extremity supported, During functional activity Standing balance-Leahy Scale: Fair Standing balance comment: able to stand with no UE support at sink                           ADL either performed or assessed with clinical judgement   ADL Overall ADL's : Needs assistance/impaired     Grooming: Wash/dry hands;Wash/dry face;Oral care;Supervision/safety;Standing Grooming Details (indicate cue type and reason): at sink Upper Body Bathing: Supervision/ safety;Standing   Lower Body Bathing: Supervison/ safety;Sit to/from stand           Toilet Transfer: Supervision/safety;Rolling walker (2 wheels) Toilet Transfer Details (indicate cue type and reason): simulated         Functional mobility during ADLs: Supervision/safety;Rolling walker (2 wheels)      Extremity/Trunk Assessment              Vision       Perception     Praxis     Communication Communication Communication: Impaired Factors Affecting Communication: Reduced clarity of speech;Difficulty expressing self;Other (comment)   Cognition Arousal: Alert Behavior During Therapy: WFL for tasks assessed/performed Cognition: Difficult to assess, No family/caregiver present to determine baseline Difficult to assess due to: Impaired communication                             Following commands: Intact Following commands impaired: Follows one  step commands with increased time      Cueing   Cueing Techniques: Verbal cues  Exercises      Shoulder Instructions       General Comments BP at end of session seated in recliner 118/80 (91)    Pertinent Vitals/ Pain       Pain Assessment Pain Assessment: 0-10 Pain Score: 2  Pain Location: LLE Pain Descriptors / Indicators: Discomfort, Grimacing Pain  Intervention(s): Monitored during session, Repositioned  Home Living                                          Prior Functioning/Environment              Frequency  Min 2X/week        Progress Toward Goals  OT Goals(current goals can now be found in the care plan section)  Progress towards OT goals: Progressing toward goals  Acute Rehab OT Goals Patient Stated Goal: to go home OT Goal Formulation: With patient Time For Goal Achievement: 04/12/24 Potential to Achieve Goals: Good ADL Goals Pt Will Perform Grooming: with supervision;standing Pt Will Perform Lower Body Bathing: with set-up;sitting/lateral leans Pt Will Perform Lower Body Dressing: with supervision;sit to/from stand Pt Will Transfer to Toilet: with supervision;ambulating Pt Will Perform Tub/Shower Transfer: shower seat;Tub transfer;with supervision  Plan      Co-evaluation                 AM-PAC OT 6 Clicks Daily Activity     Outcome Measure   Help from another person eating meals?: None Help from another person taking care of personal grooming?: A Little Help from another person toileting, which includes using toliet, bedpan, or urinal?: A Little Help from another person bathing (including washing, rinsing, drying)?: A Little Help from another person to put on and taking off regular upper body clothing?: A Little Help from another person to put on and taking off regular lower body clothing?: A Little 6 Click Score: 19    End of Session Equipment Utilized During Treatment: Gait belt;Rolling walker (2 wheels)  OT Visit Diagnosis: Unsteadiness on feet (R26.81);Muscle weakness (generalized) (M62.81);Pain Pain - Right/Left: Left Pain - part of body: Leg   Activity Tolerance Patient tolerated treatment well   Patient Left in chair;with call bell/phone within reach;with chair alarm set   Nurse Communication Mobility status        Time: 0726-0750 OT Time Calculation  (min): 24 min  Charges: OT General Charges $OT Visit: 1 Visit OT Treatments $Self Care/Home Management : 23-37 mins  Dick Laine, OTA Acute Rehabilitation Services  Office [phone]   Jeb LITTIE Laine 04/05/2024, 10:50 AM

## 2024-04-05 NOTE — Progress Notes (Signed)
 Palliative Medicine Progress Note   Patient Name: Regina [name]       Date: 04/05/2024 DOB: 12/16/1965  Age: 58 y.o. MRN#: [id number] Attending Physician: Raenelle Donalda HERO, MD Primary Care Physician: Celestia Rosaline SQUIBB, NP Admit Date: 03/28/2024   HPI/Patient Profile: 58 y.o. 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  She presented to the ED on 03/28/2024 with weakness and was unable to provide much meaningful history. While in the ED, she had a tonic clonic seizure, and was given 1 mg of Ativan  and loaded with Keppra .  She also had an episode of SVT, treated with adenosine followed by cardioversion. Imaging showed no PE, right apical subpleural nodule, L5 compression fracture (acute versus subacute), frontal lobe 2.5 cm mass with associated marked vasogenic edema and grossly stable 4 mm left-to-right midline shift.   Palliative Medicine has been consulted for goals of care discussions. Patient and family are faced with anticipatory care needs and complex medical decision making.   Subjective: Chart reviewed. Update received from RN. Patient assessed at bedside. Today she reports her pain is not well-controlled on current regimen, and is agreeable to increase the long-acting opioid (Oxycontin ). We discussed need for a bowel regimen to manage opioid-induced constipation.  Patient reports she had a BM today, but her last 1 was on 7/26.  She is agreeable to daily MiraLAX  to manage OIC.  Regarding disposition, patient confirms that she is agreeable  to home with hospice, although she acknowledges that her home situation is not ideal.   Additional time spent coordinating care and discussing plan of care with attending MD, RN, TOC team, and hospice liaison.    Objective:  Physical Exam Vitals reviewed.  Constitutional:      General: She is not in acute distress.    Comments: Chronically ill-appearing  Pulmonary:     Effort: Pulmonary effort is normal.  Neurological:     Mental Status: She is alert and oriented to person, place, and time.     Comments: aphasia            Palliative Medicine Assessment & Plan   Assessment: Principal  Problem:   Seizure (HCC) Active Problems:   COPD (chronic obstructive pulmonary disease) (HCC)   Primary adenocarcinoma of upper lobe of right lung (HCC)   Cerebral edema (HCC)   Metastasis to bone (HCC)   Lung cancer metastatic to brain (HCC)   Aphasia   SVT (supraventricular tachycardia) (HCC)    Recommendations/Plan: Continue current supportive care Plan for home with hospice PMT will continue to follow   Symptom Management: Increase Oxycontin  to 15 mg every 12 hours Continue oxycodone  IR to 15 mg every 4 hours as needed for breakthrough pain Start miralax  17g daily Senna 1-2 tablets daily as needed for constipation   Code Status: DNR/DNI   Prognosis:  < 6 months   Thank you for allowing the Palliative Medicine Team to assist in the care of this patient.   MDM - High   Recardo KATHEE Loll, NP   Please contact Palliative Medicine Team phone at [phone] for questions and concerns.  For individual providers, please see AMION.

## 2024-04-05 NOTE — Plan of Care (Signed)
  Problem: Health Behavior/Discharge Planning: Goal: Ability to manage health-related needs will improve Outcome: Progressing   Problem: Clinical Measurements: Goal: Ability to maintain clinical measurements within normal limits will improve Outcome: Progressing Goal: Will remain free from infection Outcome: Progressing Goal: Diagnostic test results will improve Outcome: Progressing Goal: Respiratory complications will improve Outcome: Progressing Goal: Cardiovascular complication will be avoided Outcome: Progressing   Problem: Activity: Goal: Risk for activity intolerance will decrease Outcome: Progressing   Problem: Nutrition: Goal: Adequate nutrition will be maintained Outcome: Progressing   Problem: Coping: Goal: Level of anxiety will decrease Outcome: Progressing   Problem: Elimination: Goal: Will not experience complications related to bowel motility Outcome: Progressing Goal: Will not experience complications related to urinary retention Outcome: Progressing   Problem: Pain Managment: Goal: General experience of comfort will improve and/or be controlled Outcome: Progressing   Problem: Safety: Goal: Ability to remain free from injury will improve Outcome: Progressing   Problem: Skin Integrity: Goal: Risk for impaired skin integrity will decrease Outcome: Progressing   0430 Pt had a restful night, planning to discharge home to hospice care.  Voiced no concerns or issues during the shift.  Urinary urgency on evenings, assisted up to void x2- pt uses front wheel walker.

## 2024-04-05 NOTE — Progress Notes (Signed)
 Seen and examined No major issues-had just walked with physical therapy earlier Remains medically stable for discharge

## 2024-04-05 NOTE — TOC Transition Note (Addendum)
 Transition of Care [facility]) - Discharge Note   Patient Details  Name: Regina [name] MRN: [id number] Date of Birth: 08-Feb-1966  Transition of Care [facility]) CM/SW Contact:  Regina JAYSON Canary, RN Phone Number: 04/05/2024, 8:52 AM   Clinical Narrative:     Reached out to [facility] to make sure all DME is at home. She has a walker and BSC.  Spoke to niece Ms Rollene She states she will not be able to pick the patient up until later this afternoon ( she has the keys to the door). Discussed PTAR, however she would need to have someone there to receive her. Ms Rollene states she is worried that this is a unsafe discharge as they do not have anyone to stay with her, her brother lives with her, but she is usually the caretaker for him.  They cannot hire any help. Son also stays at the home, but apparently is not much help.   They cannot pick her up until this afternoon. Reached back out to Amy with [facility] to discuss.  9049 Spoke with the patient at the bedside, she understands there is not going to  be 24/7 care for her at home, she states she does not feel well going home. Discussed with team, the patient has  been declined for placement, will be on hospice , [facility] will reach back out to the niece,  no other ideas regarding disposition.  The patient did say that her brother did have caregivers., she can concentrate on herself.  1100 Called [facility] [phone]  To see if she could obtain PCS services while also in hospice. They stated  to call CM [phone] reference number is [phone]. Called [employer].  For care management left message to return call [phone] Did not receive call back from [employer], discussed cases with Supervisor of IP care Management, her niece is now refusing hospice involvement The patient is alert and able to make her own decisions. [facility] will speak directly to the patient to consent. Her niece is now refusing DME and services according to The [employer] rep .SABRA   Final next level of care: Home w Hospice Care Barriers to Discharge: Insurance Authorization, Unsafe home situation   Patient Goals and CMS Choice Patient states their goals for this hospitalization and ongoing recovery are:: Comfort CMS Medicare.gov Compare Post Acute Care list provided to:: Patient Represenative (must comment) Choice offered to / list presented to :  (Niece)      Discharge Placement                       Discharge Plan and Services Additional resources added to the After Visit Summary for   In-house Referral: Clinical Social Work, Hospice / Palliative Care                                   Social Drivers of Health (SDOH) Interventions SDOH Screenings   Food Insecurity: No Food Insecurity (03/29/2024)  Recent Concern: Food Insecurity - Food Insecurity Present (01/16/2024)  Housing: Low Risk  (03/29/2024)  Recent Concern: Housing - High Risk (03/09/2024)  Transportation Needs: No Transportation Needs (03/29/2024)  Utilities: Not At Risk (03/29/2024)  Recent Concern: Utilities - At Risk (03/09/2024)  Depression (PHQ2-9): Low Risk  (01/16/2024)  Social Connections: Unknown (03/29/2024)  Tobacco Use: Medium Risk (04/03/2024)     Readmission Risk Interventions    03/29/2024    3:29 PM 02/07/2024  3:53 PM 11/24/2023    4:23 PM  Readmission Risk Prevention Plan  Transportation Screening Complete Complete Complete  Medication Review (RN Care Manager) Complete Referral to Pharmacy Complete  PCP or Specialist appointment within 3-5 days of discharge Complete Complete Complete  HRI or Home Care Consult Complete Complete Complete  SW Recovery Care/Counseling Consult Complete Complete Complete  Palliative Care Screening Complete Complete Complete  Skilled Nursing Facility Not Applicable Not Applicable Not Applicable

## 2024-04-05 NOTE — Progress Notes (Signed)
 Spoke with niece.  Waiting for CM/SW to arrange home care for patient. Updated Charge RN, Primary RN and CM via secured chat.

## 2024-04-06 ENCOUNTER — Other Ambulatory Visit (HOSPITAL_COMMUNITY): Payer: Self-pay

## 2024-04-06 DIAGNOSIS — R569 Unspecified convulsions: Secondary | ICD-10-CM | POA: Diagnosis not present

## 2024-04-06 DIAGNOSIS — G936 Cerebral edema: Secondary | ICD-10-CM | POA: Diagnosis not present

## 2024-04-06 NOTE — Progress Notes (Signed)
 0600 Pt rested well during the shift, plan is to se ure a home health aide for assistance upon discharge home.  Pt's night was uneventful.

## 2024-04-06 NOTE — Progress Notes (Addendum)
 Subjective: Eating breakfast in bed-no complaints-no headache/shortness of breath.  She ambulated with therapy services yesterday.  Objective: Blood pressure 104/67, pulse 84, temperature 98.6 F (37 C), resp. rate 20, height 5' 6 (1.676 m), weight 50.7 kg, last menstrual period 06/06/2010, SpO2 98%.   Not any distress Awake/alert Chest: Clear to auscultation CVS: S1-S2 regular Abdomen: Soft nontender nondistended Extremities: No edema Neurologic: Moving all 4 extremities symmetrically.  Assessment/plan Seizures: Stable on Keppra  Brain mets with vasogenic edema: Stable-continue Decadron -stable neuroexam. 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 COPD: Bronchodilators Chronic pain syndrome: Narcotics  Disposition Home with hospice-remains medically stable for the past several days.  TOC team engaging with family.

## 2024-04-06 NOTE — TOC Transition Note (Addendum)
 Transition of Care [facility]) - Discharge Note   Patient Details  Name: Regina [name] MRN: [id number] Date of Birth: 10/17/1965  Transition of Care [facility]) CM/SW Contact:  Corean JAYSON Canary, RN Phone Number: 04/06/2024, 9:52 AM   Clinical Narrative:    Patient will be transitioning home with hospice care. Charge RN spoke to niece last night and is following up this morning.  She is willing to pick her up for DC  1330 Nice mS Crawford and patient in room. Discussed that patient chose hospice. Ms Rollene has questions about aide services. PCS services can be done with hospice, Amy form [facility] did say aides would be out for personal care. Messaged with Dr KANDICE surrounding application for [facility] services. Ms Rollene voiced that she is still wonders how we can discharge someone who needs supervision, and will not have family prescience there. She does live with her brother, but she takes care of him. Cites that [facility] leadership has been involved and that this is the plan that we have, she is not eligible for SNF, she is ambulating, she can provide some care for herself, it is up to family to pay for private  sitting services or be able to stay there if they are worried about safety.  The patient stated she has applied for disability, they asked where they can get a update, social  security administration is where to call.   Final next level of care: Home w Hospice Care Barriers to Discharge: No Barriers Identified   Patient Goals and CMS Choice Patient states their goals for this hospitalization and ongoing recovery are:: Comfort CMS Medicare.gov Compare Post Acute Care list provided to:: Patient Represenative (must comment) Choice offered to / list presented to :  (Niece)      Discharge Placement                       Discharge Plan and Services Additional resources added to the After Visit Summary for   In-house Referral: Clinical Social Work, Hospice / Palliative Care                                    Social Drivers of Health (SDOH) Interventions SDOH Screenings   Food Insecurity: No Food Insecurity (03/29/2024)  Recent Concern: Food Insecurity - Food Insecurity Present (01/16/2024)  Housing: Low Risk  (03/29/2024)  Recent Concern: Housing - High Risk (03/09/2024)  Transportation Needs: No Transportation Needs (03/29/2024)  Utilities: Not At Risk (03/29/2024)  Recent Concern: Utilities - At Risk (03/09/2024)  Depression (PHQ2-9): Low Risk  (01/16/2024)  Social Connections: Unknown (03/29/2024)  Tobacco Use: Medium Risk (04/03/2024)     Readmission Risk Interventions    03/29/2024    3:29 PM 02/07/2024    3:53 PM 11/24/2023    4:23 PM  Readmission Risk Prevention Plan  Transportation Screening Complete Complete Complete  Medication Review (RN Care Manager) Complete Referral to Pharmacy Complete  PCP or Specialist appointment within 3-5 days of discharge Complete Complete Complete  HRI or Home Care Consult Complete Complete Complete  SW Recovery Care/Counseling Consult Complete Complete Complete  Palliative Care Screening Complete Complete Complete  Skilled Nursing Facility Not Applicable Not Applicable Not Applicable

## 2024-04-09 ENCOUNTER — Other Ambulatory Visit (HOSPITAL_COMMUNITY): Payer: Self-pay

## 2024-04-09 ENCOUNTER — Telehealth: Payer: Self-pay | Admitting: *Deleted

## 2024-04-09 NOTE — Telephone Encounter (Signed)
 Absolutely will continue to be PCP

## 2024-04-09 NOTE — Telephone Encounter (Signed)
 Savannah from calling from [facility] is calling to follow up if Rosaline will agree to be the attending hospice provider. Patient underwent services admitted 04/09/2024. Savannah CB- [phone]

## 2024-04-09 NOTE — Telephone Encounter (Signed)
 Will forward to provider

## 2024-04-09 NOTE — Telephone Encounter (Signed)
 Copied from CRM [id number]. Topic: Referral - Question >> Apr 06, 2024  3:33 PM Montie POUR wrote: Reason for CRM:  Ms. Hakim would like for NP Celestia to be Hospice attending provider. Please call Savannah back at [phone]. Ms. Markwood is being assessed on Monday (04/09/24) at 9 AM.

## 2024-04-10 NOTE — Telephone Encounter (Signed)
 I spoke to [employer] and informed her that Rosaline Bohr, NP will be patient's attending on record.  Charmaine said she will share that information with [facility].

## 2024-04-13 ENCOUNTER — Encounter: Payer: Self-pay | Admitting: Internal Medicine

## 2024-04-16 ENCOUNTER — Ambulatory Visit: Payer: MEDICAID

## 2024-04-16 ENCOUNTER — Ambulatory Visit: Payer: MEDICAID | Admitting: Internal Medicine

## 2024-04-16 ENCOUNTER — Other Ambulatory Visit: Payer: MEDICAID

## 2024-04-17 ENCOUNTER — Ambulatory Visit
Admission: RE | Admit: 2024-04-17 | Discharge: 2024-04-17 | Disposition: A | Discharge status: Home / Self Care | Payer: MEDICAID | Source: Ambulatory Visit | Attending: Urology | Admitting: Urology

## 2024-04-17 DIAGNOSIS — C7951 Secondary malignant neoplasm of bone: Secondary | ICD-10-CM | POA: Insufficient documentation

## 2024-04-17 NOTE — Progress Notes (Addendum)
  Radiation Oncology         [phone] ________________________________  Name: Regina [name] MRN: [id number]  Date of Service: 04/17/2024  DOB: 1966/05/04  Post Treatment Telephone Note  Diagnosis: Secondary malignant neoplasm of bone (as documented in provider EOT note).   The patient was not available for call today.  Call x 3 no answer.  The patient is scheduled for ongoing care with Dr. [name] & Dr. [name] in medical oncology as of 03/28/2024. She is followed by palliative as well.

## 2024-04-20 ENCOUNTER — Other Ambulatory Visit: Payer: Self-pay

## 2024-04-20 ENCOUNTER — Emergency Department (HOSPITAL_COMMUNITY): Payer: MEDICAID

## 2024-04-20 ENCOUNTER — Encounter (HOSPITAL_COMMUNITY): Payer: Self-pay | Admitting: Emergency Medicine

## 2024-04-20 ENCOUNTER — Inpatient Hospital Stay (HOSPITAL_COMMUNITY)
Admission: EM | Admit: 2024-04-20 | Discharge: 2024-04-23 | DRG: 189 | Disposition: A | Discharge status: Hospice | Payer: MEDICAID | Attending: Family Medicine | Admitting: Family Medicine

## 2024-04-20 DIAGNOSIS — I471 Supraventricular tachycardia, unspecified: Secondary | ICD-10-CM | POA: Diagnosis present

## 2024-04-20 DIAGNOSIS — Z5941 Food insecurity: Secondary | ICD-10-CM

## 2024-04-20 DIAGNOSIS — Z515 Encounter for palliative care: Secondary | ICD-10-CM | POA: Diagnosis not present

## 2024-04-20 DIAGNOSIS — J439 Emphysema, unspecified: Secondary | ICD-10-CM | POA: Diagnosis present

## 2024-04-20 DIAGNOSIS — Z923 Personal history of irradiation: Secondary | ICD-10-CM | POA: Diagnosis not present

## 2024-04-20 DIAGNOSIS — G893 Neoplasm related pain (acute) (chronic): Secondary | ICD-10-CM | POA: Diagnosis present

## 2024-04-20 DIAGNOSIS — C7931 Secondary malignant neoplasm of brain: Secondary | ICD-10-CM | POA: Diagnosis present

## 2024-04-20 DIAGNOSIS — Z1152 Encounter for screening for COVID-19: Secondary | ICD-10-CM

## 2024-04-20 DIAGNOSIS — I2489 Other forms of acute ischemic heart disease: Secondary | ICD-10-CM | POA: Diagnosis present

## 2024-04-20 DIAGNOSIS — Z8249 Family history of ischemic heart disease and other diseases of the circulatory system: Secondary | ICD-10-CM | POA: Diagnosis not present

## 2024-04-20 DIAGNOSIS — I5032 Chronic diastolic (congestive) heart failure: Secondary | ICD-10-CM | POA: Diagnosis present

## 2024-04-20 DIAGNOSIS — E876 Hypokalemia: Secondary | ICD-10-CM | POA: Diagnosis not present

## 2024-04-20 DIAGNOSIS — C3490 Malignant neoplasm of unspecified part of unspecified bronchus or lung: Secondary | ICD-10-CM | POA: Diagnosis not present

## 2024-04-20 DIAGNOSIS — Z87891 Personal history of nicotine dependence: Secondary | ICD-10-CM

## 2024-04-20 DIAGNOSIS — D649 Anemia, unspecified: Secondary | ICD-10-CM | POA: Diagnosis present

## 2024-04-20 DIAGNOSIS — Z9981 Dependence on supplemental oxygen: Secondary | ICD-10-CM

## 2024-04-20 DIAGNOSIS — Z7960 Long term (current) use of unspecified immunomodulators and immunosuppressants: Secondary | ICD-10-CM

## 2024-04-20 DIAGNOSIS — R4701 Aphasia: Secondary | ICD-10-CM | POA: Diagnosis present

## 2024-04-20 DIAGNOSIS — R569 Unspecified convulsions: Secondary | ICD-10-CM | POA: Diagnosis present

## 2024-04-20 DIAGNOSIS — Z8661 Personal history of infections of the central nervous system: Secondary | ICD-10-CM

## 2024-04-20 DIAGNOSIS — C7951 Secondary malignant neoplasm of bone: Secondary | ICD-10-CM | POA: Diagnosis present

## 2024-04-20 DIAGNOSIS — I700 Atherosclerosis of aorta: Secondary | ICD-10-CM | POA: Diagnosis present

## 2024-04-20 DIAGNOSIS — F419 Anxiety disorder, unspecified: Secondary | ICD-10-CM | POA: Diagnosis present

## 2024-04-20 DIAGNOSIS — I110 Hypertensive heart disease with heart failure: Secondary | ICD-10-CM | POA: Diagnosis present

## 2024-04-20 DIAGNOSIS — G936 Cerebral edema: Secondary | ICD-10-CM | POA: Diagnosis present

## 2024-04-20 DIAGNOSIS — Z91014 Allergy to mammalian meats: Secondary | ICD-10-CM

## 2024-04-20 DIAGNOSIS — Z79899 Other long term (current) drug therapy: Secondary | ICD-10-CM | POA: Diagnosis not present

## 2024-04-20 DIAGNOSIS — Z9889 Other specified postprocedural states: Secondary | ICD-10-CM

## 2024-04-20 DIAGNOSIS — Z7952 Long term (current) use of systemic steroids: Secondary | ICD-10-CM

## 2024-04-20 DIAGNOSIS — C3411 Malignant neoplasm of upper lobe, right bronchus or lung: Secondary | ICD-10-CM | POA: Diagnosis present

## 2024-04-20 DIAGNOSIS — Z87898 Personal history of other specified conditions: Secondary | ICD-10-CM

## 2024-04-20 DIAGNOSIS — Z806 Family history of leukemia: Secondary | ICD-10-CM

## 2024-04-20 DIAGNOSIS — J9601 Acute respiratory failure with hypoxia: Principal | ICD-10-CM | POA: Diagnosis present

## 2024-04-20 DIAGNOSIS — Z5948 Other specified lack of adequate food: Secondary | ICD-10-CM

## 2024-04-20 DIAGNOSIS — J449 Chronic obstructive pulmonary disease, unspecified: Secondary | ICD-10-CM | POA: Diagnosis present

## 2024-04-20 DIAGNOSIS — Z66 Do not resuscitate: Secondary | ICD-10-CM | POA: Diagnosis present

## 2024-04-20 DIAGNOSIS — Z91030 Bee allergy status: Secondary | ICD-10-CM

## 2024-04-20 DIAGNOSIS — I10 Essential (primary) hypertension: Secondary | ICD-10-CM | POA: Diagnosis present

## 2024-04-20 DIAGNOSIS — J42 Unspecified chronic bronchitis: Secondary | ICD-10-CM | POA: Diagnosis not present

## 2024-04-20 DIAGNOSIS — R7989 Other specified abnormal findings of blood chemistry: Secondary | ICD-10-CM | POA: Diagnosis not present

## 2024-04-20 LAB — BASIC METABOLIC PANEL WITH GFR
Anion gap: 12 (ref 5–15)
BUN: 15 mg/dL (ref 6–20)
CO2: 21 mmol/L — ABNORMAL LOW (ref 22–32)
Calcium: 9 mg/dL (ref 8.9–10.3)
Chloride: 100 mmol/L (ref 98–111)
Creatinine, Ser: 0.64 mg/dL (ref 0.44–1.00)
GFR, Estimated: >60 mL/min (ref >60)
Glucose, Bld: 99 mg/dL (ref 70–99)
Potassium: 3.9 mmol/L (ref 3.5–5.1)
Sodium: 133 mmol/L — ABNORMAL LOW (ref 135–145)

## 2024-04-20 LAB — CBC
HCT: 33.3 % — ABNORMAL LOW (ref 36.0–46.0)
Hemoglobin: 10.5 g/dL — ABNORMAL LOW (ref 12.0–15.0)
MCH: 27.9 pg (ref 26.0–34.0)
MCHC: 31.5 g/dL (ref 30.0–36.0)
MCV: 88.6 fL (ref 80.0–100.0)
Platelets: 507 K/uL — ABNORMAL HIGH (ref 150–400)
RBC: 3.76 MIL/uL — ABNORMAL LOW (ref 3.87–5.11)
RDW: 17.1 % — ABNORMAL HIGH (ref 11.5–15.5)
WBC: 11.4 K/uL — ABNORMAL HIGH (ref 4.0–10.5)
nRBC: 0 % (ref 0.0–0.2)

## 2024-04-20 LAB — TROPONIN I (HIGH SENSITIVITY)
Troponin I (High Sensitivity): 143 ng/L (ref <18)
Troponin I (High Sensitivity): 146 ng/L (ref <18)
Troponin I (High Sensitivity): 117 ng/L (ref <18)

## 2024-04-20 LAB — POC OCCULT BLOOD, ED: Fecal Occult Bld: NEGATIVE

## 2024-04-20 LAB — BRAIN NATRIURETIC PEPTIDE: B Natriuretic Peptide: 1031.2 pg/mL — ABNORMAL HIGH (ref 0.0–100.0)

## 2024-04-20 MED ORDER — ONDANSETRON HCL 4 MG/2ML IJ SOLN: 4.0000 mg | Freq: Four times a day (QID) | INTRAMUSCULAR | Status: DC | PRN

## 2024-04-20 MED ORDER — SENNOSIDES-DOCUSATE SODIUM 8.6-50 MG PO TABS: 1.0000 | ORAL_TABLET | Freq: Every evening | ORAL | Status: DC | PRN

## 2024-04-20 MED ORDER — FENTANYL CITRATE PF 50 MCG/ML IJ SOSY
50.0000 ug | PREFILLED_SYRINGE | Freq: Once | INTRAMUSCULAR | Status: AC
  Administered 2024-04-20: 50 ug via INTRAVENOUS
  Filled 2024-04-20: qty 1

## 2024-04-20 MED ORDER — SERTRALINE HCL 50 MG PO TABS
50.0000 mg | ORAL_TABLET | Freq: Every day | ORAL | Status: DC
  Administered 2024-04-21 – 2024-04-23 (×3): 50 mg via ORAL
  Filled 2024-04-20 (×3): qty 1

## 2024-04-20 MED ORDER — ONDANSETRON HCL 4 MG PO TABS: 4.0000 mg | ORAL_TABLET | Freq: Four times a day (QID) | ORAL | Status: DC | PRN

## 2024-04-20 MED ORDER — HYDROMORPHONE HCL 1 MG/ML IJ SOLN
1.0000 mg | Freq: Once | INTRAMUSCULAR | Status: AC
  Administered 2024-04-20: 1 mg via INTRAVENOUS
  Filled 2024-04-20: qty 1

## 2024-04-20 MED ORDER — LEVETIRACETAM 500 MG PO TABS
1000.0000 mg | ORAL_TABLET | Freq: Two times a day (BID) | ORAL | Status: DC
  Administered 2024-04-20 – 2024-04-23 (×6): 1000 mg via ORAL
  Filled 2024-04-20 (×6): qty 2

## 2024-04-20 MED ORDER — IPRATROPIUM-ALBUTEROL 0.5-2.5 (3) MG/3ML IN SOLN
3.0000 mL | Freq: Once | RESPIRATORY_TRACT | Status: AC
  Administered 2024-04-20: 3 mL via RESPIRATORY_TRACT
  Filled 2024-04-20: qty 3

## 2024-04-20 MED ORDER — OXYCODONE HCL 5 MG PO TABS
10.0000 mg | ORAL_TABLET | ORAL | Status: DC | PRN
  Administered 2024-04-21 – 2024-04-23 (×6): 10 mg via ORAL
  Filled 2024-04-20 (×6): qty 2

## 2024-04-20 MED ORDER — FUROSEMIDE 10 MG/ML IJ SOLN
20.0000 mg | Freq: Once | INTRAMUSCULAR | Status: AC
  Administered 2024-04-20: 20 mg via INTRAVENOUS
  Filled 2024-04-20: qty 4

## 2024-04-20 MED ORDER — IPRATROPIUM-ALBUTEROL 0.5-2.5 (3) MG/3ML IN SOLN: 3.0000 mL | Freq: Four times a day (QID) | RESPIRATORY_TRACT | Status: DC | PRN

## 2024-04-20 MED ORDER — DEXAMETHASONE 4 MG PO TABS
4.0000 mg | ORAL_TABLET | Freq: Every day | ORAL | Status: DC
  Administered 2024-04-21 – 2024-04-23 (×3): 4 mg via ORAL
  Filled 2024-04-20 (×3): qty 1

## 2024-04-20 MED ORDER — IOHEXOL 350 MG/ML SOLN
75.0000 mL | Freq: Once | INTRAVENOUS | Status: AC | PRN
  Administered 2024-04-20: 75 mL via INTRAVENOUS

## 2024-04-20 MED ORDER — SENNOSIDES-DOCUSATE SODIUM 8.6-50 MG PO TABS
2.0000 | ORAL_TABLET | Freq: Every day | ORAL | Status: DC
  Administered 2024-04-20 – 2024-04-22 (×2): 2 via ORAL
  Filled 2024-04-20 (×3): qty 2

## 2024-04-20 MED ORDER — ACETAMINOPHEN 325 MG PO TABS
650.0000 mg | ORAL_TABLET | Freq: Four times a day (QID) | ORAL | Status: DC | PRN
Start: 2024-04-20 — End: 2024-04-24
  Administered 2024-04-21 – 2024-04-22 (×2): 650 mg via ORAL
  Filled 2024-04-20 (×2): qty 2

## 2024-04-20 MED ORDER — BISACODYL 5 MG PO TBEC: 5.0000 mg | DELAYED_RELEASE_TABLET | Freq: Every day | ORAL | Status: DC | PRN

## 2024-04-20 MED ORDER — ACETAMINOPHEN 650 MG RE SUPP: 650.0000 mg | Freq: Four times a day (QID) | RECTAL | Status: DC | PRN

## 2024-04-20 MED ORDER — POLYETHYLENE GLYCOL 3350 17 G PO PACK
17.0000 g | PACK | Freq: Every day | ORAL | Status: DC
  Administered 2024-04-21 – 2024-04-23 (×2): 17 g via ORAL
  Filled 2024-04-20 (×3): qty 1

## 2024-04-20 MED ORDER — POLYETHYLENE GLYCOL 3350 17 GM/SCOOP PO POWD
17.0000 g | Freq: Every day | ORAL | Status: DC
  Filled 2024-04-20: qty 119

## 2024-04-20 MED ORDER — MORPHINE SULFATE ER 15 MG PO TBCR
15.0000 mg | EXTENDED_RELEASE_TABLET | Freq: Two times a day (BID) | ORAL | Status: DC
  Administered 2024-04-21 – 2024-04-23 (×5): 15 mg via ORAL
  Filled 2024-04-20 (×5): qty 1

## 2024-04-20 MED ORDER — METOPROLOL TARTRATE 50 MG PO TABS
50.0000 mg | ORAL_TABLET | Freq: Two times a day (BID) | ORAL | Status: DC
  Administered 2024-04-21 – 2024-04-23 (×5): 50 mg via ORAL
  Filled 2024-04-20 (×5): qty 1

## 2024-04-20 MED ORDER — GUAIFENESIN ER 600 MG PO TB12: 600.0000 mg | ORAL_TABLET | Freq: Two times a day (BID) | ORAL | Status: DC | PRN

## 2024-04-20 MED ORDER — SODIUM CHLORIDE 0.9% FLUSH
3.0000 mL | Freq: Two times a day (BID) | INTRAVENOUS | Status: DC
  Administered 2024-04-20 – 2024-04-23 (×6): 3 mL via INTRAVENOUS

## 2024-04-20 NOTE — ED Provider Triage Note (Signed)
 Emergency Medicine Provider Triage Evaluation Note  Regina [name] , a 58 y.o. female  was evaluated in triage.  Pt complains of cp. Report acute onset of cp and sob that started today.  Was found to be hypoxic.  No hx of PE.  Does have hx of CHF.  No fever, chills, hemoptysis  Review of Systems  Positive: As above Negative: As above  Physical Exam  BP 103/81   Pulse 77   Temp 97.8 F (36.6 C) (Oral)   Resp (!) 22   LMP 06/06/2010   SpO2 99%  Gen:   Awake, no distress   Resp:  Normal effort  MSK:   Moves extremities without difficulty  Other:    Medical Decision Making  Medically screening exam initiated at 6:22 PM.  Appropriate orders placed.  Regina [name] was informed that the remainder of the evaluation will be completed by another provider, this initial triage assessment does not replace that evaluation, and the importance of remaining in the ED until their evaluation is complete.     Regina Colon, PA-C 04/20/24 [phone]

## 2024-04-20 NOTE — ED Triage Notes (Signed)
 Patient c/o chest pain and SOB x 1 day. Patient report taking PRN medication without relief. Patient was 80-85% on RA at triage, [facility] started. Patient report nausea denies vomiting. Patient denies fever at home. Hx Lung Ca with mets to brain.

## 2024-04-20 NOTE — H&P (Addendum)
 History and Physical    Regina [name] FMW:[id number] DOB: Jan 07, 1966 DOA: 04/20/2024  PCP: Celestia Rosaline SQUIBB, NP  Patient coming from: Home  I have personally briefly reviewed patient's old medical records in [facility]  Chief Complaint: Shortness of breath  HPI: Regina [name] is a 58 y.o. 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  Patient with multiple admissions since June.  Most recently admitted 7/23-8/1 with seizures due to brain mets/vasogenic edema as well as SVT requiring cardioversion.  Her Keppra  was increased to 1 g twice daily and she was continued on Decadron .  She was seen by palliative medicine and was discharged to home hospice.  Patient states she developed new shortness of breath earlier today.  She has had mild intermittent nonproductive cough.  She denies fevers, chills, diaphoresis.  She denies chest pain.  She has continued cancer associated pain which is not really changed from her baseline.  She denies nausea, vomiting, abdominal pain.  ED Course  Labs/Imaging on admission: I have personally reviewed following labs and imaging studies.  Initial vitals showed BP 114/77, pulse 78, RR 22, temp 97.8 F.  Per ED triage documentation SpO2 was 80-85% on RA, improved to 99% on 3 L O2 [place].  Labs showed troponin 143 > 146 > 117, BNP 1031.2, WBC 11.4, hemoglobin 10.5, platelets 507, sodium 133, potassium 3.9, bicarb 21, BUN 15, creatinine 0.64, serum glucose 99.  CT head without contrast showed markedly improved vasogenic edema in the anterior left hemisphere.  Unchanged appearance of 14 mm suspected lesion in the left frontal white matter.  Chronic ischemic white matter changes and mild  volume loss.  CTA chest negative for evidence of PE.  Cardiomegaly, trace right pleural effusion, and patchy groundglass opacities seen.  Patient was given DuoNeb treatment, IV Dilaudid  and fentanyl .  The hospitalist service was consulted for admission.  Review of Systems: All systems reviewed and are negative except as documented in history of present illness above.   Past Medical History:  Diagnosis Date   Allergy    Anxiety    Brain mass    Cancer (HCC)    COPD (chronic obstructive pulmonary disease) (HCC)    Emphysema of lung (HCC)    Hypertension     Past Surgical History:  Procedure Laterality Date   APPLICATION OF CRANIAL NAVIGATION Left 10/07/2023   Procedure: APPLICATION OF CRANIAL NAVIGATION;  Surgeon: Gillie Duncans, MD;  Location: [facility] OR;  Service: Neurosurgery;  Laterality: Left;   CRANIOTOMY Left 10/07/2023   Procedure: FRONTAL CRANIOTOMY FOR ABSCESS;  Surgeon: Gillie Duncans, MD;  Location: [facility] OR;  Service: Neurosurgery;  Laterality: Left;   dental procedure     HUMERUS IM NAIL Right 10/10/2023   Procedure: INTRAMEDULLARY (IM) NAIL HUMERAL RIGHT;  Surgeon: Genelle Standing, MD;  Location: [facility] OR;  Service: Orthopedics;  Laterality: Right;    Social History: Social History   Socioeconomic History   Marital status: Widowed    Spouse name: Not on file   Number of children: 1    Years of education: 93    Highest education level: Not on file  Occupational History   Occupation: Set designer: [employer]    Comment: [employer]  Tobacco Use   Smoking status: Former    Current packs/day: 0.00    Types: Cigarettes    Quit date: 10/31/2019    Years since quitting: 4.4   Smokeless tobacco: Never  Vaping Use   Vaping status: Never Used  Substance and Sexual Activity   Alcohol  use: Yes    Alcohol /week: 6.0 standard drinks of alcohol     Types: 6 Cans of beer per week   Drug use: Yes    Frequency: 4.0 times per week    Types:  Marijuana   Sexual activity: Not Currently  Other Topics Concern   Not on file  Social History Narrative   Lives with her brother.    Son lives near by, 68 yo, married.    Social Drivers of Corporate investment banker Strain: Not on file  Food Insecurity: No Food Insecurity (03/29/2024)   Hunger Vital Sign    Worried About Running Out of Food in the Last Year: Never true    Ran Out of Food in the Last Year: Never true  Recent Concern: Food Insecurity - Food Insecurity Present (02/13/2024)   Hunger Vital Sign    Worried About Running Out of Food in the Last Year: Sometimes true    Ran Out of Food in the Last Year: Sometimes true  Transportation Needs: No Transportation Needs (03/29/2024)   PRAPARE - Administrator, Civil Service (Medical): No    Lack of Transportation (Non-Medical): No  Physical Activity: Not on file  Stress: Not on file  Social Connections: Unknown (03/29/2024)   Social Connection and Isolation Panel    Frequency of Communication with Friends and Family: Twice a week    Frequency of Social Gatherings with Friends and Family: Twice a week    Attends Religious Services: 1 to 4 times per year    Active Member of [employer] or Organizations: Yes    Attends Banker Meetings: 1 to 4 times per year    Marital Status: Not on file  Intimate Partner Violence: Not At Risk (03/29/2024)   Humiliation, Afraid, Rape, and Kick questionnaire    Fear of Current or Ex-Partner: No    Emotionally Abused: No    Physically Abused: No    Sexually Abused: No   Allergies  Allergen Reactions   Pork Allergy Nausea And Vomiting   Bee Venom Hives    Family History  Problem Relation Age of Onset   Leukemia Mother    Hypertension Mother    Hypertension Father    Cancer Neg Hx    Diabetes Neg Hx    Heart disease Neg Hx    Colon cancer Neg Hx    Esophageal cancer Neg Hx    Rectal cancer Neg Hx    Stomach cancer Neg Hx      Prior to Admission medications    Medication Sig Start Date End Date Taking? Authorizing Provider  bisacodyl  (DULCOLAX) 5 MG EC tablet Take 1 tablet (5 mg total) by mouth daily as needed for moderate constipation. 04/04/24   Ghimire, Donalda HERO, MD  dexamethasone  (DECADRON ) 2 MG tablet Take 2 tablets (4 mg total) by mouth daily. 04/04/24   Ghimire, Donalda HERO, MD  furosemide  (LASIX ) 20 MG tablet Take 1 tablet (20 mg total) by mouth daily as needed for fluid or edema. 04/04/24 04/04/25  Ghimire, Donalda HERO, MD  levETIRAcetam  (KEPPRA ) 500 MG tablet Take 2 tablets (1,000 mg total) by mouth 2 (two) times daily. 04/04/24  Ghimire, Donalda HERO, MD  losartan  (COZAAR ) 25 MG tablet Take 1 tablet (25 mg total) by mouth daily. 04/04/24   Ghimire, Donalda HERO, MD  metoprolol  tartrate (LOPRESSOR ) 50 MG tablet Take 1 tablet (50 mg total) by mouth 2 (two) times daily. 04/04/24   Ghimire, Donalda HERO, MD  ondansetron  (ZOFRAN ) 4 MG tablet Take 1 tablet (4 mg total) by mouth every 6 (six) hours as needed for nausea or vomiting. 04/04/24   Ghimire, Donalda HERO, MD  oxyCODONE  (OXYCONTIN ) 10 mg 12 hr tablet Take 1 tablet (10 mg total) by mouth every 12 (twelve) hours. 04/04/24   Ghimire, Donalda HERO, MD  Oxycodone  HCl 10 MG TABS Take 1 tablet (10 mg total) by mouth every 4 (four) hours as needed (severe pain). 04/04/24   Ghimire, Donalda HERO, MD  polyethylene glycol powder (GLYCOLAX /MIRALAX ) 17 GM/SCOOP powder Mix 17 g and take by mouth daily. 04/04/24   Ghimire, Donalda HERO, MD  senna-docusate (SENOKOT-S) 8.6-50 MG tablet Take 2 tablets by mouth at bedtime. 04/04/24   Ghimire, Donalda HERO, MD  sertraline  (ZOLOFT ) 50 MG tablet Take 50 mg by mouth daily. 03/23/24 04/22/24  [provider]    Physical Exam: Vitals:   04/20/24 1809 04/20/24 1809 04/20/24 2145 04/20/24 2242  BP:  114/77 (!) 141/86 106/62  Pulse: 77 77 (!) 124 80  Resp: (!) 22 (!) 22 20 18   Temp:  97.8 F (36.6 C)  98.2 F (36.8 C)  TempSrc:  Oral  Oral  SpO2: 99% 99% 91% 96%   Constitutional: Resting  in bed, NAD, calm, comfortable Eyes: EOMI, lids and conjunctivae normal ENMT: Mucous membranes are moist. Posterior pharynx clear of any exudate or lesions.Normal dentition.  Neck: normal, supple, no masses. Respiratory: clear to auscultation bilaterally, no wheezing, no crackles. Normal respiratory effort while on 3 L O2 [place]. No accessory muscle use.  Cardiovascular: Regular rate and rhythm, no murmurs / rubs / gallops. No extremity edema. 2+ pedal pulses. Abdomen: no tenderness, no masses palpated. Musculoskeletal: no clubbing / cyanosis. No joint deformity upper and lower extremities. Good ROM, no contractures. Normal muscle tone.  Skin: no rashes, lesions, ulcers. No induration Neurologic: Expressive aphasia.  Mostly answers in yes/no fashion but occasional full sentence.  Sensation intact. Strength 5/5 in all 4.  Psychiatric: Normal judgment and insight. Alert and oriented x 3. Normal mood.   EKG: Personally reviewed. Sinus rhythm, rate 75, nonspecific T wave changes throughout.  Previous EKG showed SVT.  Assessment/Plan Principal Problem:   Acute respiratory failure with hypoxia (HCC) Active Problems:   HTN (hypertension)   COPD (chronic obstructive pulmonary disease) (HCC)   Lung cancer metastatic to brain [facility])   Cancer associated pain   History of seizure   Regina [name] is a 58 y.o. 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  Assessment and Plan: Acute hypoxic respiratory failure: Likely multifactorial in setting of lung cancer, COPD, HFpEF.  SpO2 80-85% on room air on arrival to the ED.  Currently stable on 3 L O2 via [place].  CTA chest is negative for PE.  Patchy groundglass opacities seen, appears similar to  prior CTA chest from 03/28/2024.  Trace right pleural effusion is noted.  BNP elevated.  No significant wheezing noted.  No  infectious symptoms.  Will trial Lasix  and monitor response. - IV Lasix  20 mg once now and monitor response - Continue supplemental O2 as needed and wean as able - DuoNebs as needed - Check COVID/influenza/RSV panel  Chronic HFpEF: EF 70-75% by TTE 03/29/2024.  BNP elevated, small right pleural effusion seen on imaging.  Otherwise appears euvolemic.  Troponin was elevated.  She denies chest pain.  Could be demand ischemia from hypoxic respiratory failure. - Giving IV Lasix  20 mg once tonight as above - Continue Lopressor  - Hold losartan  given borderline low blood pressures  Stage IV NSCLC metastatic to brain and bone: Patient currently active with home hospice.  CT head shows significant improvement in vasogenic edema.  Will continue symptom management.  Patient confirms no aggressive management.  DNR/DNI in place. - Continue Decadron  4 mg daily - Continue home pain regimen  COPD: No significant wheezing noted.  Continue DuoNebs as needed.  History of seizures: Continue Keppra .  History of SVT: Continue Lopressor .  Hypertension: Continue Lopressor .  Holding losartan  for now.  Chronic pain: Continue home OxyContin  12 mg every 12 hours and oxycodone  10 mg every 4 hours as needed.    DVT prophylaxis: SCDs Start: 04/20/24 2329 Code Status:   Code Status: Limited: Do not attempt resuscitation (DNR) -DNR-LIMITED -Do Not Intubate/DNI   Family Communication: Discussed with patient, she has discussed with family Disposition Plan: From home and likely discharge to home pending clinical progress Consults called: None Severity of Illness: The appropriate patient status for this patient is INPATIENT. Inpatient status is judged to be reasonable and necessary in order to provide the required intensity of service to ensure the patient's safety. The patient's presenting  symptoms, physical exam findings, and initial radiographic and laboratory data in the context of their chronic comorbidities is felt to place them at high risk for further clinical deterioration. Furthermore, it is not anticipated that the patient will be medically stable for discharge from the hospital within 2 midnights of admission.   * I certify that at the point of admission it is my clinical judgment that the patient will require inpatient hospital care spanning beyond 2 midnights from the point of admission due to high intensity of service, high risk for further deterioration and high frequency of surveillance required.DEWAINE Jorie Blanch MD [facility]  If 7PM-7AM, please contact night-coverage [email or web address]  04/20/2024, 11:46 PM

## 2024-04-20 NOTE — Hospital Course (Addendum)
 58 year old woman complicated PMH including stage IV lung cancer with metastatic disease to the brain, brain abscess, vasogenic edema of brain, seizures presented with shortness of breath.  Admitted for acute hypoxic respiratory failure.  Consultants None   Procedures/Events None

## 2024-04-20 NOTE — ED Provider Notes (Signed)
 [place] EMERGENCY DEPARTMENT AT [facility] Provider Note   CSN: [id number] Arrival date & time: 04/20/24  1755     Patient presents with: Chest Pain   Regina [name] is a 58 y.o. female.   Patient here shortness of breath chest pain.  She had acute onset of chest pain and shortness of breath that started today.  She was hypoxic in triage to 84%.  She has a history of CHF.  History of hypertension anxiety COPD lung cancer.  She is on chemotherapy.  She denies any fevers or chills.  She denies any headache.  Nothing makes it worse or better.  The history is provided by the patient.       Prior to Admission medications   Medication Sig Start Date End Date Taking? Authorizing Provider  bisacodyl  (DULCOLAX) 5 MG EC tablet Take 1 tablet (5 mg total) by mouth daily as needed for moderate constipation. 04/04/24   Ghimire, Donalda HERO, MD  dexamethasone  (DECADRON ) 2 MG tablet Take 2 tablets (4 mg total) by mouth daily. 04/04/24   Ghimire, Donalda HERO, MD  furosemide  (LASIX ) 20 MG tablet Take 1 tablet (20 mg total) by mouth daily as needed for fluid or edema. 04/04/24 04/04/25  Ghimire, Donalda HERO, MD  levETIRAcetam  (KEPPRA ) 500 MG tablet Take 2 tablets (1,000 mg total) by mouth 2 (two) times daily. 04/04/24   Ghimire, Donalda HERO, MD  losartan  (COZAAR ) 25 MG tablet Take 1 tablet (25 mg total) by mouth daily. 04/04/24   Ghimire, Donalda HERO, MD  metoprolol  tartrate (LOPRESSOR ) 50 MG tablet Take 1 tablet (50 mg total) by mouth 2 (two) times daily. 04/04/24   Ghimire, Donalda HERO, MD  ondansetron  (ZOFRAN ) 4 MG tablet Take 1 tablet (4 mg total) by mouth every 6 (six) hours as needed for nausea or vomiting. 04/04/24   Ghimire, Donalda HERO, MD  oxyCODONE  (OXYCONTIN ) 10 mg 12 hr tablet Take 1 tablet (10 mg total) by mouth every 12 (twelve) hours. 04/04/24   Ghimire, Donalda HERO, MD  Oxycodone  HCl 10 MG TABS Take 1 tablet (10 mg total) by mouth every 4 (four) hours as needed (severe pain). 04/04/24   Ghimire,  Donalda HERO, MD  polyethylene glycol powder (GLYCOLAX /MIRALAX ) 17 GM/SCOOP powder Mix 17 g and take by mouth daily. 04/04/24   Ghimire, Donalda HERO, MD  senna-docusate (SENOKOT-S) 8.6-50 MG tablet Take 2 tablets by mouth at bedtime. 04/04/24   Ghimire, Donalda HERO, MD  sertraline  (ZOLOFT ) 50 MG tablet Take 50 mg by mouth daily. 03/23/24 04/22/24  [provider]    Allergies: Pork allergy and Bee venom    Review of Systems  Updated Vital Signs BP 106/62   Pulse 80   Temp 98.2 F (36.8 C) (Oral)   Resp 18   LMP 06/06/2010   SpO2 96%   Physical Exam Vitals and nursing note reviewed.  Constitutional:      General: She is not in acute distress.    Appearance: She is well-developed. She is not ill-appearing.  HENT:     Head: Normocephalic and atraumatic.  Eyes:     Extraocular Movements: Extraocular movements intact.     Conjunctiva/sclera: Conjunctivae normal.     Pupils: Pupils are equal, round, and reactive to light.  Cardiovascular:     Rate and Rhythm: Normal rate and regular rhythm.     Pulses:          Radial pulses are 2+ on the right side and 2+ on the left  side.     Heart sounds: Normal heart sounds. No murmur heard. Pulmonary:     Effort: Pulmonary effort is normal. No respiratory distress.     Breath sounds: Wheezing present.  Abdominal:     Palpations: Abdomen is soft.     Tenderness: There is no abdominal tenderness.  Musculoskeletal:        General: No swelling.     Cervical back: Neck supple.  Skin:    General: Skin is warm and dry.     Capillary Refill: Capillary refill takes less than 2 seconds.  Neurological:     General: No focal deficit present.     Mental Status: She is alert.     Cranial Nerves: No cranial nerve deficit.     Motor: No weakness.  Psychiatric:        Mood and Affect: Mood normal.     (all labs ordered are listed, but only abnormal results are displayed) Labs Reviewed  BASIC METABOLIC PANEL WITH GFR - Abnormal; Notable for the  following components:      Result Value   Sodium 133 (*)    CO2 21 (*)    All other components within normal limits  CBC - Abnormal; Notable for the following components:   WBC 11.4 (*)    RBC 3.76 (*)    Hemoglobin 10.5 (*)    HCT 33.3 (*)    RDW 17.1 (*)    Platelets 507 (*)    All other components within normal limits  BRAIN NATRIURETIC PEPTIDE - Abnormal; Notable for the following components:   B Natriuretic Peptide 1,031.2 (*)    All other components within normal limits  TROPONIN I (HIGH SENSITIVITY) - Abnormal; Notable for the following components:   Troponin I (High Sensitivity) 146 (*)    All other components within normal limits  TROPONIN I (HIGH SENSITIVITY) - Abnormal; Notable for the following components:   Troponin I (High Sensitivity) 143 (*)    All other components within normal limits  TROPONIN I (HIGH SENSITIVITY) - Abnormal; Notable for the following components:   Troponin I (High Sensitivity) 117 (*)    All other components within normal limits  POC OCCULT BLOOD, ED    EKG: EKG Interpretation Date/Time:  Friday April 20 2024 18:08:42 EDT Ventricular Rate:  75 PR Interval:  148 QRS Duration:  81 QT Interval:  416 QTC Calculation: 465 R Axis:   106  Text Interpretation: Sinus rhythm Probable left atrial enlargement Confirmed by Ruthe Cornet [phone]) on 04/20/2024 6:27:29 PM  Radiology: CT Angio Chest PE W and/or Wo Contrast Result Date: 04/20/2024 CLINICAL DATA:  Pulmonary embolism (PE) suspected, high prob. Chest pain, shortness of breath EXAM: CT ANGIOGRAPHY CHEST WITH CONTRAST TECHNIQUE: Multidetector CT imaging of the chest was performed using the standard protocol during bolus administration of intravenous contrast. Multiplanar CT image reconstructions and MIPs were obtained to evaluate the vascular anatomy. RADIATION DOSE REDUCTION: This exam was performed according to the departmental dose-optimization program which includes automated exposure  control, adjustment of the mA and/or kV according to patient size and/or use of iterative reconstruction technique. CONTRAST:  75mL OMNIPAQUE  IOHEXOL  350 MG/ML SOLN COMPARISON:  CT 03/28/2024 FINDINGS: Cardiovascular: Cardiomegaly. No evidence of aortic aneurysm. No filling defects in the pulmonary arteries to suggest pulmonary emboli. Scattered aortic calcifications. Mediastinum/Nodes: No mediastinal, hilar, or axillary adenopathy. Trachea and esophagus are unremarkable. Thyroid  unremarkable. Lungs/Pleura: Biapical scarring. Centrilobular and paraseptal emphysema. Ground-glass opacities within the lungs could reflect early infectious or  inflammatory process or areas of air trapping. Trace right pleural effusion. Upper Abdomen: No acute findings Musculoskeletal: Chest wall soft tissues are unremarkable. No acute bony abnormality. Review of the MIP images confirms the above findings. IMPRESSION: No evidence of pulmonary embolus. Cardiomegaly. Trace right pleural effusion. Patchy ground-glass opacities within the lungs could reflect infectious/inflammatory process or air trapping. Aortic Atherosclerosis (ICD10-I70.0) and Emphysema (ICD10-J43.9). Electronically Signed   By: Franky Crease M.D.   On: 04/20/2024 22:53   CT Head Wo Contrast Result Date: 04/20/2024 EXAM: CT HEAD WITHOUT CONTRAST 04/20/2024 09:40:59 PM TECHNIQUE: CT of the head was performed without the administration of intravenous contrast. Automated exposure control, iterative reconstruction, and/or weight based adjustment of the mA/kV was utilized to reduce the radiation dose to as low as reasonably achievable. COMPARISON: 03/28/2024 CLINICAL HISTORY: Headache, increasing frequency or severity. Patient c/o chest pain and SOB x 1 day. Patient report taking PRN medication without relief. Patient was 80-85% on RA at triage, [facility] started. Patient report nausea denies vomiting. Patient denies fever at home. Hx Lung Ca with mets to brain. FINDINGS: BRAIN AND  VENTRICLES: No acute hemorrhage. Gray-white differentiation is preserved. No hydrocephalus. No extra-axial collection. No mass effect or midline shift. Markedly improved vasogenic edema in the anterior left hemisphere. Unchanged appearance of 14 mm suspected lesion in the left frontal white matter. Chronic ischemic white matter changes and mild volume loss. ORBITS: No acute abnormality. SINUSES: No acute abnormality. SOFT TISSUES AND SKULL: No acute soft tissue abnormality. Left frontal craniotomy and right frontal craniectomy. IMPRESSION: 1. Markedly improved vasogenic edema in the anterior left hemisphere. 2. Unchanged appearance of 14 mm suspected lesion in the left frontal white matter. 3. Chronic ischemic white matter changes and mild volume loss. Electronically signed by: Franky Stanford MD 04/20/2024 10:21 PM EDT RP Workstation: [id number]   DG Chest Port 1 View Result Date: 04/20/2024 CLINICAL DATA:  Chest pain and shortness of breath x1 day. EXAM: PORTABLE CHEST 1 VIEW COMPARISON:  March 28, 2024 FINDINGS: The heart size and mediastinal contours are within normal limits. Mild scarring, atelectasis and fibrotic changes are seen within the bilateral lung bases. Mild biapical scarring, atelectasis and pleural thickening is also seen. No pleural effusion or pneumothorax is identified. Prior ORIF of a mid right humeral fracture is seen. IMPRESSION: Mild bibasilar scarring, atelectasis and fibrotic changes. Electronically Signed   By: Suzen Dials M.D.   On: 04/20/2024 19:29     Procedures   Medications Ordered in the ED  ipratropium-albuterol  (DUONEB) 0.5-2.5 (3) MG/3ML nebulizer solution 3 mL (3 mLs Nebulization Given 04/20/24 1946)  fentaNYL  (SUBLIMAZE ) injection 50 mcg (50 mcg Intravenous Given 04/20/24 1942)  iohexol  (OMNIPAQUE ) 350 MG/ML injection 75 mL (75 mLs Intravenous Contrast Given 04/20/24 2129)  HYDROmorphone  (DILAUDID ) injection 1 mg (1 mg Intravenous Given 04/20/24 2237)                                     Medical Decision Making Amount and/or Complexity of Data Reviewed Labs: ordered. Radiology: ordered.  Risk Prescription drug management. Decision regarding hospitalization.   Channell Gamero is here with chest pain shortness of breath.  Hypoxic upon arrival here to the ED to 84%.  On 4 L of oxygen.  She is tachypneic.  History of lung cancer undergoing chemotherapy.  Overall differential could be PE ACS infectious process reactive airway process.  She is neurologically intact.  Sounds like she  has history of metastasis to the brain as well.  Will get CBC BMP troponin BNP chest x-ray CT scan of chest CT scan of head.  Will give breathing treatment and IV fentanyl  for pain.  EKG shows sinus rhythm.  T wave inversions throughout.  Chest x-ray shows no evidence of pneumonia or pneumothorax.  Overall lab work shows hemoglobin of 10 which is down a couple points from recent but Hemoccult negative.  Troponin was 143 and 117.  BNP was 1000.  PE scan showed no evidence of PE.  Did show small pleural effusion.  Did show patchy groundglass opacities in the lungs which could be infection inflammatory or air trapping.  Ultimately patient with multifactorial shortness of breath and hypoxia.  She is following with hospice.  She does have new oxygen requirement.  I think she would benefit from diuresis breathing treatments and to make sure she has oxygen available for home.  I think she benefit from further pain management as well.  Patient admitted to hospital service for further care.  This chart was dictated using voice recognition software.  Despite best efforts to proofread,  errors can occur which can change the documentation meaning.      Final diagnoses:  Acute respiratory failure with hypoxia [facility])    ED Discharge Orders     None          Ruthe Cornet, DO 04/20/24 2300

## 2024-04-21 DIAGNOSIS — C7931 Secondary malignant neoplasm of brain: Secondary | ICD-10-CM

## 2024-04-21 DIAGNOSIS — J42 Unspecified chronic bronchitis: Secondary | ICD-10-CM

## 2024-04-21 DIAGNOSIS — J9601 Acute respiratory failure with hypoxia: Secondary | ICD-10-CM | POA: Diagnosis not present

## 2024-04-21 DIAGNOSIS — C3490 Malignant neoplasm of unspecified part of unspecified bronchus or lung: Secondary | ICD-10-CM | POA: Diagnosis not present

## 2024-04-21 LAB — BASIC METABOLIC PANEL WITH GFR
Anion gap: 10 (ref 5–15)
BUN: 12 mg/dL (ref 6–20)
CO2: 22 mmol/L (ref 22–32)
Calcium: 8.4 mg/dL — ABNORMAL LOW (ref 8.9–10.3)
Chloride: 101 mmol/L (ref 98–111)
Creatinine, Ser: 0.62 mg/dL (ref 0.44–1.00)
GFR, Estimated: >60 mL/min (ref >60)
Glucose, Bld: 102 mg/dL — ABNORMAL HIGH (ref 70–99)
Potassium: 3.2 mmol/L — ABNORMAL LOW (ref 3.5–5.1)
Sodium: 133 mmol/L — ABNORMAL LOW (ref 135–145)

## 2024-04-21 LAB — RESP PANEL BY RT-PCR (RSV, FLU A&B, COVID)  RVPGX2
Influenza A by PCR: NEGATIVE
Influenza B by PCR: NEGATIVE
Resp Syncytial Virus by PCR: NEGATIVE
SARS Coronavirus 2 by RT PCR: NEGATIVE

## 2024-04-21 LAB — CBC
HCT: 28.8 % — ABNORMAL LOW (ref 36.0–46.0)
Hemoglobin: 9.2 g/dL — ABNORMAL LOW (ref 12.0–15.0)
MCH: 28.6 pg (ref 26.0–34.0)
MCHC: 31.9 g/dL (ref 30.0–36.0)
MCV: 89.4 fL (ref 80.0–100.0)
Platelets: 450 K/uL — ABNORMAL HIGH (ref 150–400)
RBC: 3.22 MIL/uL — ABNORMAL LOW (ref 3.87–5.11)
RDW: 17.1 % — ABNORMAL HIGH (ref 11.5–15.5)
WBC: 10.7 K/uL — ABNORMAL HIGH (ref 4.0–10.5)
nRBC: 0 % (ref 0.0–0.2)

## 2024-04-21 MED ORDER — POTASSIUM CHLORIDE CRYS ER 20 MEQ PO TBCR
40.0000 meq | EXTENDED_RELEASE_TABLET | Freq: Once | ORAL | Status: AC
  Administered 2024-04-21: 40 meq via ORAL
  Filled 2024-04-21: qty 2

## 2024-04-21 NOTE — Progress Notes (Signed)
  Progress Note   Patient: Regina [name] FMW:[id number] DOB: [birth year] DOA: 04/20/2024     1 DOS: the patient was seen and examined on 04/21/2024   Brief hospital course: 58 year old woman PMH including stage IV lung cancer presented with shortness of breath.  Found to be hypoxic and started on oxygen.  Consultants None   Procedures/Events None    Assessment and Plan: Acute hypoxic respiratory failure Emphysema COPD Lung cancer Presented with shortness of breath and hypoxia, required supplemental oxygen.  CT chest negative for PE.   Given a dose of Lasix .  BNP elevated but no pulmonary edema or peripheral edema noted.   COVID/influenza/RSV negative May be secondary to underlying disease   Demand ischemia  Chronic HFpEF EF 70-75% by TTE 03/29/2024.  EKG nonacute.  BNP elevated, small right pleural effusion seen on imaging.  Otherwise appears euvolemic.   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  CT head showed significant improvement in vasogenic edema.  Will continue symptom management.  Patient confirms no aggressive management.  DNR/DNI in place. Continue Decadron  4 mg daily Continue home pain regimen   COPD Emphysema No significant wheezing noted.  Continue DuoNebs as needed.   Seizures Continue Keppra .   History of SVT Continue Lopressor .   Hypertension Continue Lopressor .  Holding losartan  for now.   Chronic pain Continue home OxyContin  12 mg every 12 hours and oxycodone  10 mg every 4 hours as needed.  Aortic atherosclerosis    Subjective:  Feels about the same, remains short of breath.  Not on oxygen at home.  Physical Exam: Vitals:   04/21/24 0439 04/21/24 0834 04/21/24 1353 04/21/24 2015  BP: 110/77 113/60 126/85 115/72  Pulse: 94 84  90  Resp: 20 20 (!) 24 18  Temp: 98.9 F (37.2 C) 98.9 F (37.2 C) 98.8 F (37.1 C) 98.1 F (36.7 C)   TempSrc: Oral Oral Oral Oral  SpO2: 98% 100% 96% (!) 88%  Weight: 49.1 kg     Height:       Physical Exam Vitals reviewed.  Constitutional:      General: She is not in acute distress.    Appearance: She is not ill-appearing or toxic-appearing.  Cardiovascular:     Rate and Rhythm: Normal rate and regular rhythm.     Heart sounds: No murmur heard. Pulmonary:     Effort: Pulmonary effort is normal. No respiratory distress.     Breath sounds: No wheezing, rhonchi or rales.  Neurological:     Mental Status: She is alert.  Psychiatric:        Mood and Affect: Mood normal.        Behavior: Behavior normal.     Data Reviewed: Potassium 3.2. WBC down slightly down 0.7 Hemoglobin 9.2 Influenza, RSV, SARS-CoV-2 negative Troponins elevated 146, 117 BNP elevated 1031  Family Communication: none  Disposition: Status is: Inpatient Remains inpatient appropriate because: hypoxia     Time spent: 35 minutes  Author: Toribio Door, MD 04/21/2024 8:24 PM  For on call review [email or web address].

## 2024-04-21 NOTE — Plan of Care (Signed)

## 2024-04-22 DIAGNOSIS — I2489 Other forms of acute ischemic heart disease: Secondary | ICD-10-CM

## 2024-04-22 DIAGNOSIS — J9601 Acute respiratory failure with hypoxia: Secondary | ICD-10-CM | POA: Diagnosis not present

## 2024-04-22 DIAGNOSIS — C3490 Malignant neoplasm of unspecified part of unspecified bronchus or lung: Secondary | ICD-10-CM | POA: Diagnosis not present

## 2024-04-22 DIAGNOSIS — J42 Unspecified chronic bronchitis: Secondary | ICD-10-CM | POA: Diagnosis not present

## 2024-04-22 MED ORDER — FUROSEMIDE 20 MG PO TABS
20.0000 mg | ORAL_TABLET | Freq: Once | ORAL | Status: AC
  Administered 2024-04-22: 20 mg via ORAL
  Filled 2024-04-22: qty 1

## 2024-04-22 NOTE — Progress Notes (Signed)
 Regina [name] [facility] Liaison Note   Regina [name] is a current hospice patient with a terminal diagnosis of malignant neoplasm of unspecified part of right bronchus or lung.  Patient's niece called [facility] after hours on 8.16.25 and reported that patent had been admitted to hospital on 8.15.15 and she was uncertain of patients condition or why she was taken. Per hospital documentation, patient was admitted on 8.15.25 for Acute hypoxic respiratory failure. Per Dr. Deane Finder with [facility], this is a related hospital admission. Patient is a DNR.  Visited patient at bedside. She was alert and watching television. Patient stated she was still experiencing some shortness of breath and noted to have on [place]. Denies any pain at this time.      Pt is inpatient appropriate due to symptom management of shortness of breathing requiring IV medications.   VS: 98.3/72/not documented   140/93    95% on 2 lpm [place]   I/O: 120/not documented   Abnormal Labs:  04/20/24 19:40 Basic metabolic panel with GFR: Rpt ! Sodium: 133 (L) CO2: 21 (L) B Natriuretic Peptide: 1,031.2 (H) Troponin I (High Sensitivity): 146 (HH)    143 (HH) WBC: 11.4 (H) RBC: 3.76 (L) Hemoglobin: 10.5 (L) HCT: 33.3 (L) RDW: 17.1 (H) Platelets: 507 (H)  04/20/24 21:43 Troponin I (High Sensitivity): 117 (HH)  04/21/24 05:35 Basic metabolic panel with GFR: Rpt ! Sodium: 133 (L) Potassium: 3.2 (L) Glucose: 102 (H) Calcium : 8.4 (L) WBC: 10.7 (H) RBC: 3.22 (L) Hemoglobin: 9.2 (L) HCT: 28.8 (L) RDW: 17.1 (H) Platelets: 450 (H)  Diagnostics:  DG Chest Port 1 View (Accession [id number]) [phone] [id number]) Imaging Date: 04/20/2024 Narrative & Impression CLINICAL DATA:  Chest pain and shortness of breath x1 day.   EXAM: PORTABLE CHEST 1 VIEW   COMPARISON:  March 28, 2024   FINDINGS: The heart size and mediastinal contours are within normal limits. Mild scarring, atelectasis and fibrotic  changes are seen within the bilateral lung bases. Mild biapical scarring, atelectasis and pleural thickening is also seen. No pleural effusion or pneumothorax is identified. Prior ORIF of a mid right humeral fracture is seen.   IMPRESSION: Mild bibasilar scarring, atelectasis and fibrotic changes.     Electronically Signed   By: Suzen Dials M.D.   On: 04/20/2024 19:29  CT Angio Chest PE W and/or Wo Contrast (Accession [id number]) (Order [id number]) Imaging Date: 04/20/2024  Narrative & Impression CLINICAL DATA:  Pulmonary embolism (PE) suspected, high prob. Chest pain, shortness of breath   EXAM: CT ANGIOGRAPHY CHEST WITH CONTRAST   TECHNIQUE: Multidetector CT imaging of the chest was performed using the standard protocol during bolus administration of intravenous contrast. Multiplanar CT image reconstructions and MIPs were obtained to evaluate the vascular anatomy.   RADIATION DOSE REDUCTION: This exam was performed according to the departmental dose-optimization program which includes automated exposure control, adjustment of the mA and/or kV according to patient size and/or use of iterative reconstruction technique.   CONTRAST:  75mL OMNIPAQUE  IOHEXOL  350 MG/ML SOLN   COMPARISON:  CT 03/28/2024   FINDINGS: Cardiovascular: Cardiomegaly. No evidence of aortic aneurysm. No filling defects in the pulmonary arteries to suggest pulmonary emboli. Scattered aortic calcifications.   Mediastinum/Nodes: No mediastinal, hilar, or axillary adenopathy. Trachea and esophagus are unremarkable. Thyroid  unremarkable.   Lungs/Pleura: Biapical scarring. Centrilobular and paraseptal emphysema. Ground-glass opacities within the lungs could reflect early infectious or inflammatory process or areas of air trapping. Trace right pleural effusion.   Upper Abdomen: No acute  findings   Musculoskeletal: Chest wall soft tissues are unremarkable. No acute bony abnormality.   Review of  the MIP images confirms the above findings.   IMPRESSION: No evidence of pulmonary embolus.   Cardiomegaly.   Trace right pleural effusion.   Patchy ground-glass opacities within the lungs could reflect infectious/inflammatory process or air trapping.   Aortic Atherosclerosis (ICD10-I70.0) and Emphysema (ICD10-J43.9).     Electronically Signed   By: Franky Crease M.D.   On: 04/20/2024 22:53   Narrative & Impression EXAM: CT HEAD WITHOUT CONTRAST 04/20/2024 09:40:59 PM   TECHNIQUE: CT of the head was performed without the administration of intravenous contrast. Automated exposure control, iterative reconstruction, and/or weight based adjustment of the mA/kV was utilized to reduce the radiation dose to as low as reasonably achievable.   COMPARISON: 03/28/2024   CLINICAL HISTORY: Headache, increasing frequency or severity. Patient c/o chest pain and SOB x 1 day. Patient report taking PRN medication without relief. Patient was 80-85% on RA at triage, [facility] started. Patient report nausea denies vomiting. Patient denies fever at home. Hx Lung Ca with mets to brain.   FINDINGS:   BRAIN AND VENTRICLES: No acute hemorrhage. Gray-white differentiation is preserved. No hydrocephalus. No extra-axial collection. No mass effect or midline shift. Markedly improved vasogenic edema in the anterior left hemisphere. Unchanged appearance of 14 mm suspected lesion in the left frontal white matter. Chronic ischemic white matter changes and mild volume loss.   ORBITS: No acute abnormality.   SINUSES: No acute abnormality.   SOFT TISSUES AND SKULL: No acute soft tissue abnormality. Left frontal craniotomy and right frontal craniectomy.   IMPRESSION: 1. Markedly improved vasogenic edema in the anterior left hemisphere. 2. Unchanged appearance of 14 mm suspected lesion in the left frontal white matter. 3. Chronic ischemic white matter changes and mild volume loss.   Electronically  signed by: Franky Stanford MD 04/20/2024 10:21 PM EDT RP Workstation: [id number]    IV/PRN meds: Fentanyl  50mcg IV x1, Lasix  20mg  IV x1, Dilaudid  1mg  IV x1, Tylenol  650mg  PO x1, Oxycodone  10mg  PO x2,    Assessment and Plan per Toribio Door, MD 8.16.25  Acute hypoxic respiratory failure Emphysema COPD Lung cancer Presented with shortness of breath and hypoxia, required supplemental oxygen.  CT chest negative for PE.   Given a dose of Lasix .  BNP elevated but no pulmonary edema or peripheral edema noted.   COVID/influenza/RSV negative May be secondary to underlying disease   Demand ischemia  Chronic HFpEF EF 70-75% by TTE 03/29/2024.  EKG nonacute.  BNP elevated, small right pleural effusion seen on imaging.  Otherwise appears euvolemic.   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  CT head showed significant improvement in vasogenic edema.  Will continue symptom management.  Patient confirms no aggressive management.  DNR/DNI in place. Continue Decadron  4 mg daily Continue home pain regimen   COPD Emphysema No significant wheezing noted.  Continue DuoNebs as needed.   Seizures Continue Keppra .   History of SVT Continue Lopressor .   Hypertension Continue Lopressor .  Holding losartan  for now.   Chronic pain Continue home OxyContin  12 mg every 12 hours and oxycodone  10 mg every 4 hours as needed.   Aortic atherosclerosis    Discharge Planning: Ongoing, back home when medically stable   Family Contact:  called niece [name], message left. Awaiting call back.   IDT: updated   Goals of  Care: DNR- Limited  Transfer summary and Med list sent to be placed under Media tab.     Should patient need ambulance transport at discharge, please call [facility] as we contract with them for our active hospice patients.   Please call with any hospice questions or concerns.   Thank  you, Nat Babe, BSN, [facility] [phone]

## 2024-04-22 NOTE — Progress Notes (Addendum)
  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  Admitted for acute hypoxic respiratory failure.  Consultants None   Procedures/Events None   Assessment and Plan: Acute hypoxic respiratory failure Emphysema COPD Metastatic lung cancer Presented with shortness of breath and hypoxia, required supplemental oxygen.  CT chest negative for PE.   Elevated BNP and troponins noted. COVID/influenza/RSV negative Asymptomatic but remains definitely hypoxic off oxygen.   Demand ischemia  Chronic HFpEF EF 70-75% by TTE 03/29/2024.  EKG nonacute.  BNP elevated, small right pleural effusion seen on imaging.  Otherwise appears euvolemic.   Continue Lopressor  Presenting with chest pain, head somewhat elevated troponins which are new and marked BNP elevation which is new.  Some concern for possible cardiac event in retrospect.  Check a limited echo.   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  CT head showed significant improvement in vasogenic edema.  Continue symptom management.  Patient confirms no aggressive management.  DNR/DNI in place. Continue Decadron  4 mg daily Continue home pain regimen  Acute normocytic anemia Baseline around 13-14.  14 late July.  At time of admission 10.5, 9.2 yesterday. No evidence of bleeding.  Etiology unclear.  Check CBC in AM.   COPD Emphysema No significant wheezing noted.  Continue DuoNebs as needed.   Seizures Continue Keppra .   History of SVT Continue Lopressor .   Hypertension Continue Lopressor .  Holding losartan  for now.   Chronic pain Continue home OxyContin  12 mg  every 12 hours and oxycodone  10 mg every 4 hours as needed.   Aortic atherosclerosis       Subjective:  Got hypoxic to the 60s when off oxygen Comfortable on 2-3L Feels ok  Physical Exam: Vitals:   04/22/24 1000 04/22/24 1003 04/22/24 1005 04/22/24 1335  BP:    110/72  Pulse: 85 81 79 74  Resp:    16  Temp:    98.1 F (36.7 C)  TempSrc:    Oral  SpO2: (!) 68% 91% 93% 92%  Weight:      Height:       Physical Exam Vitals reviewed.  Constitutional:      General: She is not in acute distress.    Appearance: She is not ill-appearing or toxic-appearing.  Cardiovascular:     Rate and Rhythm: Normal rate and regular rhythm.     Heart sounds: No murmur heard. Pulmonary:     Effort: Pulmonary effort is normal. No respiratory distress.     Breath sounds: No wheezing, rhonchi or rales.  Neurological:     Mental Status: She is alert.  Psychiatric:        Mood and Affect: Mood normal.        Behavior: Behavior normal.     Data Reviewed: No new data  Family Communication: none  Disposition: Status is: Inpatient Remains inpatient appropriate because: hypoxia     Time spent: 20 minutes  Author: Toribio Door, MD 04/22/2024 5:44 PM  For on call review [email or web address].

## 2024-04-22 NOTE — Plan of Care (Signed)
  Problem: Clinical Measurements: Goal: Ability to maintain clinical measurements within normal limits will improve Outcome: Progressing   Problem: Pain Managment: Goal: General experience of comfort will improve and/or be controlled Outcome: Progressing   Problem: Safety: Goal: Ability to remain free from injury will improve Outcome: Progressing

## 2024-04-22 NOTE — Progress Notes (Signed)
 O2 Sats dropped to 68% RA at rest. 2L/[place] reapplied- Sats increased to 92%. O2 Sats 93% on 3L/[place]. MD Jadine aware.

## 2024-04-23 ENCOUNTER — Inpatient Hospital Stay (HOSPITAL_COMMUNITY): Payer: MEDICAID

## 2024-04-23 DIAGNOSIS — C3490 Malignant neoplasm of unspecified part of unspecified bronchus or lung: Secondary | ICD-10-CM | POA: Diagnosis not present

## 2024-04-23 DIAGNOSIS — C7931 Secondary malignant neoplasm of brain: Secondary | ICD-10-CM | POA: Diagnosis not present

## 2024-04-23 DIAGNOSIS — J9601 Acute respiratory failure with hypoxia: Secondary | ICD-10-CM | POA: Diagnosis not present

## 2024-04-23 DIAGNOSIS — R7989 Other specified abnormal findings of blood chemistry: Secondary | ICD-10-CM | POA: Diagnosis not present

## 2024-04-23 DIAGNOSIS — J42 Unspecified chronic bronchitis: Secondary | ICD-10-CM | POA: Diagnosis not present

## 2024-04-23 LAB — CBC
HCT: 29.3 % — ABNORMAL LOW (ref 36.0–46.0)
Hemoglobin: 9.5 g/dL — ABNORMAL LOW (ref 12.0–15.0)
MCH: 29.5 pg (ref 26.0–34.0)
MCHC: 32.4 g/dL (ref 30.0–36.0)
MCV: 91 fL (ref 80.0–100.0)
Platelets: 545 K/uL — ABNORMAL HIGH (ref 150–400)
RBC: 3.22 MIL/uL — ABNORMAL LOW (ref 3.87–5.11)
RDW: 17.1 % — ABNORMAL HIGH (ref 11.5–15.5)
WBC: 7.8 K/uL (ref 4.0–10.5)
nRBC: 0 % (ref 0.0–0.2)

## 2024-04-23 LAB — ECHOCARDIOGRAM LIMITED
Calc EF: 76.8 %
Height: 65 in
S' Lateral: 1.7 cm
Single Plane A2C EF: 81 %
Single Plane A4C EF: 72.1 %
Weight: 1763.68 [oz_av]

## 2024-04-23 MED ORDER — SERTRALINE HCL 50 MG PO TABS: 50.0000 mg | ORAL_TABLET | Freq: Every day | ORAL | Status: AC

## 2024-04-23 NOTE — TOC Initial Note (Signed)
 Transition of Care [facility]) - Initial/Assessment Note   Patient Details  Name: Regina [name] MRN: [id number] Date of Birth: 1966/02/05  Transition of Care [facility] CM/SW Contact:    Duwaine GORMAN Aran, LCSW Phone Number: 04/23/2024, 10:20 AM  Clinical Narrative: Care management consulted for possible home oxygen needs. Patient is currently active with [facility] hospice. CSW followed up with Melissa and Amy with [facility] regarding home oxygen needs. [facility] to follow up with patient today and oxygen will be set up if needed at discharge.  Expected Discharge Plan: Home w Hospice Care Barriers to Discharge: Continued Medical Work up  Patient Goals and CMS Choice Choice offered to / list presented to : NA  Expected Discharge Plan and Services In-house Referral: Clinical Social Work Post Acute Care Choice: NA Living arrangements for the past 2 months: Apartment           DME Arranged: Oxygen DME Agency: Other - Comment ([facility] to set up home oxygen) Date DME Agency Contacted: 04/23/24 Representative spoke with at DME Agency: Amy  Prior Living Arrangements/Services Living arrangements for the past 2 months: Apartment Lives with:: Siblings, Adult Children Patient language and need for interpreter reviewed:: Yes Do you feel safe going back to the place where you live?: Yes      Need for Family Participation in Patient Care: Yes (Comment) Care giver support system in place?: Yes (comment) Current home services: Hospice (Active with [facility] hospice) Criminal Activity/Legal Involvement Pertinent to Current Situation/Hospitalization: No - Comment as needed  Activities of Daily Living ADL Screening (condition at time of admission) Independently performs ADLs?: Yes (appropriate for developmental age) Is the patient deaf or have difficulty hearing?: No Does the patient have difficulty seeing, even when wearing glasses/contacts?: No Does the patient have difficulty concentrating,  remembering, or making decisions?: No  Emotional Assessment Orientation: : Oriented to Self, Oriented to Place, Oriented to  Time, Oriented to Situation Alcohol  / Substance Use: Not Applicable Psych Involvement: No (comment)  Admission diagnosis:  Acute respiratory failure with hypoxia (HCC) [J96.01] Patient Active Problem List   Diagnosis Date Noted   Acute respiratory failure with hypoxia (HCC) 04/20/2024   SVT (supraventricular tachycardia) (HCC) 03/28/2024   History of seizure 03/28/2024   Shortness of breath 03/09/2024   Bilateral leg edema 03/08/2024   Aphasia 02/22/2024   Hypertension 02/13/2024   Encounter for antineoplastic immunotherapy 02/13/2024   Left leg weakness 02/12/2024   Dysarthria 02/12/2024   Arm pain, diffuse, right- due to metastatic lung cancer 02/06/2024   Cancer-related breakthrough pain 12/19/2023   High risk medication use 12/19/2023   Non-small cell lung cancer (HCC) 12/19/2023   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   Medication management 12/19/2023   Cancer associated pain 12/19/2023   Expressive aphasia 12/13/2023   SIRS (systemic inflammatory response syndrome) (HCC) 12/13/2023   Lactic acidosis 12/13/2023   History of COVID-19 12/13/2023   Metabolic acidosis with increased anion gap and accumulation of organic acids 12/13/2023   Pathologic fracture 11/22/2023   Hypokalemia 11/22/2023   Closed fracture of shaft of right humerus 10/10/2023   Pressure injury of skin 10/10/2023   Cerebral intracranial abscess 10/07/2023   Palliative care encounter 10/04/2023   Pathological fracture of right humerus with delayed healing 10/04/2023   Lung cancer metastatic to brain (HCC) 10/04/2023   Vasogenic brain edema (HCC) 10/04/2023   Counseling and coordination of care 10/04/2023   Protein-calorie malnutrition, severe 10/04/2023   Metastasis to bone (HCC) 10/03/2023   Cerebral edema (HCC) 10/01/2023  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   Pathological fracture in neoplastic disease, right humerus, initial encounter for fracture 09/19/2023   Neoplasm causing mass effect and brain compression on adjacent structures (HCC) 09/18/2023   COPD (chronic obstructive pulmonary disease) (HCC)    Anxiety    Hyponatremia 04/16/2018   Depression 04/16/2018   Hypertensive urgency    HTN (hypertension) 01/13/2016   Right anterior shoulder pain 07/18/2014   Postconcussion syndrome 05/28/2014   PCP:  Celestia Rosaline SQUIBB, NP Pharmacy:   [facility] [phone] - [place], [place] - [address] AT [facility] [address] [place] [address] Phone: [phone] Fax: [phone]  [facility] [address], [address] [place] [address] Phone: [phone] Fax: [phone]  [facility] [address] [place] [address] Phone: [phone] Fax: [phone]  [facility] [address] [place] [address] Phone: [phone] Fax: [phone]  Social Drivers of Health (SDOH) Social History: SDOH Screenings   Food Insecurity: No Food Insecurity (04/21/2024)  Recent Concern: Food Insecurity - Food Insecurity Present (02/13/2024)  Housing: Low Risk  (04/21/2024)  Recent Concern: Housing - High Risk (03/09/2024)  Transportation Needs: No Transportation Needs (04/21/2024)  Utilities: Not At Risk (04/21/2024)  Recent Concern: Utilities - At Risk (03/09/2024)  Depression (PHQ2-9): Low Risk  (01/16/2024)  Social Connections: Unknown (03/29/2024)  Tobacco Use: Medium Risk (04/20/2024)   SDOH Interventions:    Readmission Risk Interventions    04/23/2024   10:17 AM 03/29/2024    3:29 PM 02/07/2024    3:53 PM  Readmission Risk Prevention Plan  Transportation Screening Complete Complete Complete  Medication Review (RN Care Manager) Complete Complete Referral to  Pharmacy  PCP or Specialist appointment within 3-5 days of discharge  Complete Complete  HRI or Home Care Consult Complete Complete Complete  SW Recovery Care/Counseling Consult Complete Complete Complete  Palliative Care Screening Not Applicable Complete Complete  Skilled Nursing Facility Not Applicable Not Applicable Not Applicable

## 2024-04-23 NOTE — Progress Notes (Signed)
 Mobility Specialist - Progress Note   04/23/24 0905  Mobility  Activity Ambulated with assistance  Level of Assistance Standby assist, set-up cues, supervision of patient - no hands on  Assistive Device None  Distance Ambulated (ft) 3 ft  Range of Motion/Exercises Active  Activity Response Tolerated well  Mobility Referral Yes  Mobility visit 1 Mobility  Mobility Specialist Start Time (ACUTE ONLY) 0855  Mobility Specialist Stop Time (ACUTE ONLY) 0905  Mobility Specialist Time Calculation (min) (ACUTE ONLY) 10 min   Pt was found in bed and agreeable to transfer to recliner chair. Was left with all needs met. NT in room.  Erminio Leos,  Mobility Specialist Can be reached via Secure Chat

## 2024-04-23 NOTE — Progress Notes (Signed)
 Regina [name] [address] [facility] Regina [name] Note  Regina [name] is a current hospice patient with Regina [name] with a diagnosis of malignant neoplasm of the right lung. Patient's niece notified [facility] after hours on 8.16.25 that the patient was admitted to the [name] on 8.15 but she was not certain of the patient's condition or why she went to the ED. Per pts chart, she was admitted due to acute hypoxic respiratory failure. Per Dr. Deane [name] with Regina [name], this is a related [name] admission.   Went to visit patient this morning. She was alert and oriented, stating that she was having some generalized pain. She also said that she did get short of breath at times. She stated that she is ready to go home.   Regina [name] is inpatient appropriate due to symptom management of dyspnea requiring IV medications.  VS: 98.2, 72, 115/72, 18, SP02 96% on 3L 02  I/O: 1200/not documented  Abnormal labs:   8.18.25 04:45am RBC 3.22 (L) Hgb 9.5 (L)  Hct 29.3 (L) RDW 17.1 (H) Platelets 545 (H)  Diagnostics:  ECHO - results pending  Assessment and Plan per Regina Door, MD 8.17.25 :  Acute hypoxic respiratory failure Emphysema COPD Metastatic lung cancer Presented with shortness of breath and hypoxia, required supplemental oxygen.  CT chest negative for PE.   Elevated BNP and troponins noted. COVID/influenza/RSV negative Asymptomatic but remains definitely hypoxic off oxygen.   Demand ischemia  Chronic HFpEF EF 70-75% by TTE 03/29/2024.  EKG nonacute.  BNP elevated, small right pleural effusion seen on imaging.  Otherwise appears euvolemic.   Continue Lopressor  Presenting with chest pain, head somewhat elevated troponins which are new and marked BNP elevation which is new.  Some concern for possible cardiac event in retrospect.  Check a limited echo.   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  CT  head showed significant improvement in vasogenic edema.  Continue symptom management.  Patient confirms no aggressive management.  DNR/DNI in place. Continue Decadron  4 mg daily Continue home pain regimen   Acute normocytic anemia Baseline around 13-14.  14 late July.  At time of admission 10.5, 9.2 yesterday. No evidence of bleeding.  Etiology unclear.  Check CBC in AM.   COPD Emphysema No significant wheezing noted.  Continue DuoNebs as needed.   Seizures Continue Keppra .   History of SVT Continue Lopressor .   Hypertension Continue Lopressor .  Holding losartan  for now.   Chronic pain Continue home OxyContin  12 mg every 12 hours and oxycodone  10 mg every 4 hours as needed.   Aortic atherosclerosis             Subjective:  Got hypoxic to the 60s when off oxygen Comfortable on 2-3L Feels ok   Physical Exam:       Vitals:    04/22/24 1000 04/22/24 1003 04/22/24 1005 04/22/24 1335  BP:       110/72  Pulse: 85 81 79 74  Resp:       16  Temp:       98.1 F (36.7 C)  TempSrc:       Oral  SpO2: (!) 68% 91% 93% 92%  Weight:          Height:            Physical Exam Vitals reviewed.  Constitutional:      General: She is not in acute distress.    Appearance: She is  not ill-appearing or toxic-appearing.  Cardiovascular:     Rate and Rhythm: Normal rate and regular rhythm.     Heart sounds: No murmur heard. Pulmonary:     Effort: Pulmonary effort is normal. No respiratory distress.     Breath sounds: No wheezing, rhonchi or rales.  Neurological:     Mental Status: She is alert.  Psychiatric:        Mood and Affect: Mood normal.        Behavior: Behavior normal.    Disposition: Status is: Inpatient Remains inpatient appropriate because: hypoxia    IV/PRN Medications:  Oxycodone  10mg  IR tab q4H PRN x3  Discharge Planning: Per Dr. Jadine, plan is for home today. 02 concentrator has been ordered for home delivery.  Family contact: Niece Regina [name]  [phone]. Explained to her that her Wylie will need 02 at home and it will need to be delivered prior to her dc from the [name]. She also stated that she has been working with the [employer] to try to find placement for her Aunt.   IDT: updated  Goals of Care: DNR Limited   Should patient need ambulance transport at discharge, please call [facility] as we contract with them for our active hospice patients.   Please call with any hospice questions or concerns.  Regina [name] BSN, [employer] [phone]

## 2024-04-23 NOTE — Discharge Summary (Addendum)
 Physician Discharge Summary   Patient: Regina [name] MRN: [id number] DOB: 06-22-1966  Admit date:     04/20/2024  Discharge date: 04/23/24  Discharge Physician: Toribio Door   PCP: Celestia Rosaline SQUIBB, NP   Recommendations at discharge:   Limited 2D echocardiogram pending New start on home oxygen 3-4 liters  Discharge Diagnoses: Principal Problem:   Acute respiratory failure with hypoxia (HCC) Active Problems:   HTN (hypertension)   COPD (chronic obstructive pulmonary disease) (HCC)   Lung cancer metastatic to brain (HCC)   Cancer associated pain   History of seizure  Resolved Problems:   * No resolved hospital problems. 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  Admitted for acute hypoxic respiratory failure.  CT chest was negative for PE.  Infectious workup negative.  Stable on 3 to 4 L nasal cannula but hypoxic cough.  Echocardiogram last month was reassuring.  Given the elevated BNP and troponin, limited echo was obtained but results are not present.  However the patient is in hospice and management will not change likely based on echo results.  More for information to guide prognosis.  Will follow-up after discharge for any further recommendations.  Hypoxia secondary to underlying disease including lung cancer, COPD.  Consultants None   Procedures/Events None   Acute hypoxic respiratory failure Emphysema COPD Metastatic lung cancer Presented with shortness of breath and hypoxia, required supplemental oxygen.  CT chest negative for PE.   Elevated BNP and troponins noted. COVID/influenza/RSV negative Asymptomatic but remains definitely hypoxic off oxygen. Home with home oxygen.  Hospice involved.   Demand ischemia  Chronic HFpEF EF 70-75% by TTE 03/29/2024.  EKG nonacute.  BNP elevated, small right  pleural effusion seen on imaging.  Otherwise appears euvolemic.   Continue Lopressor  Presenting with chest pain, head somewhat elevated troponins which are new and marked BNP elevation which is new.  Some concern for possible cardiac event in retrospect.  Limited echo is pending.  Given the patient is in hospice, will not likely change management but may provide some information regarding prognosis.  Will follow-up.   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  CT head showed significant improvement in vasogenic edema.  Continue symptom management.  Patient confirms no aggressive management.  DNR/DNI in place. Continue Decadron  4 mg daily Continue home pain regimen   Acute normocytic anemia Baseline around 13-14.  14 late July.  At time of admission 10.5, 9.2 yesterday.  9.5 today.  Stable. No evidence of bleeding.   COPD Emphysema No significant wheezing noted.  Continue DuoNebs as needed.   Seizures Continue Keppra .   History of SVT Continue Lopressor .   Hypertension Continue Lopressor .     Chronic pain Continue home OxyContin  12 mg every 12 hours and oxycodone  10 mg every 4 hours as needed.  Hypokalemia Repleted    Aortic atherosclerosis  Disposition: Home with hospice Diet recommendation:  Regular diet DISCHARGE MEDICATION: Allergies as of 04/23/2024       Reactions   Pork Allergy Nausea And Vomiting, Nausea Only   Bee Venom Hives        Medication List     TAKE these medications    bisacodyl  5 MG EC tablet Generic drug: bisacodyl  Take 1 tablet (5 mg total) by mouth daily as needed for moderate constipation.  dexamethasone  2 MG tablet Commonly known as: DECADRON  Take 2 tablets (4 mg total) by mouth daily.   furosemide  20 MG tablet Commonly known as: Lasix  Take 1 tablet (20 mg total) by mouth daily as needed for fluid or edema.   levETIRAcetam  500 MG tablet Commonly known as:  KEPPRA  Take 2 tablets (1,000 mg total) by mouth 2 (two) times daily.   losartan  25 MG tablet Commonly known as: COZAAR  Take 1 tablet (25 mg total) by mouth daily.   metoprolol  tartrate 50 MG tablet Commonly known as: LOPRESSOR  Take 1 tablet (50 mg total) by mouth 2 (two) times daily.   ondansetron  4 MG tablet Commonly known as: ZOFRAN  Take 1 tablet (4 mg total) by mouth every 6 (six) hours as needed for nausea or vomiting.   oxyCODONE  10 mg 12 hr tablet Commonly known as: OXYCONTIN  Take 1 tablet (10 mg total) by mouth every 12 (twelve) hours.   Oxycodone  HCl 10 MG Tabs Take 1 tablet (10 mg total) by mouth every 4 (four) hours as needed (severe pain).   polyethylene glycol powder 17 GM/SCOOP powder Commonly known as: GLYCOLAX /MIRALAX  Mix 17 g and take by mouth daily.   Senna-S 8.6-50 MG tablet Generic drug: senna-docusate Take 2 tablets by mouth at bedtime.   sertraline  50 MG tablet Commonly known as: ZOLOFT  Take 1 tablet (50 mg total) by mouth daily.               Durable Medical Equipment  (From admission, onward)           Start     Ordered   04/22/24 1743  For home use only DME oxygen  Once       Question Answer Comment  Length of Need Lifetime   Mode or (Route) Nasal cannula   Liters per Minute 2   Frequency Continuous (stationary and portable oxygen unit needed)   Oxygen delivery system Gas      04/22/24 1742            Follow-up Information     Celestia Rosaline SQUIBB, NP. Schedule an appointment as soon as possible for a visit in 1 week(s).   Specialty: Internal Medicine Contact information: [id number] [place] [address] [phone]                Feels ok, getting up to bathroom ok, breathing ok  Discharge Exam: Filed Weights   04/21/24 0439 04/22/24 0439 04/23/24 0451  Weight: 49.1 kg 49.8 kg 50 kg   Physical Exam Vitals reviewed.  Constitutional:      General: She is not in acute distress.    Appearance:  She is not ill-appearing or toxic-appearing.  Cardiovascular:     Rate and Rhythm: Normal rate and regular rhythm.     Heart sounds: No murmur heard. Pulmonary:     Effort: Pulmonary effort is normal. No respiratory distress.     Breath sounds: No wheezing, rhonchi or rales.  Neurological:     Mental Status: She is alert.  Psychiatric:        Mood and Affect: Mood normal.        Behavior: Behavior normal.      Condition at discharge: good  The results of significant diagnostics from this hospitalization (including imaging, microbiology, ancillary and laboratory) are listed below for reference.   Imaging Studies: CT Angio Chest PE W and/or Wo Contrast Result Date: 04/20/2024 CLINICAL DATA:  Pulmonary embolism (PE) suspected, high prob. Chest pain, shortness of  breath EXAM: CT ANGIOGRAPHY CHEST WITH CONTRAST TECHNIQUE: Multidetector CT imaging of the chest was performed using the standard protocol during bolus administration of intravenous contrast. Multiplanar CT image reconstructions and MIPs were obtained to evaluate the vascular anatomy. RADIATION DOSE REDUCTION: This exam was performed according to the departmental dose-optimization program which includes automated exposure control, adjustment of the mA and/or kV according to patient size and/or use of iterative reconstruction technique. CONTRAST:  75mL OMNIPAQUE  IOHEXOL  350 MG/ML SOLN COMPARISON:  CT 03/28/2024 FINDINGS: Cardiovascular: Cardiomegaly. No evidence of aortic aneurysm. No filling defects in the pulmonary arteries to suggest pulmonary emboli. Scattered aortic calcifications. Mediastinum/Nodes: No mediastinal, hilar, or axillary adenopathy. Trachea and esophagus are unremarkable. Thyroid  unremarkable. Lungs/Pleura: Biapical scarring. Centrilobular and paraseptal emphysema. Ground-glass opacities within the lungs could reflect early infectious or inflammatory process or areas of air trapping. Trace right pleural effusion. Upper  Abdomen: No acute findings Musculoskeletal: Chest wall soft tissues are unremarkable. No acute bony abnormality. Review of the MIP images confirms the above findings. IMPRESSION: No evidence of pulmonary embolus. Cardiomegaly. Trace right pleural effusion. Patchy ground-glass opacities within the lungs could reflect infectious/inflammatory process or air trapping. Aortic Atherosclerosis (ICD10-I70.0) and Emphysema (ICD10-J43.9). Electronically Signed   By: Franky Crease M.D.   On: 04/20/2024 22:53   CT Head Wo Contrast Result Date: 04/20/2024 EXAM: CT HEAD WITHOUT CONTRAST 04/20/2024 09:40:59 PM TECHNIQUE: CT of the head was performed without the administration of intravenous contrast. Automated exposure control, iterative reconstruction, and/or weight based adjustment of the mA/kV was utilized to reduce the radiation dose to as low as reasonably achievable. COMPARISON: 03/28/2024 CLINICAL HISTORY: Headache, increasing frequency or severity. Patient c/o chest pain and SOB x 1 day. Patient report taking PRN medication without relief. Patient was 80-85% on RA at triage, [facility] started. Patient report nausea denies vomiting. Patient denies fever at home. Hx Lung Ca with mets to brain. FINDINGS: BRAIN AND VENTRICLES: No acute hemorrhage. Gray-white differentiation is preserved. No hydrocephalus. No extra-axial collection. No mass effect or midline shift. Markedly improved vasogenic edema in the anterior left hemisphere. Unchanged appearance of 14 mm suspected lesion in the left frontal white matter. Chronic ischemic white matter changes and mild volume loss. ORBITS: No acute abnormality. SINUSES: No acute abnormality. SOFT TISSUES AND SKULL: No acute soft tissue abnormality. Left frontal craniotomy and right frontal craniectomy. IMPRESSION: 1. Markedly improved vasogenic edema in the anterior left hemisphere. 2. Unchanged appearance of 14 mm suspected lesion in the left frontal white matter. 3. Chronic ischemic white  matter changes and mild volume loss. Electronically signed by: Franky Stanford MD 04/20/2024 10:21 PM EDT RP Workstation: [id number]   DG Chest Port 1 View Result Date: 04/20/2024 CLINICAL DATA:  Chest pain and shortness of breath x1 day. EXAM: PORTABLE CHEST 1 VIEW COMPARISON:  March 28, 2024 FINDINGS: The heart size and mediastinal contours are within normal limits. Mild scarring, atelectasis and fibrotic changes are seen within the bilateral lung bases. Mild biapical scarring, atelectasis and pleural thickening is also seen. No pleural effusion or pneumothorax is identified. Prior ORIF of a mid right humeral fracture is seen. IMPRESSION: Mild bibasilar scarring, atelectasis and fibrotic changes. Electronically Signed   By: Suzen Dials M.D.   On: 04/20/2024 19:29   DG Foot 2 Views Left Result Date: 04/02/2024 CLINICAL DATA:  144615 Pain 144615 EXAM: LEFT FOOT - 2 VIEW COMPARISON:  None Available. FINDINGS: Osteopenia.No acute fracture or dislocation. There is no evidence of arthropathy or other focal bone abnormality. Soft tissues  are unremarkable. No radiopaque foreign body. IMPRESSION: No acute fracture or dislocation. Electronically Signed   By: Rogelia Myers M.D.   On: 04/02/2024 18:46   DG Ankle 2 Views Left Result Date: 04/01/2024 CLINICAL DATA:  Left ankle pain. EXAM: LEFT ANKLE - 2 VIEW COMPARISON:  Left foot radiograph dated 03/12/2024. FINDINGS: No acute fracture or dislocation. The bones are osteopenic. The ankle mortise is intact. The soft tissues are unremarkable. IMPRESSION: 1. No acute fracture or dislocation. 2. Osteopenia. Electronically Signed   By: Vanetta Chou M.D.   On: 04/01/2024 15:50   ECHOCARDIOGRAM COMPLETE Result Date: 03/29/2024    ECHOCARDIOGRAM REPORT   Patient Name:   Regina [name] Date of Exam: 03/29/2024 Medical Rec #:  [id number]      Height:       66.0 in Accession #:    [id number]     Weight:       121.3 lb Date of Birth:  May 03, 1966      BSA:          1.617 m  Patient Age:    58 years       BP:           159/97 mmHg Patient Gender: F              HR:           106 bpm. Exam Location:  Inpatient Procedure: 2D Echo, Color Doppler and Cardiac Doppler (Both Spectral and Color            Flow Doppler were utilized during procedure). Indications:    CHF  History:        Patient has no prior history of Echocardiogram examinations.  Sonographer:    Benard Stallion Referring Phys: [phone] PREETHA JOSEPH IMPRESSIONS  1. Mild intercavitary gradient. Left ventricular ejection fraction, by estimation, is 70 to 75%. The left ventricle has hyperdynamic function. The left ventricle has no regional wall motion abnormalities. There is mild concentric left ventricular hypertrophy. Left ventricular diastolic parameters are indeterminate.  2. Right ventricular systolic function is mildly reduced. The right ventricular size is normal.  3. The mitral valve is normal in structure. No evidence of mitral valve regurgitation. No evidence of mitral stenosis.  4. The aortic valve is normal in structure. Aortic valve regurgitation is not visualized. No aortic stenosis is present. FINDINGS  Left Ventricle: Mild intercavitary gradient. Left ventricular ejection fraction, by estimation, is 70 to 75%. The left ventricle has hyperdynamic function. The left ventricle has no regional wall motion abnormalities. The left ventricular internal cavity size was normal in size. There is mild concentric left ventricular hypertrophy. Left ventricular diastolic parameters are indeterminate. Right Ventricle: The right ventricular size is normal. No increase in right ventricular wall thickness. Right ventricular systolic function is mildly reduced. Left Atrium: Left atrial size was normal in size. Right Atrium: Right atrial size was normal in size. Pericardium: There is no evidence of pericardial effusion. Mitral Valve: The mitral valve is normal in structure. No evidence of mitral valve regurgitation. No evidence of  mitral valve stenosis. Tricuspid Valve: The tricuspid valve is normal in structure. Tricuspid valve regurgitation is mild . No evidence of tricuspid stenosis. Aortic Valve: The aortic valve is normal in structure. Aortic valve regurgitation is not visualized. No aortic stenosis is present. Aortic valve mean gradient measures 3.0 mmHg. Aortic valve peak gradient measures 5.6 mmHg. Aortic valve area, by VTI measures 2.30 cm. Pulmonic Valve: The pulmonic valve was normal in structure.  Pulmonic valve regurgitation is not visualized. No evidence of pulmonic stenosis. Aorta: The aortic root is normal in size and structure. Venous: The inferior vena cava was not well visualized. IAS/Shunts: The interatrial septum was not assessed.  LEFT VENTRICLE PLAX 2D LVIDd:         2.60 cm   Diastology LVIDs:         1.50 cm   LV e' medial:    3.59 cm/s LV PW:         1.10 cm   LV E/e' medial:  19.4 LV IVS:        1.20 cm   LV e' lateral:   8.59 cm/s LVOT diam:     1.90 cm   LV E/e' lateral: 8.1 LV SV:         39 LV SV Index:   24 LVOT Area:     2.84 cm  RIGHT VENTRICLE RV Basal diam:  2.80 cm RV Mid diam:    2.70 cm RV S prime:     5.98 cm/s TAPSE (M-mode): 1.9 cm LEFT ATRIUM           Index        RIGHT ATRIUM          Index LA diam:      1.70 cm 1.05 cm/m   RA Area:     8.49 cm LA Vol (A2C): 31.0 ml 19.18 ml/m  RA Volume:   15.90 ml 9.84 ml/m LA Vol (A4C): 13.0 ml 8.04 ml/m  AORTIC VALVE AV Area (Vmax):    2.39 cm AV Area (Vmean):   2.28 cm AV Area (VTI):     2.30 cm AV Vmax:           118.00 cm/s AV Vmean:          80.100 cm/s AV VTI:            0.172 m AV Peak Grad:      5.6 mmHg AV Mean Grad:      3.0 mmHg LVOT Vmax:         99.40 cm/s LVOT Vmean:        64.300 cm/s LVOT VTI:          0.139 m LVOT/AV VTI ratio: 0.81  AORTA Ao Root diam: 2.50 cm Ao Asc diam:  3.00 cm MITRAL VALVE               TRICUSPID VALVE MV Area (PHT): 5.66 cm    TR Peak grad:   41.7 mmHg MV Decel Time: 134 msec    TR Vmax:        323.00 cm/s MV E  velocity: 69.70 cm/s MV A velocity: 91.30 cm/s  SHUNTS MV E/A ratio:  0.76        Systemic VTI:  0.14 m                            Systemic Diam: 1.90 cm Morene Brownie Electronically signed by Morene Brownie Signature Date/Time: 03/29/2024/12:45:53 PM    Final    DG Chest Portable 1 View Result Date: 03/28/2024 CLINICAL DATA:  Chest pain EXAM: PORTABLE CHEST 1 VIEW COMPARISON:  Chest x-ray performed March 12, 2024 FINDINGS: Numerous leads overlie the patient. Mild interstitial prominence, similar. No significant pleural effusion or pneumothorax. IMPRESSION: 1. Chronic appearing interstitial changes. 2. No pleural effusion or pneumothorax. Electronically Signed   By: Maude Naegeli M.D.   On:  03/28/2024 16:01   CT Angio Chest PE W and/or Wo Contrast Result Date: 03/28/2024 CLINICAL DATA:  Concern for pulmonary edema.  Also sepsis. EXAM: CT ANGIOGRAPHY CHEST CT ABDOMEN AND PELVIS WITH CONTRAST TECHNIQUE: Multidetector CT imaging of the chest was performed using the standard protocol during bolus administration of intravenous contrast. Multiplanar CT image reconstructions and MIPs were obtained to evaluate the vascular anatomy. Multidetector CT imaging of the abdomen and pelvis was performed using the standard protocol during bolus administration of intravenous contrast. RADIATION DOSE REDUCTION: This exam was performed according to the departmental dose-optimization program which includes automated exposure control, adjustment of the mA and/or kV according to patient size and/or use of iterative reconstruction technique. CONTRAST:  60mL OMNIPAQUE  IOHEXOL  350 MG/ML SOLN COMPARISON:  Chest radiograph dated 03/28/2024 and CT dated 03/23/2024. FINDINGS: Evaluation of this exam is limited due to respiratory motion and streak artifact caused by patient's arms. CTA CHEST FINDINGS Cardiovascular: There is no cardiomegaly or pericardial effusion. Mild atherosclerotic calcification of the thoracic aorta. No aneurysmal  dilatation or dissection. No pulmonary artery embolus identified. Mediastinum/Nodes: There is bilateral hilar and mediastinal adenopathy measuring 12 mm in short axis on the right. Subcarinal lymph node measures approximately 2.2 cm. Overall similar or slightly improved adenopathy compared to prior CT. The esophagus is grossly unremarkable. No mediastinal fluid collection. Lungs/Pleura: Background of emphysema on biapical subpleural scarring. Interval decrease in the size of the right apical subpleural nodule. A 1.8 cm subpleural bandlike density in the right apex (22/9) likely residual infiltrate or scarring. Continued follow-up recommended. No new nodule. No consolidative changes. There is no pleural effusion or pneumothorax. The central airways are patent. Musculoskeletal: No acute osseous pathology. Right humeral intramedullary nail. Review of the MIP images confirms the above findings. CT ABDOMEN and PELVIS FINDINGS No intra-abdominal free air or free fluid. Hepatobiliary: The liver is unremarkable. No biliary dilatation. The gallbladder is unremarkable Pancreas: Unremarkable. No pancreatic ductal dilatation or surrounding inflammatory changes. Spleen: Normal in size without focal abnormality. Adrenals/Urinary Tract: The adrenal glands unremarkable. There is no hydronephrosis on either side. There is symmetric enhancement and excretion of contrast by both kidneys. The visualized ureters and urinary bladder appear unremarkable. Stomach/Bowel: There is no bowel obstruction. Mild diffuse thickened appearance of the colon may be related to underdistention or represent colitis. Clinical correlation is recommended. Several small scattered colonic diverticula. The appendix is normal. Vascular/Lymphatic: Mild aortoiliac atherosclerotic disease. The IVC is unremarkable no portal venous gas. There is no adenopathy. Reproductive: Several calcified uterine fibroids. No suspicious adnexal masses. Other: None  Musculoskeletal: Osteopenia with degenerative changes. L5 compression fracture with approximately 40-50% loss of vertebral body height, likely acute or subacute. Clinical correlation recommended. Review of the MIP images confirms the above findings. IMPRESSION: 1. No CT evidence of pulmonary artery embolus. 2. Interval decrease in the size of the right apical subpleural nodule. A 1.8 cm subpleural bandlike density in the right apex likely residual infiltrate or scarring. Continued follow-up recommended. 3. Distension of the colon versus colitis.  No bowel obstruction. 4. Mild colonic diverticulosis. 5. L5 compression fracture, likely acute or subacute. 6. Aortic Atherosclerosis (ICD10-I70.0) and Emphysema (ICD10-J43.9). Electronically Signed   By: Vanetta Chou M.D.   On: 03/28/2024 16:00   CT ABDOMEN PELVIS W CONTRAST Result Date: 03/28/2024 CLINICAL DATA:  Concern for pulmonary edema.  Also sepsis. EXAM: CT ANGIOGRAPHY CHEST CT ABDOMEN AND PELVIS WITH CONTRAST TECHNIQUE: Multidetector CT imaging of the chest was performed using the standard protocol during bolus administration  of intravenous contrast. Multiplanar CT image reconstructions and MIPs were obtained to evaluate the vascular anatomy. Multidetector CT imaging of the abdomen and pelvis was performed using the standard protocol during bolus administration of intravenous contrast. RADIATION DOSE REDUCTION: This exam was performed according to the departmental dose-optimization program which includes automated exposure control, adjustment of the mA and/or kV according to patient size and/or use of iterative reconstruction technique. CONTRAST:  60mL OMNIPAQUE  IOHEXOL  350 MG/ML SOLN COMPARISON:  Chest radiograph dated 03/28/2024 and CT dated 03/23/2024. FINDINGS: Evaluation of this exam is limited due to respiratory motion and streak artifact caused by patient's arms. CTA CHEST FINDINGS Cardiovascular: There is no cardiomegaly or pericardial effusion.  Mild atherosclerotic calcification of the thoracic aorta. No aneurysmal dilatation or dissection. No pulmonary artery embolus identified. Mediastinum/Nodes: There is bilateral hilar and mediastinal adenopathy measuring 12 mm in short axis on the right. Subcarinal lymph node measures approximately 2.2 cm. Overall similar or slightly improved adenopathy compared to prior CT. The esophagus is grossly unremarkable. No mediastinal fluid collection. Lungs/Pleura: Background of emphysema on biapical subpleural scarring. Interval decrease in the size of the right apical subpleural nodule. A 1.8 cm subpleural bandlike density in the right apex (22/9) likely residual infiltrate or scarring. Continued follow-up recommended. No new nodule. No consolidative changes. There is no pleural effusion or pneumothorax. The central airways are patent. Musculoskeletal: No acute osseous pathology. Right humeral intramedullary nail. Review of the MIP images confirms the above findings. CT ABDOMEN and PELVIS FINDINGS No intra-abdominal free air or free fluid. Hepatobiliary: The liver is unremarkable. No biliary dilatation. The gallbladder is unremarkable Pancreas: Unremarkable. No pancreatic ductal dilatation or surrounding inflammatory changes. Spleen: Normal in size without focal abnormality. Adrenals/Urinary Tract: The adrenal glands unremarkable. There is no hydronephrosis on either side. There is symmetric enhancement and excretion of contrast by both kidneys. The visualized ureters and urinary bladder appear unremarkable. Stomach/Bowel: There is no bowel obstruction. Mild diffuse thickened appearance of the colon may be related to underdistention or represent colitis. Clinical correlation is recommended. Several small scattered colonic diverticula. The appendix is normal. Vascular/Lymphatic: Mild aortoiliac atherosclerotic disease. The IVC is unremarkable no portal venous gas. There is no adenopathy. Reproductive: Several calcified  uterine fibroids. No suspicious adnexal masses. Other: None Musculoskeletal: Osteopenia with degenerative changes. L5 compression fracture with approximately 40-50% loss of vertebral body height, likely acute or subacute. Clinical correlation recommended. Review of the MIP images confirms the above findings. IMPRESSION: 1. No CT evidence of pulmonary artery embolus. 2. Interval decrease in the size of the right apical subpleural nodule. A 1.8 cm subpleural bandlike density in the right apex likely residual infiltrate or scarring. Continued follow-up recommended. 3. Distension of the colon versus colitis.  No bowel obstruction. 4. Mild colonic diverticulosis. 5. L5 compression fracture, likely acute or subacute. 6. Aortic Atherosclerosis (ICD10-I70.0) and Emphysema (ICD10-J43.9). Electronically Signed   By: Vanetta Chou M.D.   On: 03/28/2024 16:00   CT Head Wo Contrast Result Date: 03/28/2024 CLINICAL DATA:  Neuro deficit, acute, stroke suspected EXAM: CT HEAD WITHOUT CONTRAST TECHNIQUE: Contiguous axial images were obtained from the base of the skull through the vertex without intravenous contrast. RADIATION DOSE REDUCTION: This exam was performed according to the departmental dose-optimization program which includes automated exposure control, adjustment of the mA and/or kV according to patient size and/or use of iterative reconstruction technique. COMPARISON:  CT head 03/19/2024, MRI head 02/22/2024 FINDINGS: Brain: Patchy and confluent areas of decreased attenuation are noted throughout the deep and  periventricular white matter of the cerebral hemispheres bilaterally, compatible with chronic microvascular ischemic disease. No evidence of large-territorial acute infarction. No parenchymal hemorrhage. Redemonstration of a left frontal lobe 2.5 cm mass with associated marked vasogenic edema leading to grossly stable 4 mm left-to-right midline shift. No extra-axial collection. No hydrocephalus. Basilar  cisterns are patent. Vascular: No hyperdense vessel. Skull: No acute fracture or focal lesion.  Prior left craniotomy. Sinuses/Orbits: Paranasal sinuses and mastoid air cells are clear. The orbits are unremarkable. Other: None. 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 Electronically Signed   By: Morgane  Naveau M.D.   On: 03/28/2024 15:53    Microbiology: Results for orders placed or performed during the hospital encounter of 04/20/24  Resp panel by RT-PCR (RSV, Flu A&B, Covid) Anterior Nasal Swab     Status: None   Collection Time: 04/20/24 11:58 PM   Specimen: Anterior Nasal Swab  Result Value Ref Range Status   SARS Coronavirus 2 by RT PCR NEGATIVE NEGATIVE Final    Comment: (NOTE) SARS-CoV-2 target nucleic acids are NOT DETECTED.  The SARS-CoV-2 RNA is generally detectable in upper respiratory specimens during the acute phase of infection. The lowest concentration of SARS-CoV-2 viral copies this assay can detect is 138 copies/mL. A negative result does not preclude SARS-Cov-2 infection and should not be used as the sole basis for treatment or other patient management decisions. A negative result may occur with  improper specimen collection/handling, submission of specimen other than nasopharyngeal swab, presence of viral mutation(s) within the areas targeted by this assay, and inadequate number of viral copies(<138 copies/mL). A negative result must be combined with clinical observations, patient history, and epidemiological information. The expected result is Negative.  Fact Sheet for Patients:  BloggerCourse.com  Fact Sheet for Healthcare Providers:  SeriousBroker.it  This test is no t yet approved or cleared by the [place]  FDA and  has been authorized for detection and/or diagnosis of SARS-CoV-2 by FDA under an Emergency  Use Authorization (EUA). This EUA will remain  in effect (meaning this test can be used) for the duration of the COVID-19 declaration under Section 564(b)(1) of the Act, 21 U.S.C.section 360bbb-3(b)(1), unless the authorization is terminated  or revoked sooner.       Influenza A by PCR NEGATIVE NEGATIVE Final   Influenza B by PCR NEGATIVE NEGATIVE Final    Comment: (NOTE) The Xpert Xpress SARS-CoV-2/FLU/RSV plus assay is intended as an aid in the diagnosis of influenza from Nasopharyngeal swab specimens and should not be used as a sole basis for treatment. Nasal washings and aspirates are unacceptable for Xpert Xpress SARS-CoV-2/FLU/RSV testing.  Fact Sheet for Patients: BloggerCourse.com  Fact Sheet for Healthcare Providers: SeriousBroker.it  This test is not yet approved or cleared by the [place]  FDA and has been authorized for detection and/or diagnosis of SARS-CoV-2 by FDA under an Emergency Use Authorization (EUA). This EUA will remain in effect (meaning this test can be used) for the duration of the COVID-19 declaration under Section 564(b)(1) of the Act, 21 U.S.C. section 360bbb-3(b)(1), unless the authorization is terminated or revoked.     Resp Syncytial Virus by PCR NEGATIVE NEGATIVE Final    Comment: (NOTE) Fact Sheet for Patients: BloggerCourse.com  Fact Sheet for Healthcare Providers: SeriousBroker.it  This test is not yet approved or cleared by the [place]  FDA and has been authorized for detection and/or diagnosis of SARS-CoV-2 by FDA under  an Emergency Use Authorization (EUA). This EUA will remain in effect (meaning this test can be used) for the duration of the COVID-19 declaration under Section 564(b)(1) of the Act, 21 U.S.C. section 360bbb-3(b)(1), unless the authorization is terminated or revoked.  Performed at [facility],  [address], [place], [place] [address]     Labs: CBC: Recent Labs  Lab 04/20/24 1940 04/21/24 0535 04/23/24 0445  WBC 11.4* 10.7* 7.8  HGB 10.5* 9.2* 9.5*  HCT 33.3* 28.8* 29.3*  MCV 88.6 89.4 91.0  PLT 507* 450* 545*   Basic Metabolic Panel: Recent Labs  Lab 04/20/24 1940 04/21/24 0535  NA 133* 133*  K 3.9 3.2*  CL 100 101  CO2 21* 22  GLUCOSE 99 102*  BUN 15 12  CREATININE 0.64 0.62  CALCIUM  9.0 8.4*   Liver Function Tests: No results for input(s): AST, ALT, ALKPHOS, BILITOT, PROT, ALBUMIN in the last 168 hours. CBG: No results for input(s): GLUCAP in the last 168 hours.  Discharge time spent: greater than 30 minutes.  Signed: Toribio Door, MD [facility] 04/23/2024

## 2024-04-25 ENCOUNTER — Other Ambulatory Visit (HOSPITAL_COMMUNITY): Payer: Self-pay

## 2024-04-26 ENCOUNTER — Other Ambulatory Visit (HOSPITAL_COMMUNITY): Payer: Self-pay

## 2024-05-03 ENCOUNTER — Encounter: Payer: Self-pay | Admitting: Internal Medicine

## 2024-05-28 ENCOUNTER — Encounter: Payer: Self-pay | Admitting: Internal Medicine

## 2024-07-09 ENCOUNTER — Encounter: Payer: Self-pay | Admitting: Radiology

## 2024-07-13 ENCOUNTER — Emergency Department (HOSPITAL_COMMUNITY)
Admission: EM | Admit: 2024-07-13 | Discharge: 2024-07-13 | Disposition: A | Discharge status: Skilled Nursing Facility | Payer: MEDICAID | Attending: Emergency Medicine | Admitting: Emergency Medicine

## 2024-07-13 ENCOUNTER — Emergency Department (HOSPITAL_COMMUNITY): Payer: MEDICAID

## 2024-07-13 ENCOUNTER — Other Ambulatory Visit: Payer: Self-pay

## 2024-07-13 DIAGNOSIS — R52 Pain, unspecified: Secondary | ICD-10-CM

## 2024-07-13 DIAGNOSIS — M791 Myalgia, unspecified site: Secondary | ICD-10-CM | POA: Insufficient documentation

## 2024-07-13 DIAGNOSIS — Z85118 Personal history of other malignant neoplasm of bronchus and lung: Secondary | ICD-10-CM | POA: Diagnosis not present

## 2024-07-13 DIAGNOSIS — R000 Tachycardia, unspecified: Secondary | ICD-10-CM | POA: Diagnosis not present

## 2024-07-13 DIAGNOSIS — R0603 Acute respiratory distress: Secondary | ICD-10-CM | POA: Diagnosis not present

## 2024-07-13 DIAGNOSIS — R600 Localized edema: Secondary | ICD-10-CM | POA: Diagnosis not present

## 2024-07-13 DIAGNOSIS — R5383 Other fatigue: Secondary | ICD-10-CM | POA: Insufficient documentation

## 2024-07-13 DIAGNOSIS — R0689 Other abnormalities of breathing: Secondary | ICD-10-CM | POA: Diagnosis present

## 2024-07-13 LAB — COMPREHENSIVE METABOLIC PANEL WITH GFR
ALT: 14 U/L (ref 0–44)
AST: 37 U/L (ref 15–41)
Albumin: 4 g/dL (ref 3.5–5.0)
Alkaline Phosphatase: 104 U/L (ref 38–126)
Anion gap: 17 — ABNORMAL HIGH (ref 5–15)
BUN: 8 mg/dL (ref 6–20)
CO2: 25 mmol/L (ref 22–32)
Calcium: 9.4 mg/dL (ref 8.9–10.3)
Chloride: 94 mmol/L — ABNORMAL LOW (ref 98–111)
Creatinine, Ser: 0.67 mg/dL (ref 0.44–1.00)
GFR, Estimated: >60 mL/min (ref >60)
Glucose, Bld: 145 mg/dL — ABNORMAL HIGH (ref 70–99)
Potassium: 3 mmol/L — ABNORMAL LOW (ref 3.5–5.1)
Sodium: 136 mmol/L (ref 135–145)
Total Bilirubin: 0.4 mg/dL (ref 0.0–1.2)
Total Protein: 7.3 g/dL (ref 6.5–8.1)

## 2024-07-13 LAB — CBC WITH DIFFERENTIAL/PLATELET
Abs Immature Granulocytes: 0.1 K/uL — ABNORMAL HIGH (ref 0.00–0.07)
Basophils Absolute: 0 K/uL (ref 0.0–0.1)
Basophils Relative: 0 %
Eosinophils Absolute: 0 K/uL (ref 0.0–0.5)
Eosinophils Relative: 0 %
HCT: 45.4 % (ref 36.0–46.0)
Hemoglobin: 14.9 g/dL (ref 12.0–15.0)
Immature Granulocytes: 1 %
Lymphocytes Relative: 17 %
Lymphs Abs: 2.1 K/uL (ref 0.7–4.0)
MCH: 27.2 pg (ref 26.0–34.0)
MCHC: 32.8 g/dL (ref 30.0–36.0)
MCV: 83 fL (ref 80.0–100.0)
Monocytes Absolute: 0.9 K/uL (ref 0.1–1.0)
Monocytes Relative: 7 %
Neutro Abs: 8.8 K/uL — ABNORMAL HIGH (ref 1.7–7.7)
Neutrophils Relative %: 75 %
Platelets: 500 K/uL — ABNORMAL HIGH (ref 150–400)
RBC: 5.47 MIL/uL — ABNORMAL HIGH (ref 3.87–5.11)
RDW: 16.1 % — ABNORMAL HIGH (ref 11.5–15.5)
WBC: 11.8 K/uL — ABNORMAL HIGH (ref 4.0–10.5)
nRBC: 0 % (ref 0.0–0.2)

## 2024-07-13 LAB — TROPONIN T, HIGH SENSITIVITY: Troponin T High Sensitivity: 17 ng/L (ref 0–19)

## 2024-07-13 MED ORDER — METOPROLOL TARTRATE 25 MG PO TABS
50.0000 mg | ORAL_TABLET | Freq: Two times a day (BID) | ORAL | Status: DC
  Administered 2024-07-13: 50 mg via ORAL
  Filled 2024-07-13: qty 2

## 2024-07-13 MED ORDER — LOSARTAN POTASSIUM 25 MG PO TABS
25.0000 mg | ORAL_TABLET | Freq: Every day | ORAL | Status: DC
  Administered 2024-07-13: 25 mg via ORAL
  Filled 2024-07-13: qty 1

## 2024-07-13 MED ORDER — MORPHINE SULFATE (PF) 4 MG/ML IV SOLN
4.0000 mg | Freq: Once | INTRAVENOUS | Status: AC
  Administered 2024-07-13: 4 mg via INTRAVENOUS
  Filled 2024-07-13: qty 1

## 2024-07-13 MED ORDER — ALBUTEROL SULFATE (2.5 MG/3ML) 0.083% IN NEBU
5.0000 mg | INHALATION_SOLUTION | Freq: Once | RESPIRATORY_TRACT | Status: AC
  Administered 2024-07-13: 5 mg via RESPIRATORY_TRACT
  Filled 2024-07-13: qty 6

## 2024-07-13 MED ORDER — HYDROMORPHONE HCL 1 MG/ML IJ SOLN
1.0000 mg | Freq: Once | INTRAMUSCULAR | Status: AC
  Administered 2024-07-13: 1 mg via INTRAVENOUS
  Filled 2024-07-13: qty 1

## 2024-07-13 MED ORDER — LEVETIRACETAM 500 MG PO TABS
1000.0000 mg | ORAL_TABLET | Freq: Two times a day (BID) | ORAL | Status: DC
  Administered 2024-07-13: 1000 mg via ORAL
  Filled 2024-07-13: qty 2

## 2024-07-13 NOTE — ED Provider Notes (Signed)
 [facility] EMERGENCY DEPARTMENT AT [facility] Provider Note   CSN: [id number] Arrival date & time: 07/13/24  9395     Patient presents with: No chief complaint on file.   Regina [name] is a 58 y.o. female.   The history is provided by the patient and the EMS personnel.  Regina [name] is a 58 y.o. female who presents to the Emergency Department complaining of pain and difficulty breathing.  She presents to the emergency department by EMS for evaluation of pain all over and trouble breathing since yesterday.  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   Prior to Admission medications   Medication Sig Start Date End Date Taking? Authorizing Provider  bisacodyl  (DULCOLAX) 5 MG EC tablet Take 1 tablet (5 mg total) by mouth daily as needed for moderate constipation. 04/04/24   Ghimire, Donalda HERO, MD  dexamethasone  (DECADRON ) 2 MG tablet Take 2 tablets (4 mg total) by mouth daily. 04/04/24   Ghimire, Donalda HERO, MD  furosemide  (LASIX ) 20 MG tablet Take 1 tablet (20 mg total) by mouth daily as needed for fluid or edema. 04/04/24 04/04/25  Ghimire, Donalda HERO, MD  levETIRAcetam  (KEPPRA ) 500 MG tablet Take 2 tablets (1,000 mg total) by mouth 2 (two) times daily. 04/04/24   Ghimire, Donalda HERO, MD  losartan  (COZAAR ) 25 MG tablet Take 1 tablet (25 mg total) by mouth daily. 04/04/24   Ghimire, Donalda HERO, MD  metoprolol  tartrate (LOPRESSOR ) 50 MG tablet Take 1 tablet (50 mg total) by mouth 2 (two) times daily. 04/04/24   Ghimire, Donalda HERO, MD  ondansetron  (ZOFRAN ) 4 MG tablet Take 1 tablet (4 mg total) by mouth every 6 (six) hours as needed for nausea or vomiting. 04/04/24   Ghimire, Donalda HERO, MD  oxyCODONE  (OXYCONTIN ) 10 mg 12 hr tablet Take 1 tablet (10 mg total) by mouth every 12 (twelve) hours. 04/04/24   Ghimire, Donalda HERO, MD  Oxycodone  HCl 10 MG TABS Take 1 tablet (10 mg total) by mouth every 4 (four) hours  as needed (severe pain). 04/04/24   Ghimire, Donalda HERO, MD  polyethylene glycol powder (GLYCOLAX /MIRALAX ) 17 GM/SCOOP powder Mix 17 g and take by mouth daily. 04/04/24   Ghimire, Donalda HERO, MD  senna-docusate (SENOKOT-S) 8.6-50 MG tablet Take 2 tablets by mouth at bedtime. 04/04/24   Ghimire, Donalda HERO, MD  sertraline  (ZOLOFT ) 50 MG tablet Take 1 tablet (50 mg total) by mouth daily. 04/23/24 05/23/24  Jadine Toribio SQUIBB, MD    Allergies: Pork allergy and Bee venom    Review of Systems  Unable to perform ROS: Mental status change    Updated Vital Signs LMP 06/06/2010   Physical Exam Vitals and nursing note reviewed.  Constitutional:      General: She is in acute distress.     Appearance: She is well-developed. She is ill-appearing.     Comments: Lethargic  HENT:     Head: Normocephalic and atraumatic.  Cardiovascular:     Rate and Rhythm: Regular rhythm. Tachycardia present.     Heart sounds: No murmur heard. Pulmonary:     Effort: Respiratory distress present.     Comments: Rhonchi in right lung fields, decreased air movement in left lung fields. Abdominal:     Palpations: Abdomen is soft.     Tenderness: There is no abdominal tenderness. There is no guarding or rebound.  Musculoskeletal:  General: No tenderness.     Comments: 2-3+ pitting edema to the left lower extremity  Skin:    General: Skin is warm and dry.  Neurological:     Comments: Lethargic, awakens to verbal stimuli.  Moves all extremities.  Psychiatric:     Comments: Anxious, mildly agitated     (all labs ordered are listed, but only abnormal results are displayed) Labs Reviewed - No data to display  EKG: None  Radiology: No results found.   Procedures  CRITICAL CARE Performed by: Almarie Burner   Total critical care time: 40 minutes  Critical care time was exclusive of separately billable procedures and treating other patients.  Critical care was necessary to treat or prevent imminent or  life-threatening deterioration.  Critical care was time spent personally by me on the following activities: development of treatment plan with patient and/or surrogate as well as nursing, discussions with consultants, evaluation of patient's response to treatment, examination of patient, obtaining history from patient or surrogate, ordering and performing treatments and interventions, ordering and review of laboratory studies, ordering and review of radiographic studies, pulse oximetry and re-evaluation of patient's condition.  Medications Ordered in the ED - No data to display                                  Medical Decision Making Amount and/or Complexity of Data Reviewed Labs: ordered. Radiology: ordered.  Risk Prescription drug management.   Patient in hospice for metastatic lung cancer here for evaluation of pain all over, difficulty breathing.  Patient and extremis at time of ED arrival.  She did receive ketamine, fentanyl  by EMS without significant improvement.  She was treated with morphine  for additional pain control with no significant improvement.  Will try hydromorphone .  Discussed with hospice liaison regarding patient's treatment plan.  Also discussed with patient's niece over the phone patient's critical nature.  Niece states that patient was doing relatively well over the weekend, able to ambulate in the home.  Discussed significant worsening.  On record review patient did not want aggressive measures.  Will focus on symptom management.  Patient care transferred pending ongoing symptom management as well as hospice assistance.     Final diagnoses:  None    ED Discharge Orders     None          Burner Almarie, MD 07/13/24 0740

## 2024-07-13 NOTE — ED Triage Notes (Signed)
 Pt BIBA from home for pain all over. Hx cancer lungs, bone, brain. Pain started last night, worse this morning. Wheezes upper fields, given albuterol  en route. Denies shob. 100mcg fentanyl  with no relief, then 11.8mg  ketamine with some relief but more calm. 3L [place] baseline. 24g lf.  180/100 Hr 60s Rr 40s

## 2024-07-13 NOTE — ED Provider Notes (Signed)
 Care of the patient assumed at signout.  On my initial evaluation she is receiving nebulizer therapy, subsequently is awake, we discussed presentation, and with niece at bedside hospice enrollment.  12:13 PM I have confirmed patient's disposition to [facility], hospice.  We confirmed DNR status, transportation and route.   Garrick Charleston, MD 07/13/24 1213

## 2024-07-13 NOTE — Progress Notes (Signed)
 [name] ED [phone] [facility] liaison note     This patient is a current hospice patient with [facility]. Please see media tab for detailed hospice report.    Liaison will continue to follow for any discharge planning needs and to coordinate continuation of hospice care.    Please don't hesitate to call with any Hospice related questions or concerns.    Thank you for the opportunity to participate in this patient's care.  Greig Basket, BSN, RN [facility] Liaison [phone]

## 2024-07-13 NOTE — Progress Notes (Signed)
 [facility] Liaison Note:  [facility] EMS has been called and they have the patient on the list for transport shortly.  RN please call [facility] at [phone] to provide report. Please leave in IV access and send DNR with ambulance.  Thank you,  Greig Basket, BSN, RN [facility] Liaison  [phone]

## 2024-08-24 NOTE — ED Provider Notes (Addendum)
 [facility] Emergency Department Provider Note    ED Clinical Impression   Final diagnoses:  Altered mental status, unspecified altered mental status type (Primary)    Final ED Assessment/Plan  This is a 58 year old patient who presents with acutely worsening pain from her hospice facility.  Patient is unable to provide history, only to endorse that she is in pain.  Did discuss with several of her caretakers including former hospice facility [facility], current skilled nursing facility [facility], current medical provider Dr. Cheryal Eastern.  She has no listed healthcare power of attorney.  Endorsed a living relative and caretaker, whose number was out of service.  After conversations with social worker at [employer] [employer], publishing copy at [employer], Dr. Eastern clear consensus that patient has a terminal diagnosis and is moving towards hospice care.  She was sent to the hospital this morning for elevated blood pressure and acute agitation, both of which have now resolved.  After collecting collateral with Dr. Eastern, patient is A and O x 1 at baseline in general is confused and reports pain.  She does not have any localizing infectious signs or symptoms, has notably a reassuring CBC, lactate, RPP.  Did not collect UA as she is not having flank pain, fever, hemodynamic instability and in light of her longstanding altered mental status this would not be appropriate without plans to treat (see [facility] UTI AMS algorithm); chest x-ray without acute consolidation CT head without contrast without hemorrhage.  As patient is at baseline and indeed we may worsen her baseline mental status with visit to the ED, will discharge to her skilled nursing facility for treatment of her chronic cancer pain with her established regimen.  After discharge order in, updates as below:   New Prescriptions   No medications on file   ED Course   ED Course as of 08/24/24 1808  Fri Aug 24, 2024  1139 Called [facility] at  [phone], which was listed as patient's previous hospice facility. Spoke with RN, who let us  know that she was transferred to another hospice facility in [facility].   1140 HDS, normotensive. CBC w/diff unremarkable. CMP with minimally elevated glucose.   1141 Spoke with [facility] nurse Grayce, who reports they do not have hospice orders in for patient because the patient's hospice company does not contract with [facility]. She is receiving therapies. She does not have contact information for Dr. Eastern other than his office number. Pt has no listed relatives in chart.   1223 Spoke with Dr. Waldron Eastern the patient's physician at her assisted living facility.  He endorses that her mental baseline is in fact ANO x 1 and that he was not notified of her transfer to the emergency department this morning.  He endorses that her usual complaints of being in pain and being acutely altered are in fact her baseline.  He does note that she was transferred from a hospice facility but he is not exactly sure why gives me the social worker and nurse or provider's contact information.  When talking to the social worker and new supervisor at [employer] their understanding of the situation is that she was transferred from hospice as she was too high functioning at hospice, thus was transferred for acute skilled nursing facility.  Unfortunately they do think she is may be heading towards a hospice state this morning, with her acute agitation and inability of nursing facility to handle her acutely altered status.  They are actively seeking out transfer to hospice level care.  They give me her contact information for her niece, who is not listed as healthcare power of attorney but has been helpful in guiding some of patient's care recently.  Lesley  [phone].   1302 ATC Racia at lsited number but not in service.   1303 CXR w/fibrotic interstitial llung dz.   1324 Lactate nl   1708 While awaiting transport via [facility] but still  under our care patient absconded to the parking lot and fell and hit her head and knee.  1708 Knee x-ray without acute fracture or dislocation.  1708 RPP negative  1722 CT head without interval change from this morning's head CT  1747 On exam, at baseline from this AM. PERRLA, not endorsing head pain, no signs of scalp trauma, abrasion to knee. Safe for DC home and patient endorsing this wish fervently.      History   Chief Complaint  Altered Mental Status   HPI   Please note, this note was created using voice recognition technology.  I have made efforts to catch any phonetic/typographical errors but these may not always be found. Such errors, especially pronoun confusion, do not reflect on the standard of medical care.   Regina [name] is a 58 y.o. female with a past medical history of non-small cell lung cancer metastatic cancer to the brain and bone, supraventricular tachycardia, history of cerebral edema, congestive heart failure, COPD, baseline aphasia, cannabis and cannabis use, hypertension presenting with acutely altered mental status from her healthcare facility.  Upon getting collateral from [facility] for help facility patient was in pain this morning and reporting wanting to go to the hospital, due to this pain.  She was unusually irritable and irritable with her family members.  Due to her reported request to go to the hospital with complaints of pain and systolic blood pressures into the 230s [facility] decided to send her to the emergency department.  At baseline her facility reports that she is oriented x 3. She did not receive her medication this AM.   Available old records in [place] were reviewed for this visit.   Past Medical History[1]  Past Surgical History[2]   Current Facility-Administered Medications:    HYDROmorphone  (DILAUDID ) tablet 2 mg, 2 mg, Oral, Q2H PRN, [place] Estefana Math, MD, 2 mg at 08/24/24 1344   levETIRAcetam  (KEPPRA ) tablet 1,000 mg, 1,000 mg,  Oral, BID, [place] Estefana Math, MD, 1,000 mg at 08/24/24 1344  Current Outpatient Medications:    HYDROmorphone  (DILAUDID ) 2 MG tablet, QID plus Q2H PRN, Disp: 150 tablet, Rfl: 0   methadone (DOLOPHINE) 10 MG tablet, Take 1 tablet (10 mg total) by mouth three (3) times a day., Disp: 90 tablet, Rfl: 0  Allergies Pork/porcine containing products and Venom-honey bee  Family History[3]  Social History Short Social History[4]  Review of Systems Review of Systems  Physical Exam   Vital Signs BP (!) 180/118   Pulse 61   Temp 36 C (96.8 F)   Resp 13   SpO2 95%   Physical Exam Physical Exam Constitutional:      General: She is not in acute distress.    Appearance: She is not toxic-appearing.  HENT:     Head: Normocephalic and atraumatic.     Mouth/Throat:     Mouth: Mucous membranes are moist.  Eyes:     Extraocular Movements: Extraocular movements intact.     Pupils: Pupils are equal, round, and reactive to light.  Cardiovascular:     Rate and Rhythm: Normal rate  and regular rhythm.     Heart sounds: Normal heart sounds.  Pulmonary:     Effort: Pulmonary effort is normal.     Comments: Fine crackles  Abdominal:     General: There is no distension.     Palpations: Abdomen is soft. There is no mass.     Tenderness: There is no abdominal tenderness. There is no guarding.  Musculoskeletal:        General: No swelling.  Skin:    General: Skin is warm and dry.  Neurological:     Mental Status: She is alert. She is disoriented.     Comments: AOX1   Psychiatric:        Behavior: Behavior is agitated.     Pertinent EKG, Labs, and Radiology    Results for orders placed or performed during the hospital encounter of 08/24/24  Respiratory Pathogen Panel with COVID (Nasopharyngeal)  Result Value Ref Range   Adenovirus Not Detected Not Detected   Coronavirus HKU1 Not Detected Not Detected   Coronavirus NL63 Not Detected Not Detected   Coronavirus 229E Not Detected  Not Detected   Coronavirus OC43 PCR Not Detected Not Detected   Metapneumovirus Not Detected Not Detected   Rhinovirus/Enterovirus Not Detected Not Detected   Influenza A Not Detected Not Detected   Influenza B Not Detected Not Detected   Parainfluenza 1 Not Detected Not Detected   Parainfluenza 2 Not Detected Not Detected   Parainfluenza 3 Not Detected Not Detected   Parainfluenza 4 Not Detected Not Detected   RSV Not Detected Not Detected   Bordetella pertussis Not Detected Not Detected   Bordetella parapertussis Not Detected Not Detected   Chlamydophila (Chlamydia) pneumoniae Not Detected Not Detected   Mycoplasma pneumoniae Not Detected Not Detected   SARS-CoV-2 PCR Not Detected Not Detected  Comprehensive Metabolic Panel  Result Value Ref Range   Sodium 138 135 - 145 mmol/L   Potassium 3.4 (L) 3.5 - 5.0 mmol/L   Chloride 102 98 - 107 mmol/L   CO2 23.0 22.0 - 32.0 mmol/L   Anion Gap 13 5 - 14 mmol/L   BUN 16 7 - 21 mg/dL   Creatinine 9.39 9.39 - 1.00 mg/dL   BUN/Creatinine Ratio 27    eGFR CKD-EPI (2021) Female >90 >=60 mL/min/1.23m2   Glucose 109 (H) 74 - 106 mg/dL   Calcium  9.3 8.5 - 10.2 mg/dL   Albumin 4.5 3.4 - 5.0 g/dL   Total Protein 7.0 6.5 - 8.3 g/dL   Total Bilirubin 0.4 0.1 - 1.2 mg/dL   AST 30 14 - 38 U/L   ALT 15 <35 U/L   Alkaline Phosphatase 64 38 - 126 U/L  Lactate Sepsis  Result Value Ref Range   Lactate 1.7 0.7 - 2.0 mmol/L  GREEN LITHIUM HEPARIN  EXTRA TUBE  Result Value Ref Range   Extra Tube Green Lithium Heparin     CBC w/ Differential  Result Value Ref Range   WBC 9.9 3.6 - 11.2 10*9/L   RBC 4.81 3.95 - 5.13 10*12/L   HGB 13.1 11.3 - 14.9 g/dL   HCT 60.0 65.9 - 55.9 %   MCV 82.9 77.6 - 95.7 fL   MCH 27.3 25.9 - 32.4 pg   MCHC 33.0 32.0 - 36.0 g/dL   RDW 81.2 (H) 87.7 - 84.7 %   MPV 7.3 6.8 - 10.7 fL   Platelet 393 150 - 450 10*9/L   nRBC 0 <=4 /100 WBCs   Neutrophils % 59.6 %  Lymphocytes % 30.5 %   Monocytes % 8.6 %   Eosinophils %  0.7 %   Basophils % 0.6 %   Absolute Neutrophils 5.9 1.8 - 7.8 10*9/L   Absolute Lymphocytes 3.0 1.1 - 3.6 10*9/L   Absolute Monocytes 0.8 0.3 - 0.8 10*9/L   Absolute Eosinophils 0.1 0.0 - 0.5 10*9/L   Absolute Basophils 0.1 0.0 - 0.1 10*9/L    I independently reviewed the images in addition to interpreting the radiologist interpretation.   XR Knee 3 Views Left Result Date: 08/24/2024 EXAM: XR KNEE 3 VIEWS LEFT DATE: 08/24/2024 4:35 PM ACCESSION: [id number] [facility] DICTATED: 08/24/2024 4:47 PM INTERPRETATION LOCATION: [facility] CLINICAL INDICATION: 58 years old Female with fall    COMPARISON: None. TECHNIQUE: AP, lateral, and sunrise views of the left knee. AP and sunrise views of the right knee. FINDINGS: Diffuse osteopenia. No acute fracture or dislocation. Tricompartmental spaces are maintained. No joint effusion. Soft tissues are unremarkable.   Diffuse osteopenia. No acute fracture or dislocation.   XR Chest 2 views Result Date: 08/24/2024 EXAM: XR CHEST 2 VIEWS ACCESSION: [id number] [facility] REPORT DATE: 08/24/2024 12:01 PM CLINICAL INDICATION: HYPOXEMIA  TECHNIQUE: PA and Lateral Chest Radiographs COMPARISON: None FINDINGS: Biapical scarring. Bibasilar predominant subpleural reticulations with associated bronchiectasis. No pleural effusion or pneumothorax. Cardiac silhouette is normal in size. Thoracic aorta with calcifications.   Fibrotic interstitial lung disease. Recommend further evaluation high-resolution CT chest.   CT Head Wo Contrast Result Date: 08/24/2024 EXAM: Computed tomography, head or brain without contrast material. ACCESSION: [id number] [facility] CLINICAL INDICATION: 58 years old Female with altered mental status  COMPARISON: Outside imaging CT head report from 04/20/2024 (images not available for review). Per chart notes, history of left frontal mass lesion. TECHNIQUE: Axial CT images of the head from skull base to vertex without contrast. FINDINGS: Left frontal encephalomalacia.  Bilateral cerebral white matter hypodensity, nonspecific but most likely related to chronic microvascular ischemic changes. Evaluation of the bilateral temporal lobes is limited by streak artifact. There is no midline shift. No mass lesion. There is no definite evidence of acute infarct.  The sinuses are pneumatized. Postsurgical changes of left frontal craniotomy. No intracranial hemorrhage or skull fractures.   Postsurgical changes of the left frontal lobe and extensive probable background chronic microvascular ischemic changes. If the patient's prior imaging is available for review, this would be useful to evaluate for interval change in exam. If there is concern for acute intracranial pathology, recommend contrast-enhanced MRI of the brain.    Estefana Candy MD        Candy Estefana Math, MD 08/24/24 1335       [1] Past Medical History: Diagnosis Date   Cancer    (CMS-HCC)    CHF (congestive heart failure) (CMS-HCC)    Emphysema lung (CMS-HCC)   [2] No past surgical history on file. [3] No family history on file. [4]    Candy Estefana Math, MD 08/24/24 [phone]

## 2024-09-20 ENCOUNTER — Encounter: Payer: Self-pay | Admitting: Internal Medicine

## 2024-09-20 ENCOUNTER — Other Ambulatory Visit: Payer: Self-pay | Admitting: Internal Medicine
